# Patient Record
Sex: Male | Born: 1955
Health system: Southern US, Community
[De-identification: ages and names within clinical notes are randomized; demographics above are authoritative.]

## PROBLEM LIST (undated history)

## (undated) DIAGNOSIS — T1491XA Suicide attempt, initial encounter: Secondary | ICD-10-CM

## (undated) DIAGNOSIS — J449 Chronic obstructive pulmonary disease, unspecified: Secondary | ICD-10-CM

## (undated) DIAGNOSIS — Z91199 Patient's noncompliance with other medical treatment and regimen due to unspecified reason: Secondary | ICD-10-CM

## (undated) DIAGNOSIS — I472 Ventricular tachycardia, unspecified: Secondary | ICD-10-CM

## (undated) DIAGNOSIS — Z720 Tobacco use: Secondary | ICD-10-CM

## (undated) DIAGNOSIS — F1410 Cocaine abuse, uncomplicated: Secondary | ICD-10-CM

## (undated) DIAGNOSIS — I429 Cardiomyopathy, unspecified: Secondary | ICD-10-CM

## (undated) DIAGNOSIS — Z5900 Homelessness unspecified: Secondary | ICD-10-CM

## (undated) DIAGNOSIS — Z9119 Patient's noncompliance with other medical treatment and regimen: Secondary | ICD-10-CM

## (undated) DIAGNOSIS — M1990 Unspecified osteoarthritis, unspecified site: Secondary | ICD-10-CM

## (undated) DIAGNOSIS — I4729 Other ventricular tachycardia: Secondary | ICD-10-CM

## (undated) DIAGNOSIS — I509 Heart failure, unspecified: Secondary | ICD-10-CM

## (undated) DIAGNOSIS — F1010 Alcohol abuse, uncomplicated: Secondary | ICD-10-CM

## (undated) HISTORY — PX: INNER EAR SURGERY: SHX679

---

## 1999-02-10 ENCOUNTER — Encounter: Payer: Self-pay | Admitting: *Deleted

## 1999-02-10 ENCOUNTER — Emergency Department (HOSPITAL_COMMUNITY): Admission: EM | Admit: 1999-02-10 | Discharge: 1999-02-10 | Payer: Self-pay | Admitting: *Deleted

## 2002-05-10 ENCOUNTER — Emergency Department (HOSPITAL_COMMUNITY): Admission: EM | Admit: 2002-05-10 | Discharge: 2002-05-10 | Payer: Self-pay | Admitting: Emergency Medicine

## 2003-07-19 ENCOUNTER — Emergency Department (HOSPITAL_COMMUNITY): Admission: EM | Admit: 2003-07-19 | Discharge: 2003-07-20 | Payer: Self-pay | Admitting: Emergency Medicine

## 2003-08-28 ENCOUNTER — Emergency Department (HOSPITAL_COMMUNITY): Admission: EM | Admit: 2003-08-28 | Discharge: 2003-08-28 | Payer: Self-pay | Admitting: Emergency Medicine

## 2006-03-09 ENCOUNTER — Emergency Department (HOSPITAL_COMMUNITY): Admission: EM | Admit: 2006-03-09 | Discharge: 2006-03-09 | Payer: Self-pay | Admitting: Emergency Medicine

## 2006-05-19 ENCOUNTER — Emergency Department (HOSPITAL_COMMUNITY): Admission: EM | Admit: 2006-05-19 | Discharge: 2006-05-19 | Payer: Self-pay | Admitting: Emergency Medicine

## 2006-10-06 ENCOUNTER — Emergency Department (HOSPITAL_COMMUNITY): Admission: EM | Admit: 2006-10-06 | Discharge: 2006-10-06 | Payer: Self-pay | Admitting: Emergency Medicine

## 2007-09-03 ENCOUNTER — Observation Stay (HOSPITAL_COMMUNITY): Admission: EM | Admit: 2007-09-03 | Discharge: 2007-09-06 | Payer: Self-pay | Admitting: Emergency Medicine

## 2008-09-15 ENCOUNTER — Emergency Department (HOSPITAL_COMMUNITY): Admission: EM | Admit: 2008-09-15 | Discharge: 2008-09-15 | Payer: Self-pay | Admitting: Emergency Medicine

## 2008-12-07 ENCOUNTER — Inpatient Hospital Stay (HOSPITAL_COMMUNITY): Admission: EM | Admit: 2008-12-07 | Discharge: 2008-12-09 | Payer: Self-pay | Admitting: Emergency Medicine

## 2008-12-07 ENCOUNTER — Ambulatory Visit: Payer: Self-pay | Admitting: Family Medicine

## 2009-02-18 ENCOUNTER — Emergency Department (HOSPITAL_COMMUNITY): Admission: EM | Admit: 2009-02-18 | Discharge: 2009-02-20 | Payer: Self-pay | Admitting: Emergency Medicine

## 2010-04-18 ENCOUNTER — Inpatient Hospital Stay (HOSPITAL_COMMUNITY)
Admission: EM | Admit: 2010-04-18 | Discharge: 2010-04-21 | Payer: Self-pay | Source: Home / Self Care | Attending: Internal Medicine | Admitting: Internal Medicine

## 2010-04-25 LAB — URINALYSIS, ROUTINE W REFLEX MICROSCOPIC
Bilirubin Urine: NEGATIVE
Hgb urine dipstick: NEGATIVE
Protein, ur: NEGATIVE mg/dL
Specific Gravity, Urine: 1.018 (ref 1.005–1.030)
Urine Glucose, Fasting: 100 mg/dL — AB
Urobilinogen, UA: 1 mg/dL (ref 0.0–1.0)

## 2010-04-25 LAB — BASIC METABOLIC PANEL
BUN: 8 mg/dL (ref 6–23)
BUN: 10 mg/dL (ref 6–23)
CO2: 27 mEq/L (ref 19–32)
CO2: 29 mEq/L (ref 19–32)
CO2: 30 mEq/L (ref 19–32)
Calcium: 9.1 mg/dL (ref 8.4–10.5)
Calcium: 9.6 mg/dL (ref 8.4–10.5)
Chloride: 106 mEq/L (ref 96–112)
Chloride: 103 mEq/L (ref 96–112)
Chloride: 101 mEq/L (ref 96–112)
Creatinine, Ser: 0.84 mg/dL (ref 0.4–1.5)
Creatinine, Ser: 0.92 mg/dL (ref 0.4–1.5)
GFR calc Af Amer: >60 mL/min (ref >60)
GFR calc Af Amer: >60 mL/min (ref >60)
GFR calc Af Amer: >60 mL/min (ref >60)
GFR calc non Af Amer: >60 mL/min (ref >60)
Glucose, Bld: 151 mg/dL — ABNORMAL HIGH (ref 70–99)
Glucose, Bld: 123 mg/dL — ABNORMAL HIGH (ref 70–99)
Glucose, Bld: 133 mg/dL — ABNORMAL HIGH (ref 70–99)
Potassium: 4.4 mEq/L (ref 3.5–5.1)
Potassium: 4 mEq/L (ref 3.5–5.1)
Potassium: 3.9 mEq/L (ref 3.5–5.1)
Sodium: 142 mEq/L (ref 135–145)
Sodium: 141 mEq/L (ref 135–145)

## 2010-04-25 LAB — POCT CARDIAC MARKERS
CKMB, poc: 2.4 ng/mL (ref 1.0–8.0)
Myoglobin, poc: 60.4 ng/mL (ref 12–200)
Troponin i, poc: <0.05 ng/mL (ref 0.00–0.09)

## 2010-04-25 LAB — BLOOD GAS, ARTERIAL
Acid-Base Excess: 2.4 mmol/L — ABNORMAL HIGH (ref 0.0–2.0)
Bicarbonate: 27.2 mEq/L — ABNORMAL HIGH (ref 20.0–24.0)
Drawn by: 30996
O2 Content: 3 L/min
O2 Saturation: 92.3 %
Patient temperature: 98.6
TCO2: 23.6 mmol/L (ref 0–100)
pCO2 arterial: 44.8 mmHg (ref 35.0–45.0)
pH, Arterial: 7.4 (ref 7.350–7.450)
pO2, Arterial: 59.2 mmHg — ABNORMAL LOW (ref 80.0–100.0)

## 2010-04-25 LAB — COMPREHENSIVE METABOLIC PANEL
ALT: 60 U/L — ABNORMAL HIGH (ref 0–53)
AST: 52 U/L — ABNORMAL HIGH (ref 0–37)
Albumin: 3.8 g/dL (ref 3.5–5.2)
Alkaline Phosphatase: 33 U/L — ABNORMAL LOW (ref 39–117)
CO2: 28 mEq/L (ref 19–32)
Calcium: 9.2 mg/dL (ref 8.4–10.5)
Chloride: 106 mEq/L (ref 96–112)
Creatinine, Ser: 0.81 mg/dL (ref 0.4–1.5)
Potassium: 4.3 mEq/L (ref 3.5–5.1)
Sodium: 147 mEq/L — ABNORMAL HIGH (ref 135–145)
Total Bilirubin: 0.4 mg/dL (ref 0.3–1.2)
Total Protein: 6.8 g/dL (ref 6.0–8.3)

## 2010-04-25 LAB — CARDIAC PANEL(CRET KIN+CKTOT+MB+TROPI)
CK, MB: 2.8 ng/mL (ref 0.3–4.0)
CK, MB: 2.3 ng/mL (ref 0.3–4.0)
Relative Index: 2.5 (ref 0.0–2.5)
Relative Index: 2.3 (ref 0.0–2.5)
Total CK: 114 U/L (ref 7–232)
Total CK: 100 U/L (ref 7–232)
Troponin I: 0.01 ng/mL (ref 0.00–0.06)
Troponin I: 0.02 ng/mL (ref 0.00–0.06)

## 2010-04-25 LAB — CBC
HCT: 47.6 % (ref 39.0–52.0)
HCT: 44.6 % (ref 39.0–52.0)
Hemoglobin: 15.8 g/dL (ref 13.0–17.0)
Hemoglobin: 14.6 g/dL (ref 13.0–17.0)
MCH: 33.3 pg (ref 26.0–34.0)
MCH: 32.7 pg (ref 26.0–34.0)
MCHC: 33.2 g/dL (ref 30.0–36.0)
MCHC: 32.7 g/dL (ref 30.0–36.0)
MCV: 100.2 fL — ABNORMAL HIGH (ref 78.0–100.0)
MCV: 99.8 fL (ref 78.0–100.0)
Platelets: 166 10*3/uL (ref 150–400)
Platelets: 165 10*3/uL (ref 150–400)
RBC: 4.75 MIL/uL (ref 4.22–5.81)
RBC: 4.47 MIL/uL (ref 4.22–5.81)
RDW: 12.7 % (ref 11.5–15.5)
RDW: 12.6 % (ref 11.5–15.5)
WBC: 5.5 10*3/uL (ref 4.0–10.5)

## 2010-04-25 LAB — DIFFERENTIAL
Basophils Absolute: 0.1 10*3/uL (ref 0.0–0.1)
Basophils Relative: 1 % (ref 0–1)
Eosinophils Absolute: 0.1 10*3/uL (ref 0.0–0.7)
Eosinophils Relative: 1 % (ref 0–5)
Lymphocytes Relative: 29 % (ref 12–46)
Lymphs Abs: 1.6 10*3/uL (ref 0.7–4.0)
Monocytes Absolute: 0.4 10*3/uL (ref 0.1–1.0)
Monocytes Relative: 7 % (ref 3–12)
Neutro Abs: 3.4 10*3/uL (ref 1.7–7.7)
Neutrophils Relative %: 62 % (ref 43–77)

## 2010-04-25 LAB — CK TOTAL AND CKMB (NOT AT ARMC)
CK, MB: 3.7 ng/mL (ref 0.3–4.0)
CK, MB: 2.7 ng/mL (ref 0.3–4.0)
Relative Index: 2.6 — ABNORMAL HIGH (ref 0.0–2.5)
Relative Index: 2.4 (ref 0.0–2.5)
Total CK: 114 U/L (ref 7–232)

## 2010-04-25 LAB — TROPONIN I
Troponin I: 0.02 ng/mL (ref 0.00–0.06)
Troponin I: 0.02 ng/mL (ref 0.00–0.06)

## 2010-04-30 NOTE — Progress Notes (Addendum)
  NAME:  Brian [name], Brian [name] NO.:  [id number]  MEDICAL RECORD NO.:  [id number]          PATIENT TYPE:  INP  LOCATION:  1423                         FACILITY:  [facility]  PHYSICIAN:  Homero Fellers, MD   DATE OF BIRTH:  1956/01/22                                PROGRESS NOTE   CHIEF COMPLAINT: Shortness of breath is improved.  No chest pain.  SUBJECTIVE: In summary, this is a 55 year old man who was admitted yesterday with COPD exacerbation, toxic respiratory failure and impending alcohol withdrawal.  Today, the patient does not have any worsening of his tremors.  No hallucination, nausea or vomiting.  OBJECTIVE: VITAL SIGNS:  His vital signs showed blood pressure of 164/93, pulse 102, respirations 18, temperature is 98.6.  O2 sat is 96% on 2 L. GENERAL:  The patient seems comfortable, in no distress. HEENT:  Pallor.  Extraocular muscles are intact. NECK:  Supple.  No JVD, adenopathy or thyromegaly. LUNGS:  Showed reduced breath sounds bilaterally with very minimal wheezing.  S1, S2.  No murmur, rubs, or gallop. ABDOMEN:  Obese, soft, nontender.  Bowel sounds present.  No masses. EXTREMITIES:  No edema.  LABORATORY DATA: White count is 7.9, hemoglobin 14.6, platelet count is 165. Chemistries; sodium is 141, potassium 4, BUN 11, creatinine 0.84. Cardiac panel negative x3.  Chest x-ray this morning showed increase opacity at the right lung base which may suggest pneumonia or aspiration pneumonitis with increase linear atelectasis at the left lung base.  ASSESSMENT: 1. Chronic obstructive pulmonary disease exacerbation versus right     lower lobe pneumonia which could be community acquired or     aspiration pneumonitis.  The patient is already on antibiotics     which should be continued. 2. Alcohol abuse with no overt withdrawal at this time.  He is on CIWA     protocol.  His blood pressure is mildly elevated as well as his     heart rate but he does have  worsening of his tremor. 3. Chest pain which is resolved, likely related to pneumonitis.  PLAN: Plan is to continue on antibiotics and steroids if oxygen saturation is acceptable.  If he continues to improve, he can probably be discharged home in 1 to 2 days on p.o. antibiotics for at least 5 additional days.  Meanwhile, he is continued on CIWA protocol at this time.  His condition is stable.     Homero Fellers, MD     FA/MEDQ  D:  04/19/2010  T:  04/20/2010  Job:  [id number]  Electronically Signed by Homero Fellers  on 04/30/2010 10:52:39 PM

## 2010-05-10 ENCOUNTER — Emergency Department (HOSPITAL_COMMUNITY)
Admission: EM | Admit: 2010-05-10 | Discharge: 2010-05-10 | Payer: Self-pay | Source: Home / Self Care | Admitting: Emergency Medicine

## 2010-05-10 LAB — RAPID URINE DRUG SCREEN, HOSP PERFORMED
Cocaine: NOT DETECTED
Tetrahydrocannabinol: NOT DETECTED

## 2010-05-10 LAB — CBC
MCV: 95 fL (ref 78.0–100.0)
Platelets: 221 10*3/uL (ref 150–400)
RBC: 4.64 MIL/uL (ref 4.22–5.81)
WBC: 6.9 10*3/uL (ref 4.0–10.5)

## 2010-05-10 LAB — DIFFERENTIAL
Basophils Absolute: 0 10*3/uL (ref 0.0–0.1)
Eosinophils Absolute: 0.1 10*3/uL (ref 0.0–0.7)
Lymphs Abs: 2.8 10*3/uL (ref 0.7–4.0)
Neutrophils Relative %: 51 % (ref 43–77)

## 2010-05-11 ENCOUNTER — Inpatient Hospital Stay (HOSPITAL_COMMUNITY)
Admission: AD | Admit: 2010-05-11 | Discharge: 2010-05-12 | DRG: 897 | Disposition: A | Discharge status: Home / Self Care | Payer: PRIVATE HEALTH INSURANCE | Source: Ambulatory Visit | Attending: Psychiatry | Admitting: Psychiatry

## 2010-05-11 DIAGNOSIS — F1020 Alcohol dependence, uncomplicated: Principal | ICD-10-CM

## 2010-05-11 DIAGNOSIS — F1920 Other psychoactive substance dependence, uncomplicated: Secondary | ICD-10-CM

## 2010-05-11 DIAGNOSIS — F1220 Cannabis dependence, uncomplicated: Secondary | ICD-10-CM

## 2010-05-11 DIAGNOSIS — F1420 Cocaine dependence, uncomplicated: Secondary | ICD-10-CM

## 2010-05-11 DIAGNOSIS — F1910 Other psychoactive substance abuse, uncomplicated: Secondary | ICD-10-CM

## 2010-05-11 DIAGNOSIS — Z5900 Homelessness unspecified: Secondary | ICD-10-CM

## 2010-05-11 DIAGNOSIS — Z560 Unemployment, unspecified: Secondary | ICD-10-CM

## 2010-05-12 ENCOUNTER — Emergency Department (HOSPITAL_COMMUNITY)
Admission: EM | Admit: 2010-05-12 | Discharge: 2010-05-13 | Disposition: A | Discharge status: Home / Self Care | Payer: PRIVATE HEALTH INSURANCE | Attending: Emergency Medicine | Admitting: Emergency Medicine

## 2010-05-12 ENCOUNTER — Emergency Department (HOSPITAL_COMMUNITY): Payer: PRIVATE HEALTH INSURANCE

## 2010-05-12 ENCOUNTER — Emergency Department (HOSPITAL_COMMUNITY): Admit: 2010-05-12 | Payer: PRIVATE HEALTH INSURANCE

## 2010-05-12 DIAGNOSIS — Z5900 Homelessness unspecified: Secondary | ICD-10-CM | POA: Insufficient documentation

## 2010-05-12 DIAGNOSIS — Z046 Encounter for general psychiatric examination, requested by authority: Secondary | ICD-10-CM | POA: Insufficient documentation

## 2010-05-12 DIAGNOSIS — W1800XA Striking against unspecified object with subsequent fall, initial encounter: Secondary | ICD-10-CM

## 2010-05-12 DIAGNOSIS — W1809XA Striking against other object with subsequent fall, initial encounter: Secondary | ICD-10-CM | POA: Insufficient documentation

## 2010-05-12 DIAGNOSIS — S59909A Unspecified injury of unspecified elbow, initial encounter: Secondary | ICD-10-CM | POA: Insufficient documentation

## 2010-05-12 DIAGNOSIS — M25539 Pain in unspecified wrist: Secondary | ICD-10-CM | POA: Insufficient documentation

## 2010-05-12 DIAGNOSIS — S6990XA Unspecified injury of unspecified wrist, hand and finger(s), initial encounter: Secondary | ICD-10-CM | POA: Insufficient documentation

## 2010-05-12 LAB — CBC
HCT: 48 % (ref 39.0–52.0)
MCH: 32.9 pg (ref 26.0–34.0)
MCV: 95.8 fL (ref 78.0–100.0)
Platelets: 200 10*3/uL (ref 150–400)
RDW: 12.7 % (ref 11.5–15.5)

## 2010-05-12 LAB — URINALYSIS, ROUTINE W REFLEX MICROSCOPIC
Nitrite: NEGATIVE
Specific Gravity, Urine: 1.006 (ref 1.005–1.030)
pH: 5.5 (ref 5.0–8.0)

## 2010-05-12 LAB — RAPID URINE DRUG SCREEN, HOSP PERFORMED
Barbiturates: NOT DETECTED
Cocaine: NOT DETECTED
Opiates: NOT DETECTED

## 2010-05-12 LAB — COMPREHENSIVE METABOLIC PANEL
ALT: 43 U/L (ref 0–53)
AST: 31 U/L (ref 0–37)
Albumin: 4.2 g/dL (ref 3.5–5.2)
CO2: 26 mEq/L (ref 19–32)
Chloride: 105 mEq/L (ref 96–112)
GFR calc Af Amer: >60 mL/min (ref >60)
GFR calc non Af Amer: >60 mL/min (ref >60)
Sodium: 140 mEq/L (ref 135–145)
Total Bilirubin: 0.6 mg/dL (ref 0.3–1.2)

## 2010-05-12 LAB — DIFFERENTIAL
Eosinophils Absolute: 0.1 10*3/uL (ref 0.0–0.7)
Eosinophils Relative: 2 % (ref 0–5)
Lymphocytes Relative: 34 % (ref 12–46)
Lymphs Abs: 2.1 10*3/uL (ref 0.7–4.0)
Monocytes Relative: 12 % (ref 3–12)

## 2010-05-12 LAB — ETHANOL: Alcohol, Ethyl (B): 203 mg/dL — ABNORMAL HIGH (ref 0–10)

## 2010-05-13 ENCOUNTER — Emergency Department (HOSPITAL_COMMUNITY): Payer: PRIVATE HEALTH INSURANCE
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
## 2010-07-13 LAB — URINALYSIS, ROUTINE W REFLEX MICROSCOPIC
Glucose, UA: NEGATIVE mg/dL
Nitrite: NEGATIVE
Specific Gravity, Urine: 1.009 (ref 1.005–1.030)
pH: 5 (ref 5.0–8.0)

## 2010-07-13 LAB — COMPREHENSIVE METABOLIC PANEL
ALT: 36 U/L (ref 0–53)
Calcium: 9.5 mg/dL (ref 8.4–10.5)
Creatinine, Ser: 0.98 mg/dL (ref 0.4–1.5)
GFR calc Af Amer: >60 mL/min (ref >60)
GFR calc non Af Amer: >60 mL/min (ref >60)
Glucose, Bld: 93 mg/dL (ref 70–99)
Sodium: 144 mEq/L (ref 135–145)
Total Protein: 7.2 g/dL (ref 6.0–8.3)

## 2010-07-13 LAB — LIPASE, BLOOD: Lipase: 27 U/L (ref 11–59)

## 2010-07-13 LAB — RAPID URINE DRUG SCREEN, HOSP PERFORMED
Barbiturates: NOT DETECTED
Benzodiazepines: NOT DETECTED
Cocaine: POSITIVE — AB
Opiates: NOT DETECTED
Tetrahydrocannabinol: NOT DETECTED

## 2010-07-13 LAB — CBC
Hemoglobin: 16.4 g/dL (ref 13.0–17.0)
MCHC: 35.2 g/dL (ref 30.0–36.0)
MCV: 94.5 fL (ref 78.0–100.0)
RDW: 13.3 % (ref 11.5–15.5)

## 2010-07-13 LAB — DIFFERENTIAL
Eosinophils Absolute: 0.1 10*3/uL (ref 0.0–0.7)
Lymphocytes Relative: 34 % (ref 12–46)
Lymphs Abs: 2.3 10*3/uL (ref 0.7–4.0)
Monocytes Relative: 7 % (ref 3–12)
Neutro Abs: 3.9 10*3/uL (ref 1.7–7.7)
Neutrophils Relative %: 57 % (ref 43–77)

## 2010-07-13 LAB — ETHANOL: Alcohol, Ethyl (B): <5 mg/dL (ref 0–10)

## 2010-07-16 LAB — COMPREHENSIVE METABOLIC PANEL
ALT: 32 U/L (ref 0–53)
AST: 27 U/L (ref 0–37)
Albumin: 4.1 g/dL (ref 3.5–5.2)
Alkaline Phosphatase: 35 U/L — ABNORMAL LOW (ref 39–117)
Alkaline Phosphatase: 36 U/L — ABNORMAL LOW (ref 39–117)
BUN: 24 mg/dL — ABNORMAL HIGH (ref 6–23)
CO2: 22 mEq/L (ref 19–32)
Chloride: 100 mEq/L (ref 96–112)
Chloride: 103 mEq/L (ref 96–112)
Creatinine, Ser: 1.11 mg/dL (ref 0.4–1.5)
GFR calc Af Amer: >60 mL/min (ref >60)
GFR calc non Af Amer: >60 mL/min (ref >60)
Potassium: 4.5 mEq/L (ref 3.5–5.1)
Total Bilirubin: 0.7 mg/dL (ref 0.3–1.2)
Total Bilirubin: 1.5 mg/dL — ABNORMAL HIGH (ref 0.3–1.2)

## 2010-07-16 LAB — CARDIAC PANEL(CRET KIN+CKTOT+MB+TROPI)
CK, MB: 3.3 ng/mL (ref 0.3–4.0)
Relative Index: 1.2 (ref 0.0–2.5)
Relative Index: 1.3 (ref 0.0–2.5)
Total CK: 256 U/L — ABNORMAL HIGH (ref 7–232)
Troponin I: 0.01 ng/mL (ref 0.00–0.06)
Troponin I: 0.02 ng/mL (ref 0.00–0.06)

## 2010-07-16 LAB — DIFFERENTIAL
Basophils Absolute: 0 10*3/uL (ref 0.0–0.1)
Basophils Absolute: 0.1 10*3/uL (ref 0.0–0.1)
Basophils Relative: 1 % (ref 0–1)
Basophils Relative: 1 % (ref 0–1)
Eosinophils Absolute: 0 10*3/uL (ref 0.0–0.7)
Eosinophils Absolute: 0.2 10*3/uL (ref 0.0–0.7)
Eosinophils Relative: 0 % (ref 0–5)
Eosinophils Relative: 2 % (ref 0–5)
Lymphocytes Relative: 24 % (ref 12–46)
Monocytes Absolute: 1 10*3/uL (ref 0.1–1.0)
Neutro Abs: 5.6 10*3/uL (ref 1.7–7.7)

## 2010-07-16 LAB — CBC
HCT: 46.6 % (ref 39.0–52.0)
HCT: 49.5 % (ref 39.0–52.0)
Hemoglobin: 16 g/dL (ref 13.0–17.0)
MCV: 95.8 fL (ref 78.0–100.0)
Platelets: 181 10*3/uL (ref 150–400)
RBC: 5.17 MIL/uL (ref 4.22–5.81)
WBC: 10.4 10*3/uL (ref 4.0–10.5)
WBC: 9.5 10*3/uL (ref 4.0–10.5)

## 2010-07-16 LAB — RAPID URINE DRUG SCREEN, HOSP PERFORMED
Amphetamines: NOT DETECTED
Barbiturates: NOT DETECTED

## 2010-07-16 LAB — D-DIMER, QUANTITATIVE: D-Dimer, Quant: 0.28 ug/mL-FEU (ref 0.00–0.48)

## 2010-07-16 LAB — POCT CARDIAC MARKERS
CKMB, poc: 2.9 ng/mL (ref 1.0–8.0)
Myoglobin, poc: 152 ng/mL (ref 12–200)
Troponin i, poc: <0.05 ng/mL (ref 0.00–0.09)
Troponin i, poc: <0.05 ng/mL (ref 0.00–0.09)

## 2010-07-16 LAB — LIPID PANEL: Triglycerides: 65 mg/dL (ref <150)

## 2010-07-16 LAB — APTT: aPTT: 29 seconds (ref 24–37)

## 2010-08-23 NOTE — Discharge Summary (Signed)
NAME:  Brian [name], ARPINO NO.:  [id number]   MEDICAL RECORD NO.:  [id number]          PATIENT TYPE:  OBV   LOCATION:  5511                         FACILITY:  [facility]   PHYSICIAN:  Leighton Roach McDiarmid, M.D.DATE OF BIRTH:  05-03-1955   DATE OF ADMISSION:  12/07/2008  DATE OF DISCHARGE:  12/09/2008                               DISCHARGE SUMMARY   PRIMARY CARE PHYSICIAN:  Unassigned.   REASON FOR ADMISSION:  Chest pain.   PERTINENT ADMIT LABS:  CMET 138, potassium 3.6, chloride 103, bicarb 22,  BUN 28, creatinine 1.11, and glucose 122.  Point-of-care cardiac enzymes  negative x2.  Urine drug screen, positive for cocaine.   PRIMARY DISCHARGE DIAGNOSES:  1. Cocaine Associated Chest Pain  2. Cocaine abuse.  3. Alcohol Dependence, continuous   HOME MEDICATIONS:  None.   NEW MEDICATIONS AT THE TIME OF DISCHARGE:  None.   PROBLEMS:  1. Chest pain.  The patient describes chest pain is soreness.  Able to      reproduce pain with palpation of area.  On day of discharge, pain      continued in left chest and left upper back.  EKGs within normal      limits on August 31, no evidence of MI.  Cardiac enzymes x3, during      admission, within normal limits.  Atypical chest pain, most likely      secondary to musculoskeletal injury.  No further workup needed for      chest pain at this time.  2. ETOH and cocaine abuse.  Social work consult obtained to help with      these issues, and discussed the importance of no longer using ETOH      or cocaine.  Admitting MD also discussed concerns and the need to      stop the use of these substances, with the patient.  3. Social issues.  The patient also homeless.  Social work also      consulted to give the patient numbers of places      that are safe to go for shelter.  4. On day of discharge, the patient refused to wait for discharge      paperwork to be completed and left the hospital AMA.  Unable to      establish the patient with  primary care physician or go over      discharge paperwork.      Ellin Mayhew, MD  Electronically Signed      Leighton Roach McDiarmid, M.D.  Electronically Signed    DC/MEDQ  D:  12/09/2008  T:  12/10/2008  Job:  [id number]
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
## 2010-11-11 ENCOUNTER — Emergency Department (HOSPITAL_COMMUNITY)
Admission: EM | Admit: 2010-11-11 | Discharge: 2010-11-12 | Disposition: A | Discharge status: Home / Self Care | Payer: Self-pay | Attending: Emergency Medicine | Admitting: Emergency Medicine

## 2010-11-11 DIAGNOSIS — F3289 Other specified depressive episodes: Secondary | ICD-10-CM | POA: Insufficient documentation

## 2010-11-11 DIAGNOSIS — F329 Major depressive disorder, single episode, unspecified: Secondary | ICD-10-CM | POA: Insufficient documentation

## 2010-11-11 DIAGNOSIS — F1010 Alcohol abuse, uncomplicated: Secondary | ICD-10-CM | POA: Insufficient documentation

## 2010-11-11 LAB — COMPREHENSIVE METABOLIC PANEL
ALT: 41 U/L (ref 0–53)
AST: 38 U/L — ABNORMAL HIGH (ref 0–37)
CO2: 27 mEq/L (ref 19–32)
Calcium: 9.6 mg/dL (ref 8.4–10.5)
Chloride: 100 mEq/L (ref 96–112)
GFR calc Af Amer: >60 mL/min (ref >60)
GFR calc non Af Amer: >60 mL/min (ref >60)
Glucose, Bld: 100 mg/dL — ABNORMAL HIGH (ref 70–99)
Sodium: 140 mEq/L (ref 135–145)
Total Bilirubin: 0.3 mg/dL (ref 0.3–1.2)

## 2010-11-11 LAB — RAPID URINE DRUG SCREEN, HOSP PERFORMED
Barbiturates: NOT DETECTED
Benzodiazepines: NOT DETECTED
Cocaine: POSITIVE — AB

## 2010-11-11 LAB — CBC
MCHC: 35.4 g/dL (ref 30.0–36.0)
MCV: 91.4 fL (ref 78.0–100.0)
Platelets: 208 10*3/uL (ref 150–400)
RDW: 13.1 % (ref 11.5–15.5)
WBC: 7.2 10*3/uL (ref 4.0–10.5)

## 2010-11-11 LAB — URINALYSIS, ROUTINE W REFLEX MICROSCOPIC
Bilirubin Urine: NEGATIVE
Ketones, ur: NEGATIVE mg/dL
Leukocytes, UA: NEGATIVE
Nitrite: NEGATIVE
Protein, ur: NEGATIVE mg/dL

## 2010-11-11 LAB — DIFFERENTIAL
Basophils Absolute: 0 10*3/uL (ref 0.0–0.1)
Eosinophils Absolute: 0.1 10*3/uL (ref 0.0–0.7)
Eosinophils Relative: 1 % (ref 0–5)
Monocytes Absolute: 0.5 10*3/uL (ref 0.1–1.0)

## 2011-01-04 LAB — URINE MICROSCOPIC-ADD ON

## 2011-01-04 LAB — URINALYSIS, ROUTINE W REFLEX MICROSCOPIC
Bilirubin Urine: NEGATIVE
Glucose, UA: NEGATIVE
Ketones, ur: NEGATIVE
Protein, ur: 100 — AB
Urobilinogen, UA: 0.2

## 2011-01-04 LAB — POCT I-STAT, CHEM 8
BUN: 15
Chloride: 110
Creatinine, Ser: 1.2
Potassium: 4.9
Sodium: 144
TCO2: 23

## 2011-01-04 LAB — CBC
HCT: 47.1
Hemoglobin: 16.1
RBC: 4.99
RDW: 13.5
WBC: 19.8 — ABNORMAL HIGH

## 2011-01-04 LAB — PROTIME-INR: INR: 0.9

## 2011-01-24 ENCOUNTER — Emergency Department (HOSPITAL_COMMUNITY): Payer: Self-pay

## 2011-01-24 ENCOUNTER — Observation Stay (HOSPITAL_COMMUNITY)
Admission: EM | Admit: 2011-01-24 | Discharge: 2011-01-26 | Discharge status: Against Medical Advice | Payer: Self-pay | Attending: Internal Medicine | Admitting: Internal Medicine

## 2011-01-24 DIAGNOSIS — Z5900 Homelessness unspecified: Secondary | ICD-10-CM | POA: Insufficient documentation

## 2011-01-24 DIAGNOSIS — F17200 Nicotine dependence, unspecified, uncomplicated: Secondary | ICD-10-CM | POA: Insufficient documentation

## 2011-01-24 DIAGNOSIS — R0789 Other chest pain: Principal | ICD-10-CM | POA: Insufficient documentation

## 2011-01-24 DIAGNOSIS — F1010 Alcohol abuse, uncomplicated: Secondary | ICD-10-CM | POA: Insufficient documentation

## 2011-01-24 DIAGNOSIS — F1420 Cocaine dependence, uncomplicated: Secondary | ICD-10-CM | POA: Insufficient documentation

## 2011-01-24 LAB — DIFFERENTIAL
Basophils Relative: 0 % (ref 0–1)
Eosinophils Absolute: 0.1 10*3/uL (ref 0.0–0.7)
Lymphs Abs: 3.2 10*3/uL (ref 0.7–4.0)
Neutro Abs: 5.5 10*3/uL (ref 1.7–7.7)
Neutrophils Relative %: 58 % (ref 43–77)

## 2011-01-24 LAB — URINALYSIS, ROUTINE W REFLEX MICROSCOPIC
Glucose, UA: NEGATIVE mg/dL
Hgb urine dipstick: NEGATIVE
Leukocytes, UA: NEGATIVE
Protein, ur: NEGATIVE mg/dL
Specific Gravity, Urine: 1.004 — ABNORMAL LOW (ref 1.005–1.030)
Urobilinogen, UA: 0.2 mg/dL (ref 0.0–1.0)

## 2011-01-24 LAB — BASIC METABOLIC PANEL
BUN: 14 mg/dL (ref 6–23)
CO2: 23 mEq/L (ref 19–32)
Calcium: 9.4 mg/dL (ref 8.4–10.5)
Chloride: 102 mEq/L (ref 96–112)
Creatinine, Ser: 0.76 mg/dL (ref 0.50–1.35)
Glucose, Bld: 91 mg/dL (ref 70–99)

## 2011-01-24 LAB — CBC
Hemoglobin: 16.9 g/dL (ref 13.0–17.0)
Platelets: 207 10*3/uL (ref 150–400)
RBC: 5.14 MIL/uL (ref 4.22–5.81)
WBC: 9.4 10*3/uL (ref 4.0–10.5)

## 2011-01-24 LAB — POCT I-STAT TROPONIN I: Troponin i, poc: 0.01 ng/mL (ref 0.00–0.08)

## 2011-01-24 LAB — PROTIME-INR
INR: 0.89 (ref 0.00–1.49)
Prothrombin Time: 12.2 seconds (ref 11.6–15.2)

## 2011-01-24 LAB — RAPID URINE DRUG SCREEN, HOSP PERFORMED: Opiates: NOT DETECTED

## 2011-01-25 DIAGNOSIS — R072 Precordial pain: Secondary | ICD-10-CM

## 2011-01-25 LAB — COMPREHENSIVE METABOLIC PANEL
ALT: 31
AST: 22 U/L (ref 0–37)
Albumin: 3.9 g/dL (ref 3.5–5.2)
Alkaline Phosphatase: 36 U/L — ABNORMAL LOW (ref 39–117)
Alkaline Phosphatase: 36 — ABNORMAL LOW
BUN: 13 mg/dL (ref 6–23)
BUN: 12
CO2: 24 mEq/L (ref 19–32)
CO2: 24
Chloride: 107 mEq/L (ref 96–112)
Chloride: 105
Creatinine, Ser: 0.73 mg/dL (ref 0.50–1.35)
GFR calc non Af Amer: >90 mL/min (ref >90)
GFR calc non Af Amer: >60
Glucose, Bld: 78
Potassium: 4.3 mEq/L (ref 3.5–5.1)
Potassium: 4.6
Sodium: 140
Total Bilirubin: 0.3 mg/dL (ref 0.3–1.2)
Total Bilirubin: 1.3 — ABNORMAL HIGH
Total Protein: 6.9

## 2011-01-25 LAB — LIPID PANEL
Cholesterol: 156 mg/dL (ref 0–200)
LDL Cholesterol: 78 mg/dL (ref 0–99)
Triglycerides: 177 mg/dL — ABNORMAL HIGH (ref <150)
VLDL: 35 mg/dL (ref 0–40)

## 2011-01-25 LAB — DIFFERENTIAL
Basophils Absolute: 0
Basophils Relative: 1
Eosinophils Absolute: 0.1
Monocytes Relative: 7
Neutrophils Relative %: 65

## 2011-01-25 LAB — RAPID URINE DRUG SCREEN, HOSP PERFORMED
Opiates: NOT DETECTED
Tetrahydrocannabinol: POSITIVE — AB

## 2011-01-25 LAB — CBC
HCT: 51
Hemoglobin: 15.9 g/dL (ref 13.0–17.0)
Hemoglobin: 17.8 — ABNORMAL HIGH
MCH: 31.5 pg (ref 26.0–34.0)
MCV: 92.3 fL (ref 78.0–100.0)
Platelets: 187 10*3/uL (ref 150–400)
RBC: 5.05 MIL/uL (ref 4.22–5.81)
RBC: 5.62
WBC: 6.8 10*3/uL (ref 4.0–10.5)
WBC: 7.6

## 2011-01-25 LAB — CARDIAC PANEL(CRET KIN+CKTOT+MB+TROPI)
CK, MB: 4.1 ng/mL — ABNORMAL HIGH (ref 0.3–4.0)
CK, MB: 3.4 ng/mL (ref 0.3–4.0)
Relative Index: 2.2 (ref 0.0–2.5)
Total CK: 182 U/L (ref 7–232)
Total CK: 154 U/L (ref 7–232)
Troponin I: <0.3 ng/mL (ref <0.30)

## 2011-01-25 LAB — PHOSPHORUS: Phosphorus: 2.9 mg/dL (ref 2.3–4.6)

## 2011-01-25 LAB — ETHANOL: Alcohol, Ethyl (B): 158 — ABNORMAL HIGH

## 2011-01-26 LAB — BASIC METABOLIC PANEL
Chloride: 104 mEq/L (ref 96–112)
GFR calc non Af Amer: >90 mL/min (ref >90)
Sodium: 140 mEq/L (ref 135–145)

## 2011-01-26 LAB — CBC
MCH: 32 pg (ref 26.0–34.0)
MCV: 91.7 fL (ref 78.0–100.0)
Platelets: 179 10*3/uL (ref 150–400)
RDW: 12.5 % (ref 11.5–15.5)
WBC: 6.7 10*3/uL (ref 4.0–10.5)

## 2011-01-27 LAB — HEMOGLOBIN A1C
Hgb A1c MFr Bld: 5.6 % (ref <5.7)
Mean Plasma Glucose: 114 mg/dL (ref <117)

## 2011-01-31 NOTE — Discharge Summary (Signed)
  NAMECALEL, PISARSKI NO.:  [id number]  MEDICAL RECORD NO.:  [id number]  LOCATION:  5123                         FACILITY:  [facility]  PHYSICIAN:  Lonia Blood, M.D.       DATE OF BIRTH:  [birth year]  DATE OF ADMISSION:  01/24/2011 DATE OF DISCHARGE:  01/26/2011                              DISCHARGE SUMMARY   DISCHARGE DIAGNOSES: 1. Chest pain - atypical - the patient ruled out for myocardial     infarction. 2. Alcohol abuse.  DISCHARGE MEDICATIONS:  No medications could be prescribed as the patient left against medical advice.  HOSPITAL COURSE:  Mr. Bruna is a 55 year old gentleman with history of heavy alcohol abuse came to emergency room complaining of some left shoulder pain and chest pain.  His EKG was nonacute.  The patient was observed overnight on telemetry and his cardiac enzymes were normal.  He was then asked about his interest in stopping alcohol.  The patient voiced a strong desire to quit drinking.  On day #2 of his alcohol detoxification protocol, he decided that he cannot take the torture anymore and he left the hospital.  He was at the time of his leaving the hospital alert and oriented, and he understood the consequences of his decision.     Lonia Blood, M.D.     SL/MEDQ  D:  01/29/2011  T:  01/29/2011  Job:  [id number]  Electronically Signed by Lonia Blood M.D. on 01/31/2011 07:40:21 AM

## 2011-02-10 NOTE — H&P (Signed)
**Note Brian via Obfuscation** [name], BEGUE NO.:  [id number]  MEDICAL RECORD NO.:  [id number]  LOCATION:  3707                         FACILITY:  [facility]  PHYSICIAN:  Lonia Blood, M.D.      DATE OF BIRTH:  12-25-1955  DATE OF ADMISSION:  01/24/2011 DATE OF DISCHARGE:                             HISTORY & PHYSICAL   PRIMARY CARE PHYSICIAN:  He is unassigned.  PRESENTING COMPLAINT:  Chest pain.  HISTORY OF PRESENT ILLNESS:  [name] who is alcoholic and homeless.  The patient was brought in to the ED complaining of chest pain.  He has inebriated and intoxicated with alcohol.  The patient reported doing cocaine yesterday, but his urine drug screen so far is negative here.  He complaining of left-sided chest pain that he rated as 9/10, radiating down his left arm.  Denied any relieving or aggravating factor.  He had some nausea and vomiting today and feels short of breath.  No cough.  No fever.  [name].  [place]  [name], although no records quite clear are available to [place] at this point.  He did have psychiatric evaluation in the system.  PAST MEDICAL HISTORY:  Significant for depression, suicide attempt, polysubstance abuse, and asthma.  ALLERGIES:  No known drug allergies.  MEDICATIONS:  None.  SOCIAL HISTORY:  [name].  He uses cocaine, heavy alcohol, heavy smoker.  Denied IV drug use.  FAMILY HISTORY:  Denied any significant family history including coronary artery disease.  REVIEW OF SYSTEMS:  All systems are negative except per HPI.  PHYSICAL EXAMINATION:  VITAL SIGNS:  Temperature 98.7, blood pressure 108/79, pulse is 97, respiratory rate of 17, O2 sats 95% on room air. GENERAL:  [name], but in no acute distress. HEENT:  PERRL.  EOMI.  No  significant pallor.  No jaundice.  No rhinorrhea. NECK:  Supple.  No JVD.  No lymphadenopathy. RESPIRATORY:  He has good air entry bilaterally.  No wheezes, no rales, no crackles. CARDIOVASCULAR:  He has S1 and S2.  No murmur. ABDOMEN:  Soft, full, nontender with positive bowel sounds. EXTREMITIES:  No edema, cyanosis, or clubbing. SKIN:  No rashes, no ulcers.  LABORATORY DATA:  White count 9.4, hemoglobin 16.9 with platelet of 207, normal differentials.  Urine drug screen is negative.  Urinalysis is essentially negative.  Sodium 138, potassium 4.0, chloride 102, CO2 of 23, glucose 91, BUN 14, creatinine 0.76, with calcium 9.4.  His alcohol level is 240.  Chest x-ray showed no acute cardiopulmonary process. There is bilateral mid and lower lung linear heterogeneous opacities, which is consistent with subsegmental atelectasis or scar.  His EKG showed normal sinus rhythm with nonspecific ST-T wave changes, rate of 97.  ASSESSMENT:  This is a 55 year old gentleman here with alcohol intoxication, chest pain with some risk factors for possible coronary artery disease.  PLAN: 1. Chest pain.  Admit the patient for observation, check serial     cardiac enzymes.  Given some nitro  as needed.  The most important     thing is worried about possible cocaine-induced chest pain,     however, his urine drug screen so far is negative for cocaine.  If     enzymes are all negative, the patient is probably at low to medium     risk and may not even require stress test or less will get more     records indicating that he truly had prior chest pain or coronary     artery disease. 2. Alcohol intoxication.  We will watch for possible DT.  In the     meantime, I will put him on Ativan empirically, thiamine, folic     acid, and watch for DT. 3. Polysubstance abuse including tobacco.  I will put him on nicotine     patch.  The patient will need some tobacco cessation counseling. 4. History of asthma.  He  has no wheezing and has not been using any     inhalers.  We will empirically have nebulizers p.r.n. in the     hospital. 5. Depression and anxiety.  Again, the patient is not taking any     medicine largely because he is homeless.  If needed, we will get     Psychiatry to evaluate the patient if he shows any more signs of     depression. 6. Homelessness.  We will get social worker to consult on the patient     and see if the patient may qualify for some shelter.     Lonia Blood, M.D.     Verlin Grills  D:  01/25/2011  T:  01/25/2011  Job:  [id number]  Electronically Signed by Lonia Blood M.D. on 02/10/2011 05:54:28 AM

## 2011-03-19 ENCOUNTER — Emergency Department (HOSPITAL_COMMUNITY): Payer: Self-pay

## 2011-03-19 ENCOUNTER — Other Ambulatory Visit: Payer: Self-pay

## 2011-03-19 ENCOUNTER — Emergency Department (HOSPITAL_COMMUNITY)
Admission: EM | Admit: 2011-03-19 | Discharge: 2011-03-19 | Disposition: A | Discharge status: Home / Self Care | Payer: Self-pay | Attending: Emergency Medicine | Admitting: Emergency Medicine

## 2011-03-19 ENCOUNTER — Encounter: Payer: Self-pay | Admitting: Emergency Medicine

## 2011-03-19 DIAGNOSIS — M545 Low back pain, unspecified: Secondary | ICD-10-CM | POA: Insufficient documentation

## 2011-03-19 DIAGNOSIS — M542 Cervicalgia: Secondary | ICD-10-CM | POA: Insufficient documentation

## 2011-03-19 DIAGNOSIS — M25559 Pain in unspecified hip: Secondary | ICD-10-CM | POA: Insufficient documentation

## 2011-03-19 DIAGNOSIS — R071 Chest pain on breathing: Secondary | ICD-10-CM | POA: Insufficient documentation

## 2011-03-19 DIAGNOSIS — R05 Cough: Secondary | ICD-10-CM | POA: Insufficient documentation

## 2011-03-19 DIAGNOSIS — M79609 Pain in unspecified limb: Secondary | ICD-10-CM | POA: Insufficient documentation

## 2011-03-19 DIAGNOSIS — M5416 Radiculopathy, lumbar region: Secondary | ICD-10-CM

## 2011-03-19 DIAGNOSIS — IMO0002 Reserved for concepts with insufficient information to code with codable children: Secondary | ICD-10-CM | POA: Insufficient documentation

## 2011-03-19 DIAGNOSIS — F10229 Alcohol dependence with intoxication, unspecified: Secondary | ICD-10-CM | POA: Insufficient documentation

## 2011-03-19 DIAGNOSIS — R209 Unspecified disturbances of skin sensation: Secondary | ICD-10-CM | POA: Insufficient documentation

## 2011-03-19 DIAGNOSIS — G8929 Other chronic pain: Secondary | ICD-10-CM | POA: Insufficient documentation

## 2011-03-19 DIAGNOSIS — R079 Chest pain, unspecified: Secondary | ICD-10-CM | POA: Insufficient documentation

## 2011-03-19 DIAGNOSIS — R059 Cough, unspecified: Secondary | ICD-10-CM | POA: Insufficient documentation

## 2011-03-19 DIAGNOSIS — M549 Dorsalgia, unspecified: Secondary | ICD-10-CM

## 2011-03-19 DIAGNOSIS — F1010 Alcohol abuse, uncomplicated: Secondary | ICD-10-CM

## 2011-03-19 DIAGNOSIS — F17200 Nicotine dependence, unspecified, uncomplicated: Secondary | ICD-10-CM | POA: Insufficient documentation

## 2011-03-19 DIAGNOSIS — F1020 Alcohol dependence, uncomplicated: Secondary | ICD-10-CM

## 2011-03-19 LAB — COMPREHENSIVE METABOLIC PANEL
AST: 36 U/L (ref 0–37)
Albumin: 4.2 g/dL (ref 3.5–5.2)
Chloride: 103 mEq/L (ref 96–112)
Creatinine, Ser: 0.74 mg/dL (ref 0.50–1.35)
Potassium: 3.9 mEq/L (ref 3.5–5.1)
Total Bilirubin: 0.3 mg/dL (ref 0.3–1.2)
Total Protein: 7.6 g/dL (ref 6.0–8.3)

## 2011-03-19 LAB — CBC
HCT: 47.4 % (ref 39.0–52.0)
Hemoglobin: 16.4 g/dL (ref 13.0–17.0)
MCH: 32.5 pg (ref 26.0–34.0)
MCHC: 34.6 g/dL (ref 30.0–36.0)

## 2011-03-19 LAB — DIFFERENTIAL
Basophils Relative: 1 % (ref 0–1)
Eosinophils Absolute: 0.1 10*3/uL (ref 0.0–0.7)
Monocytes Absolute: 0.7 10*3/uL (ref 0.1–1.0)
Monocytes Relative: 8 % (ref 3–12)

## 2011-03-19 LAB — RAPID URINE DRUG SCREEN, HOSP PERFORMED
Amphetamines: NOT DETECTED
Barbiturates: NOT DETECTED
Cocaine: NOT DETECTED
Tetrahydrocannabinol: NOT DETECTED

## 2011-03-19 LAB — POCT I-STAT TROPONIN I

## 2011-03-19 MED ORDER — OXYCODONE-ACETAMINOPHEN 5-325 MG PO TABS: 2.0000 | ORAL_TABLET | Freq: Three times a day (TID) | ORAL | Status: AC | PRN

## 2011-03-19 MED ORDER — OXYCODONE-ACETAMINOPHEN 5-325 MG PO TABS
2.0000 | ORAL_TABLET | Freq: Once | ORAL | Status: AC
  Administered 2011-03-19: 2 via ORAL
  Filled 2011-03-19: qty 2

## 2011-03-19 NOTE — ED Provider Notes (Signed)
History     CSN: [id number] Arrival date & time: 03/19/2011  2:11 AM   First MD Initiated Contact with Patient 03/19/11 0501      Chief Complaint  Patient presents with  . Pain    left side of the body pain x3 days. patient stated he has been seen here before for this kind of pain but this time is worse, he report numbness of his left leg x3 days.  . Cough    (Consider location/radiation/quality/duration/timing/severity/associated sxs/prior treatment) HPI This 55 year old male has a chronic cough for several months which has not changed, he has chronic chest pain to the left side 24 hours a day for several months as well which is unchanged, he also has chronic neck pain and chronic low back pain, the neck pain is stable but the low back pain radiating to his left hip and left leg are worse over the last several days but they've been present daily 24 hours a day constantly for months, he also has several months of left leg numbness which is persistent without weakness or change in bowel or bladder function. He has no exertional chest pain no shortness of breath no abdominal pain no bloody stools no fevers no rashes no recent trauma. He is not sure if he wants detox from alcohol or not. He is a chronic alcoholic and left the hospital AGAINST MEDICAL ADVICE last and he is admitted for the same symptoms a few months ago. He denies any threats or himself or others denies depression suicidal homicidal ideation or hallucinations. He denies withdrawal symptoms at this time. He states he has not drunk or in withdrawal at this time with no nausea vomiting or tremors or sweats. History reviewed. No pertinent past medical history.  History reviewed. No pertinent past surgical history.  History reviewed. No pertinent family history.  History  Substance Use Topics  . Smoking status: Current Everyday Smoker  . Smokeless tobacco: Not on file  . Alcohol Use: Yes      Review of Systems    Constitutional: Negative for fever.       10 Systems reviewed and are negative for acute change except as noted in the HPI.  HENT: Positive for neck pain. Negative for congestion.   Eyes: Negative for discharge and redness.  Respiratory: Positive for cough. Negative for shortness of breath.   Cardiovascular: Positive for chest pain.  Gastrointestinal: Negative for vomiting and abdominal pain.  Genitourinary: Negative for dysuria.  Musculoskeletal: Positive for back pain.  Skin: Negative for rash.  Neurological: Positive for numbness. Negative for syncope, weakness and headaches.  Psychiatric/Behavioral: Negative for suicidal ideas and hallucinations.       No behavior change.    Allergies  Review of patient's allergies indicates no known allergies.  Home Medications   Current Outpatient Rx  Name Route Sig Dispense Refill  . OXYCODONE-ACETAMINOPHEN 5-325 MG PO TABS Oral Take 2 tablets by mouth every 8 (eight) hours as needed for pain. 12 tablet 0    BP 122/76  Pulse 106  Temp(Src) 98.7 F (37.1 C) (Oral)  Resp 17  Ht 5\' 7"  (1.702 m)  Wt 210 lb (95.255 kg)  BMI 32.89 kg/m2  SpO2 94%  Physical Exam  Nursing note and vitals reviewed. Constitutional:       Awake, alert, nontoxic appearance with baseline speech for patient.  HENT:  Head: Atraumatic.  Mouth/Throat: Oropharynx is clear and moist. No oropharyngeal exudate.  Eyes: EOM are normal. Pupils are equal, round,  and reactive to light. Right eye exhibits no discharge. Left eye exhibits no discharge.  Neck: Neck supple.       Cervical spine and posterior neck are mildly diffusely tender  Cardiovascular: Normal rate and regular rhythm.   No murmur heard. Pulmonary/Chest: Effort normal and breath sounds normal. No stridor. No respiratory distress. He has no wheezes. He has no rales. He exhibits tenderness.       Left anterior lateral chest wall shows reproducible tenderness  Abdominal: Soft. Bowel sounds are normal. He  exhibits no mass. There is no tenderness. There is no rebound.  Musculoskeletal: He exhibits no edema and no tenderness.       Baseline ROM, moves extremities with no obvious new focal weakness.  Lymphadenopathy:    He has no cervical adenopathy.  Neurological: He is alert.       Awake, alert, cooperative and aware of situation; motor strength bilaterally; sensation normal to light touch bilaterally arms with normal light touch to right leg but decreased light touch to the left leg which he states is stable that way for a few months; peripheral visual fields full to confrontation; no facial asymmetry; tongue midline; major cranial nerves appear intact; no pronator drift, normal finger to nose bilaterally  Skin: No rash noted.  Psychiatric: He has a normal mood and affect.    ED Course  Procedures (including critical care time)  ECG: Sinus rhythm, ventricular rate 96, normal axis, normal intervals, nonspecific T wave changes lateral leads, no ST segment changes noted, no comparison ECG available.  The patient does not want detox consideration after all. He would like discharge. Labs Reviewed  ETHANOL - Abnormal; Notable for the following:    Alcohol, Ethyl (B) 190 (*)    All other components within normal limits  CBC  DIFFERENTIAL  URINE RAPID DRUG SCREEN (HOSP PERFORMED)  COMPREHENSIVE METABOLIC PANEL  POCT I-STAT TROPONIN I  I-STAT TROPONIN I   Dg Chest 2 View  03/19/2011  *RADIOLOGY REPORT*  Clinical Data: Chest pain  CHEST - 2 VIEW  Comparison: 01/24/2011  Findings: Lungs are essentially clear, noting mild lower lobe scarring. No pleural effusion or pneumothorax.  Cardiomediastinal silhouette is within normal limits.  Mild degenerative changes of the visualized thoracolumbar spine.  IMPRESSION: No evidence of acute cardiopulmonary disease.  Original Report Authenticated By: Charline Bills, M.D.   Dg Lumbar Spine Complete  03/19/2011  *RADIOLOGY REPORT*  Clinical Data: Low back  pain  LUMBAR SPINE - COMPLETE 4+ VIEW  Comparison: CT abdomen pelvis dated 08/28/2003  Findings: Five lumbar-type vertebral bodies.  No evidence of fracture or dislocation.  Vertebral body heights are maintained. Bilateral pars defects at L5 with grade 1 spondylolisthesis, unchanged.  Mild multilevel degenerative changes.  IMPRESSION: No fracture or dislocation is seen.  Original Report Authenticated By: Charline Bills, M.D.   Dg Hip Complete Left  03/19/2011  *RADIOLOGY REPORT*  Clinical Data: Left hip pain  LEFT HIP - COMPLETE 2+ VIEW  Comparison: None.  Findings: No fracture or dislocation is seen.  Visualized bony pelvis appears intact.  Bilateral hip joint spaces are symmetric.  Mild degenerative changes of the lower lumbar spine.  IMPRESSION: No acute osseous abnormality is seen.  Original Report Authenticated By: Charline Bills, M.D.     1. Back pain   2. Lumbar radiculopathy   3. Chronic pain   4. Neck pain   5. Chest pain   6. Alcohol abuse   7. Alcoholism  MDM  I doubt any other EMC precluding discharge at this time including, but not necessarily limited to the following:withdrawal, ACS.        Hurman Horn, MD 03/19/11 1500

## 2011-03-19 NOTE — ED Notes (Signed)
Patient back from x-ray 

## 2011-03-21 ENCOUNTER — Emergency Department (HOSPITAL_COMMUNITY)
Admission: EM | Admit: 2011-03-21 | Discharge: 2011-03-22 | Discharge status: Against Medical Advice | Payer: Self-pay | Attending: Emergency Medicine | Admitting: Emergency Medicine

## 2011-03-21 DIAGNOSIS — R4589 Other symptoms and signs involving emotional state: Secondary | ICD-10-CM | POA: Insufficient documentation

## 2011-03-22 ENCOUNTER — Emergency Department (HOSPITAL_COMMUNITY)
Admission: EM | Admit: 2011-03-22 | Discharge: 2011-03-22 | Disposition: A | Discharge status: Home / Self Care | Payer: Self-pay | Attending: Emergency Medicine | Admitting: Emergency Medicine

## 2011-03-22 ENCOUNTER — Emergency Department (HOSPITAL_COMMUNITY): Payer: Self-pay

## 2011-03-22 ENCOUNTER — Encounter (HOSPITAL_COMMUNITY): Payer: Self-pay | Admitting: Emergency Medicine

## 2011-03-22 DIAGNOSIS — R05 Cough: Secondary | ICD-10-CM | POA: Insufficient documentation

## 2011-03-22 DIAGNOSIS — F17200 Nicotine dependence, unspecified, uncomplicated: Secondary | ICD-10-CM | POA: Insufficient documentation

## 2011-03-22 DIAGNOSIS — R059 Cough, unspecified: Secondary | ICD-10-CM | POA: Insufficient documentation

## 2011-03-22 DIAGNOSIS — F1910 Other psychoactive substance abuse, uncomplicated: Secondary | ICD-10-CM | POA: Insufficient documentation

## 2011-03-22 HISTORY — DX: Alcohol abuse, uncomplicated: F10.10

## 2011-03-22 HISTORY — DX: Unspecified osteoarthritis, unspecified site: M19.90

## 2011-03-22 LAB — COMPREHENSIVE METABOLIC PANEL
Alkaline Phosphatase: 46 U/L (ref 39–117)
BUN: 11 mg/dL (ref 6–23)
CO2: 28 mEq/L (ref 19–32)
Chloride: 100 mEq/L (ref 96–112)
Creatinine, Ser: 0.87 mg/dL (ref 0.50–1.35)
GFR calc non Af Amer: >90 mL/min (ref >90)
Glucose, Bld: 98 mg/dL (ref 70–99)
Potassium: 4 mEq/L (ref 3.5–5.1)
Total Bilirubin: 0.1 mg/dL — ABNORMAL LOW (ref 0.3–1.2)

## 2011-03-22 LAB — CBC
HCT: 48.8 % (ref 39.0–52.0)
Hemoglobin: 16.7 g/dL (ref 13.0–17.0)
MCHC: 34.2 g/dL (ref 30.0–36.0)
RBC: 5.24 MIL/uL (ref 4.22–5.81)

## 2011-03-22 LAB — ETHANOL: Alcohol, Ethyl (B): 259 mg/dL — ABNORMAL HIGH (ref 0–11)

## 2011-03-22 LAB — DIFFERENTIAL
Basophils Relative: 0 % (ref 0–1)
Lymphocytes Relative: 35 % (ref 12–46)
Lymphs Abs: 3.2 10*3/uL (ref 0.7–4.0)
Monocytes Absolute: 0.8 10*3/uL (ref 0.1–1.0)
Monocytes Relative: 8 % (ref 3–12)
Neutro Abs: 5.1 10*3/uL (ref 1.7–7.7)
Neutrophils Relative %: 56 % (ref 43–77)

## 2011-03-22 LAB — LIPASE, BLOOD: Lipase: 41 U/L (ref 11–59)

## 2011-03-22 LAB — ACETAMINOPHEN LEVEL: Acetaminophen (Tylenol), Serum: <15 ug/mL (ref 10–30)

## 2011-03-22 LAB — RAPID URINE DRUG SCREEN, HOSP PERFORMED
Amphetamines: NOT DETECTED
Opiates: NOT DETECTED

## 2011-03-22 MED ORDER — ALBUTEROL SULFATE HFA 108 (90 BASE) MCG/ACT IN AERS
INHALATION_SPRAY | RESPIRATORY_TRACT | Status: AC
  Filled 2011-03-22: qty 6.7

## 2011-03-22 MED ORDER — THIAMINE HCL 100 MG/ML IJ SOLN: 100.0000 mg | Freq: Every day | INTRAMUSCULAR | Status: DC

## 2011-03-22 MED ORDER — FOLIC ACID 1 MG PO TABS
1.0000 mg | ORAL_TABLET | Freq: Every day | ORAL | Status: DC
  Administered 2011-03-22: 1 mg via ORAL
  Filled 2011-03-22: qty 1

## 2011-03-22 MED ORDER — IBUPROFEN 600 MG PO TABS: 600.0000 mg | ORAL_TABLET | Freq: Three times a day (TID) | ORAL | Status: DC | PRN

## 2011-03-22 MED ORDER — VITAMIN B-1 100 MG PO TABS
100.0000 mg | ORAL_TABLET | Freq: Every day | ORAL | Status: DC
  Administered 2011-03-22: 100 mg via ORAL
  Filled 2011-03-22: qty 1

## 2011-03-22 MED ORDER — NICOTINE 21 MG/24HR TD PT24
21.0000 mg | MEDICATED_PATCH | Freq: Every day | TRANSDERMAL | Status: DC
  Administered 2011-03-22: 21 mg via TRANSDERMAL
  Filled 2011-03-22: qty 1

## 2011-03-22 MED ORDER — THERA M PLUS PO TABS
1.0000 | ORAL_TABLET | Freq: Every day | ORAL | Status: DC
  Administered 2011-03-22: 1 via ORAL
  Filled 2011-03-22 (×2): qty 1

## 2011-03-22 MED ORDER — ZOLPIDEM TARTRATE 5 MG PO TABS: 5.0000 mg | ORAL_TABLET | Freq: Every evening | ORAL | Status: DC | PRN

## 2011-03-22 MED ORDER — ACETAMINOPHEN 325 MG PO TABS
650.0000 mg | ORAL_TABLET | ORAL | Status: DC | PRN
  Administered 2011-03-22: 650 mg via ORAL
  Filled 2011-03-22: qty 2

## 2011-03-22 MED ORDER — ALBUTEROL SULFATE HFA 108 (90 BASE) MCG/ACT IN AERS
2.0000 | INHALATION_SPRAY | Freq: Once | RESPIRATORY_TRACT | Status: AC
  Administered 2011-03-22: 2 via RESPIRATORY_TRACT

## 2011-03-22 MED ORDER — LORAZEPAM 2 MG/ML IJ SOLN: 1.0000 mg | Freq: Four times a day (QID) | INTRAMUSCULAR | Status: DC | PRN

## 2011-03-22 MED ORDER — ONDANSETRON HCL 4 MG PO TABS: 4.0000 mg | ORAL_TABLET | Freq: Three times a day (TID) | ORAL | Status: DC | PRN

## 2011-03-22 MED ORDER — LORAZEPAM 1 MG PO TABS: 1.0000 mg | ORAL_TABLET | Freq: Four times a day (QID) | ORAL | Status: DC | PRN

## 2011-03-22 NOTE — ED Provider Notes (Signed)
History     CSN: [id number] Arrival date & time: 03/22/2011  1:48 AM   First MD Initiated Contact with Patient 03/22/11 0202      Chief Complaint  Patient presents with  . Medical Clearance    (Consider location/radiation/quality/duration/timing/severity/associated sxs/prior treatment) HPI Patient is a 55 year old male who presents today requesting detox. Patient used multiple substances including alcohol as well as crack cocaine. His last use was immediately prior to entering the ED. He is homeless. He denies suicidal or homicidal ideation. He cannot recall the last time he was sober. He denies any other symptoms. Past Medical History  Diagnosis Date  . Heart failure   . Arthritis   . Alcohol abuse     History reviewed. No pertinent past surgical history.  History reviewed. No pertinent family history.  History  Substance Use Topics  . Smoking status: Current Everyday Smoker -- 2.0 packs/day    Types: Cigarettes  . Smokeless tobacco: Not on file  . Alcohol Use: Yes     Heavy      Review of Systems  Constitutional: Negative.   HENT: Negative.   Eyes: Negative.   Respiratory: Negative.   Cardiovascular: Negative.   Gastrointestinal: Negative.   Genitourinary: Negative.   Musculoskeletal: Negative.   Skin: Negative.   Neurological: Negative.   Hematological: Negative.   Psychiatric/Behavioral: Negative.   All other systems reviewed and are negative.    Allergies  Review of patient's allergies indicates no known allergies.  Home Medications   Current Outpatient Rx  Name Route Sig Dispense Refill  . OXYCODONE-ACETAMINOPHEN 5-325 MG PO TABS Oral Take 2 tablets by mouth every 8 (eight) hours as needed for pain. 12 tablet 0    BP 128/85  Pulse 105  Temp(Src) 98.7 F (37.1 C) (Oral)  Resp 20  SpO2 94%  Physical Exam  Nursing note and vitals reviewed. Constitutional: He is oriented to person, place, and time. He appears well-developed and  well-nourished. No distress.  HENT:  Head: Normocephalic and atraumatic.  Eyes: Conjunctivae and EOM are normal. Pupils are equal, round, and reactive to light.  Neck: Normal range of motion.  Cardiovascular: Normal rate, regular rhythm, normal heart sounds and intact distal pulses.  Exam reveals no gallop and no friction rub.   No murmur heard. Pulmonary/Chest: Effort normal and breath sounds normal. No respiratory distress. He has no wheezes. He has no rales.  Abdominal: Soft. Bowel sounds are normal. He exhibits no distension. There is no tenderness. There is no rebound and no guarding.  Musculoskeletal: Normal range of motion. He exhibits no edema.  Neurological: He is alert and oriented to person, place, and time. No cranial nerve deficit. He exhibits normal muscle tone. Coordination normal.  Skin: Skin is warm and dry. No erythema.  Psychiatric: He has a normal mood and affect.       Patient presents with desire for detox. Denies hallucinations, suicidal ideation, and homicidal ideation.    ED Course  Procedures (including critical care time)  Labs Reviewed  COMPREHENSIVE METABOLIC PANEL - Abnormal; Notable for the following:    AST 40 (*)    Total Bilirubin 0.1 (*)    All other components within normal limits  ETHANOL - Abnormal; Notable for the following:    Alcohol, Ethyl (B) 259 (*)    All other components within normal limits  URINE RAPID DRUG SCREEN (HOSP PERFORMED) - Abnormal; Notable for the following:    Cocaine POSITIVE (*)    All other components  within normal limits  CBC  DIFFERENTIAL  ACETAMINOPHEN LEVEL  LIPASE, BLOOD   Dg Chest 2 View  03/22/2011  *RADIOLOGY REPORT*  Clinical Data: Cough  CHEST - 2 VIEW  Comparison: 03/19/2011  Findings: Linear atelectasis or fibrosis in the lung bases.  Normal heart size and pulmonary vascularity.  No focal airspace consolidation.  No pneumothorax.  Calcification of the aorta. Appearance is similar to previous study.   IMPRESSION: Linear fibrosis or atelectasis in the lung bases.  No evidence of active consolidation.  Original Report Authenticated By: Marlon Pel, M.D.     1. Polysubstance abuse       MDM  Patient was evaluated by myself. He was here desiring detox from polysubstance abuse. Patient did admit to cocaine use as well as marijuana use in his tox screen indicated this. His alcohol level was 259. Patient was still alert and oriented. He was given 1 L of normal saline IV bolus. Patient also had holding orders placed. These included alcohol withdrawal protocol. Patient was transferred to the psych daily. He is awaiting evaluation at this time.        Cyndra Numbers, MD 03/22/11 [phone]

## 2011-03-22 NOTE — ED Notes (Signed)
Pt states he came in for detox from alcohol and drugs, cocaine, marjuana  Last drank was tonight  Pt states he drinks about 3 to 4 40 oz beers per day

## 2011-03-22 NOTE — ED Notes (Signed)
Pt accepted at [facility], discharged instructions reviewed and pt verbalizes understanding. Departs unit in safe condition to lobby where transporter for [facility] waits to transport.

## 2011-03-22 NOTE — Progress Notes (Signed)
Patient was accepted by [facility], however, he does not have an identification card he can use to get on train. Patient reports not having anyone who can drive him there.  This Clinical research associate contacted [facility] who has beds, however, patient refuses to go there from previous experiences.  Eventually patient did agree to go to [facility] and information was sent for review.   Ileene Hutchinson , MSW, LCSWA 03/28/2011 8:05 AM

## 2011-03-22 NOTE — ED Provider Notes (Signed)
Pt accepted to [facility], they will come pick him up. Accepting physician, Rhetta Mura.  Lottie Mussel, [place] 03/22/11 2032

## 2011-03-22 NOTE — ED Notes (Signed)
Two patient belonging bags locked in activity room. 

## 2011-03-22 NOTE — ED Notes (Signed)
Patient is resting comfortably. 

## 2011-03-22 NOTE — ED Notes (Signed)
Pt states that he wants to get help getting off of drugs and alcohol.  Denies SI/HI.  Very poor hygiene.  Pt was given clean scrubs and supplies for shower.

## 2011-03-22 NOTE — ED Notes (Signed)
Pt has been accepted at [facility] for detox treatment by Rhetta Mura. Pt is agreeable to this disposition. EDP notified and is in agreement with disposition. RN made aware. Pt is currently awaiting transport from [facility]. No further CSW needs identified at this time.
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
## 2011-03-22 NOTE — ED Notes (Signed)
Patient seen and evaluated by me. C/O mild headache. Has frequent headaches and this is similar to his typical.  Takes tylenol at home. Tylenol requested. Nursing staff aware.  BP 128/85  Pulse 105  Temp(Src) 98.7 F (37.1 C) (Oral)  Resp 20  SpO2 94%   Forbes Cellar, MD 03/22/11 1025

## 2011-03-22 NOTE — ED Notes (Signed)
[id number]> Expected date:<BR> Expected time:<BR> Means of arrival:<BR> Comments:<BR> Only 7 pt&#39;s

## 2011-03-22 NOTE — Discharge Planning (Addendum)
This Clinical research associate contacted [facility] who advised they do have male beds. This writer is pending repeat BAL prior to sending referral to [facility]. Requested from patient's nurse.   Ileene Hutchinson , MSW, LCSWA 03/22/2011 11:00 AM  BAL results received. Information has been faxed to [facility].   Ileene Hutchinson , MSW, LCSWA 03/22/2011 11:43 AM

## 2011-03-22 NOTE — ED Notes (Signed)
Attempted to call pt to triage room x3, no response.

## 2011-03-24 NOTE — ED Provider Notes (Signed)
Medical screening examination/treatment/procedure(s) were performed by non-physician practitioner and as supervising physician I was immediately available for consultation/collaboration.   Camielle Sizer M Teigan Sahli, DO 03/24/11 1140 

## 2011-04-12 ENCOUNTER — Other Ambulatory Visit: Payer: Self-pay

## 2011-04-12 ENCOUNTER — Emergency Department (HOSPITAL_COMMUNITY)
Admission: EM | Admit: 2011-04-12 | Discharge: 2011-04-12 | Discharge status: Against Medical Advice | Payer: Self-pay | Attending: Emergency Medicine | Admitting: Emergency Medicine

## 2011-04-12 ENCOUNTER — Encounter (HOSPITAL_COMMUNITY): Payer: Self-pay | Admitting: Emergency Medicine

## 2011-04-12 DIAGNOSIS — F1010 Alcohol abuse, uncomplicated: Secondary | ICD-10-CM | POA: Insufficient documentation

## 2011-04-12 DIAGNOSIS — F1410 Cocaine abuse, uncomplicated: Secondary | ICD-10-CM | POA: Insufficient documentation
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
## 2011-04-12 NOTE — ED Notes (Signed)
Verbally inappropriate with family members in waiting area with much profanity .Brian [name] c/o chest pain and was take to EKG room and EKG was completed. NSR noted he was then returned to waiting area. He again began using profanity and being disruptive. I asked him to reframe from behavior and pt continued to remain loud and displaying no self control. Pt smelled heavily og ETOH and admitted to drinking this evening. I informed him that we would be more that happy to treatment him or detox but he need to control his behavior and stop disrupting the waiting area because he was scarring the other patients. His reply was "f--k the other patients. Pt was escorted of premises by security and [employer] officer

## 2011-04-17 ENCOUNTER — Encounter (HOSPITAL_COMMUNITY): Payer: Self-pay | Admitting: Emergency Medicine

## 2011-04-17 ENCOUNTER — Emergency Department (HOSPITAL_COMMUNITY): Payer: Self-pay

## 2011-04-17 ENCOUNTER — Emergency Department (HOSPITAL_COMMUNITY)
Admission: EM | Admit: 2011-04-17 | Discharge: 2011-04-17 | Disposition: A | Discharge status: Home / Self Care | Payer: Self-pay | Attending: Emergency Medicine | Admitting: Emergency Medicine

## 2011-04-17 DIAGNOSIS — R51 Headache: Secondary | ICD-10-CM | POA: Insufficient documentation

## 2011-04-17 DIAGNOSIS — T07XXXA Unspecified multiple injuries, initial encounter: Secondary | ICD-10-CM | POA: Insufficient documentation

## 2011-04-17 DIAGNOSIS — R040 Epistaxis: Secondary | ICD-10-CM | POA: Insufficient documentation

## 2011-04-17 LAB — URINALYSIS, ROUTINE W REFLEX MICROSCOPIC
Hgb urine dipstick: NEGATIVE
Nitrite: NEGATIVE
Protein, ur: NEGATIVE mg/dL
Specific Gravity, Urine: 1.013 (ref 1.005–1.030)
Urobilinogen, UA: 0.2 mg/dL (ref 0.0–1.0)

## 2011-04-17 MED ORDER — HYDROCODONE-ACETAMINOPHEN 5-325 MG PO TABS
1.0000 | ORAL_TABLET | Freq: Once | ORAL | Status: AC
  Administered 2011-04-17: 1 via ORAL
  Filled 2011-04-17: qty 1

## 2011-04-17 MED ORDER — HYDROCODONE-ACETAMINOPHEN 5-325 MG PO TABS: 1.0000 | ORAL_TABLET | ORAL | Status: DC | PRN
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
## 2011-04-17 NOTE — ED Provider Notes (Signed)
Medical screening examination/treatment/procedure(s) were performed by non-physician practitioner and as supervising physician I was immediately available for consultation/collaboration.  Jasmine Awe, MD 04/17/11 [phone]
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
## 2011-04-19 ENCOUNTER — Emergency Department (HOSPITAL_COMMUNITY): Payer: Self-pay

## 2011-04-19 ENCOUNTER — Other Ambulatory Visit: Payer: Self-pay

## 2011-04-19 ENCOUNTER — Encounter (HOSPITAL_COMMUNITY): Payer: Self-pay | Admitting: *Deleted

## 2011-04-19 ENCOUNTER — Observation Stay (HOSPITAL_COMMUNITY)
Admission: EM | Admit: 2011-04-19 | Discharge: 2011-04-22 | Discharge status: Against Medical Advice | Payer: Self-pay | Attending: Internal Medicine | Admitting: Internal Medicine

## 2011-04-19 DIAGNOSIS — I472 Ventricular tachycardia, unspecified: Secondary | ICD-10-CM | POA: Diagnosis present

## 2011-04-19 DIAGNOSIS — F17200 Nicotine dependence, unspecified, uncomplicated: Secondary | ICD-10-CM | POA: Insufficient documentation

## 2011-04-19 DIAGNOSIS — F1410 Cocaine abuse, uncomplicated: Secondary | ICD-10-CM | POA: Diagnosis present

## 2011-04-19 DIAGNOSIS — J441 Chronic obstructive pulmonary disease with (acute) exacerbation: Secondary | ICD-10-CM | POA: Diagnosis present

## 2011-04-19 DIAGNOSIS — F1010 Alcohol abuse, uncomplicated: Secondary | ICD-10-CM | POA: Diagnosis present

## 2011-04-19 DIAGNOSIS — Z590 Homelessness: Secondary | ICD-10-CM

## 2011-04-19 DIAGNOSIS — J4489 Other specified chronic obstructive pulmonary disease: Secondary | ICD-10-CM | POA: Insufficient documentation

## 2011-04-19 DIAGNOSIS — R0789 Other chest pain: Secondary | ICD-10-CM | POA: Diagnosis present

## 2011-04-19 DIAGNOSIS — I4729 Other ventricular tachycardia: Secondary | ICD-10-CM | POA: Insufficient documentation

## 2011-04-19 DIAGNOSIS — J449 Chronic obstructive pulmonary disease, unspecified: Secondary | ICD-10-CM

## 2011-04-19 DIAGNOSIS — R079 Chest pain, unspecified: Principal | ICD-10-CM | POA: Insufficient documentation

## 2011-04-19 DIAGNOSIS — J40 Bronchitis, not specified as acute or chronic: Secondary | ICD-10-CM

## 2011-04-19 HISTORY — DX: Homelessness unspecified: Z59.00

## 2011-04-19 HISTORY — DX: Suicide attempt, initial encounter: T14.91XA

## 2011-04-19 HISTORY — DX: Tobacco use: Z72.0

## 2011-04-19 HISTORY — DX: Homelessness: Z59.0

## 2011-04-19 HISTORY — DX: Cocaine abuse, uncomplicated: F14.10

## 2011-04-19 HISTORY — DX: Heart failure, unspecified: I50.9

## 2011-04-19 HISTORY — DX: Chronic obstructive pulmonary disease, unspecified: J44.9

## 2011-04-19 LAB — CBC
MCV: 95.2 fL (ref 78.0–100.0)
Platelets: 235 10*3/uL (ref 150–400)
RDW: 14.1 % (ref 11.5–15.5)
WBC: 7.9 10*3/uL (ref 4.0–10.5)

## 2011-04-19 LAB — DIFFERENTIAL
Basophils Absolute: 0 10*3/uL (ref 0.0–0.1)
Eosinophils Absolute: 0.1 10*3/uL (ref 0.0–0.7)
Eosinophils Relative: 1 % (ref 0–5)
Lymphocytes Relative: 35 % (ref 12–46)
Neutrophils Relative %: 54 % (ref 43–77)

## 2011-04-19 LAB — POCT I-STAT TROPONIN I: Troponin i, poc: 0 ng/mL (ref 0.00–0.08)

## 2011-04-19 LAB — POCT I-STAT, CHEM 8
BUN: 7 mg/dL (ref 6–23)
Hemoglobin: 18 g/dL — ABNORMAL HIGH (ref 13.0–17.0)
Potassium: 4.7 mEq/L (ref 3.5–5.1)
Sodium: 142 mEq/L (ref 135–145)
TCO2: 28 mmol/L (ref 0–100)

## 2011-04-19 NOTE — ED Notes (Signed)
C/o CP,sob,cough, congestion, chills (hot & cold), describes cough as productive yellow. Onset 2d ago, worse tonight. Also mentions L arm & fingers numb and tingling.

## 2011-04-20 ENCOUNTER — Encounter (HOSPITAL_COMMUNITY): Payer: Self-pay | Admitting: Internal Medicine

## 2011-04-20 DIAGNOSIS — Z590 Homelessness: Secondary | ICD-10-CM | POA: Insufficient documentation

## 2011-04-20 DIAGNOSIS — J441 Chronic obstructive pulmonary disease with (acute) exacerbation: Secondary | ICD-10-CM | POA: Diagnosis present

## 2011-04-20 DIAGNOSIS — F1410 Cocaine abuse, uncomplicated: Secondary | ICD-10-CM | POA: Diagnosis present

## 2011-04-20 DIAGNOSIS — I472 Ventricular tachycardia: Secondary | ICD-10-CM | POA: Diagnosis present

## 2011-04-20 DIAGNOSIS — R0789 Other chest pain: Secondary | ICD-10-CM | POA: Diagnosis present

## 2011-04-20 DIAGNOSIS — F1010 Alcohol abuse, uncomplicated: Secondary | ICD-10-CM | POA: Diagnosis present

## 2011-04-20 LAB — CARDIAC PANEL(CRET KIN+CKTOT+MB+TROPI)
Relative Index: 2.6 — ABNORMAL HIGH (ref 0.0–2.5)
Relative Index: 2.5 (ref 0.0–2.5)
Total CK: 142 U/L (ref 7–232)
Troponin I: <0.3 ng/mL (ref <0.30)

## 2011-04-20 LAB — RAPID URINE DRUG SCREEN, HOSP PERFORMED
Barbiturates: NOT DETECTED
Benzodiazepines: NOT DETECTED
Cocaine: POSITIVE — AB
Tetrahydrocannabinol: NOT DETECTED

## 2011-04-20 LAB — LIPID PANEL
HDL: 64 mg/dL (ref >39)
LDL Cholesterol: 50 mg/dL (ref 0–99)
Total CHOL/HDL Ratio: 1.9 RATIO
VLDL: 10 mg/dL (ref 0–40)

## 2011-04-20 LAB — PRO B NATRIURETIC PEPTIDE: Pro B Natriuretic peptide (BNP): 32.4 pg/mL (ref 0–125)

## 2011-04-20 LAB — ETHANOL: Alcohol, Ethyl (B): <11 mg/dL (ref 0–11)

## 2011-04-20 LAB — PROTIME-INR
INR: 0.95 (ref 0.00–1.49)
Prothrombin Time: 12.9 seconds (ref 11.6–15.2)

## 2011-04-20 LAB — TSH: TSH: 0.81 u[IU]/mL (ref 0.350–4.500)

## 2011-04-20 MED ORDER — ONDANSETRON HCL 4 MG PO TABS: 4.0000 mg | ORAL_TABLET | Freq: Four times a day (QID) | ORAL | Status: DC | PRN

## 2011-04-20 MED ORDER — SODIUM CHLORIDE 0.9 % IV SOLN: 250.0000 mL | INTRAVENOUS | Status: DC | PRN

## 2011-04-20 MED ORDER — SODIUM CHLORIDE 0.9 % IV SOLN
1.0000 mL/kg/h | INTRAVENOUS | Status: DC
  Administered 2011-04-21: 1 mL/kg/h via INTRAVENOUS

## 2011-04-20 MED ORDER — AZITHROMYCIN 250 MG PO TABS
250.0000 mg | ORAL_TABLET | Freq: Every day | ORAL | Status: DC
  Administered 2011-04-21 – 2011-04-22 (×2): 250 mg via ORAL
  Filled 2011-04-20 (×2): qty 1

## 2011-04-20 MED ORDER — DIAZEPAM 5 MG PO TABS: 5.0000 mg | ORAL_TABLET | ORAL | Status: AC

## 2011-04-20 MED ORDER — ASPIRIN 81 MG PO CHEW
81.0000 mg | CHEWABLE_TABLET | Freq: Every day | ORAL | Status: DC
  Administered 2011-04-20: 81 mg via ORAL
  Filled 2011-04-20: qty 1

## 2011-04-20 MED ORDER — SODIUM CHLORIDE 0.9 % IJ SOLN: 3.0000 mL | INTRAMUSCULAR | Status: DC | PRN

## 2011-04-20 MED ORDER — DOCUSATE SODIUM 100 MG PO CAPS
100.0000 mg | ORAL_CAPSULE | Freq: Two times a day (BID) | ORAL | Status: DC
  Administered 2011-04-20 – 2011-04-22 (×5): 100 mg via ORAL
  Filled 2011-04-20 (×7): qty 1

## 2011-04-20 MED ORDER — SENNA 8.6 MG PO TABS
1.0000 | ORAL_TABLET | Freq: Two times a day (BID) | ORAL | Status: DC
  Administered 2011-04-20 – 2011-04-22 (×5): 8.6 mg via ORAL
  Filled 2011-04-20 (×7): qty 1

## 2011-04-20 MED ORDER — LORAZEPAM 2 MG/ML IJ SOLN: 1.0000 mg | Freq: Four times a day (QID) | INTRAMUSCULAR | Status: DC | PRN

## 2011-04-20 MED ORDER — ASPIRIN 81 MG PO CHEW
324.0000 mg | CHEWABLE_TABLET | Freq: Once | ORAL | Status: AC
  Administered 2011-04-20: 324 mg via ORAL

## 2011-04-20 MED ORDER — ROSUVASTATIN CALCIUM 40 MG PO TABS
40.0000 mg | ORAL_TABLET | Freq: Every day | ORAL | Status: DC
  Administered 2011-04-22: 40 mg via ORAL
  Filled 2011-04-20 (×2): qty 1

## 2011-04-20 MED ORDER — ACETAMINOPHEN 325 MG PO TABS
650.0000 mg | ORAL_TABLET | Freq: Four times a day (QID) | ORAL | Status: DC | PRN
  Administered 2011-04-21: 650 mg via ORAL
  Filled 2011-04-20: qty 2

## 2011-04-20 MED ORDER — IPRATROPIUM BROMIDE 0.02 % IN SOLN
0.5000 mg | Freq: Four times a day (QID) | RESPIRATORY_TRACT | Status: DC
  Administered 2011-04-20 – 2011-04-21 (×3): 0.5 mg via RESPIRATORY_TRACT
  Filled 2011-04-20 (×4): qty 2.5

## 2011-04-20 MED ORDER — FOLIC ACID 1 MG PO TABS
1.0000 mg | ORAL_TABLET | Freq: Every day | ORAL | Status: DC
  Administered 2011-04-20 – 2011-04-22 (×3): 1 mg via ORAL
  Filled 2011-04-20 (×3): qty 1

## 2011-04-20 MED ORDER — ROSUVASTATIN CALCIUM 40 MG PO TABS
40.0000 mg | ORAL_TABLET | ORAL | Status: AC
  Administered 2011-04-20: 40 mg via ORAL
  Filled 2011-04-20: qty 1

## 2011-04-20 MED ORDER — ACETAMINOPHEN 650 MG RE SUPP: 650.0000 mg | Freq: Four times a day (QID) | RECTAL | Status: DC | PRN

## 2011-04-20 MED ORDER — ALBUTEROL SULFATE (5 MG/ML) 0.5% IN NEBU
2.5000 mg | INHALATION_SOLUTION | Freq: Four times a day (QID) | RESPIRATORY_TRACT | Status: DC
  Administered 2011-04-20 – 2011-04-21 (×4): 2.5 mg via RESPIRATORY_TRACT
  Filled 2011-04-20 (×5): qty 0.5

## 2011-04-20 MED ORDER — SODIUM CHLORIDE 0.9 % IJ SOLN
3.0000 mL | Freq: Two times a day (BID) | INTRAMUSCULAR | Status: DC
  Administered 2011-04-20 – 2011-04-22 (×4): 3 mL via INTRAVENOUS

## 2011-04-20 MED ORDER — ASPIRIN 81 MG PO CHEW
CHEWABLE_TABLET | ORAL | Status: AC
  Administered 2011-04-20: 324 mg via ORAL
  Filled 2011-04-20: qty 4

## 2011-04-20 MED ORDER — MORPHINE SULFATE 2 MG/ML IJ SOLN: 2.0000 mg | Freq: Three times a day (TID) | INTRAMUSCULAR | Status: DC | PRN

## 2011-04-20 MED ORDER — VITAMIN B-1 100 MG PO TABS
100.0000 mg | ORAL_TABLET | Freq: Every day | ORAL | Status: DC
  Administered 2011-04-20 – 2011-04-22 (×3): 100 mg via ORAL
  Filled 2011-04-20 (×3): qty 1

## 2011-04-20 MED ORDER — LORAZEPAM 1 MG PO TABS
0.0000 mg | ORAL_TABLET | Freq: Two times a day (BID) | ORAL | Status: DC
  Filled 2011-04-20 (×2): qty 1

## 2011-04-20 MED ORDER — ASPIRIN 81 MG PO CHEW
324.0000 mg | CHEWABLE_TABLET | ORAL | Status: AC
  Administered 2011-04-21: 324 mg via ORAL
  Filled 2011-04-20: qty 4

## 2011-04-20 MED ORDER — NICOTINE 7 MG/24HR TD PT24
7.0000 mg | MEDICATED_PATCH | Freq: Every day | TRANSDERMAL | Status: DC
  Administered 2011-04-21 – 2011-04-22 (×2): 7 mg via TRANSDERMAL
  Filled 2011-04-20 (×3): qty 1

## 2011-04-20 MED ORDER — LORAZEPAM 1 MG PO TABS
0.0000 mg | ORAL_TABLET | Freq: Four times a day (QID) | ORAL | Status: AC
  Administered 2011-04-20 – 2011-04-21 (×2): 2 mg via ORAL
  Administered 2011-04-21 (×2): 1 mg via ORAL
  Filled 2011-04-20 (×2): qty 1
  Filled 2011-04-20: qty 2

## 2011-04-20 MED ORDER — AZITHROMYCIN 250 MG PO TABS
500.0000 mg | ORAL_TABLET | Freq: Every day | ORAL | Status: AC
  Administered 2011-04-20: 500 mg via ORAL
  Filled 2011-04-20: qty 2

## 2011-04-20 MED ORDER — ONDANSETRON HCL 4 MG/2ML IJ SOLN: 4.0000 mg | Freq: Four times a day (QID) | INTRAMUSCULAR | Status: DC | PRN

## 2011-04-20 MED ORDER — ADULT MULTIVITAMIN W/MINERALS CH
1.0000 | ORAL_TABLET | Freq: Every day | ORAL | Status: DC
  Administered 2011-04-20 – 2011-04-22 (×3): 1 via ORAL
  Filled 2011-04-20 (×3): qty 1

## 2011-04-20 MED ORDER — ASPIRIN 81 MG PO CHEW
81.0000 mg | CHEWABLE_TABLET | Freq: Every day | ORAL | Status: DC
  Administered 2011-04-22: 81 mg via ORAL
  Filled 2011-04-20: qty 1

## 2011-04-20 MED ORDER — ALBUTEROL SULFATE (5 MG/ML) 0.5% IN NEBU: 2.5000 mg | INHALATION_SOLUTION | RESPIRATORY_TRACT | Status: DC | PRN

## 2011-04-20 MED ORDER — GUAIFENESIN ER 600 MG PO TB12
600.0000 mg | ORAL_TABLET | Freq: Two times a day (BID) | ORAL | Status: DC
  Administered 2011-04-20 – 2011-04-22 (×5): 600 mg via ORAL
  Filled 2011-04-20 (×7): qty 1

## 2011-04-20 MED ORDER — LORAZEPAM 1 MG PO TABS: 1.0000 mg | ORAL_TABLET | Freq: Four times a day (QID) | ORAL | Status: DC | PRN

## 2011-04-20 NOTE — ED Notes (Signed)
MD at bedside. Consulting Cardiologist at bedside.

## 2011-04-20 NOTE — ED Notes (Addendum)
While pt was resting in bed. Pt had 3 runs of  V Tach, longest one lasting 23 beats. Pt was alert and oriented x4. Was not in any respiratory distress at this time. RN in room with pt. Vitals taken. See vitals at 0151. Dr. Rachel Bo made aware. Pt placed on Zoll.. Will continue to monitor.

## 2011-04-20 NOTE — Progress Notes (Signed)
Subjective:  55yoM with h/o alcoholism, crack/cocaine abuse, homelessness, prior admission for chest  pain during substance abuse, admit with cough, congestion, chest pain, found to have  runs of WCT concerning for VTach.      Says he is having chest pain  Physical Exam: Blood pressure 133/88, pulse 93, temperature 98.1 F (36.7 C), temperature source Oral, resp. rate 22, height 5\' 6"  (1.676 m), weight 95.255 kg (210 lb), SpO2 93.00%. Arousable Chest with tenderness over the left side on palpation, rhonchi on auscultation Cor: RRR Abdomen: soft, NT    Investigations:  No results found for this or any previous visit (from the past 240 hour(s)).   Basic Metabolic Panel:  Basename 04/20/11 0434 04/19/11 2351  NA -- 142  K -- 4.7  CL -- 102  CO2 -- --  GLUCOSE -- 86  BUN -- 7  CREATININE -- 1.10  CALCIUM -- --  MG 2.0 --  PHOS -- --   Liver Function Tests: No results found for this basename: AST:2,ALT:2,ALKPHOS:2,BILITOT:2,PROT:2,ALBUMIN:2 in the last 72 hours   CBC:  Basename 04/19/11 2351 04/19/11 2333  WBC -- 7.9  NEUTROABS -- 4.2  HGB 18.0* 17.2*  HCT 53.0* 49.1  MCV -- 95.2  PLT -- 235    Dg Chest 2 View  04/20/2011  *RADIOLOGY REPORT*  Clinical Data: Chest pain.  Short of breath and chills.  CHEST - 2 VIEW  Comparison: 03/22/2011.  Findings: Cardiopericardial silhouette appears unchanged. Mediastinal contours normal.  Bilateral basilar atelectasis is present which is more pronounced than on the prior exam.  No focal airspace disease or consolidation.  Lung volumes are slightly lower than on the prior examination.  Mild tortuosity of the thoracic aorta.  IMPRESSION: No acute cardiopulmonary disease.  Increasing basilar atelectasis. No airspace disease/pneumonia.  Original Report Authenticated By: Andreas Newport, M.D.      Medications:  Scheduled:   . albuterol  2.5 mg Nebulization Q6H  . aspirin  324 mg Oral Once  . aspirin  81 mg Oral Daily  .  azithromycin  500 mg Oral Daily   Followed by  . azithromycin  250 mg Oral Daily  . docusate sodium  100 mg Oral BID  . folic acid  1 mg Oral Daily  . guaiFENesin  600 mg Oral BID  . ipratropium  0.5 mg Nebulization Q6H  . LORazepam  0-4 mg Oral Q6H   Followed by  . LORazepam  0-4 mg Oral Q12H  . mulitivitamin with minerals  1 tablet Oral Daily  . nicotine  7 mg Transdermal Daily  . senna  1 tablet Oral BID  . thiamine  100 mg Oral Daily    Impression:  Principal Problem:  *Chest pain Active Problems:  Alcohol abuse  COPD (chronic obstructive pulmonary disease)  Cocaine abuse  Ventricular tachycardia     Plan: Continue to monitor on telemetry Continue to treat the bronchitis    LOS: 1 day   Nameer Summer, MD Pager: [phone] 04/20/2011, 5:21 PM

## 2011-04-20 NOTE — ED Notes (Signed)
Pt complains of thirst, RN made aware of same. Moved to 21 for holding.

## 2011-04-20 NOTE — ED Provider Notes (Signed)
History     CSN: [id number]  Arrival date & time 04/19/11  2215   First MD Initiated Contact with Patient 04/20/11 0140      Chief Complaint  Patient presents with  . Chest Pain  . Cough    (Consider location/radiation/quality/duration/timing/severity/associated sxs/prior treatment) HPI This is a 56 year old white male with a one-week history of cough and shortness of breath. He is here specifically because he developed chest pain yesterday morning which worsened through the day. It is a sharp pain located to the left of the sternum. It is worse with palpation, deep breaths or coughing. His cough has been productive of yellow sputum. His chest pain is moderate to severe at its worst. He denies nausea or vomiting but has been having diarrhea. He's been having chills but is not aware of having fever.  Past Medical History  Diagnosis Date  . Heart failure   . Arthritis   . Alcohol abuse     History reviewed. No pertinent past surgical history.  History reviewed. No pertinent family history.  History  Substance Use Topics  . Smoking status: Current Everyday Smoker -- 2.0 packs/day    Types: Cigarettes  . Smokeless tobacco: Not on file  . Alcohol Use: Yes     Heavy      Review of Systems  All other systems reviewed and are negative.    Allergies  Review of patient's allergies indicates no known allergies.  Home Medications   Current Outpatient Rx  Name Route Sig Dispense Refill  . HYDROCODONE-ACETAMINOPHEN 5-325 MG PO TABS Oral Take 1 tablet by mouth every 4 (four) hours as needed. For pain      BP 108/74  Pulse 90  Temp(Src) 98.2 F (36.8 C) (Oral)  Resp 20  SpO2 93%  Physical Exam General: Well-developed, well-nourished male in no acute distress; appearance consistent with age of record HENT: normocephalic, atraumatic Eyes: Normal appearance Neck: supple Heart: regular rate and rhythm; distant cell Lungs: Decreased air movement bilaterally without  frank wheezing Chest: Moderate left parasternal tenderness without deformity or crepitus Abdomen: soft; nondistended Extremities: No deformity; full range of motion Neurologic: Awake, alert and oriented; motor function intact in all extremities and symmetric; no facial droop Skin: Warm and dry Psychiatric: Normal mood and affect    ED Course  Procedures (including critical care time)     MDM   Nursing notes and vitals signs, including pulse oximetry, reviewed.  Summary of this visit's results, reviewed by myself:  Labs:  Results for orders placed during the hospital encounter of 04/19/11  CBC      Component Value Range   WBC 7.9  4.0 - 10.5 (K/uL)   RBC 5.16  4.22 - 5.81 (MIL/uL)   Hemoglobin 17.2 (*) 13.0 - 17.0 (g/dL)   HCT 40.9  81.1 - 91.4 (%)   MCV 95.2  78.0 - 100.0 (fL)   MCH 33.3  26.0 - 34.0 (pg)   MCHC 35.0  30.0 - 36.0 (g/dL)   RDW 78.2  95.6 - 21.3 (%)   Platelets 235  150 - 400 (K/uL)  DIFFERENTIAL      Component Value Range   Neutrophils Relative 54  43 - 77 (%)   Neutro Abs 4.2  1.7 - 7.7 (K/uL)   Lymphocytes Relative 35  12 - 46 (%)   Lymphs Abs 2.8  0.7 - 4.0 (K/uL)   Monocytes Relative 10  3 - 12 (%)   Monocytes Absolute 0.8  0.1 - 1.0 (  K/uL)   Eosinophils Relative 1  0 - 5 (%)   Eosinophils Absolute 0.1  0.0 - 0.7 (K/uL)   Basophils Relative 0  0 - 1 (%)   Basophils Absolute 0.0  0.0 - 0.1 (K/uL)  POCT I-STAT, CHEM 8      Component Value Range   Sodium 142  135 - 145 (mEq/L)   Potassium 4.7  3.5 - 5.1 (mEq/L)   Chloride 102  96 - 112 (mEq/L)   BUN 7  6 - 23 (mg/dL)   Creatinine, Ser 1.61  0.50 - 1.35 (mg/dL)   Glucose, Bld 86  70 - 99 (mg/dL)   Calcium, Ion 0.96  0.45 - 1.32 (mmol/L)   TCO2 28  0 - 100 (mmol/L)   Hemoglobin 18.0 (*) 13.0 - 17.0 (g/dL)   HCT 40.9 (*) 81.1 - 52.0 (%)  POCT I-STAT TROPONIN I      Component Value Range   Troponin i, poc 0.00  0.00 - 0.08 (ng/mL)   Comment 3             Imaging Studies: Dg Chest 2  View  04/20/2011  *RADIOLOGY REPORT*  Clinical Data: Chest pain.  Short of breath and chills.  CHEST - 2 VIEW  Comparison: 03/22/2011.  Findings: Cardiopericardial silhouette appears unchanged. Mediastinal contours normal.  Bilateral basilar atelectasis is present which is more pronounced than on the prior exam.  No focal airspace disease or consolidation.  Lung volumes are slightly lower than on the prior examination.  Mild tortuosity of the thoracic aorta.  IMPRESSION: No acute cardiopulmonary disease.  Increasing basilar atelectasis. No airspace disease/pneumonia.  Original Report Authenticated By: Andreas Newport, M.D.   EKG Interpretation:  Date & Time: 04/20/2011 11:11 PM  Rate: 84  Rhythm: normal sinus rhythm  QRS Axis: normal  Intervals: normal  ST/T Wave abnormalities: normal  Conduction Disutrbances:none  Narrative Interpretation:   Old EKG Reviewed: unchanged  2:01 AM Nonsustained V. tach noted on monitor. Dr. Melvenia Beam of [facility] will admit the patient.             Hanley Seamen, MD 04/20/11 0201

## 2011-04-20 NOTE — ED Notes (Signed)
Pt to ED for eval of chest pain; pt reports chest pain started yesterday, and got worse today, describes as sharp/stabbing/ pressure, a/w cough, SOB, diaphoretic, nausea, and chills; pt with clear lungs; reports that he does smoke 1ppd, ex=drug abuser, last time used crack/cocaine was about a week ago; pt also reports drinking 12 pack of beer per day; pt a&OX4, NAD, c/o pain

## 2011-04-20 NOTE — ED Notes (Signed)
Gave Report to [facility]. Made her aware of pts  Runs of VTACH. No questions or concerns.
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
## 2011-04-20 NOTE — ED Notes (Signed)
cbg 82 

## 2011-04-20 NOTE — Consult Note (Signed)
Reason for Consult: eval of non sustained VT Referring Physician: [facility]  Brian [name] is an 56 y.o. male.  HPI: Pt is a Wm presenting to [facility] ED with c/o of non exertional CP with primary a pleuritic, musculoskeletal presentation, almost d/c from the ED but had a run of greater 20 beats of non sustained VT. Pt denies any related increased WOB, syncope, nausea or exacerbated CP sx. Pt denies any coronary h/o ie known CAD, prior MI, CM or CHF. Pt also denies h/o of prior CVA, TIA, PAD, PVD and or known ACS risk factors ie HLD, CKD,or Thyroid D/z. Pt had a hx of polysubstance abuse with recent cannibus, crack and ETOH consumption. Pt is a smoker of one PPD but denies family hx of premature CAD/AMI  Past Medical History  Diagnosis Date  . Heart failure   . Arthritis   . Alcohol abuse     History reviewed. No pertinent past surgical history.  History reviewed. No pertinent family history.  Social History:  reports that he has been smoking Cigarettes.  He has been smoking about 2 packs per day. He does not have any smokeless tobacco history on file. He reports that he drinks alcohol. He reports that he uses illicit drugs (Marijuana and Cocaine).  Allergies: No Known Allergies  Medications: I have reviewed the patient's current medications.  Results for orders placed during the hospital encounter of 04/19/11 (from the past 48 hour(s))  CBC     Status: Abnormal   Collection Time   04/19/11 11:33 PM      Component Value Range Comment   WBC 7.9  4.0 - 10.5 (K/uL)    RBC 5.16  4.22 - 5.81 (MIL/uL)    Hemoglobin 17.2 (*) 13.0 - 17.0 (g/dL)    HCT 16.1  09.6 - 04.5 (%)    MCV 95.2  78.0 - 100.0 (fL)    MCH 33.3  26.0 - 34.0 (pg)    MCHC 35.0  30.0 - 36.0 (g/dL)    RDW 40.9  81.1 - 91.4 (%)    Platelets 235  150 - 400 (K/uL)   DIFFERENTIAL     Status: Normal   Collection Time   04/19/11 11:33 PM      Component Value Range Comment   Neutrophils Relative 54  43 - 77 (%)    Neutro  Abs 4.2  1.7 - 7.7 (K/uL)    Lymphocytes Relative 35  12 - 46 (%)    Lymphs Abs 2.8  0.7 - 4.0 (K/uL)    Monocytes Relative 10  3 - 12 (%)    Monocytes Absolute 0.8  0.1 - 1.0 (K/uL)    Eosinophils Relative 1  0 - 5 (%)    Eosinophils Absolute 0.1  0.0 - 0.7 (K/uL)    Basophils Relative 0  0 - 1 (%)    Basophils Absolute 0.0  0.0 - 0.1 (K/uL)   POCT I-STAT TROPONIN I     Status: Normal   Collection Time   04/19/11 11:47 PM      Component Value Range Comment   Troponin i, poc 0.00  0.00 - 0.08 (ng/mL)    Comment 3            POCT I-STAT, CHEM 8     Status: Abnormal   Collection Time   04/19/11 11:51 PM      Component Value Range Comment   Sodium 142  135 - 145 (mEq/L)    Potassium 4.7  3.5 - 5.1 (mEq/L)    Chloride 102  96 - 112 (mEq/L)    BUN 7  6 - 23 (mg/dL)    Creatinine, Ser 2.20  0.50 - 1.35 (mg/dL)    Glucose, Bld 86  70 - 99 (mg/dL)    Calcium, Ion 2.54  1.12 - 1.32 (mmol/L)    TCO2 28  0 - 100 (mmol/L)    Hemoglobin 18.0 (*) 13.0 - 17.0 (g/dL)    HCT 27.0 (*) 62.3 - 52.0 (%)   POCT I-STAT TROPONIN I     Status: Normal   Collection Time   04/20/11  2:30 AM      Component Value Range Comment   Troponin i, poc 0.00  0.00 - 0.08 (ng/mL)    Comment 3              Dg Chest 2 View  04/20/2011  *RADIOLOGY REPORT*  Clinical Data: Chest pain.  Short of breath and chills.  CHEST - 2 VIEW  Comparison: 03/22/2011.  Findings: Cardiopericardial silhouette appears unchanged. Mediastinal contours normal.  Bilateral basilar atelectasis is present which is more pronounced than on the prior exam.  No focal airspace disease or consolidation.  Lung volumes are slightly lower than on the prior examination.  Mild tortuosity of the thoracic aorta.  IMPRESSION: No acute cardiopulmonary disease.  Increasing basilar atelectasis. No airspace disease/pneumonia.  Original Report Authenticated By: Andreas Newport, M.D.    Review of Systems  Constitutional: Positive for diaphoresis.  HENT: Negative.     Eyes: Negative.   Respiratory: Positive for cough.   Cardiovascular: Positive for chest pain.  Gastrointestinal: Negative.   Genitourinary: Negative.   Musculoskeletal: Positive for myalgias.  Skin: Negative.   Neurological: Negative.   Endo/Heme/Allergies: Negative.   Psychiatric/Behavioral: Positive for substance abuse. The patient is nervous/anxious.    Blood pressure 123/70, pulse 79, temperature 98.2 F (36.8 C), temperature source Oral, resp. rate 26, SpO2 92.00%. Physical Exam  Gen: somnolent  WM NAD Integument: pale. Diaphoretic HEENT: conjunctiva ejected Neck: soft and supple without JVD Pulm: decreased aeration with exp wheezes Cardio: audible s1,s2 without M/G/R Ext: non edematous LE bilat   Assessment/Plan: 1) Non sustained VT, r/o ACS, CM with poor EF, electrolyte disarray, secondary to polysubstance abuse, thyroid d/o. Plan will proceed with 2D echo, cycle cardiac bio markers q 6 hour times three, ACS protocol started.  Brian [name] 04/20/2011, 3:32 AM   Agree with note written by Donell Sievert PAC  Pt with 40 pack year H/O tob, as well as pot/cocaine abuse 1 week PTA. Admitted with [place]. Pain waxed and waned all day, with increased SOB and LUE radiation. Prior to being D/Cd from the ER he was noted to have a 20 beat run of NSVT. No recurrent CP. Exam benign. EKG w/o acute changes. Labs OK. Enz neg. Feel best option is cath to R/O CAD. Risks and benefits explained and pt is agreeable.  Runell Gess 04/20/2011 5:38 PM

## 2011-04-20 NOTE — Progress Notes (Signed)
  Echocardiogram 2D Echocardiogram has been performed.  Brian [name] 04/20/2011, 9:44 AM

## 2011-04-21 ENCOUNTER — Encounter (HOSPITAL_COMMUNITY): Admission: EM | Discharge status: Against Medical Advice | Payer: Self-pay | Source: Home / Self Care

## 2011-04-21 ENCOUNTER — Other Ambulatory Visit: Payer: Self-pay

## 2011-04-21 LAB — BASIC METABOLIC PANEL
BUN: 14 mg/dL (ref 6–23)
CO2: 30 mEq/L (ref 19–32)
Chloride: 100 mEq/L (ref 96–112)
Glucose, Bld: 100 mg/dL — ABNORMAL HIGH (ref 70–99)
Potassium: 4 mEq/L (ref 3.5–5.1)
Sodium: 139 mEq/L (ref 135–145)

## 2011-04-21 LAB — CBC
HCT: 49.4 % (ref 39.0–52.0)
Hemoglobin: 16.6 g/dL (ref 13.0–17.0)
MCH: 32.2 pg (ref 26.0–34.0)
MCV: 95.7 fL (ref 78.0–100.0)
RBC: 5.16 MIL/uL (ref 4.22–5.81)

## 2011-04-21 SURGERY — LEFT HEART CATHETERIZATION WITH CORONARY ANGIOGRAM
Anesthesia: LOCAL

## 2011-04-21 MED ORDER — ALBUTEROL SULFATE (5 MG/ML) 0.5% IN NEBU
2.5000 mg | INHALATION_SOLUTION | Freq: Four times a day (QID) | RESPIRATORY_TRACT | Status: DC | PRN
  Filled 2011-04-21: qty 0.5

## 2011-04-21 MED ORDER — IPRATROPIUM BROMIDE 0.02 % IN SOLN
0.5000 mg | Freq: Four times a day (QID) | RESPIRATORY_TRACT | Status: DC
  Administered 2011-04-21 – 2011-04-22 (×2): 0.5 mg via RESPIRATORY_TRACT
  Filled 2011-04-21 (×2): qty 2.5

## 2011-04-21 MED ORDER — ALBUTEROL SULFATE (5 MG/ML) 0.5% IN NEBU
2.5000 mg | INHALATION_SOLUTION | Freq: Four times a day (QID) | RESPIRATORY_TRACT | Status: DC
  Administered 2011-04-21 – 2011-04-22 (×2): 2.5 mg via RESPIRATORY_TRACT
  Filled 2011-04-21: qty 0.5

## 2011-04-21 MED ORDER — METOPROLOL TARTRATE 25 MG PO TABS
25.0000 mg | ORAL_TABLET | Freq: Two times a day (BID) | ORAL | Status: DC
  Administered 2011-04-21 (×2): 25 mg via ORAL
  Filled 2011-04-21 (×4): qty 1

## 2011-04-21 NOTE — Progress Notes (Signed)
Subjective:55yoM with h/o alcoholism, crack/cocaine abuse, homelessness, prior admission for chest  pain during substance abuse, admit with cough, congestion, chest pain, found to have  runs of WCT / VTach.    Pt. not sure about cardiac cath. He is anxious about undergoing procedure.  He did have more NSVT last pm. No chest pain.   Objective: Vital signs in last 24 hours: Temp:  [97.8 F (36.6 C)-98.1 F (36.7 C)] 97.9 F (36.6 C) (01/11 0500) Pulse Rate:  [75-103] 79  (01/11 0500) Resp:  [20-24] 20  (01/11 0500) BP: (103-161)/(75-103) 123/76 mmHg (01/11 0500) SpO2:  [90 %-98 %] 96 % (01/11 0753) Weight:  [94.5 kg (208 lb 5.4 oz)-95.255 kg (210 lb)] 94.5 kg (208 lb 5.4 oz) (01/11 0500) Weight change:  Last BM Date: 04/20/11 Intake/Output from previous day: 01/10 0701 - 01/11 0700 In: 963 [P.O.:960; I.V.:3] Out: 280 [Urine:280] Intake/Output this shift:    PE: General: A&O, pleasant affect.  Heart:S1S2 RRR, muffled due to lung sounds Lungs: + rhonchi throughout -no rales. Abd:+BS, soft, non tender. Ext: no edema, + pulses   Lab Results:  Basename 04/19/11 2351 04/19/11 2333  WBC -- 7.9  HGB 18.0* 17.2*  HCT 53.0* 49.1  PLT -- 235   BMET  Basename 04/19/11 2351  NA 142  K 4.7  CL 102  CO2 --  GLUCOSE 86  BUN 7  CREATININE 1.10  CALCIUM --    Basename 04/20/11 1144 04/20/11 0535  TROPONINI <0.30 <0.30    Lab Results  Component Value Date   CHOL 124 04/20/2011   HDL 64 04/20/2011   LDLCALC 50 04/20/2011   TRIG 52 04/20/2011   CHOLHDL 1.9 04/20/2011   Lab Results  Component Value Date   HGBA1C 5.6 01/25/2011     Lab Results  Component Value Date   TSH 0.810 04/20/2011    Hepatic Function Panel No results found for this basename: PROT,ALBUMIN,AST,ALT,ALKPHOS,BILITOT,BILIDIR,IBILI in the last 72 hours  Basename 04/20/11 0433  CHOL 124   No results found for this basename: PROTIME in the last 72 hours    EKG: Orders placed in visit on 04/21/11    . EKG 12-LEAD    Studies/Results: Dg Chest 2 View  04/20/2011  *RADIOLOGY REPORT*  Clinical Data: Chest pain.  Short of breath and chills.  CHEST - 2 VIEW  Comparison: 03/22/2011.  Findings: Cardiopericardial silhouette appears unchanged. Mediastinal contours normal.  Bilateral basilar atelectasis is present which is more pronounced than on the prior exam.  No focal airspace disease or consolidation.  Lung volumes are slightly lower than on the prior examination.  Mild tortuosity of the thoracic aorta.  IMPRESSION: No acute cardiopulmonary disease.  Increasing basilar atelectasis. No airspace disease/pneumonia.  Original Report Authenticated By: Andreas Newport, M.D.   2D Echo: Left ventricle: The cavity size was normal. Wall thickness was normal. Systolic function was normal. The estimated ejection fraction was in the range of 55% to 60%. Doppler parameters are consistent with abnormal left ventricular relaxation (grade 1 diastolic dysfunction). - Mitral valve: Mildly calcified annulus   Medications: I have reviewed the patient's current medications.    Marland Kitchen albuterol  2.5 mg Nebulization Q6H  . aspirin  324 mg Oral Pre-Cath  . aspirin  81 mg Oral Daily  . azithromycin  500 mg Oral Daily   Followed by  . azithromycin  250 mg Oral Daily  . diazepam  5 mg Oral On Call  . docusate sodium  100 mg Oral BID  .  folic acid  1 mg Oral Daily  . guaiFENesin  600 mg Oral BID  . ipratropium  0.5 mg Nebulization Q6H  . LORazepam  0-4 mg Oral Q6H   Followed by  . LORazepam  0-4 mg Oral Q12H  . mulitivitamin with minerals  1 tablet Oral Daily  . nicotine  7 mg Transdermal Daily  . rosuvastatin  40 mg Oral NOW   Followed by  . rosuvastatin  40 mg Oral Q0600  . senna  1 tablet Oral BID  . sodium chloride  3 mL Intravenous Q12H  . thiamine  100 mg Oral Daily  . DISCONTD: aspirin  81 mg Oral Daily    Assessment/Plan: Patient Active Problem List  Diagnoses  . Alcohol abuse  . COPD (chronic  obstructive pulmonary disease)  . Homelessness  . Cocaine abuse  . Chest pain  . Ventricular tachycardia   PLAN:  Pt. Would benefit from cardiac cath.  Dr. Tresa Endo to see him and discuss the procedure. Not on betablocker secondary to COPD.    On ETOH withdrawal medication with ativan.   LOS: 2 days   INGOLD,LAURA R 04/21/2011, 8:44 AM     Patient seen and examined. Agree with assessment and plan. I had lengthy discussion with pt concerning his VT and potential for cocaine mediated vasospasm/MI.  Pt does not want to proceed with definitive cath evaluation.. Will cancel cath.  Add low dose cardioselective beta-blockade.   Lennette Bihari, MD, [facility] 04/21/2011 9:48 AM

## 2011-04-21 NOTE — Progress Notes (Signed)
Subjective:  55yoM with chronic alcoholism, crack/cocaine abuse, homelessness, prior admission for chest pain during substance abuse, admit with cough, congestion, chest pain, found to have runs of WCT concerning for VTach in the ED. Refused cardiac cath on 04/21/11    Physical Exam: Blood pressure 127/92, pulse 99, temperature 97.9 F (36.6 C), temperature source Oral, resp. rate 20, height 5\' 6"  (1.676 m), weight 94.5 kg (208 lb 5.4 oz), SpO2 96.00%.  Arousable Still alert to place person and time Still calm and cooperative Abdomen is soft nontender Chest barrel-shaped, rhonchi.    Investigations:  No results found for this or any previous visit (from the past 240 hour(s)).   Basic Metabolic Panel:  Basename 04/21/11 0938 04/20/11 0434 04/19/11 2351  NA 139 -- 142  K 4.0 -- 4.7  CL 100 -- 102  CO2 30 -- --  GLUCOSE 100* -- 86  BUN 14 -- 7  CREATININE 0.76 -- 1.10  CALCIUM 9.7 -- --  MG -- 2.0 --  PHOS -- -- --   Liver Function Tests: No results found for this basename: AST:2,ALT:2,ALKPHOS:2,BILITOT:2,PROT:2,ALBUMIN:2 in the last 72 hours   CBC:  Basename 04/21/11 0938 04/19/11 2351 04/19/11 2333  WBC 7.0 -- 7.9  NEUTROABS -- -- 4.2  HGB 16.6 18.0* --  HCT 49.4 53.0* --  MCV 95.7 -- 95.2  PLT 192 -- 235    Dg Chest 2 View  04/20/2011  *RADIOLOGY REPORT*  Clinical Data: Chest pain.  Short of breath and chills.  CHEST - 2 VIEW  Comparison: 03/22/2011.  Findings: Cardiopericardial silhouette appears unchanged. Mediastinal contours normal.  Bilateral basilar atelectasis is present which is more pronounced than on the prior exam.  No focal airspace disease or consolidation.  Lung volumes are slightly lower than on the prior examination.  Mild tortuosity of the thoracic aorta.  IMPRESSION: No acute cardiopulmonary disease.  Increasing basilar atelectasis. No airspace disease/pneumonia.  Original Report Authenticated By: Andreas Newport, M.D.      Medications:    Scheduled:    . albuterol  2.5 mg Nebulization Q6H  . aspirin  324 mg Oral Pre-Cath  . aspirin  81 mg Oral Daily  . azithromycin  250 mg Oral Daily  . diazepam  5 mg Oral On Call  . docusate sodium  100 mg Oral BID  . folic acid  1 mg Oral Daily  . guaiFENesin  600 mg Oral BID  . ipratropium  0.5 mg Nebulization Q6H  . LORazepam  0-4 mg Oral Q6H   Followed by  . LORazepam  0-4 mg Oral Q12H  . metoprolol tartrate  25 mg Oral BID  . mulitivitamin with minerals  1 tablet Oral Daily  . nicotine  7 mg Transdermal Daily  . rosuvastatin  40 mg Oral NOW   Followed by  . rosuvastatin  40 mg Oral Q0600  . senna  1 tablet Oral BID  . sodium chloride  3 mL Intravenous Q12H  . thiamine  100 mg Oral Daily  . DISCONTD: aspirin  81 mg Oral Daily    Impression:  Principal Problem:  *Chest pain Active Problems:  Alcohol abuse  COPD (chronic obstructive pulmonary disease)  Cocaine abuse  Ventricular tachycardia   Plan: Await final recs from cards Continue with vitamins for alcoholism Continue CIWA scale     LOS: 2 days   Martha Soltys, MD Pager: [phone] 04/21/2011, 2:11 PM

## 2011-04-21 NOTE — Progress Notes (Signed)
Pt. Has seen video, refuses to sign consent reporting he is not sure he wants to proceed with cath. Pt. Refuses groin prep will allow NPO status and to have 4A IVF. CIWA is currently 15 2mg  Ativan given. Will cont to monitor pt. Ed and emotional support given

## 2011-04-21 NOTE — Progress Notes (Signed)
Pt. Reports he is not having the cardiac cath today that he needs more time to think about it and cannot make a decision "just like that" he said he may do it later after he thinks about it some more. Pt. [id number]

## 2011-04-21 NOTE — Progress Notes (Deleted)
Pt. Has been NPO since MN however, pt. Would not allow groin prep as he reported he was not sure he was going to have the cardiac cath pt. Watched the video and Q&A time offered emotional support and pt. Ed given. Pt. Will let us know later if he decides he will have the cath

## 2011-04-21 NOTE — Progress Notes (Signed)
Pt smokes 1 ppd and says he's cut down from 2 ppd to 1 ppd and is interested in quitting. Pt in action stage and wants to quit gradually. Recommended 21 mg patch x 6 weeks, 14 mg patch x 2 weeks and 7 mg patch x 2 weeks. Discussed and wrote down patch use instructions for the pt. Referred to 1-800 quit now for f/u and support. Discussed oral fixation substitutes, second hand smoke and in home smoking policy. Reviewed and gave pt Written education/contact information.

## 2011-04-22 LAB — BASIC METABOLIC PANEL
BUN: 15 mg/dL (ref 6–23)
Calcium: 9.8 mg/dL (ref 8.4–10.5)
Creatinine, Ser: 0.95 mg/dL (ref 0.50–1.35)
GFR calc non Af Amer: >90 mL/min (ref >90)
Glucose, Bld: 119 mg/dL — ABNORMAL HIGH (ref 70–99)

## 2011-04-22 LAB — CBC
MCH: 32.3 pg (ref 26.0–34.0)
MCHC: 33.4 g/dL (ref 30.0–36.0)
Platelets: 198 10*3/uL (ref 150–400)

## 2011-04-22 MED ORDER — METOPROLOL TARTRATE 50 MG PO TABS
50.0000 mg | ORAL_TABLET | Freq: Two times a day (BID) | ORAL | Status: DC
  Filled 2011-04-22: qty 1

## 2011-04-22 NOTE — Progress Notes (Signed)
Subjective:  55yoM with chronic alcoholism, crack/cocaine abuse, homelessness, prior admission for chest pain during substance abuse, admit with cough, congestion, chest pain, found to have runs of WCT concerning for VTach in the ED. Refused cardiac cath on 04/21/11 Wants to go home Would not listen to my arguments.    Physical Exam: Blood pressure 158/97, pulse 80, temperature 98.7 F (37.1 C), temperature source Oral, resp. rate 19, height 5\' 6"  (1.676 m), weight 94.5 kg (208 lb 5.4 oz), SpO2 94.00%.  Patient Vitals for the past 24 hrs:  BP Temp Temp src Pulse Resp SpO2  04/22/11 0810 - - - - - 94 %  04/22/11 0500 158/97 mmHg 98.7 F (37.1 C) Oral 80  19  99 %  04/21/11 2100 122/67 mmHg 99.1 F (37.3 C) Oral 96  17  95 %  04/21/11 1657 135/88 mmHg - - 92  - -  04/21/11 1425 - - - - - 94 %  04/21/11 1230 136/85 mmHg 98.3 F (36.8 C) Oral 76  20  96 %  04/21/11 1155 127/92 mmHg - - 99  - -     Investigations:  No results found for this or any previous visit (from the past 240 hour(s)).   Basic Metabolic Panel:  Basename 04/22/11 0630 04/21/11 0938 04/20/11 0434  NA 139 139 --  K 4.2 4.0 --  CL 99 100 --  CO2 31 30 --  GLUCOSE 119* 100* --  BUN 15 14 --  CREATININE 0.95 0.76 --  CALCIUM 9.8 9.7 --  MG -- -- 2.0  PHOS -- -- --   Liver Function Tests: No results found for this basename: AST:2,ALT:2,ALKPHOS:2,BILITOT:2,PROT:2,ALBUMIN:2 in the last 72 hours   CBC:  Basename 04/22/11 0630 04/21/11 0938 04/19/11 2333  WBC 8.1 7.0 --  NEUTROABS -- -- 4.2  HGB 16.4 16.6 --  HCT 49.1 49.4 --  MCV 96.8 95.7 --  PLT 198 192 --    No results found.    Medications:  Scheduled:    . albuterol  2.5 mg Nebulization QID  . aspirin  81 mg Oral Daily  . azithromycin  250 mg Oral Daily  . diazepam  5 mg Oral On Call  . docusate sodium  100 mg Oral BID  . folic acid  1 mg Oral Daily  . guaiFENesin  600 mg Oral BID  . ipratropium  0.5 mg Nebulization QID  .  LORazepam  0-4 mg Oral Q6H   Followed by  . LORazepam  0-4 mg Oral Q12H  . metoprolol tartrate  25 mg Oral BID  . mulitivitamin with minerals  1 tablet Oral Daily  . nicotine  7 mg Transdermal Daily  . rosuvastatin  40 mg Oral Q0600  . senna  1 tablet Oral BID  . sodium chloride  3 mL Intravenous Q12H  . thiamine  100 mg Oral Daily  . DISCONTD: albuterol  2.5 mg Nebulization Q6H  . DISCONTD: ipratropium  0.5 mg Nebulization Q6H    Impression:  Principal Problem:  *Chest pain Active Problems:  Alcohol abuse  COPD (chronic obstructive pulmonary disease)  Cocaine abuse  Ventricular tachycardia   Plan: Leaving ama    LOS: 3 days   Jari Dipasquale, MD Pager: [phone] 04/22/2011, 8:38 AM

## 2011-04-22 NOTE — Progress Notes (Signed)
[facility] Progress Note  Subjective:  No CP or SOB  Objective:   Vital Signs in the last 24 hours: Temp:  [98.3 F (36.8 C)-99.1 F (37.3 C)] 98.7 F (37.1 C) (01/12 0500) Pulse Rate:  [76-99] 80  (01/12 0500) Resp:  [17-20] 19  (01/12 0500) BP: (122-158)/(67-97) 158/97 mmHg (01/12 0500) SpO2:  [94 %-99 %] 94 % (01/12 0810)  Intake/Output from previous day: 01/11 0701 - 01/12 0700 In: 1080 [P.O.:1080] Out: 1676 [Urine:1675; Stool:1]  Scheduled:   . albuterol  2.5 mg Nebulization QID  . aspirin  81 mg Oral Daily  . azithromycin  250 mg Oral Daily  . diazepam  5 mg Oral On Call  . docusate sodium  100 mg Oral BID  . folic acid  1 mg Oral Daily  . guaiFENesin  600 mg Oral BID  . ipratropium  0.5 mg Nebulization QID  . LORazepam  0-4 mg Oral Q6H   Followed by  . LORazepam  0-4 mg Oral Q12H  . metoprolol tartrate  25 mg Oral BID  . mulitivitamin with minerals  1 tablet Oral Daily  . nicotine  7 mg Transdermal Daily  . rosuvastatin  40 mg Oral Q0600  . senna  1 tablet Oral BID  . sodium chloride  3 mL Intravenous Q12H  . thiamine  100 mg Oral Daily  . DISCONTD: albuterol  2.5 mg Nebulization Q6H  . DISCONTD: ipratropium  0.5 mg Nebulization Q6H    Physical Exam:   General appearance: appears older than stated age; unkempt Neck: no adenopathy, no JVD, supple, symmetrical, trachea midline and thyroid not enlarged, symmetric, no tenderness/mass/nodules Lungs: diminished breath sounds bilaterally Heart: S1, S2 normal Abdomen: soft, non-tender; bowel sounds normal; no masses,  no organomegaly Extremities: extremities normal, atraumatic, no cyanosis or edema   Rate: 65 - 80  Rhythm: NSR; no further VT  Lab Results: Results for orders placed during the hospital encounter of 04/19/11 (from the past 48 hour(s))  GLUCOSE, CAPILLARY     Status: Normal   Collection Time   04/20/11 11:16 AM      Component Value Range Comment   Glucose-Capillary 82  70 - 99 (mg/dL)    Comment 1 Notify RN     CARDIAC PANEL(CRET KIN+CKTOT+MB+TROPI)     Status: Abnormal   Collection Time   04/20/11 11:44 AM      Component Value Range Comment   Total CK 119  7 - 232 (U/L)    CK, MB 3.2  0.3 - 4.0 (ng/mL)    Troponin I <0.30  <0.30 (ng/mL)    Relative Index 2.7 (*) 0.0 - 2.5    ETHANOL     Status: Normal   Collection Time   04/20/11  5:33 PM      Component Value Range Comment   Alcohol, Ethyl (B) <11  0 - 11 (mg/dL)   PROTIME-INR     Status: Normal   Collection Time   04/20/11  7:57 PM      Component Value Range Comment   Prothrombin Time 12.9  11.6 - 15.2 (seconds)    INR 0.95  0.00 - 1.49    BASIC METABOLIC PANEL     Status: Abnormal   Collection Time   04/21/11  9:38 AM      Component Value Range Comment   Sodium 139  135 - 145 (mEq/L)    Potassium 4.0  3.5 - 5.1 (mEq/L)    Chloride  100  96 - 112 (mEq/L)    CO2 30  19 - 32 (mEq/L)    Glucose, Bld 100 (*) 70 - 99 (mg/dL)    BUN 14  6 - 23 (mg/dL)    Creatinine, Ser 2.95  0.50 - 1.35 (mg/dL)    Calcium 9.7  8.4 - 10.5 (mg/dL)    GFR calc non Af Amer >90  >90 (mL/min)    GFR calc Af Amer >90  >90 (mL/min)   CBC     Status: Normal   Collection Time   04/21/11  9:38 AM      Component Value Range Comment   WBC 7.0  4.0 - 10.5 (K/uL)    RBC 5.16  4.22 - 5.81 (MIL/uL)    Hemoglobin 16.6  13.0 - 17.0 (g/dL)    HCT 62.1  30.8 - 65.7 (%)    MCV 95.7  78.0 - 100.0 (fL)    MCH 32.2  26.0 - 34.0 (pg)    MCHC 33.6  30.0 - 36.0 (g/dL)    RDW 84.6  96.2 - 95.2 (%)    Platelets 192  150 - 400 (K/uL)   PROTIME-INR     Status: Normal   Collection Time   04/21/11  9:38 AM      Component Value Range Comment   Prothrombin Time 13.6  11.6 - 15.2 (seconds)    INR 1.02  0.00 - 1.49    BASIC METABOLIC PANEL     Status: Abnormal   Collection Time   04/22/11  6:30 AM      Component Value Range Comment   Sodium 139  135 - 145 (mEq/L)    Potassium 4.2  3.5 - 5.1 (mEq/L)    Chloride 99   96 - 112 (mEq/L)    CO2 31  19 - 32 (mEq/L)    Glucose, Bld 119 (*) 70 - 99 (mg/dL)    BUN 15  6 - 23 (mg/dL)    Creatinine, Ser 8.41  0.50 - 1.35 (mg/dL)    Calcium 9.8  8.4 - 10.5 (mg/dL)    GFR calc non Af Amer >90  >90 (mL/min)    GFR calc Af Amer >90  >90 (mL/min)   CBC     Status: Normal   Collection Time   04/22/11  6:30 AM      Component Value Range Comment   WBC 8.1  4.0 - 10.5 (K/uL)    RBC 5.07  4.22 - 5.81 (MIL/uL)    Hemoglobin 16.4  13.0 - 17.0 (g/dL)    HCT 32.4  40.1 - 02.7 (%)    MCV 96.8  78.0 - 100.0 (fL)    MCH 32.3  26.0 - 34.0 (pg)    MCHC 33.4  30.0 - 36.0 (g/dL)    RDW 25.3  66.4 - 40.3 (%)    Platelets 198  150 - 400 (K/uL)     Basename 04/22/11 0630 04/21/11 0938  NA 139 139  K 4.2 4.0  CL 99 100  CO2 31 30  GLUCOSE 119* 100*  BUN 15 14  CREATININE 0.95 0.76    Basename 04/20/11 1144 04/20/11 0535  TROPONINI <0.30 <0.30   Hepatic Function Panel No results found for this basename: PROT,ALBUMIN,AST,ALT,ALKPHOS,BILITOT,BILIDIR,IBILI in the last 72 hours  Basename 04/21/11 0938  INR 1.02   Lipid Panel     Component Value Date/Time   CHOL 124 04/20/2011 0433   TRIG 52 04/20/2011 0433   HDL 64 04/20/2011 0433  CHOLHDL 1.9 04/20/2011 0433   VLDL 10 04/20/2011 0433   LDLCALC 50 04/20/2011 0433       Imaging:  EF 55 - 60% with Grade I diastolic dysfunction and MAC on echo.  Cardiac Studies:     Assessment/Plan:   Principal Problem:  *Chest pain Active Problems:  Alcohol abuse  COPD (chronic obstructive pulmonary disease)  Cocaine abuse  Ventricular tachycardia   No further VT since 4 am yesterday.  Tolerating initiation of low dose beta blocker; will titrate Lopressor to 50 mg bid.    Lennette Bihari, MD, [facility] 04/22/2011, 10:30 AM
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
## 2012-06-02 ENCOUNTER — Encounter (HOSPITAL_COMMUNITY): Payer: Self-pay | Admitting: Emergency Medicine

## 2012-06-02 ENCOUNTER — Emergency Department (HOSPITAL_COMMUNITY)
Admission: EM | Admit: 2012-06-02 | Discharge: 2012-06-03 | Disposition: A | Discharge status: Home / Self Care | Payer: Self-pay | Attending: Emergency Medicine | Admitting: Emergency Medicine

## 2012-06-02 DIAGNOSIS — F17200 Nicotine dependence, unspecified, uncomplicated: Secondary | ICD-10-CM | POA: Insufficient documentation

## 2012-06-02 DIAGNOSIS — I509 Heart failure, unspecified: Secondary | ICD-10-CM | POA: Insufficient documentation

## 2012-06-02 DIAGNOSIS — Z8674 Personal history of sudden cardiac arrest: Secondary | ICD-10-CM | POA: Insufficient documentation

## 2012-06-02 DIAGNOSIS — F1010 Alcohol abuse, uncomplicated: Secondary | ICD-10-CM | POA: Insufficient documentation

## 2012-06-02 DIAGNOSIS — F1410 Cocaine abuse, uncomplicated: Secondary | ICD-10-CM | POA: Insufficient documentation

## 2012-06-02 DIAGNOSIS — J4489 Other specified chronic obstructive pulmonary disease: Secondary | ICD-10-CM | POA: Insufficient documentation

## 2012-06-02 DIAGNOSIS — J449 Chronic obstructive pulmonary disease, unspecified: Secondary | ICD-10-CM | POA: Insufficient documentation

## 2012-06-02 DIAGNOSIS — F1910 Other psychoactive substance abuse, uncomplicated: Secondary | ICD-10-CM

## 2012-06-02 DIAGNOSIS — Z8739 Personal history of other diseases of the musculoskeletal system and connective tissue: Secondary | ICD-10-CM | POA: Insufficient documentation

## 2012-06-02 LAB — CBC
MCV: 92 fL (ref 78.0–100.0)
Platelets: 216 10*3/uL (ref 150–400)
RDW: 13.5 % (ref 11.5–15.5)
WBC: 8 10*3/uL (ref 4.0–10.5)

## 2012-06-02 LAB — RAPID URINE DRUG SCREEN, HOSP PERFORMED
Amphetamines: NOT DETECTED
Benzodiazepines: NOT DETECTED
Cocaine: POSITIVE — AB
Opiates: NOT DETECTED
Tetrahydrocannabinol: NOT DETECTED

## 2012-06-02 LAB — COMPREHENSIVE METABOLIC PANEL
AST: 33 U/L (ref 0–37)
Albumin: 4.1 g/dL (ref 3.5–5.2)
Chloride: 97 mEq/L (ref 96–112)
Creatinine, Ser: 0.81 mg/dL (ref 0.50–1.35)
Potassium: 4 mEq/L (ref 3.5–5.1)
Total Bilirubin: 0.2 mg/dL — ABNORMAL LOW (ref 0.3–1.2)

## 2012-06-02 MED ORDER — LORAZEPAM 1 MG PO TABS: 0.0000 mg | ORAL_TABLET | Freq: Two times a day (BID) | ORAL | Status: DC

## 2012-06-02 MED ORDER — ADULT MULTIVITAMIN W/MINERALS CH
1.0000 | ORAL_TABLET | Freq: Every day | ORAL | Status: DC
  Administered 2012-06-02 – 2012-06-03 (×2): 1 via ORAL
  Filled 2012-06-02 (×2): qty 1

## 2012-06-02 MED ORDER — LORAZEPAM 1 MG PO TABS: 1.0000 mg | ORAL_TABLET | Freq: Four times a day (QID) | ORAL | Status: DC | PRN

## 2012-06-02 MED ORDER — LORAZEPAM 2 MG/ML IJ SOLN: 1.0000 mg | Freq: Four times a day (QID) | INTRAMUSCULAR | Status: DC | PRN

## 2012-06-02 MED ORDER — LORAZEPAM 1 MG PO TABS
0.0000 mg | ORAL_TABLET | Freq: Four times a day (QID) | ORAL | Status: DC
  Administered 2012-06-02: 1 mg via ORAL
  Administered 2012-06-03: 2 mg via ORAL
  Administered 2012-06-03: 1 mg via ORAL
  Administered 2012-06-03: 2 mg via ORAL
  Filled 2012-06-02: qty 2
  Filled 2012-06-02 (×2): qty 1
  Filled 2012-06-02: qty 2

## 2012-06-02 MED ORDER — VITAMIN B-1 100 MG PO TABS
100.0000 mg | ORAL_TABLET | Freq: Every day | ORAL | Status: DC
  Administered 2012-06-02 – 2012-06-03 (×2): 100 mg via ORAL
  Filled 2012-06-02 (×2): qty 1

## 2012-06-02 MED ORDER — FOLIC ACID 1 MG PO TABS
1.0000 mg | ORAL_TABLET | Freq: Every day | ORAL | Status: DC
  Administered 2012-06-02 – 2012-06-03 (×2): 1 mg via ORAL
  Filled 2012-06-02 (×2): qty 1

## 2012-06-02 MED ORDER — THIAMINE HCL 100 MG/ML IJ SOLN: 100.0000 mg | Freq: Every day | INTRAMUSCULAR | Status: DC

## 2012-06-02 NOTE — ED Provider Notes (Signed)
History    This chart was scribed for non-physician practitioner working with Toy Baker, MD by Smitty Pluck, ED scribe. This patient was seen in room WTR2/[facility]PT2 and the patient's care was started at 8:26 PM.   CSN: [id number]  Arrival date & time 06/02/12  2004      Chief Complaint  Patient presents with  . Drug / Alcohol Assessment    The history is provided by the patient. No language interpreter was used.   Brian [name] is a 57 y.o. male with h/o heart failure, COPD, alcohol abuse, CHF and cocaine abuse who presents to the Emergency Department with chief complaint of alcohol detox. He last had alcoholic beverage 45 minutes PTA. He denies SI ans HI. Pt denies auditory and visual hallucinations. Pt reports that he has been to multiple facilities for help. He states "I need some help." Pt denies fever, chills, nausea, vomiting, diarrhea, weakness, cough, SOB and any other pain.    Past Medical History  Diagnosis Date  . Heart failure   . Arthritis   . Alcohol abuse     Heavy alcohol abuse and homelessness  . Tobacco abuse   . Cocaine abuse     And crack   . Homelessness   . COPD (chronic obstructive pulmonary disease)   . Suicide attempt   . CHF (congestive heart failure)     Past Surgical History  Procedure Laterality Date  . Inner ear surgery      Family History  Problem Relation Age of Onset  . Coronary artery disease    . Diabetes      History  Substance Use Topics  . Smoking status: Current Every Day Smoker -- 2.00 packs/day    Types: Cigarettes  . Smokeless tobacco: Never Used  . Alcohol Use: Yes     Comment: Heavy.       Review of Systems 10 Systems reviewed and all are negative for acute change except as noted in the HPI.   Allergies  Review of patient's allergies indicates no known allergies.  Home Medications   Current Outpatient Rx  Name  Route  Sig  Dispense  Refill  . HYDROcodone-acetaminophen (NORCO) 5-325 MG per tablet  Oral   Take 1 tablet by mouth every 4 (four) hours as needed. For pain           There were no vitals taken for this visit.  Physical Exam  Nursing note and vitals reviewed. Constitutional: He is oriented to person, place, and time. He appears well-developed and well-nourished. No distress.  HENT:  Head: Normocephalic and atraumatic.  Eyes: EOM are normal.  Neck: Neck supple. No tracheal deviation present.  Cardiovascular: Normal rate, regular rhythm and normal heart sounds.  Exam reveals no gallop and no friction rub.   No murmur heard. Pulmonary/Chest: Effort normal and breath sounds normal. No respiratory distress. He has no wheezes.  Abdominal: He exhibits no distension.  Musculoskeletal: Normal range of motion.  Neurological: He is alert and oriented to person, place, and time.  Sensation and strength grossly intact throughout   Skin: Skin is warm and dry.  Psychiatric:  Intoxicated  Irrational Emotional     ED Course  Procedures (including critical care time) DIAGNOSTIC STUDIES: Filed Vitals:   06/03/12 2100  BP: 147/80  Pulse: 90  Temp: 98.1 F (36.7 C)  Resp: 20     COORDINATION OF CARE: 8:29 PM Discussed ED treatment with pt and pt agrees.     Labs  Reviewed  CBC  COMPREHENSIVE METABOLIC PANEL  ETHANOL  URINE RAPID DRUG SCREEN (HOSP PERFORMED)   Results for orders placed during the hospital encounter of 06/02/12  CBC      Result Value Range   WBC 8.0  4.0 - 10.5 K/uL   RBC 5.50  4.22 - 5.81 MIL/uL   Hemoglobin 17.6 (*) 13.0 - 17.0 g/dL   HCT 16.1  09.6 - 04.5 %   MCV 92.0  78.0 - 100.0 fL   MCH 32.0  26.0 - 34.0 pg   MCHC 34.8  30.0 - 36.0 g/dL   RDW 40.9  81.1 - 91.4 %   Platelets 216  150 - 400 K/uL  COMPREHENSIVE METABOLIC PANEL      Result Value Range   Sodium 134 (*) 135 - 145 mEq/L   Potassium 4.0  3.5 - 5.1 mEq/L   Chloride 97  96 - 112 mEq/L   CO2 24  19 - 32 mEq/L   Glucose, Bld 103 (*) 70 - 99 mg/dL   BUN 10  6 - 23 mg/dL    Creatinine, Ser 7.82  0.50 - 1.35 mg/dL   Calcium 8.8  8.4 - 95.6 mg/dL   Total Protein 7.7  6.0 - 8.3 g/dL   Albumin 4.1  3.5 - 5.2 g/dL   AST 33  0 - 37 U/L   ALT 39  0 - 53 U/L   Alkaline Phosphatase 39  39 - 117 U/L   Total Bilirubin 0.2 (*) 0.3 - 1.2 mg/dL   GFR calc non Af Amer >90  >90 mL/min   GFR calc Af Amer >90  >90 mL/min  ETHANOL      Result Value Range   Alcohol, Ethyl (B) 315 (*) 0 - 11 mg/dL  URINE RAPID DRUG SCREEN (HOSP PERFORMED)      Result Value Range   Opiates NONE DETECTED  NONE DETECTED   Cocaine POSITIVE (*) NONE DETECTED   Benzodiazepines NONE DETECTED  NONE DETECTED   Amphetamines NONE DETECTED  NONE DETECTED   Tetrahydrocannabinol NONE DETECTED  NONE DETECTED   Barbiturates NONE DETECTED  NONE DETECTED   Dg Chest 2 View  06/03/2012  *RADIOLOGY REPORT*  Clinical Data: Hypoxia.  CHEST - 2 VIEW  Comparison: 04/19/2011  Findings: Shallow inspiration with suggestion of atelectasis in the lung bases.  Hyperinflation probably due to emphysema.  Normal heart size and pulmonary vascularity.  No focal consolidation.  No blunting of costophrenic angles.  No pneumothorax.  Mediastinal contours appear intact.  IMPRESSION: Shallow inspiration with bibasilar atelectasis.   Original Report Authenticated By: Burman Nieves, M.D.       1. Substance abuse       MDM        I personally performed the services described in this documentation, which was scribed in my presence. The recorded information has been reviewed and is accurate.    Roxy Horseman, PA-C 06/19/12 [phone]

## 2012-06-02 NOTE — ED Notes (Signed)
Pt alert, arrives from homeless, requesting detox from alcohol, pt + ETOH, resp even unlabored, skin pwd, denies SI/HI

## 2012-06-03 ENCOUNTER — Emergency Department (HOSPITAL_COMMUNITY): Payer: Self-pay

## 2012-06-03 MED ORDER — PREDNISONE 20 MG PO TABS
60.0000 mg | ORAL_TABLET | Freq: Every day | ORAL | Status: DC
  Administered 2012-06-03: 60 mg via ORAL
  Filled 2012-06-03 (×2): qty 3

## 2012-06-03 MED ORDER — ALBUTEROL SULFATE (5 MG/ML) 0.5% IN NEBU: 5.0000 mg | INHALATION_SOLUTION | RESPIRATORY_TRACT | Status: DC | PRN

## 2012-06-03 MED ORDER — ALBUTEROL SULFATE HFA 108 (90 BASE) MCG/ACT IN AERS: 2.0000 | INHALATION_SPRAY | RESPIRATORY_TRACT | Status: DC | PRN

## 2012-06-03 NOTE — Progress Notes (Addendum)
Pt stated that he did not want to go to [facility], and wanted to go to [facility]. Per [facility], patient was accepted to [facility] first, and can not offer admission to him at this time. Pt stated he would rather go back to the street then go to [facility]. CSW informed RN and EDP.   Marland KitchenCatha Gosselin, Theresia Majors  [phone] .06/03/2012 1907pm   CSW met with pt to give him outpatient resources. Pt stated he was interested in going to [facility] for detox. [facility] is arranging transport. CSW awaiting call back.   Catha Gosselin, LCSWA  [phone] .06/03/2012 1925pm   Pt [facility] at 9pm.   .Catha Gosselin, LCSWA  [phone] .06/03/2012 2007pm

## 2012-06-03 NOTE — ED Provider Notes (Signed)
Brian [name] is a 57 y.o. male homeless here requesting detox. Hx of COPD with med uncompliance. O2 around 88% on RA, CXR showed no pneumonia. Was intoxicated last night, UTOX + cocaine but denies chest pain. He was not given steroids overnight. Given med uncompliance, continual smoking habits, and hypoxia, will start 5 days of steroids and give prn albuterol. Will have psych eval to see if he requires inpatient detox.    Richardean Canal, MD 06/03/12 [phone]

## 2012-06-03 NOTE — ED Notes (Signed)
2 bags placed in locker in 30

## 2012-06-03 NOTE — BHH Counselor (Signed)
Pt referred to [facility]. Faxed information to the facility. Everlean Alstrom verified that beds are available. Pt currently under review.

## 2012-06-03 NOTE — Progress Notes (Signed)
Pt confirms he does not have a pcp

## 2012-06-03 NOTE — Progress Notes (Signed)
Patient accepted to [facility] per discussion with act team. Per act team, patient transportation to be provided by [facility] between 7 and 730pm   .Catha Gosselin, Theresia Majors  [phone] .06/03/2012 1843pm

## 2012-06-03 NOTE — BH Assessment (Addendum)
Assessment Note   Brian [name] is an 57 y.o. male. Pt presents voluntarily to [facility] with request for detox. His BAL was 315 upon arrival. Pt reports that he needs detox b/c he is afraid he will be dead soon if he doesn't stop drinking. Pt last used substances 06/02/12. He reports drinking "at least two 40 oz beers" and using crack cocaine two days ago. Pt reports he is homeless and is sleeping under bridges.  He says his two sons and his brother aren't supportive of him. Pt denies SI and HI. He denies [facility] and no delusions noted. Pt is disheveled and malodorous. He has hx of COPD and congestive heart failure. He reports depressed mood with worthlessness and isolating behavior. Pt has been to [facility], [facility], [facility], & [facility] in the past for substance abuse/detox. No withdrawal sxs reported yet.  Axis I: Alcohol Dependence           Mood Disorder NOS Axis II: Deferred Axis III:  Past Medical History  Diagnosis Date  . Heart failure   . Arthritis   . Alcohol abuse     Heavy alcohol abuse and homelessness  . Tobacco abuse   . Cocaine abuse     And crack   . Homelessness   . COPD (chronic obstructive pulmonary disease)   . Suicide attempt   . CHF (congestive heart failure)    Axis IV: economic problems, housing problems, other psychosocial or environmental problems, problems related to social environment and problems with primary support group Axis V: 41-50 serious symptoms  Past Medical History:  Past Medical History  Diagnosis Date  . Heart failure   . Arthritis   . Alcohol abuse     Heavy alcohol abuse and homelessness  . Tobacco abuse   . Cocaine abuse     And crack   . Homelessness   . COPD (chronic obstructive pulmonary disease)   . Suicide attempt   . CHF (congestive heart failure)     Past Surgical History  Procedure Laterality Date  . Inner ear surgery      Family History:  Family History  Problem Relation Age of Onset  . Coronary artery disease    .  Diabetes      Social History:  reports that he has been smoking Cigarettes.  He has been smoking about 2.00 packs per day. He has never used smokeless tobacco. He reports that  drinks alcohol. He reports that he uses illicit drugs (Marijuana and Cocaine).  Additional Social History:  Alcohol / Drug Use Pain Medications: none Prescriptions: none Over the Counter: none History of alcohol / drug use?: Yes Negative Consequences of Use: Financial;Legal;Personal relationships;Work / Mining engineer #1 Name of Substance 1: alcohol 1 - Age of First Use: 18 1 - Amount (size/oz): 2 cases beer 1 - Frequency: daily 1 - Duration: years 1 - Last Use / Amount: 06/02/12 - two 40 oz beers Substance #2 Name of Substance 2: crack cocaine 2 - Age of First Use: 33 2 - Amount (size/oz): varies 2 - Frequency: varies 2 - Duration: years 2 - Last Use / Amount: 06/01/12 - $40  CIWA: CIWA-Ar BP: 137/77 mmHg Pulse Rate: 107 Nausea and Vomiting: mild nausea with no vomiting Tactile Disturbances: none Tremor: not visible, but can be felt fingertip to fingertip Auditory Disturbances: not present Paroxysmal Sweats: barely perceptible sweating, palms moist Visual Disturbances: not present Anxiety: two Headache, Fullness in Head: severe Agitation: normal activity Orientation and Clouding  of Sensorium: oriented and can do serial additions CIWA-Ar Total: 10 COWS:    Allergies: No Known Allergies  Home Medications:  (Not in a hospital admission)  OB/GYN Status:  No LMP for male patient.  General Assessment Data Location of Assessment: [facility] Living Arrangements: Alone;Other (Comment) (homeless) Can pt return to current living arrangement?: Yes Admission Status: Voluntary Is patient capable of signing voluntary admission?: Yes Transfer from: Home Referral Source: Self/Family/Friend  Education Status Is patient currently in school?: No  Risk to self Suicidal Ideation: No Suicidal Intent: No Is  patient at risk for suicide?: No Suicidal Plan?: No-Not Currently/Within Last 6 Months Access to Means: No What has been your use of drugs/alcohol within the last 12 months?: daily drinker, frequent crack cocaine use Previous Attempts/Gestures: No How many times?: 0 Other Self Harm Risks: none Triggers for Past Attempts:  (none) Intentional Self Injurious Behavior: None Family Suicide History: No Recent stressful life event(s): Other (Comment);Financial Problems (homelessness, health problems) Persecutory voices/beliefs?: No Depression: Yes Depression Symptoms: Feeling worthless/self pity;Isolating Substance abuse history and/or treatment for substance abuse?: Yes Suicide prevention information given to non-admitted patients: Not applicable  Risk to Others Homicidal Ideation: No Thoughts of Harm to Others: No Current Homicidal Intent: No Current Homicidal Plan: No Access to Homicidal Means: No Identified Victim: none History of harm to others?: No Assessment of Violence: None Noted Violent Behavior Description: pt calm Does patient have access to weapons?: No Criminal Charges Pending?: No Does patient have a court date: No  Psychosis Hallucinations: None noted Delusions: None noted  Mental Status Report Appear/Hygiene: Disheveled;Poor hygiene;Body odor Eye Contact: Fair Motor Activity: Freedom of movement Speech: Logical/coherent;Slurred Level of Consciousness: Drowsy Mood: Depressed Affect: Appropriate to circumstance Anxiety Level: None Thought Processes: Coherent;Relevant Judgement: Impaired Orientation: Person;Place;Time;Situation Obsessive Compulsive Thoughts/Behaviors: None  Cognitive Functioning Concentration: Normal Memory: Recent Impaired;Remote Intact IQ: Average Insight: Fair Impulse Control: Poor Appetite: Good Weight Loss: 0 Weight Gain: 0 Sleep: No Change Total Hours of Sleep: 5 Vegetative Symptoms: None  ADLScreening [facility] Assessment  Services) Patient's cognitive ability adequate to safely complete daily activities?: Yes Patient able to express need for assistance with ADLs?: Yes Independently performs ADLs?: Yes (appropriate for developmental age)  Abuse/Neglect [facility]) Physical Abuse: Denies Verbal Abuse: Denies Sexual Abuse: Denies  Prior Inpatient Therapy Prior Inpatient [facility]: Yes [facility]Provider([facility]): also [facility], [facility] Reason for Treatment: substance abuse, detox  Prior Outpatient Therapy Prior Outpatient Therapy: No Prior Therapy Dates: na Prior Therapy Facilty/Provider(s): na Reason for Treatment: na  ADL Screening (condition at time of admission) Patient's cognitive ability adequate to safely complete daily activities?: Yes Patient able to express need for assistance with ADLs?: Yes Independently performs ADLs?: Yes (appropriate for developmental age)       Abuse/Neglect Assessment (Assessment to be complete while patient is alone) Physical Abuse: Denies Verbal Abuse: Denies Sexual Abuse: Denies Exploitation of patient/patient's resources: Denies Values / Beliefs Cultural Requests During Hospitalization: None Spiritual Requests During Hospitalization: None   Advance Directives (For Healthcare) Advance Directive: Patient does not have advance directive    Additional Information 1:1 In Past 12 Months?: No CIRT Risk: No Elopement Risk: No Does patient have medical clearance?: Yes     Disposition:  Disposition Initial Assessment Completed: Yes Disposition of Patient: Inpatient treatment program Type of inpatient treatment program: Adult  On Site Evaluation by:   Reviewed with Physician:     Donnamarie Rossetti P 06/03/2012 5:13  AM

## 2012-06-22 NOTE — ED Provider Notes (Signed)
Medical screening examination/treatment/procedure(s) were performed by non-physician practitioner and as supervising physician I was immediately available for consultation/collaboration.  Toy Baker, MD 06/22/12 [phone]

## 2012-07-01 NOTE — ED Provider Notes (Signed)
Medical screening examination/treatment/procedure(s) were performed by non-physician practitioner and as supervising physician I was immediately available for consultation/collaboration.  Toy Baker, MD 07/01/12 [phone]

## 2012-08-06 ENCOUNTER — Encounter (HOSPITAL_COMMUNITY): Payer: Self-pay | Admitting: Emergency Medicine

## 2012-08-06 ENCOUNTER — Emergency Department (HOSPITAL_COMMUNITY)
Admission: EM | Admit: 2012-08-06 | Discharge: 2012-08-08 | Disposition: A | Discharge status: Rehabilitation Facility | Payer: Self-pay | Attending: Emergency Medicine | Admitting: Emergency Medicine

## 2012-08-06 DIAGNOSIS — I509 Heart failure, unspecified: Secondary | ICD-10-CM | POA: Insufficient documentation

## 2012-08-06 DIAGNOSIS — F1010 Alcohol abuse, uncomplicated: Secondary | ICD-10-CM

## 2012-08-06 DIAGNOSIS — Z5900 Homelessness unspecified: Secondary | ICD-10-CM | POA: Insufficient documentation

## 2012-08-06 DIAGNOSIS — F1020 Alcohol dependence, uncomplicated: Secondary | ICD-10-CM | POA: Insufficient documentation

## 2012-08-06 DIAGNOSIS — R059 Cough, unspecified: Secondary | ICD-10-CM | POA: Insufficient documentation

## 2012-08-06 DIAGNOSIS — F1210 Cannabis abuse, uncomplicated: Secondary | ICD-10-CM | POA: Insufficient documentation

## 2012-08-06 DIAGNOSIS — Z8739 Personal history of other diseases of the musculoskeletal system and connective tissue: Secondary | ICD-10-CM | POA: Insufficient documentation

## 2012-08-06 DIAGNOSIS — J449 Chronic obstructive pulmonary disease, unspecified: Secondary | ICD-10-CM | POA: Insufficient documentation

## 2012-08-06 DIAGNOSIS — J4489 Other specified chronic obstructive pulmonary disease: Secondary | ICD-10-CM | POA: Insufficient documentation

## 2012-08-06 DIAGNOSIS — F17200 Nicotine dependence, unspecified, uncomplicated: Secondary | ICD-10-CM | POA: Insufficient documentation

## 2012-08-06 DIAGNOSIS — R05 Cough: Secondary | ICD-10-CM | POA: Insufficient documentation

## 2012-08-06 DIAGNOSIS — Z8659 Personal history of other mental and behavioral disorders: Secondary | ICD-10-CM | POA: Insufficient documentation

## 2012-08-06 DIAGNOSIS — Z8679 Personal history of other diseases of the circulatory system: Secondary | ICD-10-CM | POA: Insufficient documentation

## 2012-08-06 DIAGNOSIS — F1410 Cocaine abuse, uncomplicated: Secondary | ICD-10-CM | POA: Insufficient documentation

## 2012-08-06 NOTE — ED Notes (Signed)
PT. REQUESTING DETOX FOR COCAINE , ETOH ,MARIJUANA ADDICTION , DENIES SUICIDAL IDEATION . LAST COCAINE THIS EVENING , ALSO REPORTS DRANK ETOH THIS EVENING .

## 2012-08-07 ENCOUNTER — Emergency Department (HOSPITAL_COMMUNITY): Payer: Self-pay

## 2012-08-07 LAB — CBC WITH DIFFERENTIAL/PLATELET
Basophils Absolute: 0 10*3/uL (ref 0.0–0.1)
Basophils Relative: 0 % (ref 0–1)
Eosinophils Absolute: 0.1 10*3/uL (ref 0.0–0.7)
Eosinophils Relative: 2 % (ref 0–5)
HCT: 46.4 % (ref 39.0–52.0)
Lymphocytes Relative: 42 % (ref 12–46)
MCH: 32 pg (ref 26.0–34.0)
MCHC: 35.1 g/dL (ref 30.0–36.0)
MCV: 91.2 fL (ref 78.0–100.0)
Monocytes Absolute: 0.4 10*3/uL (ref 0.1–1.0)
Platelets: 241 10*3/uL (ref 150–400)
RDW: 14.1 % (ref 11.5–15.5)

## 2012-08-07 LAB — RAPID URINE DRUG SCREEN, HOSP PERFORMED
Amphetamines: NOT DETECTED
Benzodiazepines: NOT DETECTED
Cocaine: POSITIVE — AB
Opiates: NOT DETECTED

## 2012-08-07 LAB — COMPREHENSIVE METABOLIC PANEL
AST: 32 U/L (ref 0–37)
CO2: 28 mEq/L (ref 19–32)
Calcium: 9 mg/dL (ref 8.4–10.5)
Creatinine, Ser: 0.78 mg/dL (ref 0.50–1.35)
GFR calc Af Amer: >90 mL/min (ref >90)
GFR calc non Af Amer: >90 mL/min (ref >90)
Sodium: 145 mEq/L (ref 135–145)
Total Protein: 6.4 g/dL (ref 6.0–8.3)

## 2012-08-07 LAB — ETHANOL
Alcohol, Ethyl (B): 251 mg/dL — ABNORMAL HIGH (ref 0–11)
Alcohol, Ethyl (B): 119 mg/dL — ABNORMAL HIGH (ref 0–11)

## 2012-08-07 LAB — POCT I-STAT TROPONIN I: Troponin i, poc: 0 ng/mL (ref 0.00–0.08)

## 2012-08-07 MED ORDER — ACETAMINOPHEN 325 MG PO TABS: 650.0000 mg | ORAL_TABLET | ORAL | Status: DC | PRN

## 2012-08-07 MED ORDER — LORAZEPAM 2 MG/ML IJ SOLN
2.0000 mg | Freq: Once | INTRAMUSCULAR | Status: AC
  Administered 2012-08-07: 2 mg via INTRAMUSCULAR
  Filled 2012-08-07: qty 1

## 2012-08-07 MED ORDER — IPRATROPIUM BROMIDE 0.02 % IN SOLN
0.5000 mg | Freq: Once | RESPIRATORY_TRACT | Status: AC
  Administered 2012-08-07: 0.5 mg via RESPIRATORY_TRACT
  Filled 2012-08-07: qty 2.5

## 2012-08-07 MED ORDER — ALBUTEROL SULFATE HFA 108 (90 BASE) MCG/ACT IN AERS: 2.0000 | INHALATION_SPRAY | RESPIRATORY_TRACT | Status: DC | PRN

## 2012-08-07 MED ORDER — IBUPROFEN 200 MG PO TABS
600.0000 mg | ORAL_TABLET | Freq: Three times a day (TID) | ORAL | Status: DC | PRN
  Administered 2012-08-07: 600 mg via ORAL
  Filled 2012-08-07: qty 3

## 2012-08-07 MED ORDER — FOLIC ACID 1 MG PO TABS
1.0000 mg | ORAL_TABLET | Freq: Every day | ORAL | Status: DC
Start: 2012-08-07 — End: 2012-08-08
  Administered 2012-08-07 – 2012-08-08 (×2): 1 mg via ORAL
  Filled 2012-08-07 (×3): qty 1

## 2012-08-07 MED ORDER — ADULT MULTIVITAMIN W/MINERALS CH
1.0000 | ORAL_TABLET | Freq: Every day | ORAL | Status: DC
  Administered 2012-08-07 – 2012-08-08 (×2): 1 via ORAL
  Filled 2012-08-07 (×2): qty 1

## 2012-08-07 MED ORDER — VITAMIN B-1 100 MG PO TABS
100.0000 mg | ORAL_TABLET | Freq: Every day | ORAL | Status: DC
  Administered 2012-08-07 – 2012-08-08 (×2): 100 mg via ORAL
  Filled 2012-08-07 (×2): qty 1

## 2012-08-07 MED ORDER — POTASSIUM CHLORIDE CRYS ER 20 MEQ PO TBCR
40.0000 meq | EXTENDED_RELEASE_TABLET | Freq: Once | ORAL | Status: AC
  Administered 2012-08-07: 40 meq via ORAL
  Filled 2012-08-07: qty 2

## 2012-08-07 MED ORDER — LORAZEPAM 2 MG/ML IJ SOLN: 1.0000 mg | Freq: Four times a day (QID) | INTRAMUSCULAR | Status: DC | PRN

## 2012-08-07 MED ORDER — ALBUTEROL SULFATE (5 MG/ML) 0.5% IN NEBU
5.0000 mg | INHALATION_SOLUTION | Freq: Once | RESPIRATORY_TRACT | Status: AC
  Administered 2012-08-07: 5 mg via RESPIRATORY_TRACT
  Filled 2012-08-07: qty 1

## 2012-08-07 MED ORDER — LORAZEPAM 1 MG PO TABS
0.0000 mg | ORAL_TABLET | Freq: Four times a day (QID) | ORAL | Status: DC
  Administered 2012-08-07 (×2): 2 mg via ORAL
  Administered 2012-08-07 – 2012-08-08 (×3): 1 mg via ORAL
  Filled 2012-08-07: qty 2
  Filled 2012-08-07 (×2): qty 1
  Filled 2012-08-07 (×2): qty 2

## 2012-08-07 MED ORDER — LORAZEPAM 1 MG PO TABS
1.0000 mg | ORAL_TABLET | Freq: Four times a day (QID) | ORAL | Status: DC | PRN
  Administered 2012-08-08: 1 mg via ORAL
  Filled 2012-08-07: qty 1

## 2012-08-07 MED ORDER — THIAMINE HCL 100 MG/ML IJ SOLN: 100.0000 mg | Freq: Every day | INTRAMUSCULAR | Status: DC

## 2012-08-07 MED ORDER — NICOTINE 21 MG/24HR TD PT24
21.0000 mg | MEDICATED_PATCH | Freq: Every day | TRANSDERMAL | Status: DC
  Administered 2012-08-07 – 2012-08-08 (×2): 21 mg via TRANSDERMAL
  Filled 2012-08-07 (×2): qty 1

## 2012-08-07 MED ORDER — ZOLPIDEM TARTRATE 5 MG PO TABS: 5.0000 mg | ORAL_TABLET | Freq: Every evening | ORAL | Status: DC | PRN

## 2012-08-07 MED ORDER — LORAZEPAM 1 MG PO TABS: 0.0000 mg | ORAL_TABLET | Freq: Two times a day (BID) | ORAL | Status: DC

## 2012-08-07 MED ORDER — ONDANSETRON HCL 8 MG PO TABS: 4.0000 mg | ORAL_TABLET | Freq: Three times a day (TID) | ORAL | Status: DC | PRN

## 2012-08-07 NOTE — ED Provider Notes (Signed)
This morning patient just states that he feels bad but does endorse that he still wants alcohol detox. Blood alcohol level checked at 6 AM this morning was 119. Currently under review by [facility].  Patient does have chronic lung disease but does not use inhalers regularly. We'll give him albuterol when necessary here for wheezing and shortness of breath.  Gwyneth Sprout, MD 08/07/12 [phone]

## 2012-08-07 NOTE — BH Assessment (Signed)
Assessment Note   Brian [name] is an 57 y.o. male.  Pt came to [facility] seeking detox from ETOH.  Patient denies any SI, HI or A/V hallucinations.  Patient admits to drinking about a case of beer per day for the last 4-5 years.  His last drink was on 04/29 prior to arrival at ED.  Patient has been using about $50 worth of cocaine at a time with a rate of 3x/W for the last year.  Last cocaine use was also prior to arrival.  Withdrawal symptoms include: nausea/vomiting, tremors, sweats/fever/chills.  Patient was in detox at [facility] in [facility] '14.  Patient will be referred to [facility].   Axis I: 303.90 ETOH dependence, 305.60 Cocaine abuse Axis II: Deferred Axis III:  Past Medical History  Diagnosis Date  . Heart failure   . Arthritis   . Alcohol abuse     Heavy alcohol abuse and homelessness  . Tobacco abuse   . Cocaine abuse     And crack   . Homelessness   . COPD (chronic obstructive pulmonary disease)   . Suicide attempt   . CHF (congestive heart failure)    Axis IV: economic problems, housing problems and occupational problems Axis V: 31-40 impairment in reality testing  Past Medical History:  Past Medical History  Diagnosis Date  . Heart failure   . Arthritis   . Alcohol abuse     Heavy alcohol abuse and homelessness  . Tobacco abuse   . Cocaine abuse     And crack   . Homelessness   . COPD (chronic obstructive pulmonary disease)   . Suicide attempt   . CHF (congestive heart failure)     Past Surgical History  Procedure Laterality Date  . Inner ear surgery      Family History:  Family History  Problem Relation Age of Onset  . Coronary artery disease    . Diabetes      Social History:  reports that he has been smoking Cigarettes.  He has been smoking about 2.00 packs per day. He has never used smokeless tobacco. He reports that  drinks alcohol. He reports that he uses illicit drugs (Marijuana and Cocaine).  Additional Social History:  Alcohol / Drug Use Pain  Medications: None Prescriptions: N/A Over the Counter: N/A History of alcohol / drug use?: Yes Longest period of sobriety (when/how long): "I can't remember." Withdrawal Symptoms: Cramps;Diarrhea;Fever / Chills;Nausea / Vomiting;Patient aware of relationship between substance abuse and physical/medical complications;Sweats;Tachycardia;Tingling;Tremors;Weakness Substance #1 Name of Substance 1: ETOH, usually beer 1 - Age of First Use: teens 1 - Amount (size/oz): One case daily 1 - Frequency: Daily consumption 1 - Duration: On-going for last five years 1 - Last Use / Amount: 04/29, drank about 18 beers in preceeding 24 hours Substance #2 Name of Substance 2: Cocaine 2 - Age of First Use: 20's 2 - Amount (size/oz): $50 at a time 2 - Frequency: "About three times per week." 2 - Duration: On-going 2 - Last Use / Amount: 04/29.  Cannot recall how much cocaine he has had recently  CIWA: CIWA-Ar BP: 102/62 mmHg Pulse Rate: 100 Nausea and Vomiting: mild nausea with no vomiting Tactile Disturbances: very mild itching, pins and needles, burning or numbness Tremor: not visible, but can be felt fingertip to fingertip Auditory Disturbances: not present Paroxysmal Sweats: barely perceptible sweating, palms moist Visual Disturbances: not present Anxiety: no anxiety, at ease Headache, Fullness in Head: very mild Agitation: normal activity Orientation and Clouding of  Sensorium: cannot do serial additions or is uncertain about date CIWA-Ar Total: 6 COWS:    Allergies: No Known Allergies  Home Medications:  (Not in a hospital admission)  OB/GYN Status:  No LMP for male patient.  General Assessment Data Location of Assessment: [facility] ED Living Arrangements: Other (Comment) (Homeless for last 2-3 years) Can pt return to current living arrangement?: Yes Admission Status: Voluntary Is patient capable of signing voluntary admission?: Yes Transfer from: Acute Hospital Referral Source:  Self/Family/Friend     Risk to self Suicidal Ideation: No Suicidal Intent: No Is patient at risk for suicide?: No Suicidal Plan?: No Access to Means: No What has been your use of drugs/alcohol within the last 12 months?: Daily use of ETOH Previous Attempts/Gestures: No How many times?: 0 Other Self Harm Risks: SA issues Triggers for Past Attempts: None known Intentional Self Injurious Behavior: None Family Suicide History: No Recent stressful life event(s): Other (Comment) (Daily life of homelessness) Persecutory voices/beliefs?: No Depression: Yes Depression Symptoms: Despondent;Fatigue;Loss of interest in usual pleasures;Feeling worthless/self pity Substance abuse history and/or treatment for substance abuse?: Yes Suicide prevention information given to non-admitted patients: Not applicable  Risk to Others Homicidal Ideation: No Thoughts of Harm to Others: No Current Homicidal Intent: No Current Homicidal Plan: No Access to Homicidal Means: No Identified Victim: No one History of harm to others?: No Assessment of Violence: None Noted Violent Behavior Description: Pt calm and courteous Does patient have access to weapons?: No Criminal Charges Pending?: No Does patient have a court date: No  Psychosis Hallucinations: None noted Delusions: None noted  Mental Status Report Appear/Hygiene: Disheveled;Body odor Eye Contact: Fair Motor Activity: Freedom of movement;Unremarkable Speech: Logical/coherent Level of Consciousness: Quiet/awake Mood: Depressed;Despair Affect: Sad Anxiety Level: Moderate Thought Processes: Coherent;Relevant Judgement: Impaired Orientation: Person;Place;Situation Obsessive Compulsive Thoughts/Behaviors: None  Cognitive Functioning Concentration: Decreased Memory: Recent Impaired;Remote Intact IQ: Average Insight: Fair Impulse Control: Poor Appetite: Poor Weight Loss: 0 Weight Gain: 0 Sleep: Decreased Total Hours of Sleep:   (<4H/D) Vegetative Symptoms: None  ADLScreening [facility] Assessment Services) Patient's cognitive ability adequate to safely complete daily activities?: Yes Patient able to express need for assistance with ADLs?: Yes Independently performs ADLs?: Yes (appropriate for developmental age)  Abuse/Neglect [facility]) Physical Abuse: Denies Verbal Abuse: Denies Sexual Abuse: Denies  Prior Inpatient Therapy Prior Inpatient Therapy: Yes Prior Therapy Dates: 06-03-12 Prior Therapy Facilty/Provider(s): [facility] Reason for Treatment: Detox  Prior Outpatient Therapy Prior Outpatient Therapy: No Prior Therapy Dates: N/A Prior Therapy Facilty/Provider(s): N/A Reason for Treatment: N/A  ADL Screening (condition at time of admission) Patient's cognitive ability adequate to safely complete daily activities?: Yes Patient able to express need for assistance with ADLs?: Yes Independently performs ADLs?: Yes (appropriate for developmental age) Weakness of Legs: Both (Weakness in knees sometimes) Weakness of Arms/Hands: None       Abuse/Neglect Assessment (Assessment to be complete while patient is alone) Physical Abuse: Denies Verbal Abuse: Denies Sexual Abuse: Denies Exploitation of patient/patient's resources: Denies Self-Neglect: Denies     Merchant navy officer (For Healthcare) Advance Directive: Patient does not have advance directive;Patient would not like information    Additional Information 1:1 In Past 12 Months?: No CIRT Risk: No Elopement Risk: No Does patient have medical clearance?: Yes     Disposition:  Disposition Initial Assessment Completed for this Encounter: Yes Disposition of Patient: Inpatient treatment program;Referred to Type of inpatient treatment program: Adult Patient referred to: [facility]  On Site Evaluation by:   Reviewed with Physician:  Beatriz Stallion Ray 08/07/2012 6:49 AM

## 2012-08-07 NOTE — ED Notes (Signed)
Updated Pt. Status to Dr. Anitra Lauth, Pt. Placed on 2L [place] while sleeping, sats decreased to 85%.     Presently he is at 90% osygen sats

## 2012-08-07 NOTE — ED Notes (Signed)
Lab in room to obtain blood work

## 2012-08-07 NOTE — ED Notes (Signed)
Breakfast tray delivered to room.  

## 2012-08-07 NOTE — ED Provider Notes (Signed)
History     CSN: [id number]  Arrival date & time 08/06/12  2323   First MD Initiated Contact with Patient 08/06/12 2337      Chief Complaint  Patient presents with  . Addiction Problem    (Consider location/radiation/quality/duration/timing/severity/associated sxs/prior treatment) HPI Brian [name] is a 57 y.o. homeless male presents to ER requesting help for Detox from ETOH/THC/&Cocaine. He reports drinking 5-6 40s daily. He denies having any visual or auditory hallucinations, a history of withdrawal seizures, SI/HI. In addition pt repots that he has intermittent chest pain, but is un able to describe it stating "Im always drunk or high on drugs so I cant really explain it." Pt denies any current chest pain, SOB, leg swelling,, fevers, NS, or chills. Lastly he reports having a chronic cough, but denies change in sputum production.   Past Medical History  Diagnosis Date  . Heart failure   . Arthritis   . Alcohol abuse     Heavy alcohol abuse and homelessness  . Tobacco abuse   . Cocaine abuse     And crack   . Homelessness   . COPD (chronic obstructive pulmonary disease)   . Suicide attempt   . CHF (congestive heart failure)     Past Surgical History  Procedure Laterality Date  . Inner ear surgery      Family History  Problem Relation Age of Onset  . Coronary artery disease    . Diabetes      History  Substance Use Topics  . Smoking status: Current Every Day Smoker -- 2.00 packs/day    Types: Cigarettes  . Smokeless tobacco: Never Used  . Alcohol Use: Yes     Comment: Heavy. 5-10 40oz daily      Review of Systems  All other systems reviewed and are negative.    Allergies  Review of patient's allergies indicates no known allergies.  Home Medications  No current outpatient prescriptions on file.  BP 102/63  Pulse 91  Temp(Src) 98.4 F (36.9 C) (Oral)  Resp 18  SpO2 98%  Physical Exam  Constitutional: He is oriented to person, place, and time.  He appears well-developed and well-nourished. No distress.  HENT:  Head: Normocephalic and atraumatic.  Mouth/Throat: Oropharynx is clear and moist. No oropharyngeal exudate.  Eyes: Conjunctivae and EOM are normal. Pupils are equal, round, and reactive to light. No scleral icterus.  Neck: Normal range of motion. Neck supple. No tracheal deviation present. No thyromegaly present.  Cardiovascular: Normal rate, regular rhythm, normal heart sounds and intact distal pulses.   Pulmonary/Chest: Effort normal and breath sounds normal. No stridor. No respiratory distress. He has no wheezes.  Crackles in RLB  Abdominal: Soft.  Musculoskeletal: Normal range of motion. He exhibits no edema and no tenderness.  Neurological: He is alert and oriented to person, place, and time. Coordination normal.  Skin: Skin is warm and dry. No rash noted. He is not diaphoretic. No erythema. No pallor.  Psychiatric: He has a normal mood and affect. His behavior is normal.    ED Course  Procedures (including critical care time)  Labs Reviewed  ETHANOL  URINE RAPID DRUG SCREEN (HOSP PERFORMED)  CBC WITH DIFFERENTIAL  COMPREHENSIVE METABOLIC PANEL   Dg Chest 2 View  08/07/2012  *RADIOLOGY REPORT*  Clinical Data: Shortness of breath.  Chest pain.  CHEST - 2 VIEW  Comparison: 06/03/2012  Findings: Hyperinflation suggesting emphysematous change. Atelectasis or fibrosis in the lung bases.  Slight improvement since previous study. The  heart size and pulmonary vascularity are normal. The lungs appear clear and expanded without focal air space disease or consolidation. No blunting of the costophrenic angles. No pneumothorax.  Mediastinal contours appear intact. Calcification of the aorta.  Degenerative changes in the spine.  IMPRESSION: Emphysematous changes in the lungs.  Linear fibrosis or atelectasis in the lung bases.  No active consolidation.   Original Report Authenticated By: Burman Nieves, M.D.     Date: 08/07/2012   Rate: 97  Rhythm: normal sinus rhythm  QRS Axis: normal  Intervals: normal  ST/T Wave abnormalities: normal  Conduction Disutrbances: none  Narrative Interpretation:   Old EKG Reviewed: No significant changes noted   No diagnosis found.    MDM  Medical clearance for detox Patient has been medically cleared in the ED and is awaiting consult by ACT team for possible placement vs OP clinic information. Pt is currently not having SI or HI and appears stable in NAD. Pt is cleared to be moved back to [facility].          Jaci Carrel, [place] 08/07/12 [phone]

## 2012-08-07 NOTE — ED Notes (Signed)
Pt. Having a headache, medicated with Ibuprofen 600 mg

## 2012-08-08 MED ORDER — ETOMIDATE 2 MG/ML IV SOLN
INTRAVENOUS | Status: AC
  Filled 2012-08-08: qty 20

## 2012-08-08 MED ORDER — SUCCINYLCHOLINE CHLORIDE 20 MG/ML IJ SOLN
INTRAMUSCULAR | Status: AC
  Filled 2012-08-08: qty 1

## 2012-08-08 MED ORDER — LIDOCAINE HCL (CARDIAC) 20 MG/ML IV SOLN
INTRAVENOUS | Status: AC
  Filled 2012-08-08: qty 5

## 2012-08-08 MED ORDER — ROCURONIUM BROMIDE 50 MG/5ML IV SOLN
INTRAVENOUS | Status: AC
  Filled 2012-08-08: qty 2

## 2012-08-08 NOTE — ED Notes (Signed)
Pt. Up to restroom. 
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
## 2012-08-08 NOTE — Progress Notes (Signed)
CSW received referral to assist with transportation.  Pt provided with bus pass and train ticket.   No further CSW needs identified.  Leron Croak, LCSWA [employer] [phone]

## 2012-08-08 NOTE — BH Assessment (Signed)
Assessment Note  Update:  Called [employer], left message @ [phone] for authorization, as pt accepted at [facility] per Okey Regal (per last clinician on call) pending an authorization.  Received call back from [place] at [place] @ 0915 and authorization TAR [id number] for 08/08/12-08/11/12 after this Clinical research associate gave clinical.  Called [facility] and informed Okey Regal at 1006.  P Mariana Single, RN, CM and Leron Croak, CSW got bus and train ticket for pt to be transported to [facility].  Updated EDP Hyacinth Meeker and ED staff.  Pt to be discharged to [facility].    Disposition:  Disposition Initial Assessment Completed for this Encounter: Yes Disposition of Patient: Inpatient treatment program Type of inpatient treatment program: Adult Patient referred to: [facility]  On Site Evaluation by:   Reviewed with Physician:  Rocky Morel, Rennis Harding 08/08/2012 11:48 AM

## 2012-08-08 NOTE — BH Assessment (Addendum)
[facility] Assessment Progress Note      Shanda Bumps at [facility] reports that they filled their available beds with patients from the ED, but states that the incoming clinician should call back after noon to inquire about bed availability. She will write Mr Prosch name down to be sure their incoming person reviews his referral.  Cory Munch at [facility] reports they still have a bed, but have no evidence of his referral of his acceptance by Okey Regal.  He will follow up with Baxter Hire on day shift.

## 2012-08-08 NOTE — ED Notes (Signed)
Pt. Offered inhaler. Denies wanting to use at this time.

## 2012-08-08 NOTE — ED Provider Notes (Signed)
Pt accepted by [facility], d/c with dx of ETOH abuse  Vida Roller, MD 08/08/12 1146

## 2012-08-08 NOTE — ED Notes (Signed)
Gayla Doss -- (friend) [phone]  Will come to take home if no ride.

## 2012-08-16 NOTE — ED Provider Notes (Signed)
Medical screening examination/treatment/procedure(s) were performed by non-physician practitioner and as supervising physician I was immediately available for consultation/collaboration.  Julie Manly, MD 08/16/12 0512 

## 2012-08-27 ENCOUNTER — Inpatient Hospital Stay (HOSPITAL_COMMUNITY)
Admission: EM | Admit: 2012-08-27 | Discharge: 2012-08-28 | DRG: 192 | Disposition: A | Discharge status: Home / Self Care | Payer: MEDICAID | Attending: Internal Medicine | Admitting: Internal Medicine

## 2012-08-27 ENCOUNTER — Emergency Department (HOSPITAL_COMMUNITY): Payer: Self-pay

## 2012-08-27 DIAGNOSIS — J449 Chronic obstructive pulmonary disease, unspecified: Secondary | ICD-10-CM

## 2012-08-27 DIAGNOSIS — R002 Palpitations: Secondary | ICD-10-CM | POA: Diagnosis present

## 2012-08-27 DIAGNOSIS — R079 Chest pain, unspecified: Secondary | ICD-10-CM

## 2012-08-27 DIAGNOSIS — J441 Chronic obstructive pulmonary disease with (acute) exacerbation: Secondary | ICD-10-CM | POA: Diagnosis present

## 2012-08-27 DIAGNOSIS — Z5900 Homelessness unspecified: Secondary | ICD-10-CM

## 2012-08-27 DIAGNOSIS — R0602 Shortness of breath: Secondary | ICD-10-CM | POA: Diagnosis present

## 2012-08-27 DIAGNOSIS — D751 Secondary polycythemia: Secondary | ICD-10-CM | POA: Diagnosis present

## 2012-08-27 DIAGNOSIS — F1410 Cocaine abuse, uncomplicated: Secondary | ICD-10-CM | POA: Diagnosis present

## 2012-08-27 DIAGNOSIS — F1010 Alcohol abuse, uncomplicated: Secondary | ICD-10-CM | POA: Diagnosis present

## 2012-08-27 DIAGNOSIS — F1910 Other psychoactive substance abuse, uncomplicated: Secondary | ICD-10-CM | POA: Diagnosis present

## 2012-08-27 DIAGNOSIS — R0789 Other chest pain: Secondary | ICD-10-CM | POA: Diagnosis present

## 2012-08-27 DIAGNOSIS — F17200 Nicotine dependence, unspecified, uncomplicated: Secondary | ICD-10-CM | POA: Diagnosis present

## 2012-08-27 DIAGNOSIS — D696 Thrombocytopenia, unspecified: Secondary | ICD-10-CM | POA: Diagnosis present

## 2012-08-27 DIAGNOSIS — I509 Heart failure, unspecified: Secondary | ICD-10-CM | POA: Diagnosis present

## 2012-08-27 LAB — URINALYSIS, ROUTINE W REFLEX MICROSCOPIC
Bilirubin Urine: NEGATIVE
Ketones, ur: NEGATIVE mg/dL
Leukocytes, UA: NEGATIVE
Nitrite: NEGATIVE
Protein, ur: NEGATIVE mg/dL
Urobilinogen, UA: 0.2 mg/dL (ref 0.0–1.0)

## 2012-08-27 LAB — HEMOGLOBIN A1C: Mean Plasma Glucose: 100 mg/dL (ref <117)

## 2012-08-27 LAB — BASIC METABOLIC PANEL
BUN: 6 mg/dL (ref 6–23)
Chloride: 99 mEq/L (ref 96–112)
Creatinine, Ser: 0.8 mg/dL (ref 0.50–1.35)
GFR calc Af Amer: >90 mL/min (ref >90)
GFR calc non Af Amer: >90 mL/min (ref >90)

## 2012-08-27 LAB — CBC WITH DIFFERENTIAL/PLATELET
Basophils Absolute: 0 10*3/uL (ref 0.0–0.1)
Basophils Relative: 0 % (ref 0–1)
Eosinophils Absolute: 0.1 10*3/uL (ref 0.0–0.7)
HCT: 49.7 % (ref 39.0–52.0)
Hemoglobin: 17.4 g/dL — ABNORMAL HIGH (ref 13.0–17.0)
MCH: 33 pg (ref 26.0–34.0)
MCHC: 35 g/dL (ref 30.0–36.0)
Monocytes Absolute: 0.7 10*3/uL (ref 0.1–1.0)
Monocytes Relative: 14 % — ABNORMAL HIGH (ref 3–12)
Neutro Abs: 2.4 10*3/uL (ref 1.7–7.7)
Neutrophils Relative %: 50 % (ref 43–77)
RDW: 14.4 % (ref 11.5–15.5)

## 2012-08-27 LAB — ETHANOL: Alcohol, Ethyl (B): 120 mg/dL — ABNORMAL HIGH (ref 0–11)

## 2012-08-27 LAB — TROPONIN I
Troponin I: <0.3 ng/mL (ref <0.30)
Troponin I: <0.3 ng/mL (ref <0.30)
Troponin I: <0.3 ng/mL (ref <0.30)

## 2012-08-27 LAB — PRO B NATRIURETIC PEPTIDE: Pro B Natriuretic peptide (BNP): 101.8 pg/mL (ref 0–125)

## 2012-08-27 LAB — MRSA PCR SCREENING: MRSA by PCR: NEGATIVE

## 2012-08-27 LAB — LIPASE, BLOOD: Lipase: 51 U/L (ref 11–59)

## 2012-08-27 MED ORDER — ENOXAPARIN SODIUM 40 MG/0.4ML ~~LOC~~ SOLN
40.0000 mg | SUBCUTANEOUS | Status: DC
  Administered 2012-08-27: 40 mg via SUBCUTANEOUS
  Filled 2012-08-27 (×2): qty 0.4

## 2012-08-27 MED ORDER — VITAMIN B-1 100 MG PO TABS
100.0000 mg | ORAL_TABLET | Freq: Every day | ORAL | Status: DC
  Administered 2012-08-27 – 2012-08-28 (×2): 100 mg via ORAL
  Filled 2012-08-27 (×2): qty 1

## 2012-08-27 MED ORDER — ALUM & MAG HYDROXIDE-SIMETH 200-200-20 MG/5ML PO SUSP: 30.0000 mL | Freq: Four times a day (QID) | ORAL | Status: DC | PRN

## 2012-08-27 MED ORDER — LORAZEPAM 1 MG PO TABS
1.0000 mg | ORAL_TABLET | Freq: Four times a day (QID) | ORAL | Status: DC | PRN
  Administered 2012-08-27 – 2012-08-28 (×2): 1 mg via ORAL
  Filled 2012-08-27 (×2): qty 1

## 2012-08-27 MED ORDER — IPRATROPIUM BROMIDE 0.02 % IN SOLN
0.5000 mg | Freq: Three times a day (TID) | RESPIRATORY_TRACT | Status: DC
  Administered 2012-08-28: 0.5 mg via RESPIRATORY_TRACT

## 2012-08-27 MED ORDER — ASPIRIN 81 MG PO CHEW: 324.0000 mg | CHEWABLE_TABLET | Freq: Once | ORAL | Status: DC

## 2012-08-27 MED ORDER — THIAMINE HCL 100 MG/ML IJ SOLN
100.0000 mg | Freq: Every day | INTRAMUSCULAR | Status: DC
  Filled 2012-08-27 (×2): qty 1

## 2012-08-27 MED ORDER — IPRATROPIUM BROMIDE 0.02 % IN SOLN
0.5000 mg | RESPIRATORY_TRACT | Status: DC
  Administered 2012-08-27 (×3): 0.5 mg via RESPIRATORY_TRACT
  Filled 2012-08-27 (×3): qty 2.5

## 2012-08-27 MED ORDER — FOLIC ACID 1 MG PO TABS
1.0000 mg | ORAL_TABLET | Freq: Every day | ORAL | Status: DC
  Administered 2012-08-27 – 2012-08-28 (×2): 1 mg via ORAL
  Filled 2012-08-27 (×2): qty 1

## 2012-08-27 MED ORDER — ADULT MULTIVITAMIN W/MINERALS CH
1.0000 | ORAL_TABLET | Freq: Every day | ORAL | Status: DC
  Administered 2012-08-27 – 2012-08-28 (×2): 1 via ORAL
  Filled 2012-08-27 (×2): qty 1

## 2012-08-27 MED ORDER — SODIUM CHLORIDE 0.9 % IJ SOLN
3.0000 mL | Freq: Two times a day (BID) | INTRAMUSCULAR | Status: DC
  Administered 2012-08-27 – 2012-08-28 (×3): 3 mL via INTRAVENOUS

## 2012-08-27 MED ORDER — SODIUM CHLORIDE 0.9 % IV BOLUS (SEPSIS)
1000.0000 mL | Freq: Once | INTRAVENOUS | Status: AC
  Administered 2012-08-27: 1000 mL via INTRAVENOUS

## 2012-08-27 MED ORDER — ALBUTEROL SULFATE (5 MG/ML) 0.5% IN NEBU
2.5000 mg | INHALATION_SOLUTION | RESPIRATORY_TRACT | Status: DC
  Administered 2012-08-27 (×3): 2.5 mg via RESPIRATORY_TRACT
  Filled 2012-08-27 (×3): qty 0.5

## 2012-08-27 MED ORDER — IPRATROPIUM BROMIDE 0.02 % IN SOLN: 0.5000 mg | RESPIRATORY_TRACT | Status: DC

## 2012-08-27 MED ORDER — ALBUTEROL SULFATE (5 MG/ML) 0.5% IN NEBU: 2.5000 mg | INHALATION_SOLUTION | RESPIRATORY_TRACT | Status: DC | PRN

## 2012-08-27 MED ORDER — PNEUMOCOCCAL VAC POLYVALENT 25 MCG/0.5ML IJ INJ
0.5000 mL | INJECTION | INTRAMUSCULAR | Status: DC
  Filled 2012-08-27: qty 0.5

## 2012-08-27 MED ORDER — ASPIRIN EC 81 MG PO TBEC
81.0000 mg | DELAYED_RELEASE_TABLET | Freq: Every day | ORAL | Status: DC
  Administered 2012-08-27 – 2012-08-28 (×2): 81 mg via ORAL
  Filled 2012-08-27 (×2): qty 1

## 2012-08-27 MED ORDER — ONDANSETRON HCL 4 MG/2ML IJ SOLN: 4.0000 mg | Freq: Four times a day (QID) | INTRAMUSCULAR | Status: DC | PRN

## 2012-08-27 MED ORDER — LORAZEPAM 2 MG/ML IJ SOLN: 1.0000 mg | Freq: Four times a day (QID) | INTRAMUSCULAR | Status: DC | PRN

## 2012-08-27 MED ORDER — IBUPROFEN 800 MG PO TABS
800.0000 mg | ORAL_TABLET | Freq: Once | ORAL | Status: AC
  Administered 2012-08-27: 800 mg via ORAL
  Filled 2012-08-27: qty 1

## 2012-08-27 MED ORDER — ONDANSETRON HCL 4 MG PO TABS: 4.0000 mg | ORAL_TABLET | Freq: Four times a day (QID) | ORAL | Status: DC | PRN

## 2012-08-27 MED ORDER — ALBUTEROL SULFATE (5 MG/ML) 0.5% IN NEBU
2.5000 mg | INHALATION_SOLUTION | Freq: Three times a day (TID) | RESPIRATORY_TRACT | Status: DC
  Administered 2012-08-28: 2.5 mg via RESPIRATORY_TRACT

## 2012-08-27 MED ORDER — PANTOPRAZOLE SODIUM 40 MG PO TBEC
40.0000 mg | DELAYED_RELEASE_TABLET | Freq: Every day | ORAL | Status: DC
  Administered 2012-08-27 – 2012-08-28 (×2): 40 mg via ORAL
  Filled 2012-08-27: qty 1

## 2012-08-27 MED ORDER — ALBUTEROL SULFATE (5 MG/ML) 0.5% IN NEBU: 2.5000 mg | INHALATION_SOLUTION | RESPIRATORY_TRACT | Status: DC

## 2012-08-27 MED ORDER — MORPHINE SULFATE 2 MG/ML IJ SOLN
2.0000 mg | INTRAMUSCULAR | Status: DC | PRN
  Administered 2012-08-27: 2 mg via INTRAVENOUS
  Filled 2012-08-27: qty 1

## 2012-08-27 NOTE — ED Notes (Signed)
Pharmacy in room at this time.

## 2012-08-27 NOTE — ED Notes (Signed)
Patient brought in by EMS, called for chest pain. Patient states pain has been on and off since yesterday. Pain is center of chest going into left arm, along with numbness and tingling in arm. EMS gave 324mg  of ASA and 2 nitro SL. Patient rates pain 8/10, no relief with nitro. Patient did smoke crack cocaine this morning along with drinking ETOH. Patient awake and oriented

## 2012-08-27 NOTE — H&P (Signed)
Internal Medicine Teaching Service Attending Note Date: 08/27/2012  Patient name: Brian [name]  Medical record number: [id number]  Date of birth: 09/10/1955   I have seen and evaluated Brian [name] and discussed their care with the Residency Team. Please see Dr Jory Ee H&P for full details. Brian [name] is a 57 yo male with no CAD or fam h/o CAD. He is homeless and has substance abuse issues. He started developing a productive cough one month ago with assoc increasing DOE. He has to rest when walking one block. He is not dyspneic at rest. He developed one day of CP that is intermittent and L sided. This occurred at rest and assoc with L arm numbness and tingling. He is currently CP free. He smokes 1 PPD, drinks daily, uses THC, and smokes cocaine. He has been in rehab for substance use 2 or 3 times, the most recent a couple of yrs ago. He remained substance free for 4-5 months. He states when he stops drinking he does gets shakes but no DT's. He has had no nose bleeding, blood in urine or stool, or easy busing.   Physical Exam: Blood pressure 116/71, pulse 83, temperature 98.8 F (37.1 C), temperature source Oral, resp. rate 19, SpO2 95.00%. General appearance: alert, cooperative, appears older than stated age and no distress Head: Normocephalic, without obvious abnormality, atraumatic Eyes: positive findings: conjunctiva: B injection Lungs: clear to auscultation bilaterally Heart: regular rate and rhythm, S1, S2 normal, no murmur, click, rub or gallop Abdomen: soft, non-tender; bowel sounds normal; no masses,  no organomegaly Extremities: edema trace on L, nl on R Skin: Skin color, texture, turgor normal. No rashes or lesions or Very tanned on sun exposed areas Neurologic: Grossly normal  Lab results: Results for orders placed during the hospital encounter of 08/27/12 (from the past 24 hour(s))  ETHANOL     Status: Abnormal   Collection Time    08/27/12  6:55 AM      Result Value Range    Alcohol, Ethyl (B) 120 (*) 0 - 11 mg/dL  TROPONIN I     Status: None   Collection Time    08/27/12  6:55 AM      Result Value Range   Troponin I <0.30  <0.30 ng/mL  CBC WITH DIFFERENTIAL     Status: Abnormal   Collection Time    08/27/12  6:55 AM      Result Value Range   WBC 4.9  4.0 - 10.5 K/uL   RBC 5.28  4.22 - 5.81 MIL/uL   Hemoglobin 17.4 (*) 13.0 - 17.0 g/dL   HCT 21.3  08.6 - 57.8 %   MCV 94.1  78.0 - 100.0 fL   MCH 33.0  26.0 - 34.0 pg   MCHC 35.0  30.0 - 36.0 g/dL   RDW 46.9  62.9 - 52.8 %   Platelets 140 (*) 150 - 400 K/uL   Neutrophils Relative % 50  43 - 77 %   Neutro Abs 2.4  1.7 - 7.7 K/uL   Lymphocytes Relative 34  12 - 46 %   Lymphs Abs 1.7  0.7 - 4.0 K/uL   Monocytes Relative 14 (*) 3 - 12 %   Monocytes Absolute 0.7  0.1 - 1.0 K/uL   Eosinophils Relative 2  0 - 5 %   Eosinophils Absolute 0.1  0.0 - 0.7 K/uL   Basophils Relative 0  0 - 1 %   Basophils Absolute 0.0  0.0 - 0.1 K/uL  BASIC METABOLIC PANEL     Status: Abnormal   Collection Time    08/27/12  6:55 AM      Result Value Range   Sodium 137  135 - 145 mEq/L   Potassium 3.7  3.5 - 5.1 mEq/L   Chloride 99  96 - 112 mEq/L   CO2 20  19 - 32 mEq/L   Glucose, Bld 173 (*) 70 - 99 mg/dL   BUN 6  6 - 23 mg/dL   Creatinine, Ser 2.95  0.50 - 1.35 mg/dL   Calcium 9.1  8.4 - 62.1 mg/dL   GFR calc non Af Amer >90  >90 mL/min   GFR calc Af Amer >90  >90 mL/min  PRO B NATRIURETIC PEPTIDE     Status: None   Collection Time    08/27/12  6:55 AM      Result Value Range   Pro B Natriuretic peptide (BNP) 101.8  0 - 125 pg/mL  LIPASE, BLOOD     Status: None   Collection Time    08/27/12  6:55 AM      Result Value Range   Lipase 51  11 - 59 U/L  URINALYSIS, ROUTINE W REFLEX MICROSCOPIC     Status: None   Collection Time    08/27/12  6:59 AM      Result Value Range   Color, Urine YELLOW  YELLOW   APPearance CLEAR  CLEAR   Specific Gravity, Urine 1.006  1.005 - 1.030   pH 5.5  5.0 - 8.0   Glucose, UA  NEGATIVE  NEGATIVE mg/dL   Hgb urine dipstick NEGATIVE  NEGATIVE   Bilirubin Urine NEGATIVE  NEGATIVE   Ketones, ur NEGATIVE  NEGATIVE mg/dL   Protein, ur NEGATIVE  NEGATIVE mg/dL   Urobilinogen, UA 0.2  0.0 - 1.0 mg/dL   Nitrite NEGATIVE  NEGATIVE   Leukocytes, UA NEGATIVE  NEGATIVE  TROPONIN I     Status: None   Collection Time    08/27/12 12:52 PM      Result Value Range   Troponin I <0.30  <0.30 ng/mL  MRSA PCR SCREENING     Status: None   Collection Time    08/27/12  1:51 PM      Result Value Range   MRSA by PCR NEGATIVE  NEGATIVE    Imaging results:  Dg Chest 2 View  08/27/2012   *RADIOLOGY REPORT*  Clinical Data: Cough and shortness of breath.  CHEST - 2 VIEW  Comparison: Chest x-ray 08/07/2012.  Findings: Lung volumes are within normal limits.  Linear opacities at the left base are compatible with subsegmental atelectasis.  No acute consolidative airspace disease.  No pleural effusions. Pulmonary vasculature and the cardiomediastinal silhouette are within normal limits.  Atherosclerosis of the thoracic aorta.  IMPRESSION: 1.  Minimal subsegmental atelectasis at the left base. 2.  Atherosclerosis.   Original Report Authenticated By: Trudie Reed, M.D.    Assessment and Plan: I agree with the formulated Assessment and Plan with the following changes:   1. Unstable angina - the patient will be ruled out for ACS with EKG's and Trop I. Other causes of his chest pain could be pul related such as CAP or COPD exac but exam and CXR are clear. GI causes such as gastritis from ETOH use are also likely. Other causes like PE, eso rupture, or aortic dissection a re unlikely. The most likely cause is cocaine induced vasospasm. If the CE and EKG remain nl,  then the pt may be safely D/C'd and stress that he needs to remain free from cocaine.   Cocaine can accelerate CAD and he does smoke. Cath had been recommended last admission but pt left AMA. Unless his CE and EKG become +, he will not  need cards consult.  2. Cough - CXR and exam are nl. Will check serial exams. There does not appear to be pul edema as cause. Could be sequela of tobacco use. Would need PFT's as outpt if pt is able to F/U. If he cont to be symptomatic, we could presume that he has COPD and his current sxs would dx a COPD exac and ABX would be indicated. There are many other potential causes of cough and outpt is the best place to address W/U and tx.   3. Homelessness - When asked where he wanted to go once ready for D/C he said back to the streets. He said he would think about speaking to social work about alternative plans.  4. Thrombocytopenia - No sequela. Could be 2/2 ETOH use. CMP pending.  Likely D/C in AM.  Burns Spain, MD 5/20/20144:33 PM

## 2012-08-27 NOTE — ED Notes (Signed)
Admitting MD at bedside.

## 2012-08-27 NOTE — ED Provider Notes (Signed)
History     CSN: [id number]  Arrival date & time 08/27/12  0602   None     Chief Complaint  Patient presents with  . Chest Pain    (Consider location/radiation/quality/duration/timing/severity/associated sxs/prior treatment) HPI Comments: 57 y.o. Male with PMHx of [place], polysubstance abuse, COPD, CHF, and homelessness presents today complaining of acute onset sharp, severe chest pain after ingesting crack cocaine that comes and goes since yesterday. Hurts more when he coughs and takes a deep breath and is tender to palpation. Radiates down left arm and complaining of numbness to left hand. Admits headache, worsening cough, malaise, dyspnea for about a week. This pain is similar to other episodes of chest pain for which the pt has presented before. Pt also admits to drinking 80 ounces of alcohol yesterday. Admits to daily ETOH consumption.   Admitted January 2013 for sustained V tach. Pt was scheduled for a cardiac cath during this hospital stay but refused.     Patient is a 58 y.o. male presenting with chest pain.  Chest Pain Associated symptoms: cough and shortness of breath   Associated symptoms: no diaphoresis, no dizziness, no fever, no headache, no nausea, no numbness, no palpitations, not vomiting and no weakness     Past Medical History  Diagnosis Date  . Heart failure   . Arthritis   . Alcohol abuse     Heavy alcohol abuse and homelessness  . Tobacco abuse   . Cocaine abuse     And crack   . Homelessness   . COPD (chronic obstructive pulmonary disease)   . Suicide attempt   . CHF (congestive heart failure)     Past Surgical History  Procedure Laterality Date  . Inner ear surgery      Family History  Problem Relation Age of Onset  . Coronary artery disease    . Diabetes      History  Substance Use Topics  . Smoking status: Current Every Day Smoker -- 2.00 packs/day    Types: Cigarettes  . Smokeless tobacco: Never Used  . Alcohol Use: Yes     Comment:  Heavy. 5-10 40oz daily      Review of Systems  Constitutional: Positive for chills. Negative for fever and diaphoresis.  HENT: Positive for sore throat. Negative for neck pain and neck stiffness.   Eyes: Negative for visual disturbance.  Respiratory: Positive for cough and shortness of breath. Negative for apnea and chest tightness.        Wet cough  Cardiovascular: Positive for chest pain. Negative for palpitations.       Sharp, left sided, tender to palpation.   Gastrointestinal: Negative for nausea, vomiting, diarrhea and constipation.  Genitourinary: Negative for dysuria.  Musculoskeletal: Negative for gait problem.  Skin: Negative for rash.  Neurological: Negative for dizziness, weakness, light-headedness, numbness and headaches.  Psychiatric/Behavioral: Negative for suicidal ideas.    Allergies  Review of patient's allergies indicates no known allergies.  Home Medications  No current outpatient prescriptions on file.  BP 136/71  Pulse 103  Temp(Src) 97.9 F (36.6 C) (Oral)  Resp 23  SpO2 93%  Physical Exam  Nursing note and vitals reviewed. Constitutional: He is oriented to person, place, and time. He appears well-developed and well-nourished. No distress.  HENT:  Head: Normocephalic and atraumatic.  Eyes: EOM are normal. Right conjunctiva is injected. Left conjunctiva is injected.  Neck: Normal range of motion. Neck supple.  No meningeal signs  Cardiovascular: Normal rate, regular rhythm and normal  heart sounds.  Exam reveals no gallop and no friction rub.   No murmur heard. Pulmonary/Chest: Effort normal. No respiratory distress. He exhibits tenderness.  Left chest wall tenderness, scattered rhonchi bilaterally  Abdominal: Soft. Bowel sounds are normal. He exhibits no distension. There is no tenderness. There is no rebound and no guarding.  Musculoskeletal: Normal range of motion. He exhibits no edema and no tenderness.  Normal strength in upper and lower  extremities bilaterally including dorsiflexion and plantar flexion, strong and equal grip strength  Neurological: He is alert and oriented to person, place, and time. No cranial nerve deficit.  Speech is clear and goal oriented, follows commands Sensation normal to light touch Moves extremities without ataxia, coordination intact   Skin: Skin is warm and dry. He is not diaphoretic. No erythema.  Psychiatric: He has a normal mood and affect.    ED Course  Procedures (including critical care time)  Labs Reviewed  ETHANOL - Abnormal; Notable for the following:    Alcohol, Ethyl (B) 120 (*)    All other components within normal limits  CBC WITH DIFFERENTIAL - Abnormal; Notable for the following:    Hemoglobin 17.4 (*)    Platelets 140 (*)    Monocytes Relative 14 (*)    All other components within normal limits  BASIC METABOLIC PANEL - Abnormal; Notable for the following:    Glucose, Bld 173 (*)    All other components within normal limits  TROPONIN I  URINALYSIS, ROUTINE W REFLEX MICROSCOPIC  PRO B NATRIURETIC PEPTIDE  DRUGS OF ABUSE SCREEN W/O ALC, ROUTINE URINE   Dg Chest 2 View  08/27/2012   *RADIOLOGY REPORT*  Clinical Data: Cough and shortness of breath.  CHEST - 2 VIEW  Comparison: Chest x-ray 08/07/2012.  Findings: Lung volumes are within normal limits.  Linear opacities at the left base are compatible with subsegmental atelectasis.  No acute consolidative airspace disease.  No pleural effusions. Pulmonary vasculature and the cardiomediastinal silhouette are within normal limits.  Atherosclerosis of the thoracic aorta.  IMPRESSION: 1.  Minimal subsegmental atelectasis at the left base. 2.  Atherosclerosis.   Original Report Authenticated By: Trudie Reed, M.D.    Date: 08/27/2012  Rate: 97  Rhythm: normal sinus rhythm  QRS Axis: normal  Intervals: normal  ST/T Wave abnormalities: normal  Conduction Disutrbances:none  Narrative Interpretation: normal EKG  Old EKG  Reviewed: unchanged 08/07/2012   Date: 08/27/2012, repeat  Rate: 77  Rhythm: normal sinus rhythm  QRS Axis: normal  Intervals: short R-R  ST/T Wave abnormalities: normal  Conduction Disutrbances:none  Narrative Interpretation: normal EKG  Old EKG Reviewed: unchanged    No diagnosis found.    MDM  Pt is hemodynamically stable, afebrile. Will r/o ACS, but higher suspicion for bronchitis, pneumonia, or exacerbation of COPD.   CXR negative for pneumonia. Labs return with 120 ETOH. Will order CIWA and place on ETOH withdrawls protocol. Pt has no known hx of alcohol withdrawal seizures.   1st troponin neg. EKG and repeat EKG show sinus rhythm.  D/t hx of sustained V tach and admission for same, limited assurance of follow up, and cocaine-induced chest pain, will call Int Med consult for 24-hour rule out.    10:10 AM Consult with Internal Medicine (Dr. Rogelia Boga) appreciated. Discussed that d/t pt hx of sustained v-tach, limited assurance of follow up, and cocaine-induced chest pain. They will see the pt.   On re-eval, pt was resting comfortably.   Glade Nurse, PA-C 08/27/12 1204

## 2012-08-27 NOTE — ED Notes (Signed)
Lab in room at this time. 

## 2012-08-27 NOTE — H&P (Signed)
Hospital Admission Note Date: 08/27/2012  Patient name: Brian [name] Medical record number: [id number] Date of birth: 08-Aug-1955 Age: 57 y.o. Gender: male PCP: [name] Patient  Medical Service: Internal Medicine  Attending physician:  Dr. Rogelia Boga   1st Contact: Sadek     Pager:[phone] 2nd Contact: Dierdre Searles    Pager: [phone] After 5 pm or weekends: 1st Contact:      Pager: [phone] 2nd Contact:      Pager: [phone]  Chief Complaint: chest pain   History of Present Illness: 57 year old homeless gentleman with PMH significant for polysubstance abuse presents to the ED for chest pain x1 day.  Patient reports having off-and-on left sided chest pain for one day that got worse thai AM, with radiation to the left arm associated with some tingling and numbness  that prompted him to come to the ER. He describes his chest pain as sharp discomfort located on the left side/substernal area, worst being 8/10. His chest pain gets worse with coughing. He also reports associated palpitations and shortness of breath(of note he has baseline  SOB and does not seem to be worse than that).  Denies any diaphoresis, nausea or vomiting. Patient states that he has been having increasing cough for last 1 month. He is bringing some whitish phlegm.  He is a current smoker, smoking one pack per day for about 15-20  years. He drinks alcohol, about 6 beers every day and also does crack and cannabis. He states that he did crack cocaine 2 days ago and drank 2 beers last night. He was seen in the ED on 08/07/2012 for detox and the plans to follow as an outpatient but unfortunately he started drinking again and did not followup.  He was admitted in 01/13 with similar symptoms along with nonsustained V. Tach. He was evaluated at by the cardiology with plans for catheterization but patient left AMA.   Meds: None  Allergies: Allergies as of 08/27/2012  . (No Known Allergies)   Past Medical History  Diagnosis Date  . Heart  failure   . Arthritis   . Alcohol abuse     Heavy alcohol abuse and homelessness  . Tobacco abuse   . Cocaine abuse     And crack   . Homelessness   . COPD (chronic obstructive pulmonary disease)   . Suicide attempt   . CHF (congestive heart failure)    Past Surgical History  Procedure Laterality Date  . Inner ear surgery     Family History  Problem Relation Age of Onset  . Coronary artery disease    . Diabetes     History   Social History  . Marital Status: Divorced    Spouse Name: N/A    Number of Children: N/A  . Years of Education: N/A   Occupational History  . Not on file.   Social History Main Topics  . Smoking status: Current Every Day Smoker -- 2.00 packs/day    Types: Cigarettes  . Smokeless tobacco: Never Used  . Alcohol Use: Yes     Comment: Heavy. 5-10 40oz daily  . Drug Use: Yes    Special: Marijuana, Cocaine     Comment: Marijuana and cocaine, crack  . Sexually Active: Not Currently   Other Topics Concern  . Not on file   Social History Narrative   Currently homeless. Has poor social support, no family, just friends and police who check on him. Heavy alcohol noted in the past, currently states he  drinks "1-2 beers a day" and last drink was earlier today. States he may get minor shakes and anxiety but denies frank EtOH withdrawal, seizures, DT's. States last cocaine and MJ abuse was last week. Denies IVDU     Review of Systems: Constitutional: negative for anorexia, chills, fatigue, fevers and + malaise HEENT: negative for ear drainage, epistaxis, hearing loss, hoarseness, nasal congestion, snoring and sore throat Respiratory: negative for , stridor and wheezing, positive for cough, sputum and chest pain  Cardiovascular: negative for leg swelling orthopnea, palpitations and paroxysmal nocturnal dyspnea, + chest pain, + palpations,  Gastrointestinal: negative for abdominal pain, change in bowel habits, diarrhea, reflux symptoms and  vomiting Musculoskeletal:negative for arthralgias, back pain, myalgias and stiff joints Neurological: negative for dizziness, gait problems, memory problems, seizures, tremors and vertigo  Physical Exam: Blood pressure 119/65, pulse 71, temperature 98.4 F (36.9 C), temperature source Oral, resp. rate 26, SpO2 92.00%. BP 119/65  Pulse 71  Temp(Src) 98.4 F (36.9 C) (Oral)  Resp 26  SpO2 92%  General Appearance:    Alert, cooperative, no distress, appears stated age, ruddy complexion  Head:    Normocephalic, without obvious abnormality, atraumatic  Eyes:    PERRL, conjunctiva/corneas clear, EOM's intact, fundi    benign, both eyes       Ears:    Normal TM's and external ear canals, both ears  Nose:   Nares normal, septum midline, mucosa normal, no drainage    or sinus tenderness  Throat:   Lips, mucosa, and tongue normal; teeth and gums normal  Neck:   Supple, symmetrical, trachea midline, no adenopathy;       thyroid:  No enlargement/tenderness/nodules; no carotid   bruit or JVD  Back:     Symmetric, no curvature, ROM normal, no CVA tenderness  Lungs:     Decreased air entry bilaterally, respirations unlabored, no wheezes or stridor.   Chest wall:    tender to palpation in the lower sternal area  Heart:    Regular rate and rhythm, S1 and S2 normal, no murmur, rub   or gallop  Abdomen:     Soft, tender to palpation in the epigastrium , bowel sounds active all four quadrants,    no masses, no organomegaly  Genitalia:    Normal male without lesion, discharge or tenderness  Rectal:    Normal tone, normal prostate, no masses or tenderness;   guaiac negative stool  Extremities:   Extremities normal, atraumatic, no cyanosis or edema  Pulses:   2+ and symmetric all extremities  Skin:   Skin color, texture, turgor normal, no rashes or lesions  Lymph nodes:   Cervical, supraclavicular, and axillary nodes normal  Neurologic:   CNII-XII intact. Normal strength, good grip bilaterally  sensation and reflexes      throughout    Lab results: Basic Metabolic Panel:  Recent Labs  96/04/54 0655  NA 137  K 3.7  CL 99  CO2 20  GLUCOSE 173*  BUN 6  CREATININE 0.80  CALCIUM 9.1   CBC:  Recent Labs  08/27/12 0655  WBC 4.9  NEUTROABS 2.4  HGB 17.4*  HCT 49.7  MCV 94.1  PLT 140*   Cardiac Enzymes:  Recent Labs  08/27/12 0655  TROPONINI <0.30   BNP:  Recent Labs  08/27/12 0655  PROBNP 101.8  Urine Drug Screen: Drugs of Abuse     Component Value Date/Time   LABOPIA NONE DETECTED 08/07/2012 0105   COCAINSCRNUR POSITIVE* 08/07/2012 0105  LABBENZ NONE DETECTED 08/07/2012 0105   AMPHETMU NONE DETECTED 08/07/2012 0105   THCU NONE DETECTED 08/07/2012 0105   LABBARB NONE DETECTED 08/07/2012 0105    Alcohol Level:  Recent Labs  08/27/12 0655  ETH 120*   Urinalysis:  Recent Labs  08/27/12 0659  COLORURINE YELLOW  LABSPEC 1.006  PHURINE 5.5  GLUCOSEU NEGATIVE  HGBUR NEGATIVE  BILIRUBINUR NEGATIVE  KETONESUR NEGATIVE  PROTEINUR NEGATIVE  UROBILINOGEN 0.2  NITRITE NEGATIVE  LEUKOCYTESUR NEGATIVE     Imaging results:  Dg Chest 2 View  08/27/2012   *RADIOLOGY REPORT*  Clinical Data: Cough and shortness of breath.  CHEST - 2 VIEW  Comparison: Chest x-ray 08/07/2012.  Findings: Lung volumes are within normal limits.  Linear opacities at the left base are compatible with subsegmental atelectasis.  No acute consolidative airspace disease.  No pleural effusions. Pulmonary vasculature and the cardiomediastinal silhouette are within normal limits.  Atherosclerosis of the thoracic aorta.  IMPRESSION: 1.  Minimal subsegmental atelectasis at the left base. 2.  Atherosclerosis.   Original Report Authenticated By: Trudie Reed, M.D.    Other results: EKG: normal sinus rhythm, HR- 77 bpm, some PAC+( which are new)  ,  unchanged from previous tracing from 04/14.  Assessment & Plan by Problem: Principal Problem:   Chest pain at rest Active Problems:    Alcohol abuse   COPD (chronic obstructive pulmonary disease)   Cocaine abuse   57 year old gentleman with past medical history significant for polysubstance abuse presents to the ED with chest pain for one day in the setting of recent cocaine use.  # Chest pain : Patient presents with waxing and weaning pain left-sided chest pain for one day with radiation to the left arm and associated shortness of breath. He admits doing cocaine 2 days ago. On exam he was mildly tender to palpation  in the lower sternal area/epigatric area. His chest pain is likely related to cocaine-induced vasospasm with some musculoskeletal and pleuritic component( likely from his chronic bronchitis with his h/o tobacco use). Other possibility includes gastritis given his history of alcohol use. His EKG is normal sinus rhythm and does not show any acute ST/T-wave changes. His point-of-care troponins were negative. - Admit to telemetry - Cycle CE x 3 - Repeat EKG in AM - Risk stratification- HbAIC, FLP - Also check lipase given the epigastric discomfort - Protonix for possible gastritis - Duonebs for possible bronchitis - Morphine for pain  - Would not add steroids at this time. 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 - counsel on cessation.   # DVT: Lovenox  Dispo: Disposition is deferred at this time, awaiting improvement of current medical problems. Anticipated discharge in approximately 1-2 day(s).   The patient does not have a current PCP ([name] Patient), therefore will be requiring OPC follow-up after discharge.   The patient does not have transportation limitations that hinder transportation to clinic appointments.  Signed: Jermeka Schlotterbeck 08/27/2012, 11:48 AM

## 2012-08-27 NOTE — ED Notes (Signed)
C/o left side CP constant since yesterday, worse with coughing, palpation & deep breaths. Also c/o h/a "all over" since yesterday. Also reports he is homeless & is outside all the time

## 2012-08-28 ENCOUNTER — Encounter (HOSPITAL_COMMUNITY): Payer: Self-pay | Admitting: *Deleted

## 2012-08-28 LAB — LIPID PANEL
HDL: 62 mg/dL (ref >39)
LDL Cholesterol: 34 mg/dL (ref 0–99)
VLDL: 11 mg/dL (ref 0–40)

## 2012-08-28 LAB — COMPREHENSIVE METABOLIC PANEL
AST: 35 U/L (ref 0–37)
Albumin: 3.1 g/dL — ABNORMAL LOW (ref 3.5–5.2)
Alkaline Phosphatase: 41 U/L (ref 39–117)
BUN: 8 mg/dL (ref 6–23)
CO2: 31 mEq/L (ref 19–32)
Calcium: 9.3 mg/dL (ref 8.4–10.5)
Chloride: 104 mEq/L (ref 96–112)
Creatinine, Ser: 0.84 mg/dL (ref 0.50–1.35)
GFR calc Af Amer: >90 mL/min (ref >90)
GFR calc non Af Amer: >90 mL/min (ref >90)
Potassium: 4.3 mEq/L (ref 3.5–5.1)
Total Bilirubin: 0.3 mg/dL (ref 0.3–1.2)
Total Protein: 5.9 g/dL — ABNORMAL LOW (ref 6.0–8.3)

## 2012-08-28 LAB — DRUGS OF ABUSE SCREEN W/O ALC, ROUTINE URINE
Barbiturate Quant, Ur: NEGATIVE
Cocaine Metabolites: POSITIVE — AB
Creatinine,U: 27.4 mg/dL
Opiate Screen, Urine: NEGATIVE
Phencyclidine (PCP): NEGATIVE
Propoxyphene: NEGATIVE

## 2012-08-28 LAB — CBC
MCH: 31.5 pg (ref 26.0–34.0)
MCV: 96.5 fL (ref 78.0–100.0)
Platelets: 135 10*3/uL — ABNORMAL LOW (ref 150–400)
RBC: 5.21 MIL/uL (ref 4.22–5.81)
RDW: 14.7 % (ref 11.5–15.5)

## 2012-08-28 MED ORDER — SULFAMETHOXAZOLE-TMP DS 800-160 MG PO TABS: 1.0000 | ORAL_TABLET | Freq: Two times a day (BID) | ORAL | Status: DC

## 2012-08-28 MED ORDER — PANTOPRAZOLE SODIUM 40 MG PO TBEC: 40.0000 mg | DELAYED_RELEASE_TABLET | Freq: Every day | ORAL | Status: DC

## 2012-08-28 MED ORDER — ALBUTEROL SULFATE HFA 108 (90 BASE) MCG/ACT IN AERS: 2.0000 | INHALATION_SPRAY | RESPIRATORY_TRACT | Status: DC | PRN

## 2012-08-28 MED ORDER — PREDNISONE 20 MG PO TABS: 60.0000 mg | ORAL_TABLET | Freq: Every day | ORAL | Status: DC

## 2012-08-28 MED ORDER — SULFAMETHOXAZOLE-TMP DS 800-160 MG PO TABS
1.0000 | ORAL_TABLET | Freq: Two times a day (BID) | ORAL | Status: DC
Start: 2012-08-28 — End: 2012-08-28
  Administered 2012-08-28: 1 via ORAL
  Filled 2012-08-28 (×2): qty 1

## 2012-08-28 MED ORDER — PREDNISONE 20 MG PO TABS
40.0000 mg | ORAL_TABLET | Freq: Every day | ORAL | Status: DC
  Filled 2012-08-28: qty 2

## 2012-08-28 MED ORDER — MOMETASONE FURO-FORMOTEROL FUM 100-5 MCG/ACT IN AERO: 2.0000 | INHALATION_SPRAY | Freq: Two times a day (BID) | RESPIRATORY_TRACT | Status: DC

## 2012-08-28 MED ORDER — MOMETASONE FURO-FORMOTEROL FUM 100-5 MCG/ACT IN AERO
2.0000 | INHALATION_SPRAY | Freq: Two times a day (BID) | RESPIRATORY_TRACT | Status: DC
  Filled 2012-08-28: qty 8.8

## 2012-08-28 MED ORDER — ASPIRIN 81 MG PO TBEC: 81.0000 mg | DELAYED_RELEASE_TABLET | Freq: Every day | ORAL | Status: DC

## 2012-08-28 NOTE — Discharge Summary (Signed)
[facility] [address] Discharge Note  Name: Brian [name] MRN: [id number] DOB: 1955-06-25 57 y.o.  Date of Admission: 08/27/2012  6:02 AM Date of Discharge: 08/28/2012 Attending Physician: Dr. Blanch Media  Discharge Diagnosis: Principal Problem:   Chest pain at rest Active Problems:   Alcohol abuse   COPD (chronic obstructive pulmonary disease)   Cocaine abuse  Discharge Medications:   Medication List    TAKE these medications       aspirin 81 MG EC tablet  Take 1 tablet (81 mg total) by mouth daily.     mometasone-formoterol 100-5 MCG/ACT Aero  Commonly known as:  DULERA  Inhale 2 puffs into the lungs 2 (two) times daily.     pantoprazole 40 MG tablet  Commonly known as:  PROTONIX  Take 1 tablet (40 mg total) by mouth daily at 12 noon.     predniSONE 20 MG tablet  Commonly known as:  DELTASONE  Take 3 tablets (60 mg total) by mouth daily with breakfast. Take 60mg  for 2 days then 40mg  for 2 days then 20mg  until all pills finished     sulfamethoxazole-trimethoprim 800-160 MG per tablet  Commonly known as:  BACTRIM DS  Take 1 tablet by mouth every 12 (twelve) hours.        Disposition and follow-up:   Mr.Jadiel Shiflett was discharged from [facility] in Stable condition.  At the hospital follow up visit please address : 1. COPD management 2. outpt stress testing 3. Regular health maintenance  Follow-up Appointments: Pt didn't want to establish self in an appointment designed clinic but rather a place for acute management and acute presentations such as Urgent care and therefore denied establishing care in the [facility] after discharge for follow up and management of medical problems.    Consultations:    Procedures Performed:  Dg Chest 2 View  08/27/2012   *RADIOLOGY REPORT*  Clinical Data: Cough and shortness of breath.  CHEST - 2 VIEW  Comparison: Chest x-ray 08/07/2012.  Findings: Lung volumes are within normal limits.   Linear opacities at the left base are compatible with subsegmental atelectasis.  No acute consolidative airspace disease.  No pleural effusions. Pulmonary vasculature and the cardiomediastinal silhouette are within normal limits.  Atherosclerosis of the thoracic aorta.  IMPRESSION: 1.  Minimal subsegmental atelectasis at the left base. 2.  Atherosclerosis.   Original Report Authenticated By: Trudie Reed, M.D.   Dg Chest 2 View  08/07/2012   *RADIOLOGY REPORT*  Clinical Data: Shortness of breath.  Chest pain.  CHEST - 2 VIEW  Comparison: 06/03/2012  Findings: Hyperinflation suggesting emphysematous change. Atelectasis or fibrosis in the lung bases.  Slight improvement since previous study. The heart size and pulmonary vascularity are normal. The lungs appear clear and expanded without focal air space disease or consolidation. No blunting of the costophrenic angles. No pneumothorax.  Mediastinal contours appear intact. Calcification of the aorta.  Degenerative changes in the spine.  IMPRESSION: Emphysematous changes in the lungs.  Linear fibrosis or atelectasis in the lung bases.  No active consolidation.   Original Report Authenticated By: Burman Nieves, M.D.   Admission HPI: 57 year old homeless gentleman with PMH significant for polysubstance abuse presents to the ED for chest pain x1 day. Patient reports having off-and-on left sided chest pain for one day that got worse thai AM, with radiation to the left arm associated with some tingling and numbness that prompted him to come to the ER. He describes his chest  pain as sharp discomfort located on the left side/substernal area, worst being 8/10. His chest pain gets worse with coughing. He also reports associated palpitations and shortness of breath(of note he has baseline SOB and does not seem to be worse than that). Denies any diaphoresis, nausea or vomiting. Patient states that he has been having increasing cough for last 1 month. He is bringing some  whitish phlegm. He is a current smoker, smoking one pack per day for about 15-20 years. He drinks alcohol, about 6 beers every day and also does crack and cannabis. He states that he did crack cocaine 2 days ago and drank 2 beers last night. He was seen in the ED on 08/07/2012 for detox and the plans to follow as an outpatient but unfortunately he started drinking again and did not followup. He was admitted in 01/13 with similar symptoms along with nonsustained V. Tach. He was evaluated at by the cardiology with plans for catheterization but patient left AMA.   Hospital Course by problem list: # Atypical Chest pain likely 2/2 COPD exacerbation and cocaine: Pt UDS and self disclosure of cocaine use 2 days prior to admission when pain started and then developed some numbness in left arm. ACS ruled out with trops neg x3, His EKGs showed normal sinus rhythm and no any acute ST/T-wave changes. Pain was reproducible on exam and pleuritic in nature. Pt long smoking hx and increase in cough and SOB from baseline therefore meeting criteria for COPD exacerbation. Pt improved with nebulizer treatments while inpt. Upon d/c pt was given albuterol and advair inhalers,prednisone 60 mg with taper and Bactrim DS.   # Polysubstance abuse: Counseled him on cessation.Alcohol level- 120 on admission. Pt was managed with CIWA protocol and ativan. CSW was consulted and pt was not interested in pursing rehab.    Discharge Vitals:  BP 140/64  Pulse 81  Temp(Src) 99.2 F (37.3 C) (Oral)  Resp 17  Ht 5\' 7"  (1.702 m)  SpO2 95% General: resting in bed  HEENT: PERRL, EOMI, no scleral icterus  Cardiac: RRR, no rubs, murmurs or gallops  Pulm: clear to auscultation bilaterally, moving normal volumes of air  Abd: soft, nontender, nondistended, BS present  Ext: warm and well perfused, no pedal edema  Neuro: alert and oriented X3, cranial nerves II-XII grossly intact  Discharge Labs:  No results found for this or any previous  visit (from the past 24 hour(s)).  Signed: Christen Bame 08/31/2012, 10:19 AM   Time Spent on Discharge: Services Ordered on Discharge: none Equipment Ordered on Discharge: none

## 2012-08-28 NOTE — Progress Notes (Signed)
Subjective: Pt no acute events overnight. Pt states total resolution of CP.   Objective: Vital signs in last 24 hours: Filed Vitals:   08/27/12 2111 08/28/12 0200 08/28/12 0536 08/28/12 0900  BP: 136/65 142/66 145/87   Pulse: 89 87 70   Temp: 98.5 F (36.9 C)  98.3 F (36.8 C)   TempSrc: Oral  Oral   Resp: 18  20   SpO2: 96%  98% 98%   Weight change:   Intake/Output Summary (Last 24 hours) at 08/28/12 1331 Last data filed at 08/28/12 0800  Gross per 24 hour  Intake    360 ml  Output   1050 ml  Net   -690 ml   General: resting in bed HEENT: PERRL, EOMI, no scleral icterus Cardiac: RRR, no rubs, murmurs or gallops Pulm: clear to auscultation bilaterally, moving normal volumes of air Abd: soft, nontender, nondistended, BS present Ext: warm and well perfused, no pedal edema Neuro: alert and oriented X3, cranial nerves II-XII grossly intact  Lab Results: Basic Metabolic Panel:  Recent Labs Lab 08/27/12 0655 08/28/12 0511  NA 137 141  K 3.7 4.3  CL 99 104  CO2 20 31  GLUCOSE 173* 134*  BUN 6 8  CREATININE 0.80 0.84  CALCIUM 9.1 9.3   Liver Function Tests:  Recent Labs Lab 08/28/12 0511  AST 35  ALT 40  ALKPHOS 41  BILITOT 0.3  PROT 5.9*  ALBUMIN 3.1*    Recent Labs Lab 08/27/12 0655  LIPASE 51   CBC:  Recent Labs Lab 08/27/12 0655 08/28/12 0511  WBC 4.9 5.0  NEUTROABS 2.4  --   HGB 17.4* 16.4  HCT 49.7 50.3  MCV 94.1 96.5  PLT 140* 135*   Cardiac Enzymes:  Recent Labs Lab 08/27/12 1252 08/27/12 1838 08/27/12 2328  TROPONINI <0.30 <0.30 <0.30   BNP:  Recent Labs Lab 08/27/12 0655  PROBNP 101.8   Hemoglobin A1C:  Recent Labs Lab 08/27/12 1431  HGBA1C 5.1   Fasting Lipid Panel:  Recent Labs Lab 08/28/12 0511  CHOL 107  HDL 62  LDLCALC 34  TRIG 55  CHOLHDL 1.7   Urine Drug Screen: Drugs of Abuse     Component Value Date/Time   LABOPIA NEGATIVE 08/27/2012 0659   LABOPIA NONE DETECTED 08/07/2012 0105    COCAINSCRNUR POSITIVE* 08/27/2012 0659   COCAINSCRNUR POSITIVE* 08/07/2012 0105   LABBENZ NEGATIVE 08/27/2012 0659   LABBENZ NONE DETECTED 08/07/2012 0105   AMPHETMU NEGATIVE 08/27/2012 0659   AMPHETMU NONE DETECTED 08/07/2012 0105   THCU NONE DETECTED 08/07/2012 0105   LABBARB NONE DETECTED 08/07/2012 0105    Alcohol Level:  Recent Labs Lab 08/27/12 0655  ETH 120*   Urinalysis:  Recent Labs Lab 08/27/12 0659  COLORURINE YELLOW  LABSPEC 1.006  PHURINE 5.5  GLUCOSEU NEGATIVE  HGBUR NEGATIVE  BILIRUBINUR NEGATIVE  KETONESUR NEGATIVE  PROTEINUR NEGATIVE  UROBILINOGEN 0.2  NITRITE NEGATIVE  LEUKOCYTESUR NEGATIVE   Micro Results: Recent Results (from the past 240 hour(s))  MRSA PCR SCREENING     Status: None   Collection Time    08/27/12  1:51 PM      Result Value Range Status   MRSA by PCR NEGATIVE  NEGATIVE Final   Comment:            The GeneXpert MRSA Assay (FDA     approved for NASAL specimens     only), is one component of a     comprehensive MRSA colonization  surveillance program. It is not     intended to diagnose MRSA     infection nor to guide or     monitor treatment for     MRSA infections.   Studies/Results: Dg Chest 2 View  08/27/2012   *RADIOLOGY REPORT*  Clinical Data: Cough and shortness of breath.  CHEST - 2 VIEW  Comparison: Chest x-ray 08/07/2012.  Findings: Lung volumes are within normal limits.  Linear opacities at the left base are compatible with subsegmental atelectasis.  No acute consolidative airspace disease.  No pleural effusions. Pulmonary vasculature and the cardiomediastinal silhouette are within normal limits.  Atherosclerosis of the thoracic aorta.  IMPRESSION: 1.  Minimal subsegmental atelectasis at the left base. 2.  Atherosclerosis.   Original Report Authenticated By: Trudie Reed, M.D.   Medications: I have reviewed the patient's current medications. Scheduled Meds: . albuterol  2.5 mg Nebulization TID  . aspirin EC  81 mg Oral  Daily  . enoxaparin (LOVENOX) injection  40 mg Subcutaneous Q24H  . folic acid  1 mg Oral Daily  . ipratropium  0.5 mg Nebulization TID  . multivitamin with minerals  1 tablet Oral Daily  . pantoprazole  40 mg Oral Q1200  . pneumococcal 23 valent vaccine  0.5 mL Intramuscular Tomorrow-1000  . sodium chloride  3 mL Intravenous Q12H  . thiamine  100 mg Oral Daily   Continuous Infusions:  PRN Meds:.albuterol, alum & mag hydroxide-simeth, LORazepam, LORazepam, morphine injection, ondansetron (ZOFRAN) IV, ondansetron Assessment/Plan: # Atypical Chest pain likely 2/2 COPD exacerbation and cocaine: Pt UDS and self discloses cocaine use 2 days prior to admission when pain started and then developed some numbness in left arm. ACS ruled out with trops neg x3, His EKG is normal sinus rhythm and does not show any acute ST/T-wave changes.Pain was reproducible on exam and pleuritic in nature. Pt long smoking hx and increase in cough and SOB from baseline therefore meeting criteria for COPD exacerbation.  -albuterol inhaler -Advair -prednisone 40 mg with taper -Bactrim DS  # Polysubstance abuse: Counseled him on cessation.Alcohol level- 120  - SW consult  - CIWA protocol  Dispo: Disposition is deferred at this time, awaiting improvement of current medical problems.  Anticipated discharge in approximately 1 day(s). Pt doesn't want to establish himself in an appointment based facility therefore recommendations for the [facility] urgent care center was made for some continuity of care and f/u in regards to COPD exacerbation.   The patient does not have a current PCP ([name] Patient), therefore will not be requiring OPC follow-up after discharge.   The patient does have transportation limitations that hinder transportation to clinic appointments.  .Services Needed at time of discharge: Y = Yes, Blank = No PT:   OT:   RN:   Equipment:   Other:     LOS: 1 day   Christen Bame 08/28/2012, 1:31  PM [phone]

## 2012-08-28 NOTE — Care Management Note (Addendum)
  Page 1 of 1   08/28/2012     11:33:07 AM   CARE MANAGEMENT NOTE 08/28/2012  Patient:  Brian [name], Brian [name]   Account Number:  [id number]  Date Initiated:  08/28/2012  Documentation initiated by:  GRAVES-BIGELOW,Jaretssi Kraker  Subjective/Objective Assessment:   Pt admitted with cp. Placed on CIWA Protocol. CM did speak to MD and she has concerns with pt having COPD. Pt does not have a PCP and CM will provide pt with a list for PCP. Pt aware to choose the [facility] and [facility] off of [address]. Cost will be 20.00 for co pay.      Action/Plan:   CM did discuss with MD that MATCH will not be utilized on this pt. MD will write for prednisone po taper dose and then Inhaler for pt while here and he can take home. MD will write for cheap ABX.  No further needs from CM at this time.   Anticipated DC Date:  08/28/2012   Anticipated DC Plan:  HOME/SELF CARE      DC Planning Services  CM consult      Choice offered to / List presented to:             Status of service:  Completed, signed off Medicare Important Message given?   (If response is "NO", the following Medicare IM given date fields will be blank) Date Medicare IM given:   Date Additional Medicare IM given:    Discharge Disposition:  HOME/SELF CARE  Per UR Regulation:  Reviewed for med. necessity/level of care/duration of stay  If discussed at Long Length of Stay Meetings, dates discussed:    Comments:

## 2012-08-28 NOTE — Progress Notes (Signed)
D/c orders received;IV removed with gauze on, pt remains in stable condition, pt meds and instructions reviewed and given to pt; pt d/c to home 

## 2012-08-28 NOTE — Progress Notes (Signed)
CSW met with pt and gave the pt community resources for shelters and community resources in [place] and a bus pass. Pt stated [place] and doesn't really need the list of shelters.  No other CSW needs.  Sherald Barge, LCSW-A Clinical Social Worker [phone]

## 2012-08-29 NOTE — ED Provider Notes (Signed)
Medical screening examination/treatment/procedure(s) were conducted as a shared visit with non-physician practitioner(s) and myself.  I personally evaluated the patient during the encounter   .Face to face Exam:  General:  A&Ox3 HEENT:  Atraumatic Resp:  Normal effort Abd:  Nondistended Neuro:No focal deficits    Nelia Shi, MD 08/29/12 1352

## 2012-09-03 LAB — COCAINE, URINE, CONFIRMATION: Benzoylecgonine GC/MS Conf: >2500 ng/mL — ABNORMAL HIGH

## 2012-10-10 ENCOUNTER — Emergency Department: Payer: Self-pay | Admitting: Emergency Medicine

## 2012-10-10 LAB — CBC
MCH: 32.8 pg (ref 26.0–34.0)
MCV: 96 fL (ref 80–100)
Platelet: 149 10*3/uL — ABNORMAL LOW (ref 150–440)
RDW: 14.6 % — ABNORMAL HIGH (ref 11.5–14.5)
WBC: 8.4 10*3/uL (ref 3.8–10.6)

## 2012-10-10 LAB — COMPREHENSIVE METABOLIC PANEL
Anion Gap: 8 (ref 7–16)
Bilirubin,Total: 0.2 mg/dL (ref 0.2–1.0)
Calcium, Total: 8.5 mg/dL (ref 8.5–10.1)
Chloride: 106 mmol/L (ref 98–107)
Co2: 26 mmol/L (ref 21–32)
Creatinine: 0.96 mg/dL (ref 0.60–1.30)
EGFR (African American): >60
Glucose: 108 mg/dL — ABNORMAL HIGH (ref 65–99)
Osmolality: 279 (ref 275–301)
Potassium: 3.7 mmol/L (ref 3.5–5.1)
SGOT(AST): 32 U/L (ref 15–37)
SGPT (ALT): 44 U/L (ref 12–78)
Sodium: 140 mmol/L (ref 136–145)
Total Protein: 7 g/dL (ref 6.4–8.2)

## 2012-10-10 LAB — URINALYSIS, COMPLETE
Bilirubin,UR: NEGATIVE
Blood: NEGATIVE
Glucose,UR: NEGATIVE mg/dL (ref 0–75)
Hyaline Cast: 9
Ketone: NEGATIVE
Nitrite: NEGATIVE
Ph: 5 (ref 4.5–8.0)
Protein: NEGATIVE
RBC,UR: <1 /HPF (ref 0–5)
Squamous Epithelial: NONE SEEN
WBC UR: <1 /HPF (ref 0–5)

## 2012-10-10 LAB — DRUG SCREEN, URINE
Amphetamines, Ur Screen: NEGATIVE (ref ?–1000)
Barbiturates, Ur Screen: NEGATIVE (ref ?–200)
Benzodiazepine, Ur Scrn: NEGATIVE (ref ?–200)
Cocaine Metabolite,Ur ~~LOC~~: NEGATIVE (ref ?–300)
Opiate, Ur Screen: NEGATIVE (ref ?–300)
Phencyclidine (PCP) Ur S: NEGATIVE (ref ?–25)

## 2012-10-10 LAB — SALICYLATE LEVEL: Salicylates, Serum: 4.5 mg/dL — ABNORMAL HIGH

## 2012-11-10 ENCOUNTER — Emergency Department (HOSPITAL_COMMUNITY): Payer: Self-pay

## 2012-11-10 ENCOUNTER — Inpatient Hospital Stay (HOSPITAL_COMMUNITY)
Admission: EM | Admit: 2012-11-10 | Discharge: 2012-11-12 | DRG: 554 | Disposition: A | Discharge status: Home / Self Care | Payer: MEDICAID | Attending: Internal Medicine | Admitting: Internal Medicine

## 2012-11-10 ENCOUNTER — Encounter (HOSPITAL_COMMUNITY): Payer: Self-pay | Admitting: *Deleted

## 2012-11-10 DIAGNOSIS — Z5900 Homelessness unspecified: Secondary | ICD-10-CM

## 2012-11-10 DIAGNOSIS — L02519 Cutaneous abscess of unspecified hand: Secondary | ICD-10-CM | POA: Diagnosis present

## 2012-11-10 DIAGNOSIS — S62308A Unspecified fracture of other metacarpal bone, initial encounter for closed fracture: Secondary | ICD-10-CM

## 2012-11-10 DIAGNOSIS — F1210 Cannabis abuse, uncomplicated: Secondary | ICD-10-CM | POA: Diagnosis present

## 2012-11-10 DIAGNOSIS — J441 Chronic obstructive pulmonary disease with (acute) exacerbation: Secondary | ICD-10-CM | POA: Diagnosis present

## 2012-11-10 DIAGNOSIS — M109 Gout, unspecified: Principal | ICD-10-CM | POA: Diagnosis present

## 2012-11-10 DIAGNOSIS — Y929 Unspecified place or not applicable: Secondary | ICD-10-CM

## 2012-11-10 DIAGNOSIS — M7989 Other specified soft tissue disorders: Secondary | ICD-10-CM | POA: Diagnosis present

## 2012-11-10 DIAGNOSIS — J449 Chronic obstructive pulmonary disease, unspecified: Secondary | ICD-10-CM | POA: Diagnosis present

## 2012-11-10 DIAGNOSIS — L03113 Cellulitis of right upper limb: Secondary | ICD-10-CM | POA: Diagnosis present

## 2012-11-10 DIAGNOSIS — F17200 Nicotine dependence, unspecified, uncomplicated: Secondary | ICD-10-CM | POA: Diagnosis present

## 2012-11-10 DIAGNOSIS — Z7982 Long term (current) use of aspirin: Secondary | ICD-10-CM

## 2012-11-10 DIAGNOSIS — IMO0002 Reserved for concepts with insufficient information to code with codable children: Secondary | ICD-10-CM | POA: Diagnosis present

## 2012-11-10 DIAGNOSIS — F1020 Alcohol dependence, uncomplicated: Secondary | ICD-10-CM | POA: Diagnosis present

## 2012-11-10 DIAGNOSIS — S62309A Unspecified fracture of unspecified metacarpal bone, initial encounter for closed fracture: Secondary | ICD-10-CM | POA: Diagnosis present

## 2012-11-10 DIAGNOSIS — I509 Heart failure, unspecified: Secondary | ICD-10-CM | POA: Diagnosis present

## 2012-11-10 DIAGNOSIS — F1010 Alcohol abuse, uncomplicated: Secondary | ICD-10-CM | POA: Diagnosis present

## 2012-11-10 DIAGNOSIS — W268XXA Contact with other sharp object(s), not elsewhere classified, initial encounter: Secondary | ICD-10-CM | POA: Diagnosis present

## 2012-11-10 DIAGNOSIS — F1410 Cocaine abuse, uncomplicated: Secondary | ICD-10-CM | POA: Diagnosis present

## 2012-11-10 DIAGNOSIS — L03119 Cellulitis of unspecified part of limb: Secondary | ICD-10-CM | POA: Diagnosis present

## 2012-11-10 DIAGNOSIS — J4489 Other specified chronic obstructive pulmonary disease: Secondary | ICD-10-CM | POA: Diagnosis present

## 2012-11-10 LAB — COMPREHENSIVE METABOLIC PANEL
AST: 32 U/L (ref 0–37)
Albumin: 4.2 g/dL (ref 3.5–5.2)
Alkaline Phosphatase: 42 U/L (ref 39–117)
Chloride: 97 mEq/L (ref 96–112)
Creatinine, Ser: 0.89 mg/dL (ref 0.50–1.35)
Potassium: 4 mEq/L (ref 3.5–5.1)
Total Bilirubin: 0.3 mg/dL (ref 0.3–1.2)
Total Protein: 7.8 g/dL (ref 6.0–8.3)

## 2012-11-10 LAB — CBC
MCHC: 36.5 g/dL — ABNORMAL HIGH (ref 30.0–36.0)
Platelets: 185 10*3/uL (ref 150–400)
RDW: 13.1 % (ref 11.5–15.5)
WBC: 8.4 10*3/uL (ref 4.0–10.5)

## 2012-11-10 MED ORDER — PIPERACILLIN-TAZOBACTAM 3.375 G IVPB
3.3750 g | Freq: Three times a day (TID) | INTRAVENOUS | Status: DC
  Administered 2012-11-10 – 2012-11-11 (×3): 3.375 g via INTRAVENOUS
  Filled 2012-11-10 (×5): qty 50

## 2012-11-10 MED ORDER — ADULT MULTIVITAMIN W/MINERALS CH
1.0000 | ORAL_TABLET | Freq: Every day | ORAL | Status: DC
  Administered 2012-11-10 – 2012-11-12 (×3): 1 via ORAL
  Filled 2012-11-10 (×3): qty 1

## 2012-11-10 MED ORDER — LORAZEPAM 2 MG/ML IJ SOLN: 1.0000 mg | Freq: Four times a day (QID) | INTRAMUSCULAR | Status: DC | PRN

## 2012-11-10 MED ORDER — LORAZEPAM 2 MG/ML IJ SOLN: 0.0000 mg | Freq: Two times a day (BID) | INTRAMUSCULAR | Status: DC

## 2012-11-10 MED ORDER — KETOROLAC TROMETHAMINE 30 MG/ML IJ SOLN
30.0000 mg | Freq: Once | INTRAMUSCULAR | Status: AC
  Administered 2012-11-10: 30 mg via INTRAVENOUS
  Filled 2012-11-10: qty 1

## 2012-11-10 MED ORDER — ENOXAPARIN SODIUM 40 MG/0.4ML ~~LOC~~ SOLN
40.0000 mg | SUBCUTANEOUS | Status: DC
  Administered 2012-11-10 – 2012-11-12 (×2): 40 mg via SUBCUTANEOUS
  Filled 2012-11-10 (×3): qty 0.4

## 2012-11-10 MED ORDER — BIOTENE DRY MOUTH MT LIQD
15.0000 mL | Freq: Two times a day (BID) | OROMUCOSAL | Status: DC
  Administered 2012-11-10 – 2012-11-12 (×4): 15 mL via OROMUCOSAL

## 2012-11-10 MED ORDER — SODIUM CHLORIDE 0.9 % IV SOLN
INTRAVENOUS | Status: DC
  Administered 2012-11-10: 21:00:00 via INTRAVENOUS

## 2012-11-10 MED ORDER — IPRATROPIUM BROMIDE 0.02 % IN SOLN
0.5000 mg | Freq: Three times a day (TID) | RESPIRATORY_TRACT | Status: DC
  Administered 2012-11-10 – 2012-11-12 (×5): 0.5 mg via RESPIRATORY_TRACT
  Filled 2012-11-10 (×4): qty 2.5

## 2012-11-10 MED ORDER — PANTOPRAZOLE SODIUM 40 MG PO TBEC
40.0000 mg | DELAYED_RELEASE_TABLET | Freq: Every day | ORAL | Status: DC
  Administered 2012-11-11 – 2012-11-12 (×2): 40 mg via ORAL
  Filled 2012-11-10 (×2): qty 1

## 2012-11-10 MED ORDER — VITAMIN B-1 100 MG PO TABS
100.0000 mg | ORAL_TABLET | Freq: Every day | ORAL | Status: DC
  Administered 2012-11-10 – 2012-11-12 (×3): 100 mg via ORAL
  Filled 2012-11-10 (×3): qty 1

## 2012-11-10 MED ORDER — ALBUTEROL SULFATE (5 MG/ML) 0.5% IN NEBU
2.5000 mg | INHALATION_SOLUTION | Freq: Three times a day (TID) | RESPIRATORY_TRACT | Status: DC
  Administered 2012-11-10 – 2012-11-12 (×5): 2.5 mg via RESPIRATORY_TRACT
  Filled 2012-11-10 (×4): qty 0.5

## 2012-11-10 MED ORDER — THIAMINE HCL 100 MG/ML IJ SOLN
100.0000 mg | Freq: Every day | INTRAMUSCULAR | Status: DC
  Filled 2012-11-10 (×3): qty 1

## 2012-11-10 MED ORDER — ONDANSETRON HCL 4 MG PO TABS: 4.0000 mg | ORAL_TABLET | Freq: Four times a day (QID) | ORAL | Status: DC | PRN

## 2012-11-10 MED ORDER — OXYCODONE HCL 5 MG PO TABS
5.0000 mg | ORAL_TABLET | ORAL | Status: DC | PRN
  Administered 2012-11-10 – 2012-11-12 (×6): 5 mg via ORAL
  Filled 2012-11-10 (×6): qty 1

## 2012-11-10 MED ORDER — ASPIRIN EC 81 MG PO TBEC
81.0000 mg | DELAYED_RELEASE_TABLET | Freq: Every day | ORAL | Status: DC
  Administered 2012-11-10 – 2012-11-12 (×3): 81 mg via ORAL
  Filled 2012-11-10 (×3): qty 1

## 2012-11-10 MED ORDER — MORPHINE SULFATE 2 MG/ML IJ SOLN
2.0000 mg | INTRAMUSCULAR | Status: DC | PRN
  Administered 2012-11-10 – 2012-11-12 (×3): 2 mg via INTRAVENOUS
  Filled 2012-11-10 (×3): qty 1

## 2012-11-10 MED ORDER — LORAZEPAM 1 MG PO TABS: 1.0000 mg | ORAL_TABLET | Freq: Four times a day (QID) | ORAL | Status: DC | PRN

## 2012-11-10 MED ORDER — ACETAMINOPHEN 325 MG PO TABS: 650.0000 mg | ORAL_TABLET | Freq: Four times a day (QID) | ORAL | Status: DC | PRN

## 2012-11-10 MED ORDER — NAPROXEN 500 MG PO TABS
500.0000 mg | ORAL_TABLET | Freq: Two times a day (BID) | ORAL | Status: DC
  Administered 2012-11-11 – 2012-11-12 (×3): 500 mg via ORAL
  Filled 2012-11-10 (×6): qty 1

## 2012-11-10 MED ORDER — FOLIC ACID 1 MG PO TABS
1.0000 mg | ORAL_TABLET | Freq: Every day | ORAL | Status: DC
  Administered 2012-11-10 – 2012-11-12 (×3): 1 mg via ORAL
  Filled 2012-11-10 (×3): qty 1

## 2012-11-10 MED ORDER — ONDANSETRON HCL 4 MG/2ML IJ SOLN: 4.0000 mg | Freq: Four times a day (QID) | INTRAMUSCULAR | Status: DC | PRN

## 2012-11-10 MED ORDER — ALBUTEROL SULFATE (5 MG/ML) 0.5% IN NEBU: 2.5000 mg | INHALATION_SOLUTION | RESPIRATORY_TRACT | Status: DC | PRN

## 2012-11-10 MED ORDER — ALUM & MAG HYDROXIDE-SIMETH 200-200-20 MG/5ML PO SUSP
30.0000 mL | Freq: Four times a day (QID) | ORAL | Status: DC | PRN
Start: 2012-11-10 — End: 2012-11-12
  Filled 2012-11-10: qty 30

## 2012-11-10 MED ORDER — VANCOMYCIN HCL 10 G IV SOLR
1500.0000 mg | Freq: Two times a day (BID) | INTRAVENOUS | Status: DC
  Administered 2012-11-10 – 2012-11-11 (×2): 1500 mg via INTRAVENOUS
  Filled 2012-11-10 (×5): qty 1500

## 2012-11-10 MED ORDER — LORAZEPAM 2 MG/ML IJ SOLN: 0.0000 mg | Freq: Four times a day (QID) | INTRAMUSCULAR | Status: DC

## 2012-11-10 MED ORDER — ACETAMINOPHEN 650 MG RE SUPP: 650.0000 mg | Freq: Four times a day (QID) | RECTAL | Status: DC | PRN

## 2012-11-10 NOTE — ED Notes (Signed)
Pt. Is homeless and last night went behind a building to take a pee and fell and has a scratch on his arm.  Pt. has a puncture between his fingers.  Pt. Believes he has an allergic reaction.

## 2012-11-10 NOTE — ED Provider Notes (Signed)
CSN: [id number]     Arrival date & time 11/10/12  1713 History     First MD Initiated Contact with Patient 11/10/12 1714     Chief Complaint  Patient presents with  . Hand Swelling    HPI 57 year old male with history of alcohol and drug use, COPD, and CHF presents with right hand and wrist swelling.  Patient is homeless and reports that last night he scraped his right forearm on a tree.  The following morning he developed severe right hand and wrist pain and swelling.  He denies any trauma or fall.  He does not recall any other injury.  Currently he endorses severe pain and inability to move his fingers and wrist.  No associated fevers or chills.   Past Medical History  Diagnosis Date  . Heart failure   . Arthritis   . Alcohol abuse     Heavy alcohol abuse and homelessness  . Tobacco abuse   . Cocaine abuse     And crack   . Homelessness   . COPD (chronic obstructive pulmonary disease)   . Suicide attempt   . CHF (congestive heart failure)    Past Surgical History  Procedure Laterality Date  . Inner ear surgery     Family History  Problem Relation Age of Onset  . Coronary artery disease    . Diabetes     History  Substance Use Topics  . Smoking status: Current Every Day Smoker -- 2.00 packs/day    Types: Cigarettes  . Smokeless tobacco: Never Used  . Alcohol Use: 100.8 oz/week    168 Cans of beer per week     Comment: Heavy. 5-10 40oz daily    Review of Systems  Constitutional: Negative for fever and chills.  Respiratory: Positive for shortness of breath.   Cardiovascular: Negative for chest pain.  Gastrointestinal: Negative for nausea, vomiting and abdominal pain.  Musculoskeletal:       Reports right hand/finger/wrist swelling and pain.    Allergies  Review of patient's allergies indicates no known allergies.  Home Medications   Current Outpatient Rx  Name  Route  Sig  Dispense  Refill  . aspirin EC 81 MG EC tablet   Oral   Take 1 tablet (81 mg  total) by mouth daily.   30 tablet   12   . mometasone-formoterol (DULERA) 100-5 MCG/ACT AERO   Inhalation   Inhale 2 puffs into the lungs 2 (two) times daily.   13 g   11   . pantoprazole (PROTONIX) 40 MG tablet   Oral   Take 1 tablet (40 mg total) by mouth daily at 12 noon.   30 tablet   11    BP 144/99  Pulse 111  Temp(Src) 98.7 F (37.1 C) (Oral)  Resp 20  SpO2 96% Physical Exam  Constitutional: He is oriented to person, place, and time. He appears well-developed. No distress.  HENT:  Head: Normocephalic and atraumatic.  Cardiovascular: Regular rhythm and normal heart sounds.   No murmur heard. Tachycardic.  Pulmonary/Chest: Effort normal and breath sounds normal. He has no wheezes. He has no rales.  Abdominal: Soft. There is no tenderness.  Musculoskeletal: He exhibits edema and tenderness.  Right hand/fingers/wrist: edematous and warm to touch.  Active and passive ROM minimal secondary to pain.  Small healing cut noted on lateral forearm.  Neurological: He is alert and oriented to person, place, and time.  Skin: There is erythema.   ED Course  Procedures (including critical care time)  Labs Reviewed  CBC - Abnormal; Notable for the following:    Hemoglobin 18.2 (*)    MCHC 36.5 (*)    All other components within normal limits  COMPREHENSIVE METABOLIC PANEL - Abnormal; Notable for the following:    Glucose, Bld 122 (*)    All other components within normal limits  URIC ACID - Abnormal; Notable for the following:    Uric Acid, Serum 10.9 (*)    All other components within normal limits    Dg Wrist Complete Right  11/10/2012   *RADIOLOGY REPORT*  Clinical Data: Cut wrist on tree, wrist/hand swelling  RIGHT WRIST - COMPLETE 3+ VIEW  Comparison: None.  Findings: No fracture or dislocation is seen.  Mild degenerative changes involving the distal radial ulnar articulation.  Mild dorsal soft tissue swelling.  No radiopaque foreign body is seen.  IMPRESSION: No  fracture, dislocation, or radiopaque foreign body is seen.  Mild soft tissue swelling.   Original Report Authenticated By: Charline Bills, M.D.   Dg Hand Complete Right  11/10/2012   **ADDENDUM** CREATED: 11/10/2012 18:30:55  Findings were discussed with Dr. Adriana Simas.  He notes that the patient is a poor historian and was uncertain about the reported mechanism of injury (cut wrist on tree).  Although the fracture was originally favored to be chronic with presumed bony bridging, this could reflect impaction of an acute fracture.  This would also explain the associated soft tissue swelling.  However, it would require a different mechanism of injury with axial loading on the 5th metacarpal.  ADDENDED IMPRESSION:  Fracture involving the proximal fifth metacarpal, age indeterminate.  Moderate soft tissue swelling overlying the ulnar/dorsal aspect of the hand.  No radiopaque foreign body is seen.  **END ADDENDUM** SIGNED BY: Charline Bills, M.D.  11/10/2012   *RADIOLOGY REPORT*  Clinical Data: Cut wrist on tree, wrist/hand swelling  RIGHT HAND - COMPLETE 3+ VIEW  Comparison: None.  Findings: Fracture deformity involving the proximal fifth metacarpal, favored to be chronic.  The joint spaces are preserved.  Moderate soft tissue swelling overlying the dorsum of the hand.  No radiopaque foreign body is seen.  IMPRESSION: Suspected chronic fracture deformity involving the proximal fifth metacarpal.  Moderate soft tissue swelling overlying the dorsum of the hand.  No radiopaque foreign body is seen.   Original Report Authenticated By: Charline Bills, M.D.   No diagnosis found.  MDM  58 year old male with history of alcohol and drug use, COPD, and CHF presents with right hand and wrist swelling. - 5th metacarpal fracture noted on xray.  Radiologist unclear whether this is acute or chronic. - Pain and swelling appears out of proportion to fracture.  Uric acid markedly elevated.  Clinical picture consistent with Gout  and ? Underlying acute fracture. - Will consult hand surgery for evaluation.   - Given that patient is homeless and has no follow up or access to medication, will admit to inpatient (case discussed with hospitalist).   Everlene Other DO Family Medicine PGY-2   Tommie Sams, DO 11/10/12 [phone]

## 2012-11-10 NOTE — Progress Notes (Signed)
ANTIBIOTIC CONSULT NOTE - INITIAL  Pharmacy Consult for Vancomycin/Zosyn Indication: right hand swelling/cellulitis  No Known Allergies  Patient Measurements: Height: 5' 6.93" (170 cm) Weight: 208 lb 5.4 oz (94.5 kg) IBW/kg (Calculated) : 65.94  Vital Signs: Temp: 98.7 F (37.1 C) (08/03 1729) Temp src: Oral (08/03 1729) BP: 144/99 mmHg (08/03 1730) Pulse Rate: 111 (08/03 1745) Intake/Output from previous day:   Intake/Output from this shift:    Labs:  Recent Labs  11/10/12 1740  WBC 8.4  HGB 18.2*  PLT 185  CREATININE 0.89   Estimated Creatinine Clearance: 103.7 ml/min (by C-G formula based on Cr of 0.89). No results found for this basename: VANCOTROUGH, VANCOPEAK, VANCORANDOM, GENTTROUGH, GENTPEAK, GENTRANDOM, TOBRATROUGH, TOBRAPEAK, TOBRARND, AMIKACINPEAK, AMIKACINTROU, AMIKACIN,  in the last 72 hours   Microbiology: No results found for this or any previous visit (from the past 720 hour(s)).  Medical History: Past Medical History  Diagnosis Date  . Heart failure   . Arthritis   . Alcohol abuse     Heavy alcohol abuse and homelessness  . Tobacco abuse   . Cocaine abuse     And crack   . Homelessness   . COPD (chronic obstructive pulmonary disease)   . Suicide attempt   . CHF (congestive heart failure)     Medications:   (Not in a hospital admission) Assessment: 57 y/o male patient admitted with right hand swelling requiring broad spectrum antibiotics for suspected cellulitis.  Goal of Therapy:  Vancomycin trough level 10-15 mcg/ml  Plan:  Vancomycin 1500mg  IV q12, zosyn 3.375g IV q8h and monitor renal function. Measure antibiotic drug levels at steady state Follow up culture results  Verlene Mayer, PharmD, BCPS Pager [phone] 11/10/2012,7:20 PM

## 2012-11-10 NOTE — Progress Notes (Signed)
Orthopedic Tech Progress Note Patient Details:  Brian [name] March 19, 1956 [id number]  Ortho Devices Type of Ortho Device: Ace wrap;Ulna gutter splint Ortho Device/Splint Location: right hand Ortho Device/Splint Interventions: Application   Marna Weniger 11/10/2012, 7:21 PM

## 2012-11-10 NOTE — ED Provider Notes (Signed)
I saw and evaluated the patient, reviewed the resident's note and I agree with the findings and plan.   .Face to face Exam:  General:  Awake HEENT:  Atraumatic Resp:  Normal effort Abd:  Nondistended Neuro:No focal weakness  Hand surgery(Dr. Melvyn Novas) was consulted and will come in emergency department to evaluate the patient.  Nelia Shi, MD 11/10/12 [phone]

## 2012-11-10 NOTE — ED Notes (Signed)
Pt c/o pain and swelling to right hand and wrist. Pt sts he cut his wrist on a tree last night when he got up to use the restroom. Reports the swelling has increased over the night and day, no redness seen. Denies hx of gout. sts he drinks ETOH everyday. Pt sts he is homeless and hasn't been taking any of his daily meds. sts he does feel sob at some points but doesn't use oxygen. Pt in nad, skin warm and dry, resp e/u.

## 2012-11-10 NOTE — Progress Notes (Signed)
Attempted to call to get report from ED at 1930; ED RN requested to call floor RN back.  C.Zenith Kercheval, RN.

## 2012-11-10 NOTE — Consult Note (Signed)
ED NOTES REVIEWED CASE DISCUSSED WITH ED STAFF/AND MEDICINE NOTE REVIEWED PT SEEN/EXAMINED PT ON EXAM HAS VERY TENDER SMALL FINGER METACARPAL ON RIGHT HAND C/W ACUTE FRACTURE NO PAIN WITH PASSIVE MOTION OF WRIST WHEN I STABILIZE SMALL FINGER NO PAIN WITH PASSIVE MOTION OF INDEX/LONG/RING WHEN I STABILIZE SMALL FINGER. SMALL ABRASION OVER ULNAR ASPECT OF FOREARM NO EVIDENCE OF DEEP SPACE INFECTION/CELLULITIS OR TENOSYNOVITIS RADIOGRAPHS REVIEWED PT WITH MINIMALLY DISPLACED SMALL FINGER METACARPAL FRACTURE/BASE HIS RADIOGRAPHS SHOW IRREGULARITY AND CHRONIC INJURY TO RIGHT RING FINGER PIP JOINT NO ACUTE ABNORMALITY  IMPRESSION: RIGHT SMALL FINGER METACARPAL FRACTURE GOUT-BASED ON SEROLOGIES URIC ACID LEVEL ALCOHOLISM  PLAN:ULNAR GUTTER SPLINT  ICE IMMOBILIZATION ELEVATE  WOULD FOLLOW UP IN OFFICE IN 10-14 DAYS FROM HAND STANDPOINT DO NOT RECOMMEND IV ANTIBIOTICS, NO INDICATION FOR ACUTE FRACTURE WILL BE TOUGH FOR HIM TO F/U IN OFFICE WILL NEED SOCIAL WORK ASSISTANCE NEW SPLINT APPLIED PAIN MEDS PER PRIMARY TEAM

## 2012-11-10 NOTE — H&P (Addendum)
PATIENT DETAILS Name: Brian [name] Age: 57 y.o. Sex: male Date of Birth: [birth year] Admit Date: 11/10/2012 PCP:[name] Patient   CHIEF COMPLAINT:  Right hand dorsum swelling-for one day  HPI: Brian [name] is a 57 y.o. male with a Past Medical History of COPD, ongoing tobacco use, EtOH and cocaine use who presents today with the above noted complaint. Patient is homeless. He claims that he scratched the proximal aspect of his right forearm yesterday. During the night he started having pain in his fingers and the dorsum of his right hand. This morning, patient woke up and found swelling and redness mostly in the dorsal aspect of his right hand. It was significantly tender. During the course of the day, redness tracked up to the proximal aspect of his right forearm. The pain then became excruciating, he then called 911 and was brought to the emergency room for further evaluation and treatment. In the emergency room, a x-ray of the right hand demonstrated soft tissue swelling, and a fracture involving the proximal fifth metacarpal. Patient denies any recent trauma or fall. However, patient is homeless and is actively abusing cocaine and alcohol. He is a poor historian. I was asked to admit this patient for further evaluation and treatment. During my evaluation, patient did appear in some discomfort in his right dorsal hand area. He did smell of alcohol. He claims that he smokes cigarettes daily, and uses cocaine intermittently. He denies any IV drug use, he claims he smokes cocaine. He is homeless.  He denied any active chest pain, shortness of breath, nausea, vomiting or diarrhea. He denies any abdominal pain.  ALLERGIES:  No Known Allergies  PAST MEDICAL HISTORY: Past Medical History  Diagnosis Date  . Heart failure   . Arthritis   . Alcohol abuse     Heavy alcohol abuse and homelessness  . Tobacco abuse   . Cocaine abuse     And crack   . Homelessness   . COPD (chronic obstructive  pulmonary disease)   . Suicide attempt   . CHF (congestive heart failure)     PAST SURGICAL HISTORY: Past Surgical History  Procedure Laterality Date  . Inner ear surgery      MEDICATIONS AT HOME: Prior to Admission medications   Medication Sig Start Date End Date Taking? Authorizing Provider  aspirin EC 81 MG EC tablet Take 1 tablet (81 mg total) by mouth daily. 08/28/12   Christen Bame, MD  mometasone-formoterol (DULERA) 100-5 MCG/ACT AERO Inhale 2 puffs into the lungs 2 (two) times daily. 08/28/12   Christen Bame, MD  pantoprazole (PROTONIX) 40 MG tablet Take 1 tablet (40 mg total) by mouth daily at 12 noon. 08/28/12   Christen Bame, MD    FAMILY HISTORY: Family History  Problem Relation Age of Onset  . Coronary artery disease    . Diabetes      SOCIAL HISTORY:  reports that he has been smoking Cigarettes.  He has been smoking about 2.00 packs per day. He has never used smokeless tobacco. He reports that he drinks about 100.8 ounces of alcohol per week. He reports that he uses illicit drugs (Marijuana and Cocaine).  REVIEW OF SYSTEMS:  Constitutional:   No  weight loss, night sweats,  Fevers, chills, fatigue.  HEENT:    No headaches, Difficulty swallowing,Tooth/dental problems,Sore throat,  No sneezing, itching, ear ache, nasal congestion, post nasal drip   Cardio-vascular: No chest pain,  Orthopnea, PND, swelling in lower extremities, anasarca,dizziness, palpitations  GI:  No heartburn, indigestion, abdominal pain, nausea, vomiting, diarrhea, change in   bowel habits, loss of appetite  Resp: No shortness of breath with exertion or at rest.  No excess mucus, no productive cough, No non-productive cough,  No coughing up of blood.No change in color of mucus.No wheezing.No chest wall deformity  Skin:  no rash or lesions.  GU:  no dysuria, change in color of urine, no urgency or frequency.  No flank pain.  Musculoskeletal:  No back pain.  Psych: No change in mood or affect.  No depression or anxiety.  No memory loss.   PHYSICAL EXAM: Blood pressure 144/99, pulse 111, temperature 98.7 F (37.1 C), temperature source Oral, resp. rate 20, SpO2 96.00%.  General appearance :Awake, alert, not in any distress. Speech Clear. Not toxic Looking. Smells of alcohol. Very unkept. HEENT: Atraumatic and Normocephalic, pupils equally reactive to light and accomodation Neck: supple, no JVD. No cervical lymphadenopathy.  Chest:Good air entry bilaterally, no added sounds  CVS: S1 S2 regular, no murmurs.  Abdomen: Bowel sounds present, Non tender and not distended with no gaurding, rigidity or rebound. Lower Extremities: B/L Lower Ext shows no edema, both legs are warm to touch Neurology: Awake alert, and oriented X 3, CN II-XII intact, Non focal Skin:No Rash Wounds:N/A Musculoskeletal:  -Right hand dorsum significantly swollen and erythematous. Exquisitely tender. Some of the erythema tracks onto the proximal aspect of his forearm. Decreased range of motion during passive extension of the fingers. Patient is also unable to make a fist.  LABS ON ADMISSION:   Recent Labs  11/10/12 1740  NA 137  K 4.0  CL 97  CO2 24  GLUCOSE 122*  BUN 10  CREATININE 0.89  CALCIUM 9.6    Recent Labs  11/10/12 1740  AST 32  ALT 24  ALKPHOS 42  BILITOT 0.3  PROT 7.8  ALBUMIN 4.2   No results found for this basename: LIPASE, AMYLASE,  in the last 72 hours  Recent Labs  11/10/12 1740  WBC 8.4  HGB 18.2*  HCT 49.9  MCV 91.6  PLT 185   No results found for this basename: CKTOTAL, CKMB, CKMBINDEX, TROPONINI,  in the last 72 hours No results found for this basename: DDIMER,  in the last 72 hours No components found with this basename: POCBNP,    RADIOLOGIC STUDIES ON ADMISSION: Dg Wrist Complete Right  11/10/2012   *RADIOLOGY REPORT*  Clinical Data: Cut wrist on tree, wrist/hand swelling  RIGHT WRIST - COMPLETE 3+ VIEW  Comparison: None.  Findings: No fracture or  dislocation is seen.  Mild degenerative changes involving the distal radial ulnar articulation.  Mild dorsal soft tissue swelling.  No radiopaque foreign body is seen.  IMPRESSION: No fracture, dislocation, or radiopaque foreign body is seen.  Mild soft tissue swelling.   Original Report Authenticated By: Charline Bills, M.D.   Dg Hand Complete Right  11/10/2012   **ADDENDUM** CREATED: 11/10/2012 18:30:55  Findings were discussed with Dr. Adriana Simas.  He notes that the patient is a poor historian and was uncertain about the reported mechanism of injury (cut wrist on tree).  Although the fracture was originally favored to be chronic with presumed bony bridging, this could reflect impaction of an acute fracture.  This would also explain the associated soft tissue swelling.  However, it would require a different mechanism of injury with axial loading on the 5th metacarpal.  ADDENDED IMPRESSION:  Fracture involving the proximal fifth metacarpal, age indeterminate.  Moderate soft tissue swelling  overlying the ulnar/dorsal aspect of the hand.  No radiopaque foreign body is seen.  **END ADDENDUM** SIGNED BY: Charline Bills, M.D.  11/10/2012   *RADIOLOGY REPORT*  Clinical Data: Cut wrist on tree, wrist/hand swelling  RIGHT HAND - COMPLETE 3+ VIEW  Comparison: None.  Findings: Fracture deformity involving the proximal fifth metacarpal, favored to be chronic.  The joint spaces are preserved.  Moderate soft tissue swelling overlying the dorsum of the hand.  No radiopaque foreign body is seen.  IMPRESSION: Suspected chronic fracture deformity involving the proximal fifth metacarpal.  Moderate soft tissue swelling overlying the dorsum of the hand.  No radiopaque foreign body is seen.   Original Report Authenticated By: Charline Bills, M.D.    ASSESSMENT AND PLAN: Present on Admission:  . Right hand swelling/erythema - Suspect this to be either a soft tissue infection or gout given significant uric acid elevation. He does  not have a fever and leukocytosis, but does abuse cocaine (denies IV drug use). - At this time, will admit to a regular med surg bed and place on empiric IV vancomycin and IV Zosyn. We will also start him on scheduled naproxen. - ED M.D. has consulted hand surgery, we will await their recommendations and would defer further radiological studies to them. - I am not sure whether his symptoms/signs can be explained by a proximal fifth metacarpal fracture. Await hand surgery opinion.  . Proximal fifth metacarpal fracture - Patient denies any recent trauma or fall, not sure whether this is acute, patient is a poor historian however suspect this is to be remote. - Await hand surgery opinion  . Alcohol abuse - Smells of alcohol, placeon Ativan per CIWA  Protocol - Monitor for withdrawal  - Claims he drinks 2-3 40 ounces of beer daily.   Marland Kitchen COPD (chronic obstructive pulmonary disease) - Lungs clear, placed on scheduled nebulized bronchodilators.   . Cocaine abuse - Counseled extensively. - As noted above, denies IVDA  Further plan will depend as patient's clinical course evolves and further radiologic and laboratory data become available. Patient will be monitored closely.  DVT Prophylaxis: Prophylactic Lovenox   Code Status: Full Code  Total time spent for admission equals 45 minutes.  [facility] Pager [phone]  If 7PM-7AM, please contact night-coverage [email or web address] Password [facility] 11/10/2012, 6:59 PM

## 2012-11-11 DIAGNOSIS — L03119 Cellulitis of unspecified part of limb: Secondary | ICD-10-CM

## 2012-11-11 DIAGNOSIS — M109 Gout, unspecified: Principal | ICD-10-CM

## 2012-11-11 DIAGNOSIS — F1010 Alcohol abuse, uncomplicated: Secondary | ICD-10-CM

## 2012-11-11 DIAGNOSIS — S62309A Unspecified fracture of unspecified metacarpal bone, initial encounter for closed fracture: Secondary | ICD-10-CM

## 2012-11-11 LAB — COMPREHENSIVE METABOLIC PANEL
ALT: 18 U/L (ref 0–53)
AST: 18 U/L (ref 0–37)
CO2: 26 mEq/L (ref 19–32)
Calcium: 9.4 mg/dL (ref 8.4–10.5)
Chloride: 103 mEq/L (ref 96–112)
GFR calc Af Amer: >90 mL/min (ref >90)
GFR calc non Af Amer: >90 mL/min (ref >90)
Glucose, Bld: 103 mg/dL — ABNORMAL HIGH (ref 70–99)
Sodium: 140 mEq/L (ref 135–145)
Total Bilirubin: 0.2 mg/dL — ABNORMAL LOW (ref 0.3–1.2)

## 2012-11-11 LAB — CBC
MCH: 32.5 pg (ref 26.0–34.0)
MCHC: 34.8 g/dL (ref 30.0–36.0)
Platelets: 143 10*3/uL — ABNORMAL LOW (ref 150–400)
RDW: 13.2 % (ref 11.5–15.5)

## 2012-11-11 LAB — PROTIME-INR
INR: 0.93 (ref 0.00–1.49)
Prothrombin Time: 12.3 seconds (ref 11.6–15.2)

## 2012-11-11 MED ORDER — DOXYCYCLINE HYCLATE 100 MG PO TABS
100.0000 mg | ORAL_TABLET | Freq: Two times a day (BID) | ORAL | Status: DC
  Administered 2012-11-12 (×2): 100 mg via ORAL
  Filled 2012-11-11 (×3): qty 1

## 2012-11-11 NOTE — Progress Notes (Signed)
Nursing Admission Note - Late Entry  Pt arrived to 6E09 via stretcher from the ED 11/10/12 at 2030. A&Ox4, no distress noted. Pt c/o 10/10 pain in his right hand. Ortho splint and ACE wrap intact to right hand - fingers warm, pt able to move them without difficulty. Pt states he's currently homeless - SW consult placed. Pt has hx of ETOH and drug use; educated pt on need to obtain UDS. Oriented to unit and surroundings. Call bell within reach. Will continue to monitor.

## 2012-11-11 NOTE — Progress Notes (Signed)
[facility] PROGRESS NOTE  Brian [name] HYQ:[id number] DOB: 06/01/1955 DOA: 11/10/2012 PCP: [name] Patient  Assessment/Plan: Right hand swelling/erythema  -Secondary to acute gout and proximal fifth metacarpal fracture.  Also has superficial small abrasion over ulnar aspect of forearm. -Ulnar gutter splint applied by hand surgeon. -Uric acid level repeated. -Patient started on Naprosyn twice a day and empiric antibiotic with concern for cellulitis. -Narrow down antibiotic oral doxycycline. Pain control with NSAIDs and oxycodone. -Patient to followup with Dr. Melvyn Novas in 2 weeks  - . Proximal fifth metacarpal fracture  - Patient denies any recent trauma or fall -ulnar gutter splint applied. Pain control and outpt hand surgery follow up  . Alcohol abuse  -placed on Ativan per CIWA Protocol  - Monitor for withdrawal  -reports drinking 2-3  40 ounces of beer daily.   . Cocaine abuse  - Counseled on cessation   . COPD (chronic obstructive pulmonary disease)  - cont scheduled nebulized bronchodilators.    DVT prophylaxis  Code Status: Full code Family Communication: None  Disposition Plan: SW consulted for homelessness issue . d/c in am if stable   Consultants:  Hand surgery  Procedures:  Rt forearm splint  Antibiotics: IV vanco and zosyn ( 8/3-8/4) Po doxycycline 8/4>>  HPI/Subjective: reports pain over rt wrist and fingers  Objective: Filed Vitals:   11/11/12 1345  BP: 146/82  Pulse: 81  Temp: 98.1 F (36.7 C)  Resp: 18    Intake/Output Summary (Last 24 hours) at 11/11/12 1540 Last data filed at 11/11/12 1400  Gross per 24 hour  Intake 3368.33 ml  Output   2200 ml  Net 1168.33 ml   Filed Weights   11/10/12 1745 11/10/12 2020  Weight: 94.5 kg (208 lb 5.4 oz) 92.1 kg (203 lb 0.7 oz)    Exam:   General:  Middle aged dishelved male in NAD  HEENT: no pallor, poor dentition, moist mucosa  Chest; clear b/l, no added sounds  CVS:  NS1&S2, no murmurs, rubs or gallop  Abd: soft, NT, ND, BS+  Ext: warm, ace wrap post splint over rt hand, tender ROM of wrist and fingers CNS: AAOX3 Data Reviewed: Basic Metabolic Panel:  Recent Labs Lab 11/10/12 1740 11/11/12 0520  NA 137 140  K 4.0 4.3  CL 97 103  CO2 24 26  GLUCOSE 122* 103*  BUN 10 13  CREATININE 0.89 0.83  CALCIUM 9.6 9.4   Liver Function Tests:  Recent Labs Lab 11/10/12 1740 11/11/12 0520  AST 32 18  ALT 24 18  ALKPHOS 42 33*  BILITOT 0.3 0.2*  PROT 7.8 6.3  ALBUMIN 4.2 3.4*   No results found for this basename: LIPASE, AMYLASE,  in the last 168 hours No results found for this basename: AMMONIA,  in the last 168 hours CBC:  Recent Labs Lab 11/10/12 1740 11/11/12 0520  WBC 8.4 8.0  HGB 18.2* 16.1  HCT 49.9 46.3  MCV 91.6 93.3  PLT 185 143*   Cardiac Enzymes: No results found for this basename: CKTOTAL, CKMB, CKMBINDEX, TROPONINI,  in the last 168 hours BNP (last 3 results)  Recent Labs  08/27/12 0655  PROBNP 101.8   CBG: No results found for this basename: GLUCAP,  in the last 168 hours  No results found for this or any previous visit (from the past 240 hour(s)).   Studies: Dg Wrist Complete Right  11/10/2012   *RADIOLOGY REPORT*  Clinical Data: Cut wrist on tree, wrist/hand swelling  RIGHT WRIST -  COMPLETE 3+ VIEW  Comparison: None.  Findings: No fracture or dislocation is seen.  Mild degenerative changes involving the distal radial ulnar articulation.  Mild dorsal soft tissue swelling.  No radiopaque foreign body is seen.  IMPRESSION: No fracture, dislocation, or radiopaque foreign body is seen.  Mild soft tissue swelling.   Original Report Authenticated By: Charline Bills, M.D.   Dg Hand Complete Right  11/10/2012   **ADDENDUM** CREATED: 11/10/2012 18:30:55  Findings were discussed with Dr. Adriana Simas.  He notes that the patient is a poor historian and was uncertain about the reported mechanism of injury (cut wrist on tree).   Although the fracture was originally favored to be chronic with presumed bony bridging, this could reflect impaction of an acute fracture.  This would also explain the associated soft tissue swelling.  However, it would require a different mechanism of injury with axial loading on the 5th metacarpal.  ADDENDED IMPRESSION:  Fracture involving the proximal fifth metacarpal, age indeterminate.  Moderate soft tissue swelling overlying the ulnar/dorsal aspect of the hand.  No radiopaque foreign body is seen.  **END ADDENDUM** SIGNED BY: Charline Bills, M.D.  11/10/2012   *RADIOLOGY REPORT*  Clinical Data: Cut wrist on tree, wrist/hand swelling  RIGHT HAND - COMPLETE 3+ VIEW  Comparison: None.  Findings: Fracture deformity involving the proximal fifth metacarpal, favored to be chronic.  The joint spaces are preserved.  Moderate soft tissue swelling overlying the dorsum of the hand.  No radiopaque foreign body is seen.  IMPRESSION: Suspected chronic fracture deformity involving the proximal fifth metacarpal.  Moderate soft tissue swelling overlying the dorsum of the hand.  No radiopaque foreign body is seen.   Original Report Authenticated By: Charline Bills, M.D.    Scheduled Meds: . albuterol  2.5 mg Nebulization TID  . antiseptic oral rinse  15 mL Mouth Rinse BID  . aspirin EC  81 mg Oral Daily  . enoxaparin (LOVENOX) injection  40 mg Subcutaneous Q24H  . folic acid  1 mg Oral Daily  . ipratropium  0.5 mg Nebulization TID  . LORazepam  0-4 mg Intravenous Q6H   Followed by  . [START ON 11/12/2012] LORazepam  0-4 mg Intravenous Q12H  . multivitamin with minerals  1 tablet Oral Daily  . naproxen  500 mg Oral BID WC  . pantoprazole  40 mg Oral Q1200  . piperacillin-tazobactam (ZOSYN)  IV  3.375 g Intravenous Q8H  . thiamine  100 mg Oral Daily   Or  . thiamine  100 mg Intravenous Daily  . vancomycin  1,500 mg Intravenous Q12H   Continuous Infusions: . sodium chloride 100 mL/hr at 11/10/12 2037       Time spent: 25 minutes    Wileen Duncanson  [facility] Pager [phone]. If 7PM-7AM, please contact night-coverage at [email or web address], password [facility] 11/11/2012, 3:40 PM  LOS: 1 day

## 2012-11-12 DIAGNOSIS — M109 Gout, unspecified: Secondary | ICD-10-CM | POA: Diagnosis present

## 2012-11-12 DIAGNOSIS — S62308A Unspecified fracture of other metacarpal bone, initial encounter for closed fracture: Secondary | ICD-10-CM

## 2012-11-12 LAB — DRUGS OF ABUSE SCREEN W/O ALC, ROUTINE URINE
Benzodiazepines.: NEGATIVE
Creatinine,U: 86.2 mg/dL
Marijuana Metabolite: NEGATIVE
Opiate Screen, Urine: POSITIVE — AB

## 2012-11-12 MED ORDER — NAPROXEN 500 MG PO TABS: 250.0000 mg | ORAL_TABLET | Freq: Three times a day (TID) | ORAL | Status: DC

## 2012-11-12 MED ORDER — ALBUTEROL SULFATE HFA 108 (90 BASE) MCG/ACT IN AERS
2.0000 | INHALATION_SPRAY | Freq: Four times a day (QID) | RESPIRATORY_TRACT | Status: DC | PRN
  Filled 2012-11-12: qty 6.7

## 2012-11-12 MED ORDER — OXYCODONE-ACETAMINOPHEN 10-325 MG PO TABS: 1.0000 | ORAL_TABLET | ORAL | Status: DC | PRN

## 2012-11-12 MED ORDER — DOXYCYCLINE HYCLATE 100 MG PO TABS: 100.0000 mg | ORAL_TABLET | Freq: Two times a day (BID) | ORAL | Status: DC

## 2012-11-12 NOTE — Progress Notes (Signed)
Patient discharge Home per Md order.  Discharge instructions reviewed with patient and family.  Copies of all forms given and explained. Patient/family voiced understanding of all instructions.  Discharge in no acute distress. 

## 2012-11-12 NOTE — Discharge Summary (Signed)
Physician Discharge Summary  Cashmere Harmes HYQ:[id number] DOB: 11-07-1955 DOA: 11/10/2012  PCP: [name] Patient  Admit date: 11/10/2012 Discharge date: 11/12/2012  Time spent: 40  minutes  Recommendations for Outpatient Follow-up:  D/c Home with [facility] Follow up with Dr Melvyn Novas in 2 weeks ( hand surgery clinic)  Discharge Diagnoses:  Principal Problem:  acute  Gout attack  Active Problems:   Alcohol abuse   COPD (chronic obstructive pulmonary disease)   Homelessness   Cocaine abuse   Cellulitis of right hand   Closed fracture of 5th metacarpal   Discharge Condition: fair  Diet recommendation: regular  Filed Weights   11/10/12 1745 11/10/12 2020 11/11/12 2042  Weight: 94.5 kg (208 lb 5.4 oz) 92.1 kg (203 lb 0.7 oz) 93.4 kg (205 lb 14.6 oz)    History of present illness:  Please refer to admission H&P for details, but in brief, 57 y.o. male with a Past Medical History of COPD, ongoing tobacco use, EtOH and cocaine use who presented with pain and swelling over dorsum of rt hand for 1 day. Patient is homeless. During the night he started having pain in his fingers and the dorsum of his right hand. On the morning of admission, patient woke up and found swelling and redness mostly in the dorsal aspect of his right hand. It was significantly tender. During the course of the day, redness tracked up to the proximal aspect of his right forearm. The pain then became excruciating, he then called 911 and was brought to the emergency room for further evaluation and treatment. In the emergency room, a x-ray of the right hand demonstrated soft tissue swelling, and a fracture involving the proximal fifth metacarpal. Patient denies any recent trauma or fall. However, patient is homeless and is actively abusing cocaine and alcohol. [facility] asked to admit this patient for further evaluation and treatment.  Hospital Course:  Right hand swelling/erythema   -Secondary to acute gout and proximal fifth metacarpal fracture. Also has superficial small abrasion over ulnar aspect of forearm.  -Ulnar gutter splint applied by hand surgeon.  -Uric acid level elevated -Patient started on Naprosyn twice a day and empiric antibiotic with concern for cellulitis.  -Narrowed down antibiotic to oral doxycycline. Pain control with NSAIDs and oxycodone.  -Patient to followup with Dr. Melvyn Novas in 2 weeks  -  . Proximal fifth metacarpal fracture  - Patient denies any recent trauma or fall  -ulnar gutter splint applied. Pain control and outpt hand surgery follow up   . Alcohol abuse  -placed on Ativan per CIWA Protocol  - no signs of  withdrawal  -reports drinking 2-3   40 ounces of beer daily.   . Cocaine abuse  - Counseled on cessation   . COPD (chronic obstructive pulmonary disease)  - gven  scheduled nebulized bronchodilators.    patient clinically stable for discharge. brother wishes to take him home. Counseled extensively on etoh cessation. counseled on tobacco and cocaine cessation.     Code Status: Full code  Family Communication: discussed with brother Disposition Plan: home  Consultants:  Hand surgery   Procedures:  Rt forearm splint   Antibiotics:  IV vanco and zosyn ( 8/3-8/4)  Po doxycycline 8/4>>treat until 8/10   Discharge Exam: Filed Vitals:   11/12/12 0910  BP: 148/83  Pulse: 81  Temp: 97.8 F (36.6 C)  Resp: 18   General: Middle aged dishelved male in NAD  HEENT: no pallor, poor  dentition, moist mucosa  Chest; clear b/l, no added sounds  CVS: NS1&S2, no murmurs, rubs or gallop  Abd: soft, NT, ND, BS+ Ext: warm, ace wrap post splint over rt hand, tender ROM of wrist and fingers CNS: AAOX3 , non focal    Discharge Instructions   Future Appointments Provider Department Dept Phone   11/22/2012 11:30 AM [facility] Covering Provider  [facility] [phone]       Medication List          aspirin 81 MG EC tablet  Take 1 tablet (81 mg total) by mouth daily.     doxycycline 100 MG tablet  Commonly known as:  VIBRA-TABS  Take 1 tablet (100 mg total) by mouth every 12 (twelve) hours.     mometasone-formoterol 100-5 MCG/ACT Aero  Commonly known as:  DULERA  Inhale 2 puffs into the lungs 2 (two) times daily.     naproxen 500 MG tablet  Commonly known as:  NAPROSYN  Take 0.5 tablets (250 mg total) by mouth 3 (three) times daily with meals.     oxyCODONE-acetaminophen 10-325 MG per tablet  Commonly known as:  PERCOCET  Take 1 tablet by mouth every 4 (four) hours as needed for pain.     pantoprazole 40 MG tablet  Commonly known as:  PROTONIX  Take 1 tablet (40 mg total) by mouth daily at 12 noon.       No Known Allergies     Follow-up Information   Schedule an appointment as soon as possible for a visit to follow up.   Contact information:   [facility] and [address], [place], [place], [place] [phone] November 22, 2012 at 11:30am       The results of significant diagnostics from this hospitalization (including imaging, microbiology, ancillary and laboratory) are listed below for reference.    Significant Diagnostic Studies: Dg Wrist Complete Right  11/10/2012   *RADIOLOGY REPORT*  Clinical Data: Cut wrist on tree, wrist/hand swelling  RIGHT WRIST - COMPLETE 3+ VIEW  Comparison: None.  Findings: No fracture or dislocation is seen.  Mild degenerative changes involving the distal radial ulnar articulation.  Mild dorsal soft tissue swelling.  No radiopaque foreign body is seen.  IMPRESSION: No fracture, dislocation, or radiopaque foreign body is seen.  Mild soft tissue swelling.   Original Report Authenticated By: Charline Bills, M.D.   Dg Hand Complete Right  11/10/2012   **ADDENDUM** CREATED: 11/10/2012 18:30:55  Findings were discussed with Dr. Adriana Simas.  He notes that the patient is a poor historian and was uncertain about the reported  mechanism of injury (cut wrist on tree).  Although the fracture was originally favored to be chronic with presumed bony bridging, this could reflect impaction of an acute fracture.  This would also explain the associated soft tissue swelling.  However, it would require a different mechanism of injury with axial loading on the 5th metacarpal.  ADDENDED IMPRESSION:  Fracture involving the proximal fifth metacarpal, age indeterminate.  Moderate soft tissue swelling overlying the ulnar/dorsal aspect of the hand.  No radiopaque foreign body is seen.  **END ADDENDUM** SIGNED BY: Charline Bills, M.D.  11/10/2012   *RADIOLOGY REPORT*  Clinical Data: Cut wrist on tree, wrist/hand swelling  RIGHT HAND - COMPLETE 3+ VIEW  Comparison: None.  Findings: Fracture deformity involving the proximal fifth metacarpal, favored to be chronic.  The joint spaces are preserved.  Moderate soft tissue swelling overlying the dorsum of the hand.  No radiopaque foreign body is seen.  IMPRESSION: Suspected chronic fracture deformity involving the proximal fifth metacarpal.  Moderate soft tissue swelling overlying the dorsum of the hand.  No radiopaque foreign body is seen.   Original Report Authenticated By: Charline Bills, M.D.    Microbiology: No results found for this or any previous visit (from the past 240 hour(s)).   Labs: Basic Metabolic Panel:  Recent Labs Lab 11/10/12 1740 11/11/12 0520  NA 137 140  K 4.0 4.3  CL 97 103  CO2 24 26  GLUCOSE 122* 103*  BUN 10 13  CREATININE 0.89 0.83  CALCIUM 9.6 9.4   Liver Function Tests:  Recent Labs Lab 11/10/12 1740 11/11/12 0520  AST 32 18  ALT 24 18  ALKPHOS 42 33*  BILITOT 0.3 0.2*  PROT 7.8 6.3  ALBUMIN 4.2 3.4*   No results found for this basename: LIPASE, AMYLASE,  in the last 168 hours No results found for this basename: AMMONIA,  in the last 168 hours CBC:  Recent Labs Lab 11/10/12 1740 11/11/12 0520  WBC 8.4 8.0  HGB 18.2* 16.1  HCT 49.9 46.3   MCV 91.6 93.3  PLT 185 143*   Cardiac Enzymes: No results found for this basename: CKTOTAL, CKMB, CKMBINDEX, TROPONINI,  in the last 168 hours BNP: BNP (last 3 results)  Recent Labs  08/27/12 0655  PROBNP 101.8   CBG: No results found for this basename: GLUCAP,  in the last 168 hours     Signed:  Praneel Haisley  [facility] 11/12/2012, 1:39 PM

## 2012-11-12 NOTE — Care Management Note (Addendum)
   CARE MANAGEMENT NOTE 11/12/2012  Patient:  Brian [name], Brian [name]   Account Number:  [id number]  Date Initiated:  11/12/2012  Documentation initiated by:  Brian [name]  Subjective/Objective Assessment:   Pt for d/c will need followup.     Action/Plan:   Call placed to case management at [facility] re clinics in the [place] area. Call placed to [facility] and they do not treat ortho pain, call to [facility] and co pays seem excessive. Will discuss with pt.   Anticipated DC Date:  11/12/2012   Anticipated DC Plan:  HOME/SELF CARE         Choice offered to / List presented to:             Status of service:  In process, will continue to follow Medicare Important Message given?   (If response is "NO", the following Medicare IM given date fields will be blank) Date Medicare IM given:   Date Additional Medicare IM given:    Discharge Disposition:  HOME/SELF CARE  Per UR Regulation:    If discussed at Long Length of Stay Meetings, dates discussed:    Comments:  11/11/2012 Met with pt who will d/c to home of brother in [place], pt is not a [facility] resident, therefore not eligible for [facility]. This was varified with that agency. Spoke with pt who will be returning to [place] with his brother. CM also placed calls to [facility] re followup for this pt. [facility] does not see orthopedic injuries. [facility] is not convient as is the [facility]. Both clinics require proof of income and use a sliding scale charge system. This pt has no income. The most cost effective clinic would be the [facility] on [address]. Spoke with the pt re this clinic and the $20 copay. He asked this case manager to speak with his neice who would assist with transportation. I then spoke to pt niece , Brian [name] who agrees that the Wellness center would be the best option. She asked for the appointment  to be scheduled for a Friday due to her school schedule. This CM placed a call to the center and left a message with this information and asked the center to notify this pt niece of the appointment time. Pt is aware of these arrangements and was given an informations sheet re the center with a map and phone number. Brian Shock RN MPH Case manager [phone]  Follow up appointment: [facility] and [facility] [phone] November 22, 2013 at 11:30 am.

## 2012-11-15 LAB — COCAINE, URINE, CONFIRMATION: Benzoylecgonine GC/MS Conf: 1000 ng/mL — ABNORMAL HIGH

## 2012-11-15 LAB — OPIATE, QUANTITATIVE, URINE
Codeine Urine: NEGATIVE ng/mL
Hydromorphone GC/MS Conf: NEGATIVE ng/mL

## 2012-11-22 ENCOUNTER — Inpatient Hospital Stay: Payer: Self-pay

## 2012-11-25 ENCOUNTER — Emergency Department (HOSPITAL_COMMUNITY)
Admission: EM | Admit: 2012-11-25 | Discharge: 2012-11-26 | Disposition: A | Discharge status: Home / Self Care | Payer: Self-pay | Attending: Emergency Medicine | Admitting: Emergency Medicine

## 2012-11-25 ENCOUNTER — Encounter (HOSPITAL_COMMUNITY): Payer: Self-pay | Admitting: *Deleted

## 2012-11-25 DIAGNOSIS — F1410 Cocaine abuse, uncomplicated: Secondary | ICD-10-CM | POA: Insufficient documentation

## 2012-11-25 DIAGNOSIS — Z09 Encounter for follow-up examination after completed treatment for conditions other than malignant neoplasm: Secondary | ICD-10-CM | POA: Insufficient documentation

## 2012-11-25 DIAGNOSIS — M129 Arthropathy, unspecified: Secondary | ICD-10-CM | POA: Insufficient documentation

## 2012-11-25 DIAGNOSIS — Z8659 Personal history of other mental and behavioral disorders: Secondary | ICD-10-CM | POA: Insufficient documentation

## 2012-11-25 DIAGNOSIS — F1010 Alcohol abuse, uncomplicated: Secondary | ICD-10-CM | POA: Insufficient documentation

## 2012-11-25 DIAGNOSIS — Z5900 Homelessness unspecified: Secondary | ICD-10-CM | POA: Insufficient documentation

## 2012-11-25 DIAGNOSIS — F17200 Nicotine dependence, unspecified, uncomplicated: Secondary | ICD-10-CM | POA: Insufficient documentation

## 2012-11-25 DIAGNOSIS — J4489 Other specified chronic obstructive pulmonary disease: Secondary | ICD-10-CM | POA: Insufficient documentation

## 2012-11-25 DIAGNOSIS — S62308D Unspecified fracture of other metacarpal bone, subsequent encounter for fracture with routine healing: Secondary | ICD-10-CM

## 2012-11-25 DIAGNOSIS — J449 Chronic obstructive pulmonary disease, unspecified: Secondary | ICD-10-CM | POA: Insufficient documentation

## 2012-11-25 DIAGNOSIS — I509 Heart failure, unspecified: Secondary | ICD-10-CM | POA: Insufficient documentation

## 2012-11-25 NOTE — ED Provider Notes (Addendum)
CSN: [id number]     Arrival date & time 11/25/12  2327 History     First MD Initiated Contact with Patient 11/25/12 2338     Chief Complaint  Patient presents with  . Hand Pain   (Consider location/radiation/quality/duration/timing/severity/associated sxs/prior Treatment) HPI Comments: Patient was admitted to the hospital on 83 and discharged on 8522 a cellulitis of his hand.  He comes in tonight complaining that he has increased pain, asked why he did not followup.  Biotics.  He, states he was in jail, and was released from jail today. He did not take the antibiotic that he was discharged with  Patient is a 57 y.o. male presenting with hand pain. The history is provided by the patient.  Hand Pain This is a chronic problem. The current episode started 1 to 4 weeks ago. The problem occurs constantly. The problem has been unchanged. Associated symptoms include joint swelling. Pertinent negatives include no chills, fever or rash. Nothing aggravates the symptoms. He has tried nothing for the symptoms. The treatment provided no relief.    Past Medical History  Diagnosis Date  . Heart failure   . Arthritis   . Alcohol abuse     Heavy alcohol abuse and homelessness  . Tobacco abuse   . Cocaine abuse     And crack   . Homelessness   . COPD (chronic obstructive pulmonary disease)   . Suicide attempt   . CHF (congestive heart failure)    Past Surgical History  Procedure Laterality Date  . Inner ear surgery     Family History  Problem Relation Age of Onset  . Coronary artery disease    . Diabetes     History  Substance Use Topics  . Smoking status: Current Every Day Smoker -- 2.00 packs/day    Types: Cigarettes  . Smokeless tobacco: Never Used  . Alcohol Use: 100.8 oz/week    168 Cans of beer per week     Comment: Heavy. 5-10 40oz daily    Review of Systems  Constitutional: Negative for fever and chills.  Musculoskeletal: Positive for joint swelling.  Skin: Negative for  rash and wound.  All other systems reviewed and are negative.    Allergies  Review of patient's allergies indicates no known allergies.  Home Medications   Current Outpatient Rx  Name  Route  Sig  Dispense  Refill  . doxycycline (VIBRA-TABS) 100 MG tablet   Oral   Take 1 tablet (100 mg total) by mouth every 12 (twelve) hours.   12 tablet   0   . oxyCODONE-acetaminophen (PERCOCET) 10-325 MG per tablet   Oral   Take 1 tablet by mouth every 4 (four) hours as needed for pain.   30 tablet   0   . oxyCODONE-acetaminophen (PERCOCET/ROXICET) 5-325 MG per tablet   Oral   Take 1 tablet by mouth once.   10 tablet   0    BP 109/78  Pulse 100  Temp(Src) 98.5 F (36.9 C) (Oral)  Resp 18  SpO2 94% Physical Exam  Nursing note and vitals reviewed. Constitutional: He appears well-developed and well-nourished.  HENT:  Head: Normocephalic.  Neck: Normal range of motion.  Cardiovascular: Normal rate.   Pulmonary/Chest: Effort normal.  Musculoskeletal: He exhibits tenderness. He exhibits no edema.  There is swelling to the lateral aspect of the right hand, without erythema, or breaks in the skin.  X-ray reveals that he has a persistent, healing.  Carpal fracture, with a dislocation of  the fourth metacarpal.  That is been noted on previous x-rays you have the placed in a splint and instructed to followup with Dr. Melvyn Novas as previous, instructions that stated    ED Course   Procedures (including critical care time)  Labs Reviewed  CBC WITH DIFFERENTIAL - Abnormal; Notable for the following:    Hemoglobin 17.4 (*)    All other components within normal limits  POCT I-STAT, CHEM 8 - Abnormal; Notable for the following:    Glucose, Bld 102 (*)    Hemoglobin 18.7 (*)    HCT 55.0 (*)    All other components within normal limits   No results found. 1. Hand fracture - carpal bone, right, sequela   2. Closed fracture of 5th metacarpal, with routine healing, subsequent encounter      MDM   Patient was placed in a new cast.  Refill less than 3 seconds.  After catheter placed was encouraged to followup with Dr. Orlan Leavens.  This week Right fifth metacarpal fracture  Arman Filter, NP 11/26/12 0417  Arman Filter, NP 11/26/12 0417  Arman Filter, NP 11/26/12 0418  Arman Filter, NP 12/05/12 1610

## 2012-11-25 NOTE — ED Notes (Signed)
Per EMS: pt states that he was seen for a hand injury 3-4 weeks ago was suppose to follow up to see if he needed surgery. Pt homeless unable to follow up. Pt has soft cast on right hand.

## 2012-11-26 ENCOUNTER — Emergency Department (HOSPITAL_COMMUNITY): Payer: Self-pay

## 2012-11-26 ENCOUNTER — Telehealth (HOSPITAL_COMMUNITY): Payer: Self-pay | Admitting: Emergency Medicine

## 2012-11-26 LAB — POCT I-STAT, CHEM 8
BUN: 13 mg/dL (ref 6–23)
Calcium, Ion: 1.14 mmol/L (ref 1.12–1.23)
Chloride: 105 mEq/L (ref 96–112)
Potassium: 3.6 mEq/L (ref 3.5–5.1)

## 2012-11-26 LAB — CBC WITH DIFFERENTIAL/PLATELET
Basophils Absolute: 0 10*3/uL (ref 0.0–0.1)
HCT: 50.6 % (ref 39.0–52.0)
Hemoglobin: 17.4 g/dL — ABNORMAL HIGH (ref 13.0–17.0)
Lymphocytes Relative: 23 % (ref 12–46)
Monocytes Absolute: 0.7 10*3/uL (ref 0.1–1.0)
Neutro Abs: 6.7 10*3/uL (ref 1.7–7.7)
Neutrophils Relative %: 68 % (ref 43–77)
RDW: 13.6 % (ref 11.5–15.5)
WBC: 9.8 10*3/uL (ref 4.0–10.5)

## 2012-11-26 MED ORDER — KETOROLAC TROMETHAMINE 30 MG/ML IJ SOLN
30.0000 mg | Freq: Once | INTRAMUSCULAR | Status: AC
  Administered 2012-11-26: 30 mg via INTRAMUSCULAR
  Filled 2012-11-26: qty 1

## 2012-11-26 MED ORDER — OXYCODONE-ACETAMINOPHEN 5-325 MG PO TABS
1.0000 | ORAL_TABLET | Freq: Once | ORAL | Status: AC
  Administered 2012-11-26: 1 via ORAL
  Filled 2012-11-26: qty 1

## 2012-11-26 MED ORDER — OXYCODONE-ACETAMINOPHEN 5-325 MG PO TABS: 1.0000 | ORAL_TABLET | Freq: Once | ORAL | Status: DC

## 2012-11-26 NOTE — ED Provider Notes (Signed)
Medical screening examination/treatment/procedure(s) were performed by non-physician practitioner and as supervising physician I was immediately available for consultation/collaboration.   Candyce Churn, MD 11/26/12 [phone]

## 2012-11-26 NOTE — ED Notes (Signed)
Pharmacy calling for prescription instruction clarification for percocet.  Call transferred to D. Katrinka Blazing NP for clarification.

## 2012-12-06 NOTE — ED Provider Notes (Signed)
Medical screening examination/treatment/procedure(s) were performed by non-physician practitioner and as supervising physician I was immediately available for consultation/collaboration.   Candyce Churn, MD 12/06/12 1026

## 2012-12-21 ENCOUNTER — Emergency Department (HOSPITAL_COMMUNITY)
Admission: EM | Admit: 2012-12-21 | Discharge: 2012-12-22 | Disposition: A | Discharge status: Home / Self Care | Payer: PRIVATE HEALTH INSURANCE | Attending: Emergency Medicine | Admitting: Emergency Medicine

## 2012-12-21 ENCOUNTER — Emergency Department (HOSPITAL_COMMUNITY): Payer: PRIVATE HEALTH INSURANCE

## 2012-12-21 DIAGNOSIS — S62112A Displaced fracture of triquetrum [cuneiform] bone, left wrist, initial encounter for closed fracture: Secondary | ICD-10-CM

## 2012-12-21 DIAGNOSIS — Z8739 Personal history of other diseases of the musculoskeletal system and connective tissue: Secondary | ICD-10-CM | POA: Insufficient documentation

## 2012-12-21 DIAGNOSIS — S0990XA Unspecified injury of head, initial encounter: Secondary | ICD-10-CM | POA: Insufficient documentation

## 2012-12-21 DIAGNOSIS — F1410 Cocaine abuse, uncomplicated: Secondary | ICD-10-CM | POA: Insufficient documentation

## 2012-12-21 DIAGNOSIS — F17200 Nicotine dependence, unspecified, uncomplicated: Secondary | ICD-10-CM | POA: Insufficient documentation

## 2012-12-21 DIAGNOSIS — S298XXA Other specified injuries of thorax, initial encounter: Secondary | ICD-10-CM | POA: Insufficient documentation

## 2012-12-21 DIAGNOSIS — Z5900 Homelessness unspecified: Secondary | ICD-10-CM | POA: Insufficient documentation

## 2012-12-21 DIAGNOSIS — J449 Chronic obstructive pulmonary disease, unspecified: Secondary | ICD-10-CM | POA: Insufficient documentation

## 2012-12-21 DIAGNOSIS — T7411XA Adult physical abuse, confirmed, initial encounter: Secondary | ICD-10-CM | POA: Insufficient documentation

## 2012-12-21 DIAGNOSIS — S62113A Displaced fracture of triquetrum [cuneiform] bone, unspecified wrist, initial encounter for closed fracture: Secondary | ICD-10-CM | POA: Insufficient documentation

## 2012-12-21 DIAGNOSIS — F1010 Alcohol abuse, uncomplicated: Secondary | ICD-10-CM | POA: Insufficient documentation

## 2012-12-21 DIAGNOSIS — J4489 Other specified chronic obstructive pulmonary disease: Secondary | ICD-10-CM | POA: Insufficient documentation

## 2012-12-21 DIAGNOSIS — F10929 Alcohol use, unspecified with intoxication, unspecified: Secondary | ICD-10-CM

## 2012-12-21 DIAGNOSIS — I509 Heart failure, unspecified: Secondary | ICD-10-CM | POA: Insufficient documentation

## 2012-12-21 MED ORDER — IPRATROPIUM BROMIDE 0.02 % IN SOLN
0.5000 mg | Freq: Once | RESPIRATORY_TRACT | Status: AC
  Administered 2012-12-22: 0.5 mg via RESPIRATORY_TRACT
  Filled 2012-12-21: qty 2.5

## 2012-12-21 MED ORDER — ALBUTEROL SULFATE (5 MG/ML) 0.5% IN NEBU
5.0000 mg | INHALATION_SOLUTION | Freq: Once | RESPIRATORY_TRACT | Status: AC
  Administered 2012-12-22: 5 mg via RESPIRATORY_TRACT
  Filled 2012-12-21: qty 1

## 2012-12-21 MED ORDER — IPRATROPIUM BROMIDE 0.02 % IN SOLN
RESPIRATORY_TRACT | Status: AC
  Filled 2012-12-21: qty 2.5
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
## 2012-12-21 NOTE — ED Notes (Signed)
Patient transported to X-ray 
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
## 2012-12-22 ENCOUNTER — Emergency Department (HOSPITAL_COMMUNITY): Payer: PRIVATE HEALTH INSURANCE

## 2012-12-22 ENCOUNTER — Encounter (HOSPITAL_COMMUNITY): Payer: Self-pay | Admitting: Radiology

## 2012-12-22 LAB — CBC WITH DIFFERENTIAL/PLATELET
HCT: 47.2 % (ref 39.0–52.0)
Hemoglobin: 15.4 g/dL (ref 13.0–17.0)
Lymphocytes Relative: 32 % (ref 12–46)
Lymphs Abs: 2.5 10*3/uL (ref 0.7–4.0)
MCHC: 32.6 g/dL (ref 30.0–36.0)
Monocytes Absolute: 0.7 10*3/uL (ref 0.1–1.0)
Monocytes Relative: 10 % (ref 3–12)
Neutro Abs: 4.4 10*3/uL (ref 1.7–7.7)
Neutrophils Relative %: 57 % (ref 43–77)
RBC: 4.81 MIL/uL (ref 4.22–5.81)
WBC: 7.7 10*3/uL (ref 4.0–10.5)

## 2012-12-22 LAB — ETHANOL: Alcohol, Ethyl (B): 246 mg/dL — ABNORMAL HIGH (ref 0–11)

## 2012-12-22 LAB — POCT I-STAT TROPONIN I: Troponin i, poc: 0 ng/mL (ref 0.00–0.08)

## 2012-12-22 MED ORDER — SODIUM CHLORIDE 0.9 % IV SOLN
Freq: Once | INTRAVENOUS | Status: AC
  Administered 2012-12-22: 1000 mL via INTRAVENOUS

## 2012-12-22 MED ORDER — NAPROXEN 500 MG PO TABS: 500.0000 mg | ORAL_TABLET | Freq: Two times a day (BID) | ORAL | Status: DC
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
## 2012-12-22 NOTE — ED Notes (Signed)
Breakfast ordered for pt.
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
## 2012-12-22 NOTE — ED Provider Notes (Signed)
Medical screening examination/treatment/procedure(s) were performed by non-physician practitioner and as supervising physician I was immediately available for consultation/collaboration.  Brandin Stetzer L Michelena Culmer, MD 12/22/12 0100 

## 2012-12-22 NOTE — ED Provider Notes (Signed)
Medical screening examination/treatment/procedure(s) were performed by non-physician practitioner and as supervising physician I was immediately available for consultation/collaboration.   Darlys Gales, MD 12/22/12 1034

## 2012-12-22 NOTE — ED Provider Notes (Signed)
Medical screening examination/treatment/procedure(s) were performed by non-physician practitioner and as supervising physician I was immediately available for consultation/collaboration.   Darlys Gales, MD 12/22/12 [phone]

## 2013-02-02 ENCOUNTER — Emergency Department (HOSPITAL_COMMUNITY)
Admission: EM | Admit: 2013-02-02 | Discharge: 2013-02-03 | Disposition: A | Discharge status: Other Acute Inpatient Hospital | Payer: Self-pay | Attending: Emergency Medicine | Admitting: Emergency Medicine

## 2013-02-02 ENCOUNTER — Encounter (HOSPITAL_COMMUNITY): Payer: Self-pay | Admitting: Emergency Medicine

## 2013-02-02 DIAGNOSIS — F1410 Cocaine abuse, uncomplicated: Secondary | ICD-10-CM | POA: Insufficient documentation

## 2013-02-02 DIAGNOSIS — F1010 Alcohol abuse, uncomplicated: Secondary | ICD-10-CM | POA: Insufficient documentation

## 2013-02-02 DIAGNOSIS — F17200 Nicotine dependence, unspecified, uncomplicated: Secondary | ICD-10-CM | POA: Insufficient documentation

## 2013-02-02 DIAGNOSIS — F1020 Alcohol dependence, uncomplicated: Secondary | ICD-10-CM | POA: Diagnosis present

## 2013-02-02 DIAGNOSIS — J4489 Other specified chronic obstructive pulmonary disease: Secondary | ICD-10-CM | POA: Insufficient documentation

## 2013-02-02 DIAGNOSIS — J449 Chronic obstructive pulmonary disease, unspecified: Secondary | ICD-10-CM | POA: Insufficient documentation

## 2013-02-02 DIAGNOSIS — F1920 Other psychoactive substance dependence, uncomplicated: Secondary | ICD-10-CM | POA: Diagnosis present

## 2013-02-02 DIAGNOSIS — M129 Arthropathy, unspecified: Secondary | ICD-10-CM | POA: Insufficient documentation

## 2013-02-02 DIAGNOSIS — F10929 Alcohol use, unspecified with intoxication, unspecified: Secondary | ICD-10-CM | POA: Diagnosis present

## 2013-02-02 NOTE — ED Notes (Signed)
Pt states wants detox from etoh and crack cocaine. Drank beer all day and smoked crack. Denies SI or HI

## 2013-02-03 ENCOUNTER — Encounter (HOSPITAL_COMMUNITY): Payer: Self-pay | Admitting: Psychiatry

## 2013-02-03 ENCOUNTER — Encounter (HOSPITAL_COMMUNITY): Payer: Self-pay | Admitting: Intensive Care

## 2013-02-03 ENCOUNTER — Inpatient Hospital Stay (HOSPITAL_COMMUNITY)
Admission: AD | Admit: 2013-02-03 | Discharge: 2013-02-06 | DRG: 897 | Disposition: A | Discharge status: Home / Self Care | Payer: Self-pay | Source: Intra-hospital | Attending: Psychiatry | Admitting: Psychiatry

## 2013-02-03 DIAGNOSIS — Z5900 Homelessness unspecified: Secondary | ICD-10-CM

## 2013-02-03 DIAGNOSIS — F10939 Alcohol use, unspecified with withdrawal, unspecified: Principal | ICD-10-CM | POA: Diagnosis present

## 2013-02-03 DIAGNOSIS — M109 Gout, unspecified: Secondary | ICD-10-CM

## 2013-02-03 DIAGNOSIS — F10929 Alcohol use, unspecified with intoxication, unspecified: Secondary | ICD-10-CM

## 2013-02-03 DIAGNOSIS — F1410 Cocaine abuse, uncomplicated: Secondary | ICD-10-CM

## 2013-02-03 DIAGNOSIS — F1020 Alcohol dependence, uncomplicated: Secondary | ICD-10-CM | POA: Diagnosis present

## 2013-02-03 DIAGNOSIS — R079 Chest pain, unspecified: Secondary | ICD-10-CM

## 2013-02-03 DIAGNOSIS — I509 Heart failure, unspecified: Secondary | ICD-10-CM | POA: Diagnosis present

## 2013-02-03 DIAGNOSIS — F1994 Other psychoactive substance use, unspecified with psychoactive substance-induced mood disorder: Secondary | ICD-10-CM | POA: Diagnosis present

## 2013-02-03 DIAGNOSIS — F1420 Cocaine dependence, uncomplicated: Secondary | ICD-10-CM | POA: Diagnosis present

## 2013-02-03 DIAGNOSIS — F1010 Alcohol abuse, uncomplicated: Secondary | ICD-10-CM

## 2013-02-03 DIAGNOSIS — S62308P Unspecified fracture of other metacarpal bone, subsequent encounter for fracture with malunion: Secondary | ICD-10-CM

## 2013-02-03 DIAGNOSIS — Z79899 Other long term (current) drug therapy: Secondary | ICD-10-CM

## 2013-02-03 DIAGNOSIS — F10239 Alcohol dependence with withdrawal, unspecified: Principal | ICD-10-CM

## 2013-02-03 DIAGNOSIS — J449 Chronic obstructive pulmonary disease, unspecified: Secondary | ICD-10-CM | POA: Diagnosis present

## 2013-02-03 DIAGNOSIS — I472 Ventricular tachycardia: Secondary | ICD-10-CM

## 2013-02-03 DIAGNOSIS — L03113 Cellulitis of right upper limb: Secondary | ICD-10-CM

## 2013-02-03 DIAGNOSIS — Z5987 Material hardship due to limited financial resources, not elsewhere classified: Secondary | ICD-10-CM

## 2013-02-03 DIAGNOSIS — F1920 Other psychoactive substance dependence, uncomplicated: Secondary | ICD-10-CM

## 2013-02-03 DIAGNOSIS — Z598 Other problems related to housing and economic circumstances: Secondary | ICD-10-CM

## 2013-02-03 DIAGNOSIS — J4489 Other specified chronic obstructive pulmonary disease: Secondary | ICD-10-CM | POA: Diagnosis present

## 2013-02-03 LAB — COMPREHENSIVE METABOLIC PANEL
AST: 23 U/L (ref 0–37)
Albumin: 4.2 g/dL (ref 3.5–5.2)
Calcium: 10 mg/dL (ref 8.4–10.5)
Creatinine, Ser: 0.73 mg/dL (ref 0.50–1.35)

## 2013-02-03 LAB — CBC
MCH: 33.2 pg (ref 26.0–34.0)
MCHC: 35.4 g/dL (ref 30.0–36.0)
MCV: 93.7 fL (ref 78.0–100.0)
Platelets: 88 10*3/uL — ABNORMAL LOW (ref 150–400)
RDW: 13.4 % (ref 11.5–15.5)

## 2013-02-03 LAB — RAPID URINE DRUG SCREEN, HOSP PERFORMED
Amphetamines: NOT DETECTED
Benzodiazepines: NOT DETECTED
Opiates: NOT DETECTED

## 2013-02-03 LAB — ETHANOL: Alcohol, Ethyl (B): <11 mg/dL (ref 0–11)

## 2013-02-03 LAB — SALICYLATE LEVEL: Salicylate Lvl: <2 mg/dL — ABNORMAL LOW (ref 2.8–20.0)

## 2013-02-03 MED ORDER — METHOCARBAMOL 500 MG PO TABS
500.0000 mg | ORAL_TABLET | Freq: Three times a day (TID) | ORAL | Status: DC | PRN
  Administered 2013-02-03: 500 mg via ORAL
  Filled 2013-02-03: qty 1

## 2013-02-03 MED ORDER — THIAMINE HCL 100 MG/ML IJ SOLN: 100.0000 mg | Freq: Every day | INTRAMUSCULAR | Status: DC

## 2013-02-03 MED ORDER — LORAZEPAM 1 MG PO TABS
0.0000 mg | ORAL_TABLET | Freq: Four times a day (QID) | ORAL | Status: DC
  Administered 2013-02-03: 1 mg via ORAL
  Filled 2013-02-03: qty 1

## 2013-02-03 MED ORDER — HYDROXYZINE HCL 25 MG PO TABS
25.0000 mg | ORAL_TABLET | Freq: Four times a day (QID) | ORAL | Status: DC | PRN
  Filled 2013-02-03: qty 20

## 2013-02-03 MED ORDER — CLONIDINE HCL 0.1 MG PO TABS
0.1000 mg | ORAL_TABLET | Freq: Four times a day (QID) | ORAL | Status: AC
  Administered 2013-02-03 – 2013-02-06 (×10): 0.1 mg via ORAL
  Filled 2013-02-03 (×13): qty 1

## 2013-02-03 MED ORDER — VITAMIN B-1 100 MG PO TABS: 100.0000 mg | ORAL_TABLET | Freq: Every day | ORAL | Status: DC

## 2013-02-03 MED ORDER — ACETAMINOPHEN 325 MG PO TABS
650.0000 mg | ORAL_TABLET | Freq: Four times a day (QID) | ORAL | Status: DC | PRN
  Administered 2013-02-04 – 2013-02-05 (×4): 650 mg via ORAL
  Filled 2013-02-03 (×4): qty 2

## 2013-02-03 MED ORDER — ALUM & MAG HYDROXIDE-SIMETH 200-200-20 MG/5ML PO SUSP
30.0000 mL | ORAL | Status: DC | PRN
  Administered 2013-02-05: 30 mL via ORAL

## 2013-02-03 MED ORDER — CLONIDINE HCL 0.1 MG PO TABS
0.1000 mg | ORAL_TABLET | ORAL | Status: DC
  Filled 2013-02-03 (×4): qty 1

## 2013-02-03 MED ORDER — DICYCLOMINE HCL 20 MG PO TABS: 20.0000 mg | ORAL_TABLET | Freq: Four times a day (QID) | ORAL | Status: DC | PRN

## 2013-02-03 MED ORDER — CLONIDINE HCL 0.1 MG PO TABS
0.1000 mg | ORAL_TABLET | Freq: Every day | ORAL | Status: DC
  Filled 2013-02-03: qty 1

## 2013-02-03 MED ORDER — CHLORDIAZEPOXIDE HCL 25 MG PO CAPS
25.0000 mg | ORAL_CAPSULE | Freq: Four times a day (QID) | ORAL | Status: DC | PRN
  Administered 2013-02-03 – 2013-02-05 (×4): 25 mg via ORAL
  Filled 2013-02-03 (×4): qty 1

## 2013-02-03 MED ORDER — HYDROXYZINE HCL 25 MG PO TABS: 25.0000 mg | ORAL_TABLET | Freq: Four times a day (QID) | ORAL | Status: DC | PRN

## 2013-02-03 MED ORDER — NICOTINE 21 MG/24HR TD PT24
21.0000 mg | MEDICATED_PATCH | Freq: Every day | TRANSDERMAL | Status: DC
  Administered 2013-02-04 – 2013-02-06 (×3): 21 mg via TRANSDERMAL
  Filled 2013-02-03 (×8): qty 1

## 2013-02-03 MED ORDER — LOPERAMIDE HCL 2 MG PO CAPS: 2.0000 mg | ORAL_CAPSULE | ORAL | Status: DC | PRN

## 2013-02-03 MED ORDER — NAPROXEN 500 MG PO TABS
500.0000 mg | ORAL_TABLET | Freq: Two times a day (BID) | ORAL | Status: DC | PRN
  Administered 2013-02-04: 500 mg via ORAL
  Filled 2013-02-03: qty 1

## 2013-02-03 MED ORDER — ONDANSETRON 4 MG PO TBDP: 4.0000 mg | ORAL_TABLET | Freq: Four times a day (QID) | ORAL | Status: DC | PRN

## 2013-02-03 MED ORDER — MAGNESIUM HYDROXIDE 400 MG/5ML PO SUSP: 30.0000 mL | Freq: Every day | ORAL | Status: DC | PRN

## 2013-02-03 MED ORDER — ADULT MULTIVITAMIN W/MINERALS CH
1.0000 | ORAL_TABLET | Freq: Every day | ORAL | Status: DC
  Administered 2013-02-03 – 2013-02-06 (×4): 1 via ORAL
  Filled 2013-02-03 (×8): qty 1

## 2013-02-03 MED ORDER — LORAZEPAM 1 MG PO TABS: 0.0000 mg | ORAL_TABLET | Freq: Two times a day (BID) | ORAL | Status: DC

## 2013-02-03 NOTE — ED Notes (Signed)
Pt is awake and alert, pleasant and cooperative. Patient denies HI, SI AH or VH. Discharge vitals 113/97 HR 108 RR 16 and unlabored. Pt has inpatient treatment scheduled at  [facility]. Will continue to monitor for safety. Patient escorted to  [facility] by [facility] transport lobby without incident. T.Melvyn Neth RN

## 2013-02-03 NOTE — Progress Notes (Signed)
Received phone call from Ald[place] at [facility] [phone]) who stated that patient has been declined due to high risk. Rodman Pickle, MHT

## 2013-02-03 NOTE — ED Notes (Signed)
Unable to locate belongings per previous RN prior to bringing back to psych ED. Pt pleasant and cooperative with staff. Pt denies SI.

## 2013-02-03 NOTE — Tx Team (Signed)
Initial Interdisciplinary Treatment Plan  PATIENT STRENGTHS: (choose at least two) Ability for insight Active sense of humor Average or above average intelligence Communication skills Supportive family/friends  PATIENT STRESSORS: Financial difficulties Health problems Occupational concerns Substance abuse   PROBLEM LIST: Problem List/Patient Goals Date to be addressed Date deferred Reason deferred Estimated date of resolution  ETOH Abuse 02/03/13                                                      DISCHARGE CRITERIA:  Ability to meet basic life and health needs Adequate post-discharge living arrangements Improved stabilization in mood, thinking, and/or behavior Medical problems require only outpatient monitoring  PRELIMINARY DISCHARGE PLAN: Attend aftercare/continuing care group Outpatient therapy  PATIENT/FAMIILY INVOLVEMENT: This treatment plan has been presented to and reviewed with the patient, Brian [name].  The patient has been given the opportunity to ask questions and make suggestions.  Nestor Ramp [facility] 02/03/2013, 3:34 PM

## 2013-02-03 NOTE — ED Provider Notes (Signed)
CSN: [id number]     Arrival date & time 02/02/13  2243 History   First MD Initiated Contact with Patient 02/03/13 0007     Chief Complaint  Patient presents with  . Medical Clearance   HPI  History provided by the patient. Patient is a 57 year old male with history of alcohol abuse, cocaine abuse, COPD and CHF who presents with requests for help with alcohol and drug addiction. Patient reports having several beers all day long as well as smoking crack cocaine. Patient states that he is tired of his life like this and wishes to have help with detox so he can straighten up his life. He has no other complaints. He denies any significant depression, SI or HI. Denies any hallucinations. Denies any associated chest pain, shortness of breath or palpitations. No recent illnesses. No fever, chills or sweats.    Past Medical History  Diagnosis Date  . Heart failure   . Arthritis   . Alcohol abuse     Heavy alcohol abuse and homelessness  . Tobacco abuse   . Cocaine abuse     And crack   . Homelessness   . COPD (chronic obstructive pulmonary disease)   . Suicide attempt   . CHF (congestive heart failure)    Past Surgical History  Procedure Laterality Date  . Inner ear surgery     Family History  Problem Relation Age of Onset  . Coronary artery disease    . Diabetes     History  Substance Use Topics  . Smoking status: Current Every Day Smoker -- 2.00 packs/day    Types: Cigarettes  . Smokeless tobacco: Never Used  . Alcohol Use: 100.8 oz/week    168 Cans of beer per week     Comment: Heavy. 5-10 40oz daily    Review of Systems  Constitutional: Negative for fever, chills and diaphoresis.  Respiratory: Negative for shortness of breath.   Cardiovascular: Negative for chest pain.  Psychiatric/Behavioral: Negative for suicidal ideas and dysphoric mood.  All other systems reviewed and are negative.    Allergies  Review of patient's allergies indicates no known allergies.  Home  Medications  No current outpatient prescriptions on file. BP 129/75  Pulse 107  Temp(Src) 98.2 F (36.8 C) (Oral)  Resp 18  Ht 5\' 7"  (1.702 m)  Wt 200 lb (90.719 kg)  BMI 31.32 kg/m2  SpO2 91% Physical Exam  Nursing note and vitals reviewed. Constitutional: He is oriented to person, place, and time. He appears well-developed and well-nourished. No distress.  HENT:  Head: Normocephalic and atraumatic.  Eyes: Conjunctivae and EOM are normal. Pupils are equal, round, and reactive to light.  Cardiovascular: Normal rate and regular rhythm.   Pulmonary/Chest: Effort normal and breath sounds normal. No respiratory distress. He has no wheezes. He has no rales.  Abdominal: Soft.  Musculoskeletal: Normal range of motion.  Neurological: He is alert and oriented to person, place, and time. Gait normal.  Skin: Skin is warm.  Psychiatric: He has a normal mood and affect. His behavior is normal.    ED Course  Procedures   COORDINATION OF CARE:  Nursing notes reviewed. Vital signs reviewed. Initial pt interview and examination performed.   Patient seen and evaluated. Patient appears well in no acute distress. Denies SI or HI does wish to have detox from alcohol. Discussed work up plan with pt at bedside, which includes medical screening and TTS evaluation. Pt agrees with plan.  Psychiatric holding orders in place. Alcohol  detox orders in place. TTS consult ordered.  Results for orders placed during the hospital encounter of 02/02/13  ACETAMINOPHEN LEVEL      Result Value Range   Acetaminophen (Tylenol), Serum <15.0  10 - 30 ug/mL  CBC      Result Value Range   WBC 7.1  4.0 - 10.5 K/uL   RBC 5.27  4.22 - 5.81 MIL/uL   Hemoglobin 17.5 (*) 13.0 - 17.0 g/dL   HCT 16.1  09.6 - 04.5 %   MCV 93.7  78.0 - 100.0 fL   MCH 33.2  26.0 - 34.0 pg   MCHC 35.4  30.0 - 36.0 g/dL   RDW 40.9  81.1 - 91.4 %   Platelets 88 (*) 150 - 400 K/uL  COMPREHENSIVE METABOLIC PANEL      Result Value Range    Sodium 137  135 - 145 mEq/L   Potassium 4.0  3.5 - 5.1 mEq/L   Chloride 99  96 - 112 mEq/L   CO2 24  19 - 32 mEq/L   Glucose, Bld 78  70 - 99 mg/dL   BUN 9  6 - 23 mg/dL   Creatinine, Ser 7.82  0.50 - 1.35 mg/dL   Calcium 95.6  8.4 - 21.3 mg/dL   Total Protein 7.7  6.0 - 8.3 g/dL   Albumin 4.2  3.5 - 5.2 g/dL   AST 23  0 - 37 U/L   ALT 23  0 - 53 U/L   Alkaline Phosphatase 44  39 - 117 U/L   Total Bilirubin 0.2 (*) 0.3 - 1.2 mg/dL   GFR calc non Af Amer >90  >90 mL/min   GFR calc Af Amer >90  >90 mL/min  ETHANOL      Result Value Range   Alcohol, Ethyl (B) 255 (*) 0 - 11 mg/dL  SALICYLATE LEVEL      Result Value Range   Salicylate Lvl <2.0 (*) 2.8 - 20.0 mg/dL  URINE RAPID DRUG SCREEN (HOSP PERFORMED)      Result Value Range   Opiates NONE DETECTED  NONE DETECTED   Cocaine POSITIVE (*) NONE DETECTED   Benzodiazepines NONE DETECTED  NONE DETECTED   Amphetamines NONE DETECTED  NONE DETECTED   Tetrahydrocannabinol NONE DETECTED  NONE DETECTED   Barbiturates NONE DETECTED  NONE DETECTED      MDM   1. Alcohol abuse   2. Cocaine abuse   3. Alcohol intoxication   4. Polysubstance (excluding opioids) dependence, daily use        Angus Seller, [place] 02/03/13 [phone]

## 2013-02-03 NOTE — BHH Counselor (Signed)
Per Tanna Savoy, pt has been accepted by [facility] to bed 306[facility]1. Writer notified pt's RN Alcario Drought and NP Nanine Means. Support paperwork signed and faxed to [facility]. Originals placed in pt's chart.  Evette Cristal, [place] Assessment Counselor

## 2013-02-03 NOTE — Consult Note (Signed)
  Patient requests alcohol detox for daily alcohol, marijuana, and crack use.  He presents with a depressed mood and congruent affect, poor hygiene.  He will be admitted to the 300 hall at [facility] when a bed is available.  I have personally seen the patient and agreed with the findings and involved in the treatment plan. Kathryne Sharper, MD
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
## 2013-02-03 NOTE — Progress Notes (Signed)
[facility] called back stating, needs a new BAC level.  BAC level is too high for their facility at this time.  [facility] also requests to speak to the physician regarding pt CHF and the medical clearance.  Blain Pais, MHT/NS

## 2013-02-03 NOTE — BH Assessment (Signed)
Brian [name] from [facility] called regarding referral sent this am. Sts that their facility does not take [facility] residents and patient's. Pt's address on face sheet/demographics note that he is a [facility] resident.

## 2013-02-03 NOTE — Progress Notes (Signed)
Underwriter initiated faxing referral for inpatient detox on behalf of pt.  The following hospitals were contacted with bed availability: 1)[facility] 2)[facility] 3)[facility] 4)[facility] provided an ZOXW#[id number] good until 10/30.  Blain Pais, MHT/NS

## 2013-02-03 NOTE — Progress Notes (Signed)
D:  Patient asked about his belongings that did not come from the ED with him.  Property list on admission to [facility] indicates no belongings sent.   A:  Call placed to [facility] asking about belongings.  Last mention is not from [place] [facility] At 4:23 this morning.  Spoke with an employee who states that she has a Cabin crew and is looking into the whereabouts of his property.  She will call the unit back when she has some information.  Patient was informed of the progress we have up till now.   R:  Patient is agreeable to the plan at this time.  Will await response from ED.

## 2013-02-03 NOTE — Progress Notes (Signed)
Pt did not attend AA group  

## 2013-02-03 NOTE — Progress Notes (Signed)
Patient admitted to [facility]. Patient presents for alcohol detox. Patient admits to cocaine/crack, marijuana, and alcohol use. Patient states his last drink was yesterday, the 26th. When questioned about amount of alcohol consumed patient just states that it was "a lot." Patient currently denies SI/HI and auditory and visual hallucinations. The patient was given education regarding hospital rules and regulations. Patient was given education regarding fall risk status (low). Patient was escorted to unit by RN and MHT. Will continue to monitor patient for safety.

## 2013-02-03 NOTE — ED Provider Notes (Signed)
Medical screening examination/treatment/procedure(s) were performed by non-physician practitioner and as supervising physician I was immediately available for consultation/collaboration.  EKG Interpretation   None         Hanley Seamen, MD 02/03/13 [phone]

## 2013-02-03 NOTE — Progress Notes (Signed)
Contacted the following facilities to follow up on referrals sent:   [facility] - per Olegario Messier, did not receive referral, fax has been sent [facility]- per Herbert Seta no beds availible, expecting discharges in their ED, instructed to call back after 0830am [facility]- per [facility], no older adult beds available  Rodman Pickle, MHT

## 2013-02-04 DIAGNOSIS — F39 Unspecified mood [affective] disorder: Secondary | ICD-10-CM

## 2013-02-04 DIAGNOSIS — F1994 Other psychoactive substance use, unspecified with psychoactive substance-induced mood disorder: Secondary | ICD-10-CM | POA: Diagnosis present

## 2013-02-04 DIAGNOSIS — F10239 Alcohol dependence with withdrawal, unspecified: Principal | ICD-10-CM | POA: Diagnosis present
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
## 2013-02-04 NOTE — Progress Notes (Signed)
Psychoeducational Group Note  Date:  02/04/2013 Time:  1100  Group Topic/Focus:  Recovery Goals:   The focus of this group is to identify appropriate goals for recovery and establish a plan to achieve them.  Participation Level: Did Not Attend  Participation Quality:  Not Applicable  Affect:  Not Applicable  Cognitive:  Not Applicable  Insight:  Not Applicable  Engagement in Group: Not Applicable  Additional Comments:  Patient did not attend group, patient remained in bed.  [name] 02/04/2013, 6:56 PM

## 2013-02-04 NOTE — Progress Notes (Signed)
D: Patient denies SI/HI and A/V hallucinations; patient reports that he does not feel well and really would not go into details verbally except he reports nausea and agitation  A: Monitored q 15 minutes; patient encouraged to attend groups; patient educated about medications; patient given medications per physician orders; patient encouraged to express feelings and/or concerns  R: Patient has not had much of an appetite, patient is sweating, agitated, has a headache ; patient's interaction with staff and peers is appropriate; patient has been in the bed all day and blood pressure has decreased; patient is taking medications as prescribed and tolerating medications; patient is not attending any groups

## 2013-02-04 NOTE — [facility] Suicide Risk Assessment (Signed)
Suicide Risk Assessment  Admission Assessment     Nursing information obtained from:    Demographic factors:    Current Mental Status:    Loss Factors:    Historical Factors:    Risk Reduction Factors:     CLINICAL FACTORS:   Depression:   Comorbid alcohol abuse/dependence Alcohol/Substance Abuse/Dependencies  COGNITIVE FEATURES THAT CONTRIBUTE TO RISK:  Closed-mindedness Polarized thinking Thought constriction (tunnel vision)    SUICIDE RISK:   Moderate:  Frequent suicidal ideation with limited intensity, and duration, some specificity in terms of plans, no associated intent, good self-control, limited dysphoria/symptomatology, some risk factors present, and identifiable protective factors, including available and accessible social support.  PLAN OF CARE: Supportive approach/coping skills/relapse prevention                               Librium detox protocol                               Reassess and address the co morbidities                               Explore rehab options  I certify that inpatient services furnished can reasonably be expected to improve the patient's condition.  Brian [name] A 02/04/2013, 12:52 PM

## 2013-02-04 NOTE — Progress Notes (Signed)
Recreation Therapy Notes  Date: 10.28.2014 Time: 2:30pm Location: [address] Dayroom  Group Topic: Software engineer Activities (AAA)  Behavioral Response: Did not attend.  Marykay Lex Dyon Rotert, LRT/CTRS  Venida Tsukamoto L 02/04/2013 5:00 PM

## 2013-02-04 NOTE — Progress Notes (Signed)
Patient ID: Brian [name], male   DOB: 1955-11-24, 57 y.o.   MRN: [id number]  D: Patient presents with depressed mood and affect. Patient denies SI/HI and A/V hallucinations but does state that he, "wants a drink." Patient became irritable with Clinical research associate when Clinical research associate was assessing patient. Patient stated, "all I want is my damn clothes from over yonder" referring to his lost belongings at [facility]. Patient presents with very high BP and in need of medical intervention.   A: Support was given to patient and Clinical research associate explained as soon as information was given, the patient would be informed. Writer contacted P.A. and requested order for management of patient's BP.  R: Patient verbalized understanding and was cooperative with Clinical research associate. Q15 minute checks are maintained for safety. Patient was given Clonidine, Librium, and Robaxin for withdrawal symptoms (including increased BP). Writer will continue to monitor patient's BP.

## 2013-02-04 NOTE — Progress Notes (Signed)
Recreation Therapy Notes  Date: 10.28.2014 Time: 3:10pm Location: [address] Dayroom  Group Topic: Self-Esteem  Goal Area(s) Addresses:  Patient will identify positive ways to increase self-esteem. Patient will verbalize benefit of increased self-esteem. Patient will effectively relate healthy self-esteem to personal safety.   Behavioral Response: Did not attend.   Marykay Lex Neomi Laidler, LRT/CTRS  Devarious Pavek L 02/04/2013 5:15 PM

## 2013-02-04 NOTE — [facility] Group Notes (Signed)
[facility] LCSW Group Therapy  02/04/2013 3:22 PM  Type of Therapy:  Group Therapy  Participation Level:  Did Not Attend   [name], Brian Scherman 02/04/2013, 3:22 PM

## 2013-02-04 NOTE — Progress Notes (Signed)
Patient states he wants his clothes and he is ready to go home.  Patient irritable this morning stating his clothes were lost in the emergency room.  Writer called the psych ed in reference to the patients clothing and Lilian Kapur stated patient did not present to the psych ed with clothing but states patient was in the main ed to start with and the main ed has not located his clothing at this time.

## 2013-02-04 NOTE — Progress Notes (Signed)
The focus of this group is to educate the patient on the purpose and policies of crisis stabilization and provide a format to answer questions about their admission.  The group details unit policies and expectations of patients while admitted. Patient did not attend because they were not feeling well.

## 2013-02-05 DIAGNOSIS — F3289 Other specified depressive episodes: Secondary | ICD-10-CM

## 2013-02-05 DIAGNOSIS — F329 Major depressive disorder, single episode, unspecified: Secondary | ICD-10-CM

## 2013-02-05 DIAGNOSIS — F1010 Alcohol abuse, uncomplicated: Secondary | ICD-10-CM

## 2013-02-05 MED ORDER — IPRATROPIUM-ALBUTEROL 20-100 MCG/ACT IN AERS
1.0000 | INHALATION_SPRAY | Freq: Four times a day (QID) | RESPIRATORY_TRACT | Status: DC
  Administered 2013-02-05 – 2013-02-06 (×3): 1 via RESPIRATORY_TRACT
  Filled 2013-02-05 (×2): qty 4

## 2013-02-05 MED ORDER — QUETIAPINE FUMARATE 25 MG PO TABS
25.0000 mg | ORAL_TABLET | Freq: Two times a day (BID) | ORAL | Status: DC
  Administered 2013-02-05 – 2013-02-06 (×2): 25 mg via ORAL
  Filled 2013-02-05 (×4): qty 1
  Filled 2013-02-05 (×3): qty 28
  Filled 2013-02-05: qty 1
  Filled 2013-02-05: qty 28

## 2013-02-05 MED ORDER — HYDROCHLOROTHIAZIDE 25 MG PO TABS
25.0000 mg | ORAL_TABLET | Freq: Every day | ORAL | Status: DC
  Administered 2013-02-05 – 2013-02-06 (×2): 25 mg via ORAL
  Filled 2013-02-05 (×8): qty 1

## 2013-02-05 MED ORDER — IBUPROFEN 800 MG PO TABS: 800.0000 mg | ORAL_TABLET | Freq: Three times a day (TID) | ORAL | Status: DC | PRN

## 2013-02-05 MED ORDER — ALBUTEROL SULFATE (5 MG/ML) 0.5% IN NEBU
2.5000 mg | INHALATION_SOLUTION | RESPIRATORY_TRACT | Status: DC | PRN
  Filled 2013-02-05: qty 0.5

## 2013-02-05 MED ORDER — QUETIAPINE FUMARATE 100 MG PO TABS
100.0000 mg | ORAL_TABLET | Freq: Every day | ORAL | Status: DC
  Administered 2013-02-05: 100 mg via ORAL
  Filled 2013-02-05 (×2): qty 14
  Filled 2013-02-05 (×2): qty 1

## 2013-02-05 MED ORDER — GUAIFENESIN ER 600 MG PO TB12: 600.0000 mg | ORAL_TABLET | Freq: Two times a day (BID) | ORAL | Status: DC | PRN

## 2013-02-05 NOTE — [facility] Group Notes (Signed)
[facility] LCSW Aftercare Discharge Planning Group Note   02/05/2013  8:45 AM  Participation Quality:  Did Not Attend  Reyes Ivan, LCSW 02/05/2013 9:57 AM

## 2013-02-05 NOTE — [facility] Counselor (Signed)
Adult Comprehensive Assessment  Patient ID: Brian [name], male   DOB: 11-28-1955, 57 y.o.   MRN: [id number]  Information Source: Information source: Patient  Current Stressors:  Educational / Learning stressors: 9th grade education Employment / Job issues: unemployed, doesn't know how to apply for disabilityq Family Relationships: no family support Surveyor, quantity / Lack of resources (include bankruptcy): no income Housing / Lack of housing: homeless Physical health (include injuries & life threatening diseases): N/A Social relationships: N/A Substance abuse: Substance abuse Bereavement / Loss: N/A  Living/Environment/Situation:  Living Arrangements: Other (Comment) Living conditions (as described by patient or guardian): Pt states that he homeless in [place]. Pt states that he has a tent and would never stay in the shelter.   How long has patient lived in current situation?: 10 years What is atmosphere in current home: Temporary  Family History:  Marital status: Divorced Divorced, when?: "a long time ago" What types of issues is patient dealing with in the relationship?: pt states that he couldn't stand her Additional relationship information: N/A Does patient have children?: Yes How many children?: 2 How is patient's relationship with their children?: Adult children, pt states that they ride by him on the street and wave hi, is the extent of their relationship  Childhood History:  By whom was/is the patient raised?: Both parents Additional childhood history information: Pt reports having a good childhood.  Description of patient's relationship with caregiver when they were a child: Pt states that he got along well with parents growing up.  Patient's description of current relationship with people who raised him/her: Both parents deceased.   Does patient have siblings?: Yes Number of Siblings: 4 Description of patient's current relationship with siblings: 3 deceased sisters, 1  living brother - decent relationship with brother Did patient suffer any verbal/emotional/physical/sexual abuse as a child?: No Did patient suffer from severe childhood neglect?: No Has patient ever been sexually abused/assaulted/raped as an adolescent or adult?: No Was the patient ever a victim of a crime or a disaster?: No Witnessed domestic violence?: No Has patient been effected by domestic violence as an adult?: No  Education:  Highest grade of school patient has completed: 9th grade Currently a student?: No Learning disability?: No  Employment/Work Situation:   Employment situation: Unemployed Patient's job has been impacted by current illness: No What is the longest time patient has a held a job?: 9 years 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, community Has patient ever been in the [employer]?: No Has patient ever served in Buyer, retail?: No  Financial Resources:   Surveyor, quantity resources: No income Does patient have a Lawyer or guardian?: No  Alcohol/Substance Abuse:   What has been your use of drugs/alcohol within the last 12 months?: Alcohol - "from the time the sun comes up and sun comes down", marijuana - 1-2 joint daily, crack cocaine - "all day" If attempted suicide, did drugs/alcohol play a role in this?: No Alcohol/Substance Abuse Treatment Hx: Past detox;Past Tx, Inpatient If yes, describe treatment: [facility] for detox 2 years ago, [facility] - inpatient years ago, [facility] inpatient years ago Has alcohol/substance abuse ever caused legal problems?: No  Social Support System:   Patient's Community Support System: None Describe Community Support System: Pt denies having any supports Type of faith/religion: None reported How does patient's faith help to cope with current illness?: N/A  Leisure/Recreation:   Leisure and Hobbies: like to paint  Strengths/Needs:   What things does the patient do  well?: painting In what areas does patient  struggle / problems for patient: Substance abuse  Discharge Plan:   Does patient have access to transportation?: Yes Will patient be returning to same living situation after discharge?: Yes Currently receiving community mental health services: No If no, would patient like referral for services when discharged?: Yes (What county?) [facility]) Does patient have financial barriers related to discharge medications?: No  Summary/Recommendations:     Patient is a 57 year old Caucasian Male with a diagnosis of Alcohol Dependence and Cocaine Abuse.  Patient is homeless in [place].  Pt was irritable and states that he would like to stop using but doesn't have intentions to at this time.  Patient will benefit from crisis stabilization, medication evaluation, group therapy and psycho education in addition to case management for discharge planning.    Brian [name], Brian Arnt. 02/05/2013

## 2013-02-05 NOTE — Progress Notes (Signed)
Patient ID: Brian [name], male   DOB: 1955/05/14, 57 y.o.   MRN: [id number]  D: Patient presents with depressed mood and affect. Patient is irritable with writer this evening in relation to his belongings missing from [facility] ED. PAtient denies SI/HI and A/V hallucinations. Patient's BP continues to remain high and was given PRN Librium. Patient complained of pain in his left hand and was given PRN Naproxen for pain relief.  Patient stated, "I want to see the head over the whole E.R and the head security at the E.R." Writer contacted the [facility]. and explained the patient's stated grievance. A.C advised Clinical research associate to Metallurgist via e-mail to make them aware of the situation.  A: Emotional support and encouragement given to patient by Clinical research associate. Writer explained to the patient that staff will continue to check on patient's lost items and will let him know whenever we have new information. Writer sent e-mail to both Merchandiser, retail. Patient received both PRN medications.  R: Patient still irritable with the situation but states that he understands it is not writer's fault or fault of [facility] but is frustrated with the situation. Patient is seen interacting pleasantly with other patients in the milieu this evening. Patient reported a slight decrease in pain in his hand but, "not much." Writer offered patient a another pain  intervention such as a hot or cold pack. Patient refused any other pain interventions. Q15 minute safety checks are maintained. Patient is safe at this time.

## 2013-02-05 NOTE — Progress Notes (Signed)
D: Patient denies SI/HI and A/V hallucinations; patient reports sleep was poor; reports appetite is good; patient reports that he is upset and patient is constantly talking about his lost items   A: Monitored q 15 minutes; patient encouraged to attend groups; patient educated about medications; patient given medications per physician orders; patient encouraged to express feelings and/or concerns  R: Patient is focused on his items being lost and the Chiropodist has spoken with him ; patient's interaction with staff and peers is appropriate and cooperative;  patient is taking medications as prescribed and tolerating medications; patient is attending some groups

## 2013-02-05 NOTE — Progress Notes (Signed)
[facility] MD Progress Note  02/05/2013 3:01 PM Brian [name]  MRN:  [id number] Subjective:  Patient complained of issues sleeping and he is also agitated/irritable--Seroquel started, upset about the ED losing his clothes---Assistant DON in the search process, complains of shakes at times from withdrawals, blood pressure elevated HCTZ added for this and his CHF diagnosis, appetite is "so-so" and depression is "yes and no", he contributes more to irritation, aggravation Diagnosis:   DSM5:  Substance/Addictive Disorders:  Alcohol Related Disorder - Severe (303.90) Depressive Disorders:  Major Depressive Disorder - Mild (296.21)  Axis I: Alcohol Abuse and Depressive Disorder NOS Axis II: Deferred Axis III:  Past Medical History  Diagnosis Date  . Heart failure   . Arthritis   . Alcohol abuse     Heavy alcohol abuse and homelessness  . Tobacco abuse   . Cocaine abuse     And crack   . Homelessness   . COPD (chronic obstructive pulmonary disease)   . Suicide attempt   . CHF (congestive heart failure)    Axis IV: economic problems, housing problems, occupational problems, other psychosocial or environmental problems, problems related to social environment and problems with primary support group Axis V: 41-50 serious symptoms  ADL's:  Intact  Sleep: Poor  Appetite:  Fair  Suicidal Ideation:  Denies  Homicidal Ideation:  Denies   Psychiatric Specialty Exam: Review of Systems  Constitutional: Negative.   HENT: Negative.   Eyes: Negative.   Respiratory: Negative.   Cardiovascular: Negative.   Gastrointestinal: Negative.   Genitourinary: Negative.   Musculoskeletal: Negative.   Skin: Negative.   Neurological: Negative.   Endo/Heme/Allergies: Negative.   Psychiatric/Behavioral: Positive for depression and substance abuse. The patient is nervous/anxious.     Blood pressure 154/93, pulse 84, temperature 97.9 F (36.6 C), temperature source Oral, resp. rate 20, height 5\' 6"   (1.676 m), weight 90.719 kg (200 lb), SpO2 94.00%.Body mass index is 32.3 kg/(m^2).  General Appearance: Casual  Eye Contact::  Fair  Speech:  Normal Rate  Volume:  Normal  Mood:  Anxious, Depressed and Irritable  Affect:  Congruent  Thought Process:  Coherent  Orientation:  Full (Time, Place, and Person)  Thought Content:  WDL  Suicidal Thoughts:  No  Homicidal Thoughts:  No  Memory:  Immediate;   Fair Recent;   Fair Remote;   Fair  Judgement:  Good  Insight:  Fair  Psychomotor Activity:  Decreased  Concentration:  Fair  Recall:  Fair  Akathisia:  No  Handed:  Right  AIMS (if indicated):     Assets:  Resilience  Sleep:  Number of Hours: 5.75   Current Medications: Current Facility-Administered Medications  Medication Dose Route Frequency Provider Last Rate Last Dose  . acetaminophen (TYLENOL) tablet 650 mg  650 mg Oral Q6H PRN Nanine Means, NP   650 mg at 02/05/13 0634  . alum & mag hydroxide-simeth (MAALOX/MYLANTA) 200-200-20 MG/5ML suspension 30 mL  30 mL Oral Q4H PRN Nanine Means, NP   30 mL at 02/05/13 0657  . chlordiazePOXIDE (LIBRIUM) capsule 25 mg  25 mg Oral Q6H PRN Nanine Means, NP   25 mg at 02/05/13 4098  . cloNIDine (CATAPRES) tablet 0.1 mg  0.1 mg Oral QID Kerry Hough, PA-C   0.1 mg at 02/05/13 1201   Followed by  . [START ON 02/06/2013] cloNIDine (CATAPRES) tablet 0.1 mg  0.1 mg Oral BH-qamhs Spencer E Simon, PA-C       Followed by  . [START ON  02/09/2013] cloNIDine (CATAPRES) tablet 0.1 mg  0.1 mg Oral QAC breakfast Kerry Hough, PA-C      . dicyclomine (BENTYL) tablet 20 mg  20 mg Oral Q6H PRN Kerry Hough, PA-C      . hydrochlorothiazide (HYDRODIURIL) tablet 25 mg  25 mg Oral Daily Nanine Means, NP      . hydrOXYzine (ATARAX/VISTARIL) tablet 25 mg  25 mg Oral Q6H PRN Nanine Means, NP      . hydrOXYzine (ATARAX/VISTARIL) tablet 25 mg  25 mg Oral Q6H PRN Kerry Hough, PA-C      . loperamide (IMODIUM) capsule 2-4 mg  2-4 mg Oral PRN Nanine Means, NP       . loperamide (IMODIUM) capsule 2-4 mg  2-4 mg Oral PRN Kerry Hough, PA-C      . magnesium hydroxide (MILK OF MAGNESIA) suspension 30 mL  30 mL Oral Daily PRN Nanine Means, NP      . methocarbamol (ROBAXIN) tablet 500 mg  500 mg Oral Q8H PRN Kerry Hough, PA-C   500 mg at 02/03/13 2317  . multivitamin with minerals tablet 1 tablet  1 tablet Oral Daily Nanine Means, NP   1 tablet at 02/05/13 1610  . naproxen (NAPROSYN) tablet 500 mg  500 mg Oral BID PRN Kerry Hough, PA-C   500 mg at 02/04/13 2012  . nicotine (NICODERM CQ - dosed in mg/24 hours) patch 21 mg  21 mg Transdermal Q0600 Rachael Fee, MD   21 mg at 02/05/13 9604  . ondansetron (ZOFRAN-ODT) disintegrating tablet 4 mg  4 mg Oral Q6H PRN Kerry Hough, PA-C      . QUEtiapine (SEROQUEL) tablet 100 mg  100 mg Oral QHS Nanine Means, NP      . QUEtiapine (SEROQUEL) tablet 25 mg  25 mg Oral BID Nanine Means, NP        Lab Results: No results found for this or any previous visit (from the past 48 hour(s)).  Physical Findings: AIMS: Facial and Oral Movements Muscles of Facial Expression: None, normal Lips and Perioral Area: None, normal Jaw: None, normal Tongue: None, normal,Extremity Movements Upper (arms, wrists, hands, fingers): None, normal Lower (legs, knees, ankles, toes): None, normal, Trunk Movements Neck, shoulders, hips: None, normal, Overall Severity Severity of abnormal movements (highest score from questions above): None, normal Incapacitation due to abnormal movements: None, normal Patient's awareness of abnormal movements (rate only patient's report): No Awareness, Dental Status Current problems with teeth and/or dentures?: No Does patient usually wear dentures?: No  CIWA:  CIWA-Ar Total: 0 COWS:     Treatment Plan Summary: Daily contact with patient to assess and evaluate symptoms and progress in treatment Medication management  Plan:  Review of chart, vital signs, medications, and  notes. 1-Individual and group therapy 2-Medication management for depression and anxiety:  Medications reviewed with the patient and Seroquel 25 mg BID and 100 mg started for agitation and sleep issues 3-Coping skills for depression, anxiety, and alcohol dependency 4-Continue crisis stabilization and management 5-Address health issues--monitoring vital signs, HCTZ ordered for elevated BPs 6-Treatment plan in progress to prevent relapse of depression, alcohol abuse, and anxiety   Medical Decision Making Problem Points:  Established problem, stable/improving (1) and Review of psycho-social stressors (1) Data Points:  Review of new medications or change in dosage (2)  I certify that inpatient services furnished can reasonably be expected to improve the patient's condition.   Nanine Means, PMH-NP 02/05/2013, 3:01 PM Agree with  assessment and plan Madie Reno A. Dub Mikes, M.D.

## 2013-02-05 NOTE — [facility] Group Notes (Signed)
[facility] LCSW Group Therapy  02/05/2013  1:15 PM   Type of Therapy:  Group Therapy  Participation Level:  Did Not Attend - pt was irritable and instead of going to the correct group room said he'd back to his room.   Reyes Ivan, LCSW 02/05/2013 2:31 PM

## 2013-02-05 NOTE — [facility] Suicide Risk Assessment (Signed)
[facility] INPATIENT: Family/Significant Other Suicide Prevention Education  Suicide Prevention Education:  Education Completed; No one has been identified by the patient as the family member/significant other with whom the patient will be residing, and identified as the person(s) who will aid the patient in the event of a mental health crisis (suicidal ideations/suicide attempt). With written consent from the patient, the family member/significant other has been provided the following suicide prevention education, prior to the and/or following the discharge of the patient.  The suicide prevention education provided includes the following:  Suicide risk factors  Suicide prevention and interventions  National Suicide Hotline telephone number  [facility] assessment telephone number  [facility] Emergency Assistance 911  [facility] and/or Residential Mobile Crisis Unit telephone number Request made of family/significant other to:  Remove weapons (e.g., guns, rifles, knives), all items previously/currently identified as safety concern.  Remove drugs/medications (over-the-counter, prescriptions, illicit drugs), all items previously/currently identified as a safety concern. The family member/significant other verbalizes understanding of the suicide prevention education information provided. The family member/significant other agrees to remove the items of safety concern listed above. Pt did not c/o SI at admission, nor have they endorsed SI during their stay here. SPE not required.   Brian Ivan, LCSW 02/05/2013  11:23 AM

## 2013-02-05 NOTE — Progress Notes (Signed)
Adult Psychoeducational Group Note  Date:  02/05/2013 Time:  6:55 PM  Group Topic/Focus:  Personal Choices and Values:   The focus of this group is to help patients assess and explore the importance of values in their lives, how their values affect their decisions, how they express their values and what opposes their expression.  Participation Level:  None  Participation Quality:  Resistant  Affect:  Not Congruent  Cognitive:  Appropriate  Insight: None  Engagement in Group:  None  Modes of Intervention:  Discussion, Education, Socialization and Support  Additional Comments:  Brian [name] attended group and did not share during group. Patient was reserved and did not complete workbook that was given to patient to follow along in group.  [name] 02/05/2013, 6:55 PM

## 2013-02-05 NOTE — Tx Team (Signed)
Interdisciplinary Treatment Plan Update (Adult)  Date: 02/05/2013  Time Reviewed:  9:45 AM  Progress in Treatment: Attending groups: Yes Participating in groups:  Yes Taking medication as prescribed:  Yes Tolerating medication:  Yes Family/Significant othe contact made: CSW assessing Patient understands diagnosis:  Yes Discussing patient identified problems/goals with staff:  Yes Medical problems stabilized or resolved:  Yes Denies suicidal/homicidal ideation: Yes Issues/concerns per patient self-inventory:  Yes Other:  New problem(s) identified: N/A  Discharge Plan or Barriers: CSW assessing for appropriate referrals.    Reason for Continuation of Hospitalization: Anxiety Depression Medication Stabilization  Comments: N/A  Estimated length of stay: 3-5 days  For review of initial/current patient goals, please see plan of care.  Attendees: Patient:     Family:     Physician:   02/05/2013 10:40 AM   Nursing:   Roswell Miners, RN 02/05/2013 10:40 AM   Clinical Social Worker:  Reyes Ivan, LCSW 02/05/2013 10:40 AM   Other: Onnie Boer, RN case manager 02/05/2013 10:40 AM   Other:  Trula Slade, LCSWA 02/05/2013 10:40 AM   Other:  Burnetta Sabin, RN 02/05/2013 10:40 AM   Other:  Nanine Means, NP 02/05/2013 10:41 AM   Other:    Other:    Other:    Other:    Other:    Other:     Scribe for Treatment Team:   Carmina Miller, 02/05/2013 , 10:40 AM

## 2013-02-06 DIAGNOSIS — F1994 Other psychoactive substance use, unspecified with psychoactive substance-induced mood disorder: Secondary | ICD-10-CM

## 2013-02-06 MED ORDER — HYDROCHLOROTHIAZIDE 25 MG PO TABS: 25.0000 mg | ORAL_TABLET | Freq: Every day | ORAL | Status: DC

## 2013-02-06 MED ORDER — HYDROXYZINE HCL 25 MG PO TABS: 25.0000 mg | ORAL_TABLET | Freq: Four times a day (QID) | ORAL | Status: DC | PRN

## 2013-02-06 MED ORDER — QUETIAPINE FUMARATE 100 MG PO TABS: 100.0000 mg | ORAL_TABLET | Freq: Every day | ORAL | Status: DC

## 2013-02-06 MED ORDER — QUETIAPINE FUMARATE 25 MG PO TABS: 25.0000 mg | ORAL_TABLET | Freq: Two times a day (BID) | ORAL | Status: DC

## 2013-02-06 NOTE — [facility] Group Notes (Signed)
[facility] LCSW Group Therapy  02/06/2013 3:39 PM  Type of Therapy:  Group Therapy  Participation Level:  Did Not Attend   Smart, Herbert Seta 02/06/2013, 3:39 PM

## 2013-02-06 NOTE — Progress Notes (Signed)
D:Pt is irritable this morning requesting to see the MD and demanding to leave the hospital. "I want to leave, get my check and get drunk." A:Offered support and redirection. Gave medication as ordered. R:Pt denies si and hi. Pt denies detox symptoms .Safety maintained on the unit.

## 2013-02-06 NOTE — [facility] Suicide Risk Assessment (Signed)
Suicide Risk Assessment  Discharge Assessment     Demographic Factors:  Male, Caucasian and Unemployed  Mental Status Per Nursing Assessment::   On Admission:     Current Mental Status by Physician: In full contact with reality. There are no active S/S of withdrawal. There are no suicidal ideas, plans or intent. His mood is upset because of his belongings having been lost at the ED, his affect is worried. Describes how hard it is going to be to replace his personal documents  ID etc.that were on his pant pockets, as well as the "heavy coat" he has bought for the winter. Denies that this would make him go back to drink   Loss Factors: NA  Historical Factors: NA  Risk Reduction Factors:   wants to do better  Continued Clinical Symptoms:  Alcohol/Substance Abuse/Dependencies  Cognitive Features That Contribute To Risk:  Closed-mindedness Polarized thinking Thought constriction (tunnel vision)    Suicide Risk:  Minimal: No identifiable suicidal ideation.  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 Heart failure   . Arthritis   . Alcohol abuse     Heavy alcohol abuse and homelessness  . Tobacco abuse   . Cocaine abuse     And crack   . Homelessness   . COPD (chronic obstructive pulmonary disease)   . Suicide attempt   . CHF (congestive heart failure)    AXIS IV:  economic problems, housing problems and other psychosocial or environmental problems AXIS V:  51-60 moderate symptoms  Plan Of Care/Follow-up recommendations:  Activity:  as tolerated Diet:  regular Follow up outpatient basis Work the relapse prevention plan Is patient on multiple  antipsychotic therapies at discharge:  No   Has Patient had three or more failed trials of antipsychotic monotherapy by history:  No  Recommended Plan for Multiple Antipsychotic Therapies: NA  Adolphe Fortunato A 02/06/2013, 1:14 PM

## 2013-02-06 NOTE — Consult Note (Signed)
Agree with plan 

## 2013-02-06 NOTE — Progress Notes (Signed)
Patient ID: Brian [name], male   DOB: 16-Oct-1955, 57 y.o.   MRN: [id number]  D: Patient presents with depressed mood and affect. Patient denies SI/HI and A/V hallucinations. Patient denies withdrawal symptoms other than mild sweating, "on and off." Patient's BP remains elevated. Patient c/o pain in his hand and requests medication. Patient requested a pair of shoes, "since I have none." Patient stated that he was having a, "hacking cough" and wanted something to take care of it while he was here.    A: Writer gave support and encouragement to patient to attend groups and speak to writer if patient needs anything. Writer spoke to P.A. and got an order for scheduled and PRN medications for patient's stated respiratory/coughing complaints. Writer listened to patient's lungs for any adventitious sounds but patient's lung fields were clear.  R: Patient receptive and cooperative with Press photographer. Patient seen in the milieu interacting with other patients and attended evening group. Q15 minute safety checks are maintained and patient is safe at this time.

## 2013-02-06 NOTE — Progress Notes (Signed)
Pt d/c from hospital. D/C instruction given, prescriptions given, samples given and bus pass. Pt denies si and hi. Shoes and gloves returned. Everlene Balls addressing pt's needs as items not returned from ED.

## 2013-02-06 NOTE — Progress Notes (Signed)
[facility] Adult Case Management Discharge Plan :  Will you be returning to the same living situation after discharge: Yes,  pt was homeless prior to admission and will continue to be homeless.  Pt states he has a tent and prefers to stay on the street then a shelter.   At discharge, do you have transportation home?:Yes,  access to transportation, provided pt with a bus pass Do you have the ability to pay for your medications:Yes,  access to meds  Release of information consent forms completed and in the chart;  Patient's signature needed at discharge.  Patient to Follow up at: Follow-up Information   Follow up with [facility] On 02/11/2013. (Walk in on this date for hospital discharge appointment, for medication management and therapy. Walk in clinic is Monday - Friday 8 am - 3 pm. )    Contact information:   [address], [place] [address] Phone: [phone] Fax: [phone]      Patient denies SI/HI:   Yes,  denies SI/HI    Safety Planning and Suicide Prevention discussed:  Yes,  discussed with pt, n/a to contact family or friend due to no SI on admission.   Carmina Miller 02/06/2013, 10:11 AM

## 2013-02-06 NOTE — [facility] Suicide Risk Assessment (Deleted)
Suicide Risk Assessment  Discharge Assessment     Demographic Factors:  Male and Living alone  Mental Status Per Nursing Assessment::   On Admission:     Current Mental Status by Physician: In full contact with reality. There are no suicidal ideas, plans or intent. No acute withdrawal. .He is committed to abstinence. He is looking forward to be going back to AA. He is going to be more active, reach out, get mobilized decrease the isolation.    Loss Factors: NA  Historical Factors: NA  Risk Reduction Factors:   Positive social support  Continued Clinical Symptoms:  Depression:   Comorbid alcohol abuse/dependence Alcohol/Substance Abuse/Dependencies  Cognitive Features That Contribute To Risk:  Polarized thinking Thought constriction (tunnel vision)    Suicide Risk:  Minimal: No identifiable suicidal ideation.  Patients presenting with no risk factors but with morbid ruminations; may be classified as minimal risk based on the severity of the depressive symptoms  Discharge Diagnoses:   AXIS I:  Alcohol Dependence, S/P withdrawal, Cocaine Abuse, Depressive Disorder NOS AXIS II:  Deferred AXIS III:   Past Medical History  Diagnosis Date  . Heart failure   . Arthritis   . Alcohol abuse     Heavy alcohol abuse and homelessness  . Tobacco abuse   . Cocaine abuse     And crack   . Homelessness   . COPD (chronic obstructive pulmonary disease)   . Suicide attempt   . CHF (congestive heart failure)    AXIS IV:  other psychosocial or environmental problems AXIS V:  61-70 mild symptoms  Plan Of Care/Follow-up recommendations:  Activity:  as tolerated Diet:  regular Follow up outpatient basis/AA/PCP for blood pressure Is patient on multiple antipsychotic therapies at discharge:  No   Has Patient had three or more failed trials of antipsychotic monotherapy by history:  No  Recommended Plan for Multiple Antipsychotic Therapies: NA  Brian [name] A 02/06/2013, 1:06 PM

## 2013-02-06 NOTE — Plan of Care (Signed)
Problem: Ineffective individual coping Goal: LTG: Patient will report a decrease in negative feelings Outcome: Completed/Met Date Met:  02/06/13 Pt denies si  Problem: Diagnosis: Increased Risk For Suicide Attempt Goal: STG-Patient Will Attend All Groups On The Unit Outcome: Not Met (add Reason) Pt did not attend all groups.

## 2013-02-10 NOTE — Progress Notes (Signed)
Patient Discharge Instructions:  After Visit Summary (AVS):   Faxed to:  02/10/13 Psychiatric Admission Assessment Note:   Faxed to:  02/10/13 Suicide Risk Assessment - Discharge Assessment:   Faxed to:  02/10/13 Faxed/Sent to the Next Level Care provider:  02/10/13 Faxed to [facility] @ [phone]  Jerelene Redden, 02/10/2013, 4:16 PM

## 2013-02-12 NOTE — Discharge Summary (Signed)
Physician Discharge Summary Note  Patient:  Brian [name] is an 57 y.o., male MRN:  [id number] DOB:  Nov 05, 1955 Patient phone:  [phone] (home)  Patient address:   [address]  [place] [address],   Date of Admission:  02/03/2013 Date of Discharge: 02/06/2013  Reason for Admission:  57 Y/O male who came to the ED requesting help for his alcohol and cocaine dependence. He admitted to drinking every day an undermined amount of alcohol and as much cocaine as he can get. When he did not drink, he would experience withdrawal: nausea vomiting sweats tremors, agitation. His situation was made worst by the fact he was homeless. 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   HENT: Negative.   Eyes: Negative.   Respiratory: Positive for shortness of breath.   Cardiovascular: Negative.   Gastrointestinal: Negative.   Genitourinary: Negative.   Musculoskeletal: Negative.   Skin: Negative.   Neurological: Negative.   Endo/Heme/Allergies: Negative.   Psychiatric/Behavioral: Positive for depression and substance abuse. The patient is nervous/anxious.     DSM5:  Schizophrenia Disorders:  none Obsessive-Compulsive Disorders:  none Trauma-Stressor Disorders:  none Substance/Addictive Disorders:  Alcohol Related Disorder - Severe (303.90), Cocaine Dependence Severe Depressive Disorders:  Major Depressive Disorder - Moderate (296.22)  Axis Diagnosis:   AXIS I:  Mood Disorder NOS and Substance Induced Mood Disorder AXIS II:  Deferred AXIS III:   Past Medical History  Diagnosis Date  . Heart failure   . Arthritis   . Alcohol abuse     Heavy alcohol abuse and homelessness  . Tobacco abuse   . Cocaine abuse     And crack   . Homelessness   . COPD (chronic obstructive pulmonary disease)   . Suicide attempt   . CHF  (congestive heart failure)    AXIS IV:  economic problems, housing problems, occupational problems and other psychosocial or environmental problems AXIS V:  51-60 moderate symptoms  Level of Care:  OP  Hospital Course:  He was admitted and started in individual and group psychotherapy. He was detox with Librium. The detox per se went uneventfully except for tremors and sweats that tended to persist a little longer. Thing were made worst by the fact the ED lost his personal belongings.. He stated among other things he had a brand new heavy coat that he got getting ready to be homeless during winter, his ID's and other personal papers. He became increasingly more irritated as he kept ruminating about they losing his things and the fact he had to get a new ID, etc. The administration told him he was going to be compensated and eventually he settled down. He was placed on Seroquel what he had found to be useful in the past. Upon D/C fully detox without any suicidal ideas plans or intent  Consults:  None  Significant Diagnostic Studies:  As per the chart  Discharge Vitals:   Blood pressure 136/95, pulse 68, temperature 98.2 F (36.8 C), temperature source Oral, resp. rate 20, height 5\' 6"  (1.676 m), weight 90.719 kg (200 lb), SpO2 94.00%. Body mass index is 32.3 kg/(m^2). Lab Results:   No results found for this or any previous visit (from the past 72 hour(s)).  Physical Findings: AIMS: Facial and Oral Movements Muscles of Facial Expression: None, normal Lips and Perioral Area:  None, normal Jaw: None, normal Tongue: None, normal,Extremity Movements Upper (arms, wrists, hands, fingers): None, normal Lower (legs, knees, ankles, toes): None, normal, Trunk Movements Neck, shoulders, hips: None, normal, Overall Severity Severity of abnormal movements (highest score from questions above): None, normal Incapacitation due to abnormal movements: None, normal Patient's awareness of abnormal movements  (rate only patient's report): No Awareness, Dental Status Current problems with teeth and/or dentures?: No Does patient usually wear dentures?: No  CIWA:  CIWA-Ar Total: 0 COWS:  COWS Total Score: 2  Psychiatric Specialty Exam: See Psychiatric Specialty Exam and Suicide Risk Assessment completed by Attending Physician prior to discharge.  Discharge destination:  Other:  shelter  Is patient on multiple antipsychotic therapies at discharge:  No   Has Patient had three or more failed trials of antipsychotic monotherapy by history:  No  Recommended Plan for Multiple Antipsychotic Therapies: NA  Discharge Orders   Future Orders Complete By Expires   Discharge instructions  As directed    Comments:     Please follow up with your Primary care provider for management of your medical problems. You were started on a medication for high blood pressure during your admission and have been given a prescription for thirty days.       Medication List       Indication   hydrochlorothiazide 25 MG tablet  Commonly known as:  HYDRODIURIL  Take 1 tablet (25 mg total) by mouth daily.   Indication:  hypertension     hydrOXYzine 25 MG tablet  Commonly known as:  ATARAX/VISTARIL  Take 1 tablet (25 mg total) by mouth every 6 (six) hours as needed for anxiety.      QUEtiapine 100 MG tablet  Commonly known as:  SEROQUEL  Take 1 tablet (100 mg total) by mouth at bedtime.   Indication:  Trouble Sleeping     QUEtiapine 25 MG tablet  Commonly known as:  SEROQUEL  Take 1 tablet (25 mg total) by mouth 2 (two) times daily.   Indication:  agitation           Follow-up Information   Follow up with [facility] On 02/11/2013. (Walk in on this date for hospital discharge appointment, for medication management and therapy. Walk in clinic is Monday - Friday 8 am - 3 pm. )    Contact information:   [address], [place] [address] Phone: [phone] Fax: [phone]      Follow-up  recommendations:  Activity:  as tolerated Diet:  regular  Comments:  Continue to work your relapse prevention plan, work with the [facility] and go to AA  Total Discharge Time:  Greater than 30 minutes.  Signed: Lennix Kneisel A 02/12/2013, 7:47 PM

## 2013-03-03 ENCOUNTER — Encounter (HOSPITAL_COMMUNITY): Payer: Self-pay | Admitting: Emergency Medicine

## 2013-03-03 ENCOUNTER — Emergency Department (HOSPITAL_COMMUNITY): Payer: Self-pay

## 2013-03-03 ENCOUNTER — Emergency Department (HOSPITAL_COMMUNITY)
Admission: EM | Admit: 2013-03-03 | Discharge: 2013-03-04 | Disposition: A | Discharge status: Home / Self Care | Payer: PRIVATE HEALTH INSURANCE | Attending: Emergency Medicine | Admitting: Emergency Medicine

## 2013-03-03 ENCOUNTER — Emergency Department (HOSPITAL_COMMUNITY): Payer: PRIVATE HEALTH INSURANCE

## 2013-03-03 DIAGNOSIS — F17200 Nicotine dependence, unspecified, uncomplicated: Secondary | ICD-10-CM | POA: Insufficient documentation

## 2013-03-03 DIAGNOSIS — Z5900 Homelessness unspecified: Secondary | ICD-10-CM | POA: Insufficient documentation

## 2013-03-03 DIAGNOSIS — R109 Unspecified abdominal pain: Secondary | ICD-10-CM | POA: Insufficient documentation

## 2013-03-03 DIAGNOSIS — R112 Nausea with vomiting, unspecified: Secondary | ICD-10-CM | POA: Insufficient documentation

## 2013-03-03 DIAGNOSIS — F1020 Alcohol dependence, uncomplicated: Secondary | ICD-10-CM | POA: Insufficient documentation

## 2013-03-03 DIAGNOSIS — Z8739 Personal history of other diseases of the musculoskeletal system and connective tissue: Secondary | ICD-10-CM | POA: Insufficient documentation

## 2013-03-03 DIAGNOSIS — F1410 Cocaine abuse, uncomplicated: Secondary | ICD-10-CM | POA: Insufficient documentation

## 2013-03-03 DIAGNOSIS — J4489 Other specified chronic obstructive pulmonary disease: Secondary | ICD-10-CM | POA: Insufficient documentation

## 2013-03-03 DIAGNOSIS — Z79899 Other long term (current) drug therapy: Secondary | ICD-10-CM | POA: Insufficient documentation

## 2013-03-03 DIAGNOSIS — M79609 Pain in unspecified limb: Secondary | ICD-10-CM | POA: Insufficient documentation

## 2013-03-03 DIAGNOSIS — I509 Heart failure, unspecified: Secondary | ICD-10-CM | POA: Insufficient documentation

## 2013-03-03 DIAGNOSIS — J449 Chronic obstructive pulmonary disease, unspecified: Secondary | ICD-10-CM | POA: Insufficient documentation

## 2013-03-03 MED ORDER — MORPHINE SULFATE 4 MG/ML IJ SOLN
4.0000 mg | Freq: Once | INTRAMUSCULAR | Status: AC
  Administered 2013-03-04: 4 mg via INTRAVENOUS
  Filled 2013-03-03: qty 1

## 2013-03-03 MED ORDER — IOHEXOL 300 MG/ML  SOLN: 50.0000 mL | Freq: Once | INTRAMUSCULAR | Status: AC | PRN

## 2013-03-03 MED ORDER — ONDANSETRON HCL 4 MG/2ML IJ SOLN
4.0000 mg | Freq: Once | INTRAMUSCULAR | Status: AC
  Administered 2013-03-04: 4 mg via INTRAVENOUS
  Filled 2013-03-03: qty 2

## 2013-03-03 NOTE — ED Provider Notes (Signed)
CSN: [id number]     Arrival date & time 03/03/13  2101 History   First MD Initiated Contact with Patient 03/03/13 2327     Chief Complaint  Patient presents with  . Abdominal Pain  . Extremity Pain   (Consider location/radiation/quality/duration/timing/severity/associated sxs/prior Treatment) HPI Comments: Patient presents to the ED with a chief complaint of abdominal pain. He states that his has had the abdominal pain for about 3 weeks. He states that nothing makes his symptoms better or worse. States the pain is 10 out of 10. He admits to heavy alcohol and drug abuse. Also complains of left extremity pain, but he has had several workups for this before. He has not tried anything to alleviate his symptoms. He denies fevers or chills. She endorses nausea and vomiting, but no hematemesis. Denies any diarrhea or constipation.  The history is provided by the patient. No language interpreter was used.    Past Medical History  Diagnosis Date  . Heart failure   . Arthritis   . Alcohol abuse     Heavy alcohol abuse and homelessness  . Tobacco abuse   . Cocaine abuse     And crack   . Homelessness   . COPD (chronic obstructive pulmonary disease)   . Suicide attempt   . CHF (congestive heart failure)    Past Surgical History  Procedure Laterality Date  . Inner ear surgery     Family History  Problem Relation Age of Onset  . Coronary artery disease    . Diabetes     History  Substance Use Topics  . Smoking status: Current Every Day Smoker -- 2.00 packs/day    Types: Cigarettes  . Smokeless tobacco: Never Used  . Alcohol Use: 100.8 oz/week    168 Cans of beer per week     Comment: Heavy. 5-10 40oz daily    Review of Systems  All other systems reviewed and are negative.    Allergies  Review of patient's allergies indicates no known allergies.  Home Medications   Current Outpatient Rx  Name  Route  Sig  Dispense  Refill  . hydrochlorothiazide (HYDRODIURIL) 25 MG  tablet   Oral   Take 1 tablet (25 mg total) by mouth daily.   30 tablet   0   . hydrOXYzine (ATARAX/VISTARIL) 25 MG tablet   Oral   Take 1 tablet (25 mg total) by mouth every 6 (six) hours as needed for anxiety.   30 tablet   0   . QUEtiapine (SEROQUEL) 100 MG tablet   Oral   Take 1 tablet (100 mg total) by mouth at bedtime.   30 tablet   0   . QUEtiapine (SEROQUEL) 25 MG tablet   Oral   Take 1 tablet (25 mg total) by mouth 2 (two) times daily.   60 tablet   0    BP 132/83  Pulse 85  Temp(Src) 98.2 F (36.8 C) (Oral)  Resp 18  SpO2 93% Physical Exam  Nursing note and vitals reviewed. Constitutional: He is oriented to person, place, and time. He appears well-developed and well-nourished.  HENT:  Head: Normocephalic and atraumatic.  Right Ear: External ear normal.  Left Ear: External ear normal.  Nose: Nose normal.  Mouth/Throat: Oropharynx is clear and moist. No oropharyngeal exudate.  Eyes: Conjunctivae and EOM are normal. Pupils are equal, round, and reactive to light. Right eye exhibits no discharge. Left eye exhibits no discharge. No scleral icterus.  Neck: Normal range of motion.  Neck supple. No JVD present.  Cardiovascular: Normal rate, regular rhythm, normal heart sounds and intact distal pulses.  Exam reveals no gallop and no friction rub.   No murmur heard. Pulmonary/Chest: Effort normal and breath sounds normal. No respiratory distress. He has no wheezes. He has no rales. He exhibits no tenderness.  Abdominal: Soft. He exhibits distension. He exhibits no mass. There is tenderness. There is guarding. There is no rebound.  Diffuse abdominal tenderness, worse in the epigastric region  Musculoskeletal: Normal range of motion. He exhibits no edema and no tenderness.  Neurological: He is alert and oriented to person, place, and time.  Skin: Skin is warm and dry.  Psychiatric: He has a normal mood and affect. His behavior is normal. Judgment and thought content  normal.    ED Course  Procedures (including critical care time) Labs Review Labs Reviewed  CBC WITH DIFFERENTIAL  COMPREHENSIVE METABOLIC PANEL  LIPASE, BLOOD  URINE RAPID DRUG SCREEN (HOSP PERFORMED)   Imaging Review No results found.  EKG Interpretation   None       MDM  No diagnosis found.    Patient signed out to [facility], NP, who will continue care.  Plan:  Follow up on labs and CT.    Dispo pending labs and CT.  Roxy Horseman, PA-C 03/04/13 [phone]

## 2013-03-03 NOTE — ED Notes (Signed)
Pt complains of abd pain and extremity pain for three weeks

## 2013-03-04 ENCOUNTER — Emergency Department (HOSPITAL_COMMUNITY): Payer: PRIVATE HEALTH INSURANCE

## 2013-03-04 ENCOUNTER — Encounter (HOSPITAL_COMMUNITY): Payer: Self-pay

## 2013-03-04 LAB — CBC WITH DIFFERENTIAL/PLATELET
Basophils Relative: 0 % (ref 0–1)
Eosinophils Absolute: 0 10*3/uL (ref 0.0–0.7)
Hemoglobin: 16.3 g/dL (ref 13.0–17.0)
MCH: 33.3 pg (ref 26.0–34.0)
MCHC: 35.8 g/dL (ref 30.0–36.0)
Monocytes Absolute: 0.6 10*3/uL (ref 0.1–1.0)
Monocytes Relative: 6 % (ref 3–12)
Neutrophils Relative %: 67 % (ref 43–77)
Platelets: 209 10*3/uL (ref 150–400)

## 2013-03-04 LAB — COMPREHENSIVE METABOLIC PANEL
Albumin: 4.1 g/dL (ref 3.5–5.2)
Alkaline Phosphatase: 35 U/L — ABNORMAL LOW (ref 39–117)
BUN: 8 mg/dL (ref 6–23)
Calcium: 9.5 mg/dL (ref 8.4–10.5)
GFR calc non Af Amer: >90 mL/min (ref >90)
Potassium: 3.7 mEq/L (ref 3.5–5.1)
Sodium: 131 mEq/L — ABNORMAL LOW (ref 135–145)
Total Protein: 7.2 g/dL (ref 6.0–8.3)

## 2013-03-04 LAB — RAPID URINE DRUG SCREEN, HOSP PERFORMED
Amphetamines: NOT DETECTED
Barbiturates: NOT DETECTED
Benzodiazepines: NOT DETECTED
Cocaine: POSITIVE — AB
Opiates: NOT DETECTED

## 2013-03-04 LAB — POCT I-STAT TROPONIN I: Troponin i, poc: 0.01 ng/mL (ref 0.00–0.08)

## 2013-03-04 LAB — LIPASE, BLOOD: Lipase: 39 U/L (ref 11–59)

## 2013-03-04 MED ORDER — IOHEXOL 300 MG/ML  SOLN
50.0000 mL | Freq: Once | INTRAMUSCULAR | Status: AC | PRN
  Administered 2013-03-04: 50 mL via ORAL

## 2013-03-04 MED ORDER — IOHEXOL 300 MG/ML  SOLN
100.0000 mL | Freq: Once | INTRAMUSCULAR | Status: AC | PRN
  Administered 2013-03-04: 100 mL via INTRAVENOUS

## 2013-03-04 MED ORDER — SUCRALFATE 1 G PO TABS: 1.0000 g | ORAL_TABLET | Freq: Three times a day (TID) | ORAL | Status: DC

## 2013-03-04 MED ORDER — SUCRALFATE 1 G PO TABS
1.0000 g | ORAL_TABLET | Freq: Once | ORAL | Status: AC
  Administered 2013-03-04: 1 g via ORAL
  Filled 2013-03-04: qty 1

## 2013-03-04 NOTE — ED Provider Notes (Signed)
Medical screening examination/treatment/procedure(s) were performed by non-physician practitioner and as supervising physician I was immediately available for consultation/collaboration.  EKG Interpretation   None        Derwood Kaplan, MD 03/04/13 [phone]

## 2013-03-04 NOTE — ED Provider Notes (Signed)
Mr. Brian [name] is a 57 year old gentleman, who has a history of, alcohol abuse comes in tonight with a three-week history of epigastric pain.  No vomiting.  No blood in his stool Patient's labs and CT scan are reviewed with the patient.  He'll be given Carafate and discharged home  Arman Filter, NP 03/04/13 [phone]

## 2013-03-04 NOTE — ED Provider Notes (Signed)
Medical screening examination/treatment/procedure(s) were performed by non-physician practitioner and as supervising physician I was immediately available for consultation/collaboration.  EKG Interpretation   None        Derwood Kaplan, MD 03/04/13 [phone]

## 2013-05-17 ENCOUNTER — Emergency Department: Payer: Self-pay | Admitting: Emergency Medicine

## 2013-10-07 DIAGNOSIS — J4489 Other specified chronic obstructive pulmonary disease: Secondary | ICD-10-CM | POA: Insufficient documentation

## 2013-10-07 DIAGNOSIS — Z8659 Personal history of other mental and behavioral disorders: Secondary | ICD-10-CM | POA: Insufficient documentation

## 2013-10-07 DIAGNOSIS — Z5900 Homelessness unspecified: Secondary | ICD-10-CM | POA: Insufficient documentation

## 2013-10-07 DIAGNOSIS — F1420 Cocaine dependence, uncomplicated: Secondary | ICD-10-CM | POA: Insufficient documentation

## 2013-10-07 DIAGNOSIS — F17200 Nicotine dependence, unspecified, uncomplicated: Secondary | ICD-10-CM | POA: Insufficient documentation

## 2013-10-07 DIAGNOSIS — R079 Chest pain, unspecified: Secondary | ICD-10-CM | POA: Insufficient documentation

## 2013-10-07 DIAGNOSIS — I509 Heart failure, unspecified: Secondary | ICD-10-CM | POA: Insufficient documentation

## 2013-10-07 DIAGNOSIS — R000 Tachycardia, unspecified: Secondary | ICD-10-CM | POA: Insufficient documentation

## 2013-10-07 DIAGNOSIS — Z8739 Personal history of other diseases of the musculoskeletal system and connective tissue: Secondary | ICD-10-CM | POA: Insufficient documentation

## 2013-10-07 DIAGNOSIS — J449 Chronic obstructive pulmonary disease, unspecified: Secondary | ICD-10-CM | POA: Insufficient documentation

## 2013-10-08 ENCOUNTER — Emergency Department (HOSPITAL_COMMUNITY): Payer: PRIVATE HEALTH INSURANCE

## 2013-10-08 ENCOUNTER — Emergency Department (HOSPITAL_COMMUNITY)
Admission: EM | Admit: 2013-10-08 | Discharge: 2013-10-08 | Disposition: A | Discharge status: Home / Self Care | Payer: PRIVATE HEALTH INSURANCE | Attending: Emergency Medicine | Admitting: Emergency Medicine

## 2013-10-08 ENCOUNTER — Encounter (HOSPITAL_COMMUNITY): Payer: Self-pay | Admitting: Emergency Medicine

## 2013-10-08 DIAGNOSIS — F1410 Cocaine abuse, uncomplicated: Secondary | ICD-10-CM

## 2013-10-08 DIAGNOSIS — R079 Chest pain, unspecified: Secondary | ICD-10-CM

## 2013-10-08 LAB — I-STAT TROPONIN, ED
TROPONIN I, POC: 0 ng/mL (ref 0.00–0.08)
Troponin i, poc: 0.01 ng/mL (ref 0.00–0.08)

## 2013-10-08 LAB — BASIC METABOLIC PANEL
BUN: 14 mg/dL (ref 6–23)
CO2: 24 mEq/L (ref 19–32)
CREATININE: 0.96 mg/dL (ref 0.50–1.35)
Calcium: 9.2 mg/dL (ref 8.4–10.5)
Chloride: 99 mEq/L (ref 96–112)
GFR calc non Af Amer: >90 mL/min (ref >90)
Glucose, Bld: 95 mg/dL (ref 70–99)
Potassium: 4.2 mEq/L (ref 3.7–5.3)
Sodium: 139 mEq/L (ref 137–147)

## 2013-10-08 LAB — CBC
HCT: 49.8 % (ref 39.0–52.0)
Hemoglobin: 16.9 g/dL (ref 13.0–17.0)
MCH: 32.8 pg (ref 26.0–34.0)
MCHC: 33.9 g/dL (ref 30.0–36.0)
MCV: 96.7 fL (ref 78.0–100.0)
Platelets: 178 10*3/uL (ref 150–400)
RBC: 5.15 MIL/uL (ref 4.22–5.81)
RDW: 13.1 % (ref 11.5–15.5)
WBC: 10 10*3/uL (ref 4.0–10.5)

## 2013-10-08 LAB — RAPID URINE DRUG SCREEN, HOSP PERFORMED
Amphetamines: NOT DETECTED
Barbiturates: NOT DETECTED
Benzodiazepines: NOT DETECTED
Cocaine: POSITIVE — AB
Opiates: NOT DETECTED
TETRAHYDROCANNABINOL: NOT DETECTED

## 2013-10-08 LAB — PRO B NATRIURETIC PEPTIDE: PRO B NATRI PEPTIDE: 281.5 pg/mL — AB (ref 0–125)

## 2013-10-08 NOTE — ED Provider Notes (Signed)
CSN: [id number]     Arrival date & time 10/07/13  2354 History   First MD Initiated Contact with Patient 10/08/13 0012     Chief Complaint  Patient presents with  . Chest Pain     (Consider location/radiation/quality/duration/timing/severity/associated sxs/prior Treatment) HPI  This is a 58 year old male with a history of polysubstance abuse including cocaine, COPD, CHF who presents for chest pain.  Patient reports he has had left arm and chest pain for the past 3 hours. He states the pain is sharp and radiates to his back. He has never had pain like this before. He also reports right arm tingling. He denies any weakness or numbness. He endorses nonproductive cough. He is a current smoker. Patient states that "a year or 2 ago supposed to have a stent but got scared and left the hospital." Patient rates pain at 8/10. He denies any recent drug use including cocaine. He denies any shortness of breath or diaphoresis. Pain is not worsened with exertion.  Past Medical History  Diagnosis Date  . Heart failure   . Arthritis   . Alcohol abuse     Heavy alcohol abuse and homelessness  . Tobacco abuse   . Cocaine abuse     And crack   . Homelessness   . COPD (chronic obstructive pulmonary disease)   . Suicide attempt   . CHF (congestive heart failure)    Past Surgical History  Procedure Laterality Date  . Inner ear surgery     Family History  Problem Relation Age of Onset  . Coronary artery disease    . Diabetes     History  Substance Use Topics  . Smoking status: Current Every Day Smoker -- 2.00 packs/day    Types: Cigarettes  . Smokeless tobacco: Never Used  . Alcohol Use: 100.8 oz/week    168 Cans of beer per week     Comment: Heavy. 5-10 40oz daily    Review of Systems  Constitutional: Negative.  Negative for fever.  Respiratory: Positive for cough and chest tightness. Negative for shortness of breath.   Cardiovascular: Positive for chest pain.  Gastrointestinal: Negative.   Negative for nausea, vomiting and abdominal pain.  Genitourinary: Negative.  Negative for dysuria.  Musculoskeletal: Positive for back pain.  Skin: Negative for rash.  Neurological: Negative for headaches.  All other systems reviewed and are negative.     Allergies  Review of patient's allergies indicates no known allergies.  Home Medications   Prior to Admission medications   Not on File   BP 136/56  Pulse 95  Temp(Src) 98 F (36.7 C) (Oral)  Resp 31  SpO2 92% Physical Exam  Nursing note and vitals reviewed. Constitutional: He is oriented to person, place, and time.  Disheveled, smells of tobacco  HENT:  Head: Normocephalic and atraumatic.  Mouth/Throat: Oropharynx is clear and moist.  Eyes: Pupils are equal, round, and reactive to light.  Neck: No JVD present.  Cardiovascular: Regular rhythm and normal heart sounds.   No murmur heard. Tachycardia  Pulmonary/Chest: Effort normal and breath sounds normal. No respiratory distress. He has no wheezes.  Abdominal: Soft. Bowel sounds are normal. There is no tenderness. There is no rebound.  Musculoskeletal: He exhibits edema.  Neurological: He is alert and oriented to person, place, and time.  Equal grip strength bilaterally  Skin: Skin is warm and dry.  Psychiatric: He has a normal mood and affect.    ED Course  Procedures (including critical care time) Labs  Review Labs Reviewed  PRO B NATRIURETIC PEPTIDE - Abnormal; Notable for the following:    Pro B Natriuretic peptide (BNP) 281.5 (*)    All other components within normal limits  URINE RAPID DRUG SCREEN (HOSP PERFORMED) - Abnormal; Notable for the following:    Cocaine POSITIVE (*)    All other components within normal limits  CBC  BASIC METABOLIC PANEL  I-STAT TROPOININ, ED  Rosezena Sensor, ED    Imaging Review Dg Chest 2 View  10/08/2013   CLINICAL DATA:  Chest pain.  Left arm numbness and pain  EXAM: CHEST  2 VIEW  COMPARISON:  03/03/2013  FINDINGS:  The heart size and mediastinal contours are within normal limits. Both lungs are clear. Spondylosis noted within the thoracic spine.  IMPRESSION: No active cardiopulmonary disease.   Electronically Signed   By: Signa Kell M.D.   On: 10/08/2013 00:49     EKG Interpretation   Date/Time:  Wednesday October 08 2013 00:02:46 EDT Ventricular Rate:  108 PR Interval:  118 QRS Duration: 90 QT Interval:  320 QTC Calculation: 428 R Axis:   71 Text Interpretation:  Sinus tachycardia T wave abnormality, consider  inferior ischemia Abnormal ECG Similar to prior Confirmed by Kylian Loh  MD,  Dava Rensch ([id number]) on 10/08/2013 12:12:46 AM      MDM   Final diagnoses:  Chest pain, unspecified chest pain type  Cocaine abuse    Patient presents with chest pain. He is homeless and generally disheveled appearing. Vital signs are reassuring. He is not hypoxic. Lung sounds are clear. Patient was given full dose aspirin. Review of the patient's chart reveals that he had several admissions for cocaine-induced chest pain in the past.  Patient was offered cardiac catheterization but declined.  EKG is similar to prior EKG.  UDS is positive for cocaine.  Chest x-ray is negative and BNP is within normal limits. On recheck, patient is resting comfortably. Vital signs are reassuring. Patient reports improvement of his pain.  Will obtain delta troponin. Second troponin is negative. Suspect patient's chest pain is likely related to recent cocaine use. Discuss with patient the need to abstain from cocaine.  Patient was given referral to cardiology and instructed to call for followup in one to 2 days.  After history, exam, and medical workup I feel the patient has been appropriately medically screened and is safe for discharge home. Pertinent diagnoses were discussed with the patient. Patient was given return precautions.     Shon Baton, MD 10/08/13 [phone]

## 2013-10-08 NOTE — Discharge Instructions (Signed)

## 2013-10-08 NOTE — ED Notes (Addendum)
Presents with onset of left sided chest pain began 3 hours ago while walking. 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 Also c/o of 3 days of left arm numbness and inability to grip. Alert, oriented. Answers all questions appropriately.  Given 324 ASA and 1 nitro with no relief by EMS

## 2013-12-04 ENCOUNTER — Encounter (HOSPITAL_COMMUNITY): Payer: Self-pay | Admitting: Emergency Medicine

## 2013-12-04 ENCOUNTER — Emergency Department (HOSPITAL_COMMUNITY)
Admission: EM | Admit: 2013-12-04 | Discharge: 2013-12-04 | Discharge status: Against Medical Advice | Payer: PRIVATE HEALTH INSURANCE | Attending: Emergency Medicine | Admitting: Emergency Medicine

## 2013-12-04 ENCOUNTER — Emergency Department (HOSPITAL_COMMUNITY): Payer: PRIVATE HEALTH INSURANCE

## 2013-12-04 ENCOUNTER — Emergency Department (HOSPITAL_COMMUNITY): Payer: Self-pay

## 2013-12-04 DIAGNOSIS — F17200 Nicotine dependence, unspecified, uncomplicated: Secondary | ICD-10-CM | POA: Insufficient documentation

## 2013-12-04 DIAGNOSIS — R079 Chest pain, unspecified: Secondary | ICD-10-CM | POA: Insufficient documentation

## 2013-12-04 DIAGNOSIS — F10920 Alcohol use, unspecified with intoxication, uncomplicated: Secondary | ICD-10-CM

## 2013-12-04 DIAGNOSIS — Z8739 Personal history of other diseases of the musculoskeletal system and connective tissue: Secondary | ICD-10-CM | POA: Insufficient documentation

## 2013-12-04 DIAGNOSIS — Z5900 Homelessness unspecified: Secondary | ICD-10-CM | POA: Insufficient documentation

## 2013-12-04 DIAGNOSIS — I509 Heart failure, unspecified: Secondary | ICD-10-CM | POA: Insufficient documentation

## 2013-12-04 DIAGNOSIS — F1010 Alcohol abuse, uncomplicated: Secondary | ICD-10-CM | POA: Insufficient documentation

## 2013-12-04 DIAGNOSIS — J4489 Other specified chronic obstructive pulmonary disease: Secondary | ICD-10-CM | POA: Insufficient documentation

## 2013-12-04 DIAGNOSIS — J449 Chronic obstructive pulmonary disease, unspecified: Secondary | ICD-10-CM | POA: Insufficient documentation

## 2013-12-04 DIAGNOSIS — I252 Old myocardial infarction: Secondary | ICD-10-CM | POA: Insufficient documentation

## 2013-12-04 DIAGNOSIS — G8929 Other chronic pain: Secondary | ICD-10-CM | POA: Insufficient documentation

## 2013-12-04 LAB — COMPREHENSIVE METABOLIC PANEL
ALT: 20 U/L (ref 0–53)
AST: 24 U/L (ref 0–37)
Albumin: 4.1 g/dL (ref 3.5–5.2)
Alkaline Phosphatase: 35 U/L — ABNORMAL LOW (ref 39–117)
Anion gap: 17 — ABNORMAL HIGH (ref 5–15)
BUN: 12 mg/dL (ref 6–23)
CALCIUM: 9.2 mg/dL (ref 8.4–10.5)
CO2: 21 mEq/L (ref 19–32)
CREATININE: 0.79 mg/dL (ref 0.50–1.35)
Chloride: 97 mEq/L (ref 96–112)
GFR calc non Af Amer: >90 mL/min (ref >90)
GLUCOSE: 100 mg/dL — AB (ref 70–99)
Potassium: 4.6 mEq/L (ref 3.7–5.3)
SODIUM: 135 meq/L — AB (ref 137–147)
TOTAL PROTEIN: 7.4 g/dL (ref 6.0–8.3)
Total Bilirubin: 0.2 mg/dL — ABNORMAL LOW (ref 0.3–1.2)

## 2013-12-04 LAB — I-STAT TROPONIN, ED
Troponin i, poc: 0.01 ng/mL (ref 0.00–0.08)
Troponin i, poc: 0 ng/mL (ref 0.00–0.08)

## 2013-12-04 LAB — I-STAT CHEM 8, ED
BUN: 12 mg/dL (ref 6–23)
CALCIUM ION: 1.22 mmol/L (ref 1.12–1.23)
Chloride: 97 mEq/L (ref 96–112)
Creatinine, Ser: 1.1 mg/dL (ref 0.50–1.35)
GLUCOSE: 116 mg/dL — AB (ref 70–99)
HCT: 49 % (ref 39.0–52.0)
HEMOGLOBIN: 16.7 g/dL (ref 13.0–17.0)
Potassium: 4.2 mEq/L (ref 3.7–5.3)
Sodium: 137 mEq/L (ref 137–147)
TCO2: 25 mmol/L (ref 0–100)

## 2013-12-04 LAB — CBC WITH DIFFERENTIAL/PLATELET
Basophils Absolute: 0 10*3/uL (ref 0.0–0.1)
Basophils Relative: 1 % (ref 0–1)
EOS ABS: 0 10*3/uL (ref 0.0–0.7)
EOS PCT: 1 % (ref 0–5)
HCT: 46.1 % (ref 39.0–52.0)
Hemoglobin: 15.4 g/dL (ref 13.0–17.0)
LYMPHS ABS: 2.4 10*3/uL (ref 0.7–4.0)
Lymphocytes Relative: 35 % (ref 12–46)
MCH: 31.2 pg (ref 26.0–34.0)
MCHC: 33.4 g/dL (ref 30.0–36.0)
MCV: 93.3 fL (ref 78.0–100.0)
Monocytes Absolute: 0.5 10*3/uL (ref 0.1–1.0)
Monocytes Relative: 7 % (ref 3–12)
Neutro Abs: 3.9 10*3/uL (ref 1.7–7.7)
Neutrophils Relative %: 56 % (ref 43–77)
Platelets: 160 10*3/uL (ref 150–400)
RBC: 4.94 MIL/uL (ref 4.22–5.81)
RDW: 12.9 % (ref 11.5–15.5)
WBC: 6.9 10*3/uL (ref 4.0–10.5)

## 2013-12-04 LAB — ETHANOL: ALCOHOL ETHYL (B): 158 mg/dL — AB (ref 0–11)

## 2013-12-04 LAB — LIPASE, BLOOD: LIPASE: 45 U/L (ref 11–59)

## 2013-12-04 MED ORDER — SODIUM CHLORIDE 0.9 % IV BOLUS (SEPSIS)
1000.0000 mL | Freq: Once | INTRAVENOUS | Status: AC
  Administered 2013-12-04: 1000 mL via INTRAVENOUS

## 2013-12-04 MED ORDER — IOHEXOL 300 MG/ML  SOLN
50.0000 mL | Freq: Once | INTRAMUSCULAR | Status: DC | PRN
Start: 2013-12-04 — End: 2013-12-04

## 2013-12-04 MED ORDER — IOHEXOL 350 MG/ML SOLN: 100.0000 mL | Freq: Once | INTRAVENOUS | Status: DC | PRN

## 2013-12-04 NOTE — ED Provider Notes (Signed)
Patient awake and alert. He is demanding to leave. he was upset by his visitors. He is oriented x3. He states he is leaving. Before he was complaining about abdominal pain so workup was ordered. He's also had some intermittent hypoxia in the ED which is likely from his obesity and questionable accuracy of reading.  CXR was negative.  Plan was for evaluation of abdominal pain as well as assessment for PE.  He refuses further evaluation and appears to have capacity to make medical decisions AGAINST MEDICAL ADVICE. He understands that he may be at risk for deterioration or death and accepts these risks.  Glynn Octave, MD 12/04/13 [phone]

## 2013-12-04 NOTE — ED Notes (Signed)
Bed: [facility] Expected date: 12/04/13 Expected time: 1:36 AM Means of arrival: Ambulance Comments: 58 yo M  ETOH/Homeless

## 2013-12-04 NOTE — Progress Notes (Signed)
[facility] [employer],  Provided pt with a list of primary care resources, highlighting [facility]. Patient sleeping, CL left papers with patient belongings in room.

## 2013-12-04 NOTE — ED Notes (Signed)
Pt presented by EMS, [employer] [place], pt endorses being homeless, obvious intoxication as evidenced by unsteady gait, ETOH odor, and slurred speech, c/o of chest pain.

## 2013-12-04 NOTE — ED Provider Notes (Signed)
CSN: [id number]     Arrival date & time 12/04/13  0144 History   First MD Initiated Contact with Patient 12/04/13 0247     Chief Complaint  Patient presents with  . Alcohol Intoxication     (Consider location/radiation/quality/duration/timing/severity/associated sxs/prior Treatment) HPI 58 year old male presents to emergency department via EMS [place].  Patient is intoxicated, admits to same.  He reports he is homeless.  Patient also complains of chest pain.  Patient is a difficult historian, do to intoxication.  He reports ongoing daily chest pain over the last month.  He indicates his lower chest/epigastrium as source of pain.  He denies any shortness of breath, diaphoresis, nausea or vomiting. Past Medical History  Diagnosis Date  . Heart failure   . Arthritis   . Alcohol abuse     Heavy alcohol abuse and homelessness  . Tobacco abuse   . Cocaine abuse     And crack   . Homelessness   . COPD (chronic obstructive pulmonary disease)   . Suicide attempt   . CHF (congestive heart failure)    Past Surgical History  Procedure Laterality Date  . Inner ear surgery     Family History  Problem Relation Age of Onset  . Coronary artery disease    . Diabetes     History  Substance Use Topics  . Smoking status: Current Every Day Smoker -- 2.00 packs/day    Types: Cigarettes  . Smokeless tobacco: Never Used  . Alcohol Use: 100.8 oz/week    168 Cans of beer per week     Comment: Heavy. 5-10 40oz daily    Review of Systems  See History of Present Illness; otherwise all other systems are reviewed and negative   Allergies  Review of patient's allergies indicates no known allergies.  Home Medications   Prior to Admission medications   Not on File   BP 113/87  Pulse 83  Temp(Src) 98.7 F (37.1 C) (Oral)  Resp 18  SpO2 98% Physical Exam  Nursing note and vitals reviewed. Constitutional: He is oriented to person, place, and time. He  appears well-developed and well-nourished.  Disheveled male, appears older than stated age intoxicated  HENT:  Head: Normocephalic and atraumatic.  Nose: Nose normal.  Mouth/Throat: Oropharynx is clear and moist.  Eyes: Conjunctivae and EOM are normal. Pupils are equal, round, and reactive to light.  Neck: Normal range of motion. Neck supple. No JVD present. No tracheal deviation present. No thyromegaly present.  Cardiovascular: Normal rate, regular rhythm, normal heart sounds and intact distal pulses.  Exam reveals no gallop and no friction rub.   No murmur heard. Pulmonary/Chest: Effort normal and breath sounds normal. No stridor. No respiratory distress. He has no wheezes. He has no rales. He exhibits no tenderness.  Abdominal: Soft. Bowel sounds are normal. He exhibits no distension and no mass. There is tenderness (epigastric pain with palpation). There is no rebound and no guarding.  Musculoskeletal: Normal range of motion. He exhibits no edema and no tenderness.  Lymphadenopathy:    He has no cervical adenopathy.  Neurological: He is alert and oriented to person, place, and time. He exhibits normal muscle tone. Coordination normal.  Skin: Skin is warm and dry. No rash noted. No erythema. No pallor.  Psychiatric: He has a normal mood and affect. His behavior is normal. Judgment and thought content normal.    ED Course  Procedures (including critical care time) Labs Review Labs Reviewed  I-STAT CHEM 8, ED - Abnormal; Notable for the following:    Glucose, Bld 116 (*)    All other components within normal limits  Rosezena Sensor, ED    Imaging Review Dg Chest 2 View  12/04/2013   CLINICAL DATA:  Alcohol.  Unresponsive.  Chest pain.  EXAM: CHEST  2 VIEW  COMPARISON:  10/08/2013  FINDINGS: Shallow inspiration with atelectasis in the lung bases. The heart size and mediastinal contours are within normal limits. Both lungs are clear. The visualized skeletal structures are  unremarkable.  IMPRESSION: Shallow inspiration with atelectasis in the lung bases.   Electronically Signed   By: Burman Nieves M.D.   On: 12/04/2013 03:29     EKG Interpretation None      Date: 12/04/2013  Rate: 82  Rhythm: normal sinus rhythm  QRS Axis: normal  Intervals: normal  ST/T Wave abnormalities: nonspecific T wave changes  Conduction Disutrbances:none  Narrative Interpretation:   Old EKG Reviewed: unchanged   MDM   Final diagnoses:  Alcohol intoxication, uncomplicated  Chronic chest pain    58 year old male with alcohol intoxication and self-reported chest pain for a month.  EKG without ischemic changes.  Troponin negative chest x-ray unremarkable no signs of CHF.  Plan to allow to sober and will discharge    [name] Mackie, MD 12/04/13 [phone]

## 2013-12-04 NOTE — Discharge Instructions (Signed)
You are leaving against medical advice. Return to the ED if you wish to be reevaluated. Behavioral Health Resources in the [facility]  Intensive Outpatient Programs: [facility]      [address] [place], [place] [phone] Both a day and evening program       [facility]     [address]        [place], [place] [address] [phone]         ADS: [facility] [address] [place] [phone]  [facility] Mental Health ACCESS LINE: [phone] or [phone] [address] [place], [place] [address] [email or web address]  Mobile Crisis Teams:                                        [facility]         Mobile Crisis Care Unit [phone]             [facility] [address] Ginette Otto [phone]                                         Interventionist [address] [place] [place] [phone]  Self-Help/Support Groups: [facility] [employer] of support groups [phone] (call for more info)  Narcotics Anonymous (NA) [facility] [address] [place] - 2 meetings at this location  Residential Treatment Programs:  [facility]      [address]        [place]       [phone]         [facility] [address], [place] [address] [place], [place]  [address] [phone]  [facility]  [address] [place], [place] [address] [phone] Admissions: 8am-3pm M-F  [facility]     [address]        [place], [place] [address]       [phone]         [facility] [address] [place], [place] [phone]  [facility] [address] [place], [place] [phone]  [facility] - Intensive Outpatient      [address]     [place], [place]       [phone]         [facility] ([facility]     [address] [place], [place] [phone] or [phone]  [facility] ([facility])  [address] [place], [place] [phone]  [facility] [address]                                               [address] [place] [phone]  [facility] Resources: [phone]               General Therapy  Angie Fava, PhD        [address] [place], [place] [address]         [phone]   Insurance  [facility]   [address] [place], [place] [address] [phone]  [facility] [address] [place], [place] [address] [phone] Insurance/Medicaid/sponsorship through [facility]                                              [address]                                        [place], [place] [address]    Therapy/tele-psych/case         [phone]          [facility] [address], [place]  [address]  Adolescent/group home/case management [phone]                                           Creola Corn PhD       General therapy       Insurance   [phone]         Dr. Lolly Mustache Insurance [phone] M-F  [facility] Medicaid, sponsorship [phone]   Alcohol Intoxication Alcohol intoxication occurs when you drink enough alcohol that it affects your ability to function. It can be mild or very severe. Drinking a lot of alcohol in a short time is called binge drinking. This can be very harmful. Drinking alcohol can also be more dangerous if you are taking medicines or other drugs. Some of the effects caused by alcohol may include:  Loss of coordination.  Changes in mood and behavior.  Unclear thinking.  Trouble talking (slurred speech).  Throwing  up (vomiting).  Confusion.  Slowed breathing.  Twitching and shaking (seizures).  Loss of consciousness. HOME CARE  Do not drive after drinking alcohol.  Drink enough water and fluids to keep your pee (urine) clear or pale yellow. Avoid caffeine.  Only take medicine as told by your doctor. GET HELP IF:  You throw up (vomit) many times.  You do not feel better after a few days.  You frequently have alcohol intoxication. Your doctor can help decide if you should see a substance use treatment counselor. GET HELP RIGHT AWAY IF:  You become shaky when you stop drinking.  You have twitching and shaking.  You throw up blood. It may look bright red or like coffee grounds.  You notice blood in your poop (bowel movements).  You become lightheaded or pass out (faint). MAKE SURE YOU:   Understand these instructions.  Will watch your condition.  Will get help right away if you are not doing well or get worse. Document Released: 09/13/2007 Document Revised: 11/27/2012 Document Reviewed: 08/30/2012 [facility] Patient Information 2015 [place], [place]. This information is not intended to replace advice given to you by your health care  provider. Make sure you discuss any questions you have with your health care provider.  Chest Pain (Nonspecific) It is often hard to give a diagnosis for the cause of chest pain. There is always a chance that your pain could be related to something serious, such as a heart attack or a blood clot in the lungs. You need to follow up with your doctor. HOME CARE  If antibiotic medicine was given, take it as directed by your doctor. Finish the medicine even if you start to feel better.  For the next few days, avoid activities that bring on chest pain. Continue physical activities as told by your doctor.  Do not use any tobacco products. This includes cigarettes, chewing tobacco, and e-cigarettes.  Avoid drinking alcohol.  Only take medicine as told by  your doctor.  Follow your doctor's suggestions for more testing if your chest pain does not go away.  Keep all doctor visits you made. GET HELP IF:  Your chest pain does not go away, even after treatment.  You have a rash with blisters on your chest.  You have a fever. GET HELP RIGHT AWAY IF:   You have more pain or pain that spreads to your arm, neck, jaw, back, or belly (abdomen).  You have shortness of breath.  You cough more than usual or cough up blood.  You have very bad back or belly pain.  You feel sick to your stomach (nauseous) or throw up (vomit).  You have very bad weakness.  You pass out (faint).  You have chills. This is an emergency. Do not wait to see if the problems will go away. Call your local emergency services (911 in [place] Do not drive yourself to the hospital. MAKE SURE YOU:   Understand these instructions.  Will watch your condition.  Will get help right away if you are not doing well or get worse. Document Released: 09/13/2007 Document Revised: 04/01/2013 Document Reviewed: 09/13/2007 [facility] Patient Information 2015 [place], [place]. This information is not intended to replace advice given to you by your health care provider. Make sure you discuss any questions you have with your health care provider.

## 2013-12-04 NOTE — ED Notes (Signed)
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 Patient then ripped IV out, and pulled ekg leads off. Patient yelling out that he was getting out of this damn place. Writer attempted to convince the patient to stay. EDP notified and went into talk with the patient.
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
## 2014-08-16 ENCOUNTER — Encounter (HOSPITAL_COMMUNITY): Payer: Self-pay | Admitting: Emergency Medicine

## 2014-08-16 ENCOUNTER — Emergency Department (HOSPITAL_COMMUNITY): Payer: PRIVATE HEALTH INSURANCE

## 2014-08-16 ENCOUNTER — Emergency Department (HOSPITAL_COMMUNITY)
Admission: EM | Admit: 2014-08-16 | Discharge: 2014-08-16 | Disposition: A | Discharge status: Home / Self Care | Payer: PRIVATE HEALTH INSURANCE | Attending: Emergency Medicine | Admitting: Emergency Medicine

## 2014-08-16 DIAGNOSIS — J441 Chronic obstructive pulmonary disease with (acute) exacerbation: Secondary | ICD-10-CM | POA: Insufficient documentation

## 2014-08-16 DIAGNOSIS — Z720 Tobacco use: Secondary | ICD-10-CM | POA: Insufficient documentation

## 2014-08-16 DIAGNOSIS — Y9389 Activity, other specified: Secondary | ICD-10-CM | POA: Insufficient documentation

## 2014-08-16 DIAGNOSIS — R0602 Shortness of breath: Secondary | ICD-10-CM

## 2014-08-16 DIAGNOSIS — M7041 Prepatellar bursitis, right knee: Secondary | ICD-10-CM | POA: Insufficient documentation

## 2014-08-16 DIAGNOSIS — I509 Heart failure, unspecified: Secondary | ICD-10-CM | POA: Insufficient documentation

## 2014-08-16 DIAGNOSIS — F10920 Alcohol use, unspecified with intoxication, uncomplicated: Secondary | ICD-10-CM

## 2014-08-16 DIAGNOSIS — Z590 Homelessness: Secondary | ICD-10-CM | POA: Insufficient documentation

## 2014-08-16 DIAGNOSIS — F10120 Alcohol abuse with intoxication, uncomplicated: Secondary | ICD-10-CM | POA: Insufficient documentation

## 2014-08-16 LAB — CBC WITH DIFFERENTIAL/PLATELET
BASOS ABS: 0 10*3/uL (ref 0.0–0.1)
BASOS PCT: 1 % (ref 0–1)
EOS ABS: 0.1 10*3/uL (ref 0.0–0.7)
Eosinophils Relative: 1 % (ref 0–5)
HCT: 48.4 % (ref 39.0–52.0)
Hemoglobin: 16 g/dL (ref 13.0–17.0)
Lymphocytes Relative: 28 % (ref 12–46)
Lymphs Abs: 2 10*3/uL (ref 0.7–4.0)
MCH: 31.9 pg (ref 26.0–34.0)
MCHC: 33.1 g/dL (ref 30.0–36.0)
MCV: 96.6 fL (ref 78.0–100.0)
Monocytes Absolute: 0.7 10*3/uL (ref 0.1–1.0)
Monocytes Relative: 10 % (ref 3–12)
NEUTROS PCT: 60 % (ref 43–77)
Neutro Abs: 4.4 10*3/uL (ref 1.7–7.7)
PLATELETS: 161 10*3/uL (ref 150–400)
RBC: 5.01 MIL/uL (ref 4.22–5.81)
RDW: 15.1 % (ref 11.5–15.5)
WBC: 7.3 10*3/uL (ref 4.0–10.5)

## 2014-08-16 LAB — I-STAT CHEM 8, ED
BUN: 5 mg/dL — ABNORMAL LOW (ref 6–20)
Calcium, Ion: 1.17 mmol/L (ref 1.12–1.23)
Chloride: 101 mmol/L (ref 101–111)
Creatinine, Ser: 1.3 mg/dL — ABNORMAL HIGH (ref 0.61–1.24)
Glucose, Bld: 113 mg/dL — ABNORMAL HIGH (ref 70–99)
HEMATOCRIT: 50 % (ref 39.0–52.0)
HEMOGLOBIN: 17 g/dL (ref 13.0–17.0)
Potassium: 3.8 mmol/L (ref 3.5–5.1)
SODIUM: 141 mmol/L (ref 135–145)
TCO2: 22 mmol/L (ref 0–100)

## 2014-08-16 LAB — I-STAT TROPONIN, ED: Troponin i, poc: 0 ng/mL (ref 0.00–0.08)

## 2014-08-16 LAB — ETHANOL: Alcohol, Ethyl (B): 258 mg/dL — ABNORMAL HIGH (ref <5)

## 2014-08-16 MED ORDER — IPRATROPIUM BROMIDE 0.02 % IN SOLN
0.5000 mg | Freq: Once | RESPIRATORY_TRACT | Status: AC
  Administered 2014-08-16: 0.5 mg via RESPIRATORY_TRACT
  Filled 2014-08-16: qty 2.5

## 2014-08-16 MED ORDER — SULFAMETHOXAZOLE-TRIMETHOPRIM 800-160 MG PO TABS: 1.0000 | ORAL_TABLET | Freq: Two times a day (BID) | ORAL | Status: AC

## 2014-08-16 MED ORDER — SULFAMETHOXAZOLE-TRIMETHOPRIM 800-160 MG PO TABS
1.0000 | ORAL_TABLET | Freq: Once | ORAL | Status: AC
  Administered 2014-08-16: 1 via ORAL
  Filled 2014-08-16: qty 1

## 2014-08-16 MED ORDER — CEPHALEXIN 500 MG PO CAPS: 500.0000 mg | ORAL_CAPSULE | Freq: Once | ORAL | Status: DC

## 2014-08-16 MED ORDER — ALBUTEROL SULFATE (2.5 MG/3ML) 0.083% IN NEBU
5.0000 mg | INHALATION_SOLUTION | Freq: Once | RESPIRATORY_TRACT | Status: AC
  Administered 2014-08-16: 5 mg via RESPIRATORY_TRACT
  Filled 2014-08-16: qty 6
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
## 2014-08-16 NOTE — Discharge Instructions (Signed)
Please read and follow all provided instructions.  Your diagnoses today include:  1. Alcohol intoxication, uncomplicated   2. SOB (shortness of breath)   3. Prepatellar bursitis, right     Tests performed today include:  Vital signs. See below for your results today.   Blood counts and electrolytes  X-ray of chest and knee -- no broken bones  Medications prescribed:   Bactrim (trimethoprim/sulfamethoxazole) - antibiotic  You have been prescribed an antibiotic medicine: take the entire course of medicine even if you are feeling better. Stopping early can cause the antibiotic not to work.  Take any prescribed medications only as directed.   Home care instructions:   Follow any educational materials contained in this packet  Follow-up instructions: Return to the Emergency Department or go to [facility] in 48-72 hours for a recheck if your symptoms are not significantly improved.  Please follow-up with your primary care provider in the next 1 week for further evaluation of your symptoms.   Return instructions:  Return to the Emergency Department if you have:  Fever  Worsening symptoms  Worsening pain  Worsening swelling  Redness of the skin that moves away from the affected area, especially if it streaks away from the affected area   Any other emergent concerns   Your vital signs today were: BP 108/72 mmHg   Pulse 79   Temp(Src) 98.2 F (36.8 C) (Oral)   Resp 18   Ht 5\' 8"  (1.727 m)   Wt 285 lb (129.275 kg)   BMI 43.34 kg/m2   SpO2 93% If your blood pressure (BP) was elevated above 135/85 this visit, please have this repeated by your doctor within one month. --------------

## 2014-08-16 NOTE — ED Notes (Signed)
PA at bedside performing [place] of right knee to r/o infection. Pt's acuity level changed accordingly.
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
## 2014-08-16 NOTE — ED Notes (Signed)
Pt arrived to the ED with a complaint of right knee pain.  Pt has an old scab present on the right knee.  Pt is also wishing to get help with detox from alcohol.  Pt also is abusing illegal drugs specifically marijuana and cocaine.  Last ingestion was two hours ago.

## 2014-11-09 ENCOUNTER — Encounter (HOSPITAL_COMMUNITY): Payer: Self-pay | Admitting: *Deleted

## 2014-11-09 ENCOUNTER — Emergency Department (HOSPITAL_COMMUNITY): Payer: PRIVATE HEALTH INSURANCE

## 2014-11-09 ENCOUNTER — Emergency Department (HOSPITAL_COMMUNITY)
Admission: EM | Admit: 2014-11-09 | Discharge: 2014-11-09 | Disposition: A | Discharge status: Home / Self Care | Payer: PRIVATE HEALTH INSURANCE | Attending: Emergency Medicine | Admitting: Emergency Medicine

## 2014-11-09 DIAGNOSIS — Z720 Tobacco use: Secondary | ICD-10-CM | POA: Insufficient documentation

## 2014-11-09 DIAGNOSIS — J441 Chronic obstructive pulmonary disease with (acute) exacerbation: Secondary | ICD-10-CM | POA: Insufficient documentation

## 2014-11-09 DIAGNOSIS — Y9289 Other specified places as the place of occurrence of the external cause: Secondary | ICD-10-CM | POA: Insufficient documentation

## 2014-11-09 DIAGNOSIS — Y998 Other external cause status: Secondary | ICD-10-CM | POA: Insufficient documentation

## 2014-11-09 DIAGNOSIS — Y9389 Activity, other specified: Secondary | ICD-10-CM | POA: Insufficient documentation

## 2014-11-09 DIAGNOSIS — S6991XA Unspecified injury of right wrist, hand and finger(s), initial encounter: Secondary | ICD-10-CM | POA: Insufficient documentation

## 2014-11-09 DIAGNOSIS — Z590 Homelessness: Secondary | ICD-10-CM | POA: Insufficient documentation

## 2014-11-09 DIAGNOSIS — I509 Heart failure, unspecified: Secondary | ICD-10-CM | POA: Insufficient documentation

## 2014-11-09 LAB — CBC WITH DIFFERENTIAL/PLATELET
BASOS ABS: 0 10*3/uL (ref 0.0–0.1)
Basophils Relative: 1 % (ref 0–1)
Eosinophils Absolute: 0.2 10*3/uL (ref 0.0–0.7)
Eosinophils Relative: 3 % (ref 0–5)
HCT: 50.6 % (ref 39.0–52.0)
HEMOGLOBIN: 16 g/dL (ref 13.0–17.0)
LYMPHS ABS: 2.2 10*3/uL (ref 0.7–4.0)
LYMPHS PCT: 31 % (ref 12–46)
MCH: 31.1 pg (ref 26.0–34.0)
MCHC: 31.6 g/dL (ref 30.0–36.0)
MCV: 98.4 fL (ref 78.0–100.0)
Monocytes Absolute: 0.6 10*3/uL (ref 0.1–1.0)
Monocytes Relative: 9 % (ref 3–12)
NEUTROS ABS: 3.9 10*3/uL (ref 1.7–7.7)
Neutrophils Relative %: 56 % (ref 43–77)
Platelets: 178 10*3/uL (ref 150–400)
RBC: 5.14 MIL/uL (ref 4.22–5.81)
RDW: 13.9 % (ref 11.5–15.5)
WBC: 6.9 10*3/uL (ref 4.0–10.5)

## 2014-11-09 LAB — BASIC METABOLIC PANEL
Anion gap: 11 (ref 5–15)
BUN: 14 mg/dL (ref 6–20)
CO2: 24 mmol/L (ref 22–32)
Calcium: 9 mg/dL (ref 8.9–10.3)
Chloride: 103 mmol/L (ref 101–111)
Creatinine, Ser: 0.92 mg/dL (ref 0.61–1.24)
GFR calc Af Amer: >60 mL/min (ref >60)
GFR calc non Af Amer: >60 mL/min (ref >60)
Glucose, Bld: 90 mg/dL (ref 65–99)
POTASSIUM: 4.3 mmol/L (ref 3.5–5.1)
Sodium: 138 mmol/L (ref 135–145)

## 2014-11-09 LAB — I-STAT TROPONIN, ED: TROPONIN I, POC: 0 ng/mL (ref 0.00–0.08)

## 2014-11-09 LAB — HIV ANTIBODY (ROUTINE TESTING W REFLEX): HIV Screen 4th Generation wRfx: NONREACTIVE

## 2014-11-09 NOTE — ED Notes (Signed)
Pt returned from xray   resting

## 2014-11-09 NOTE — Discharge Instructions (Signed)
Needle Stick Injury  A needle stick injury happens when you are stuck by a needle that may have another person's blood on it. Diseases may be passed this way.  HOME CARE  Take medicine exactly as told by your doctor.  Keep all doctor visits as told.  Do not share personal items. GET HELP RIGHT AWAY IF:  You have concerns about your injury, treatment, or follow-up.  The injury is red, puffy (swollen), or painful.  There is yellowish-white fluid (pus) coming from the injury. MAKE SURE YOU:  Understand these instructions.  Will watch your condition.  Will get help right away if you are not doing well or get worse. Document Released: 04/29/2010 Document Revised: 06/19/2011 Document Reviewed: 08/30/2010 [facility] Patient Information 2015 [place], [place]. This information is not intended to replace advice given to you by your health care provider. Make sure you discuss any questions you have with your health care provider.
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
## 2015-04-28 ENCOUNTER — Emergency Department (HOSPITAL_COMMUNITY): Payer: PRIVATE HEALTH INSURANCE

## 2015-04-28 ENCOUNTER — Encounter (HOSPITAL_COMMUNITY): Payer: Self-pay | Admitting: Emergency Medicine

## 2015-04-28 ENCOUNTER — Emergency Department (HOSPITAL_COMMUNITY)
Admission: EM | Admit: 2015-04-28 | Discharge: 2015-04-28 | Discharge status: Against Medical Advice | Payer: PRIVATE HEALTH INSURANCE | Attending: Emergency Medicine | Admitting: Emergency Medicine

## 2015-04-28 DIAGNOSIS — F17210 Nicotine dependence, cigarettes, uncomplicated: Secondary | ICD-10-CM | POA: Insufficient documentation

## 2015-04-28 DIAGNOSIS — J441 Chronic obstructive pulmonary disease with (acute) exacerbation: Secondary | ICD-10-CM | POA: Insufficient documentation

## 2015-04-28 DIAGNOSIS — I509 Heart failure, unspecified: Secondary | ICD-10-CM | POA: Insufficient documentation

## 2015-04-28 LAB — I-STAT TROPONIN, ED: Troponin i, poc: 0.01 ng/mL (ref 0.00–0.08)

## 2015-04-28 LAB — BASIC METABOLIC PANEL
Anion gap: 7 (ref 5–15)
BUN: 14 mg/dL (ref 6–20)
CO2: 31 mmol/L (ref 22–32)
Calcium: 9.6 mg/dL (ref 8.9–10.3)
Chloride: 103 mmol/L (ref 101–111)
Creatinine, Ser: 0.81 mg/dL (ref 0.61–1.24)
GFR calc non Af Amer: >60 mL/min (ref >60)
Glucose, Bld: 143 mg/dL — ABNORMAL HIGH (ref 65–99)
Potassium: 4.5 mmol/L (ref 3.5–5.1)
Sodium: 141 mmol/L (ref 135–145)

## 2015-04-28 LAB — CBC
HCT: 48.9 % (ref 39.0–52.0)
Hemoglobin: 15.9 g/dL (ref 13.0–17.0)
MCH: 31.2 pg (ref 26.0–34.0)
MCHC: 32.5 g/dL (ref 30.0–36.0)
MCV: 96.1 fL (ref 78.0–100.0)
PLATELETS: 213 10*3/uL (ref 150–400)
RBC: 5.09 MIL/uL (ref 4.22–5.81)
RDW: 13 % (ref 11.5–15.5)
WBC: 10.2 10*3/uL (ref 4.0–10.5)

## 2015-04-28 NOTE — ED Notes (Signed)
Pt reports worsening of shortness of breath over the past week. Pt states productive cough also for 1 week. Pt has labored respirations but able to speak in complete sentences.

## 2015-04-28 NOTE — ED Notes (Addendum)
Patient called for repeat vitals 3X with no response

## 2015-08-17 ENCOUNTER — Encounter (HOSPITAL_COMMUNITY): Payer: Self-pay | Admitting: Emergency Medicine

## 2015-08-17 ENCOUNTER — Emergency Department (HOSPITAL_COMMUNITY)
Admission: EM | Admit: 2015-08-17 | Discharge: 2015-08-17 | Disposition: A | Discharge status: Home / Self Care | Payer: Self-pay | Attending: Emergency Medicine | Admitting: Emergency Medicine

## 2015-08-17 ENCOUNTER — Emergency Department (HOSPITAL_COMMUNITY): Payer: Self-pay

## 2015-08-17 DIAGNOSIS — F17210 Nicotine dependence, cigarettes, uncomplicated: Secondary | ICD-10-CM | POA: Insufficient documentation

## 2015-08-17 DIAGNOSIS — R079 Chest pain, unspecified: Secondary | ICD-10-CM | POA: Insufficient documentation

## 2015-08-17 DIAGNOSIS — J441 Chronic obstructive pulmonary disease with (acute) exacerbation: Secondary | ICD-10-CM | POA: Insufficient documentation

## 2015-08-17 DIAGNOSIS — Z590 Homelessness: Secondary | ICD-10-CM | POA: Insufficient documentation

## 2015-08-17 DIAGNOSIS — I509 Heart failure, unspecified: Secondary | ICD-10-CM | POA: Insufficient documentation

## 2015-08-17 DIAGNOSIS — R062 Wheezing: Secondary | ICD-10-CM

## 2015-08-17 LAB — CBC WITH DIFFERENTIAL/PLATELET
BASOS ABS: 0 10*3/uL (ref 0.0–0.1)
BASOS PCT: 1 %
EOS ABS: 0.1 10*3/uL (ref 0.0–0.7)
EOS PCT: 1 %
HCT: 49.3 % (ref 39.0–52.0)
Hemoglobin: 16.2 g/dL (ref 13.0–17.0)
LYMPHS ABS: 1.9 10*3/uL (ref 0.7–4.0)
Lymphocytes Relative: 28 %
MCH: 31 pg (ref 26.0–34.0)
MCHC: 32.9 g/dL (ref 30.0–36.0)
MCV: 94.4 fL (ref 78.0–100.0)
Monocytes Absolute: 0.6 10*3/uL (ref 0.1–1.0)
Monocytes Relative: 9 %
Neutro Abs: 4.1 10*3/uL (ref 1.7–7.7)
Neutrophils Relative %: 61 %
PLATELETS: 226 10*3/uL (ref 150–400)
RBC: 5.22 MIL/uL (ref 4.22–5.81)
RDW: 13.8 % (ref 11.5–15.5)
WBC: 6.7 10*3/uL (ref 4.0–10.5)

## 2015-08-17 LAB — BASIC METABOLIC PANEL
Anion gap: 15 (ref 5–15)
BUN: 10 mg/dL (ref 6–20)
CO2: 26 mmol/L (ref 22–32)
CREATININE: 0.78 mg/dL (ref 0.61–1.24)
Calcium: 9.2 mg/dL (ref 8.9–10.3)
Chloride: 105 mmol/L (ref 101–111)
Glucose, Bld: 116 mg/dL — ABNORMAL HIGH (ref 65–99)
POTASSIUM: 4.2 mmol/L (ref 3.5–5.1)
Sodium: 146 mmol/L — ABNORMAL HIGH (ref 135–145)

## 2015-08-17 LAB — I-STAT TROPONIN, ED
TROPONIN I, POC: 0.01 ng/mL (ref 0.00–0.08)
Troponin i, poc: 0.02 ng/mL (ref 0.00–0.08)

## 2015-08-17 MED ORDER — IPRATROPIUM BROMIDE 0.02 % IN SOLN
1.0000 mg | Freq: Once | RESPIRATORY_TRACT | Status: AC
  Administered 2015-08-17: 1 mg via RESPIRATORY_TRACT
  Filled 2015-08-17: qty 5

## 2015-08-17 MED ORDER — ALBUTEROL (5 MG/ML) CONTINUOUS INHALATION SOLN
10.0000 mg/h | INHALATION_SOLUTION | RESPIRATORY_TRACT | Status: DC
  Administered 2015-08-17: 10 mg/h via RESPIRATORY_TRACT
  Filled 2015-08-17: qty 20

## 2015-08-17 MED ORDER — MAGNESIUM SULFATE 2 GM/50ML IV SOLN
2.0000 g | Freq: Once | INTRAVENOUS | Status: AC
  Administered 2015-08-17: 2 g via INTRAVENOUS
  Filled 2015-08-17: qty 50

## 2015-08-17 MED ORDER — PREDNISONE 20 MG PO TABS: 60.0000 mg | ORAL_TABLET | Freq: Every day | ORAL | Status: DC

## 2015-08-17 MED ORDER — PREDNISONE 20 MG PO TABS
60.0000 mg | ORAL_TABLET | Freq: Once | ORAL | Status: AC
  Administered 2015-08-17: 60 mg via ORAL
  Filled 2015-08-17: qty 3

## 2015-08-17 MED ORDER — SODIUM CHLORIDE 0.9 % IV BOLUS (SEPSIS)
1000.0000 mL | Freq: Once | INTRAVENOUS | Status: AC
  Administered 2015-08-17: 1000 mL via INTRAVENOUS
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
## 2015-08-17 NOTE — Discharge Instructions (Signed)
Bronchospasm, Adult Mr. Brian [name], your blood work and EKG do not show any damage done to your heart.  Continue to take prednisone for your wheezing and see a primary care doctor within 3 days for close follow up. If symptoms worsen, come back to the ED immediately. Thank you. A bronchospasm is when the tubes that carry air in and out of your lungs (airways) spasm or tighten. During a bronchospasm it is hard to breathe. This is because the airways get smaller. A bronchospasm can be triggered by:  Allergies. These may be to animals, pollen, food, or mold.  Infection. This is a common cause of bronchospasm.  Exercise.  Irritants. These include pollution, cigarette smoke, strong odors, aerosol sprays, and paint fumes.  Weather changes.  Stress.  Being emotional. HOME CARE   Always have a plan for getting help. Know when to call your doctor and local emergency services (911 in the [place] Know where you can get emergency care.  Only take medicines as told by your doctor.  If you were prescribed an inhaler or nebulizer machine, ask your doctor how to use it correctly. Always use a spacer with your inhaler if you were given one.  Stay calm during an attack. Try to relax and breathe more slowly.  Control your home environment:  Change your heating and air conditioning filter at least once a month.  Limit your use of fireplaces and wood stoves.  Do not  smoke. Do not  allow smoking in your home.  Avoid perfumes and fragrances.  Get rid of pests (such as roaches and mice) and their droppings.  Throw away plants if you see mold on them.  Keep your house clean and dust free.  Replace carpet with wood, tile, or vinyl flooring. Carpet can trap dander and dust.  Use allergy-proof pillows, mattress covers, and box spring covers.  Wash bed sheets and blankets every week in hot water. Dry them in a dryer.  Use blankets that are made of polyester or cotton.  Wash hands frequently. GET  HELP IF:  You have muscle aches.  You have chest pain.  The thick spit you spit or cough up (sputum) changes from clear or white to yellow, green, gray, or bloody.  The thick spit you spit or cough up gets thicker.  There are problems that may be related to the medicine you are given such as:  A rash.  Itching.  Swelling.  Trouble breathing. GET HELP RIGHT AWAY IF:  You feel you cannot breathe or catch your breath.  You cannot stop coughing.  Your treatment is not helping you breathe better.  You have very bad chest pain. MAKE SURE YOU:   Understand these instructions.  Will watch your condition.  Will get help right away if you are not doing well or get worse.   This information is not intended to replace advice given to you by your health care provider. Make sure you discuss any questions you have with your health care provider.   Document Released: 01/22/2009 Document Revised: 04/17/2014 Document Reviewed: 09/17/2012 Elsevier Interactive Patient Education 2016 [employer] Nonspecific Chest Pain It is often hard to find the cause of chest pain. There is always a chance that your pain could be related to something serious, such as a heart attack or a blood clot in your lungs. Chest pain can also be caused by conditions that are not life-threatening. If you have chest pain, it is very important to follow up with your  doctor.  HOME CARE  If you were prescribed an antibiotic medicine, finish it all even if you start to feel better.  Avoid any activities that cause chest pain.  Do not use any tobacco products, including cigarettes, chewing tobacco, or electronic cigarettes. If you need help quitting, ask your doctor.  Do not drink alcohol.  Take medicines only as told by your doctor.  Keep all follow-up visits as told by your doctor. This is important. This includes any further testing if your chest pain does not go away.  Your doctor may tell you to keep your  head raised (elevated) while you sleep.  Make lifestyle changes as told by your doctor. These may include:  Getting regular exercise. Ask your doctor to suggest some activities that are safe for you.  Eating a heart-healthy diet. Your doctor or a diet specialist (dietitian) can help you to learn healthy eating options.  Maintaining a healthy weight.  Managing diabetes, if necessary.  Reducing stress. GET HELP IF:  Your chest pain does not go away, even after treatment.  You have a rash with blisters on your chest.  You have a fever. GET HELP RIGHT AWAY IF:  Your chest pain is worse.  You have an increasing cough, or you cough up blood.  You have severe belly (abdominal) pain.  You feel extremely weak.  You pass out (faint).  You have chills.  You have sudden, unexplained chest discomfort.  You have sudden, unexplained discomfort in your arms, back, neck, or jaw.  You have shortness of breath at any time.  You suddenly start to sweat, or your skin gets clammy.  You feel nauseous.  You vomit.  You suddenly feel light-headed or dizzy.  Your heart begins to beat quickly, or it feels like it is skipping beats. These symptoms may be an emergency. Do not wait to see if the symptoms will go away. Get medical help right away. Call your local emergency services (911 in the [place] Do not drive yourself to the hospital.   This information is not intended to replace advice given to you by your health care provider. Make sure you discuss any questions you have with your health care provider.   Document Released: 09/13/2007 Document Revised: 04/17/2014 Document Reviewed: 10/31/2013 Elsevier Interactive Patient Education [employer]
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
## 2015-08-17 NOTE — ED Notes (Signed)
Patient transported to X-ray 

## 2015-08-17 NOTE — ED Notes (Signed)
When RN entered room, patient lying to side with breathing treatment removed. Patient quickly reapplied nebulizer when RN spoke with him. Strongly encouraged patient to keep nebulizer treatment on face for maximum benefit. Patient in agreement.

## 2015-09-30 ENCOUNTER — Encounter (HOSPITAL_COMMUNITY): Payer: Self-pay | Admitting: Emergency Medicine

## 2015-09-30 ENCOUNTER — Emergency Department (HOSPITAL_COMMUNITY): Payer: Self-pay

## 2015-09-30 ENCOUNTER — Inpatient Hospital Stay (HOSPITAL_COMMUNITY)
Admission: EM | Admit: 2015-09-30 | Discharge: 2015-10-07 | DRG: 190 | Disposition: A | Discharge status: Home / Self Care | Payer: Self-pay | Attending: Internal Medicine | Admitting: Internal Medicine

## 2015-09-30 ENCOUNTER — Other Ambulatory Visit: Payer: Self-pay

## 2015-09-30 DIAGNOSIS — I160 Hypertensive urgency: Secondary | ICD-10-CM | POA: Diagnosis present

## 2015-09-30 DIAGNOSIS — J441 Chronic obstructive pulmonary disease with (acute) exacerbation: Secondary | ICD-10-CM | POA: Diagnosis present

## 2015-09-30 DIAGNOSIS — F10239 Alcohol dependence with withdrawal, unspecified: Secondary | ICD-10-CM | POA: Diagnosis present

## 2015-09-30 DIAGNOSIS — J189 Pneumonia, unspecified organism: Secondary | ICD-10-CM | POA: Diagnosis present

## 2015-09-30 DIAGNOSIS — E872 Acidosis: Secondary | ICD-10-CM | POA: Diagnosis present

## 2015-09-30 DIAGNOSIS — I10 Essential (primary) hypertension: Secondary | ICD-10-CM | POA: Diagnosis present

## 2015-09-30 DIAGNOSIS — R0600 Dyspnea, unspecified: Secondary | ICD-10-CM

## 2015-09-30 DIAGNOSIS — J9602 Acute respiratory failure with hypercapnia: Secondary | ICD-10-CM | POA: Diagnosis present

## 2015-09-30 DIAGNOSIS — E669 Obesity, unspecified: Secondary | ICD-10-CM | POA: Diagnosis present

## 2015-09-30 DIAGNOSIS — J9601 Acute respiratory failure with hypoxia: Secondary | ICD-10-CM | POA: Diagnosis present

## 2015-09-30 DIAGNOSIS — F17210 Nicotine dependence, cigarettes, uncomplicated: Secondary | ICD-10-CM | POA: Diagnosis present

## 2015-09-30 DIAGNOSIS — Z590 Homelessness: Secondary | ICD-10-CM

## 2015-09-30 DIAGNOSIS — Z7952 Long term (current) use of systemic steroids: Secondary | ICD-10-CM

## 2015-09-30 DIAGNOSIS — F1020 Alcohol dependence, uncomplicated: Secondary | ICD-10-CM | POA: Diagnosis present

## 2015-09-30 DIAGNOSIS — J440 Chronic obstructive pulmonary disease with acute lower respiratory infection: Principal | ICD-10-CM | POA: Diagnosis present

## 2015-09-30 DIAGNOSIS — Z6838 Body mass index (BMI) 38.0-38.9, adult: Secondary | ICD-10-CM

## 2015-09-30 LAB — POCT I-STAT 3, ART BLOOD GAS (G3+)
Bicarbonate: 28.1 mEq/L — ABNORMAL HIGH (ref 20.0–24.0)
O2 Saturation: 90 %
PCO2 ART: 59.8 mmHg — AB (ref 35.0–45.0)
PH ART: 7.28 — AB (ref 7.350–7.450)
PO2 ART: 66 mmHg — AB (ref 80.0–100.0)
Patient temperature: 98.3
TCO2: 30 mmol/L (ref 0–100)

## 2015-09-30 LAB — CBC WITH DIFFERENTIAL/PLATELET
Basophils Absolute: 0 10*3/uL (ref 0.0–0.1)
Basophils Relative: 0 %
EOS ABS: 0 10*3/uL (ref 0.0–0.7)
Eosinophils Relative: 0 %
HEMATOCRIT: 52.3 % — AB (ref 39.0–52.0)
HEMOGLOBIN: 17.3 g/dL — AB (ref 13.0–17.0)
LYMPHS ABS: 1.4 10*3/uL (ref 0.7–4.0)
Lymphocytes Relative: 12 %
MCH: 31.7 pg (ref 26.0–34.0)
MCHC: 33.1 g/dL (ref 30.0–36.0)
MCV: 95.8 fL (ref 78.0–100.0)
MONOS PCT: 16 %
Monocytes Absolute: 1.8 10*3/uL — ABNORMAL HIGH (ref 0.1–1.0)
NEUTROS ABS: 8.3 10*3/uL — AB (ref 1.7–7.7)
NEUTROS PCT: 72 %
Platelets: 179 10*3/uL (ref 150–400)
RBC: 5.46 MIL/uL (ref 4.22–5.81)
RDW: 14.1 % (ref 11.5–15.5)
WBC: 11.5 10*3/uL — ABNORMAL HIGH (ref 4.0–10.5)

## 2015-09-30 LAB — COMPREHENSIVE METABOLIC PANEL
ALK PHOS: 47 U/L (ref 38–126)
ALT: 53 U/L (ref 17–63)
ANION GAP: 10 (ref 5–15)
AST: 38 U/L (ref 15–41)
Albumin: 4.2 g/dL (ref 3.5–5.0)
BILIRUBIN TOTAL: 1.2 mg/dL (ref 0.3–1.2)
BUN: 10 mg/dL (ref 6–20)
CALCIUM: 9.3 mg/dL (ref 8.9–10.3)
CO2: 26 mmol/L (ref 22–32)
Chloride: 102 mmol/L (ref 101–111)
Creatinine, Ser: 0.77 mg/dL (ref 0.61–1.24)
Glucose, Bld: 108 mg/dL — ABNORMAL HIGH (ref 65–99)
Potassium: 5.1 mmol/L (ref 3.5–5.1)
Sodium: 138 mmol/L (ref 135–145)
TOTAL PROTEIN: 7.7 g/dL (ref 6.5–8.1)

## 2015-09-30 LAB — I-STAT ARTERIAL BLOOD GAS, ED
ACID-BASE EXCESS: 2 mmol/L (ref 0.0–2.0)
ACID-BASE EXCESS: 1 mmol/L (ref 0.0–2.0)
BICARBONATE: 32 meq/L — AB (ref 20.0–24.0)
Bicarbonate: 30.3 mEq/L — ABNORMAL HIGH (ref 20.0–24.0)
O2 SAT: 96 %
O2 Saturation: 100 %
PCO2 ART: 68.7 mmHg — AB (ref 35.0–45.0)
PH ART: 7.282 — AB (ref 7.350–7.450)
PO2 ART: 214 mmHg — AB (ref 80.0–100.0)
Patient temperature: 98.1
TCO2: 34 mmol/L (ref 0–100)
TCO2: 32 mmol/L (ref 0–100)
pCO2 arterial: 64.1 mmHg (ref 35.0–45.0)
pH, Arterial: 7.275 — ABNORMAL LOW (ref 7.350–7.450)
pO2, Arterial: 95 mmHg (ref 80.0–100.0)

## 2015-09-30 LAB — I-STAT TROPONIN, ED: TROPONIN I, POC: 0 ng/mL (ref 0.00–0.08)

## 2015-09-30 LAB — LIPASE, BLOOD: Lipase: 19 U/L (ref 11–51)

## 2015-09-30 LAB — BRAIN NATRIURETIC PEPTIDE: B NATRIURETIC PEPTIDE 5: 64.8 pg/mL (ref 0.0–100.0)

## 2015-09-30 LAB — I-STAT CG4 LACTIC ACID, ED: LACTIC ACID, VENOUS: 0.92 mmol/L (ref 0.5–2.0)

## 2015-09-30 LAB — GLUCOSE, CAPILLARY: GLUCOSE-CAPILLARY: 299 mg/dL — AB (ref 65–99)

## 2015-09-30 LAB — ETHANOL: Alcohol, Ethyl (B): <5 mg/dL (ref <5)

## 2015-09-30 MED ORDER — ALBUTEROL SULFATE (2.5 MG/3ML) 0.083% IN NEBU
10.0000 mg | INHALATION_SOLUTION | Freq: Once | RESPIRATORY_TRACT | Status: AC
  Administered 2015-09-30: 10 mg via RESPIRATORY_TRACT
  Filled 2015-09-30: qty 12

## 2015-09-30 MED ORDER — INSULIN ASPART 100 UNIT/ML ~~LOC~~ SOLN
0.0000 [IU] | Freq: Every day | SUBCUTANEOUS | Status: DC
  Administered 2015-09-30: 3 [IU] via SUBCUTANEOUS
  Administered 2015-10-01: 2 [IU] via SUBCUTANEOUS

## 2015-09-30 MED ORDER — KETAMINE HCL-SODIUM CHLORIDE 100-0.9 MG/10ML-% IV SOSY: 0.3000 mg/kg | PREFILLED_SYRINGE | Freq: Once | INTRAVENOUS | Status: DC

## 2015-09-30 MED ORDER — THIAMINE HCL 100 MG/ML IJ SOLN
100.0000 mg | Freq: Every day | INTRAMUSCULAR | Status: DC
  Administered 2015-09-30 – 2015-10-04 (×5): 100 mg via INTRAVENOUS
  Filled 2015-09-30 (×6): qty 2

## 2015-09-30 MED ORDER — DEXTROSE 5 % IV SOLN
1.0000 g | Freq: Once | INTRAVENOUS | Status: AC
  Administered 2015-09-30: 1 g via INTRAVENOUS
  Filled 2015-09-30: qty 10

## 2015-09-30 MED ORDER — FOLIC ACID 5 MG/ML IJ SOLN
1.0000 mg | Freq: Every day | INTRAMUSCULAR | Status: DC
  Administered 2015-09-30 – 2015-10-02 (×3): 1 mg via INTRAVENOUS
  Filled 2015-09-30 (×4): qty 0.2

## 2015-09-30 MED ORDER — DEXTROSE 5 % IV SOLN
1.0000 g | INTRAVENOUS | Status: DC
  Administered 2015-10-01 – 2015-10-06 (×6): 1 g via INTRAVENOUS
  Filled 2015-09-30 (×8): qty 10

## 2015-09-30 MED ORDER — IPRATROPIUM BROMIDE 0.02 % IN SOLN
1.0000 mg | Freq: Once | RESPIRATORY_TRACT | Status: AC
  Administered 2015-09-30: 1 mg via RESPIRATORY_TRACT
  Filled 2015-09-30: qty 5

## 2015-09-30 MED ORDER — METHYLPREDNISOLONE SODIUM SUCC 125 MG IJ SOLR
60.0000 mg | Freq: Two times a day (BID) | INTRAMUSCULAR | Status: DC
  Administered 2015-09-30 – 2015-10-02 (×5): 60 mg via INTRAVENOUS
  Filled 2015-09-30 (×5): qty 2

## 2015-09-30 MED ORDER — ALBUTEROL SULFATE (2.5 MG/3ML) 0.083% IN NEBU: 2.5000 mg | INHALATION_SOLUTION | RESPIRATORY_TRACT | Status: DC | PRN

## 2015-09-30 MED ORDER — CETYLPYRIDINIUM CHLORIDE 0.05 % MT LIQD
7.0000 mL | Freq: Two times a day (BID) | OROMUCOSAL | Status: DC
  Administered 2015-10-01 – 2015-10-07 (×10): 7 mL via OROMUCOSAL

## 2015-09-30 MED ORDER — IPRATROPIUM-ALBUTEROL 0.5-2.5 (3) MG/3ML IN SOLN
3.0000 mL | Freq: Four times a day (QID) | RESPIRATORY_TRACT | Status: DC
  Administered 2015-09-30 – 2015-10-02 (×9): 3 mL via RESPIRATORY_TRACT
  Filled 2015-09-30 (×8): qty 3

## 2015-09-30 MED ORDER — SODIUM CHLORIDE 0.9 % IV BOLUS (SEPSIS)
500.0000 mL | Freq: Once | INTRAVENOUS | Status: AC
  Administered 2015-09-30: 500 mL via INTRAVENOUS

## 2015-09-30 MED ORDER — INSULIN ASPART 100 UNIT/ML ~~LOC~~ SOLN
0.0000 [IU] | Freq: Three times a day (TID) | SUBCUTANEOUS | Status: DC
  Administered 2015-10-01: 3 [IU] via SUBCUTANEOUS
  Administered 2015-10-01 (×2): 4 [IU] via SUBCUTANEOUS
  Administered 2015-10-02: 7 [IU] via SUBCUTANEOUS
  Administered 2015-10-02 – 2015-10-03 (×4): 3 [IU] via SUBCUTANEOUS
  Administered 2015-10-03: 4 [IU] via SUBCUTANEOUS
  Administered 2015-10-04: 3 [IU] via SUBCUTANEOUS
  Administered 2015-10-05 – 2015-10-06 (×2): 4 [IU] via SUBCUTANEOUS

## 2015-09-30 MED ORDER — PNEUMOCOCCAL VAC POLYVALENT 25 MCG/0.5ML IJ INJ
0.5000 mL | INJECTION | INTRAMUSCULAR | Status: DC
  Filled 2015-09-30 (×2): qty 0.5

## 2015-09-30 MED ORDER — DEXTROSE 5 % IV SOLN
500.0000 mg | Freq: Once | INTRAVENOUS | Status: AC
  Administered 2015-09-30: 500 mg via INTRAVENOUS
  Filled 2015-09-30: qty 500

## 2015-09-30 MED ORDER — DEXTROSE 5 % IV SOLN
500.0000 mg | INTRAVENOUS | Status: DC
  Administered 2015-10-01 – 2015-10-03 (×3): 500 mg via INTRAVENOUS
  Filled 2015-09-30 (×4): qty 500

## 2015-09-30 MED ORDER — ENOXAPARIN SODIUM 40 MG/0.4ML ~~LOC~~ SOLN
40.0000 mg | SUBCUTANEOUS | Status: DC
  Administered 2015-09-30 – 2015-10-06 (×7): 40 mg via SUBCUTANEOUS
  Filled 2015-09-30 (×7): qty 0.4

## 2015-09-30 NOTE — ED Provider Notes (Signed)
CSN: [id number]     Arrival date & time 09/30/15  1624 History   First MD Initiated Contact with Patient 09/30/15 1624     Chief Complaint  Patient presents with  . Shortness of Breath    HPI Comments: 60 y.o. Male with a history of COPD, CHF, alcohol abuse, cocaine abuse presents to the ED due to respiratory distress. Has been out of his inhaler for a week, states someone stole it, and has been short of breath for a week. Associated with productive cough, chest pain, abdominal pain. With EMS, he received albuterol, atrovent, solumedrol, and nitroglycerin. He arrived on a CPAP with EMS.   Patient is a 60 y.o. male presenting with shortness of breath. The history is provided by the patient and the EMS personnel.  Shortness of Breath Severity:  Severe Onset quality:  Gradual Duration:  1 week Timing:  Constant Progression:  Worsening Chronicity:  New Context comment:  Has been out of his inhaler Relieved by:  None tried Worsened by:  Nothing tried Ineffective treatments:  None tried Associated symptoms: abdominal pain, chest pain, cough and sputum production   Abdominal pain:    Location:  Generalized   Quality:  Unable to specify   Onset quality:  Gradual   Duration:  1 week   Timing:  Unable to specify   Progression:  Unable to specify Chest pain:    Chest pain quality: "From coughing"   Severity:  Mild   Onset quality:  Gradual   Duration:  1 week Cough:    Cough characteristics:  Productive   Sputum characteristics:  Yellow   Severity:  Severe   Onset quality:  Gradual   Duration:  1 week   Timing:  Unable to specify   Progression:  Unable to specify Risk factors: alcohol use   Risk factors comment:  Hx of COPD and CHF   Past Medical History  Diagnosis Date  . Heart failure   . Arthritis   . Alcohol abuse     Heavy alcohol abuse and homelessness  . Tobacco abuse   . Cocaine abuse     And crack   . Homelessness   . COPD (chronic obstructive pulmonary disease)  (HCC)   . Suicide attempt (HCC)   . CHF (congestive heart failure) [facility])    Past Surgical History  Procedure Laterality Date  . Inner ear surgery     Family History  Problem Relation Age of Onset  . Coronary artery disease    . Diabetes     Social History  Substance Use Topics  . Smoking status: Current Every Day Smoker -- 2.00 packs/day    Types: Cigarettes  . Smokeless tobacco: Never Used  . Alcohol Use: Yes     Comment: Heavy. 5-10 40oz daily    Review of Systems  Unable to perform ROS: Severe respiratory distress  Respiratory: Positive for cough, sputum production and shortness of breath.   Cardiovascular: Positive for chest pain.  Gastrointestinal: Positive for abdominal pain.      Allergies  Review of patient's allergies indicates no known allergies.  Home Medications   Prior to Admission medications   Medication Sig Start Date End Date Taking? Authorizing Provider  predniSONE (DELTASONE) 20 MG tablet Take 3 tablets (60 mg total) by mouth daily. 08/17/15   Tomasita Crumble, MD   BP 159/115 mmHg  Pulse 126  Temp(Src) 98.1 F (36.7 C) (Oral)  Ht  (1.651 m)  Wt 83.915 kg  BMI  30.79 kg/m2  SpO2 100% Physical Exam  Constitutional: He appears well-developed and well-nourished. No distress.  HENT:  Head: Normocephalic and atraumatic.  Right Ear: External ear normal.  Left Ear: External ear normal.  Nose: Nose normal.  Neck: Normal range of motion.  Cardiovascular: Regular rhythm.  Tachycardia present.   Pulses:      Radial pulses are 2+ on the right side, and 2+ on the left side.       Posterior tibial pulses are 2+ on the right side, and 2+ on the left side.  No peripheral edema  Pulmonary/Chest: He is in respiratory distress. He has decreased breath sounds in the right upper field, the right middle field, the right lower field, the left upper field, the left middle field and the left lower field.  Severely diminished in all fields, no wheezes or rales  heard; tachypneic  Abdominal: Soft. He exhibits no distension. There is no tenderness.  Neurological: He is alert.  Face symmetric, moves all extremities spontaneously  Skin: Skin is warm and dry. He is not diaphoretic.  Psychiatric: He has a normal mood and affect.  Vitals reviewed.   ED Course  Procedures  Labs Review Labs Reviewed  CBC WITH DIFFERENTIAL/PLATELET - Abnormal; Notable for the following:    WBC 11.5 (*)    Hemoglobin 17.3 (*)    HCT 52.3 (*)    Neutro Abs 8.3 (*)    Monocytes Absolute 1.8 (*)    All other components within normal limits  COMPREHENSIVE METABOLIC PANEL - Abnormal; Notable for the following:    Glucose, Bld 108 (*)    All other components within normal limits  I-STAT ARTERIAL BLOOD GAS, ED - Abnormal; Notable for the following:    pH, Arterial 7.275 (*)    pCO2 arterial 68.7 (*)    pO2, Arterial 214.0 (*)    Bicarbonate 32.0 (*)    All other components within normal limits  I-STAT ARTERIAL BLOOD GAS, ED - Abnormal; Notable for the following:    pH, Arterial 7.282 (*)    pCO2 arterial 64.1 (*)    Bicarbonate 30.3 (*)    All other components within normal limits  CULTURE, BLOOD (ROUTINE X 2)  CULTURE, BLOOD (ROUTINE X 2)  BRAIN NATRIURETIC PEPTIDE  LIPASE, BLOOD  ETHANOL  I-STAT CG4 LACTIC ACID, ED  I-STAT TROPOININ, ED  I-STAT CG4 LACTIC ACID, ED    Imaging Review Dg Chest Portable 1 View  09/30/2015  CLINICAL DATA:  Short of breath for 1 week EXAM: PORTABLE CHEST 1 VIEW COMPARISON:  08/17/2015 FINDINGS: Normal heart size. Lungs are hyperaerated. Minimal patchy density at the left base. No pneumothorax. Normal vascularity. IMPRESSION: Mild patchy airspace disease at the left base. Followup PA and lateral chest X-ray is recommended in 3-4 weeks following trial of antibiotic therapy to ensure resolution and exclude underlying malignancy. Electronically Signed   By: Jolaine Click M.D.   On: 09/30/2015 16:57   I have personally reviewed and  evaluated these images and lab results as part of my medical decision-making.   EKG Interpretation   Date/Time:  Thursday September 30 2015 16:26:39 EDT Ventricular Rate:  126 PR Interval:    QRS Duration: 81 QT Interval:  313 QTC Calculation: 454 R Axis:   75 Text Interpretation:  Sinus tachycardia Probable left atrial enlargement  Borderline repol abnormality, diffuse leads Abnormal ekg Confirmed by  Gerhard Munch  MD [phone]) on 09/30/2015 4:30:14 PM      MDM   Final  diagnoses:  CAP (community acquired pneumonia)  Acute respiratory acidosis    60 y.o. male presents with dyspnea x 1 week. Remainder of history of present illness, review of systems, exam as above. Exam notable for an older appearing male in moderate respiratory distress. He is able to speak in 3-4 word sentences. He adamantly refuses to wear the BiPAP. Initial blood gas shows a respiratory acidosis. Continuous nebulizer has been ordered for him and is applied. Chest x-ray consistent with pneumonia. Antibiotics to cover for community acquired pneumonia were ordered for the patient. Cultures obtained prior to this.  On reassessment the patient continues to [facility] well. His repeat ABG shows no significant improvement with the continuous nebulizer treatment. I discussed this with the patient and he is willing to wear the BiPAP now. On reassessment with the BiPAP, he continues to be tachypneic, however is not using any accessory muscles.  CBC showed mild leukocytosis. CMP unremarkable. BNP and troponin are unremarkable.  I discussed his case with the hospitalist and he was admitted to the stepdown unit.  Case managed in conjunction with my attending, Dr. Jeraldine Loots.    Maxine Glenn, MD 09/30/15 1191  Gerhard Munch, MD 09/30/15 [phone]

## 2015-09-30 NOTE — ED Notes (Signed)
Spoke with phlebotomy to send ethanol to lab.

## 2015-09-30 NOTE — Progress Notes (Signed)
ABG collected  

## 2015-09-30 NOTE — H&P (Signed)
History and Physical  Brian [name] WGN:[id number] DOB: 1955-12-21 DOA: 09/30/2015  Referring physician: Dr Marjorie Smolder, ED resident PCP: [name] Patient  Outpatient Specialists: None  Chief Complaint: SOB  HPI: Brian [name] is a 60 y.o. male with a history of COPD, chronic alcohol use, Polysubstance abuse, homelessness. Patient seen for shortness of breath that started about a week ago and has been worsening. Worse with ambulation to the point of only being able to ambulate 10 feet  Before becoming extremely short of breath. Rest improved symptoms, as did C Pap and BiPAP. He does have a cough that is productive with yellow sputum. Denies fevers, chills, nausea, vomiting. Chronic smoker.  Patient is a chronic alcohol user and reports 1-2 beers a day. States that he used to drink much more than this, but has cut this down. Last Drink was yesterday.  Does get the shakes if does not have alcohol for day or 2.   Review of Systems:   Pt denies any fevers, chills, nausea, vomiting, diarrhea, constipation, abdominal pain, orthopnea, wheezing, palpitations, headache, vision changes, lightheadedness, dizziness, melena, rectal bleeding.  Review of systems are otherwise negative  Past Medical History  Diagnosis Date  . Heart failure   . Arthritis   . Alcohol abuse     Heavy alcohol abuse and homelessness  . Tobacco abuse   . Cocaine abuse     And crack   . Homelessness   . COPD (chronic obstructive pulmonary disease) (HCC)   . Suicide attempt (HCC)   . CHF (congestive heart failure) [facility])    Past Surgical History  Procedure Laterality Date  . Inner ear surgery     Social History:  reports that he has been smoking Cigarettes.  He has been smoking about 2.00 packs per day. He has never used smokeless tobacco. He reports that he drinks alcohol. He reports that he does not use illicit drugs. Patient lives On the streets  No Known Allergies  Family History  Problem Relation Age  of Onset  . Coronary artery disease    . Diabetes    . Cancer Sister     Prior to Admission medications   Not on File    Physical Exam: BP 128/86 mmHg  Pulse 116  Temp(Src) 98.1 F (36.7 C) (Oral)  Resp 32  Ht  (1.651 m)  Wt 83.915 kg (185 lb)  BMI 30.79 kg/m2  SpO2 94%  General: Middle-aged Caucasian male. Awake and alert and oriented x3. No acute cardiopulmonary distress.  HEENT: Normocephalic atraumatic.  Right and left ears normal in appearance.  Pupils equal, round, reactive to light. Extraocular muscles are intact. Sclerae anicteric and noninjected.  Moist mucosal membranes. Neck: Neck supple without lymphadenopathy. No carotid bruits. No masses palpated.  Cardiovascular: Regular rate with normal S1-S2 sounds. No murmurs, rubs, gallops auscultated. No JVD.  Respiratory: Poor respiratory effort. Diffuse diminished breath sounds. Rales in the bases. Abdomen: Soft, nontender, nondistended. Active bowel sounds. No masses or hepatosplenomegaly  Skin: No rashes, lesions, or ulcerations.  Dry, warm to touch. 2+ dorsalis pedis and radial pulses. Musculoskeletal: No calf or leg pain. All major joints not erythematous nontender.  No upper or lower joint deformation.  Good ROM.  No contractures  Psychiatric: Intact judgment and insight. Pleasant and cooperative. Neurologic: No focal neurological deficits. Strength is 5/5 and symmetric in upper and lower extremities.  Cranial nerves II through XII are grossly intact.  Labs on Admission: I have personally reviewed following labs and imaging studies  CBC:  Recent Labs Lab 09/30/15 1635  WBC 11.5*  NEUTROABS 8.3*  HGB 17.3*  HCT 52.3*  MCV 95.8  PLT 179   Basic Metabolic Panel:  Recent Labs Lab 09/30/15 1635  NA 138  K 5.1  CL 102  CO2 26  GLUCOSE 108*  BUN 10  CREATININE 0.77  CALCIUM 9.3   GFR: Estimated Creatinine Clearance: 101.6 mL/min (by C-G formula based on Cr of 0.77). Liver Function  Tests:  Recent Labs Lab 09/30/15 1635  AST 38  ALT 53  ALKPHOS 47  BILITOT 1.2  PROT 7.7  ALBUMIN 4.2    Recent Labs Lab 09/30/15 1635  LIPASE 19   No results for input(s): AMMONIA in the last 168 hours. Coagulation Profile: No results for input(s): INR, PROTIME in the last 168 hours. Cardiac Enzymes: No results for input(s): CKTOTAL, CKMB, CKMBINDEX, TROPONINI in the last 168 hours. BNP (last 3 results) No results for input(s): PROBNP in the last 8760 hours. HbA1C: No results for input(s): HGBA1C in the last 72 hours. CBG: No results for input(s): GLUCAP in the last 168 hours. Lipid Profile: No results for input(s): CHOL, HDL, LDLCALC, TRIG, CHOLHDL, LDLDIRECT in the last 72 hours. Thyroid Function Tests: No results for input(s): TSH, T4TOTAL, FREET4, T3FREE, THYROIDAB in the last 72 hours. Anemia Panel: No results for input(s): VITAMINB12, FOLATE, FERRITIN, TIBC, IRON, RETICCTPCT in the last 72 hours. Urine analysis:    Component Value Date/Time   COLORURINE Yellow 10/10/2012 0320   COLORURINE YELLOW 08/27/2012 0659   APPEARANCEUR Clear 10/10/2012 0320   APPEARANCEUR CLEAR 08/27/2012 0659   LABSPEC 1.009 10/10/2012 0320   LABSPEC 1.006 08/27/2012 0659   PHURINE 5.0 10/10/2012 0320   PHURINE 5.5 08/27/2012 0659   GLUCOSEU Negative 10/10/2012 0320   GLUCOSEU NEGATIVE 08/27/2012 0659   HGBUR Negative 10/10/2012 0320   HGBUR NEGATIVE 08/27/2012 0659   BILIRUBINUR Negative 10/10/2012 0320   BILIRUBINUR NEGATIVE 08/27/2012 0659   KETONESUR Negative 10/10/2012 0320   KETONESUR NEGATIVE 08/27/2012 0659   PROTEINUR Negative 10/10/2012 0320   PROTEINUR NEGATIVE 08/27/2012 0659   UROBILINOGEN 0.2 08/27/2012 0659   NITRITE Negative 10/10/2012 0320   NITRITE NEGATIVE 08/27/2012 0659   LEUKOCYTESUR Negative 10/10/2012 0320   LEUKOCYTESUR NEGATIVE 08/27/2012 0659   Sepsis Labs: @LABRCNTIP (procalcitonin:4,lacticidven:4) )No results found for this or any previous  visit (from the past 240 hour(s)).   Radiological Exams on Admission: Dg Chest Portable 1 View  09/30/2015  CLINICAL DATA:  Short of breath for 1 week EXAM: PORTABLE CHEST 1 VIEW COMPARISON:  08/17/2015 FINDINGS: Normal heart size. Lungs are hyperaerated. Minimal patchy density at the left base. No pneumothorax. Normal vascularity. IMPRESSION: Mild patchy airspace disease at the left base. Followup PA and lateral chest X-ray is recommended in 3-4 weeks following trial of antibiotic therapy to ensure resolution and exclude underlying malignancy. Electronically Signed   By: Jolaine Click M.D.   On: 09/30/2015 16:57    EKG: Independently reviewed.Sinus tachycardia with left atrial enlargement. Borderline repolarization abnormality in the lateral leads. No acute ST elevation or depression.  Assessment/Plan: Principal Problem:   Acute respiratory failure with hypoxia and hypercarbia (HCC) Active Problems:   Alcohol dependence (HCC)   CAP (community acquired pneumonia)   COPD with acute exacerbation (HCC)    This patient was discussed with the ED physician, including pertinent vitals, physical exam findings, labs, and imaging.  We also discussed care given  by the ED provider.  #1 acute respiratory failure with hypoxia and hypercarbia #2 community-acquired pneumonia #3 COPD with acute exacerbation #4 alcohol dependence  Admit to stepdown on  BiPAP  We'll repeat ABG in 3 hours  Ceftriaxone  And azithromycin IV  Continue Solu-Medrol: 60 mg IV 3 times a day  Continue nebulizer treatments  Repeat CBC in the morning  Strep antigen by urine  Sputum culture  Blood cultures done  CIWA protocol  Thiamine and folic acid daily  DVT prophylaxis: Lovenox Consultants: None Code Status: Full code Family Communication: None  Disposition Plan: Stepdown admission   Levie Heritage, DO [facility] Pager [phone]  If 7PM-7AM, please contact night-coverage [email or web address] Password  [email or web address]

## 2015-09-30 NOTE — Progress Notes (Signed)
Pt is on NIV at this time tolerating it well. RT will monitor

## 2015-09-30 NOTE — ED Notes (Signed)
Patient picked up by EMS for SOB, onset of 1 week. Patient SOB on arrival, put on CPAP by EMS. Patient received 10 mg Albuterol, 125 mg Solumed, 0.5 Atrovent, 1 Nitro. 20 G IV LFA. Patient arrived to ED alert and oriented x 4. No wheezes heard, patient c/o chest pain/tightness. States he hasn't had inhalers, has been coughing for 1 week. BP 159/115, HR 126, 98.1 F, 26 resp.

## 2015-09-30 NOTE — Progress Notes (Signed)
Pt arrived on CPAP via EMS. RT attempted to place patient on BIPAP. Multiple attempts to make patient comfortable on BIPAP with 2 different sixe mask. Pt stated he could not wear it, he felt like he was smothering. RT removed BIPAP and placed on NRB mask briefly before starting CAT neb with Albuterol 10mg  and Atrovent 1 mg over 1 hour. Will redraw ABG after CAT and assess the needs for BIPAP. Will cont to monitor

## 2015-09-30 NOTE — Progress Notes (Signed)
RN made aware of ABG results. 

## 2015-09-30 NOTE — Progress Notes (Signed)
Inform patient and educated pt, of the blood result values and made him aware that it is essential to wear the NIV mask for the night. Pt verbalize understanding and agreed. Pt is eating and drinking currently at this time. Will come back next rounds to assess readiness for NIV. RN aware. Pt is stable at this time no signs of respiratory distress noted.

## 2015-10-01 DIAGNOSIS — F10239 Alcohol dependence with withdrawal, unspecified: Secondary | ICD-10-CM

## 2015-10-01 LAB — BASIC METABOLIC PANEL
ANION GAP: 5 (ref 5–15)
BUN: 16 mg/dL (ref 6–20)
CHLORIDE: 103 mmol/L (ref 101–111)
CO2: 32 mmol/L (ref 22–32)
CREATININE: 0.82 mg/dL (ref 0.61–1.24)
Calcium: 9.6 mg/dL (ref 8.9–10.3)
GFR calc non Af Amer: >60 mL/min (ref >60)
Glucose, Bld: 218 mg/dL — ABNORMAL HIGH (ref 65–99)
Potassium: 4.9 mmol/L (ref 3.5–5.1)
SODIUM: 140 mmol/L (ref 135–145)

## 2015-10-01 LAB — CBC
HCT: 51.4 % (ref 39.0–52.0)
HEMOGLOBIN: 16.4 g/dL (ref 13.0–17.0)
MCH: 31.1 pg (ref 26.0–34.0)
MCHC: 31.9 g/dL (ref 30.0–36.0)
MCV: 97.5 fL (ref 78.0–100.0)
PLATELETS: 175 10*3/uL (ref 150–400)
RBC: 5.27 MIL/uL (ref 4.22–5.81)
RDW: 14 % (ref 11.5–15.5)
WBC: 7.7 10*3/uL (ref 4.0–10.5)

## 2015-10-01 LAB — POCT I-STAT 3, ART BLOOD GAS (G3+)
Acid-Base Excess: 5 mmol/L — ABNORMAL HIGH (ref 0.0–2.0)
Bicarbonate: 32.6 mEq/L — ABNORMAL HIGH (ref 20.0–24.0)
O2 Saturation: 89 %
PCO2 ART: 58.8 mmHg — AB (ref 35.0–45.0)
PH ART: 7.349 — AB (ref 7.350–7.450)
PO2 ART: 61 mmHg — AB (ref 80.0–100.0)
Patient temperature: 97.7
TCO2: 34 mmol/L (ref 0–100)

## 2015-10-01 LAB — MRSA PCR SCREENING: MRSA BY PCR: NEGATIVE

## 2015-10-01 LAB — GLUCOSE, CAPILLARY
GLUCOSE-CAPILLARY: 155 mg/dL — AB (ref 65–99)
GLUCOSE-CAPILLARY: 190 mg/dL — AB (ref 65–99)
GLUCOSE-CAPILLARY: 207 mg/dL — AB (ref 65–99)
Glucose-Capillary: 123 mg/dL — ABNORMAL HIGH (ref 65–99)

## 2015-10-01 LAB — STREP PNEUMONIAE URINARY ANTIGEN: Strep Pneumo Urinary Antigen: NEGATIVE

## 2015-10-01 LAB — HIV ANTIBODY (ROUTINE TESTING W REFLEX): HIV Screen 4th Generation wRfx: NONREACTIVE

## 2015-10-01 MED ORDER — LORAZEPAM 2 MG/ML IJ SOLN
1.0000 mg | INTRAMUSCULAR | Status: DC | PRN
  Administered 2015-10-01 – 2015-10-02 (×6): 1 mg via INTRAVENOUS
  Filled 2015-10-01 (×7): qty 1

## 2015-10-01 MED ORDER — TRAMADOL HCL 50 MG PO TABS
50.0000 mg | ORAL_TABLET | Freq: Once | ORAL | Status: AC
  Administered 2015-10-01: 50 mg via ORAL
  Filled 2015-10-01: qty 1

## 2015-10-01 MED ORDER — KETOROLAC TROMETHAMINE 30 MG/ML IJ SOLN
30.0000 mg | Freq: Four times a day (QID) | INTRAMUSCULAR | Status: DC | PRN
  Administered 2015-10-01 – 2015-10-03 (×4): 30 mg via INTRAVENOUS
  Filled 2015-10-01 (×5): qty 1

## 2015-10-01 NOTE — Progress Notes (Signed)
Patient ID: Brian [name], male   DOB: September 27, 1955, 60 y.o.   MRN: [id number]   PROGRESS NOTE    Brian [name]  WUJ:[id number] DOB: [birth year] DOA: 09/30/2015  PCP: [name] Patient   Brief Narrative:  60 y.o. male with COPD, chronic alcohol use, Polysubstance abuse, homelessness. Patient seen for shortness of breath that started about a week ago and has been worsening, associated with cough productive of yellow sputum.   Assessment & Plan:   Principal Problem:   Acute respiratory failure with hypoxia and hypercarbia (HCC) - secondary to acute exacerbation of COPD and LLL PNA, CAP, unknown pathogen - pt reports feeling better but still with wheezing and rhonchi at bases - will continue Rocephin and Zithromax day #2 - provide BD's scheduled and as needed - continue current dose of solumedrol, no tapering yet until respiratory status improved - if pt not better in next 24 - 48 hours, consider CT chest for further evaluation   Active Problems:   Alcohol dependence (HCC) and now alcohol withdrawal - will place on CIWA and monitor clinical response - address cessation consultation once pt more medically stable     Homelessness - CM consulted     Accelerated HTN - from withdrawal - allow hydralazine as needed     Obesity  - Body mass index is 38.67 kg/(m^2).  DVT prophylaxis: Lovenox SQ Code Status: Full  Family Communication: Patient at bedside  Disposition Plan: Home in 1-2 days   Consultants:   None  Procedures:   None  Antimicrobials:   Zithromax 6/22 -->  Rocephin 6/22 -->   Subjective: Pt reports feeling better but still with dyspnea, very anxious appearing.   Objective: Filed Vitals:   10/01/15 0300 10/01/15 0317 10/01/15 0400 10/01/15 0500  BP:  118/76    Pulse:      Temp:  98 F (36.7 C)    TempSrc:  Axillary    Resp: Height:      Weight:      SpO2: 95% 93% 95% 97%    Intake/Output Summary (Last 24 hours) at 10/01/15  9562 Last data filed at 10/01/15 0400  Gross per 24 hour  Intake 763.33 ml  Output    700 ml  Net  63.33 ml   Filed Weights   09/30/15 1630 09/30/15 2000  Weight: 83.915 kg (185 lb) 108.636 kg (239 lb 8 oz)    Examination:  General exam: Appears anxious, tremors at rest noted  Respiratory system: Course breath sounds bilaterally with wheezing and rhonchi at bases  Cardiovascular system: S1 & S2 heard, RRR. No JVD, murmurs, rubs, gallops or clicks. No pedal edema. Gastrointestinal system: Abdomen is nondistended, soft and nontender.  Central nervous system: Alert and oriented. Tremors noted, no ataxia, moving all 4 extremities spontaneously and against gravity  Extremities: Symmetric 5 x 5 power.   Data Reviewed: I have personally reviewed following labs and imaging studies  CBC:  Recent Labs Lab 09/30/15 1635 10/01/15 0224  WBC 11.5* 7.7  NEUTROABS 8.3*  --   HGB 17.3* 16.4  HCT 52.3* 51.4  MCV 95.8 97.5  PLT 179 175   Basic Metabolic Panel:  Recent Labs Lab 09/30/15 1635 10/01/15 0224  NA 138 140  K 5.1 4.9  CL 102 103  CO2 26 32  GLUCOSE 108* 218*  BUN 10 16  CREATININE 0.77 0.82  CALCIUM 9.3 9.6   Liver Function Tests:  Recent Labs Lab 09/30/15 1635  AST 38  ALT 53  ALKPHOS 47  BILITOT 1.2  PROT 7.7  ALBUMIN 4.2    Recent Labs Lab 09/30/15 1635  LIPASE 19   CBG:  Recent Labs Lab 09/30/15 2054  GLUCAP 299*   Recent Results (from the past 240 hour(s))  MRSA PCR Screening     Status: None   Collection Time: 09/30/15  8:30 PM  Result Value Ref Range Status   MRSA by PCR NEGATIVE NEGATIVE Final    Comment:        The GeneXpert MRSA Assay (FDA approved for NASAL specimens only), is one component of a comprehensive MRSA colonization surveillance program. It is not intended to diagnose MRSA infection nor to guide or monitor treatment for MRSA infections.       Radiology Studies: Dg Chest Portable 1 View  09/30/2015  CLINICAL  DATA:  Short of breath for 1 week EXAM: PORTABLE CHEST 1 VIEW COMPARISON:  08/17/2015 FINDINGS: Normal heart size. Lungs are hyperaerated. Minimal patchy density at the left base. No pneumothorax. Normal vascularity. IMPRESSION: Mild patchy airspace disease at the left base. Followup PA and lateral chest X-ray is recommended in 3-4 weeks following trial of antibiotic therapy to ensure resolution and exclude underlying malignancy. Electronically Signed   By: Jolaine Click M.D.   On: 09/30/2015 16:57      Scheduled Meds: . antiseptic oral rinse  7 mL Mouth Rinse BID  . azithromycin  500 mg Intravenous Q24H  . cefTRIAXone (ROCEPHIN)  IV  1 g Intravenous Q24H  . enoxaparin (LOVENOX) injection  40 mg Subcutaneous Q24H  . folic acid  1 mg Intravenous Daily  . insulin aspart  0-20 Units Subcutaneous TID WC  . insulin aspart  0-5 Units Subcutaneous QHS  . ipratropium-albuterol  3 mL Nebulization Q6H  . methylPREDNISolone (SOLU-MEDROL) injection  60 mg Intravenous Q12H  . pneumococcal 23 valent vaccine  0.5 mL Intramuscular Tomorrow-1000  . thiamine  100 mg Intravenous Daily   Continuous Infusions:    LOS: 1 day    Time spent: 20 minutes    Brian Presto, MD [facility] Pager [phone]  If 7PM-7AM, please contact night-coverage [email or web address] Password [email or web address] 10/01/2015, 6:52 AM

## 2015-10-01 NOTE — Progress Notes (Signed)
RT made RN aware of ABG results. Pt is a CO2 retainer. Pt is stable at this time no distress or complications noted.

## 2015-10-01 NOTE — Progress Notes (Signed)
ABG collected on 5L [place]. RN stated that patient wanted to come off NIV.

## 2015-10-02 ENCOUNTER — Inpatient Hospital Stay (HOSPITAL_COMMUNITY): Payer: MEDICAID

## 2015-10-02 LAB — GLUCOSE, CAPILLARY
GLUCOSE-CAPILLARY: 203 mg/dL — AB (ref 65–99)
GLUCOSE-CAPILLARY: 218 mg/dL — AB (ref 65–99)
Glucose-Capillary: 121 mg/dL — ABNORMAL HIGH (ref 65–99)
Glucose-Capillary: 146 mg/dL — ABNORMAL HIGH (ref 65–99)
Glucose-Capillary: 167 mg/dL — ABNORMAL HIGH (ref 65–99)

## 2015-10-02 LAB — COMPREHENSIVE METABOLIC PANEL
ALBUMIN: 3.2 g/dL — AB (ref 3.5–5.0)
ALT: 33 U/L (ref 17–63)
ANION GAP: 5 (ref 5–15)
AST: 19 U/L (ref 15–41)
Alkaline Phosphatase: 38 U/L (ref 38–126)
BILIRUBIN TOTAL: 0.3 mg/dL (ref 0.3–1.2)
BUN: 21 mg/dL — ABNORMAL HIGH (ref 6–20)
CO2: 31 mmol/L (ref 22–32)
Calcium: 9.5 mg/dL (ref 8.9–10.3)
Chloride: 102 mmol/L (ref 101–111)
Creatinine, Ser: 0.85 mg/dL (ref 0.61–1.24)
Glucose, Bld: 173 mg/dL — ABNORMAL HIGH (ref 65–99)
POTASSIUM: 4.7 mmol/L (ref 3.5–5.1)
SODIUM: 138 mmol/L (ref 135–145)
TOTAL PROTEIN: 6.2 g/dL — AB (ref 6.5–8.1)

## 2015-10-02 LAB — CBC
HCT: 37.8 % — ABNORMAL LOW (ref 39.0–52.0)
HEMOGLOBIN: 11.8 g/dL — AB (ref 13.0–17.0)
MCH: 29 pg (ref 26.0–34.0)
MCHC: 31.2 g/dL (ref 30.0–36.0)
MCV: 92.9 fL (ref 78.0–100.0)
Platelets: 335 10*3/uL (ref 150–400)
RBC: 4.07 MIL/uL — ABNORMAL LOW (ref 4.22–5.81)
RDW: 12.3 % (ref 11.5–15.5)
WBC: 10.3 10*3/uL (ref 4.0–10.5)

## 2015-10-02 LAB — MAGNESIUM: MAGNESIUM: 2 mg/dL (ref 1.7–2.4)

## 2015-10-02 MED ORDER — LORAZEPAM 1 MG PO TABS
1.0000 mg | ORAL_TABLET | Freq: Four times a day (QID) | ORAL | Status: AC | PRN
  Administered 2015-10-05: 1 mg via ORAL
  Filled 2015-10-02 (×2): qty 1

## 2015-10-02 MED ORDER — THIAMINE HCL 100 MG/ML IJ SOLN: 100.0000 mg | Freq: Every day | INTRAMUSCULAR | Status: DC

## 2015-10-02 MED ORDER — VITAMIN B-1 100 MG PO TABS: 100.0000 mg | ORAL_TABLET | Freq: Every day | ORAL | Status: DC

## 2015-10-02 MED ORDER — LORAZEPAM 1 MG PO TABS
0.0000 mg | ORAL_TABLET | Freq: Two times a day (BID) | ORAL | Status: AC
  Administered 2015-10-04 – 2015-10-05 (×2): 1 mg via ORAL
  Filled 2015-10-02 (×2): qty 1

## 2015-10-02 MED ORDER — OXYCODONE-ACETAMINOPHEN 5-325 MG PO TABS
1.0000 | ORAL_TABLET | ORAL | Status: DC | PRN
  Administered 2015-10-02 (×3): 2 via ORAL
  Administered 2015-10-02: 1 via ORAL
  Administered 2015-10-03 – 2015-10-04 (×7): 2 via ORAL
  Administered 2015-10-04: 1 via ORAL
  Administered 2015-10-05 – 2015-10-07 (×8): 2 via ORAL
  Filled 2015-10-02 (×19): qty 2
  Filled 2015-10-02: qty 1

## 2015-10-02 MED ORDER — LORAZEPAM 1 MG PO TABS
0.0000 mg | ORAL_TABLET | Freq: Four times a day (QID) | ORAL | Status: AC
  Administered 2015-10-02: 2 mg via ORAL
  Administered 2015-10-03: 4 mg via ORAL
  Administered 2015-10-03: 2 mg via ORAL
  Administered 2015-10-03 – 2015-10-04 (×4): 4 mg via ORAL
  Filled 2015-10-02 (×2): qty 4
  Filled 2015-10-02: qty 2
  Filled 2015-10-02 (×3): qty 4
  Filled 2015-10-02: qty 2

## 2015-10-02 MED ORDER — FOLIC ACID 1 MG PO TABS
1.0000 mg | ORAL_TABLET | Freq: Every day | ORAL | Status: DC
  Administered 2015-10-02 – 2015-10-07 (×6): 1 mg via ORAL
  Filled 2015-10-02 (×6): qty 1

## 2015-10-02 MED ORDER — LORAZEPAM 2 MG/ML IJ SOLN
1.0000 mg | Freq: Four times a day (QID) | INTRAMUSCULAR | Status: AC | PRN
  Administered 2015-10-02 – 2015-10-03 (×2): 1 mg via INTRAVENOUS
  Filled 2015-10-02 (×2): qty 1

## 2015-10-02 MED ORDER — IPRATROPIUM-ALBUTEROL 0.5-2.5 (3) MG/3ML IN SOLN
3.0000 mL | Freq: Three times a day (TID) | RESPIRATORY_TRACT | Status: DC
  Administered 2015-10-03 – 2015-10-04 (×4): 3 mL via RESPIRATORY_TRACT
  Filled 2015-10-02 (×4): qty 3

## 2015-10-02 MED ORDER — ADULT MULTIVITAMIN W/MINERALS CH
1.0000 | ORAL_TABLET | Freq: Every day | ORAL | Status: DC
  Administered 2015-10-02 – 2015-10-07 (×6): 1 via ORAL
  Filled 2015-10-02 (×7): qty 1

## 2015-10-02 NOTE — Progress Notes (Signed)
Patient ID: Brian [name], male   DOB: 1955/12/30, 60 y.o.   MRN: [id number]   PROGRESS NOTE    Brian [name]  [id number]:[id number] DOB: [birth year] DOA: 09/30/2015  PCP: [name] Patient   Brief Narrative:  60 y.o. male with COPD, chronic alcohol use, Polysubstance abuse, homelessness. Patient seen for shortness of breath that started about a week ago and has been worsening, associated with cough productive of yellow sputum.   Assessment & Plan:   Principal Problem:   Acute respiratory failure with hypoxia and hypercarbia (HCC) - secondary to acute exacerbation of COPD and LLL PNA, CAP, unknown pathogen - pt reports feeling better but still with wheezing and rhonchi at bases on exam - will continue Rocephin and Zithromax day #3 - provide BD's scheduled and as needed - continue current dose of solumedrol, no tapering yet, possibly in AM - CT chest for clearer evaluation   Active Problems:   Alcohol dependence (HCC) and now alcohol withdrawal - keep on CIWA and monitor clinical response - address cessation consultation once pt more medically stable     Homelessness - CM consulted     Accelerated HTN - from withdrawal - allow hydralazine as needed     Obesity  - Body mass index is 38.67 kg/(m^2).  DVT prophylaxis: Lovenox SQ Code Status: Full  Family Communication: Patient at bedside  Disposition Plan: Home by 6/26  Consultants:   None  Procedures:   None  Antimicrobials:   Zithromax 6/22 -->  Rocephin 6/22 -->   Subjective: Pt reports feeling better but still with dyspnea, very anxious appearing.   Objective: Filed Vitals:   10/01/15 2359 10/02/15 0000 10/02/15 0256 10/02/15 0423  BP: 160/90 136/105  143/101  Pulse:      Temp: 98.7 F (37.1 C)   98.2 F (36.8 C)  TempSrc: Oral   Oral  Resp: Height:      Weight:      SpO2: 90% 87% 93% 93%    Intake/Output Summary (Last 24 hours) at 10/02/15 0718 Last data filed at 10/01/15 1810  Gross per 24 hour  Intake   1200 ml  Output    400 ml  Net    800 ml   Filed Weights   09/30/15 1630 09/30/15 2000  Weight: 83.915 kg (185 lb) 108.636 kg (239 lb 8 oz)    Examination:  General exam: Appears anxious, tremors at rest noted  Respiratory system: Course breath sounds bilaterally with wheezing and rhonchi at bases  Cardiovascular system: S1 & S2 heard, RRR. No JVD, murmurs, rubs, gallops or clicks. No pedal edema. Gastrointestinal system: Abdomen is nondistended, soft and nontender.  Central nervous system: Alert and oriented. Tremors noted, no ataxia, moving all 4 extremities spontaneously and against gravity  Extremities: Symmetric 5 x 5 power.   Data Reviewed: I have personally reviewed following labs and imaging studies  CBC:  Recent Labs Lab 09/30/15 1635 10/01/15 0224 10/02/15 0249  WBC 11.5* 7.7 10.3  NEUTROABS 8.3*  --   --   HGB 17.3* 16.4 11.8*  HCT 52.3* 51.4 37.8*  MCV 95.8 97.5 92.9  PLT 179 175 335   Basic Metabolic Panel:  Recent Labs Lab 09/30/15 1635 10/01/15 0224 10/02/15 0249  NA 138 140 138  K 5.1 4.9 4.7  CL 102 103 102  CO2 26 32 31  GLUCOSE 108* 218* 173*  BUN 10 16 21*  CREATININE 0.77 0.82 0.85  CALCIUM 9.3  9.6 9.5  MG  --   --  2.0   Liver Function Tests:  Recent Labs Lab 09/30/15 1635 10/02/15 0249  AST 38 19  ALT 53 33  ALKPHOS 47 38  BILITOT 1.2 0.3  PROT 7.7 6.2*  ALBUMIN 4.2 3.2*    Recent Labs Lab 09/30/15 1635  LIPASE 19   CBG:  Recent Labs Lab 09/30/15 2054 10/01/15 0818 10/01/15 1131 10/01/15 1628 10/01/15 2116  GLUCAP 299* 155* 123* 190* 207*   Recent Results (from the past 240 hour(s))  Blood culture (routine x 2)     Status: None (Preliminary result)   Collection Time: 09/30/15  5:27 PM  Result Value Ref Range Status   Specimen Description BLOOD RIGHT ANTECUBITAL  Final   Special Requests BOTTLES DRAWN AEROBIC AND ANAEROBIC 5CC  Final   Culture NO GROWTH < 24 HOURS  Final    Report Status PENDING  Incomplete  Blood culture (routine x 2)     Status: None (Preliminary result)   Collection Time: 09/30/15  5:32 PM  Result Value Ref Range Status   Specimen Description BLOOD RIGHT HAND  Final   Special Requests BOTTLES DRAWN AEROBIC ONLY 5CC  Final   Culture NO GROWTH < 24 HOURS  Final   Report Status PENDING  Incomplete  MRSA PCR Screening     Status: None   Collection Time: 09/30/15  8:30 PM  Result Value Ref Range Status   MRSA by PCR NEGATIVE NEGATIVE Final    Comment:        The GeneXpert MRSA Assay (FDA approved for NASAL specimens only), is one component of a comprehensive MRSA colonization surveillance program. It is not intended to diagnose MRSA infection nor to guide or monitor treatment for MRSA infections.       Radiology Studies: Dg Chest Portable 1 View  09/30/2015  CLINICAL DATA:  Short of breath for 1 week EXAM: PORTABLE CHEST 1 VIEW COMPARISON:  08/17/2015 FINDINGS: Normal heart size. Lungs are hyperaerated. Minimal patchy density at the left base. No pneumothorax. Normal vascularity. IMPRESSION: Mild patchy airspace disease at the left base. Followup PA and lateral chest X-ray is recommended in 3-4 weeks following trial of antibiotic therapy to ensure resolution and exclude underlying malignancy. Electronically Signed   By: Jolaine Click M.D.   On: 09/30/2015 16:57      Scheduled Meds: . antiseptic oral rinse  7 mL Mouth Rinse BID  . azithromycin  500 mg Intravenous Q24H  . cefTRIAXone (ROCEPHIN)  IV  1 g Intravenous Q24H  . enoxaparin (LOVENOX) injection  40 mg Subcutaneous Q24H  . folic acid  1 mg Intravenous Daily  . insulin aspart  0-20 Units Subcutaneous TID WC  . insulin aspart  0-5 Units Subcutaneous QHS  . ipratropium-albuterol  3 mL Nebulization Q6H  . methylPREDNISolone (SOLU-MEDROL) injection  60 mg Intravenous Q12H  . pneumococcal 23 valent vaccine  0.5 mL Intramuscular Tomorrow-1000  . thiamine  100 mg Intravenous Daily    Continuous Infusions:    LOS: 2 days    Time spent: 20 minutes    Debbora Presto, MD [facility] Pager [phone]  If 7PM-7AM, please contact night-coverage [email or web address] Password [facility] 10/02/2015, 7:18 AM

## 2015-10-03 DIAGNOSIS — J441 Chronic obstructive pulmonary disease with (acute) exacerbation: Secondary | ICD-10-CM

## 2015-10-03 DIAGNOSIS — J189 Pneumonia, unspecified organism: Secondary | ICD-10-CM

## 2015-10-03 LAB — GLUCOSE, CAPILLARY
GLUCOSE-CAPILLARY: 128 mg/dL — AB (ref 65–99)
GLUCOSE-CAPILLARY: 121 mg/dL — AB (ref 65–99)
GLUCOSE-CAPILLARY: 129 mg/dL — AB (ref 65–99)
Glucose-Capillary: 159 mg/dL — ABNORMAL HIGH (ref 65–99)

## 2015-10-03 LAB — BASIC METABOLIC PANEL
ANION GAP: 7 (ref 5–15)
BUN: 22 mg/dL — ABNORMAL HIGH (ref 6–20)
CALCIUM: 9.5 mg/dL (ref 8.9–10.3)
CHLORIDE: 102 mmol/L (ref 101–111)
CO2: 28 mmol/L (ref 22–32)
Creatinine, Ser: 0.83 mg/dL (ref 0.61–1.24)
GFR calc Af Amer: >60 mL/min (ref >60)
GFR calc non Af Amer: >60 mL/min (ref >60)
GLUCOSE: 157 mg/dL — AB (ref 65–99)
Potassium: 4.7 mmol/L (ref 3.5–5.1)
Sodium: 137 mmol/L (ref 135–145)

## 2015-10-03 LAB — CBC
HEMATOCRIT: 48.5 % (ref 39.0–52.0)
Hemoglobin: 16.2 g/dL (ref 13.0–17.0)
MCH: 30.9 pg (ref 26.0–34.0)
MCHC: 31.8 g/dL (ref 30.0–36.0)
MCV: 97.4 fL (ref 78.0–100.0)
Platelets: 223 10*3/uL (ref 150–400)
RBC: 4.98 MIL/uL (ref 4.22–5.81)
RDW: 13.6 % (ref 11.5–15.5)
WBC: 13.3 10*3/uL — AB (ref 4.0–10.5)

## 2015-10-03 MED ORDER — SODIUM CHLORIDE 0.9 % IV SOLN
INTRAVENOUS | Status: AC
  Administered 2015-10-03: 10:00:00 via INTRAVENOUS

## 2015-10-03 MED ORDER — LABETALOL HCL 100 MG PO TABS
100.0000 mg | ORAL_TABLET | Freq: Three times a day (TID) | ORAL | Status: DC
  Administered 2015-10-03 – 2015-10-07 (×13): 100 mg via ORAL
  Filled 2015-10-03 (×14): qty 1

## 2015-10-03 MED ORDER — HYDRALAZINE HCL 20 MG/ML IJ SOLN
5.0000 mg | INTRAMUSCULAR | Status: DC | PRN
  Administered 2015-10-04 – 2015-10-05 (×2): 5 mg via INTRAVENOUS
  Filled 2015-10-03 (×3): qty 1

## 2015-10-03 MED ORDER — HYDROMORPHONE HCL 1 MG/ML IJ SOLN
1.0000 mg | INTRAMUSCULAR | Status: DC | PRN
  Administered 2015-10-03 (×2): 1 mg via INTRAVENOUS
  Filled 2015-10-03 (×2): qty 1

## 2015-10-03 MED ORDER — PREDNISONE 50 MG PO TABS
50.0000 mg | ORAL_TABLET | Freq: Every day | ORAL | Status: DC
Start: 2015-10-03 — End: 2015-10-05
  Administered 2015-10-03 – 2015-10-05 (×3): 50 mg via ORAL
  Filled 2015-10-03 (×3): qty 1

## 2015-10-03 NOTE — Progress Notes (Signed)
Patient ID: Brian [name], male   DOB: 1955/06/08, 60 y.o.   MRN: [id number]   PROGRESS NOTE    Brian [name]  [id number] DOB: 1955-11-04 DOA: 09/30/2015  PCP: [name] Patient   Brief Narrative:  60 y.o. male with COPD, chronic alcohol use, Polysubstance abuse, homelessness. Patient seen for shortness of breath that started about a week ago and has been worsening, associated with cough productive of yellow sputum.   Assessment & Plan:   Principal Problem:   Acute respiratory failure with hypoxia and hypercarbia (HCC) - secondary to acute exacerbation of COPD and LLL PNA, CAP, unknown pathogen - pt reports less dyspnea but still with cough which he says has been chronic  - will continue Rocephin and Zithromax day #4/7 - provide BD's scheduled and as needed - taper off solumedrol and transition to oral Prednisone 50 mg tablet today, taper down by 10 mg daily until completed  - CT chest confirms left lobe PNA and emphysematous changes   Active Problems:   Alcohol dependence (HCC) and with subsequent alcohol withdrawal - still with significant shakes even while on CIWA   - it appears that overall looks better but still requiring step down unit protocol CIWA    Homelessness - CM consulted     Hypertensive urgency with tachycardia  - from alcohol withdrawal - started labetalol 100 mg PO TID, added hydralazine as needed - place on IVF for 24 hours as pt with slightly elevated BUN and still not drinking as much fluids PO - monitor closely     Obesity  - Body mass index is 38.67 kg/(m^2).  DVT prophylaxis: Lovenox SQ Code Status: Full  Family Communication: Patient at bedside, declined offer to update family  Disposition Plan: Home by 6/28 if no withdrawal   Consultants:   None  Procedures:   None  Antimicrobials:   Zithromax 6/22 -->  Rocephin 6/22 -->   Subjective: Pt reports still shaking especially when he is holding objects.   Objective: Filed  Vitals:   10/02/15 2336 10/03/15 0335 10/03/15 0400 10/03/15 0600  BP: 160/114 192/98    Pulse:      Temp: 97.6 F (36.4 C) 97.7 F (36.5 C)    TempSrc: Oral Oral    Resp: Height:      Weight:      SpO2: 92% 90% 98% 93%    Intake/Output Summary (Last 24 hours) at 10/03/15 0830 Last data filed at 10/03/15 0730  Gross per 24 hour  Intake   1250 ml  Output   1800 ml  Net   -550 ml   Filed Weights   09/30/15 1630 09/30/15 2000  Weight: 83.915 kg (185 lb) 108.636 kg (239 lb 8 oz)    Examination:  General exam: Appears more calm this AM, tremors at rest noted  Respiratory system: better air movement but still with rhonchi at bases, minimal expiratory wheezing  Cardiovascular system: S1 & S2 heard, RRR. No JVD, murmurs, rubs, gallops or clicks. No pedal edema. Gastrointestinal system: Abdomen is nondistended, soft and nontender.  Central nervous system: Alert and oriented. Tremors noted, no ataxia, moving all 4 extremities spontaneously and against gravity  Extremities: Symmetric 5 x 5 power.   Data Reviewed: I have personally reviewed following labs and imaging studies  CBC:  Recent Labs Lab 09/30/15 1635 10/01/15 0224 10/02/15 0249 10/03/15 0218  WBC 11.5* 7.7 10.3 13.3*  NEUTROABS 8.3*  --   --   --  HGB 17.3* 16.4 11.8* 16.2  HCT 52.3* 51.4 37.8* 48.5  MCV 95.8 97.5 92.9 97.4  PLT 179 175 335 223   Basic Metabolic Panel:  Recent Labs Lab 09/30/15 1635 10/01/15 0224 10/02/15 0249 10/03/15 0218  NA 138 140 138 137  K 5.1 4.9 4.7 4.7  CL 102 103 102 102  CO2 26 32 31 28  GLUCOSE 108* 218* 173* 157*  BUN 10 16 21* 22*  CREATININE 0.77 0.82 0.85 0.83  CALCIUM 9.3 9.6 9.5 9.5  MG  --   --  2.0  --    Liver Function Tests:  Recent Labs Lab 09/30/15 1635 10/02/15 0249  AST 38 19  ALT 53 33  ALKPHOS 47 38  BILITOT 1.2 0.3  PROT 7.7 6.2*  ALBUMIN 4.2 3.2*    Recent Labs Lab 09/30/15 1635  LIPASE 19   CBG:  Recent Labs Lab  10/02/15 0841 10/02/15 0929 10/02/15 1204 10/02/15 1715 10/02/15 2057  GLUCAP 218* 203* 121* 146* 167*   Recent Results (from the past 240 hour(s))  Blood culture (routine x 2)     Status: None (Preliminary result)   Collection Time: 09/30/15  5:27 PM  Result Value Ref Range Status   Specimen Description BLOOD RIGHT ANTECUBITAL  Final   Special Requests BOTTLES DRAWN AEROBIC AND ANAEROBIC 5CC  Final   Culture NO GROWTH 2 DAYS  Final   Report Status PENDING  Incomplete  Blood culture (routine x 2)     Status: None (Preliminary result)   Collection Time: 09/30/15  5:32 PM  Result Value Ref Range Status   Specimen Description BLOOD RIGHT HAND  Final   Special Requests BOTTLES DRAWN AEROBIC ONLY 5CC  Final   Culture NO GROWTH 2 DAYS  Final   Report Status PENDING  Incomplete  MRSA PCR Screening     Status: None   Collection Time: 09/30/15  8:30 PM  Result Value Ref Range Status   MRSA by PCR NEGATIVE NEGATIVE Final    Comment:        The GeneXpert MRSA Assay (FDA approved for NASAL specimens only), is one component of a comprehensive MRSA colonization surveillance program. It is not intended to diagnose MRSA infection nor to guide or monitor treatment for MRSA infections.       Radiology Studies: Ct Chest Wo Contrast  10/02/2015  CLINICAL DATA:  Shortness of Breath, cough, congestion, COPD EXAM: CT CHEST WITHOUT CONTRAST TECHNIQUE: Multidetector CT imaging of the chest was performed following the standard protocol without IV contrast. COMPARISON:  Chest x-ray 09/30/2015 FINDINGS: Cardiovascular: Atherosclerotic calcifications of thoracic aorta are noted. Atherosclerotic calcifications of coronary arteries. Heart size within normal limits. No pericardial effusion. Mediastinum: There is no mediastinal hematoma or adenopathy. A precarinal lymph node measures 8 mm short-axis not pathologic by size criteria. No hilar adenopathy. Lungs/Pleura: Images of the lung parenchyma shows mild  hyperinflation. Mild emphysematous changes are noted upper lobes. There is linear scarring or atelectasis in right upper lobe just anterior to major fissure. Mild atelectasis or scarring noted in right middle lobe and right base anterior medially. There is streaky small atelectasis or infiltrate in left base anterolaterally please see axial image 84 and 90 lung windows images. No segmental consolidation. No pulmonary edema. There is no bronchiectasis. Upper Abdomen: Visualized upper abdomen shows mild fatty infiltration of the liver. No adrenal gland mass is noted. Visualized unenhanced pancreas and spleen is unremarkable. Visualized upper kidneys are unremarkable. Atherosclerotic calcifications of abdominal aorta. Musculoskeletal:  No destructive bony lesions are noted. Sagittal images of the spine shows degenerative changes mid and lower thoracic spine. IMPRESSION: 1. No mediastinal hematoma or adenopathy. Atherosclerotic calcifications of thoracic aorta and coronary arteries. No pleural or pericardial effusion. 2. Mild hyperinflation. Mild emphysematous changes are noted bilateral upper lobes. There is linear atelectasis or scarring in right upper lobe. Mild atelectasis or scarring noted right middle lobe and right base anteromedially. Subtle mild streaky atelectasis or infiltrate in left base anterolaterally. No segmental consolidation. No pulmonary edema. 3. Degenerative changes thoracic spine. 4. No adrenal gland mass. 5. Mild fatty infiltration of the liver. Electronically Signed   By: Natasha Mead M.D.   On: 10/02/2015 11:18      Scheduled Meds: . antiseptic oral rinse  7 mL Mouth Rinse BID  . azithromycin  500 mg Intravenous Q24H  . cefTRIAXone (ROCEPHIN)  IV  1 g Intravenous Q24H  . enoxaparin (LOVENOX) injection  40 mg Subcutaneous Q24H  . folic acid  1 mg Oral Daily  . insulin aspart  0-20 Units Subcutaneous TID WC  . insulin aspart  0-5 Units Subcutaneous QHS  . ipratropium-albuterol  3 mL  Nebulization TID  . labetalol  100 mg Oral TID  . LORazepam  0-4 mg Oral Q6H   Followed by  . [START ON 10/04/2015] LORazepam  0-4 mg Oral Q12H  . multivitamin with minerals  1 tablet Oral Daily  . pneumococcal 23 valent vaccine  0.5 mL Intramuscular Tomorrow-1000  . predniSONE  50 mg Oral Q breakfast  . thiamine  100 mg Intravenous Daily   Continuous Infusions:    LOS: 3 days    Time spent: 20 minutes    Debbora Presto, MD [facility] Pager [phone]  If 7PM-7AM, please contact night-coverage [email or web address] Password [email or web address] 10/03/2015, 8:30 AM

## 2015-10-03 NOTE — Progress Notes (Signed)
RT placed patient on a HFNC due to sat at 88% on 6 L [place]. On 9 L sat 93%. RN notified

## 2015-10-04 LAB — GLUCOSE, CAPILLARY
GLUCOSE-CAPILLARY: 130 mg/dL — AB (ref 65–99)
Glucose-Capillary: 86 mg/dL (ref 65–99)
Glucose-Capillary: 111 mg/dL — ABNORMAL HIGH (ref 65–99)
Glucose-Capillary: 125 mg/dL — ABNORMAL HIGH (ref 65–99)

## 2015-10-04 LAB — COMPREHENSIVE METABOLIC PANEL
ALT: 46 U/L (ref 17–63)
AST: 29 U/L (ref 15–41)
Albumin: 3 g/dL — ABNORMAL LOW (ref 3.5–5.0)
Alkaline Phosphatase: 31 U/L — ABNORMAL LOW (ref 38–126)
Anion gap: 6 (ref 5–15)
BUN: 19 mg/dL (ref 6–20)
CHLORIDE: 100 mmol/L — AB (ref 101–111)
CO2: 33 mmol/L — AB (ref 22–32)
CREATININE: 0.84 mg/dL (ref 0.61–1.24)
Calcium: 9.1 mg/dL (ref 8.9–10.3)
GFR calc Af Amer: >60 mL/min (ref >60)
GFR calc non Af Amer: >60 mL/min (ref >60)
Glucose, Bld: 118 mg/dL — ABNORMAL HIGH (ref 65–99)
Potassium: 4.1 mmol/L (ref 3.5–5.1)
SODIUM: 139 mmol/L (ref 135–145)
Total Bilirubin: 0.5 mg/dL (ref 0.3–1.2)
Total Protein: 5.6 g/dL — ABNORMAL LOW (ref 6.5–8.1)

## 2015-10-04 LAB — CBC
HCT: 48.6 % (ref 39.0–52.0)
Hemoglobin: 15.6 g/dL (ref 13.0–17.0)
MCH: 32.1 pg (ref 26.0–34.0)
MCHC: 32.1 g/dL (ref 30.0–36.0)
MCV: 100 fL (ref 78.0–100.0)
PLATELETS: 212 10*3/uL (ref 150–400)
RBC: 4.86 MIL/uL (ref 4.22–5.81)
RDW: 13.6 % (ref 11.5–15.5)
WBC: 11 10*3/uL — ABNORMAL HIGH (ref 4.0–10.5)

## 2015-10-04 LAB — MAGNESIUM: Magnesium: 2 mg/dL (ref 1.7–2.4)

## 2015-10-04 MED ORDER — VITAMIN B-1 100 MG PO TABS
100.0000 mg | ORAL_TABLET | Freq: Every day | ORAL | Status: DC
  Administered 2015-10-05 – 2015-10-07 (×3): 100 mg via ORAL
  Filled 2015-10-04 (×3): qty 1

## 2015-10-04 MED ORDER — ALBUTEROL SULFATE (2.5 MG/3ML) 0.083% IN NEBU: 2.5000 mg | INHALATION_SOLUTION | RESPIRATORY_TRACT | Status: DC | PRN

## 2015-10-04 MED ORDER — IPRATROPIUM-ALBUTEROL 0.5-2.5 (3) MG/3ML IN SOLN
3.0000 mL | Freq: Two times a day (BID) | RESPIRATORY_TRACT | Status: DC
  Administered 2015-10-04 – 2015-10-05 (×2): 3 mL via RESPIRATORY_TRACT
  Filled 2015-10-04 (×2): qty 3

## 2015-10-04 MED ORDER — AZITHROMYCIN 500 MG PO TABS
500.0000 mg | ORAL_TABLET | Freq: Every day | ORAL | Status: DC
  Administered 2015-10-04 – 2015-10-07 (×4): 500 mg via ORAL
  Filled 2015-10-04 (×4): qty 1

## 2015-10-04 NOTE — Clinical Social Work Note (Signed)
CSW received consult for patient having homelessness issues.  CSW to see patient at a later time.  Ervin Knack. Mischele Detter, MSW, Theresia Majors [phone] 10/04/2015 6:10 PM

## 2015-10-04 NOTE — Progress Notes (Signed)
Patient ID: Brian [name], male   DOB: [birth year], 60 y.o.   MRN: [id number]   PROGRESS NOTE    Brian [name]  NWG:[id number] DOB: [birth year] DOA: 09/30/2015  PCP: [name] Patient   Brief Narrative:  60 y.o. male with COPD, chronic alcohol use, Polysubstance abuse, homelessness. Patient seen for shortness of breath that started about a week ago and has been worsening, associated with cough productive of yellow sputum.   Assessment & Plan:   Principal Problem:   Acute respiratory failure with hypoxia and hypercarbia (HCC) - secondary to acute exacerbation of COPD and LLL PNA, CAP, unknown pathogen - pt reports feeling better this AM - will continue Rocephin and Zithromax day #5/7 - provide BD's scheduled and as needed - continue with Prednisone tapering  - CT chest confirms left lobe PNA and emphysematous changes   Active Problems:   Alcohol dependence (HCC) and with subsequent alcohol withdrawal - still with significant shakes even while on CIWA   - it appears that overall looks better but still requiring step down unit protocol CIWA    Homelessness - CM consulted for assistance     Hypertensive urgency with tachycardia  - from alcohol withdrawal - started labetalol 100 mg PO TID, added hydralazine as needed - SBP better this AM, in 150's  - monitor closely     Obesity  - Body mass index is 38.67 kg/(m^2).  DVT prophylaxis: Lovenox SQ Code Status: Full  Family Communication: Patient at bedside, declined offer to update family  Disposition Plan: Home by 6/28 if no withdrawal   Consultants:   None  Procedures:   None  Antimicrobials:   Zithromax 6/22 -->  Rocephin 6/22 -->   Subjective: Pt reports still shaking especially when he is holding objects.   Objective: Filed Vitals:   10/04/15 1000 10/04/15 1100 10/04/15 1200 10/04/15 1249  BP: 144/97   156/122  Pulse: 67 63 63   Temp:    97.6 F (36.4 C)  TempSrc:    Oral  Resp: Height:      Weight:      SpO2: 97% 98% 99% 98%    Intake/Output Summary (Last 24 hours) at 10/04/15 1557 Last data filed at 10/04/15 0900  Gross per 24 hour  Intake    120 ml  Output   3125 ml  Net  -3005 ml   Filed Weights   09/30/15 1630 09/30/15 2000  Weight: 83.915 kg (185 lb) 108.636 kg (239 lb 8 oz)    Examination:  General exam: Appears more calm this AM, tremors at rest noted  Respiratory system: better air movement but still with rhonchi at bases, minimal expiratory wheezing  Cardiovascular system: S1 & S2 heard, RRR. No JVD, murmurs, rubs, gallops or clicks. No pedal edema. Gastrointestinal system: Abdomen is nondistended, soft and nontender.  Central nervous system: Alert and oriented. Tremors noted, no ataxia, moving all 4 extremities spontaneously and against gravity  Extremities: Symmetric 5 x 5 power.   Data Reviewed: I have personally reviewed following labs and imaging studies  CBC:  Recent Labs Lab 09/30/15 1635 10/01/15 0224 10/02/15 0249 10/03/15 0218 10/04/15 0245  WBC 11.5* 7.7 10.3 13.3* 11.0*  NEUTROABS 8.3*  --   --   --   --   HGB 17.3* 16.4 11.8* 16.2 15.6  HCT 52.3* 51.4 37.8* 48.5 48.6  MCV 95.8 97.5 92.9 97.4 100.0  PLT 179 175 335 223 212  Basic Metabolic Panel:  Recent Labs Lab 09/30/15 1635 10/01/15 0224 10/02/15 0249 10/03/15 0218 10/04/15 0245  NA 138 140 138 137 139  K 5.1 4.9 4.7 4.7 4.1  CL 102 103 102 102 100*  CO2 26 32 31 28 33*  GLUCOSE 108* 218* 173* 157* 118*  BUN 10 16 21* 22* 19  CREATININE 0.77 0.82 0.85 0.83 0.84  CALCIUM 9.3 9.6 9.5 9.5 9.1  MG  --   --  2.0  --  2.0   Liver Function Tests:  Recent Labs Lab 09/30/15 1635 10/02/15 0249 10/04/15 0245  AST 38 19 29  ALT 53 33 46  ALKPHOS 47 38 31*  BILITOT 1.2 0.3 0.5  PROT 7.7 6.2* 5.6*  ALBUMIN 4.2 3.2* 3.0*    Recent Labs Lab 09/30/15 1635  LIPASE 19   CBG:  Recent Labs Lab 10/03/15 1155 10/03/15 1657 10/03/15 2116  10/04/15 0744 10/04/15 1249  GLUCAP 128* 121* 129* 86 111*   Recent Results (from the past 240 hour(s))  Blood culture (routine x 2)     Status: None (Preliminary result)   Collection Time: 09/30/15  5:27 PM  Result Value Ref Range Status   Specimen Description BLOOD RIGHT ANTECUBITAL  Final   Special Requests BOTTLES DRAWN AEROBIC AND ANAEROBIC 5CC  Final   Culture NO GROWTH 4 DAYS  Final   Report Status PENDING  Incomplete  Blood culture (routine x 2)     Status: None (Preliminary result)   Collection Time: 09/30/15  5:32 PM  Result Value Ref Range Status   Specimen Description BLOOD RIGHT HAND  Final   Special Requests BOTTLES DRAWN AEROBIC ONLY 5CC  Final   Culture NO GROWTH 4 DAYS  Final   Report Status PENDING  Incomplete  MRSA PCR Screening     Status: None   Collection Time: 09/30/15  8:30 PM  Result Value Ref Range Status   MRSA by PCR NEGATIVE NEGATIVE Final    Comment:        The GeneXpert MRSA Assay (FDA approved for NASAL specimens only), is one component of a comprehensive MRSA colonization surveillance program. It is not intended to diagnose MRSA infection nor to guide or monitor treatment for MRSA infections.       Radiology Studies: No results found.    Scheduled Meds: . antiseptic oral rinse  7 mL Mouth Rinse BID  . azithromycin  500 mg Oral Daily  . cefTRIAXone (ROCEPHIN)  IV  1 g Intravenous Q24H  . enoxaparin (LOVENOX) injection  40 mg Subcutaneous Q24H  . folic acid  1 mg Oral Daily  . insulin aspart  0-20 Units Subcutaneous TID WC  . insulin aspart  0-5 Units Subcutaneous QHS  . ipratropium-albuterol  3 mL Nebulization BID  . labetalol  100 mg Oral TID  . LORazepam  0-4 mg Oral Q6H   Followed by  . LORazepam  0-4 mg Oral Q12H  . multivitamin with minerals  1 tablet Oral Daily  . pneumococcal 23 valent vaccine  0.5 mL Intramuscular Tomorrow-1000  . predniSONE  50 mg Oral Q breakfast  . [START ON 10/05/2015] thiamine  100 mg Oral Daily    Continuous Infusions:    LOS: 4 days    Time spent: 20 minutes    Debbora Presto, MD [facility] Pager [phone]  If 7PM-7AM, please contact night-coverage [email or web address] Password [facility] 10/04/2015, 3:57 PM

## 2015-10-04 NOTE — Progress Notes (Signed)
PHARMACIST - PHYSICIAN COMMUNICATION  CONCERNING: Antibiotic IV to Oral Route Change Policy  RECOMMENDATION: This patient is receiving Azithromycin by the intravenous route.  Based on criteria approved by the Pharmacy and Therapeutics Committee, the antibiotic(s) is/are being converted to the equivalent oral dose form(s).   DESCRIPTION: These criteria include:  Patient being treated for a respiratory tract infection, urinary tract infection, cellulitis or clostridium difficile associated diarrhea if on metronidazole  The patient is not neutropenic and does not exhibit a GI malabsorption state  The patient is eating (either orally or via tube) and/or has been taking other orally administered medications for a least 24 hours  The patient is improving clinically and has a Tmax < 100.5  If you have questions about this conversion, please contact the Pharmacy Department  []   [phone] )  [facility] []   [phone] )  [facility] [x]   [phone] )  [facility] []   [phone] )  [facility] []   [phone] )  [facility]   Harland German, [place] D 10/04/2015 10:10 AM

## 2015-10-05 LAB — CULTURE, BLOOD (ROUTINE X 2)
Culture: NO GROWTH
Culture: NO GROWTH

## 2015-10-05 LAB — GLUCOSE, CAPILLARY
GLUCOSE-CAPILLARY: 119 mg/dL — AB (ref 65–99)
GLUCOSE-CAPILLARY: 106 mg/dL — AB (ref 65–99)
Glucose-Capillary: 91 mg/dL (ref 65–99)
Glucose-Capillary: 153 mg/dL — ABNORMAL HIGH (ref 65–99)

## 2015-10-05 LAB — CBC
HEMATOCRIT: 50 % (ref 39.0–52.0)
Hemoglobin: 16 g/dL (ref 13.0–17.0)
MCH: 30.8 pg (ref 26.0–34.0)
MCHC: 32 g/dL (ref 30.0–36.0)
MCV: 96.3 fL (ref 78.0–100.0)
Platelets: 207 10*3/uL (ref 150–400)
RBC: 5.19 MIL/uL (ref 4.22–5.81)
RDW: 13.5 % (ref 11.5–15.5)
WBC: 9.5 10*3/uL (ref 4.0–10.5)

## 2015-10-05 LAB — BASIC METABOLIC PANEL
Anion gap: 5 (ref 5–15)
BUN: 20 mg/dL (ref 6–20)
CALCIUM: 9.3 mg/dL (ref 8.9–10.3)
CO2: 35 mmol/L — AB (ref 22–32)
CREATININE: 0.82 mg/dL (ref 0.61–1.24)
Chloride: 99 mmol/L — ABNORMAL LOW (ref 101–111)
GFR calc Af Amer: >60 mL/min (ref >60)
GFR calc non Af Amer: >60 mL/min (ref >60)
GLUCOSE: 91 mg/dL (ref 65–99)
Potassium: 4.1 mmol/L (ref 3.5–5.1)
Sodium: 139 mmol/L (ref 135–145)

## 2015-10-05 MED ORDER — IPRATROPIUM-ALBUTEROL 0.5-2.5 (3) MG/3ML IN SOLN
3.0000 mL | Freq: Four times a day (QID) | RESPIRATORY_TRACT | Status: DC
Start: 2015-10-05 — End: 2015-10-05

## 2015-10-05 MED ORDER — HYDRALAZINE HCL 25 MG PO TABS
25.0000 mg | ORAL_TABLET | Freq: Three times a day (TID) | ORAL | Status: DC
  Administered 2015-10-05 – 2015-10-07 (×7): 25 mg via ORAL
  Filled 2015-10-05 (×7): qty 1

## 2015-10-05 MED ORDER — PREDNISONE 20 MG PO TABS
40.0000 mg | ORAL_TABLET | Freq: Every day | ORAL | Status: DC
  Administered 2015-10-06: 40 mg via ORAL
  Filled 2015-10-05: qty 2

## 2015-10-05 MED ORDER — IPRATROPIUM-ALBUTEROL 0.5-2.5 (3) MG/3ML IN SOLN
3.0000 mL | Freq: Two times a day (BID) | RESPIRATORY_TRACT | Status: DC
  Administered 2015-10-05 – 2015-10-07 (×3): 3 mL via RESPIRATORY_TRACT
  Filled 2015-10-05 (×4): qty 3

## 2015-10-05 NOTE — Progress Notes (Signed)
1500 Transfwerred in from 2 H fully awake, alert and oriented follow commands calm and cooperative. Safety precaution observed

## 2015-10-05 NOTE — Progress Notes (Signed)
Patient ID: Brian [name], male   DOB: 1955-08-31, 60 y.o.   MRN: [id number]   PROGRESS NOTE    SAMUELL KNOBLE  HYQ:[id number] DOB: 31-Aug-1955 DOA: 09/30/2015  PCP: [name] Patient   Brief Narrative:  60 y.o. male with COPD, chronic alcohol use, Polysubstance abuse, homelessness. Patient seen for shortness of breath that started about a week ago and has been worsening, associated with cough productive of yellow sputum.   Assessment & Plan:   Principal Problem:   Acute respiratory failure with hypoxia and hypercarbia (HCC) - secondary to acute exacerbation of COPD and LLL PNA, CAP, unknown pathogen - pt reports feeling better this AM - will continue Rocephin and Zithromax day #6/7 - provide BD's scheduled and as needed - continue with Prednisone tapering  - CT chest confirms left lobe PNA and emphysematous changes  - pt overall better, still with productive cough on exam but no respiratory distress   Active Problems:   Alcohol dependence (HCC) and with subsequent alcohol withdrawal - still with significant shakes but overall better  - OK to move out of SDU     Homelessness - CM consulted for assistance     Hypertensive urgency with tachycardia  - from alcohol withdrawal - started labetalol 100 mg PO TID, added hydralazine PO TID - also continue hydralazine as needed  - SBP better this AM, in 160's  - monitor closely and OK to transfer to tele     Obesity  - Body mass index is 38.67 kg/(m^2).  DVT prophylaxis: Lovenox SQ Code Status: Full  Family Communication: Patient at bedside, declined offer to update family  Disposition Plan: transfer to tele   Consultants:   None  Procedures:   None  Antimicrobials:   Zithromax 6/22 -->  Rocephin 6/22 -->  Subjective: Pt reports still shaking especially when he is holding objects.   Objective: Filed Vitals:   10/05/15 0401 10/05/15 0738 10/05/15 0851 10/05/15 1155  BP: 160/93 180/106  160/93  Pulse:        Temp: 97.8 F (36.6 C) 97.6 F (36.4 C)  98 F (36.7 C)  TempSrc: Oral Oral  Oral  Resp: Height:      Weight:      SpO2: 98% 99% 99% 100%    Intake/Output Summary (Last 24 hours) at 10/05/15 1227 Last data filed at 10/05/15 0800  Gross per 24 hour  Intake    240 ml  Output   2500 ml  Net  -2260 ml   Filed Weights   09/30/15 1630 09/30/15 2000  Weight: 83.915 kg (185 lb) 108.636 kg (239 lb 8 oz)    Examination:  General exam: Appears more calm this AM, tremors at rest noted  Respiratory system: better air movement but still with rhonchi at bases, minimal expiratory wheezing  Cardiovascular system: S1 & S2 heard, RRR. No JVD, murmurs, rubs, gallops or clicks. No pedal edema. Gastrointestinal system: Abdomen is nondistended, soft and nontender.  Central nervous system: Alert and oriented. Tremors noted, no ataxia, moving all 4 extremities spontaneously and against gravity  Extremities: Symmetric 5 x 5 power.  Data Reviewed: I have personally reviewed following labs and imaging studies  CBC:  Recent Labs Lab 09/30/15 1635 10/01/15 0224 10/02/15 0249 10/03/15 0218 10/04/15 0245 10/05/15 0349  WBC 11.5* 7.7 10.3 13.3* 11.0* 9.5  NEUTROABS 8.3*  --   --   --   --   --   HGB 17.3*  16.4 11.8* 16.2 15.6 16.0  HCT 52.3* 51.4 37.8* 48.5 48.6 50.0  MCV 95.8 97.5 92.9 97.4 100.0 96.3  PLT 179 175 335 223 212 207   Basic Metabolic Panel:  Recent Labs Lab 10/01/15 0224 10/02/15 0249 10/03/15 0218 10/04/15 0245 10/05/15 0349  NA 140 138 137 139 139  K 4.9 4.7 4.7 4.1 4.1  CL 103 102 102 100* 99*  CO2 32 31 28 33* 35*  GLUCOSE 218* 173* 157* 118* 91  BUN 16 21* 22* 19 20  CREATININE 0.82 0.85 0.83 0.84 0.82  CALCIUM 9.6 9.5 9.5 9.1 9.3  MG  --  2.0  --  2.0  --    Liver Function Tests:  Recent Labs Lab 09/30/15 1635 10/02/15 0249 10/04/15 0245  AST 38 19 29  ALT 53 33 46  ALKPHOS 47 38 31*  BILITOT 1.2 0.3 0.5  PROT 7.7 6.2* 5.6*  ALBUMIN  4.2 3.2* 3.0*    Recent Labs Lab 09/30/15 1635  LIPASE 19   CBG:  Recent Labs Lab 10/04/15 1249 10/04/15 1700 10/04/15 2120 10/05/15 0736 10/05/15 1157  GLUCAP 111* 130* 125* 91 119*   Recent Results (from the past 240 hour(s))  Blood culture (routine x 2)     Status: None (Preliminary result)   Collection Time: 09/30/15  5:27 PM  Result Value Ref Range Status   Specimen Description BLOOD RIGHT ANTECUBITAL  Final   Special Requests BOTTLES DRAWN AEROBIC AND ANAEROBIC 5CC  Final   Culture NO GROWTH 4 DAYS  Final   Report Status PENDING  Incomplete  Blood culture (routine x 2)     Status: None (Preliminary result)   Collection Time: 09/30/15  5:32 PM  Result Value Ref Range Status   Specimen Description BLOOD RIGHT HAND  Final   Special Requests BOTTLES DRAWN AEROBIC ONLY 5CC  Final   Culture NO GROWTH 4 DAYS  Final   Report Status PENDING  Incomplete  MRSA PCR Screening     Status: None   Collection Time: 09/30/15  8:30 PM  Result Value Ref Range Status   MRSA by PCR NEGATIVE NEGATIVE Final    Radiology Studies: No results found.  Scheduled Meds: . antiseptic oral rinse  7 mL Mouth Rinse BID  . azithromycin  500 mg Oral Daily  . cefTRIAXone (ROCEPHIN)  IV  1 g Intravenous Q24H  . enoxaparin (LOVENOX) injection  40 mg Subcutaneous Q24H  . folic acid  1 mg Oral Daily  . hydrALAZINE  25 mg Oral Q8H  . insulin aspart  0-20 Units Subcutaneous TID WC  . insulin aspart  0-5 Units Subcutaneous QHS  . ipratropium-albuterol  3 mL Nebulization BID  . labetalol  100 mg Oral TID  . LORazepam  0-4 mg Oral Q12H  . multivitamin with minerals  1 tablet Oral Daily  . pneumococcal 23 valent vaccine  0.5 mL Intramuscular Tomorrow-1000  . predniSONE  50 mg Oral Q breakfast  . thiamine  100 mg Oral Daily   Continuous Infusions:    LOS: 5 days    Time spent: 20 minutes    Debbora Presto, MD [facility] Pager [phone]  If 7PM-7AM, please contact  night-coverage [email or web address] Password [facility] 10/05/2015, 12:27 PM

## 2015-10-06 LAB — GLUCOSE, CAPILLARY
Glucose-Capillary: 128 mg/dL — ABNORMAL HIGH (ref 65–99)
Glucose-Capillary: 110 mg/dL — ABNORMAL HIGH (ref 65–99)
Glucose-Capillary: 162 mg/dL — ABNORMAL HIGH (ref 65–99)
Glucose-Capillary: 136 mg/dL — ABNORMAL HIGH (ref 65–99)

## 2015-10-06 LAB — CBC
HCT: 53.9 % — ABNORMAL HIGH (ref 39.0–52.0)
Hemoglobin: 17.1 g/dL — ABNORMAL HIGH (ref 13.0–17.0)
MCH: 30.6 pg (ref 26.0–34.0)
MCHC: 31.7 g/dL (ref 30.0–36.0)
MCV: 96.4 fL (ref 78.0–100.0)
PLATELETS: 228 10*3/uL (ref 150–400)
RBC: 5.59 MIL/uL (ref 4.22–5.81)
RDW: 13.6 % (ref 11.5–15.5)
WBC: 9.6 10*3/uL (ref 4.0–10.5)

## 2015-10-06 LAB — BASIC METABOLIC PANEL
Anion gap: 10 (ref 5–15)
BUN: 25 mg/dL — AB (ref 6–20)
CHLORIDE: 100 mmol/L — AB (ref 101–111)
CO2: 31 mmol/L (ref 22–32)
CREATININE: 1 mg/dL (ref 0.61–1.24)
Calcium: 9.2 mg/dL (ref 8.9–10.3)
GFR calc Af Amer: >60 mL/min (ref >60)
GFR calc non Af Amer: >60 mL/min (ref >60)
GLUCOSE: 86 mg/dL (ref 65–99)
POTASSIUM: 4.3 mmol/L (ref 3.5–5.1)
Sodium: 141 mmol/L (ref 135–145)

## 2015-10-06 MED ORDER — PREDNISONE 20 MG PO TABS
30.0000 mg | ORAL_TABLET | Freq: Every day | ORAL | Status: DC
  Administered 2015-10-07: 30 mg via ORAL
  Filled 2015-10-06: qty 1

## 2015-10-06 MED ORDER — LORAZEPAM 1 MG PO TABS
0.0000 mg | ORAL_TABLET | Freq: Two times a day (BID) | ORAL | Status: DC
  Administered 2015-10-06: 1 mg via ORAL
  Filled 2015-10-06: qty 4

## 2015-10-06 NOTE — Care Management Note (Signed)
Case Management Note  Patient Details  Name: Brian [name] MRN: [id number] Date of Birth: 11-04-1955  Subjective/Objective:        Admitted with Acute Resp Failure            Action/Plan: Patient is homeless, [place] ( [place] [address] Patient stated that his parents died and he has a brother Brian [name] [phone]) that helps him at times. All of his mail goes to his brothers home. He does not want to go to a shelter and plans to return to living under the bridge at discharge;, eats his meals at the Shelter and goes to the [facility] or a friends home to bathe. He applied for disability 6 months ago, gets food stamps ($198 per month) and has applied for disability. NO PCP, No insurance- CM gave patient information on the [employer] and [employer]; He stated that he has decreased his drinking and does not use drugs anymore. Very, very difficult situation; CM will continue to follow for DCP  Expected Discharge Date:    possibly 10/10/2015              Expected Discharge Plan:  Homeless Shelter  In-House Referral:  Clinical Social Work  Discharge planning Services  CM Consult    Status of Service:  In process, will continue to follow  Reola Mosher [phone] 10/06/2015, 11:26 AM

## 2015-10-06 NOTE — Progress Notes (Signed)
Patient ID: Brian [name], male   DOB: 10/17/1955, 60 y.o.   MRN: [id number]   PROGRESS NOTE    Brian [name]  WUJ:[id number] DOB: March 04, 1956 DOA: 09/30/2015  PCP: [name] Patient   Brief Narrative:  60 y.o. male with COPD, chronic alcohol use, Polysubstance abuse, homelessness. Patient seen for shortness of breath that started about a week ago and has been worsening, associated with cough productive of yellow sputum.   Assessment & Plan:   Principal Problem:   Acute respiratory failure with hypoxia and hypercarbia (HCC) - secondary to acute exacerbation of COPD and LLL PNA, CAP, unknown pathogen - CT chest confirmed left lobe PNA and emphysematous changes  - pt reports feeling better this AM, still with mild exp wheezing  - will continue Rocephin and Zithromax day #7/7 - provide BD's scheduled and as needed - continue with Prednisone tapering, started with 50 mg tablet daily and tapering down by 10 mg daily until completed  - attempt to taper off oxygen  - possible discharge in few days depending on his alcohol withdrawal status   Active Problems:   Alcohol dependence (HCC) and with subsequent alcohol withdrawal - overall better but still with shakes - we are still keeping on CIWA protocol - this is really the only thing that is keeping pt here in the hospital - suspect when this is resolved, pt can go home     Homelessness - CM consulted for assistance  - unfortunate and difficult situation for this pleasant gentleman     Hypertensive urgency with tachycardia  - from alcohol withdrawal - started labetalol 100 mg PO TID, added hydralazine PO TID - also continue hydralazine as needed  - SBP better this AM, in 140's  - monitor closely and OK to transfer to tele     Obesity  - Body mass index is 38.67 kg/(m^2).  DVT prophylaxis: Lovenox SQ Code Status: Full  Family Communication: Patient at bedside, declined offer to update family  Disposition Plan: home when  alcohol withdrawal resolved   Consultants:   None  Procedures:   None  Antimicrobials:   Zithromax 6/22 --> 6/28  Rocephin 6/22 --> 6/28  Subjective: Pt reports still shaking, overall better.   Objective: Filed Vitals:   10/05/15 2010 10/05/15 2104 10/06/15 0446 10/06/15 1156  BP:  123/80 150/91 144/96  Pulse:  85 100 66  Temp:  97.8 F (36.6 C) 97.8 F (36.6 C) 97.6 F (36.4 C)  TempSrc:  Oral Oral Oral  Resp:  Height:      Weight:   88.179 kg (194 lb 6.4 oz)   SpO2: 98% 94% 93% 97%    Intake/Output Summary (Last 24 hours) at 10/06/15 1552 Last data filed at 10/06/15 1329  Gross per 24 hour  Intake   1230 ml  Output    700 ml  Net    530 ml   Filed Weights   09/30/15 2000 10/05/15 1456 10/06/15 0446  Weight: 108.636 kg (239 lb 8 oz) 88.4 kg (194 lb 14.2 oz) 88.179 kg (194 lb 6.4 oz)    Examination:  General exam: Appears more calm this AM, tremors at rest noted mostly with holding objects  Respiratory system: better air movement, minimal expiratory wheezing  Cardiovascular system: S1 & S2 heard, RRR. No JVD, murmurs, rubs, gallops or clicks. No pedal edema. Gastrointestinal system: Abdomen is nondistended, soft and nontender.  Central nervous system: Alert and oriented. Tremors noted, no ataxia,  moving all 4 extremities spontaneously and against gravity  Extremities: Symmetric 5 x 5 power.  Data Reviewed: I have personally reviewed following labs and imaging studies  CBC:  Recent Labs Lab 09/30/15 1635  10/02/15 0249 10/03/15 0218 10/04/15 0245 10/05/15 0349 10/06/15 0400  WBC 11.5*  < > 10.3 13.3* 11.0* 9.5 9.6  NEUTROABS 8.3*  --   --   --   --   --   --   HGB 17.3*  < > 11.8* 16.2 15.6 16.0 17.1*  HCT 52.3*  < > 37.8* 48.5 48.6 50.0 53.9*  MCV 95.8  < > 92.9 97.4 100.0 96.3 96.4  PLT 179  < > 335 223 212 207 228  < > = values in this interval not displayed. Basic Metabolic Panel:  Recent Labs Lab 10/02/15 0249 10/03/15 0218  10/04/15 0245 10/05/15 0349 10/06/15 0400  NA 138 137 139 139 141  K 4.7 4.7 4.1 4.1 4.3  CL 102 102 100* 99* 100*  CO2 31 28 33* 35* 31  GLUCOSE 173* 157* 118* 91 86  BUN 21* 22* 19 20 25*  CREATININE 0.85 0.83 0.84 0.82 1.00  CALCIUM 9.5 9.5 9.1 9.3 9.2  MG 2.0  --  2.0  --   --    Liver Function Tests:  Recent Labs Lab 09/30/15 1635 10/02/15 0249 10/04/15 0245  AST 38 19 29  ALT 53 33 46  ALKPHOS 47 38 31*  BILITOT 1.2 0.3 0.5  PROT 7.7 6.2* 5.6*  ALBUMIN 4.2 3.2* 3.0*    Recent Labs Lab 09/30/15 1635  LIPASE 19   CBG:  Recent Labs Lab 10/05/15 1157 10/05/15 1644 10/05/15 2131 10/06/15 0738 10/06/15 1131  GLUCAP 119* 153* 106* 128* 110*   Recent Results (from the past 240 hour(s))  Blood culture (routine x 2)     Status: None (Preliminary result)   Collection Time: 09/30/15  5:27 PM  Result Value Ref Range Status   Specimen Description BLOOD RIGHT ANTECUBITAL  Final   Special Requests BOTTLES DRAWN AEROBIC AND ANAEROBIC 5CC  Final   Culture NO GROWTH 4 DAYS  Final   Report Status PENDING  Incomplete  Blood culture (routine x 2)     Status: None (Preliminary result)   Collection Time: 09/30/15  5:32 PM  Result Value Ref Range Status   Specimen Description BLOOD RIGHT HAND  Final   Special Requests BOTTLES DRAWN AEROBIC ONLY 5CC  Final   Culture NO GROWTH 4 DAYS  Final   Report Status PENDING  Incomplete  MRSA PCR Screening     Status: None   Collection Time: 09/30/15  8:30 PM  Result Value Ref Range Status   MRSA by PCR NEGATIVE NEGATIVE Final    Radiology Studies: No results found.  Scheduled Meds: . antiseptic oral rinse  7 mL Mouth Rinse BID  . azithromycin  500 mg Oral Daily  . cefTRIAXone (ROCEPHIN)  IV  1 g Intravenous Q24H  . enoxaparin (LOVENOX) injection  40 mg Subcutaneous Q24H  . folic acid  1 mg Oral Daily  . hydrALAZINE  25 mg Oral Q8H  . insulin aspart  0-20 Units Subcutaneous TID WC  . insulin aspart  0-5 Units Subcutaneous  QHS  . ipratropium-albuterol  3 mL Nebulization BID  . labetalol  100 mg Oral TID  . LORazepam  0-4 mg Oral Q12H  . multivitamin with minerals  1 tablet Oral Daily  . pneumococcal 23 valent vaccine  0.5 mL Intramuscular  Tomorrow-1000  . predniSONE  40 mg Oral Q breakfast  . thiamine  100 mg Oral Daily   Continuous Infusions:    LOS: 6 days    Time spent: 20 minutes    Debbora Presto, MD [facility] Pager [phone]  If 7PM-7AM, please contact night-coverage [email or web address] Password [facility] 10/06/2015, 3:52 PM

## 2015-10-06 NOTE — Progress Notes (Signed)
1800 with occasional visible tremors of finger of both hands when touched . No other neuro S and s seen

## 2015-10-07 DIAGNOSIS — J9601 Acute respiratory failure with hypoxia: Secondary | ICD-10-CM

## 2015-10-07 DIAGNOSIS — J9602 Acute respiratory failure with hypercapnia: Secondary | ICD-10-CM

## 2015-10-07 LAB — CBC
HEMATOCRIT: 51.6 % (ref 39.0–52.0)
HEMOGLOBIN: 16.6 g/dL (ref 13.0–17.0)
MCH: 31.7 pg (ref 26.0–34.0)
MCHC: 32.2 g/dL (ref 30.0–36.0)
MCV: 98.5 fL (ref 78.0–100.0)
Platelets: 250 10*3/uL (ref 150–400)
RBC: 5.24 MIL/uL (ref 4.22–5.81)
RDW: 13.5 % (ref 11.5–15.5)
WBC: 9.6 10*3/uL (ref 4.0–10.5)

## 2015-10-07 LAB — BASIC METABOLIC PANEL
Anion gap: 5 (ref 5–15)
BUN: 19 mg/dL (ref 6–20)
CHLORIDE: 103 mmol/L (ref 101–111)
CO2: 35 mmol/L — AB (ref 22–32)
CREATININE: 0.9 mg/dL (ref 0.61–1.24)
Calcium: 9.6 mg/dL (ref 8.9–10.3)
GFR calc Af Amer: >60 mL/min (ref >60)
GFR calc non Af Amer: >60 mL/min (ref >60)
GLUCOSE: 85 mg/dL (ref 65–99)
Potassium: 4.8 mmol/L (ref 3.5–5.1)
Sodium: 143 mmol/L (ref 135–145)

## 2015-10-07 LAB — GLUCOSE, CAPILLARY
Glucose-Capillary: 82 mg/dL (ref 65–99)
Glucose-Capillary: 105 mg/dL — ABNORMAL HIGH (ref 65–99)

## 2015-10-07 MED ORDER — PREDNISONE 10 MG PO TABS: 30.0000 mg | ORAL_TABLET | Freq: Every day | ORAL | Status: DC

## 2015-10-07 MED ORDER — THIAMINE HCL 100 MG PO TABS: 100.0000 mg | ORAL_TABLET | Freq: Every day | ORAL | Status: DC

## 2015-10-07 MED ORDER — IPRATROPIUM-ALBUTEROL 0.5-2.5 (3) MG/3ML IN SOLN: 3.0000 mL | Freq: Two times a day (BID) | RESPIRATORY_TRACT | Status: DC

## 2015-10-07 MED ORDER — HYDRALAZINE HCL 25 MG PO TABS: 25.0000 mg | ORAL_TABLET | Freq: Three times a day (TID) | ORAL | Status: DC

## 2015-10-07 MED ORDER — OXYCODONE-ACETAMINOPHEN 5-325 MG PO TABS: 1.0000 | ORAL_TABLET | Freq: Once | ORAL | Status: DC

## 2015-10-07 MED ORDER — ADULT MULTIVITAMIN W/MINERALS CH: 1.0000 | ORAL_TABLET | Freq: Every day | ORAL | Status: DC

## 2015-10-07 MED ORDER — LABETALOL HCL 100 MG PO TABS: 100.0000 mg | ORAL_TABLET | Freq: Three times a day (TID) | ORAL | Status: DC

## 2015-10-07 MED ORDER — FOLIC ACID 1 MG PO TABS: 1.0000 mg | ORAL_TABLET | Freq: Every day | ORAL | Status: DC

## 2015-10-07 MED FILL — predniSONE 10 MG TABS: 10 | 5 days supply | Qty: 15 | Fill #0

## 2015-10-07 MED FILL — FOLIC ACID 1 MG TABLET: 1 | 10 days supply | Qty: 10 | Fill #0

## 2015-10-07 MED FILL — hydrALAZINE HCL 25 MG TABS: 25 | 10 days supply | Qty: 30 | Fill #0

## 2015-10-07 MED FILL — LABETALOL HCL 100 MG TABLET: 100 | 10 days supply | Qty: 30 | Fill #0

## 2015-10-07 MED FILL — IPRAT-ALBUT 0.5-3(2.5) MG/3: 0.5-2.5 (3) | 60 days supply | Qty: 360 | Fill #0

## 2015-10-07 NOTE — Hospital Discharge Follow-Up (Signed)
This Case Manager was asked by Olga Coaster, RN CM to discuss resources available at [facility] and [facility].  Met with patient at bedside. Patient confirmed he does not have a PCP and is uninsured. Provided information about the [facility] and informed patient of clinic's onsite resources.Patient agreeable to a follow-up appointment at [facility].  Informed patient clinic has an onsite Pharmacy, informed patient of Pharmacy hours, pharmacy resources, and encouraged patient to pick up discharge medications at the Pharmacy once discharged. Also encouraged patient to meet with one of clinic's onsite Financial Counselors after discharge to determine if he is eligible for the [employer] or [employer]. Patient verbalized understanding. Patient also indicated he is homeless. Encouraged patient to go to [facility] after discharge to determine if there is any availability at a shelter. Patient indicated he had done this in the past and not wanting to stay at a shelter. Patient has a hospital follow-up appointment scheduled on 10/13/15 with Dr. Janne Napoleon. AVS updated. Olga Coaster, RN CM updated.

## 2015-10-07 NOTE — Discharge Summary (Signed)
Physician Discharge Summary  Brian [name] WJX:[id number] DOB: [birth year] DOA: 09/30/2015  PCP: [name] Patient  Admit date: 09/30/2015 Discharge date: 10/07/2015  Time spent: 35 minutes  Recommendations for Outpatient Follow-up:  Patient will be discharged to home (his brother's house) - encourage a halfway house program.  Follow-up with your doctor ([facility]) within 1 week of discharge.  Patient should continue medications as prescribed.  Patient should follow a regular diet.    Discharge Diagnoses:  Principal Problem:   Acute respiratory failure with hypoxia and hypercarbia (HCC) Active Problems:   Alcohol dependence (HCC)   CAP (community acquired pneumonia)   COPD with acute exacerbation (HCC)   Discharge Condition: Stable  Diet recommendation: Regular  Filed Weights   10/05/15 1456 10/06/15 0446 10/07/15 0543  Weight: 88.4 kg (194 lb 14.2 oz) 88.179 kg (194 lb 6.4 oz) 88 kg (194 lb 0.1 oz)    History of present illness:  Per Dr. Izola Price - 60 y.o. male with COPD, chronic alcohol use, Polysubstance abuse, homelessness. Patient seen for shortness of breath that started about a week ago and has been worsening, associated with cough productive of yellow sputum.  Hospital Course:  Principal Problem:  Acute respiratory failure with hypoxia and hypercarbia (HCC) - secondary to acute exacerbation of COPD and LLL PNA, CAP, unknown pathogen - CT chest confirmed left lobe PNA and emphysematous changes  - pt reports feeling better this AM, still with mild exp wheezing  - Completed Rocephin and Zithromax day #7/7 - provide BD's scheduled and as needed - continue with Prednisone 30 mg daily for 5 more days - off oxygen   Active Problems:  Alcohol dependence (HCC) and with subsequent alcohol withdrawal - overall better but still with mild shakes - generally resolved, pt can go home    Homelessness - CM consulted for assistance  -  unfortunate and difficult situation for this pleasant gentleman    Hypertensive urgency with tachycardia  - from alcohol withdrawal - started labetalol 100 mg PO TID, added hydralazine PO TID - also continue hydralazine as needed  - SBP better - Continue limited number of Percocet for persistent headache   Obesity  - Body mass index is 38.67 kg/(m^2).  Procedures:  none  Consultations: none  Discharge Exam: Filed Vitals:   10/07/15 0543 10/07/15 1054  BP: 156/91 156/89  Pulse: 69 89  Temp: 98.7 F (37.1 C)   Resp: 18 18     General: Well developed, well nourished, NAD, appears stated age  HEENT: NCAT, PERRLA, EOMI, Anicteic Sclera, mucous membranes moist.  Neck: Supple, no JVD, no masses  Cardiovascular: S1 S2 auscultated, no rubs, murmurs or gallops. Regular rate and rhythm.  Respiratory: Clear to auscultation bilaterally with equal chest rise  Abdomen: Soft, nontender, nondistended, + bowel sounds  Extremities: warm dry without cyanosis clubbing or edema  Neuro: AAOx3, cranial nerves grossly intact. Strength 5/5 in patient's upper and lower extremities bilaterally  Skin: Without rashes exudates or nodules  Psych: Normal affect and demeanor with intact judgement and insight  Discharge Instructions      Discharge Instructions    Call MD for:  difficulty breathing, headache or visual disturbances    Complete by:  As directed      Call MD for:  persistant nausea and vomiting    Complete by:  As directed      Call MD for:  severe uncontrolled pain    Complete by:  As directed  Call MD for:  temperature >100.4    Complete by:  As directed      Diet - low sodium heart healthy    Complete by:  As directed      Discharge instructions    Complete by:  As directed   Patient will be discharged to home (his brother's house) - encourage a halfway house program.  Follow-up with your doctor ([facility]) within 1 week of discharge.   Patient should continue medications as prescribed.  Patient should follow a regular diet.     Increase activity slowly    Complete by:  As directed             Medication List    TAKE these medications        folic acid 1 MG tablet  Commonly known as:  FOLVITE  Take 1 tablet (1 mg total) by mouth daily.     hydrALAZINE 25 MG tablet  Commonly known as:  APRESOLINE  Take 1 tablet (25 mg total) by mouth every 8 (eight) hours.     ipratropium-albuterol 0.5-2.5 (3) MG/3ML Soln  Commonly known as:  DUONEB  Take 3 mLs by nebulization 2 (two) times daily.     labetalol 100 MG tablet  Commonly known as:  NORMODYNE  Take 1 tablet (100 mg total) by mouth 3 (three) times daily.     multivitamin with minerals Tabs tablet  Take 1 tablet by mouth daily.     oxyCODONE-acetaminophen 5-325 MG tablet  Commonly known as:  PERCOCET/ROXICET  Take 1 tablet by mouth once.     predniSONE 10 MG tablet  Commonly known as:  DELTASONE  Take 3 tablets (30 mg total) by mouth daily with breakfast. Until gone     thiamine 100 MG tablet  Take 1 tablet (100 mg total) by mouth daily.       No Known Allergies Follow-up Information    Follow up with [facility] On 10/13/2015.   Why:  Hospital follow-up appointment on 10/13/15 at 9:00 am with Dr. Julien Nordmann.   Contact information:   [address] [place] [address] [phone]       The results of significant diagnostics from this hospitalization (including imaging, microbiology, ancillary and laboratory) are listed below for reference.    Significant Diagnostic Studies: Ct Chest Wo Contrast  10/02/2015  CLINICAL DATA:  Shortness of Breath, cough, congestion, COPD EXAM: CT CHEST WITHOUT CONTRAST TECHNIQUE: Multidetector CT imaging of the chest was performed following the standard protocol without IV contrast. COMPARISON:  Chest x-ray 09/30/2015 FINDINGS: Cardiovascular: Atherosclerotic  calcifications of thoracic aorta are noted. Atherosclerotic calcifications of coronary arteries. Heart size within normal limits. No pericardial effusion. Mediastinum: There is no mediastinal hematoma or adenopathy. A precarinal lymph node measures 8 mm short-axis not pathologic by size criteria. No hilar adenopathy. Lungs/Pleura: Images of the lung parenchyma shows mild hyperinflation. Mild emphysematous changes are noted upper lobes. There is linear scarring or atelectasis in right upper lobe just anterior to major fissure. Mild atelectasis or scarring noted in right middle lobe and right base anterior medially. There is streaky small atelectasis or infiltrate in left base anterolaterally please see axial image 84 and 90 lung windows images. No segmental consolidation. No pulmonary edema. There is no bronchiectasis. Upper Abdomen: Visualized upper abdomen shows mild fatty infiltration of the liver. No adrenal gland mass is noted. Visualized unenhanced pancreas and spleen is unremarkable. Visualized upper kidneys are  unremarkable. Atherosclerotic calcifications of abdominal aorta. Musculoskeletal: No destructive bony lesions are noted. Sagittal images of the spine shows degenerative changes mid and lower thoracic spine. IMPRESSION: 1. No mediastinal hematoma or adenopathy. Atherosclerotic calcifications of thoracic aorta and coronary arteries. No pleural or pericardial effusion. 2. Mild hyperinflation. Mild emphysematous changes are noted bilateral upper lobes. There is linear atelectasis or scarring in right upper lobe. Mild atelectasis or scarring noted right middle lobe and right base anteromedially. Subtle mild streaky atelectasis or infiltrate in left base anterolaterally. No segmental consolidation. No pulmonary edema. 3. Degenerative changes thoracic spine. 4. No adrenal gland mass. 5. Mild fatty infiltration of the liver. Electronically Signed   By: Natasha Mead M.D.   On: 10/02/2015 11:18   Dg Chest  Portable 1 View  09/30/2015  CLINICAL DATA:  Short of breath for 1 week EXAM: PORTABLE CHEST 1 VIEW COMPARISON:  08/17/2015 FINDINGS: Normal heart size. Lungs are hyperaerated. Minimal patchy density at the left base. No pneumothorax. Normal vascularity. IMPRESSION: Mild patchy airspace disease at the left base. Followup PA and lateral chest X-ray is recommended in 3-4 weeks following trial of antibiotic therapy to ensure resolution and exclude underlying malignancy. Electronically Signed   By: Jolaine Click M.D.   On: 09/30/2015 16:57    Microbiology: Recent Results (from the past 240 hour(s))  Blood culture (routine x 2)     Status: None   Collection Time: 09/30/15  5:27 PM  Result Value Ref Range Status   Specimen Description BLOOD RIGHT ANTECUBITAL  Final   Special Requests BOTTLES DRAWN AEROBIC AND ANAEROBIC 5CC  Final   Culture NO GROWTH 5 DAYS  Final   Report Status 10/05/2015 FINAL  Final  Blood culture (routine x 2)     Status: None   Collection Time: 09/30/15  5:32 PM  Result Value Ref Range Status   Specimen Description BLOOD RIGHT HAND  Final   Special Requests BOTTLES DRAWN AEROBIC ONLY 5CC  Final   Culture NO GROWTH 5 DAYS  Final   Report Status 10/05/2015 FINAL  Final  MRSA PCR Screening     Status: None   Collection Time: 09/30/15  8:30 PM  Result Value Ref Range Status   MRSA by PCR NEGATIVE NEGATIVE Final    Comment:        The GeneXpert MRSA Assay (FDA approved for NASAL specimens only), is one component of a comprehensive MRSA colonization surveillance program. It is not intended to diagnose MRSA infection nor to guide or monitor treatment for MRSA infections.      Labs: Basic Metabolic Panel:  Recent Labs Lab 10/02/15 0249 10/03/15 0218 10/04/15 0245 10/05/15 0349 10/06/15 0400 10/07/15 0326  NA 138 137 139 139 141 143  K 4.7 4.7 4.1 4.1 4.3 4.8  CL 102 102 100* 99* 100* 103  CO2 31 28 33* 35* 31 35*  GLUCOSE 173* 157* 118* 91 86 85  BUN 21*  22* 19 20 25* 19  CREATININE 0.85 0.83 0.84 0.82 1.00 0.90  CALCIUM 9.5 9.5 9.1 9.3 9.2 9.6  MG 2.0  --  2.0  --   --   --    Liver Function Tests:  Recent Labs Lab 09/30/15 1635 10/02/15 0249 10/04/15 0245  AST 38 19 29  ALT 53 33 46  ALKPHOS 47 38 31*  BILITOT 1.2 0.3 0.5  PROT 7.7 6.2* 5.6*  ALBUMIN 4.2 3.2* 3.0*    Recent Labs Lab 09/30/15 1635  LIPASE 19  No results for input(s): AMMONIA in the last 168 hours. CBC:  Recent Labs Lab 09/30/15 1635  10/03/15 0218 10/04/15 0245 10/05/15 0349 10/06/15 0400 10/07/15 0326  WBC 11.5*  < > 13.3* 11.0* 9.5 9.6 9.6  NEUTROABS 8.3*  --   --   --   --   --   --   HGB 17.3*  < > 16.2 15.6 16.0 17.1* 16.6  HCT 52.3*  < > 48.5 48.6 50.0 53.9* 51.6  MCV 95.8  < > 97.4 100.0 96.3 96.4 98.5  PLT 179  < > 223 212 207 228 250  < > = values in this interval not displayed. Cardiac Enzymes: No results for input(s): CKTOTAL, CKMB, CKMBINDEX, TROPONINI in the last 168 hours. BNP: BNP (last 3 results)  Recent Labs  09/30/15 1635  BNP 64.8    ProBNP (last 3 results) No results for input(s): PROBNP in the last 8760 hours.  CBG:  Recent Labs Lab 10/06/15 1131 10/06/15 1709 10/06/15 2158 10/07/15 0613 10/07/15 1129  GLUCAP 110* 162* 136* 82 105*       Signed:  Jonah Blue  [facility] 10/07/2015, 12:20 PM

## 2015-10-07 NOTE — Progress Notes (Signed)
Per Soc Worker Sarah, unable to get the patient into a rehab facility at this time and was given resources for Outpatient treatment for ETOH; CM spoke to [place] with the Transitional Care Coordination team at the [facility] and [facility] - pt is to go there to get his medication at discharge;  Also pt is thinking about staying with his brother at discharge; Alexis Goodell [phone]

## 2015-10-07 NOTE — Clinical Social Work Note (Signed)
Clinical Social Work Assessment  Patient Details  Name: Brian [name] MRN: [id number] Date of Birth: November 20, 1955  Date of referral:  10/06/15               Reason for consult:  Substance Use/ETOH Abuse, Discharge Planning, Transportation, Housing Concerns/Homelessness                Permission sought to share information with:    Permission granted to share information::  No  Name::        Agency::     Relationship::     Contact Information:     Housing/Transportation Living arrangements for the past 2 months:  Homeless Source of Information:  Patient, Medical Team Patient Interpreter Needed:  None Criminal Activity/Legal Involvement Pertinent to Current Situation/Hospitalization:  No - Comment as needed Significant Relationships:  Siblings Lives with:  Self Do you feel safe going back to the place where you live?  Yes Need for family participation in patient care:  No (Coment)  Care giving concerns:  Patient is homeless and interested in substance abuse treatment following discharge. Also needs a bus pass.   Social Worker assessment / plan:  CSW met with patient. No supports at bedside. CSW introduced role and inquired about interest in substance abuse treatment. Patient stated that he has decided to stay with his brother and sister-in-law when he discharges but asked for a phone number to call for substance abuse treatment if this living situation did not work out. CSW provided list of outpatient and residential substance abuse resources with three options circled that the Substance Abuse CSW recommended since patient does not have insurance. Patient also agreeable to shelter resources. CSW provided. Patient at first stated that his brother would pick him up but later the RN called and said he needed a bus pass. CSW provided. No further concerns. MD notified. Patient is discharging today so CSW will sign off.  Employment status:  Unemployed Forensic scientist:  Self Pay  (Medicaid Pending) PT Recommendations:  Not assessed at this time Information / Referral to community resources:  Residential Substance Abuse Treatment Options, Outpatient Substance Abuse Treatment Options, Shelter  Patient/Family's Response to care:  Patient accepting of substance abuse and shelter resources. Patient's brother somewhat supportive and involved in patient's care. Patient appreciated social work intervention.  Patient/Family's Understanding of and Emotional Response to Diagnosis, Current Treatment, and Prognosis:  Patient understands need for substance abuse and shelter resources due to the impact they are having on his health.  Emotional Assessment Appearance:  Appears stated age Attitude/Demeanor/Rapport:   (Pleasant) Affect (typically observed):  Accepting, Appropriate, Calm, Pleasant Orientation:  Oriented to Self, Oriented to Place, Oriented to  Time, Oriented to Situation Alcohol / Substance use:  Alcohol Use, Illicit Drugs Psych involvement (Current and /or in the community):  No (Comment)  Discharge Needs  Concerns to be addressed:  Basic Needs, Homelessness, Substance Abuse Concerns Readmission within the last 30 days:  No Current discharge risk:  Inadequate Financial Supports, Homeless, Lack of support system, Lives alone, Substance Abuse Barriers to Discharge:  Homeless with medical needs, Inadequate or no insurance   Candie Chroman, LCSW 10/07/2015, 12:39 PM

## 2015-10-07 NOTE — Progress Notes (Signed)
Patient is discharge to home accompanied by patient's family member.Prescriptions and  discharge instructions given . Patient verbalizes understanding. All personal belongings given. Telemetry box and IV removed prior to discharge and site in good condition.

## 2015-10-13 ENCOUNTER — Inpatient Hospital Stay: Payer: Self-pay | Admitting: Internal Medicine

## 2015-10-18 ENCOUNTER — Inpatient Hospital Stay: Payer: Self-pay | Admitting: Internal Medicine

## 2015-10-20 ENCOUNTER — Encounter (HOSPITAL_COMMUNITY): Payer: Self-pay | Admitting: Emergency Medicine

## 2015-10-20 ENCOUNTER — Observation Stay (HOSPITAL_COMMUNITY)
Admission: EM | Admit: 2015-10-20 | Discharge: 2015-10-22 | Disposition: A | Discharge status: Home / Self Care | Payer: Self-pay | Attending: Internal Medicine | Admitting: Internal Medicine

## 2015-10-20 ENCOUNTER — Emergency Department (HOSPITAL_COMMUNITY): Payer: Self-pay

## 2015-10-20 DIAGNOSIS — J441 Chronic obstructive pulmonary disease with (acute) exacerbation: Secondary | ICD-10-CM | POA: Insufficient documentation

## 2015-10-20 DIAGNOSIS — Z7982 Long term (current) use of aspirin: Secondary | ICD-10-CM | POA: Insufficient documentation

## 2015-10-20 DIAGNOSIS — Z5900 Homelessness unspecified: Secondary | ICD-10-CM

## 2015-10-20 DIAGNOSIS — F17210 Nicotine dependence, cigarettes, uncomplicated: Secondary | ICD-10-CM | POA: Insufficient documentation

## 2015-10-20 DIAGNOSIS — I509 Heart failure, unspecified: Secondary | ICD-10-CM | POA: Insufficient documentation

## 2015-10-20 DIAGNOSIS — M1990 Unspecified osteoarthritis, unspecified site: Secondary | ICD-10-CM | POA: Insufficient documentation

## 2015-10-20 DIAGNOSIS — F1910 Other psychoactive substance abuse, uncomplicated: Secondary | ICD-10-CM | POA: Diagnosis present

## 2015-10-20 DIAGNOSIS — E876 Hypokalemia: Secondary | ICD-10-CM | POA: Insufficient documentation

## 2015-10-20 DIAGNOSIS — F1410 Cocaine abuse, uncomplicated: Secondary | ICD-10-CM | POA: Insufficient documentation

## 2015-10-20 DIAGNOSIS — I700 Atherosclerosis of aorta: Secondary | ICD-10-CM | POA: Insufficient documentation

## 2015-10-20 DIAGNOSIS — F1010 Alcohol abuse, uncomplicated: Secondary | ICD-10-CM | POA: Insufficient documentation

## 2015-10-20 DIAGNOSIS — Y95 Nosocomial condition: Secondary | ICD-10-CM | POA: Insufficient documentation

## 2015-10-20 DIAGNOSIS — J189 Pneumonia, unspecified organism: Secondary | ICD-10-CM | POA: Insufficient documentation

## 2015-10-20 DIAGNOSIS — J440 Chronic obstructive pulmonary disease with acute lower respiratory infection: Principal | ICD-10-CM | POA: Insufficient documentation

## 2015-10-20 LAB — RAPID URINE DRUG SCREEN, HOSP PERFORMED
Amphetamines: NOT DETECTED
BENZODIAZEPINES: NOT DETECTED
Barbiturates: NOT DETECTED
COCAINE: POSITIVE — AB
Opiates: NOT DETECTED
Tetrahydrocannabinol: NOT DETECTED

## 2015-10-20 LAB — DIFFERENTIAL
Basophils Absolute: 0 10*3/uL (ref 0.0–0.1)
Basophils Relative: 1 %
EOS ABS: 0.1 10*3/uL (ref 0.0–0.7)
EOS PCT: 2 %
LYMPHS ABS: 2.8 10*3/uL (ref 0.7–4.0)
LYMPHS PCT: 39 %
MONOS PCT: 7 %
Monocytes Absolute: 0.5 10*3/uL (ref 0.1–1.0)
Neutro Abs: 3.8 10*3/uL (ref 1.7–7.7)
Neutrophils Relative %: 51 %

## 2015-10-20 LAB — CBC
HCT: 47.2 % (ref 39.0–52.0)
HEMATOCRIT: 47.8 % (ref 39.0–52.0)
HEMOGLOBIN: 15 g/dL (ref 13.0–17.0)
Hemoglobin: 15.6 g/dL (ref 13.0–17.0)
MCH: 31.6 pg (ref 26.0–34.0)
MCH: 31 pg (ref 26.0–34.0)
MCHC: 32.6 g/dL (ref 30.0–36.0)
MCHC: 31.8 g/dL (ref 30.0–36.0)
MCV: 97 fL (ref 78.0–100.0)
MCV: 97.5 fL (ref 78.0–100.0)
Platelets: 197 10*3/uL (ref 150–400)
Platelets: 178 10*3/uL (ref 150–400)
RBC: 4.93 MIL/uL (ref 4.22–5.81)
RBC: 4.84 MIL/uL (ref 4.22–5.81)
RDW: 13.6 % (ref 11.5–15.5)
RDW: 13.7 % (ref 11.5–15.5)
WBC: 7.6 10*3/uL (ref 4.0–10.5)
WBC: 7.4 10*3/uL (ref 4.0–10.5)

## 2015-10-20 LAB — BASIC METABOLIC PANEL
Anion gap: 10 (ref 5–15)
BUN: 13 mg/dL (ref 6–20)
CHLORIDE: 104 mmol/L (ref 101–111)
CO2: 24 mmol/L (ref 22–32)
Calcium: 9 mg/dL (ref 8.9–10.3)
Creatinine, Ser: 1.08 mg/dL (ref 0.61–1.24)
GFR calc Af Amer: >60 mL/min (ref >60)
GFR calc non Af Amer: >60 mL/min (ref >60)
GLUCOSE: 140 mg/dL — AB (ref 65–99)
POTASSIUM: 3.2 mmol/L — AB (ref 3.5–5.1)
Sodium: 138 mmol/L (ref 135–145)

## 2015-10-20 LAB — TROPONIN I
Troponin I: <0.03 ng/mL (ref <0.03)
Troponin I: <0.03 ng/mL (ref <0.03)

## 2015-10-20 LAB — I-STAT TROPONIN, ED: Troponin i, poc: 0 ng/mL (ref 0.00–0.08)

## 2015-10-20 LAB — HIV ANTIBODY (ROUTINE TESTING W REFLEX): HIV Screen 4th Generation wRfx: NONREACTIVE

## 2015-10-20 LAB — CREATININE, SERUM
CREATININE: 0.82 mg/dL (ref 0.61–1.24)
GFR calc Af Amer: >60 mL/min (ref >60)

## 2015-10-20 LAB — D-DIMER, QUANTITATIVE (NOT AT ARMC): D DIMER QUANT: 0.3 ug{FEU}/mL (ref 0.00–0.50)

## 2015-10-20 LAB — STREP PNEUMONIAE URINARY ANTIGEN: STREP PNEUMO URINARY ANTIGEN: NEGATIVE

## 2015-10-20 LAB — I-STAT CG4 LACTIC ACID, ED: Lactic Acid, Venous: 1.49 mmol/L (ref 0.5–1.9)

## 2015-10-20 MED ORDER — SODIUM CHLORIDE 0.9 % IV SOLN
1000.0000 mL | Freq: Once | INTRAVENOUS | Status: AC
  Administered 2015-10-20: 1000 mL via INTRAVENOUS

## 2015-10-20 MED ORDER — POTASSIUM CHLORIDE CRYS ER 20 MEQ PO TBCR: 40.0000 meq | EXTENDED_RELEASE_TABLET | Freq: Once | ORAL | Status: DC

## 2015-10-20 MED ORDER — ALBUTEROL SULFATE (2.5 MG/3ML) 0.083% IN NEBU: 5.0000 mg | INHALATION_SOLUTION | RESPIRATORY_TRACT | Status: DC | PRN

## 2015-10-20 MED ORDER — ACETAMINOPHEN 325 MG PO TABS: 650.0000 mg | ORAL_TABLET | Freq: Four times a day (QID) | ORAL | Status: DC | PRN

## 2015-10-20 MED ORDER — SODIUM CHLORIDE 0.9 % IV SOLN
1500.0000 mg | Freq: Once | INTRAVENOUS | Status: AC
  Administered 2015-10-20: 1500 mg via INTRAVENOUS
  Filled 2015-10-20: qty 1500

## 2015-10-20 MED ORDER — ACETAMINOPHEN 650 MG RE SUPP: 650.0000 mg | Freq: Four times a day (QID) | RECTAL | Status: DC | PRN

## 2015-10-20 MED ORDER — ONDANSETRON HCL 4 MG PO TABS: 4.0000 mg | ORAL_TABLET | Freq: Four times a day (QID) | ORAL | Status: DC | PRN

## 2015-10-20 MED ORDER — FOLIC ACID 1 MG PO TABS
1.0000 mg | ORAL_TABLET | Freq: Every day | ORAL | Status: DC
  Administered 2015-10-20 – 2015-10-22 (×3): 1 mg via ORAL
  Filled 2015-10-20 (×3): qty 1

## 2015-10-20 MED ORDER — DEXTROSE 5 % IV SOLN
1.0000 g | Freq: Three times a day (TID) | INTRAVENOUS | Status: DC
  Administered 2015-10-20 – 2015-10-21 (×3): 1 g via INTRAVENOUS
  Filled 2015-10-20 (×4): qty 1

## 2015-10-20 MED ORDER — ADULT MULTIVITAMIN W/MINERALS CH: 1.0000 | ORAL_TABLET | Freq: Every day | ORAL | Status: DC

## 2015-10-20 MED ORDER — ALBUTEROL SULFATE (2.5 MG/3ML) 0.083% IN NEBU
5.0000 mg | INHALATION_SOLUTION | Freq: Once | RESPIRATORY_TRACT | Status: AC
  Administered 2015-10-20: 5 mg via RESPIRATORY_TRACT
  Filled 2015-10-20: qty 6

## 2015-10-20 MED ORDER — THIAMINE HCL 100 MG/ML IJ SOLN: 100.0000 mg | Freq: Every day | INTRAMUSCULAR | Status: DC

## 2015-10-20 MED ORDER — TRAMADOL HCL 50 MG PO TABS
50.0000 mg | ORAL_TABLET | Freq: Four times a day (QID) | ORAL | Status: DC | PRN
  Administered 2015-10-20 – 2015-10-22 (×3): 50 mg via ORAL
  Filled 2015-10-20 (×3): qty 1

## 2015-10-20 MED ORDER — LORAZEPAM 2 MG/ML IJ SOLN: 1.0000 mg | Freq: Four times a day (QID) | INTRAMUSCULAR | Status: DC | PRN

## 2015-10-20 MED ORDER — KETOROLAC TROMETHAMINE 30 MG/ML IJ SOLN
30.0000 mg | Freq: Once | INTRAMUSCULAR | Status: AC
  Administered 2015-10-20: 30 mg via INTRAVENOUS
  Filled 2015-10-20: qty 1

## 2015-10-20 MED ORDER — GUAIFENESIN ER 600 MG PO TB12
600.0000 mg | ORAL_TABLET | Freq: Two times a day (BID) | ORAL | Status: DC
  Administered 2015-10-20 – 2015-10-22 (×5): 600 mg via ORAL
  Filled 2015-10-20 (×5): qty 1

## 2015-10-20 MED ORDER — VITAMIN B-1 100 MG PO TABS
100.0000 mg | ORAL_TABLET | Freq: Every day | ORAL | Status: DC
  Administered 2015-10-20 – 2015-10-22 (×3): 100 mg via ORAL
  Filled 2015-10-20 (×3): qty 1

## 2015-10-20 MED ORDER — SODIUM CHLORIDE 0.9 % IV SOLN
1000.0000 mL | INTRAVENOUS | Status: DC
  Administered 2015-10-20: 1000 mL via INTRAVENOUS

## 2015-10-20 MED ORDER — ONDANSETRON HCL 4 MG/2ML IJ SOLN: 4.0000 mg | Freq: Four times a day (QID) | INTRAMUSCULAR | Status: DC | PRN

## 2015-10-20 MED ORDER — LORAZEPAM 1 MG PO TABS
1.0000 mg | ORAL_TABLET | Freq: Four times a day (QID) | ORAL | Status: DC | PRN
  Administered 2015-10-20 (×2): 1 mg via ORAL
  Filled 2015-10-20: qty 1

## 2015-10-20 MED ORDER — FOLIC ACID 1 MG PO TABS: 1.0000 mg | ORAL_TABLET | Freq: Every day | ORAL | Status: DC

## 2015-10-20 MED ORDER — ADULT MULTIVITAMIN W/MINERALS CH
1.0000 | ORAL_TABLET | Freq: Every day | ORAL | Status: DC
  Administered 2015-10-20 – 2015-10-22 (×3): 1 via ORAL
  Filled 2015-10-20 (×3): qty 1

## 2015-10-20 MED ORDER — ENOXAPARIN SODIUM 40 MG/0.4ML ~~LOC~~ SOLN
40.0000 mg | SUBCUTANEOUS | Status: DC
  Filled 2015-10-20: qty 0.4

## 2015-10-20 MED ORDER — DEXTROSE 5 % IV SOLN: 1.0000 g | Freq: Three times a day (TID) | INTRAVENOUS | Status: DC

## 2015-10-20 MED ORDER — DEXTROSE 5 % IV SOLN
2.0000 g | Freq: Once | INTRAVENOUS | Status: AC
  Administered 2015-10-20: 2 g via INTRAVENOUS
  Filled 2015-10-20: qty 2

## 2015-10-20 MED ORDER — ASPIRIN EC 81 MG PO TBEC
81.0000 mg | DELAYED_RELEASE_TABLET | Freq: Every day | ORAL | Status: DC
  Administered 2015-10-20 – 2015-10-22 (×3): 81 mg via ORAL
  Filled 2015-10-20 (×3): qty 1

## 2015-10-20 MED ORDER — LORAZEPAM 1 MG PO TABS: 0.0000 mg | ORAL_TABLET | Freq: Two times a day (BID) | ORAL | Status: DC

## 2015-10-20 MED ORDER — VANCOMYCIN HCL IN DEXTROSE 1-5 GM/200ML-% IV SOLN
1000.0000 mg | Freq: Two times a day (BID) | INTRAVENOUS | Status: DC
  Administered 2015-10-20 – 2015-10-21 (×2): 1000 mg via INTRAVENOUS
  Filled 2015-10-20 (×3): qty 200

## 2015-10-20 MED ORDER — SODIUM CHLORIDE 0.9 % IV SOLN: INTRAVENOUS | Status: DC

## 2015-10-20 MED ORDER — VANCOMYCIN HCL IN DEXTROSE 1-5 GM/200ML-% IV SOLN: 1000.0000 mg | Freq: Once | INTRAVENOUS | Status: DC

## 2015-10-20 MED ORDER — LORAZEPAM 1 MG PO TABS
0.0000 mg | ORAL_TABLET | Freq: Four times a day (QID) | ORAL | Status: DC
  Filled 2015-10-20: qty 1

## 2015-10-20 MED ORDER — SODIUM CHLORIDE 0.9 % IV SOLN
INTRAVENOUS | Status: DC
  Administered 2015-10-20: 08:00:00 via INTRAVENOUS

## 2015-10-20 NOTE — Progress Notes (Signed)
rn gave pt a sputum culture cup, asked pt to give sputum sample.
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
## 2015-10-20 NOTE — Progress Notes (Signed)
Pt refusing Lovenox. SCDs ordered.  KJKG, NP [facility]

## 2015-10-20 NOTE — H&P (Signed)
History and Physical    Brian [name]:[id number] DOB: [birth year] DOA: 10/20/2015  PCP: [name] Patient  Patient coming from: Homeless.  Chief Complaint: Chest pain and productive cough.  HPI: LARENCE [name] is a 60 y.o. male with COPD and polysubstance abuse including cocaine and alcohol who was recently admitted 2 weeks ago for COPD exacerbation presents to the ER because of left-sided chest pain ongoing for last 4-5 days which is pleuritic in nature with productive cough. Patient has been having some discolored phlegm. Denies any fever or chills. In the ER chest x-ray shows infiltrates concerning for pneumonia. EKG shows nonspecific findings. Patient is mildly hypotensive initially and fluid bolus was again following which patient's blood pressure improved. Patient states he last took cocaine 10 days ago. Drinks alcohol everyday.   ED Course: Empiric antibiotics for healthcare associated pneumonia was given and fluid bolus for mild hypotension.  Review of Systems: As per HPI, rest all negative.   Past Medical History  Diagnosis Date  . Heart failure   . Arthritis   . Alcohol abuse     Heavy alcohol abuse and homelessness  . Tobacco abuse   . Cocaine abuse     And crack   . Homelessness   . COPD (chronic obstructive pulmonary disease) (HCC)   . Suicide attempt (HCC)   . CHF (congestive heart failure) [facility])     Past Surgical History  Procedure Laterality Date  . Inner ear surgery       reports that he has been smoking Cigarettes.  He has been smoking about 2.00 packs per day. He has never used smokeless tobacco. He reports that he drinks alcohol. He reports that he does not use illicit drugs.  No Known Allergies  Family History  Problem Relation Age of Onset  . Coronary artery disease    . Diabetes    . Cancer Sister     Prior to Admission medications   Medication Sig Start Date End Date Taking? Authorizing Provider  folic acid (FOLVITE) 1 MG tablet  Take 1 tablet (1 mg total) by mouth daily. 10/07/15   Jonah Blue, MD  hydrALAZINE (APRESOLINE) 25 MG tablet Take 1 tablet (25 mg total) by mouth every 8 (eight) hours. 10/07/15   Jonah Blue, MD  ipratropium-albuterol (DUONEB) 0.5-2.5 (3) MG/3ML SOLN Take 3 mLs by nebulization 2 (two) times daily. 10/07/15   Jonah Blue, MD  labetalol (NORMODYNE) 100 MG tablet Take 1 tablet (100 mg total) by mouth 3 (three) times daily. 10/07/15   Jonah Blue, MD  Multiple Vitamin (MULTIVITAMIN WITH MINERALS) TABS tablet Take 1 tablet by mouth daily. 10/07/15   Jonah Blue, MD  oxyCODONE-acetaminophen (PERCOCET/ROXICET) 5-325 MG tablet Take 1 tablet by mouth once. 10/07/15   Jonah Blue, MD  predniSONE (DELTASONE) 10 MG tablet Take 3 tablets (30 mg total) by mouth daily with breakfast. Until gone 10/07/15   Jonah Blue, MD  thiamine 100 MG tablet Take 1 tablet (100 mg total) by mouth daily. 10/07/15   Jonah Blue, MD    Physical Exam: Filed Vitals:   10/20/15 0400 10/20/15 0445 10/20/15 0500 10/20/15 0638  BP: 79/57 109/73 113/60 118/71  Pulse: 98 90 90 94  Temp:    98.7 F (37.1 C)  TempSrc:    Oral  Resp: 32 20  20  Height:     (1.676 m)  Weight:    200 lb 8 oz (90.946 kg)  SpO2: 92% 96% 95% 95%  Constitutional: Not in distress. Filed Vitals:   10/20/15 0400 10/20/15 0445 10/20/15 0500 10/20/15 0638  BP: 79/57 109/73 113/60 118/71  Pulse: 98 90 90 94  Temp:    98.7 F (37.1 C)  TempSrc:    Oral  Resp: 32 20  20  Height:    5\' 6"  (1.676 m)  Weight:    200 lb 8 oz (90.946 kg)  SpO2: 92% 96% 95% 95%   Eyes: Anicteric no pallor. ENMT: No discharge from the ears eyes nose and mouth. Neck: No mass felt. No JVD appreciated. Respiratory: No rhonchi or crepitations. Cardiovascular: S1-S2 heard. Abdomen: Soft nontender bowel sounds present. Musculoskeletal: No edema. Skin: No rash. Neurologic: Alert awake oriented to time place and person. Moves all  extremities. Psychiatric: Appears normal.   Labs on Admission: I have personally reviewed following labs and imaging studies  CBC:  Recent Labs Lab 10/20/15 0329 10/20/15 0421  WBC 7.6  --   NEUTROABS  --  3.8  HGB 15.6  --   HCT 47.8  --   MCV 97.0  --   PLT 197  --    Basic Metabolic Panel:  Recent Labs Lab 10/20/15 0329  NA 138  K 3.2*  CL 104  CO2 24  GLUCOSE 140*  BUN 13  CREATININE 1.08  CALCIUM 9.0   GFR: Estimated Creatinine Clearance: 79.6 mL/min (by C-G formula based on Cr of 1.08). Liver Function Tests: No results for input(s): AST, ALT, ALKPHOS, BILITOT, PROT, ALBUMIN in the last 168 hours. No results for input(s): LIPASE, AMYLASE in the last 168 hours. No results for input(s): AMMONIA in the last 168 hours. Coagulation Profile: No results for input(s): INR, PROTIME in the last 168 hours. Cardiac Enzymes: No results for input(s): CKTOTAL, CKMB, CKMBINDEX, TROPONINI in the last 168 hours. BNP (last 3 results) No results for input(s): PROBNP in the last 8760 hours. HbA1C: No results for input(s): HGBA1C in the last 72 hours. CBG: No results for input(s): GLUCAP in the last 168 hours. Lipid Profile: No results for input(s): CHOL, HDL, LDLCALC, TRIG, CHOLHDL, LDLDIRECT in the last 72 hours. Thyroid Function Tests: No results for input(s): TSH, T4TOTAL, FREET4, T3FREE, THYROIDAB in the last 72 hours. Anemia Panel: No results for input(s): VITAMINB12, FOLATE, FERRITIN, TIBC, IRON, RETICCTPCT in the last 72 hours. Urine analysis:    Component Value Date/Time   COLORURINE Yellow 10/10/2012 0320   COLORURINE YELLOW 08/27/2012 0659   APPEARANCEUR Clear 10/10/2012 0320   APPEARANCEUR CLEAR 08/27/2012 0659   LABSPEC 1.009 10/10/2012 0320   LABSPEC 1.006 08/27/2012 0659   PHURINE 5.0 10/10/2012 0320   PHURINE 5.5 08/27/2012 0659   GLUCOSEU Negative 10/10/2012 0320   GLUCOSEU NEGATIVE 08/27/2012 0659   HGBUR Negative 10/10/2012 0320   HGBUR NEGATIVE  08/27/2012 0659   BILIRUBINUR Negative 10/10/2012 0320   BILIRUBINUR NEGATIVE 08/27/2012 0659   KETONESUR Negative 10/10/2012 0320   KETONESUR NEGATIVE 08/27/2012 0659   PROTEINUR Negative 10/10/2012 0320   PROTEINUR NEGATIVE 08/27/2012 0659   UROBILINOGEN 0.2 08/27/2012 0659   NITRITE Negative 10/10/2012 0320   NITRITE NEGATIVE 08/27/2012 0659   LEUKOCYTESUR Negative 10/10/2012 0320   LEUKOCYTESUR NEGATIVE 08/27/2012 0659   Sepsis Labs: @LABRCNTIP (procalcitonin:4,lacticidven:4) )No results found for this or any previous visit (from the past 240 hour(s)).   Radiological Exams on Admission: Dg Chest Portable 1 View  10/20/2015  CLINICAL DATA:  Left-sided chest pain and shortness of breath. Admission 2 weeks ago for pneumonia. EXAM: PORTABLE CHEST 1 VIEW  COMPARISON:  CT chest 10/02/2015.  Chest 09/30/2015. FINDINGS: Persistent linear infiltration in the left lung base. Developing infiltration now in the right lung base. Changes suggest atelectasis or multifocal pneumonia. No blunting of costophrenic angles. No pneumothorax. Mediastinal contours appear intact. Normal heart size and pulmonary vascularity. Calcification of the aorta. IMPRESSION: Persistent linear infiltration in the left lung base with new infiltration in the right lung base. Electronically Signed   By: Burman Nieves M.D.   On: 10/20/2015 04:08    EKG: Independently reviewed. Normal sinus rhythm.  Assessment/Plan Principal Problem:   HCAP (healthcare-associated pneumonia) Active Problems:   COPD (chronic obstructive pulmonary disease) (HCC)   Polysubstance abuse   Pneumonia    1. Healthcare associated pneumonia - with patient recently admitted 2 weeks ago and chest x-ray showing new infiltrates and patient having pleuritic-type of chest pain patient has been placed on empiric antibiotics for healthcare associated pneumonia. Initially mildly hypotensive but improved with fluids. Patient does not look  septic. 2. Pleuritic-type of chest pain - probably from pneumonia. However since patient has polysubstance abuse including cocaine use IV Ativan as well as d-dimer, check 2-D echo. Aspirin. 3. Alcohol abuse - CIWA protocol. 4. Cocaine abuse - urine drug screen pending. Patient advised to quit these habits. 5. COPD - presently not wheezing. 6. Homeless - social work consult.   DVT prophylaxis: Lovenox. Code Status: Full code.  Family Communication: No family at the bedside.  Disposition Plan: To be determined.  Consults called: None.  Admission status: Observation telemetry.    Eduard Clos MD [facility] Pager [phone].  If 7PM-7AM, please contact night-coverage [email or web address] Password [facility]  10/20/2015, 7:28 AM

## 2015-10-20 NOTE — ED Notes (Signed)
[place] w/ c/o left sided chest pain and sob.  Reports that he had been admitted for pneumonia two weeks ago, Pt was on duo neb upon arrival, also received 5 of albuteral.  He received 324 of ASA, 2 nitro (which decreased pain from 9 to an 8), He was also given 450mg  NS in route.  EKG was unremarkable.
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
## 2015-10-20 NOTE — Progress Notes (Signed)
Pt arrived to 5M18 from [facility]. Pt alert and oriented x 4. Complaining of headache pain. MD notified. Will continue to monitor.

## 2015-10-20 NOTE — Progress Notes (Signed)
Pt has still been unable to provide sputum sample.

## 2015-10-20 NOTE — Progress Notes (Signed)
Pt getting irritated when bed alarm goes off when he goes from lying to sitting on side of bed. Pt has been complaint and worn yellow socks. Pt stating if the bed alarm keeps going off he will "get up and leave". rn has watched pt walk twice and pt has been steady on his feet, pt says "I just have to walk slowly".  rn agreed to leave bed alarm off if pt will not get up and walk around the room. Pt reports he just wants to sit on the side of the bed and use the urinal and not have the bed alarm go off.

## 2015-10-20 NOTE — Progress Notes (Signed)
Pharmacy Antibiotic Note  Brian [name] is a 60 y.o. male admitted on 10/20/2015 with HCAP.  Pharmacy has been consulted for Vancomycin and Cefepime dosing. Pt was recently d/c from hospital 10/07/15 after 7 days of Azithromycin and Rocephin for CAP. Sent home on po prednisone x 5 more days.  Pt received Cefepime 2gm in ED ~0430 and Vancomycin 1500mg  ~0500  Plan: Cefepime1gm IV q8h Vancomycin 1gm IV q12h Will f/u micro data, renal function, and pt's clinical condition Vanc trough prn   Height: 5\' 6"  (167.6 cm) Weight: 195 lb (88.451 kg) IBW/kg (Calculated) : 63.8  Temp (24hrs), Avg:98.5 F (36.9 C), Min:98.5 F (36.9 C), Max:98.5 F (36.9 C)   Recent Labs Lab 10/20/15 0329 10/20/15 0439  WBC 7.6  --   CREATININE 1.08  --   LATICACIDVEN  --  1.49    Estimated Creatinine Clearance: 78.7 mL/min (by C-G formula based on Cr of 1.08).    No Known Allergies  Antimicrobials this admission: 7/12 Vanc >>  7/12 Cefepime >>   Dose adjustments this admission: n/a  Microbiology results: 7/12 BCx x2:   Thank you for allowing pharmacy to be a part of this patient's care.  Christoper Fabian, PharmD, BCPS Clinical pharmacist, pager [phone] 10/20/2015 6:18 AM

## 2015-10-21 ENCOUNTER — Observation Stay (HOSPITAL_COMMUNITY): Payer: MEDICAID

## 2015-10-21 DIAGNOSIS — F1910 Other psychoactive substance abuse, uncomplicated: Secondary | ICD-10-CM

## 2015-10-21 LAB — CBC
HEMATOCRIT: 49 % (ref 39.0–52.0)
Hemoglobin: 15.7 g/dL (ref 13.0–17.0)
MCH: 31.4 pg (ref 26.0–34.0)
MCHC: 32 g/dL (ref 30.0–36.0)
MCV: 98 fL (ref 78.0–100.0)
Platelets: 166 10*3/uL (ref 150–400)
RBC: 5 MIL/uL (ref 4.22–5.81)
RDW: 13.5 % (ref 11.5–15.5)
WBC: 9.9 10*3/uL (ref 4.0–10.5)

## 2015-10-21 LAB — ECHOCARDIOGRAM COMPLETE
Height: 66 in
Weight: 3208 oz

## 2015-10-21 LAB — BASIC METABOLIC PANEL
Anion gap: 8 (ref 5–15)
BUN: 8 mg/dL (ref 6–20)
CHLORIDE: 102 mmol/L (ref 101–111)
CO2: 28 mmol/L (ref 22–32)
Calcium: 8.7 mg/dL — ABNORMAL LOW (ref 8.9–10.3)
Creatinine, Ser: 0.71 mg/dL (ref 0.61–1.24)
GFR calc Af Amer: >60 mL/min (ref >60)
GFR calc non Af Amer: >60 mL/min (ref >60)
GLUCOSE: 162 mg/dL — AB (ref 65–99)
POTASSIUM: 4 mmol/L (ref 3.5–5.1)
Sodium: 138 mmol/L (ref 135–145)

## 2015-10-21 LAB — EXPECTORATED SPUTUM ASSESSMENT W GRAM STAIN, RFLX TO RESP C

## 2015-10-21 LAB — EXPECTORATED SPUTUM ASSESSMENT W REFEX TO RESP CULTURE

## 2015-10-21 LAB — LEGIONELLA PNEUMOPHILA SEROGP 1 UR AG: L. PNEUMOPHILA SEROGP 1 UR AG: NEGATIVE

## 2015-10-21 MED ORDER — AMOXICILLIN-POT CLAVULANATE 875-125 MG PO TABS
1.0000 | ORAL_TABLET | Freq: Two times a day (BID) | ORAL | Status: DC
  Administered 2015-10-21 – 2015-10-22 (×3): 1 via ORAL
  Filled 2015-10-21 (×3): qty 1

## 2015-10-21 MED ORDER — PERFLUTREN LIPID MICROSPHERE
1.0000 mL | INTRAVENOUS | Status: AC | PRN
  Filled 2015-10-21: qty 10

## 2015-10-21 MED ORDER — HYDRALAZINE HCL 25 MG PO TABS
25.0000 mg | ORAL_TABLET | Freq: Three times a day (TID) | ORAL | Status: DC
Start: 2015-10-21 — End: 2015-10-22
  Administered 2015-10-21 – 2015-10-22 (×4): 25 mg via ORAL
  Filled 2015-10-21 (×4): qty 1

## 2015-10-21 MED ORDER — LABETALOL HCL 100 MG PO TABS
100.0000 mg | ORAL_TABLET | Freq: Three times a day (TID) | ORAL | Status: DC
  Administered 2015-10-21 (×3): 100 mg via ORAL
  Filled 2015-10-21 (×3): qty 1

## 2015-10-21 NOTE — Progress Notes (Signed)
Echocardiogram 2D Echocardiogram has been performed.  Brian [name] 10/21/2015, 3:43 PM

## 2015-10-21 NOTE — Progress Notes (Signed)
PROGRESS NOTE    Brian [name]  AVW:[id number] DOB: May 08, 1955 DOA: 10/20/2015 PCP: [name] Patient   Outpatient Specialists:     Brief Narrative:  Brian [name] is a 60 y.o. male with COPD and polysubstance abuse including cocaine and alcohol who was recently admitted 2 weeks ago for COPD exacerbation presents to the ER because of left-sided chest pain ongoing for last 4-5 days which is pleuritic in nature with productive cough. Patient has been having some discolored phlegm. Denies any fever or chills. In the ER chest x-ray shows infiltrates concerning for pneumonia. EKG shows nonspecific findings. Patient is mildly hypotensive initially and fluid bolus was again following which patient's blood pressure improved. Patient states he last took cocaine 10 days ago. Drinks alcohol everyday.     Assessment & Plan:   Principal Problem:   HCAP (healthcare-associated pneumonia) Active Problems:   COPD (chronic obstructive pulmonary disease) (HCC)   Polysubstance abuse   Pneumonia   Healthcare associated pneumonia - with patient recently admitted 2 weeks ago and chest x-ray showing new infiltrates and patient having pleuritic-type of chest pain patient has been placed on empiric antibiotics for healthcare associated pneumonia. -? If residual-- 6 week follow up x ray for resolution -PO augmentin  Pleuritic-type of chest pain -  d-dimer negative check 2-D echo Aspirin.  Alcohol abuse - CIWA protocol.  Cocaine abuse - Patient advised to quit these habits.  COPD - presently not wheezing. Nebs mucinex  Homeless - social work consult   DVT prophylaxis:  SCD's- refused lovenox  Code Status: Full Code   Family Communication: patient  Disposition Plan:  D/c in AM   Consultants:     Procedures:       Subjective: Still with some SOB but much improved  Objective: Filed Vitals:   10/21/15 0120 10/21/15 0515 10/21/15 0800 10/21/15 0931  BP: 163/95 162/97  159/93 159/121  Pulse: 78 83 80 90  Temp: 98.7 F (37.1 C) 98.4 F (36.9 C) 98.6 F (37 C) 98.9 F (37.2 C)  TempSrc: Oral Oral Oral Oral  Resp: Height:      Weight:      SpO2: 97% 95% 96% 96%    Intake/Output Summary (Last 24 hours) at 10/21/15 1413 Last data filed at 10/21/15 1116  Gross per 24 hour  Intake    360 ml  Output   2850 ml  Net  -2490 ml   Filed Weights   10/20/15 0331 10/20/15 0638  Weight: 88.451 kg (195 lb) 90.946 kg (200 lb 8 oz)    Examination:  General exam: Appears calm and comfortable  Respiratory system: Coarse breath sounds Cardiovascular system: S1 & S2 heard, RRR. No JVD, murmurs, rubs, gallops or clicks. No pedal edema. Gastrointestinal system: Abdomen is nondistended, soft and nontender. No organomegaly or masses felt. Normal bowel sounds heard. Central nervous system: Alert and oriented. No focal neurological deficits.      Data Reviewed: I have personally reviewed following labs and imaging studies  CBC:  Recent Labs Lab 10/20/15 0329 10/20/15 0421 10/20/15 1126 10/21/15 0823  WBC 7.6  --  7.4 9.9  NEUTROABS  --  3.8  --   --   HGB 15.6  --  15.0 15.7  HCT 47.8  --  47.2 49.0  MCV 97.0  --  97.5 98.0  PLT 197  --  178 166   Basic Metabolic Panel:  Recent Labs Lab 10/20/15 0329 10/20/15 1126 10/21/15  0823  NA 138  --  138  K 3.2*  --  4.0  CL 104  --  102  CO2 24  --  28  GLUCOSE 140*  --  162*  BUN 13  --  8  CREATININE 1.08 0.82 0.71  CALCIUM 9.0  --  8.7*   GFR: Estimated Creatinine Clearance: 107.5 mL/min (by C-G formula based on Cr of 0.71). Liver Function Tests: No results for input(s): AST, ALT, ALKPHOS, BILITOT, PROT, ALBUMIN in the last 168 hours. No results for input(s): LIPASE, AMYLASE in the last 168 hours. No results for input(s): AMMONIA in the last 168 hours. Coagulation Profile: No results for input(s): INR, PROTIME in the last 168 hours. Cardiac Enzymes:  Recent Labs Lab  10/20/15 1126 10/20/15 1407 10/20/15 1959  TROPONINI <0.03 <0.03 <0.03   BNP (last 3 results) No results for input(s): PROBNP in the last 8760 hours. HbA1C: No results for input(s): HGBA1C in the last 72 hours. CBG: No results for input(s): GLUCAP in the last 168 hours. Lipid Profile: No results for input(s): CHOL, HDL, LDLCALC, TRIG, CHOLHDL, LDLDIRECT in the last 72 hours. Thyroid Function Tests: No results for input(s): TSH, T4TOTAL, FREET4, T3FREE, THYROIDAB in the last 72 hours. Anemia Panel: No results for input(s): VITAMINB12, FOLATE, FERRITIN, TIBC, IRON, RETICCTPCT in the last 72 hours. Urine analysis:    Component Value Date/Time   COLORURINE Yellow 10/10/2012 0320   COLORURINE YELLOW 08/27/2012 0659   APPEARANCEUR Clear 10/10/2012 0320   APPEARANCEUR CLEAR 08/27/2012 0659   LABSPEC 1.009 10/10/2012 0320   LABSPEC 1.006 08/27/2012 0659   PHURINE 5.0 10/10/2012 0320   PHURINE 5.5 08/27/2012 0659   GLUCOSEU Negative 10/10/2012 0320   GLUCOSEU NEGATIVE 08/27/2012 0659   HGBUR Negative 10/10/2012 0320   HGBUR NEGATIVE 08/27/2012 0659   BILIRUBINUR Negative 10/10/2012 0320   BILIRUBINUR NEGATIVE 08/27/2012 0659   KETONESUR Negative 10/10/2012 0320   KETONESUR NEGATIVE 08/27/2012 0659   PROTEINUR Negative 10/10/2012 0320   PROTEINUR NEGATIVE 08/27/2012 0659   UROBILINOGEN 0.2 08/27/2012 0659   NITRITE Negative 10/10/2012 0320   NITRITE NEGATIVE 08/27/2012 0659   LEUKOCYTESUR Negative 10/10/2012 0320   LEUKOCYTESUR NEGATIVE 08/27/2012 0659     ) Recent Results (from the past 240 hour(s))  Culture, blood (routine x 2)     Status: None (Preliminary result)   Collection Time: 10/20/15  4:21 AM  Result Value Ref Range Status   Specimen Description BLOOD RIGHT ARM  Final   Special Requests BOTTLES DRAWN AEROBIC AND ANAEROBIC  Final   Culture NO GROWTH < 12 HOURS  Final   Report Status PENDING  Incomplete  Culture, blood (routine x 2)     Status: None  (Preliminary result)   Collection Time: 10/20/15  4:40 AM  Result Value Ref Range Status   Specimen Description BLOOD LEFT ARM  Final   Special Requests BOTTLES DRAWN AEROBIC AND ANAEROBIC  Final   Culture NO GROWTH < 12 HOURS  Final   Report Status PENDING  Incomplete  Culture, sputum-assessment     Status: None   Collection Time: 10/21/15  6:47 AM  Result Value Ref Range Status   Specimen Description EXPECTORATED SPUTUM  Final   Special Requests NONE  Final   Sputum evaluation   Final    THIS SPECIMEN IS ACCEPTABLE. RESPIRATORY CULTURE REPORT TO FOLLOW.   Report Status 10/21/2015 FINAL  Final  Culture, respiratory (NON-Expectorated)     Status: None (Preliminary result)  Collection Time: 10/21/15  6:47 AM  Result Value Ref Range Status   Specimen Description EXPECTORATED SPUTUM  Final   Special Requests NONE  Final   Gram Stain   Final    ABUNDANT WBC PRESENT, PREDOMINANTLY PMN RARE SQUAMOUS EPITHELIAL CELLS PRESENT RARE GRAM POSITIVE COCCOBACILLUS    Culture PENDING  Incomplete   Report Status PENDING  Incomplete      Anti-infectives    Start     Dose/Rate Route Frequency Ordered Stop   10/21/15 1000  amoxicillin-clavulanate (AUGMENTIN) 875-125 MG per tablet 1 tablet     1 tablet Oral Every 12 hours 10/21/15 0745     10/20/15 1800  vancomycin (VANCOCIN) IVPB 1000 mg/200 mL premix  Status:  Discontinued     1,000 mg 200 mL/hr over 60 Minutes Intravenous Every 12 hours 10/20/15 0624 10/21/15 0819   10/20/15 1400  ceFEPIme (MAXIPIME) 1 g in dextrose 5 % 50 mL IVPB  Status:  Discontinued     1 g 100 mL/hr over 30 Minutes Intravenous Every 8 hours 10/20/15 0624 10/21/15 0745   10/20/15 0730  ceFEPIme (MAXIPIME) 1 g in dextrose 5 % 50 mL IVPB  Status:  Discontinued     1 g 100 mL/hr over 30 Minutes Intravenous Every 8 hours 10/20/15 0727 10/20/15 0741   10/20/15 0415  vancomycin (VANCOCIN) 1,500 mg in sodium chloride 0.9 % 500 mL IVPB     1,500 mg 250 mL/hr over 120  Minutes Intravenous  Once 10/20/15 0400 10/20/15 0656   10/20/15 0400  ceFEPIme (MAXIPIME) 2 g in dextrose 5 % 50 mL IVPB     2 g 100 mL/hr over 30 Minutes Intravenous  Once 10/20/15 0359 10/20/15 0501   10/20/15 0400  vancomycin (VANCOCIN) IVPB 1000 mg/200 mL premix  Status:  Discontinued     1,000 mg 200 mL/hr over 60 Minutes Intravenous  Once 10/20/15 0359 10/20/15 0400       Radiology Studies: Dg Chest Portable 1 View  10/20/2015  CLINICAL DATA:  Left-sided chest pain and shortness of breath. Admission 2 weeks ago for pneumonia. EXAM: PORTABLE CHEST 1 VIEW COMPARISON:  CT chest 10/02/2015.  Chest 09/30/2015. FINDINGS: Persistent linear infiltration in the left lung base. Developing infiltration now in the right lung base. Changes suggest atelectasis or multifocal pneumonia. No blunting of costophrenic angles. No pneumothorax. Mediastinal contours appear intact. Normal heart size and pulmonary vascularity. Calcification of the aorta. IMPRESSION: Persistent linear infiltration in the left lung base with new infiltration in the right lung base. Electronically Signed   By: Burman Nieves M.D.   On: 10/20/2015 04:08        Scheduled Meds: . amoxicillin-clavulanate  1 tablet Oral Q12H  . aspirin EC  81 mg Oral Daily  . folic acid  1 mg Oral Daily  . guaiFENesin  600 mg Oral BID  . hydrALAZINE  25 mg Oral Q8H  . labetalol  100 mg Oral TID  . LORazepam  0-4 mg Oral Q6H   Followed by  . [START ON 10/22/2015] LORazepam  0-4 mg Oral Q12H  . multivitamin with minerals  1 tablet Oral Daily  . thiamine  100 mg Oral Daily   Or  . thiamine  100 mg Intravenous Daily   Continuous Infusions:       Time spent: 25 min    Krislyn Donnan Juanetta Gosling, DO [facility] Pager [phone]  If 7PM-7AM, please contact night-coverage [email or web address] Password [email or web address] 10/21/2015, 2:13 PM
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
## 2015-10-22 DIAGNOSIS — J439 Emphysema, unspecified: Secondary | ICD-10-CM

## 2015-10-22 MED ORDER — LABETALOL HCL 100 MG PO TABS
100.0000 mg | ORAL_TABLET | Freq: Two times a day (BID) | ORAL | Status: DC
  Administered 2015-10-22: 100 mg via ORAL
  Filled 2015-10-22: qty 1

## 2015-10-22 MED ORDER — AMOXICILLIN-POT CLAVULANATE 875-125 MG PO TABS: 1.0000 | ORAL_TABLET | Freq: Two times a day (BID) | ORAL | Status: DC

## 2015-10-22 MED ORDER — LABETALOL HCL 100 MG PO TABS: 100.0000 mg | ORAL_TABLET | Freq: Two times a day (BID) | ORAL | Status: DC

## 2015-10-22 MED ORDER — LISINOPRIL 5 MG PO TABS
5.0000 mg | ORAL_TABLET | Freq: Every day | ORAL | Status: DC
  Administered 2015-10-22: 5 mg via ORAL
  Filled 2015-10-22: qty 1

## 2015-10-22 MED ORDER — LISINOPRIL 5 MG PO TABS: 5.0000 mg | ORAL_TABLET | Freq: Every day | ORAL | Status: DC

## 2015-10-22 MED ORDER — ASPIRIN 81 MG PO TBEC: 81.0000 mg | DELAYED_RELEASE_TABLET | Freq: Every day | ORAL | Status: DC

## 2015-10-22 MED FILL — AMOX-CLAV 875-125 MG TABLET: 875-125 | 3 days supply | Qty: 6 | Fill #0

## 2015-10-22 MED FILL — LABETALOL HCL 100 MG TABLET: 100 | 30 days supply | Qty: 60 | Fill #0

## 2015-10-22 MED FILL — LISINOPRIL 5 MG TABLET: 5 | 34 days supply | Qty: 34 | Fill #0

## 2015-10-22 NOTE — Care Management Note (Signed)
Case Management Note  Patient Details  Name: DARBY KAUP MRN: [id number] Date of Birth: [birth year]  Subjective/Objective:                    Action/Plan: Pt discharging today. Pt is homeless and did not want any information on the shelters in the area. Pt has been set up with [facility] in the past and CM encouraged him to use them for his meds and PCP needs. CM provided the [facility] letter for the patient and medications filled at Outpatient pharmacy with delivery of the patients medications to him in his room. Pt wanting to walk to his homeless site at discharge and not wanting transportation. Bedside RN updated.   Expected Discharge Date:                  Expected Discharge Plan:  Home/Self Care  In-House Referral:     Discharge planning Services  CM Consult  Post Acute Care Choice:    Choice offered to:     DME Arranged:    DME Agency:     HH Arranged:    HH Agency:     Status of Service:  Completed, signed off  If discussed at [employer] of Stay Meetings, dates discussed:    Additional Comments:  Kermit Balo, RN 10/22/2015, 1:38 PM

## 2015-10-22 NOTE — Discharge Summary (Signed)
Physician Discharge Summary  Brian [name]:[id number] DOB: 05/06/1955 DOA: 10/20/2015  PCP: [name] Patient  Admit date: 10/20/2015 Discharge date: 10/22/2015   Recommendations for Outpatient Follow-Up:   1. Echo 3 months- repeat after alcohol and cocaine cessation 2. Repeat chest x ray 6 weeks   Discharge Diagnosis:   Principal Problem:   HCAP (healthcare-associated pneumonia) Active Problems:   COPD (chronic obstructive pulmonary disease) (HCC)   Polysubstance abuse   Pneumonia   Discharge disposition:  Patient chose to go to homeless camp  Discharge Condition: Improved.  Diet recommendation: Low sodium, heart healthy.   Wound care: None.   History of Present Illness:   Brian [name] is a 60 y.o. male with COPD and polysubstance abuse including cocaine and alcohol who was recently admitted 2 weeks ago for COPD exacerbation presents to the ER because of left-sided chest pain ongoing for last 4-5 days which is pleuritic in nature with productive cough. Patient has been having some discolored phlegm. Denies any fever or chills. In the ER chest x-ray shows infiltrates concerning for pneumonia. EKG shows nonspecific findings. Patient is mildly hypotensive initially and fluid bolus was again following which patient's blood pressure improved. Patient states he last took cocaine 10 days ago. Drinks alcohol everyday.    Hospital Course by Problem:   Healthcare associated pneumonia - with patient recently admitted 2 weeks ago and chest x-ray showing new infiltrates and patient having pleuritic-type of chest pain patient has been placed on empiric antibiotics for healthcare associated pneumonia. -? If residual-- 6 week follow up x ray for resolution -PO augmentin  EF 20-25 % -suspect from cocaine/alcohol -added ACE -BB when patient stops cocaine -discussed with cards on call  Pleuritic-type of chest pain -  d-dimer negative Aspirin.  Alcohol abuse - CIWA  protocol.  Cocaine abuse - Patient advised to quit these habits.  COPD - presently not wheezing. Nebs mucinex  Homeless - social work consult    Medical Consultants:    None.   Discharge Exam:   Filed Vitals:   10/22/15 0459 10/22/15 1024  BP: 132/85 143/84  Pulse: 76 63  Temp: 98.6 F (37 C) 98.8 F (37.1 C)  Resp: 18 20   Filed Vitals:   10/21/15 2130 10/22/15 0202 10/22/15 0459 10/22/15 1024  BP: 148/79 139/82 132/85 143/84  Pulse: 82 84 76 63  Temp: 98.2 F (36.8 C) 98.6 F (37 C) 98.6 F (37 C) 98.8 F (37.1 C)  TempSrc: Oral Oral Oral Oral  Resp: Height:      Weight:      SpO2: 94% 94% 95% 94%    Gen:  NAD- breathing much better    The results of significant diagnostics from this hospitalization (including imaging, microbiology, ancillary and laboratory) are listed below for reference.     Procedures and Diagnostic Studies:   Dg Chest Portable 1 View  10/20/2015  CLINICAL DATA:  Left-sided chest pain and shortness of breath. Admission 2 weeks ago for pneumonia. EXAM: PORTABLE CHEST 1 VIEW COMPARISON:  CT chest 10/02/2015.  Chest 09/30/2015. FINDINGS: Persistent linear infiltration in the left lung base. Developing infiltration now in the right lung base. Changes suggest atelectasis or multifocal pneumonia. No blunting of costophrenic angles. No pneumothorax. Mediastinal contours appear intact. Normal heart size and pulmonary vascularity. Calcification of the aorta. IMPRESSION: Persistent linear infiltration in the left lung base with new infiltration in the right lung base. Electronically Signed   By:  Burman Nieves M.D.   On: 10/20/2015 04:08     Labs:   Basic Metabolic Panel:  Recent Labs Lab 10/20/15 0329 10/20/15 1126 10/21/15 0823  NA 138  --  138  K 3.2*  --  4.0  CL 104  --  102  CO2 24  --  28  GLUCOSE 140*  --  162*  BUN 13  --  8  CREATININE 1.08 0.82 0.71  CALCIUM 9.0  --  8.7*   GFR Estimated Creatinine  Clearance: 107.5 mL/min (by C-G formula based on Cr of 0.71). Liver Function Tests: No results for input(s): AST, ALT, ALKPHOS, BILITOT, PROT, ALBUMIN in the last 168 hours. No results for input(s): LIPASE, AMYLASE in the last 168 hours. No results for input(s): AMMONIA in the last 168 hours. Coagulation profile No results for input(s): INR, PROTIME in the last 168 hours.  CBC:  Recent Labs Lab 10/20/15 0329 10/20/15 0421 10/20/15 1126 10/21/15 0823  WBC 7.6  --  7.4 9.9  NEUTROABS  --  3.8  --   --   HGB 15.6  --  15.0 15.7  HCT 47.8  --  47.2 49.0  MCV 97.0  --  97.5 98.0  PLT 197  --  178 166   Cardiac Enzymes:  Recent Labs Lab 10/20/15 1126 10/20/15 1407 10/20/15 1959  TROPONINI <0.03 <0.03 <0.03   BNP: Invalid input(s): POCBNP CBG: No results for input(s): GLUCAP in the last 168 hours. D-Dimer  Recent Labs  10/20/15 0839  DDIMER 0.30   Hgb A1c No results for input(s): HGBA1C in the last 72 hours. Lipid Profile No results for input(s): CHOL, HDL, LDLCALC, TRIG, CHOLHDL, LDLDIRECT in the last 72 hours. Thyroid function studies No results for input(s): TSH, T4TOTAL, T3FREE, THYROIDAB in the last 72 hours.  Invalid input(s): FREET3 Anemia work up No results for input(s): VITAMINB12, FOLATE, FERRITIN, TIBC, IRON, RETICCTPCT in the last 72 hours. Microbiology Recent Results (from the past 240 hour(s))  Culture, blood (routine x 2)     Status: None (Preliminary result)   Collection Time: 10/20/15  4:21 AM  Result Value Ref Range Status   Specimen Description BLOOD RIGHT ARM  Final   Special Requests BOTTLES DRAWN AEROBIC AND ANAEROBIC  Final   Culture NO GROWTH 2 DAYS  Final   Report Status PENDING  Incomplete  Culture, blood (routine x 2)     Status: None (Preliminary result)   Collection Time: 10/20/15  4:40 AM  Result Value Ref Range Status   Specimen Description BLOOD LEFT ARM  Final   Special Requests BOTTLES DRAWN AEROBIC AND ANAEROBIC   Final   Culture NO GROWTH 2 DAYS  Final   Report Status PENDING  Incomplete  Culture, sputum-assessment     Status: None   Collection Time: 10/21/15  6:47 AM  Result Value Ref Range Status   Specimen Description EXPECTORATED SPUTUM  Final   Special Requests NONE  Final   Sputum evaluation   Final    THIS SPECIMEN IS ACCEPTABLE. RESPIRATORY CULTURE REPORT TO FOLLOW.   Report Status 10/21/2015 FINAL  Final  Culture, respiratory (NON-Expectorated)     Status: None (Preliminary result)   Collection Time: 10/21/15  6:47 AM  Result Value Ref Range Status   Specimen Description EXPECTORATED SPUTUM  Final   Special Requests NONE  Final   Gram Stain   Final    ABUNDANT WBC PRESENT, PREDOMINANTLY PMN RARE SQUAMOUS EPITHELIAL CELLS PRESENT RARE Romie Minus  POSITIVE COCCOBACILLUS    Culture CULTURE REINCUBATED FOR BETTER GROWTH  Final   Report Status PENDING  Incomplete     Discharge Instructions:   Discharge Instructions    Diet - low sodium heart healthy    Complete by:  As directed      Discharge instructions    Complete by:  As directed   Stop alcohol/cocaine as it is making your heart weak Outpatient chest x ray in 6 weeks Repeat echo in 3 months     Increase activity slowly    Complete by:  As directed             Medication List    STOP taking these medications        folic acid 1 MG tablet  Commonly known as:  FOLVITE     hydrALAZINE 25 MG tablet  Commonly known as:  APRESOLINE     ipratropium-albuterol 0.5-2.5 (3) MG/3ML Soln  Commonly known as:  DUONEB     oxyCODONE-acetaminophen 5-325 MG tablet  Commonly known as:  PERCOCET/ROXICET     predniSONE 10 MG tablet  Commonly known as:  DELTASONE     thiamine 100 MG tablet      TAKE these medications        amoxicillin-clavulanate 875-125 MG tablet  Commonly known as:  AUGMENTIN  Take 1 tablet by mouth every 12 (twelve) hours.     aspirin 81 MG EC tablet  Take 1 tablet (81 mg total) by mouth daily.      labetalol 100 MG tablet  Commonly known as:  NORMODYNE  Take 1 tablet (100 mg total) by mouth 2 (two) times daily.     lisinopril 5 MG tablet  Commonly known as:  PRINIVIL,ZESTRIL  Take 1 tablet (5 mg total) by mouth daily.     multivitamin with minerals Tabs tablet  Take 1 tablet by mouth daily.          Time coordinating discharge: 35 min  Signed:  Vishruth Seoane U Sharetha Newson   [facility] 10/22/2015, 5:07 PM

## 2015-10-22 NOTE — Progress Notes (Signed)
Discharge orders received, Pt for discharge today. IV d/c'd. D/c instructions and medications given with verbalized understanding. Patient walked down to ED exit by staff.

## 2015-10-23 LAB — CULTURE, RESPIRATORY W GRAM STAIN: Culture: NORMAL

## 2015-10-23 LAB — CULTURE, RESPIRATORY

## 2015-10-25 LAB — CULTURE, BLOOD (ROUTINE X 2)
Culture: NO GROWTH
Culture: NO GROWTH

## 2016-04-09 ENCOUNTER — Emergency Department (HOSPITAL_COMMUNITY): Payer: Self-pay

## 2016-04-09 ENCOUNTER — Emergency Department (HOSPITAL_COMMUNITY)
Admission: EM | Admit: 2016-04-09 | Discharge: 2016-04-10 | Disposition: A | Discharge status: Home / Self Care | Payer: Self-pay | Attending: Emergency Medicine | Admitting: Emergency Medicine

## 2016-04-09 ENCOUNTER — Encounter (HOSPITAL_COMMUNITY): Payer: Self-pay | Admitting: *Deleted

## 2016-04-09 DIAGNOSIS — Y999 Unspecified external cause status: Secondary | ICD-10-CM | POA: Insufficient documentation

## 2016-04-09 DIAGNOSIS — Y939 Activity, unspecified: Secondary | ICD-10-CM | POA: Insufficient documentation

## 2016-04-09 DIAGNOSIS — I509 Heart failure, unspecified: Secondary | ICD-10-CM | POA: Insufficient documentation

## 2016-04-09 DIAGNOSIS — Y92481 Parking lot as the place of occurrence of the external cause: Secondary | ICD-10-CM | POA: Insufficient documentation

## 2016-04-09 DIAGNOSIS — R930 Abnormal findings on diagnostic imaging of skull and head, not elsewhere classified: Secondary | ICD-10-CM | POA: Insufficient documentation

## 2016-04-09 DIAGNOSIS — F10120 Alcohol abuse with intoxication, uncomplicated: Secondary | ICD-10-CM | POA: Insufficient documentation

## 2016-04-09 DIAGNOSIS — F17210 Nicotine dependence, cigarettes, uncomplicated: Secondary | ICD-10-CM | POA: Insufficient documentation

## 2016-04-09 DIAGNOSIS — Z79899 Other long term (current) drug therapy: Secondary | ICD-10-CM | POA: Insufficient documentation

## 2016-04-09 DIAGNOSIS — W228XXA Striking against or struck by other objects, initial encounter: Secondary | ICD-10-CM | POA: Insufficient documentation

## 2016-04-09 DIAGNOSIS — Z7982 Long term (current) use of aspirin: Secondary | ICD-10-CM | POA: Insufficient documentation

## 2016-04-09 DIAGNOSIS — J449 Chronic obstructive pulmonary disease, unspecified: Secondary | ICD-10-CM | POA: Insufficient documentation

## 2016-04-09 DIAGNOSIS — R05 Cough: Secondary | ICD-10-CM | POA: Insufficient documentation

## 2016-04-09 DIAGNOSIS — R059 Cough, unspecified: Secondary | ICD-10-CM

## 2016-04-09 DIAGNOSIS — F10920 Alcohol use, unspecified with intoxication, uncomplicated: Secondary | ICD-10-CM

## 2016-04-09 DIAGNOSIS — S0993XA Unspecified injury of face, initial encounter: Secondary | ICD-10-CM | POA: Insufficient documentation

## 2016-04-09 NOTE — ED Notes (Signed)
Pt is still resting, sleeping.  Food placed at his bedside (sprite, sandwich and pudding) for when he wakes up.  His CBG was 95.  Warm blankets in place.  I was unable to help pt get cleaned up as he became aggressive with me when I tried.  Presently allowing pt to sleep
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
## 2016-04-09 NOTE — ED Notes (Signed)
CBG 95 
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
## 2016-04-09 NOTE — ED Notes (Signed)
Patient transported to CT 

## 2016-04-09 NOTE — ED Notes (Signed)
Pt refused CBG-became combative and beligerent
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
## 2016-04-09 NOTE — ED Notes (Signed)
Pt refused CBG.

## 2016-04-10 ENCOUNTER — Emergency Department (HOSPITAL_COMMUNITY): Payer: Self-pay

## 2016-04-10 LAB — CBG MONITORING, ED: Glucose-Capillary: 95 mg/dL (ref 65–99)

## 2016-04-10 MED ORDER — AZITHROMYCIN 250 MG PO TABS: 250.0000 mg | ORAL_TABLET | Freq: Every day | ORAL | 0 refills | Status: DC

## 2016-04-10 MED ORDER — AZITHROMYCIN 250 MG PO TABS
500.0000 mg | ORAL_TABLET | Freq: Once | ORAL | Status: AC
  Administered 2016-04-10: 500 mg via ORAL
  Filled 2016-04-10: qty 2

## 2016-04-10 NOTE — Discharge Instructions (Signed)
Please read and follow all provided instructions.  Your diagnoses today include:  1. Alcoholic intoxication without complication (HCC)   2. Facial injury, initial encounter   3. Cough     Tests performed today include:  CT scan of your head, face, and neck that did not show any serious injury.  Chest x-ray - possible mild infection, no severe pneumonia  Vital signs. See below for your results today.   Medications prescribed:   Azithromycin - antibiotic for respiratory infection  You have been prescribed an antibiotic medicine: take the entire course of medicine even if you are feeling better. Stopping early can cause the antibiotic not to work.  Take any prescribed medications only as directed.  Home care instructions:  Follow any educational materials contained in this packet.  BE VERY CAREFUL not to take multiple medicines containing Tylenol (also called acetaminophen). Doing so can lead to an overdose which can damage your liver and cause liver failure and possibly death.   Follow-up instructions: Please follow-up with your primary care provider in the next 3 days for further evaluation of your symptoms.   Return instructions:  SEEK IMMEDIATE MEDICAL ATTENTION IF:  There is confusion or drowsiness (although children frequently become drowsy after injury).   You cannot awaken the injured person.   You have more than one episode of vomiting.   You notice dizziness or unsteadiness which is getting worse, or inability to walk.   You have convulsions or unconsciousness.   You experience severe, persistent headaches not relieved by Tylenol.  You cannot use arms or legs normally.   There are changes in pupil sizes. (This is the black center in the colored part of the eye)   There is clear or bloody discharge from the nose or ears.   You have change in speech, vision, swallowing, or understanding.   Localized weakness, numbness, tingling, or change in bowel or  bladder control.  You have any other emergent concerns.  Additional Information: You have had a head injury which does not appear to require admission at this time.  Your vital signs today were: BP 134/97 (BP Location: Left Arm)    Pulse 101    Temp 98.1 F (36.7 C) (Oral)    Resp 18    SpO2 95%  If your blood pressure (BP) was elevated above 135/85 this visit, please have this repeated by your doctor within one month. --------------

## 2016-09-04 ENCOUNTER — Encounter (HOSPITAL_COMMUNITY): Payer: Self-pay

## 2016-09-04 ENCOUNTER — Emergency Department (HOSPITAL_COMMUNITY)
Admission: EM | Admit: 2016-09-04 | Discharge: 2016-09-05 | Disposition: A | Discharge status: Home / Self Care | Payer: Self-pay | Attending: Emergency Medicine | Admitting: Emergency Medicine

## 2016-09-04 ENCOUNTER — Emergency Department (HOSPITAL_COMMUNITY): Payer: Self-pay

## 2016-09-04 DIAGNOSIS — R0602 Shortness of breath: Secondary | ICD-10-CM | POA: Insufficient documentation

## 2016-09-04 DIAGNOSIS — J441 Chronic obstructive pulmonary disease with (acute) exacerbation: Secondary | ICD-10-CM | POA: Insufficient documentation

## 2016-09-04 DIAGNOSIS — F1410 Cocaine abuse, uncomplicated: Secondary | ICD-10-CM | POA: Insufficient documentation

## 2016-09-04 DIAGNOSIS — F10920 Alcohol use, unspecified with intoxication, uncomplicated: Secondary | ICD-10-CM

## 2016-09-04 DIAGNOSIS — Z79899 Other long term (current) drug therapy: Secondary | ICD-10-CM | POA: Insufficient documentation

## 2016-09-04 DIAGNOSIS — R079 Chest pain, unspecified: Secondary | ICD-10-CM

## 2016-09-04 DIAGNOSIS — F10120 Alcohol abuse with intoxication, uncomplicated: Secondary | ICD-10-CM | POA: Insufficient documentation

## 2016-09-04 DIAGNOSIS — F17210 Nicotine dependence, cigarettes, uncomplicated: Secondary | ICD-10-CM | POA: Insufficient documentation

## 2016-09-04 MED ORDER — IPRATROPIUM-ALBUTEROL 0.5-2.5 (3) MG/3ML IN SOLN
3.0000 mL | Freq: Once | RESPIRATORY_TRACT | Status: AC
  Administered 2016-09-05: 3 mL via RESPIRATORY_TRACT
  Filled 2016-09-04: qty 3

## 2016-09-04 NOTE — ED Provider Notes (Signed)
[facility]-EMERGENCY DEPT Provider Note   CSN: [id number] Arrival date & time: 09/04/16  2312   By signing my name below, I, Clarisse Gouge, attest that this documentation has been prepared under the direction and in the presence of Dione Booze, MD. Electronically signed, Clarisse Gouge, ED Scribe. 09/05/16. 12:00 AM.  History   Chief Complaint Chief Complaint  Patient presents with  . Chest Pain   The history is provided by the patient and medical records. No language interpreter was used.    Brian [name] is a 61 y.o. male with h/o polysubstance abuse, COPD, heart failure, CHF and emphysema who presents to the Emergency Department with concern for generalized chest pain gradually worsening over the course of 1 month. Associated chest tightness, SOB, cough, chest congestion, L arm and finger numbness x 3 weeks noted. He describes 9/10 pain "like someone is grabbing him". No modifying factors noted. Pt allegedly R handed. He reports he is still homeless. He states he smokes ~1/2 pack of cigarettes daily. He adds he drinks ~2-3 beers daily and he drank less than his usual amount today. Pt denies recent cocaine use. No other complaints at this time.   Past Medical History:  Diagnosis Date  . Alcohol abuse    Heavy alcohol abuse and homelessness  . Arthritis   . CHF (congestive heart failure) (HCC)   . Cocaine abuse    And crack   . COPD (chronic obstructive pulmonary disease) (HCC)   . Heart failure   . Homelessness   . Suicide attempt (HCC)   . Tobacco abuse     Patient Active Problem List   Diagnosis Date Noted  . HCAP (healthcare-associated pneumonia) 10/20/2015  . Polysubstance abuse 10/20/2015  . Pneumonia 10/20/2015  . CAP (community acquired pneumonia) 09/30/2015  . Acute respiratory failure with hypoxia and hypercarbia (HCC) 09/30/2015  . COPD with acute exacerbation (HCC) 09/30/2015  . Alcohol withdrawal (HCC) 02/04/2013  . Substance induced mood disorder (HCC)  02/04/2013  . Polysubstance (excluding opioids) dependence, daily use (HCC) 02/03/2013  . Alcohol intoxication (HCC) 02/03/2013  . Alcohol dependence (HCC) 02/03/2013  . Gout attack 11/12/2012  . Closed fracture of 5th metacarpal 11/12/2012  . Cellulitis of right hand 11/10/2012  . Chest pain at rest 08/27/2012  . Ventricular tachycardia (HCC) 04/20/2011  . Alcohol abuse   . COPD (chronic obstructive pulmonary disease) (HCC)   . Homelessness   . Cocaine abuse     Past Surgical History:  Procedure Laterality Date  . INNER EAR SURGERY         Home Medications    Prior to Admission medications   Medication Sig Start Date End Date Taking? Authorizing Provider  azithromycin (ZITHROMAX) 250 MG tablet Take 1 tablet (250 mg total) by mouth daily. 04/10/16   Renne Crigler, PA-C  labetalol (NORMODYNE) 100 MG tablet Take 1 tablet (100 mg total) by mouth 2 (two) times daily. 10/22/15   Joseph Art, DO  lisinopril (PRINIVIL,ZESTRIL) 5 MG tablet Take 1 tablet (5 mg total) by mouth daily. 10/22/15   Joseph Art, DO    Family History Family History  Problem Relation Age of Onset  . Coronary artery disease Unknown   . Diabetes Unknown   . Cancer Sister     Social History Social History  Substance Use Topics  . Smoking status: Current Every Day Smoker    Packs/day: 2.00    Types: Cigarettes  . Smokeless tobacco: Never Used  . Alcohol use Yes  Comment: Heavy. 5-10 40oz daily     Allergies   Patient has no known allergies.   Review of Systems Review of Systems  Respiratory: Positive for cough, chest tightness and shortness of breath.   Cardiovascular: Positive for chest pain.  Neurological: Positive for numbness.  All other systems reviewed and are negative.    Physical Exam Updated Vital Signs BP 118/74   Pulse (!) 101   Temp 98.7 F (37.1 C) (Oral)   SpO2 95%   Physical Exam  Constitutional: He is oriented to person, place, and time. He appears  well-developed and well-nourished.  HENT:  Head: Normocephalic and atraumatic.  Eyes: EOM are normal. Pupils are equal, round, and reactive to light.  Neck: Normal range of motion. Neck supple. No JVD present.  Cardiovascular: Normal rate, regular rhythm and normal heart sounds.   No murmur heard. Pulmonary/Chest: Effort normal. He has decreased breath sounds (diffusely). He has rhonchi (diffusely coarse and expiratory). He has no rales. He exhibits no tenderness.  Abdominal: Soft. Bowel sounds are normal. He exhibits no distension and no mass. There is no tenderness.  Musculoskeletal: Normal range of motion. He exhibits no edema.  Lymphadenopathy:    He has no cervical adenopathy.  Neurological: He is alert and oriented to person, place, and time. A sensory deficit is present. No cranial nerve deficit. He exhibits normal muscle tone. Coordination normal.  Decreased pinprick sensation left hand and distal forearm - not in dermatome or peripheral nerve pattern.  Skin: Skin is warm and dry. No rash noted.  Psychiatric: He has a normal mood and affect. His behavior is normal. Judgment and thought content normal.  Nursing note and vitals reviewed.    ED Treatments / Results  DIAGNOSTIC STUDIES: Oxygen Saturation is 95% on RA, low by my interpretation.    COORDINATION OF CARE: 11:54 PM-Discussed next steps with pt. Pt verbalized understanding and is agreeable with the plan. Will order medication.   Labs (all labs ordered are listed, but only abnormal results are displayed) Labs Reviewed  HEPATIC FUNCTION PANEL - Abnormal; Notable for the following:       Result Value   Total Protein 6.4 (*)    Indirect Bilirubin 0.1 (*)    All other components within normal limits  ETHANOL - Abnormal; Notable for the following:    Alcohol, Ethyl (B) 196 (*)    All other components within normal limits  RAPID URINE DRUG SCREEN, HOSP PERFORMED - Abnormal; Notable for the following:    Cocaine POSITIVE  (*)    All other components within normal limits  BASIC METABOLIC PANEL  CBC  MAGNESIUM  BRAIN NATRIURETIC PEPTIDE  DIFFERENTIAL  I-STAT TROPOININ, ED  I-STAT TROPOININ, ED    EKG  EKG Interpretation  Date/Time:  Monday Sep 04 2016 23:24:50 EDT Ventricular Rate:  94 PR Interval:    QRS Duration: 81 QT Interval:  350 QTC Calculation: 438 R Axis:   71 Text Interpretation:  Sinus rhythm Anteroseptal infarct, old When compared with ECG of 10/20/2015, Nonspecific ST and T wave abnormality has improved Confirmed by Dione Booze ([id number]) on 09/04/2016 11:36:56 PM       Radiology Dg Chest 2 View  Result Date: 09/04/2016 CLINICAL DATA:  61 year old male with shortness of breath and cough. EXAM: CHEST  2 VIEW COMPARISON:  Chest radiograph dated 04/10/2016 FINDINGS: Shallow inspiration with minimal bibasilar atelectatic changes. There is no focal consolidation, pleural effusion, or pneumothorax. Stable top-normal cardiac size. No acute osseous pathology. IMPRESSION:  No active cardiopulmonary disease. Electronically Signed   By: Elgie Collard M.D.   On: 09/04/2016 23:56    Procedures Procedures (including critical care time)  Medications Ordered in ED Medications  ipratropium-albuterol (DUONEB) 0.5-2.5 (3) MG/3ML nebulizer solution 3 mL (3 mLs Nebulization Given 09/05/16 0011)     Initial Impression / Assessment and Plan / ED Course  I have reviewed the triage vital signs and the nursing notes.  Pertinent labs & imaging results that were available during my care of the patient were reviewed by me and considered in my medical decision making (see chart for details).  Cough of and dyspnea secondary to COPD of-worsened with tobacco abuse. Chest pain which appears nonspecific. Old records are reviewed, and he has history of cocaine abuse. He currently denies cocaine use, but drug screen is positive for cocaine. He initially tried to state that was because he was around other people  using, but eventually admitted that he was using cocaine. Importance of abstaining from cocaine was explained to the patient. He was kept in the ED for 2 troponins which were both undetectable. ECG is unremarkable. He was also intoxicated with ethanol level of 192. He states he does not have any money, so any prescriptions would be worthless. He did have improvement in his breathing and coughing with 2 albuterol with ipratropium nebulizer treatments. He is given a dose of methylprednisolone initially. Since it was unlikely that he would get a prescription for prednisone filled, he is given a dose of dexamethasone. He is sent home with an albuterol inhaler.  Final Clinical Impressions(s) / ED Diagnoses   Final diagnoses:  COPD exacerbation (HCC)  Chest pain, unspecified type  Cocaine abuse  Alcohol intoxication, uncomplicated (HCC)    New Prescriptions New Prescriptions   No medications on file   I personally performed the services described in this documentation, which was scribed in my presence. The recorded information has been reviewed and is accurate.       Dione Booze, MD 09/05/16 [phone]

## 2016-09-04 NOTE — ED Triage Notes (Signed)
Pt here for chest pain by ems onset at 2200 while holding his sign. Reports left arm numbness and dizziness also. Pt alert and oriented. Given ntg by ems x 1 dropped pressure from 148/90 to 98/52 with no change in pain. Pt is non compliant with medications.

## 2016-09-04 NOTE — ED Notes (Signed)
Patient transported to X-ray 

## 2016-09-05 LAB — BASIC METABOLIC PANEL
Anion gap: 11 (ref 5–15)
BUN: 11 mg/dL (ref 6–20)
CALCIUM: 9.2 mg/dL (ref 8.9–10.3)
CHLORIDE: 105 mmol/L (ref 101–111)
CO2: 24 mmol/L (ref 22–32)
Creatinine, Ser: 0.92 mg/dL (ref 0.61–1.24)
GFR calc non Af Amer: >60 mL/min (ref >60)
Glucose, Bld: 96 mg/dL (ref 65–99)
Potassium: 4 mmol/L (ref 3.5–5.1)
Sodium: 140 mmol/L (ref 135–145)

## 2016-09-05 LAB — DIFFERENTIAL
Basophils Absolute: 0 10*3/uL (ref 0.0–0.1)
Basophils Relative: 1 %
EOS ABS: 0.1 10*3/uL (ref 0.0–0.7)
EOS PCT: 1 %
LYMPHS PCT: 39 %
Lymphs Abs: 2.4 10*3/uL (ref 0.7–4.0)
MONO ABS: 0.4 10*3/uL (ref 0.1–1.0)
Monocytes Relative: 7 %
NEUTROS PCT: 52 %
Neutro Abs: 3.3 10*3/uL (ref 1.7–7.7)

## 2016-09-05 LAB — CBC
HCT: 49.2 % (ref 39.0–52.0)
HEMOGLOBIN: 16.2 g/dL (ref 13.0–17.0)
MCH: 31.8 pg (ref 26.0–34.0)
MCHC: 32.9 g/dL (ref 30.0–36.0)
MCV: 96.7 fL (ref 78.0–100.0)
Platelets: 171 10*3/uL (ref 150–400)
RBC: 5.09 MIL/uL (ref 4.22–5.81)
RDW: 14.8 % (ref 11.5–15.5)
WBC: 6.2 10*3/uL (ref 4.0–10.5)

## 2016-09-05 LAB — I-STAT TROPONIN, ED
TROPONIN I, POC: 0 ng/mL (ref 0.00–0.08)
TROPONIN I, POC: 0 ng/mL (ref 0.00–0.08)

## 2016-09-05 LAB — HEPATIC FUNCTION PANEL
ALBUMIN: 3.6 g/dL (ref 3.5–5.0)
ALT: 42 U/L (ref 17–63)
AST: 40 U/L (ref 15–41)
Alkaline Phosphatase: 38 U/L (ref 38–126)
BILIRUBIN TOTAL: 0.3 mg/dL (ref 0.3–1.2)
Bilirubin, Direct: 0.2 mg/dL (ref 0.1–0.5)
Indirect Bilirubin: 0.1 mg/dL — ABNORMAL LOW (ref 0.3–0.9)
Total Protein: 6.4 g/dL — ABNORMAL LOW (ref 6.5–8.1)

## 2016-09-05 LAB — RAPID URINE DRUG SCREEN, HOSP PERFORMED
AMPHETAMINES: NOT DETECTED
BARBITURATES: NOT DETECTED
Benzodiazepines: NOT DETECTED
Cocaine: POSITIVE — AB
Opiates: NOT DETECTED
TETRAHYDROCANNABINOL: NOT DETECTED

## 2016-09-05 LAB — MAGNESIUM: MAGNESIUM: 1.9 mg/dL (ref 1.7–2.4)

## 2016-09-05 LAB — ETHANOL: Alcohol, Ethyl (B): 196 mg/dL — ABNORMAL HIGH (ref <5)

## 2016-09-05 LAB — BRAIN NATRIURETIC PEPTIDE: B NATRIURETIC PEPTIDE 5: 95.8 pg/mL (ref 0.0–100.0)

## 2016-09-05 MED ORDER — ALBUTEROL SULFATE HFA 108 (90 BASE) MCG/ACT IN AERS
2.0000 | INHALATION_SPRAY | RESPIRATORY_TRACT | Status: DC | PRN
  Filled 2016-09-05: qty 6.7

## 2016-09-05 MED ORDER — DEXAMETHASONE 4 MG PO TABS
10.0000 mg | ORAL_TABLET | Freq: Once | ORAL | Status: AC
  Administered 2016-09-05: 10 mg via ORAL
  Filled 2016-09-05: qty 3

## 2016-09-05 MED ORDER — IPRATROPIUM-ALBUTEROL 0.5-2.5 (3) MG/3ML IN SOLN
3.0000 mL | Freq: Once | RESPIRATORY_TRACT | Status: AC
  Administered 2016-09-05: 3 mL via RESPIRATORY_TRACT
  Filled 2016-09-05: qty 3

## 2016-09-05 MED ORDER — PREDNISONE 20 MG PO TABS
60.0000 mg | ORAL_TABLET | Freq: Once | ORAL | Status: AC
  Administered 2016-09-05: 60 mg via ORAL
  Filled 2016-09-05: qty 3

## 2016-09-05 NOTE — Discharge Instructions (Signed)
Do not smoke!  Do not use cocaine!

## 2016-09-05 NOTE — ED Notes (Signed)
Pt demonstrated appropriate use of albuterol inhaler.

## 2017-09-24 ENCOUNTER — Other Ambulatory Visit: Payer: Self-pay

## 2017-09-24 ENCOUNTER — Inpatient Hospital Stay (HOSPITAL_COMMUNITY)
Admission: EM | Admit: 2017-09-24 | Discharge: 2017-09-26 | DRG: 190 | Discharge status: Against Medical Advice | Payer: Medicaid Other | Attending: Internal Medicine | Admitting: Internal Medicine

## 2017-09-24 ENCOUNTER — Encounter (HOSPITAL_COMMUNITY): Payer: Self-pay

## 2017-09-24 DIAGNOSIS — Z590 Homelessness: Secondary | ICD-10-CM

## 2017-09-24 DIAGNOSIS — Z5321 Procedure and treatment not carried out due to patient leaving prior to being seen by health care provider: Secondary | ICD-10-CM | POA: Diagnosis not present

## 2017-09-24 DIAGNOSIS — F17210 Nicotine dependence, cigarettes, uncomplicated: Secondary | ICD-10-CM | POA: Diagnosis present

## 2017-09-24 DIAGNOSIS — J9602 Acute respiratory failure with hypercapnia: Secondary | ICD-10-CM | POA: Diagnosis present

## 2017-09-24 DIAGNOSIS — J9601 Acute respiratory failure with hypoxia: Secondary | ICD-10-CM | POA: Diagnosis present

## 2017-09-24 DIAGNOSIS — I429 Cardiomyopathy, unspecified: Secondary | ICD-10-CM | POA: Diagnosis present

## 2017-09-24 DIAGNOSIS — F1020 Alcohol dependence, uncomplicated: Secondary | ICD-10-CM | POA: Diagnosis present

## 2017-09-24 DIAGNOSIS — F1910 Other psychoactive substance abuse, uncomplicated: Secondary | ICD-10-CM | POA: Diagnosis present

## 2017-09-24 DIAGNOSIS — I472 Ventricular tachycardia: Secondary | ICD-10-CM | POA: Diagnosis not present

## 2017-09-24 DIAGNOSIS — I5022 Chronic systolic (congestive) heart failure: Secondary | ICD-10-CM | POA: Diagnosis present

## 2017-09-24 DIAGNOSIS — I493 Ventricular premature depolarization: Secondary | ICD-10-CM | POA: Diagnosis present

## 2017-09-24 DIAGNOSIS — E872 Acidosis: Secondary | ICD-10-CM | POA: Diagnosis present

## 2017-09-24 DIAGNOSIS — Z599 Problem related to housing and economic circumstances, unspecified: Secondary | ICD-10-CM

## 2017-09-24 DIAGNOSIS — J441 Chronic obstructive pulmonary disease with (acute) exacerbation: Principal | ICD-10-CM | POA: Diagnosis present

## 2017-09-24 DIAGNOSIS — Z915 Personal history of self-harm: Secondary | ICD-10-CM

## 2017-09-24 NOTE — ED Triage Notes (Signed)
Pt BIB [facility] d/t incr SOB & CP. Pt Has Hx of Pneumonia and was unable to start Rx.

## 2017-09-24 NOTE — ED Provider Notes (Signed)
[facility] EMERGENCY DEPARTMENT Provider Note   CSN: [id number] Arrival date & time: 09/24/17  2320     History   Chief Complaint Chief Complaint  Patient presents with  . Shortness of Breath  . Chest Pain    HPI Brian [name] is a 62 y.o. male.  Patient presents to the emergency department for evaluation of shortness of breath and cough.  Patient reports that he was recently diagnosed with pneumonia at [facility].  He did not fill his prescriptions because he cannot afford them.  Patient reports that he has had persistently worsening cough and shortness of breath.  He is experiencing pain in his chest with this.  He is unsure if he has had a fever.  He does not use oxygen at home, reports that [place].     Past Medical History:  Diagnosis Date  . Alcohol abuse    Heavy alcohol abuse and homelessness  . Arthritis   . CHF (congestive heart failure) (HCC)   . Cocaine abuse (HCC)    And crack   . COPD (chronic obstructive pulmonary disease) (HCC)   . Heart failure   . Homelessness   . Suicide attempt (HCC)   . Tobacco abuse     Patient Active Problem List   Diagnosis Date Noted  . Chronic systolic CHF (congestive heart failure) (HCC) 09/25/2017  . Polysubstance abuse (HCC) 10/20/2015  . Acute respiratory failure with hypoxia and hypercarbia (HCC) 09/30/2015  . COPD with acute exacerbation (HCC) 09/30/2015  . Substance induced mood disorder (HCC) 02/04/2013  . Polysubstance (excluding opioids) dependence, daily use (HCC) 02/03/2013  . Alcohol dependence (HCC) 02/03/2013  . Gout attack 11/12/2012  . Closed fracture of 5th metacarpal 11/12/2012  . Chest pain at rest 08/27/2012  . COPD (chronic obstructive pulmonary disease) (HCC)   . Homelessness   . Cocaine abuse [facility])     Past Surgical History:  Procedure Laterality Date  . INNER EAR SURGERY          Home Medications    Prior to Admission medications     Medication Sig Start Date End Date Taking? Authorizing Provider  labetalol (NORMODYNE) 100 MG tablet Take 1 tablet (100 mg total) by mouth 2 (two) times daily. Patient not taking: Reported on 09/25/2017 10/22/15   Joseph Art, DO  lisinopril (PRINIVIL,ZESTRIL) 5 MG tablet Take 1 tablet (5 mg total) by mouth daily. Patient not taking: Reported on 09/25/2017 10/22/15   Joseph Art, DO    Family History Family History  Problem Relation Age of Onset  . Coronary artery disease Unknown   . Diabetes Unknown   . Cancer Sister     Social History Social History   Tobacco Use  . Smoking status: Current Every Day Smoker    Packs/day: 2.00    Types: Cigarettes  . Smokeless tobacco: Never Used  Substance Use Topics  . Alcohol use: Yes    Comment: Heavy. 5-10 40oz daily  . Drug use: No    Types: Marijuana, Cocaine    Comment: Marijuana and cocaine, crack     Allergies   Patient has no known allergies.   Review of Systems Review of Systems  Respiratory: Positive for cough, chest tightness and shortness of breath.   Cardiovascular: Positive for chest pain.  All other systems reviewed and are negative.    Physical Exam Updated Vital Signs BP 110/71   Pulse 90   Resp (!)  29   Ht 5\' 7"  (1.702 m)   SpO2 98%   BMI 31.40 kg/m   Physical Exam  Constitutional: He is oriented to person, place, and time. He appears well-developed and well-nourished. No distress.  HENT:  Head: Normocephalic and atraumatic.  Right Ear: Hearing normal.  Left Ear: Hearing normal.  Nose: Nose normal.  Mouth/Throat: Oropharynx is clear and moist and mucous membranes are normal.  Eyes: Pupils are equal, round, and reactive to light. Conjunctivae and EOM are normal.  Neck: Normal range of motion. Neck supple.  Cardiovascular: Regular rhythm, S1 normal and S2 normal. Tachycardia present. Exam reveals no gallop and no friction rub.  No murmur heard. Pulmonary/Chest: Effort normal. No respiratory  distress. He has decreased breath sounds. He has wheezes. He has rhonchi. He exhibits no tenderness.  Abdominal: Soft. Normal appearance and bowel sounds are normal. There is no hepatosplenomegaly. There is no tenderness. There is no rebound, no guarding, no tenderness at McBurney's point and negative Murphy's sign. No hernia.  Musculoskeletal: Normal range of motion.  Neurological: He is alert and oriented to person, place, and time. He has normal strength. No cranial nerve deficit or sensory deficit. Coordination normal. GCS eye subscore is 4. GCS verbal subscore is 5. GCS motor subscore is 6.  Skin: Skin is warm, dry and intact. No rash noted. No cyanosis.  Psychiatric: He has a normal mood and affect. His speech is normal and behavior is normal. Thought content normal.  Nursing note and vitals reviewed.    ED Treatments / Results  Labs (all labs ordered are listed, but only abnormal results are displayed) Labs Reviewed  CBC WITH DIFFERENTIAL/PLATELET - Abnormal; Notable for the following components:      Result Value   HCT 52.2 (*)    All other components within normal limits  COMPREHENSIVE METABOLIC PANEL - Abnormal; Notable for the following components:   Glucose, Bld 115 (*)    Calcium 8.8 (*)    All other components within normal limits  BRAIN NATRIURETIC PEPTIDE - Abnormal; Notable for the following components:   B Natriuretic Peptide 1,179.2 (*)    All other components within normal limits  I-STAT ARTERIAL BLOOD GAS, ED - Abnormal; Notable for the following components:   pH, Arterial 7.253 (*)    pCO2 arterial 62.9 (*)    pO2, Arterial 70.0 (*)    All other components within normal limits  CULTURE, EXPECTORATED SPUTUM-ASSESSMENT  HIV ANTIBODY (ROUTINE TESTING)  TROPONIN I  TROPONIN I  TROPONIN I  I-STAT CG4 LACTIC ACID, ED  I-STAT TROPONIN, ED    EKG EKG Interpretation  Date/Time:  Monday September 24 2017 23:22:14 EDT Ventricular Rate:  107 PR Interval:    QRS  Duration: 82 QT Interval:  347 QTC Calculation: 463 R Axis:   81 Text Interpretation:  Sinus tachycardia Ventricular premature complex Aberrant complex Biatrial enlargement Borderline T wave abnormalities Baseline wander in lead(s) I aVR aVL No significant change since last tracing Confirmed by Gilda Crease [phone]) on 09/24/2017 11:28:27 PM   Radiology Dg Chest 2 View  Result Date: 09/25/2017 CLINICAL DATA:  Shortness of breath and cough for 1 day. History of COPD and congestive heart failure. Smoker. EXAM: CHEST - 2 VIEW COMPARISON:  09/04/2016 FINDINGS: Shallow inspiration. Cardiac enlargement. No vascular congestion or edema. No focal consolidation or airspace disease. No blunting of costophrenic angles. No pneumothorax. Mediastinal contours appear intact. Calcification of the aorta. Degenerative changes in the spine. IMPRESSION: Cardiac enlargement. No evidence  of active pulmonary disease. Aortic atherosclerosis. Electronically Signed   By: Burman Nieves M.D.   On: 09/25/2017 00:54    Procedures Procedures (including critical care time)  Medications Ordered in ED Medications  enoxaparin (LOVENOX) injection 40 mg (has no administration in time range)  sodium chloride flush (NS) 0.9 % injection 3 mL (has no administration in time range)  sodium chloride flush (NS) 0.9 % injection 3 mL (has no administration in time range)  sodium chloride flush (NS) 0.9 % injection 3 mL (has no administration in time range)  0.9 %  sodium chloride infusion (has no administration in time range)  acetaminophen (TYLENOL) tablet 650 mg (has no administration in time range)    Or  acetaminophen (TYLENOL) suppository 650 mg (has no administration in time range)  HYDROcodone-acetaminophen (NORCO/VICODIN) 5-325 MG per tablet 1-2 tablet (has no administration in time range)  ketorolac (TORADOL) 15 MG/ML injection 15 mg (has no administration in time range)  senna-docusate (Senokot-S) tablet 1 tablet  (has no administration in time range)  bisacodyl (DULCOLAX) EC tablet 5 mg (has no administration in time range)  ondansetron (ZOFRAN) tablet 4 mg (has no administration in time range)    Or  ondansetron (ZOFRAN) injection 4 mg (has no administration in time range)  methylPREDNISolone sodium succinate (SOLU-MEDROL) 125 mg/2 mL injection 60 mg (has no administration in time range)  azithromycin (ZITHROMAX) 500 mg in sodium chloride 0.9 % 250 mL IVPB (has no administration in time range)  LORazepam (ATIVAN) tablet 1 mg (has no administration in time range)    Or  LORazepam (ATIVAN) injection 1 mg (has no administration in time range)  thiamine (VITAMIN B-1) tablet 100 mg (has no administration in time range)    Or  thiamine (B-1) injection 100 mg (has no administration in time range)  folic acid (FOLVITE) tablet 1 mg (has no administration in time range)  multivitamin with minerals tablet 1 tablet (has no administration in time range)  LORazepam (ATIVAN) injection 0-4 mg (has no administration in time range)    Followed by  LORazepam (ATIVAN) injection 0-4 mg (has no administration in time range)  albuterol (PROVENTIL) (2.5 MG/3ML) 0.083% nebulizer solution 2.5 mg (has no administration in time range)  ipratropium-albuterol (DUONEB) 0.5-2.5 (3) MG/3ML nebulizer solution 3 mL (has no administration in time range)  albuterol (PROVENTIL,VENTOLIN) solution continuous neb (10 mg/hr Nebulization Given 09/25/17 0205)  methylPREDNISolone sodium succinate (SOLU-MEDROL) 125 mg/2 mL injection 125 mg (125 mg Intravenous Given 09/25/17 0104)     Initial Impression / Assessment and Plan / ED Course  I have reviewed the triage vital signs and the nursing notes.  Pertinent labs & imaging results that were available during my care of the patient were reviewed by me and considered in my medical decision making (see chart for details).     Patient presents to the ER for evaluation of chest pain and  shortness of breath.  Patient has a history of COPD.  He is currently homeless.  He reports that he recently had a pneumonia that he did not treat.  Patient noted to be tachypnea, mildly hypoxic.  He has a cough and significant congestion.  Chest x-ray, however, does not show evidence of pneumonia.  Patient treated with continuous nebulizer treatment and Solu-Medrol.  He continues to be mildly tachypneic, hypoxic with wheezing.  He will require hospitalization for further management.  CRITICAL CARE Performed by: Gilda Crease   Total critical care time: 30 minutes  Critical care  time was exclusive of separately billable procedures and treating other patients.  Critical care was necessary to treat or prevent imminent or life-threatening deterioration.  Critical care was time spent personally by me on the following activities: development of treatment plan with patient and/or surrogate as well as nursing, discussions with consultants, evaluation of patient's response to treatment, examination of patient, obtaining history from patient or surrogate, ordering and performing treatments and interventions, ordering and review of laboratory studies, ordering and review of radiographic studies, pulse oximetry and re-evaluation of patient's condition.  Final Clinical Impressions(s) / ED Diagnoses   Final diagnoses:  COPD exacerbation [facility])    ED Discharge Orders    None       Shakala Marlatt, Canary Brim, MD 09/25/17 [phone]

## 2017-09-25 ENCOUNTER — Encounter (HOSPITAL_COMMUNITY): Payer: Self-pay | Admitting: Family Medicine

## 2017-09-25 ENCOUNTER — Emergency Department (HOSPITAL_COMMUNITY): Payer: Medicaid Other

## 2017-09-25 DIAGNOSIS — Z599 Problem related to housing and economic circumstances, unspecified: Secondary | ICD-10-CM | POA: Diagnosis not present

## 2017-09-25 DIAGNOSIS — Z915 Personal history of self-harm: Secondary | ICD-10-CM | POA: Diagnosis not present

## 2017-09-25 DIAGNOSIS — J441 Chronic obstructive pulmonary disease with (acute) exacerbation: Secondary | ICD-10-CM | POA: Diagnosis present

## 2017-09-25 DIAGNOSIS — E872 Acidosis: Secondary | ICD-10-CM | POA: Diagnosis present

## 2017-09-25 DIAGNOSIS — I472 Ventricular tachycardia: Secondary | ICD-10-CM | POA: Diagnosis not present

## 2017-09-25 DIAGNOSIS — I493 Ventricular premature depolarization: Secondary | ICD-10-CM | POA: Diagnosis present

## 2017-09-25 DIAGNOSIS — J9602 Acute respiratory failure with hypercapnia: Secondary | ICD-10-CM | POA: Diagnosis present

## 2017-09-25 DIAGNOSIS — F1020 Alcohol dependence, uncomplicated: Secondary | ICD-10-CM | POA: Diagnosis present

## 2017-09-25 DIAGNOSIS — I5022 Chronic systolic (congestive) heart failure: Secondary | ICD-10-CM | POA: Diagnosis present

## 2017-09-25 DIAGNOSIS — J9601 Acute respiratory failure with hypoxia: Secondary | ICD-10-CM | POA: Diagnosis present

## 2017-09-25 DIAGNOSIS — Z590 Homelessness: Secondary | ICD-10-CM | POA: Diagnosis not present

## 2017-09-25 DIAGNOSIS — I429 Cardiomyopathy, unspecified: Secondary | ICD-10-CM | POA: Diagnosis present

## 2017-09-25 DIAGNOSIS — F1910 Other psychoactive substance abuse, uncomplicated: Secondary | ICD-10-CM | POA: Diagnosis present

## 2017-09-25 DIAGNOSIS — F17210 Nicotine dependence, cigarettes, uncomplicated: Secondary | ICD-10-CM | POA: Diagnosis present

## 2017-09-25 DIAGNOSIS — Z5321 Procedure and treatment not carried out due to patient leaving prior to being seen by health care provider: Secondary | ICD-10-CM | POA: Diagnosis not present

## 2017-09-25 DIAGNOSIS — F10239 Alcohol dependence with withdrawal, unspecified: Secondary | ICD-10-CM

## 2017-09-25 LAB — COMPREHENSIVE METABOLIC PANEL
ALBUMIN: 3.6 g/dL (ref 3.5–5.0)
ALK PHOS: 50 U/L (ref 38–126)
ALT: 61 U/L (ref 17–63)
AST: 39 U/L (ref 15–41)
Anion gap: 8 (ref 5–15)
BUN: 15 mg/dL (ref 6–20)
CALCIUM: 8.8 mg/dL — AB (ref 8.9–10.3)
CHLORIDE: 108 mmol/L (ref 101–111)
CO2: 28 mmol/L (ref 22–32)
CREATININE: 0.88 mg/dL (ref 0.61–1.24)
GFR calc Af Amer: >60 mL/min (ref >60)
GFR calc non Af Amer: >60 mL/min (ref >60)
GLUCOSE: 115 mg/dL — AB (ref 65–99)
Potassium: 4.7 mmol/L (ref 3.5–5.1)
SODIUM: 144 mmol/L (ref 135–145)
Total Bilirubin: 0.4 mg/dL (ref 0.3–1.2)
Total Protein: 6.6 g/dL (ref 6.5–8.1)

## 2017-09-25 LAB — I-STAT ARTERIAL BLOOD GAS, ED
Acid-base deficit: 1 mmol/L (ref 0.0–2.0)
BICARBONATE: 27.8 mmol/L (ref 20.0–28.0)
O2 Saturation: 90 %
PH ART: 7.253 — AB (ref 7.350–7.450)
PO2 ART: 70 mmHg — AB (ref 83.0–108.0)
TCO2: 30 mmol/L (ref 22–32)
pCO2 arterial: 62.9 mmHg — ABNORMAL HIGH (ref 32.0–48.0)

## 2017-09-25 LAB — CBC WITH DIFFERENTIAL/PLATELET
Abs Immature Granulocytes: 0 10*3/uL (ref 0.0–0.1)
BASOS ABS: 0.1 10*3/uL (ref 0.0–0.1)
Basophils Relative: 1 %
Eosinophils Absolute: 0.1 10*3/uL (ref 0.0–0.7)
Eosinophils Relative: 1 %
HCT: 52.2 % — ABNORMAL HIGH (ref 39.0–52.0)
HEMOGLOBIN: 15.9 g/dL (ref 13.0–17.0)
Immature Granulocytes: 1 %
Lymphocytes Relative: 28 %
Lymphs Abs: 2.5 10*3/uL (ref 0.7–4.0)
MCH: 30.4 pg (ref 26.0–34.0)
MCHC: 30.5 g/dL (ref 30.0–36.0)
MCV: 99.8 fL (ref 78.0–100.0)
MONO ABS: 0.8 10*3/uL (ref 0.1–1.0)
Monocytes Relative: 9 %
NEUTROS ABS: 5.3 10*3/uL (ref 1.7–7.7)
Neutrophils Relative %: 60 %
Platelets: 240 10*3/uL (ref 150–400)
RBC: 5.23 MIL/uL (ref 4.22–5.81)
RDW: 14.9 % (ref 11.5–15.5)
WBC: 8.8 10*3/uL (ref 4.0–10.5)

## 2017-09-25 LAB — RAPID URINE DRUG SCREEN, HOSP PERFORMED
AMPHETAMINES: NOT DETECTED
BENZODIAZEPINES: NOT DETECTED
COCAINE: POSITIVE — AB
OPIATES: NOT DETECTED
TETRAHYDROCANNABINOL: POSITIVE — AB

## 2017-09-25 LAB — ETHANOL: Alcohol, Ethyl (B): 84 mg/dL — ABNORMAL HIGH (ref <10)

## 2017-09-25 LAB — TROPONIN I
Troponin I: 0.03 ng/mL (ref <0.03)
Troponin I: 0.03 ng/mL (ref <0.03)
Troponin I: <0.03 ng/mL (ref <0.03)

## 2017-09-25 LAB — I-STAT TROPONIN, ED: Troponin i, poc: 0.03 ng/mL (ref 0.00–0.08)

## 2017-09-25 LAB — MAGNESIUM: Magnesium: 1.6 mg/dL — ABNORMAL LOW (ref 1.7–2.4)

## 2017-09-25 LAB — I-STAT CG4 LACTIC ACID, ED: Lactic Acid, Venous: 0.93 mmol/L (ref 0.5–1.9)

## 2017-09-25 LAB — MRSA PCR SCREENING: MRSA by PCR: NEGATIVE

## 2017-09-25 LAB — BRAIN NATRIURETIC PEPTIDE: B Natriuretic Peptide: 1179.2 pg/mL — ABNORMAL HIGH (ref 0.0–100.0)

## 2017-09-25 MED ORDER — SODIUM CHLORIDE 0.9% FLUSH: 3.0000 mL | INTRAVENOUS | Status: DC | PRN

## 2017-09-25 MED ORDER — ONDANSETRON HCL 4 MG PO TABS: 4.0000 mg | ORAL_TABLET | Freq: Four times a day (QID) | ORAL | Status: DC | PRN

## 2017-09-25 MED ORDER — LORAZEPAM 1 MG PO TABS: 1.0000 mg | ORAL_TABLET | Freq: Four times a day (QID) | ORAL | Status: DC | PRN

## 2017-09-25 MED ORDER — THIAMINE HCL 100 MG/ML IJ SOLN: 100.0000 mg | Freq: Every day | INTRAMUSCULAR | Status: DC

## 2017-09-25 MED ORDER — FOLIC ACID 1 MG PO TABS
1.0000 mg | ORAL_TABLET | Freq: Every day | ORAL | Status: DC
  Administered 2017-09-26: 1 mg via ORAL
  Filled 2017-09-25: qty 1

## 2017-09-25 MED ORDER — VITAMIN B-1 100 MG PO TABS
100.0000 mg | ORAL_TABLET | Freq: Every day | ORAL | Status: DC
  Administered 2017-09-26: 100 mg via ORAL
  Filled 2017-09-25: qty 1

## 2017-09-25 MED ORDER — LORAZEPAM 2 MG/ML IJ SOLN: 0.0000 mg | Freq: Four times a day (QID) | INTRAMUSCULAR | Status: DC

## 2017-09-25 MED ORDER — HYDROCODONE-ACETAMINOPHEN 5-325 MG PO TABS: 1.0000 | ORAL_TABLET | ORAL | Status: DC | PRN

## 2017-09-25 MED ORDER — SENNOSIDES-DOCUSATE SODIUM 8.6-50 MG PO TABS: 1.0000 | ORAL_TABLET | Freq: Every evening | ORAL | Status: DC | PRN

## 2017-09-25 MED ORDER — ADULT MULTIVITAMIN W/MINERALS CH
1.0000 | ORAL_TABLET | Freq: Every day | ORAL | Status: DC
  Administered 2017-09-26: 1 via ORAL
  Filled 2017-09-25: qty 1

## 2017-09-25 MED ORDER — ALBUTEROL (5 MG/ML) CONTINUOUS INHALATION SOLN
10.0000 mg/h | INHALATION_SOLUTION | Freq: Once | RESPIRATORY_TRACT | Status: AC
  Administered 2017-09-25: 10 mg/h via RESPIRATORY_TRACT
  Filled 2017-09-25 (×2): qty 20

## 2017-09-25 MED ORDER — LORAZEPAM 2 MG/ML IJ SOLN
0.0000 mg | Freq: Four times a day (QID) | INTRAMUSCULAR | Status: DC
  Administered 2017-09-25: 1 mg via INTRAVENOUS
  Administered 2017-09-25 – 2017-09-26 (×2): 2 mg via INTRAVENOUS
  Filled 2017-09-25 (×3): qty 1

## 2017-09-25 MED ORDER — METHYLPREDNISOLONE SODIUM SUCC 125 MG IJ SOLR
60.0000 mg | Freq: Four times a day (QID) | INTRAMUSCULAR | Status: DC
  Administered 2017-09-25 – 2017-09-26 (×5): 60 mg via INTRAVENOUS
  Filled 2017-09-25 (×5): qty 2

## 2017-09-25 MED ORDER — LORAZEPAM 2 MG/ML IJ SOLN: 1.0000 mg | Freq: Four times a day (QID) | INTRAMUSCULAR | Status: DC | PRN

## 2017-09-25 MED ORDER — LORAZEPAM 2 MG/ML IJ SOLN: 0.0000 mg | Freq: Two times a day (BID) | INTRAMUSCULAR | Status: DC

## 2017-09-25 MED ORDER — SODIUM CHLORIDE 0.9 % IV SOLN: 250.0000 mL | INTRAVENOUS | Status: DC | PRN

## 2017-09-25 MED ORDER — ENOXAPARIN SODIUM 40 MG/0.4ML ~~LOC~~ SOLN
40.0000 mg | SUBCUTANEOUS | Status: DC
  Administered 2017-09-25: 40 mg via SUBCUTANEOUS
  Filled 2017-09-25 (×2): qty 0.4

## 2017-09-25 MED ORDER — VITAMIN B-1 100 MG PO TABS: 100.0000 mg | ORAL_TABLET | Freq: Every day | ORAL | Status: DC

## 2017-09-25 MED ORDER — ACETAMINOPHEN 650 MG RE SUPP: 650.0000 mg | Freq: Four times a day (QID) | RECTAL | Status: DC | PRN

## 2017-09-25 MED ORDER — KETOROLAC TROMETHAMINE 15 MG/ML IJ SOLN: 15.0000 mg | Freq: Four times a day (QID) | INTRAMUSCULAR | Status: DC | PRN

## 2017-09-25 MED ORDER — METHYLPREDNISOLONE SODIUM SUCC 125 MG IJ SOLR
125.0000 mg | Freq: Once | INTRAMUSCULAR | Status: AC
  Administered 2017-09-25: 125 mg via INTRAVENOUS
  Filled 2017-09-25: qty 2

## 2017-09-25 MED ORDER — LORAZEPAM 1 MG PO TABS: 0.0000 mg | ORAL_TABLET | Freq: Two times a day (BID) | ORAL | Status: DC

## 2017-09-25 MED ORDER — SODIUM CHLORIDE 0.9 % IV SOLN
500.0000 mg | INTRAVENOUS | Status: DC
  Administered 2017-09-25 – 2017-09-26 (×2): 500 mg via INTRAVENOUS
  Filled 2017-09-25 (×2): qty 500

## 2017-09-25 MED ORDER — SODIUM CHLORIDE 0.9% FLUSH
3.0000 mL | Freq: Two times a day (BID) | INTRAVENOUS | Status: DC
  Administered 2017-09-25 – 2017-09-26 (×3): 3 mL via INTRAVENOUS

## 2017-09-25 MED ORDER — ALBUTEROL SULFATE (2.5 MG/3ML) 0.083% IN NEBU: 2.5000 mg | INHALATION_SOLUTION | RESPIRATORY_TRACT | Status: DC | PRN

## 2017-09-25 MED ORDER — BISACODYL 5 MG PO TBEC: 5.0000 mg | DELAYED_RELEASE_TABLET | Freq: Every day | ORAL | Status: DC | PRN

## 2017-09-25 MED ORDER — LORAZEPAM 1 MG PO TABS: 0.0000 mg | ORAL_TABLET | Freq: Four times a day (QID) | ORAL | Status: DC

## 2017-09-25 MED ORDER — IPRATROPIUM-ALBUTEROL 0.5-2.5 (3) MG/3ML IN SOLN
3.0000 mL | Freq: Three times a day (TID) | RESPIRATORY_TRACT | Status: DC
  Administered 2017-09-25 – 2017-09-26 (×3): 3 mL via RESPIRATORY_TRACT
  Filled 2017-09-25 (×4): qty 3

## 2017-09-25 MED ORDER — SODIUM CHLORIDE 0.9% FLUSH
3.0000 mL | Freq: Two times a day (BID) | INTRAVENOUS | Status: DC
  Administered 2017-09-25 (×2): 3 mL via INTRAVENOUS

## 2017-09-25 MED ORDER — ACETAMINOPHEN 325 MG PO TABS
650.0000 mg | ORAL_TABLET | Freq: Four times a day (QID) | ORAL | Status: DC | PRN
  Administered 2017-09-25: 650 mg via ORAL
  Filled 2017-09-25: qty 2

## 2017-09-25 MED ORDER — THIAMINE HCL 100 MG/ML IJ SOLN
100.0000 mg | Freq: Every day | INTRAMUSCULAR | Status: DC
  Administered 2017-09-25: 100 mg via INTRAVENOUS
  Filled 2017-09-25: qty 2

## 2017-09-25 MED ORDER — ONDANSETRON HCL 4 MG/2ML IJ SOLN: 4.0000 mg | Freq: Four times a day (QID) | INTRAMUSCULAR | Status: DC | PRN

## 2017-09-25 NOTE — H&P (Signed)
History and Physical    Brian [name] WAQ:[id number] DOB: 08-25-1955 DOA: 09/24/2017  PCP: Patient, [name]   Patient coming from: Home (homeless)   Chief Complaint: SOB, productive cough   HPI: Brian [name] is a 62 y.o. male with medical history significant for homelessness, polysubstance abuse, COPD, and chronic systolic CHF, now presenting to the emergency department for evaluation of progressive shortness of breath and productive cough.  History is limited due to the patient's clinical condition with acute respiratory distress.  He reports progressive worsening and shortness of breath and cough productive of thick dark sputum over the past several days.  Reports that he worsened acutely overnight.  He reported chest pain without alleviating or exacerbating factors.  Denies lower extremity swelling or tenderness.  Denies fevers or chills.  ED Course: Upon arrival to the ED, patient is found to be hypoxic in the low 80s on room air, tachypneic in the 30s, tachycardic in the 110s, and with stable blood pressure.  EKG features a sinus tachycardia with rate 107 and PVC.  Chest x-ray is notable for cardiomegaly, but no acute cardiopulmonary disease.  Chemistry panel and CBC are unremarkable, lactic acid and troponin are reassuringly normal, BNP is elevated at 1179, and ABG features a respiratory acidosis with pH 7.25, pCO2 63, and pO2 70.  He was treated with 125 mg IV Solu-Medrol, Ativan, 10 mg continuous albuterol neb, and supplemental oxygen in the ED.  Tachycardia resolved, work of breathing has improved, but the patient remains hypoxic and dyspneic at rest and will be admitted for ongoing evaluation and management of suspected exacerbation in COPD.  Review of Systems:  Unable to complete ROS secondary to patient's clinical condition.  Past Medical History:  Diagnosis Date  . Alcohol abuse    Heavy alcohol abuse and homelessness  . Arthritis   . CHF (congestive heart failure)  (HCC)   . Cocaine abuse (HCC)    And crack   . COPD (chronic obstructive pulmonary disease) (HCC)   . Heart failure   . Homelessness   . Suicide attempt (HCC)   . Tobacco abuse     Past Surgical History:  Procedure Laterality Date  . INNER EAR SURGERY       reports that he has been smoking cigarettes.  He has been smoking about 2.00 packs per day. He has never used smokeless tobacco. He reports that he drinks alcohol. He reports that he does not use drugs.  No Known Allergies  Family History  Problem Relation Age of Onset  . Coronary artery disease Unknown   . Diabetes Unknown   . Cancer Sister      Prior to Admission medications   Medication Sig Start Date End Date Taking? Authorizing Provider  labetalol (NORMODYNE) 100 MG tablet Take 1 tablet (100 mg total) by mouth 2 (two) times daily. Patient not taking: Reported on 09/25/2017 10/22/15   Joseph Art, DO  lisinopril (PRINIVIL,ZESTRIL) 5 MG tablet Take 1 tablet (5 mg total) by mouth daily. Patient not taking: Reported on 09/25/2017 10/22/15   Joseph Art, DO    Physical Exam: Vitals:   09/25/17 0346 09/25/17 0400 09/25/17 0430 09/25/17 0500  BP:  118/73 124/81 110/71  Pulse: 98 77 (!) 111 90  Resp: (!) 31 (!) 27 (!) 26 (!) 29  SpO2: 91% 96% 98% 98%  Height:          Constitutional: Tachypneic, labored respirations, no pallor, no diaphoresis Eyes: PERTLA, lids and  conjunctivae normal ENMT: Mucous membranes are moist. Posterior pharynx clear of any exudate or lesions.   Neck: normal, supple, no masses, no thyromegaly Respiratory: Tachypneic, dyspneic with speech, increased WOB. Diminished breath sounds, prolonged expiratory phase. No pallor or cyanosis.  Cardiovascular: S1 & S2 heard, regular rate and rhythm. Trace pretibial edema bilaterally. No significant JVD. Abdomen: No distension, no tenderness, soft. Bowel sounds active.  Musculoskeletal: no clubbing / cyanosis. No joint deformity upper and lower  extremities.   Skin: no significant rashes, lesions, ulcers. Warm, dry, well-perfused. Neurologic: No facial asymmetry. Sensation intact. Moving all extremities.  Psychiatric: Alert and oriented x 3. Pleasant and cooperative.     Labs on Admission: I have personally reviewed following labs and imaging studies  CBC: Recent Labs  Lab 09/25/17 0051  WBC 8.8  NEUTROABS 5.3  HGB 15.9  HCT 52.2*  MCV 99.8  PLT 240   Basic Metabolic Panel: Recent Labs  Lab 09/25/17 0051  NA 144  K 4.7  CL 108  CO2 28  GLUCOSE 115*  BUN 15  CREATININE 0.88  CALCIUM 8.8*   GFR: CrCl cannot be calculated (Unknown ideal weight.). Liver Function Tests: Recent Labs  Lab 09/25/17 0051  AST 39  ALT 61  ALKPHOS 50  BILITOT 0.4  PROT 6.6  ALBUMIN 3.6   No results for input(s): LIPASE, AMYLASE in the last 168 hours. No results for input(s): AMMONIA in the last 168 hours. Coagulation Profile: No results for input(s): INR, PROTIME in the last 168 hours. Cardiac Enzymes: No results for input(s): CKTOTAL, CKMB, CKMBINDEX, TROPONINI in the last 168 hours. BNP (last 3 results) No results for input(s): PROBNP in the last 8760 hours. HbA1C: No results for input(s): HGBA1C in the last 72 hours. CBG: No results for input(s): GLUCAP in the last 168 hours. Lipid Profile: No results for input(s): CHOL, HDL, LDLCALC, TRIG, CHOLHDL, LDLDIRECT in the last 72 hours. Thyroid Function Tests: No results for input(s): TSH, T4TOTAL, FREET4, T3FREE, THYROIDAB in the last 72 hours. Anemia Panel: No results for input(s): VITAMINB12, FOLATE, FERRITIN, TIBC, IRON, RETICCTPCT in the last 72 hours. Urine analysis:    Component Value Date/Time   COLORURINE Yellow 10/10/2012 0320   COLORURINE YELLOW 08/27/2012 0659   APPEARANCEUR Clear 10/10/2012 0320   LABSPEC 1.009 10/10/2012 0320   PHURINE 5.0 10/10/2012 0320   PHURINE 5.5 08/27/2012 0659   GLUCOSEU Negative 10/10/2012 0320   HGBUR Negative 10/10/2012  0320   HGBUR NEGATIVE 08/27/2012 0659   BILIRUBINUR Negative 10/10/2012 0320   KETONESUR Negative 10/10/2012 0320   KETONESUR NEGATIVE 08/27/2012 0659   PROTEINUR Negative 10/10/2012 0320   PROTEINUR NEGATIVE 08/27/2012 0659   UROBILINOGEN 0.2 08/27/2012 0659   NITRITE Negative 10/10/2012 0320   NITRITE NEGATIVE 08/27/2012 0659   LEUKOCYTESUR Negative 10/10/2012 0320   Sepsis Labs: @LABRCNTIP (procalcitonin:4,lacticidven:4) )No results found for this or any previous visit (from the past 240 hour(s)).   Radiological Exams on Admission: Dg Chest 2 View  Result Date: 09/25/2017 CLINICAL DATA:  Shortness of breath and cough for 1 day. History of COPD and congestive heart failure. Smoker. EXAM: CHEST - 2 VIEW COMPARISON:  09/04/2016 FINDINGS: Shallow inspiration. Cardiac enlargement. No vascular congestion or edema. No focal consolidation or airspace disease. No blunting of costophrenic angles. No pneumothorax. Mediastinal contours appear intact. Calcification of the aorta. Degenerative changes in the spine. IMPRESSION: Cardiac enlargement. No evidence of active pulmonary disease. Aortic atherosclerosis. Electronically Signed   By: Burman Nieves M.D.   On: 09/25/2017  00:54    EKG: Independently reviewed. Sinus tachycardia (rate 107), PVC.   Assessment/Plan   1. COPD exacerbation; acute hypoxic/hypercarbic respiratory failure  - Presents with progressive SOB and productive cough, found to be in distress and hypoxic, CXR clear, ABG with hypoxemia and hypercarbia  - No evidence for DVT/PE, appears euvolemic   - Treated in ED with 125 mg IV Solu-Medrol, continuous albuterol neb, and supplemental O2  - Check sputum culture, continue systemic steroids, schedule Duoneb, continue prn albuterol nebs, start azithromycin, continue supplemental O2 as needed    2. Chronic systolic CHF  - Appears well-compensated on admission  - SLIV, follow daily wt    3. Polysubstance abuse  - Included EtOH, no  withdrawal on admission  - Monitor with CIWA and prn Ativan, supplement vitamins    4. Chest pain - Atypical for ACS, initial troponin negative  - Suspected secondary to #1  - Continue cardiac monitoring, check serial troponin measurements    DVT prophylaxis: Lovenox Code Status: Full  Family Communication: Discussed with patient Consults called: None Admission status: Inpatient    Briscoe Deutscher, MD [facility] Pager [phone]  If 7PM-7AM, please contact night-coverage [email or web address] Password [email or web address]  09/25/2017, 5:04 AM

## 2017-09-25 NOTE — ED Notes (Signed)
Pt took himself off of nebulizer treatment. O2 saturation dropped to 79%. This RN placed patient on 6L O2 [place] and O2 sat still 84%. Pt placed on NRB and O2 sat 93% on 10L NRB. Will continue to monitor O2 sats.

## 2017-09-25 NOTE — ED Notes (Signed)
Social work at the bedside.

## 2017-09-25 NOTE — Progress Notes (Signed)
Admitted earlier today by Dr. Odie Sera, see H&P for details.  62 year old male with history of CHF, COPD, polysubstance abuse presented to the emergency department with complaints of shortness of breath and cough.  Noted for COPD exacerbation along with acute hypoxic and hypercarbic respiratory failure.  Was placed on BiPAP for short while.  Currently on Solu-Medrol, nebulizer treatments, azithromycin and supplemental oxygen.  Has systolic CHF, last EF was 25 to 30%, BNP on admission 1129.  Patient although does appear to be compensated and dry-continue to monitor.  Have ordered urine drug screen as well as alcohol level as patient does have history of polysubstance abuse.  Case management and social work also consulted as patient is homeless.  Time spent: 25 minutes  Shandricka Monroy D.O. [facility] Pager [phone]  If 7PM-7AM, please contact night-coverage [email or web address] Password [email or web address] 09/25/2017, 2:05 PM

## 2017-09-25 NOTE — Progress Notes (Addendum)
Dr. Catha Gosselin paged the following at 1847:   "2W13:Dice. 28 beat run of vtach. felt palpitations,but now resolved. Vitals stable,NSR. No orders for Mag check this admission. Or AM labs. TY"  Will await new orders.  Bill RN    Magnesium added on to previous lab draw.

## 2017-09-25 NOTE — ED Notes (Signed)
Pt's sats in the low 60s on 4L [place]. Pt remains alert, NRB placed, sats improved to 99% on 10L

## 2017-09-25 NOTE — Clinical Social Work Note (Signed)
Clinical Social Work Assessment  Patient Details  Name: Brian [name] MRN: [id number] Date of Birth: Oct 29, 1955  Date of referral:  09/25/17               Reason for consult:  Housing Concerns/Homelessness                Permission sought to share information with:  Other(none given ) Permission granted to share information::  No  Name::     none given  Agency::  none given  Relationship::   none given   Contact Information:  none given   Housing/Transportation Living arrangements for the past 2 months:  Homeless(living [place] the woods. ) Source of Information:  Patient Patient Interpreter Needed:  None Criminal Activity/Legal Involvement Pertinent to Current Situation/Hospitalization:  No - Comment as needed Significant Relationships:  None Lives with:  Self Do you feel safe going back to the place where you live?  No Need for family participation in patient care:  Yes (Comment)  Care giving concerns:  CSW spoke with pt at bedside. At this time pt expressed being homeless but denies having any concerns around being homeless.    Social Worker assessment / plan:  CSW spoke with pt at bedside. During this time CSW was informed that pt is from [place] but has been living in the woods. Pt expressed that pt doesn't wish to live in shelters as pt mentioned pt doesn't like it there. Pt identified niece and brother as supports for pt however pt expressed that pt chooses not to reach out to them. CSW was informed that pt sleeps in the woods and has slept in the woods for 13 years. Pt reports no issue with this at this time.   During this time pt was lying in bed on left side. Pt made little to no eye contact with CSW. Pt spoke in very short phrases and never expounded on information that pt gave CSW.   Employment status:  Unemployed Health and safety inspector:  Self Pay (Medicaid Pending) PT Recommendations:  Not assessed at this time Information / Referral to community resources:   Shelter(spoke with pt about shelter options. )  Patient/Family's Response to care: Pt's response to care was appropriate to diagnosis given. Pt did not seek any clarity from CSW on plan of care at this time.   Patient/Family's Understanding of and Emotional Response to Diagnosis, Current Treatment, and Prognosis:  No further questions or concerns have been presented to CSW at this time. Emotional response to care included pt being hopeful for assistance as provided.   Emotional Assessment Appearance:  Appears older than stated age Attitude/Demeanor/Rapport:  Engaged Affect (typically observed):  Calm, Appropriate, Pleasant Orientation:  Oriented to Self, Oriented to Situation, Oriented to Place, Oriented to  Time Alcohol / Substance use:  Not Applicable Psych involvement (Current and /or in the community):  No (Comment)(not at this time. )  Discharge Needs  Concerns to be addressed:  Homelessness Readmission within the last 30 days:  No Current discharge risk:  Lack of support system, Homeless Barriers to Discharge:  Continued Medical Work up   [employer], LCSWA 09/25/2017, 2:02 PM

## 2017-09-25 NOTE — Progress Notes (Signed)
CSW consulted for homeless issues. CSW went to speak with pt at bedside to assess pt-pt asleep and on BiPaP at this time. CSW to try back later once pt is awake.   Claude Manges Stpehen Petitjean, MSW, LCSW-A Emergency Department Clinical Social Worker [phone]

## 2017-09-25 NOTE — ED Notes (Addendum)
BiPap ordered by hospitalist Mikhail. Respiratory at bedside placing patient on bipap. Will continually reassess patient for toleration of bipap.

## 2017-09-25 NOTE — ED Notes (Addendum)
Patient requesting to be taken off of BiPap. Charge RN took patient off of bipap and gave him meal. Patient now on nasal cannula and oxygen saturation 94%. Will notify MD and RT.

## 2017-09-25 NOTE — ED Notes (Signed)
Patient oxygen saturation drop into 80s. Repositioned patient and placed pillow behind pack and applied verbal stimuli. MD notified and came to bed to assess patient. Requesting respiratory to come to bedside, perform ABG and place pt on Bipap. Respiratory called and en route.

## 2017-09-26 ENCOUNTER — Other Ambulatory Visit (HOSPITAL_COMMUNITY): Payer: Self-pay

## 2017-09-26 DIAGNOSIS — J441 Chronic obstructive pulmonary disease with (acute) exacerbation: Principal | ICD-10-CM

## 2017-09-26 LAB — HIV ANTIBODY (ROUTINE TESTING W REFLEX): HIV SCREEN 4TH GENERATION: NONREACTIVE

## 2017-09-26 MED ORDER — MAGNESIUM SULFATE 2 GM/50ML IV SOLN
2.0000 g | Freq: Once | INTRAVENOUS | Status: AC
  Administered 2017-09-26: 2 g via INTRAVENOUS
  Filled 2017-09-26: qty 50

## 2017-09-26 MED ORDER — METHYLPREDNISOLONE SODIUM SUCC 40 MG IJ SOLR: 40.0000 mg | Freq: Two times a day (BID) | INTRAMUSCULAR | Status: DC

## 2017-09-26 MED ORDER — AZITHROMYCIN 250 MG PO TABS: 500.0000 mg | ORAL_TABLET | Freq: Every day | ORAL | Status: DC

## 2017-09-26 NOTE — Care Management Note (Addendum)
Case Management Note  Patient Details  Name: Brian [name] MRN: [id number] Date of Birth: [birth year]  Subjective/Objective:   Patient is homeless, lives in the woods per CSW note, he will need follow up apt [facility] and will be able to get meds from the [facility],  He may need a Match letter at [employer] and a bus pass.    Patient left AMA.            Action/Plan: NCM will follow for dc needs.   Expected Discharge Date:  09/27/17               Expected Discharge Plan:  Homeless Shelter  In-House Referral:  Clinical Social Work  Discharge planning Services  CM Consult  Post Acute Care Choice:    Choice offered to:     DME Arranged:    DME Agency:     HH Arranged:    HH Agency:     Status of Service:  In process, will continue to follow  If discussed at Long Length of Stay Meetings, dates discussed:    Additional Comments:  Leone Haven, RN 09/26/2017, 7:33 AM

## 2017-09-26 NOTE — Plan of Care (Signed)
Discussed plan of care with patient.  Patient is concerned about breathing issues.  Encouraged patient to wear oxygen at all times.  Some teach back displayed.

## 2017-09-26 NOTE — Discharge Summary (Signed)
Physician Discharge Summary  CRASH LONTZ PXT:[id number] DOB: 1955/04/14 DOA: 09/24/2017  PCP: Patient, [name]  Admit date: 09/24/2017 Discharge date: 09/26/2017  Admitted From: Homeless Disposition:  AMA   Recommendations for Outpatient Follow-up:  1. Follow up with PCP in 1-2 weeks 2. Please obtain BMP/CBC in one week  Home Health:No Equipment/Devices:No  Discharge Condition:guarded CODE STATUS:FULL Diet recommendation: Heart Healthy  Brief/Interim Summary:  #) COPD exacerbation: Patient was admitted with severe COPD exacerbation requiring 6 L oxygen.  Patient was treated with scheduled bronchodilators, IV steroids.  Patient was also given IV antibiotics.  Today patient reported that he was "sick of the stem stuff" and demanded to be discharged. Explained to the patient that would be AGAINST MEDICAL ADVICE.  Patient verbalized that he understood there was a risk of getting worse or death.  Patient initially agreed to stay but then changed his mind and left AMA.  Explained to the patient again that he had been noted to have nonsustained V. tach overnight as well as a history of heart failure however patient reported that he was sick of it and left AMA despite being on 3 L nasal cannula and telemetry.  #) V. tach: Patient had 28 beat run of V. tach overnight on 09/26/2017.  Repeat echo was ordered.  Cardiology consult was being considered with patient left AMA before this could be addressed.  His elect lites were repleted and checked.  #)  chronic systolic heart failure: Patient is a history of chronic systolic heart failure possibly secondary to chronic alcohol abuse.  This is never been fully evaluated treated.  Repeat echo was pending due to V. tach and elevated BNP however this was not performed as patient left AMA.   #) Polysubstance abuse: Counseling was given.  Discharge Diagnoses:  Principal Problem:   COPD with acute exacerbation (HCC) Active Problems:   Alcohol  dependence (HCC)   Acute respiratory failure with hypoxia and hypercarbia (HCC)   Polysubstance abuse (HCC)   Chronic systolic CHF (congestive heart failure) (HCC)    Discharge Instructions    Follow-up Information    [facility] Follow up.   Why:  call to schedule your follow up apt Contact information: [place] [address] [phone]       [facility] Follow up.   Why:  you can use the pharmacy here for medication assistance Contact information: [address] [facility] [address] [phone]         No Known Allergies  Consultations:  None    Procedures/Studies: Dg Chest 2 View  Result Date: 09/25/2017 CLINICAL DATA:  Shortness of breath and cough for 1 day. History of COPD and congestive heart failure. Smoker. EXAM: CHEST - 2 VIEW COMPARISON:  09/04/2016 FINDINGS: Shallow inspiration. Cardiac enlargement. No vascular congestion or edema. No focal consolidation or airspace disease. No blunting of costophrenic angles. No pneumothorax. Mediastinal contours appear intact. Calcification of the aorta. Degenerative changes in the spine. IMPRESSION: Cardiac enlargement. No evidence of active pulmonary disease. Aortic atherosclerosis. Electronically Signed   By: Burman Nieves M.D.   On: 09/25/2017 00:54       Subjective:   Discharge Exam: Vitals:   09/26/17 0843 09/26/17 0900  BP: (!) 152/103 139/90  Pulse:    Resp:    Temp:  98.2 F (36.8 C)  SpO2: 97%    Vitals:   09/26/17 0651 09/26/17 0810 09/26/17 0843 09/26/17  0900  BP: 133/88  (!) 152/103 139/90  Pulse: 89     Resp:      Temp:    98.2 F (36.8 C)  TempSrc:    Oral  SpO2:  100% 97%   Weight:      Height:        General: Pt is alert, awake, not in acute distress Cardiovascular: Regular rate and rhythm, distant heart sounds, no murmurs Respiratory: Mild expiratory  wheezing, no increased work of breathing, scattered rhonchi, no crackles Abdominal: Soft, NT, ND, bowel sounds + Extremities: no edema    The results of significant diagnostics from this hospitalization (including imaging, microbiology, ancillary and laboratory) are listed below for reference.     Microbiology: Recent Results (from the past 240 hour(s))  MRSA PCR Screening     Status: None   Collection Time: 09/25/17  2:22 PM  Result Value Ref Range Status   MRSA by PCR NEGATIVE NEGATIVE Final    Comment:        The GeneXpert MRSA Assay (FDA approved for NASAL specimens only), is one component of a comprehensive MRSA colonization surveillance program. It is not intended to diagnose MRSA infection nor to guide or monitor treatment for MRSA infections. Performed at [facility], [address] [place], [place] [address]      Labs: BNP (last 3 results) Recent Labs    09/25/17 0051  BNP 1,179.2*   Basic Metabolic Panel: Recent Labs  Lab 09/25/17 0051 09/25/17 1657  NA 144  --   K 4.7  --   CL 108  --   CO2 28  --   GLUCOSE 115*  --   BUN 15  --   CREATININE 0.88  --   CALCIUM 8.8*  --   MG  --  1.6*   Liver Function Tests: Recent Labs  Lab 09/25/17 0051  AST 39  ALT 61  ALKPHOS 50  BILITOT 0.4  PROT 6.6  ALBUMIN 3.6   No results for input(s): LIPASE, AMYLASE in the last 168 hours. No results for input(s): AMMONIA in the last 168 hours. CBC: Recent Labs  Lab 09/25/17 0051  WBC 8.8  NEUTROABS 5.3  HGB 15.9  HCT 52.2*  MCV 99.8  PLT 240   Cardiac Enzymes: Recent Labs  Lab 09/25/17 0555 09/25/17 1101 09/25/17 1657  TROPONINI 0.03* 0.03* <0.03   BNP: Invalid input(s): POCBNP CBG: No results for input(s): GLUCAP in the last 168 hours. D-Dimer No results for input(s): DDIMER in the last 72 hours. Hgb A1c No results for input(s): HGBA1C in the last 72 hours. Lipid Profile No results for input(s): CHOL, HDL, LDLCALC, TRIG, CHOLHDL,  LDLDIRECT in the last 72 hours. Thyroid function studies No results for input(s): TSH, T4TOTAL, T3FREE, THYROIDAB in the last 72 hours.  Invalid input(s): FREET3 Anemia work up No results for input(s): VITAMINB12, FOLATE, FERRITIN, TIBC, IRON, RETICCTPCT in the last 72 hours. Urinalysis    Component Value Date/Time   COLORURINE Yellow 10/10/2012 0320   COLORURINE YELLOW 08/27/2012 0659   APPEARANCEUR Clear 10/10/2012 0320   LABSPEC 1.009 10/10/2012 0320   PHURINE 5.0 10/10/2012 0320   PHURINE 5.5 08/27/2012 0659   GLUCOSEU Negative 10/10/2012 0320   HGBUR Negative 10/10/2012 0320   HGBUR NEGATIVE 08/27/2012 0659   BILIRUBINUR Negative 10/10/2012 0320   KETONESUR Negative 10/10/2012 0320   KETONESUR NEGATIVE 08/27/2012 0659   PROTEINUR Negative 10/10/2012 0320   PROTEINUR NEGATIVE 08/27/2012 0659   UROBILINOGEN 0.2  08/27/2012 0659   NITRITE Negative 10/10/2012 0320   NITRITE NEGATIVE 08/27/2012 0659   LEUKOCYTESUR Negative 10/10/2012 0320   Sepsis Labs Invalid input(s): PROCALCITONIN,  WBC,  LACTICIDVEN Microbiology Recent Results (from the past 240 hour(s))  MRSA PCR Screening     Status: None   Collection Time: 09/25/17  2:22 PM  Result Value Ref Range Status   MRSA by PCR NEGATIVE NEGATIVE Final    Comment:        The GeneXpert MRSA Assay (FDA approved for NASAL specimens only), is one component of a comprehensive MRSA colonization surveillance program. It is not intended to diagnose MRSA infection nor to guide or monitor treatment for MRSA infections. Performed at [facility], [address] [place], [place] [address]      Time coordinating discharge: Over 30 minutes  SIGNED:   Delaine Lame, MD  [facility] 09/26/2017, 3:28 PM  If 7PM-7AM, please contact night-coverage [email or web address] Password [email or web address]

## 2017-09-26 NOTE — Progress Notes (Addendum)
Patient told NT he wanted "this damn stuff" off of him, referring to the Tele, and refuses to allow this RN to put leads on until d/c.  Patient has 3 LNC and is requesting his "breather" and to get out of here.  I attempted to instruct the patient re: his oxygen status and that he is currently on 3 LNC.  He has kept the oxygen on at this time and is eating lunch, but is adamant about leaving.  Dr. Clearnce Sorrel paged.  Dr. Clearnce Sorrel spoke with patient and patient agreed to stay if he does not have to have the Telemetry on and he is allowed to have a regular diet.  Patient still threatening to leave if he does not get the diet he wants.  He has refused any telemetry/cardiac monitoring at this time.

## 2017-09-26 NOTE — Progress Notes (Signed)
PROGRESS NOTE    EVA VALLEE  WUJ:[id number] DOB: [birth year] DOA: 09/24/2017 PCP: Patient, [name]    Brief Narrative:  62 year old man with past medical history relevant for polysubstance abuse, COPD, tobacco abuse, chronic systolic heart failure with EF of 25 to 30% by echo on 10/21/2015 who presented to the ED with shortness of breath and productive cough concerning for COPD exacerbation.  Patient developed a 27 beat run of V. tach overnight.   Assessment & Plan:   Principal Problem:   COPD with acute exacerbation (HCC) Active Problems:   Alcohol dependence (HCC)   Acute respiratory failure with hypoxia and hypercarbia (HCC)   Polysubstance abuse (HCC)   Chronic systolic CHF (congestive heart failure) (HCC)   #) Acute hypoxic and hypercarbic respiratory failure due to COPD exacerbation: Improving however patient is still on 6 L nasal cannula. -Decrease methylprednisolone to 40 mg every 12 IV -Continue scheduled bronchodilators -Continue IV azithromycin -Encourage tobacco cessation -Wean oxygen  #) Ventricular tachycardia: Overnight while on telemetry patient developed a 28 run beat of ventricular tachycardia.  Suspect most likely related to his underlying cardiomyopathy and is electro abnormalities. -Supplement letter lites -Continue telemetry -Echo pending -We will consider starting amiodarone  #) Polysubstance abuse: Patient has history of significant alcohol of multiple substance use. -CIWA protocol -Continue thiamine and folate  #) Chronic systolic heart failure: Noted to have quite an elevated BNP on presentation and history of heart failure.  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 -Repeat echo -We will consider IV diuresis if patient appears to be fluid overloaded was difficult to get off of oxygen  #) Social: Patient is homeless and cannot afford any medications. -Social work and case  management consult  Fluids: Tolerating p.o. Electrolytes: Monitor and supplement Nutrition: Heart healthy diet  Prophylaxis: Enoxaparin  Disposition: Pending improved respiratory status  Full code    Consultants:   None  Procedures:   Echo 09/26/2017 pending  Antimicrobials:   IV azithromycin started 09/25/2017   Subjective: Patient reports he is doing better.  He denies any chest pain, nausea, vomiting, diarrhea, abdominal pain.  He reports that he continues to have a cough and some shortness of breath particularly on exertion.  Objective: Vitals:   09/26/17 0651 09/26/17 0810 09/26/17 0843 09/26/17 0900  BP: 133/88  (!) 152/103 139/90  Pulse: 89     Resp:      Temp:    98.2 F (36.8 C)  TempSrc:    Oral  SpO2:  100% 97%   Weight:      Height:        Intake/Output Summary (Last 24 hours) at 09/26/2017 1119 Last data filed at 09/26/2017 1027 Gross per 24 hour  Intake 951.28 ml  Output 2375 ml  Net -1423.72 ml   Filed Weights   09/25/17 1459 09/26/17 0413  Weight: 87.3 kg (192 lb 7.4 oz) 88.4 kg (194 lb 14.2 oz)    Examination:  General exam: No acute distress Respiratory system: No increased work of breathing, scattered rhonchi throughout lung fields, no wheezes or crackles Cardiovascular system: Distant heart sounds, regular rate and rhythm, no murmurs Gastrointestinal system: Abdomen is nondistended, soft and nontender. No organomegaly or masses felt. Normal bowel sounds heard. Central nervous system: Alert and oriented. No focal neurological deficits. Extremities: Trace lower extremity edema. Skin: No rashes on visible skin Psychiatry: Judgement and insight appear normal. Mood & affect appropriate.     Data  Reviewed: I have personally reviewed following labs and imaging studies  CBC: Recent Labs  Lab 09/25/17 0051  WBC 8.8  NEUTROABS 5.3  HGB 15.9  HCT 52.2*  MCV 99.8  PLT 240   Basic Metabolic Panel: Recent Labs  Lab 09/25/17 0051  09/25/17 1657  NA 144  --   K 4.7  --   CL 108  --   CO2 28  --   GLUCOSE 115*  --   BUN 15  --   CREATININE 0.88  --   CALCIUM 8.8*  --   MG  --  1.6*   GFR: Estimated Creatinine Clearance: 95.9 mL/min (by C-G formula based on SCr of 0.88 mg/dL). Liver Function Tests: Recent Labs  Lab 09/25/17 0051  AST 39  ALT 61  ALKPHOS 50  BILITOT 0.4  PROT 6.6  ALBUMIN 3.6   No results for input(s): LIPASE, AMYLASE in the last 168 hours. No results for input(s): AMMONIA in the last 168 hours. Coagulation Profile: No results for input(s): INR, PROTIME in the last 168 hours. Cardiac Enzymes: Recent Labs  Lab 09/25/17 0555 09/25/17 1101 09/25/17 1657  TROPONINI 0.03* 0.03* <0.03   BNP (last 3 results) No results for input(s): PROBNP in the last 8760 hours. HbA1C: No results for input(s): HGBA1C in the last 72 hours. CBG: No results for input(s): GLUCAP in the last 168 hours. Lipid Profile: No results for input(s): CHOL, HDL, LDLCALC, TRIG, CHOLHDL, LDLDIRECT in the last 72 hours. Thyroid Function Tests: No results for input(s): TSH, T4TOTAL, FREET4, T3FREE, THYROIDAB in the last 72 hours. Anemia Panel: No results for input(s): VITAMINB12, FOLATE, FERRITIN, TIBC, IRON, RETICCTPCT in the last 72 hours. Sepsis Labs: Recent Labs  Lab 09/25/17 0058  LATICACIDVEN 0.93    Recent Results (from the past 240 hour(s))  MRSA PCR Screening     Status: None   Collection Time: 09/25/17  2:22 PM  Result Value Ref Range Status   MRSA by PCR NEGATIVE NEGATIVE Final    Comment:        The GeneXpert MRSA Assay (FDA approved for NASAL specimens only), is one component of a comprehensive MRSA colonization surveillance program. It is not intended to diagnose MRSA infection nor to guide or monitor treatment for MRSA infections. Performed at [facility], [address], [place], [place] [address]          Radiology Studies: Dg Chest 2 View  Result Date:  09/25/2017 CLINICAL DATA:  Shortness of breath and cough for 1 day. History of COPD and congestive heart failure. Smoker. EXAM: CHEST - 2 VIEW COMPARISON:  09/04/2016 FINDINGS: Shallow inspiration. Cardiac enlargement. No vascular congestion or edema. No focal consolidation or airspace disease. No blunting of costophrenic angles. No pneumothorax. Mediastinal contours appear intact. Calcification of the aorta. Degenerative changes in the spine. IMPRESSION: Cardiac enlargement. No evidence of active pulmonary disease. Aortic atherosclerosis. Electronically Signed   By: Burman Nieves M.D.   On: 09/25/2017 00:54        Scheduled Meds: . enoxaparin (LOVENOX) injection  40 mg Subcutaneous Q24H  . folic acid  1 mg Oral Daily  . ipratropium-albuterol  3 mL Nebulization TID  . LORazepam  0-4 mg Intravenous Q6H   Followed by  . [START ON 09/27/2017] LORazepam  0-4 mg Intravenous Q12H  . methylPREDNISolone (SOLU-MEDROL) injection  60 mg Intravenous Q6H  . multivitamin with minerals  1 tablet Oral Daily  . sodium chloride flush  3 mL Intravenous Q12H  .  sodium chloride flush  3 mL Intravenous Q12H  . thiamine  100 mg Oral Daily   Or  . thiamine  100 mg Intravenous Daily   Continuous Infusions: . sodium chloride    . azithromycin 500 mg (09/26/17 0600)     LOS: 1 day    Time spent: 35    Delaine Lame, MD [facility]   If 7PM-7AM, please contact night-coverage [email or web address] Password [email or web address] 09/26/2017, 11:19 AM

## 2017-09-27 ENCOUNTER — Emergency Department (HOSPITAL_COMMUNITY): Payer: Medicaid Other

## 2017-09-27 ENCOUNTER — Inpatient Hospital Stay (HOSPITAL_COMMUNITY)
Admission: EM | Admit: 2017-09-27 | Discharge: 2017-10-01 | DRG: 291 | Disposition: A | Discharge status: Home / Self Care | Payer: Medicaid Other | Attending: Internal Medicine | Admitting: Internal Medicine

## 2017-09-27 ENCOUNTER — Other Ambulatory Visit: Payer: Self-pay

## 2017-09-27 ENCOUNTER — Encounter (HOSPITAL_COMMUNITY): Payer: Self-pay | Admitting: Emergency Medicine

## 2017-09-27 ENCOUNTER — Inpatient Hospital Stay (HOSPITAL_COMMUNITY): Payer: Medicaid Other

## 2017-09-27 DIAGNOSIS — F10230 Alcohol dependence with withdrawal, uncomplicated: Secondary | ICD-10-CM

## 2017-09-27 DIAGNOSIS — R0602 Shortness of breath: Secondary | ICD-10-CM | POA: Diagnosis present

## 2017-09-27 DIAGNOSIS — I5022 Chronic systolic (congestive) heart failure: Secondary | ICD-10-CM | POA: Diagnosis present

## 2017-09-27 DIAGNOSIS — I509 Heart failure, unspecified: Secondary | ICD-10-CM

## 2017-09-27 DIAGNOSIS — F1410 Cocaine abuse, uncomplicated: Secondary | ICD-10-CM | POA: Diagnosis present

## 2017-09-27 DIAGNOSIS — F1020 Alcohol dependence, uncomplicated: Secondary | ICD-10-CM | POA: Diagnosis present

## 2017-09-27 DIAGNOSIS — J9621 Acute and chronic respiratory failure with hypoxia: Secondary | ICD-10-CM | POA: Diagnosis present

## 2017-09-27 DIAGNOSIS — I428 Other cardiomyopathies: Secondary | ICD-10-CM | POA: Diagnosis present

## 2017-09-27 DIAGNOSIS — J9602 Acute respiratory failure with hypercapnia: Secondary | ICD-10-CM

## 2017-09-27 DIAGNOSIS — R402252 Coma scale, best verbal response, oriented, at arrival to emergency department: Secondary | ICD-10-CM | POA: Diagnosis present

## 2017-09-27 DIAGNOSIS — Z5900 Homelessness unspecified: Secondary | ICD-10-CM

## 2017-09-27 DIAGNOSIS — I472 Ventricular tachycardia: Secondary | ICD-10-CM | POA: Diagnosis present

## 2017-09-27 DIAGNOSIS — R402362 Coma scale, best motor response, obeys commands, at arrival to emergency department: Secondary | ICD-10-CM | POA: Diagnosis present

## 2017-09-27 DIAGNOSIS — I5043 Acute on chronic combined systolic (congestive) and diastolic (congestive) heart failure: Principal | ICD-10-CM | POA: Diagnosis present

## 2017-09-27 DIAGNOSIS — Z9119 Patient's noncompliance with other medical treatment and regimen: Secondary | ICD-10-CM | POA: Diagnosis not present

## 2017-09-27 DIAGNOSIS — J9622 Acute and chronic respiratory failure with hypercapnia: Secondary | ICD-10-CM | POA: Diagnosis present

## 2017-09-27 DIAGNOSIS — J9601 Acute respiratory failure with hypoxia: Secondary | ICD-10-CM | POA: Diagnosis present

## 2017-09-27 DIAGNOSIS — R402142 Coma scale, eyes open, spontaneous, at arrival to emergency department: Secondary | ICD-10-CM | POA: Diagnosis present

## 2017-09-27 DIAGNOSIS — Z915 Personal history of self-harm: Secondary | ICD-10-CM | POA: Diagnosis not present

## 2017-09-27 DIAGNOSIS — F17210 Nicotine dependence, cigarettes, uncomplicated: Secondary | ICD-10-CM | POA: Diagnosis present

## 2017-09-27 DIAGNOSIS — F17200 Nicotine dependence, unspecified, uncomplicated: Secondary | ICD-10-CM | POA: Diagnosis present

## 2017-09-27 DIAGNOSIS — Z79899 Other long term (current) drug therapy: Secondary | ICD-10-CM

## 2017-09-27 DIAGNOSIS — F1920 Other psychoactive substance dependence, uncomplicated: Secondary | ICD-10-CM | POA: Diagnosis present

## 2017-09-27 DIAGNOSIS — J441 Chronic obstructive pulmonary disease with (acute) exacerbation: Secondary | ICD-10-CM | POA: Diagnosis present

## 2017-09-27 DIAGNOSIS — Z8701 Personal history of pneumonia (recurrent): Secondary | ICD-10-CM | POA: Diagnosis not present

## 2017-09-27 LAB — I-STAT VENOUS BLOOD GAS, ED
Acid-Base Excess: 8 mmol/L — ABNORMAL HIGH (ref 0.0–2.0)
Bicarbonate: 37 mmol/L — ABNORMAL HIGH (ref 20.0–28.0)
O2 Saturation: 75 %
PH VEN: 7.325 (ref 7.250–7.430)
TCO2: 39 mmol/L — ABNORMAL HIGH (ref 22–32)
pCO2, Ven: 71 mmHg (ref 44.0–60.0)
pO2, Ven: 45 mmHg (ref 32.0–45.0)

## 2017-09-27 LAB — I-STAT ARTERIAL BLOOD GAS, ED
ACID-BASE EXCESS: 7 mmol/L — AB (ref 0.0–2.0)
Acid-Base Excess: 9 mmol/L — ABNORMAL HIGH (ref 0.0–2.0)
Bicarbonate: 36.1 mmol/L — ABNORMAL HIGH (ref 20.0–28.0)
Bicarbonate: 38.1 mmol/L — ABNORMAL HIGH (ref 20.0–28.0)
O2 SAT: 90 %
O2 Saturation: 88 %
PH ART: 7.344 — AB (ref 7.350–7.450)
Patient temperature: 98.6
TCO2: 38 mmol/L — ABNORMAL HIGH (ref 22–32)
TCO2: 40 mmol/L — AB (ref 22–32)
pCO2 arterial: 69.6 mmHg (ref 32.0–48.0)
pCO2 arterial: 69.9 mmHg (ref 32.0–48.0)
pH, Arterial: 7.323 — ABNORMAL LOW (ref 7.350–7.450)
pO2, Arterial: 62 mmHg — ABNORMAL LOW (ref 83.0–108.0)
pO2, Arterial: 65 mmHg — ABNORMAL LOW (ref 83.0–108.0)

## 2017-09-27 LAB — CBC WITH DIFFERENTIAL/PLATELET
Abs Immature Granulocytes: 0.1 10*3/uL (ref 0.0–0.1)
BASOS ABS: 0 10*3/uL (ref 0.0–0.1)
BASOS PCT: 0 %
EOS ABS: 0 10*3/uL (ref 0.0–0.7)
EOS PCT: 0 %
HCT: 50.5 % (ref 39.0–52.0)
Hemoglobin: 15.4 g/dL (ref 13.0–17.0)
Immature Granulocytes: 0 %
LYMPHS PCT: 13 %
Lymphs Abs: 1.9 10*3/uL (ref 0.7–4.0)
MCH: 30.7 pg (ref 26.0–34.0)
MCHC: 30.5 g/dL (ref 30.0–36.0)
MCV: 100.8 fL — ABNORMAL HIGH (ref 78.0–100.0)
Monocytes Absolute: 1.1 10*3/uL — ABNORMAL HIGH (ref 0.1–1.0)
Monocytes Relative: 8 %
Neutro Abs: 11.1 10*3/uL — ABNORMAL HIGH (ref 1.7–7.7)
Neutrophils Relative %: 79 %
PLATELETS: 207 10*3/uL (ref 150–400)
RBC: 5.01 MIL/uL (ref 4.22–5.81)
RDW: 15.1 % (ref 11.5–15.5)
WBC: 14.1 10*3/uL — AB (ref 4.0–10.5)

## 2017-09-27 LAB — BASIC METABOLIC PANEL
Anion gap: 8 (ref 5–15)
BUN: 22 mg/dL — AB (ref 6–20)
CALCIUM: 9 mg/dL (ref 8.9–10.3)
CO2: 33 mmol/L — ABNORMAL HIGH (ref 22–32)
CREATININE: 0.9 mg/dL (ref 0.61–1.24)
Chloride: 102 mmol/L (ref 101–111)
GFR calc Af Amer: >60 mL/min (ref >60)
Glucose, Bld: 95 mg/dL (ref 65–99)
POTASSIUM: 4.2 mmol/L (ref 3.5–5.1)
SODIUM: 143 mmol/L (ref 135–145)

## 2017-09-27 LAB — BRAIN NATRIURETIC PEPTIDE: B Natriuretic Peptide: 1669.2 pg/mL — ABNORMAL HIGH (ref 0.0–100.0)

## 2017-09-27 LAB — RAPID URINE DRUG SCREEN, HOSP PERFORMED
AMPHETAMINES: NOT DETECTED
BENZODIAZEPINES: NOT DETECTED
Cocaine: POSITIVE — AB
Opiates: NOT DETECTED
Tetrahydrocannabinol: NOT DETECTED

## 2017-09-27 LAB — ECHOCARDIOGRAM COMPLETE
Height: 67 in
Weight: 3030.0022 oz

## 2017-09-27 LAB — I-STAT TROPONIN, ED: TROPONIN I, POC: 0.05 ng/mL (ref 0.00–0.08)

## 2017-09-27 LAB — ETHANOL

## 2017-09-27 MED ORDER — FOLIC ACID 1 MG PO TABS: 1.0000 mg | ORAL_TABLET | Freq: Every day | ORAL | Status: DC

## 2017-09-27 MED ORDER — LISINOPRIL 5 MG PO TABS
5.0000 mg | ORAL_TABLET | Freq: Every day | ORAL | Status: DC
  Administered 2017-09-27 – 2017-10-01 (×5): 5 mg via ORAL
  Filled 2017-09-27 (×5): qty 1

## 2017-09-27 MED ORDER — SODIUM CHLORIDE 0.9 % IV SOLN
250.0000 mL | INTRAVENOUS | Status: DC | PRN
  Administered 2017-09-28: 250 mL via INTRAVENOUS

## 2017-09-27 MED ORDER — FUROSEMIDE 10 MG/ML IJ SOLN
60.0000 mg | Freq: Two times a day (BID) | INTRAMUSCULAR | Status: AC
  Administered 2017-09-27 – 2017-09-29 (×5): 60 mg via INTRAVENOUS
  Filled 2017-09-27 (×5): qty 6

## 2017-09-27 MED ORDER — ACETAMINOPHEN 325 MG PO TABS: 650.0000 mg | ORAL_TABLET | ORAL | Status: DC | PRN

## 2017-09-27 MED ORDER — IPRATROPIUM-ALBUTEROL 0.5-2.5 (3) MG/3ML IN SOLN: 3.0000 mL | RESPIRATORY_TRACT | Status: DC | PRN

## 2017-09-27 MED ORDER — ADULT MULTIVITAMIN W/MINERALS CH: 1.0000 | ORAL_TABLET | Freq: Every day | ORAL | Status: DC

## 2017-09-27 MED ORDER — FOLIC ACID 5 MG/ML IJ SOLN
1.0000 mg | Freq: Every day | INTRAMUSCULAR | Status: DC
  Administered 2017-09-27 – 2017-09-28 (×2): 1 mg via INTRAVENOUS
  Filled 2017-09-27 (×3): qty 0.2

## 2017-09-27 MED ORDER — NICOTINE 21 MG/24HR TD PT24
21.0000 mg | MEDICATED_PATCH | Freq: Every day | TRANSDERMAL | Status: DC
  Administered 2017-09-27 – 2017-10-01 (×5): 21 mg via TRANSDERMAL
  Filled 2017-09-27 (×5): qty 1

## 2017-09-27 MED ORDER — IPRATROPIUM-ALBUTEROL 0.5-2.5 (3) MG/3ML IN SOLN
RESPIRATORY_TRACT | Status: AC
  Administered 2017-09-27: 3 mL
  Filled 2017-09-27: qty 3

## 2017-09-27 MED ORDER — LORAZEPAM 2 MG/ML IJ SOLN: 2.0000 mg | INTRAMUSCULAR | Status: DC | PRN

## 2017-09-27 MED ORDER — METHYLPREDNISOLONE SODIUM SUCC 125 MG IJ SOLR
125.0000 mg | Freq: Once | INTRAMUSCULAR | Status: AC
  Administered 2017-09-27: 125 mg via INTRAVENOUS
  Filled 2017-09-27: qty 2

## 2017-09-27 MED ORDER — LABETALOL HCL 100 MG PO TABS
100.0000 mg | ORAL_TABLET | Freq: Two times a day (BID) | ORAL | Status: DC
  Administered 2017-09-27 – 2017-09-28 (×3): 100 mg via ORAL
  Filled 2017-09-27 (×3): qty 1

## 2017-09-27 MED ORDER — IPRATROPIUM-ALBUTEROL 0.5-2.5 (3) MG/3ML IN SOLN
3.0000 mL | Freq: Four times a day (QID) | RESPIRATORY_TRACT | Status: DC
  Administered 2017-09-27: 3 mL via RESPIRATORY_TRACT
  Filled 2017-09-27: qty 3

## 2017-09-27 MED ORDER — SODIUM CHLORIDE 0.9% FLUSH
3.0000 mL | Freq: Two times a day (BID) | INTRAVENOUS | Status: DC
  Administered 2017-09-27 – 2017-09-28 (×3): 3 mL via INTRAVENOUS

## 2017-09-27 MED ORDER — ASPIRIN EC 81 MG PO TBEC
81.0000 mg | DELAYED_RELEASE_TABLET | Freq: Every day | ORAL | Status: DC
  Administered 2017-09-27 – 2017-10-01 (×5): 81 mg via ORAL
  Filled 2017-09-27 (×5): qty 1

## 2017-09-27 MED ORDER — THIAMINE HCL 100 MG/ML IJ SOLN
100.0000 mg | Freq: Every day | INTRAMUSCULAR | Status: DC
  Administered 2017-09-27 – 2017-09-28 (×2): 100 mg via INTRAVENOUS
  Filled 2017-09-27 (×2): qty 2

## 2017-09-27 MED ORDER — SODIUM CHLORIDE 0.9% FLUSH: 3.0000 mL | INTRAVENOUS | Status: DC | PRN

## 2017-09-27 MED ORDER — ALBUTEROL SULFATE (2.5 MG/3ML) 0.083% IN NEBU: 2.5000 mg | INHALATION_SOLUTION | RESPIRATORY_TRACT | Status: DC | PRN

## 2017-09-27 MED ORDER — VITAMIN B-1 100 MG PO TABS: 100.0000 mg | ORAL_TABLET | Freq: Every day | ORAL | Status: DC

## 2017-09-27 MED ORDER — ENOXAPARIN SODIUM 40 MG/0.4ML ~~LOC~~ SOLN
40.0000 mg | SUBCUTANEOUS | Status: DC
  Administered 2017-09-27 – 2017-09-30 (×3): 40 mg via SUBCUTANEOUS
  Filled 2017-09-27 (×4): qty 0.4

## 2017-09-27 MED ORDER — MAGNESIUM SULFATE 2 GM/50ML IV SOLN
2.0000 g | Freq: Once | INTRAVENOUS | Status: AC
  Administered 2017-09-27: 2 g via INTRAVENOUS
  Filled 2017-09-27: qty 50

## 2017-09-27 MED ORDER — FUROSEMIDE 10 MG/ML IJ SOLN
20.0000 mg | Freq: Once | INTRAMUSCULAR | Status: AC
  Administered 2017-09-27: 20 mg via INTRAVENOUS
  Filled 2017-09-27: qty 2

## 2017-09-27 MED ORDER — ONDANSETRON HCL 4 MG/2ML IJ SOLN: 4.0000 mg | Freq: Four times a day (QID) | INTRAMUSCULAR | Status: DC | PRN

## 2017-09-27 MED ORDER — METHYLPREDNISOLONE SODIUM SUCC 125 MG IJ SOLR
60.0000 mg | Freq: Two times a day (BID) | INTRAMUSCULAR | Status: AC
  Administered 2017-09-27 (×2): 60 mg via INTRAVENOUS
  Filled 2017-09-27 (×2): qty 2

## 2017-09-27 NOTE — Progress Notes (Signed)
  Echocardiogram 2D Echocardiogram has been performed.  Vassie Kugel T Jennier Schissler 09/27/2017, 2:49 PM

## 2017-09-27 NOTE — ED Notes (Signed)
Resp. Made aware

## 2017-09-27 NOTE — ED Provider Notes (Addendum)
[facility] EMERGENCY DEPARTMENT Provider Note   CSN: [id number] Arrival date & time: 09/27/17  9935     History   Chief Complaint Chief Complaint  Patient presents with  . Shortness of Breath    HPI Brian [name] is a 62 y.o. male.  He is homeless and heavy drinker of alcohol.  He was recently at [facility] for pneumonia.  He just got discharged yesterday AMA after being admitted for COPD exacerbation who was transiently on BiPAP.  He does not use oxygen at baseline.  He continues to smoke and last drink was last night.  He is complaining of increased shortness of breath and saying he should not of left the hospital.  He continues with chest pain and unclear when it started.  During his last admission he was treated with steroids nebulized treatments and Zithromax.  Per the notes he had an episode of V. tach overnight that resolved spontaneously.  Patient denies any fever. has Cough with green sputum.   The history is provided by the patient.  Shortness of Breath  This is a recurrent problem. The problem occurs frequently.The current episode started more than 2 days ago. The problem has not changed since onset.Associated symptoms include cough, sputum production and chest pain. Pertinent negatives include no fever, no coryza, no rhinorrhea, no sore throat, no neck pain, no hemoptysis, no syncope, no abdominal pain and no rash. It is unknown what precipitated the problem. Risk factors include smoking. He has tried nothing for the symptoms. He has had prior hospitalizations. He has had prior ED visits. Associated medical issues include COPD, pneumonia and heart failure.    Past Medical History:  Diagnosis Date  . Alcohol abuse    Heavy alcohol abuse and homelessness  . Arthritis   . CHF (congestive heart failure) (HCC)   . Cocaine abuse (HCC)    And crack   . COPD (chronic obstructive pulmonary disease) (HCC)   . Heart failure   . Homelessness   . Suicide attempt  (HCC)   . Tobacco abuse     Patient Active Problem List   Diagnosis Date Noted  . Chronic systolic CHF (congestive heart failure) (HCC) 09/25/2017  . Polysubstance abuse (HCC) 10/20/2015  . Acute respiratory failure with hypoxia and hypercarbia (HCC) 09/30/2015  . COPD with acute exacerbation (HCC) 09/30/2015  . Substance induced mood disorder (HCC) 02/04/2013  . Polysubstance (excluding opioids) dependence, daily use (HCC) 02/03/2013  . Alcohol dependence (HCC) 02/03/2013  . Gout attack 11/12/2012  . Closed fracture of 5th metacarpal 11/12/2012  . Chest pain at rest 08/27/2012  . COPD (chronic obstructive pulmonary disease) (HCC)   . Homelessness   . Cocaine abuse [facility])     Past Surgical History:  Procedure Laterality Date  . INNER EAR SURGERY          Home Medications    Prior to Admission medications   Medication Sig Start Date End Date Taking? Authorizing Provider  labetalol (NORMODYNE) 100 MG tablet Take 1 tablet (100 mg total) by mouth 2 (two) times daily. Patient not taking: Reported on 09/25/2017 10/22/15   Joseph Art, DO  lisinopril (PRINIVIL,ZESTRIL) 5 MG tablet Take 1 tablet (5 mg total) by mouth daily. Patient not taking: Reported on 09/25/2017 10/22/15   Joseph Art, DO    Family History Family History  Problem Relation Age of Onset  . Coronary artery disease Unknown   . Diabetes Unknown   . Cancer Sister  Social History Social History   Tobacco Use  . Smoking status: Current Every Day Smoker    Packs/day: 2.00    Types: Cigarettes  . Smokeless tobacco: Never Used  Substance Use Topics  . Alcohol use: Yes    Comment: Heavy. 5-10 40oz daily  . Drug use: No    Types: Marijuana, Cocaine    Comment: Marijuana and cocaine, crack     Allergies   Patient has no known allergies.   Review of Systems Review of Systems  Constitutional: Negative for fever.  HENT: Negative for rhinorrhea and sore throat.   Eyes: Negative for visual  disturbance.  Respiratory: Positive for cough, sputum production and shortness of breath. Negative for hemoptysis.   Cardiovascular: Positive for chest pain. Negative for syncope.  Gastrointestinal: Negative for abdominal pain.  Genitourinary: Negative for dysuria.  Musculoskeletal: Negative for neck pain.  Skin: Negative for rash.  Neurological: Negative for seizures.     Physical Exam Updated Vital Signs BP (!) 154/94   Pulse (!) 117   Temp 98.5 F (36.9 C)   Resp (!) 35   SpO2 91%   Physical Exam  Constitutional: He appears well-developed and well-nourished.  unkempt  HENT:  Head: Normocephalic and atraumatic.  Right Ear: External ear normal.  Left Ear: External ear normal.  Nose: Nose normal.  Mouth/Throat: Oropharynx is clear and moist.  Eyes: Conjunctivae and EOM are normal.  Neck: Neck supple.  Cardiovascular: Regular rhythm, normal heart sounds and intact distal pulses.  Pulmonary/Chest: Effort normal. Tachypnea noted. No respiratory distress. He has rhonchi (scattered).  Abdominal: Soft. He exhibits no mass. There is no tenderness. There is no guarding.  Musculoskeletal: Normal range of motion. He exhibits no tenderness or deformity.       Right lower leg: He exhibits edema. He exhibits no tenderness.       Left lower leg: He exhibits edema. He exhibits no tenderness.  Neurological: He is alert. He has normal strength. GCS eye subscore is 4. GCS verbal subscore is 5. GCS motor subscore is 6.  Skin: Skin is warm and dry. Capillary refill takes less than 2 seconds.  Psychiatric: He has a normal mood and affect.  Nursing note and vitals reviewed.    ED Treatments / Results  Labs (all labs ordered are listed, but only abnormal results are displayed) Labs Reviewed  BASIC METABOLIC PANEL  CBC WITH DIFFERENTIAL/PLATELET  BRAIN NATRIURETIC PEPTIDE  I-STAT VENOUS BLOOD GAS, ED    EKG EKG Interpretation  Date/Time:  Thursday September 27 2017 07:01:34  EDT Ventricular Rate:  118 PR Interval:    QRS Duration: 77 QT Interval:  315 QTC Calculation: 442 R Axis:   80 Text Interpretation:  Sinus arrhythmia Multiple ventricular premature complexes Probable left atrial enlargement Anterior infarct, old Borderline T abnormalities, inferior leads new pvcs from prior otherwise unchanged 6/19 Confirmed by Meridee Score [phone]) on 09/27/2017 7:05:23 AM   Radiology Dg Chest Port 1 View  Result Date: 09/27/2017 CLINICAL DATA:  Shortness of Breath EXAM: PORTABLE CHEST 1 VIEW COMPARISON:  09/25/2017 FINDINGS: Cardiac shadow is mildly enlarged but stable. Mild vascular congestion interstitial edema is noted. No sizable effusion is seen. No bony abnormality is noted. No focal infiltrate is noted. IMPRESSION: Mild CHF. Electronically Signed   By: Alcide Clever M.D.   On: 09/27/2017 08:01    Procedures .Critical Care Performed by: Terrilee Files, MD Authorized by: Terrilee Files, MD   Critical care provider statement:  Critical care time (minutes):  40   Critical care was necessary to treat or prevent imminent or life-threatening deterioration of the following conditions:  Respiratory failure and metabolic crisis   Critical care was time spent personally by me on the following activities:  Development of treatment plan with patient or surrogate, evaluation of patient's response to treatment, examination of patient, obtaining history from patient or surrogate, ordering and performing treatments and interventions, ordering and review of laboratory studies, ordering and review of radiographic studies, pulse oximetry, re-evaluation of patient's condition and review of old charts   I assumed direction of critical care for this patient from another provider in my specialty: no     (including critical care time)  Medications Ordered in ED Medications  magnesium sulfate IVPB 2 g 50 mL (has no administration in time range)  methylPREDNISolone sodium  succinate (SOLU-MEDROL) 125 mg/2 mL injection 125 mg (has no administration in time range)     Initial Impression / Assessment and Plan / ED Course  I have reviewed the triage vital signs and the nursing notes.  Pertinent labs & imaging results that were available during my care of the patient were reviewed by me and considered in my medical decision making (see chart for details).  Clinical Course as of Sep 29 706  Thu Sep 27, 2017  2923 62 year old male with history of COPD CHF who just left the hospital yesterday AMA after being admitted for increased shortness of breath returns with increased shortness of breath.  He has had some rhonchi on lung exams does not look overtly fluid overloaded.  He is complaining of some chest pain.  We are repeating lab work troponin chest x-ray and I have ordered him another dose of steroids IV along with magnesium and breathing treatments.  He will likely need to be readmitted back to the hospital again if he will accept treatment.   [MB]  [phone] Discussed with Dr. Ophelia Charter from the hospitalist service who will evaluate the patient in the ED for admission.   [MB]    Clinical Course User Index [MB] Terrilee Files, MD     Final Clinical Impressions(s) / ED Diagnoses   Final diagnoses:  Shortness of breath  COPD exacerbation (HCC)  Acute on chronic congestive heart failure, unspecified heart failure type (HCC)  Alcohol dependence with uncomplicated withdrawal [facility])    ED Discharge Orders    None       Terrilee Files, MD 09/28/17 0710    Terrilee Files, MD 10/11/17 [phone]

## 2017-09-27 NOTE — Progress Notes (Signed)
Patient requested to come off BIPAP upon arrival on the unit.  His repeat ABG was not markedly better.  He has remained off BIPAP and appears to be comfortable.  He did have an 11 beat run of vtach - but he also had vtach during his prior hospitalization.  Will continue to follow without further intervention for now.  Georgana Curio, M.D.

## 2017-09-27 NOTE — ED Triage Notes (Signed)
Pt homeless. CC of SOB. Labored breathing. Pt has lower extremity non-pitting edema. Pt oxygen sats. 88% RA, increased to 96% on 2L of [place].

## 2017-09-27 NOTE — Evaluation (Signed)
Physical Therapy Evaluation Patient Details Name: Brian [name] MRN: [id number] DOB: [birth year] Today's Date: 09/27/2017   History of Present Illness  62 y.o. male with medical history significant of homelessness, polysubstance abuse, COPD, and chronic systolic CHF presenting with subacute respiratory failure.Patient  Left because "I don/t know.  Really, just, I don'/;t know."  He reports drinking 1 beer, walked around and went back to his camp.  He noticed more trouble breathing this AM - "when I got up, I couldn't breathe period." Probably a combination of COPD, CHF, and some withdrawal - placed on CIWA.  Clinical Impression  Pt reluctant to participate in evaluation stating he had a bad headache. With maximal encouragement pt agreed to get up and walk to door and back. Pt independent with bed mobility and transfers and is supervision for limited ambulation. Although pt did not have any major instability with walking, he exhibits decreased safety awareness with the speed at which he walked and his decreased awareness of lines and leads attached to him. Pt probably does not need additional PT services, however PT will continue to follow acutely to assess longer distance ambulation.       Follow Up Recommendations No PT follow up    Equipment Recommendations  None recommended by PT    Recommendations for Other Services       Precautions / Restrictions Precautions Precautions: Fall Restrictions Weight Bearing Restrictions: No      Mobility  Bed Mobility Overal bed mobility: Independent                Transfers Overall transfer level: Independent                  Ambulation/Gait Ambulation/Gait assistance: Supervision Gait Distance (Feet): 20 Feet Assistive device: None Gait Pattern/deviations: Step-through pattern;Decreased stride length Gait velocity: too fast  Gait velocity interpretation: >4.37 ft/sec, indicative of normal walking speed General Gait  Details: supervision for safety as pt moves too fast for conditions, vc to slow down ignored      Balance Overall balance assessment: Mild deficits observed, not formally tested                                           Pertinent Vitals/Pain Pain Assessment: 0-10 Pain Score: 8  Pain Location: headache Pain Descriptors / Indicators: Headache Pain Intervention(s): Limited activity within patient's tolerance;Monitored during session    Home Living Family/patient expects to be discharged to:: Shelter/Homeless                      Prior Function Level of Independence: Independent                  Extremity/Trunk Assessment   Upper Extremity Assessment Upper Extremity Assessment: Overall WFL for tasks assessed    Lower Extremity Assessment Lower Extremity Assessment: Overall WFL for tasks assessed       Communication   Communication: No difficulties  Cognition Arousal/Alertness: Awake/alert Behavior During Therapy: WFL for tasks assessed/performed Overall Cognitive Status: History of cognitive impairments - at baseline                                 General Comments: pt has poor safety awareness and decreased ability to make good choices given his medical condition  General Comments General comments (skin integrity, edema, etc.): pt on 4 L O2 via nasal cannula, SaO2 throughout session 93%O2 or greater, VSS         Assessment/Plan    PT Assessment Patient needs continued PT services  PT Problem List Decreased safety awareness;Cardiopulmonary status limiting activity;Pain       PT Treatment Interventions Gait training;Stair training;Functional mobility training;Therapeutic activities;Therapeutic exercise;Balance training;Patient/family education    PT Goals (Current goals can be found in the Care Plan section)  Acute Rehab PT Goals Patient Stated Goal: order dinner  PT Goal Formulation: With patient Time For  Goal Achievement: 10/11/17 Potential to Achieve Goals: Fair    Frequency Min 3X/week    AM-PAC PT "6 Clicks" Daily Activity  Outcome Measure Difficulty turning over in bed (including adjusting bedclothes, sheets and blankets)?: None Difficulty moving from lying on back to sitting on the side of the bed? : None Difficulty sitting down on and standing up from a chair with arms (e.g., wheelchair, bedside commode, etc,.)?: None Help needed moving to and from a bed to chair (including a wheelchair)?: None Help needed walking in hospital room?: None Help needed climbing 3-5 steps with a railing? : None 6 Click Score: 24    End of Session Equipment Utilized During Treatment: Gait belt;Oxygen Activity Tolerance: Patient limited by pain(pt only willing to walk to door due to headache) Patient left: in bed;with call bell/phone within reach;with bed alarm set Nurse Communication: Mobility status PT Visit Diagnosis: Unsteadiness on feet (R26.81);Other abnormalities of gait and mobility (R26.89);Apraxia (R48.2);Pain Pain - part of body: (headache)    Time: 1457-1510 PT Time Calculation (min) (ACUTE ONLY): 13 min   Charges:   PT Evaluation $PT Eval Moderate Complexity: 1 Mod     PT G Codes:        Brian [name] Brian [name] PT, DPT Acute Rehabilitation  [phone] Pager [phone]    Brian [name] Fleet 09/27/2017, 3:25 PM

## 2017-09-27 NOTE — ED Notes (Signed)
Pt threatening to leave if not given water, informed that he is NPO at present, offered wet sponge stick but pt declined.

## 2017-09-27 NOTE — ED Notes (Signed)
Attempted to call report to floor 

## 2017-09-27 NOTE — H&P (Signed)
History and Physical    Brian [name]:[id number] DOB: 02/15/1956 DOA: 09/27/2017  PCP: Patient, [name] Consultants:  None Patient coming from: Homeless; NOK: Niece, [phone]  Chief Complaint: SOB  HPI: Brian [name] is a 62 y.o. male with medical history significant of homelessness, polysubstance abuse, COPD, and chronic systolic CHF presenting with subacute respiratory failure.Patient  Left because "I don/t know.  Really, just, I don'/;t know."  He reports drinking 1 beer, walked around and went back to his camp.  He noticed more trouble breathing this AM - "when I got up, I couldn't breathe period."  +tobacco.   Denies use of drugs.     ED Course:   Smokes, drinks heavily, is homeless.  Denies cocaine, not sure this is accurate.  Recent admission COPD, ?CHF.  Plan for echo and then he signed out AMA yesterday.  Went back and drank and smoke and changed his mind.  He is on 6L, about 90%, ETOH 0.  BNP increased from prior.  Probably a combination of COPD, CHF, and some withdrawal - placed on CIWA.    Review of Systems: As per HPI; otherwise review of systems reviewed and negative.   Ambulatory Status:  Ambulates without assistance  Past Medical History:  Diagnosis Date  . Alcohol abuse    Heavy alcohol abuse and homelessness  . Arthritis   . CHF (congestive heart failure) (HCC)   . Cocaine abuse (HCC)    And crack   . COPD (chronic obstructive pulmonary disease) (HCC)   . Heart failure   . Homelessness   . Suicide attempt (HCC)   . Tobacco abuse     Past Surgical History:  Procedure Laterality Date  . INNER EAR SURGERY      Social History   Socioeconomic History  . Marital status: Divorced    Spouse name: Not on file  . Number of children: Not on file  . Years of education: Not on file  . Highest education level: Not on file  Occupational History  . Occupation: unemployed  Social Needs  . Financial resource strain: Not on file  . Food insecurity:     Worry: Not on file    Inability: Not on file  . Transportation needs:    Medical: Not on file    Non-medical: Not on file  Tobacco Use  . Smoking status: Current Every Day Smoker    Packs/day: 2.00    Types: Cigarettes  . Smokeless tobacco: Never Used  Substance and Sexual Activity  . Alcohol use: Yes    Comment: Heavy. 5-10 40oz daily  . Drug use: No    Types: Marijuana, Cocaine    Comment: Marijuana and cocaine, crack  . Sexual activity: Not Currently  Lifestyle  . Physical activity:    Days per week: Not on file    Minutes per session: Not on file  . Stress: Not on file  Relationships  . Social connections:    Talks on phone: Not on file    Gets together: Not on file    Attends religious service: Not on file    Active member of club or organization: Not on file    Attends meetings of clubs or organizations: Not on file    Relationship status: Not on file  . Intimate partner violence:    Fear of current or ex partner: Not on file    Emotionally abused: Not on file    Physically abused: Not on file  Forced sexual activity: Not on file  Other Topics Concern  . Not on file  Social History Narrative   Currently homeless. Has poor social support, no family, just friends and police who check on him. Heavy alcohol noted in the past, currently states he drinks "1-2 beers a day" and last drink was earlier today. States he may get minor shakes and anxiety but denies frank EtOH withdrawal, seizures, DT's. States last cocaine and MJ abuse was last week. Denies IVDU     No Known Allergies  Family History  Problem Relation Age of Onset  . Coronary artery disease Unknown   . Diabetes Unknown   . Cancer Sister     Prior to Admission medications   Medication Sig Start Date End Date Taking? Authorizing Provider  ibuprofen (ADVIL,MOTRIN) 200 MG tablet Take 400 mg by mouth as needed for moderate pain.   Yes [provider]  labetalol (NORMODYNE) 100 MG tablet Take 1  tablet (100 mg total) by mouth 2 (two) times daily. Patient not taking: Reported on 09/25/2017 10/22/15   Joseph Art, DO  lisinopril (PRINIVIL,ZESTRIL) 5 MG tablet Take 1 tablet (5 mg total) by mouth daily. Patient not taking: Reported on 09/25/2017 10/22/15   Joseph Art, DO    Physical Exam: Vitals:   09/27/17 0945 09/27/17 1030 09/27/17 1041 09/27/17 1052  BP: (!) 149/86 (!) 143/88  (!) 155/95  Pulse: (!) 108 (!) 57  95  Resp:  (!) 27  (!) 24  Temp:      SpO2: 93% 91% 93% 95%     General:  Unkempt, malodorous, and ill-appearing; current RR while resting is 29 with O2 sat 86% on 6L O2 Eyes:  PERRL, EOMI, normal lids, iris ENT:  grossly normal hearing, lips & tongue, mmm Neck:  no LAD, masses or thyromegaly Cardiovascular: Tachycardia, no m/r/g. 2+ LE edema.  Respiratory: Diffuse rhonchorous breath sounds without significant wheezing.  Increased respiratory effort with persistent hypoxia. Abdomen:  soft, NT, ND, NABS Back:   normal alignment, no CVAT Skin: very diaphoretic Musculoskeletal:  grossly normal tone BUE/BLE, good ROM, no bony abnormality Psychiatric:  grossly normal mood and affect, speech fluent and appropriate, AOx3 Neurologic:  CN 2-12 grossly intact, moves all extremities in coordinated fashion, sensation intact    Radiological Exams on Admission: Dg Chest Port 1 View  Result Date: 09/27/2017 CLINICAL DATA:  Shortness of Breath EXAM: PORTABLE CHEST 1 VIEW COMPARISON:  09/25/2017 FINDINGS: Cardiac shadow is mildly enlarged but stable. Mild vascular congestion interstitial edema is noted. No sizable effusion is seen. No bony abnormality is noted. No focal infiltrate is noted. IMPRESSION: Mild CHF. Electronically Signed   By: Alcide Clever M.D.   On: 09/27/2017 08:01    EKG: Independently reviewed.  Sinus arrhythmia with rate 118; PVCs, nonspecific ST changes with no evidence of acute ischemia; NSCSLT   Labs on Admission: I have personally reviewed the  available labs and imaging studies at the time of the admission.  Pertinent labs:   ABG: 7.323/69.6/62.0 Troponin 0.05 BNP 1669.2; prior 1179.2 on 6/18 CO2 33 WBC 14.1   Assessment/Plan Principal Problem:   Acute respiratory failure with hypoxia and hypercarbia (HCC) Active Problems:   COPD (chronic obstructive pulmonary disease) (HCC)   Homelessness   Polysubstance (excluding opioids) dependence, daily use (HCC)   Alcohol dependence (HCC)   Chronic systolic CHF (congestive heart failure) (HCC)   Tobacco dependence   Acute on chronic respiratory failure in the setting of CHF exacerbation  with concomitant COPD -Patient left AMA yesterday after prior very similar admission -Primary source of respiratory failure appears to be CHF exacerbation, but is complicated by COPD and likely also polysubstance abuse with withdrawal  CHF exacerbation -Patient who left AMA before completing treatment yesterday presenting with worsening SOB and hypoxia  -He was quite tachpyneic, diaphoretic, and obviously SOB when I evaluated him and his pO2 was 62 on 6L [place] O2 -He has been started on BIPAP with plan for repeat ABG at 1130; if not improving, will call PCCM for evaluation -CXR consistent with pulmonary edema -Elevated BNP, moreso than on prior admission -Based on BNP and CXR, CHF exacerbation seems most likely reason for respiratory failure -Will admit to SDU -Will request echocardiogram - he left prior to having performed during prior admission.  EF in 7/17 was 25-30%. -Will start ASA -Will resume Lisinopril -Will resume Labetolol -CHF order set utilized; may need CHF team consult but will hold until Echo results are available -Was given Lasix 20 mg x 1 in ER and will repeat with 40 mg IV BID -Normal kidney function at this time, will follow -Repeat EKG in AM  COPD -He does have significant hypercarbia with h/o COPD and ongoing tobacco use at home -Will give standing Duonebs, prn  Albuterol, and start steroids -For now, there does not appear to be an indication for antibiotics -COPD order set was utilized with consults placed accordingly  Polysubstance abuse -Cessation encouraged; this should be encouraged on an ongoing basis -UDS ordered -Patient would benefit tremendously from a halfway house program -CIWA protocol ordered -SW consult requested  Tobacco dependence -Encourage cessation.  This was discussed with the patient and should be reviewed on an ongoing basis.   -Patch ordered at patient request.   DVT prophylaxis: Lovenox  Code Status:  FULL - confirmed with patient Family Communication: None present Disposition Plan:  To be determined Consults called: CM/SW/PT/OT/Nutrition/RT  Admission status: Admit - It is my clinical opinion that admission to INPATIENT is reasonable and necessary because this patient will require at least 2 midnights in the hospital to treat this condition based on the medical complexity of the problems presented.  Given the aforementioned information, the predictability of an adverse outcome is felt to be significant.   Jonah Blue MD [facility]  If note is complete, please contact covering daytime or nighttime physician. [email or web address] Password [email or web address]  09/27/2017, 11:02 AM

## 2017-09-27 NOTE — ED Notes (Signed)
SWAT RN and RT to transport

## 2017-09-28 DIAGNOSIS — J9601 Acute respiratory failure with hypoxia: Secondary | ICD-10-CM

## 2017-09-28 DIAGNOSIS — J9602 Acute respiratory failure with hypercapnia: Secondary | ICD-10-CM

## 2017-09-28 LAB — CBC WITH DIFFERENTIAL/PLATELET
Abs Immature Granulocytes: 0.1 10*3/uL (ref 0.0–0.1)
Basophils Absolute: 0 10*3/uL (ref 0.0–0.1)
Basophils Relative: 0 %
Eosinophils Absolute: 0 10*3/uL (ref 0.0–0.7)
Eosinophils Relative: 0 %
HEMATOCRIT: 54.5 % — AB (ref 39.0–52.0)
HEMOGLOBIN: 17.2 g/dL — AB (ref 13.0–17.0)
IMMATURE GRANULOCYTES: 1 %
LYMPHS ABS: 1.2 10*3/uL (ref 0.7–4.0)
LYMPHS PCT: 8 %
MCH: 30.6 pg (ref 26.0–34.0)
MCHC: 31.6 g/dL (ref 30.0–36.0)
MCV: 97 fL (ref 78.0–100.0)
Monocytes Absolute: 0.9 10*3/uL (ref 0.1–1.0)
Monocytes Relative: 6 %
NEUTROS PCT: 85 %
Neutro Abs: 12.3 10*3/uL — ABNORMAL HIGH (ref 1.7–7.7)
Platelets: 189 10*3/uL (ref 150–400)
RBC: 5.62 MIL/uL (ref 4.22–5.81)
RDW: 14.7 % (ref 11.5–15.5)
WBC: 14.5 10*3/uL — AB (ref 4.0–10.5)

## 2017-09-28 LAB — BASIC METABOLIC PANEL
ANION GAP: 9 (ref 5–15)
BUN: 25 mg/dL — ABNORMAL HIGH (ref 6–20)
CALCIUM: 9.2 mg/dL (ref 8.9–10.3)
CHLORIDE: 94 mmol/L — AB (ref 101–111)
CO2: 38 mmol/L — AB (ref 22–32)
Creatinine, Ser: 0.74 mg/dL (ref 0.61–1.24)
GFR calc non Af Amer: >60 mL/min (ref >60)
Glucose, Bld: 141 mg/dL — ABNORMAL HIGH (ref 65–99)
Potassium: 4.4 mmol/L (ref 3.5–5.1)
Sodium: 141 mmol/L (ref 135–145)

## 2017-09-28 MED ORDER — VITAMIN B-1 100 MG PO TABS
100.0000 mg | ORAL_TABLET | Freq: Every day | ORAL | Status: DC
  Administered 2017-09-29 – 2017-10-01 (×3): 100 mg via ORAL
  Filled 2017-09-28 (×3): qty 1

## 2017-09-28 MED ORDER — MAGNESIUM SULFATE 2 GM/50ML IV SOLN
2.0000 g | Freq: Once | INTRAVENOUS | Status: AC
  Administered 2017-09-28: 2 g via INTRAVENOUS
  Filled 2017-09-28: qty 50

## 2017-09-28 MED ORDER — ACETAMINOPHEN 325 MG PO TABS: 650.0000 mg | ORAL_TABLET | ORAL | Status: DC | PRN

## 2017-09-28 MED ORDER — LABETALOL HCL 100 MG PO TABS
100.0000 mg | ORAL_TABLET | Freq: Two times a day (BID) | ORAL | Status: DC
  Administered 2017-09-28 – 2017-10-01 (×6): 100 mg via ORAL
  Filled 2017-09-28 (×6): qty 1

## 2017-09-28 MED ORDER — FOLIC ACID 1 MG PO TABS
1.0000 mg | ORAL_TABLET | Freq: Every day | ORAL | Status: DC
Start: 2017-09-28 — End: 2017-10-01
  Administered 2017-09-29 – 2017-10-01 (×3): 1 mg via ORAL
  Filled 2017-09-28 (×3): qty 1

## 2017-09-28 NOTE — Progress Notes (Signed)
[facility] TEAM 1 - Stepdown/ICU TEAM  OMARR HANN  ZOX:[id number] DOB: 1955-04-25 DOA: 09/27/2017 PCP: Brian [name], [name]    Brief Narrative:  62 y.o. male with a hx of homelessness, polysubstance abuse, COPD, and chronic systolic CHF who presented with subacute respiratory failure. He left the hospital AMA the afternoon prior to this readmit: "I don/t know.  Really, just, I don't know."  He reported drinking beer and going back to his camp after leaving.  The next morning he awoke w/ severe recurrent SOB and returned to the hospital.    Significant Events: 6/20 admit 6/20 TTE - EF 30-35% - grade 2 DD  Subjective: Resting comfortably in bed at the time of visit.  Reports he feels much better.  Has been quite agitated with some of the staff and is already threatened to leave against advice multiple times when asked to do things that he does not wish to do, such as work with PT/OT.  Assessment & Plan:  Acute hypoxic resp failure on chronic hypercapnic resp failure  Due to combination of COPD + CHF - wean O2 as able - stabilizing   Acute COPD exacerbation Much improved at time of my visit - rapidly wean steroids and follow  Acute exacerbation of Chronic Systolic and Diastolic CHF  EF 81-19% w/ grade 2 DD via TTE 09/27/17 BNP 1700 at admit - this is likely the greatest contributor to his acute distress - diurese and follow - we will prove to be exceptionally hard to control given his noncompliance and homelessness  Filed Weights   09/27/17 1045 09/27/17 1242 09/28/17 0500  Weight: 85.9 kg (189 lb 6 oz) 85.9 kg (189 lb 6 oz) 85.9 kg (189 lb 6 oz)    NSVT Maximize electrolytes and follow on telemetry  Hypomagnesemia  Corrected to goal of 2.0 in setting of arrhythmia and cardiomyopathy  Polysubstance abuse - EtOH - Cocaine - Tobacco Brian [name] has been counseled as to the absolute need to avoid all of these substances  DVT prophylaxis: lovenox  Code Status: FULL CODE Family  Communication: no family present at time of exam  Disposition Plan: SDU  Consultants:  none  Antimicrobials:  none  Objective: Blood pressure (!) 136/92, pulse 99, temperature 98 F (36.7 C), temperature source Oral, resp. rate 16, height 5\' 7"  (1.702 m), weight 85.9 kg (189 lb 6 oz), SpO2 96 %.  Intake/Output Summary (Last 24 hours) at 09/28/2017 1013 Last data filed at 09/28/2017 0930 Gross per 24 hour  Intake 240 ml  Output 4375 ml  Net -4135 ml   Filed Weights   09/27/17 1045 09/27/17 1242 09/28/17 0500  Weight: 85.9 kg (189 lb 6 oz) 85.9 kg (189 lb 6 oz) 85.9 kg (189 lb 6 oz)    Examination: General: No acute respiratory distress at rest  Lungs: bibasilar crackles with mild expiratory wheezing Cardiovascular: Regular rate and rhythm without murmur - distant heart sounds Abdomen: Nontender, nondistended, soft, bowel sounds positive, no rebound, no ascites, no appreciable mass Extremities: 2+ bilateral lower extremity edema  CBC: Recent Labs  Lab 09/25/17 0051 09/27/17 0710 09/28/17 0808  WBC 8.8 14.1* 14.5*  NEUTROABS 5.3 11.1* 12.3*  HGB 15.9 15.4 17.2*  HCT 52.2* 50.5 54.5*  MCV 99.8 100.8* 97.0  PLT 240 207 189   Basic Metabolic Panel: Recent Labs  Lab 09/25/17 0051 09/25/17 1657 09/27/17 0710 09/28/17 0242  NA 144  --  143 141  K 4.7  --  4.2 4.4  CL 108  --  102 94*  CO2 28  --  33* 38*  GLUCOSE 115*  --  95 141*  BUN 15  --  22* 25*  CREATININE 0.88  --  0.90 0.74  CALCIUM 8.8*  --  9.0 9.2  MG  --  1.6*  --   --    GFR: Estimated Creatinine Clearance: 104.1 mL/min (by C-G formula based on SCr of 0.74 mg/dL).  Liver Function Tests: Recent Labs  Lab 09/25/17 0051  AST 39  ALT 61  ALKPHOS 50  BILITOT 0.4  PROT 6.6  ALBUMIN 3.6    Cardiac Enzymes: Recent Labs  Lab 09/25/17 0555 09/25/17 1101 09/25/17 1657  TROPONINI 0.03* 0.03* <0.03    Recent Results (from the past 240 hour(s))  MRSA PCR Screening     Status: None    Collection Time: 09/25/17  2:22 PM  Result Value Ref Range Status   MRSA by PCR NEGATIVE NEGATIVE Final    Comment:        The GeneXpert MRSA Assay (FDA approved for NASAL specimens only), is one component of a comprehensive MRSA colonization surveillance program. It is not intended to diagnose MRSA infection nor to guide or monitor treatment for MRSA infections. Performed at [facility], [address], [place], [place] [address]      Scheduled Meds: . aspirin EC  81 mg Oral Daily  . enoxaparin (LOVENOX) injection  40 mg Subcutaneous Q24H  . folic acid  1 mg Intravenous Daily  . furosemide  60 mg Intravenous Q12H  . labetalol  100 mg Oral BID  . lisinopril  5 mg Oral Daily  . nicotine  21 mg Transdermal Daily  . sodium chloride flush  3 mL Intravenous Q12H  . thiamine injection  100 mg Intravenous Daily     LOS: 1 day   Lonia Blood, MD [facility] Office  [phone] Pager - Text Page per Loretha Stapler as per below:  On-Call/Text Page:      [email or web address]      password [email or web address]  If 7PM-7AM, please contact night-coverage [email or web address] Password [facility] 09/28/2017, 10:13 AM

## 2017-09-28 NOTE — Progress Notes (Signed)
OT Cancellation Note  Patient Details Name: Brian [name] MRN: [id number] DOB: [birth year]   Cancelled Treatment:    Reason Eval/Treat Not Completed: Patient declined, no reason specified. Pt declined PT and OT with an agitated response to arrival of therapists.  MD has discontinued therapy based on pt request to not be bothered further with these services.    Margaretmary Eddy [facility] 09/28/2017, 10:51 AM

## 2017-09-28 NOTE — Progress Notes (Signed)
Nutrition Consult/Brief Note  RD consulted via COPD Gold Protocol.  Wt Readings from Last 15 Encounters:  09/28/17 189 lb 6 oz (85.9 kg)  09/26/17 194 lb 14.2 oz (88.4 kg)  10/20/15 200 lb 8 oz (90.9 kg)  10/07/15 194 lb 0.1 oz (88 kg)  04/28/15 280 lb (127 kg)  08/16/14 285 lb (129.3 kg)  02/02/13 200 lb (90.7 kg)  11/11/12 205 lb 14.6 oz (93.4 kg)  04/21/11 208 lb 5.4 oz (94.5 kg)  03/19/11 210 lb (95.3 kg)   Body mass index is 29.66 kg/m. Patient meets criteria for Overweight based on current BMI.   Current diet order is Regular, patient is consuming approximately 100% of meals at this time. Labs and medications reviewed.   No nutrition interventions warranted at this time. If nutrition issues arise, please consult RD.   Maureen Chatters, RD, LDN Pager #: [phone] After-Hours Pager #: [phone]

## 2017-09-28 NOTE — Care Management Note (Signed)
Case Management Note  Patient Details  Name: Brian [name] MRN: [id number] Date of Birth: 28-Nov-1955  Subjective/Objective:    [place] is [place] CSW [place], he will need follow up [facility] and will be able to get meds from the [facility],  He may need a Match letter at [employer] and a bus pass.    Patient left AMA on 6/19 and now back again.  He will need Match letter at dc if eligible.                  Action/Plan: Homeless, will need Match at [employer] and bus pass.   Expected Discharge Date:                  Expected Discharge Plan:  Homeless Shelter  In-House Referral:  Clinical Social Work  Discharge planning Services  CM Consult  Post Acute Care Choice:    Choice offered to:     DME Arranged:    DME Agency:     HH Arranged:    HH Agency:     Status of Service:  In process, will continue to follow  If discussed at Long Length of Stay Meetings, dates discussed:    Additional Comments:  Leone Haven, RN 09/28/2017, 5:50 PM

## 2017-09-28 NOTE — Progress Notes (Signed)
PT Cancellation Note  Patient Details Name: Brian [name] MRN: [id number] DOB: [birth year]   Cancelled Treatment:    Reason Eval/Treat Not Completed: Other (comment).  Pt declined PT and OT with an agitated response to arrival of therapists.  MD has discontinued therapy based on pt request to not be bothered further with these services.   Ivar Drape 09/28/2017, 10:40 AM   Samul Dada, PT MS Acute Rehab Dept. Number: [facility] [id number] and [facility] [phone]

## 2017-09-29 LAB — COMPREHENSIVE METABOLIC PANEL
ALT: 42 U/L (ref 17–63)
ANION GAP: 11 (ref 5–15)
AST: 23 U/L (ref 15–41)
Albumin: 3.3 g/dL — ABNORMAL LOW (ref 3.5–5.0)
Alkaline Phosphatase: 39 U/L (ref 38–126)
BUN: 25 mg/dL — ABNORMAL HIGH (ref 6–20)
CHLORIDE: 87 mmol/L — AB (ref 101–111)
CO2: 46 mmol/L — AB (ref 22–32)
CREATININE: 1.02 mg/dL (ref 0.61–1.24)
Calcium: 9.8 mg/dL (ref 8.9–10.3)
GFR calc Af Amer: >60 mL/min (ref >60)
Glucose, Bld: 100 mg/dL — ABNORMAL HIGH (ref 65–99)
Potassium: 4 mmol/L (ref 3.5–5.1)
SODIUM: 144 mmol/L (ref 135–145)
Total Bilirubin: 0.7 mg/dL (ref 0.3–1.2)
Total Protein: 5.8 g/dL — ABNORMAL LOW (ref 6.5–8.1)

## 2017-09-29 LAB — CBC
HCT: 56.9 % — ABNORMAL HIGH (ref 39.0–52.0)
HEMOGLOBIN: 17.5 g/dL — AB (ref 13.0–17.0)
MCH: 30.9 pg (ref 26.0–34.0)
MCHC: 30.8 g/dL (ref 30.0–36.0)
MCV: 100.5 fL — AB (ref 78.0–100.0)
PLATELETS: 195 10*3/uL (ref 150–400)
RBC: 5.66 MIL/uL (ref 4.22–5.81)
RDW: 14.6 % (ref 11.5–15.5)
WBC: 9.5 10*3/uL (ref 4.0–10.5)

## 2017-09-29 LAB — MAGNESIUM: MAGNESIUM: 1.9 mg/dL (ref 1.7–2.4)

## 2017-09-29 MED ORDER — ALBUTEROL SULFATE (2.5 MG/3ML) 0.083% IN NEBU: 2.5000 mg | INHALATION_SOLUTION | RESPIRATORY_TRACT | Status: DC | PRN

## 2017-09-29 MED ORDER — FUROSEMIDE 40 MG PO TABS
60.0000 mg | ORAL_TABLET | Freq: Two times a day (BID) | ORAL | Status: DC
  Administered 2017-09-30 (×2): 60 mg via ORAL
  Filled 2017-09-29 (×3): qty 1

## 2017-09-29 NOTE — Progress Notes (Signed)
[facility] TEAM 1 - Stepdown/ICU TEAM  Brian [name]  EKB:[id number] DOB: Feb 04, 1956 DOA: 09/27/2017 PCP: Patient, [name]    Brief Narrative:  62 y.o. male with a hx of homelessness, polysubstance abuse, COPD, and chronic systolic CHF who presented with subacute respiratory failure. He left the hospital AMA the afternoon prior to this readmit: "I don/t know. Really, just, I don't know."  He reported drinking beer and going back to his camp after leaving.  The next morning he awoke w/ severe recurrent SOB and returned to the hospital.    Significant Events: 6/20 admit 6/20 TTE - EF 30-35% - grade 2 DD  Subjective: The patient states he feels much better overall.  He asked that we remove his telemetry leads if at all possible.  He denies chest pain nausea vomiting or abdominal pain.  Assessment & Plan:  Acute hypoxic resp failure on chronic hypercapnic resp failure  Due to combination of COPD + CHF - wean O2 as able - continue diuresis   Acute COPD exacerbation Appears to resolved at this time - no wheezing on exam  Acute exacerbation of Chronic Systolic and Diastolic CHF  EF 93-11% w/ grade 2 DD via TTE 09/27/17 - BNP 1700 at admit - this is likely the greatest contributor to his acute distress - continue to diurese and follow - this will prove to be exceptionally hard to control given his noncompliance and homelessness - net negative ~7L since admit - appears we are approaching what will likely be his dry weight  Filed Weights   09/27/17 1242 09/28/17 0500 09/29/17 0500  Weight: 85.9 kg (189 lb 6 oz) 85.9 kg (189 lb 6 oz) 81.3 kg (179 lb 3.7 oz)    NSVT Lytes corrected - d/c tele at request of pt   Hypomagnesemia  Corrected to goal of 2.0 in setting of arrhythmia and cardiomyopathy - recheck in AM   Polysubstance abuse - EtOH - Cocaine - Tobacco Patient has been counseled as to the absolute need to avoid all of these substances - no evidence of withdrawal at this time    DVT prophylaxis: lovenox  Code Status: FULL CODE Family Communication: no family present at time of exam  Disposition Plan: transfer to med/surg bed - cont to diurese - probable d/c in 24-48hrs  Consultants:  none  Antimicrobials:  none  Objective: Blood pressure (!) 115/100, pulse 88, temperature 98.4 F (36.9 C), resp. rate (!) 21, height 5\' 7"  (1.702 m), weight 81.3 kg (179 lb 3.7 oz), SpO2 99 %.  Intake/Output Summary (Last 24 hours) at 09/29/2017 1637 Last data filed at 09/29/2017 1400 Gross per 24 hour  Intake 480 ml  Output 3200 ml  Net -2720 ml   Filed Weights   09/27/17 1242 09/28/17 0500 09/29/17 0500  Weight: 85.9 kg (189 lb 6 oz) 85.9 kg (189 lb 6 oz) 81.3 kg (179 lb 3.7 oz)    Examination: General: No acute respiratory distress - alert and communicative  Lungs: Clear to auscultation throughout with no active wheezing Cardiovascular: Regular rate and rhythm without murmur  Abdomen: NT/ND, soft, bs+, no mass  Extremities: trace B LE edema   CBC: Recent Labs  Lab 09/25/17 0051 09/27/17 0710 09/28/17 0808 09/29/17 0351  WBC 8.8 14.1* 14.5* 9.5  NEUTROABS 5.3 11.1* 12.3*  --   HGB 15.9 15.4 17.2* 17.5*  HCT 52.2* 50.5 54.5* 56.9*  MCV 99.8 100.8* 97.0 100.5*  PLT 240 207 189 195   Basic Metabolic  Panel: Recent Labs  Lab 09/25/17 1657 09/27/17 0710 09/28/17 0242 09/29/17 0351  NA  --  143 141 144  K  --  4.2 4.4 4.0  CL  --  102 94* 87*  CO2  --  33* 38* 46*  GLUCOSE  --  95 141* 100*  BUN  --  22* 25* 25*  CREATININE  --  0.90 0.74 1.02  CALCIUM  --  9.0 9.2 9.8  MG 1.6*  --   --  1.9   GFR: Estimated Creatinine Clearance: 79.6 mL/min (by C-G formula based on SCr of 1.02 mg/dL).  Liver Function Tests: Recent Labs  Lab 09/25/17 0051 09/29/17 0351  AST 39 23  ALT 61 42  ALKPHOS 50 39  BILITOT 0.4 0.7  PROT 6.6 5.8*  ALBUMIN 3.6 3.3*    Cardiac Enzymes: Recent Labs  Lab 09/25/17 0555 09/25/17 1101 09/25/17 1657  TROPONINI  0.03* 0.03* <0.03    Recent Results (from the past 240 hour(s))  MRSA PCR Screening     Status: None   Collection Time: 09/25/17  2:22 PM  Result Value Ref Range Status   MRSA by PCR NEGATIVE NEGATIVE Final    Comment:        The GeneXpert MRSA Assay (FDA approved for NASAL specimens only), is one component of a comprehensive MRSA colonization surveillance program. It is not intended to diagnose MRSA infection nor to guide or monitor treatment for MRSA infections. Performed at [facility], [address], [place], [place] [address]      Scheduled Meds: . aspirin EC  81 mg Oral Daily  . enoxaparin (LOVENOX) injection  40 mg Subcutaneous Q24H  . folic acid  1 mg Oral Daily  . furosemide  60 mg Intravenous Q12H  . labetalol  100 mg Oral BID  . lisinopril  5 mg Oral Daily  . nicotine  21 mg Transdermal Daily  . thiamine  100 mg Oral Daily     LOS: 2 days   Lonia Blood, MD [facility] Office  [phone] Pager - Text Page per Amion as per below:  On-Call/Text Page:      [email or web address]      password [email or web address]  If 7PM-7AM, please contact night-coverage [email or web address] Password [email or web address] 09/29/2017, 4:37 PM

## 2017-09-30 NOTE — Progress Notes (Signed)
[facility] TEAM 1 - Stepdown/ICU TEAM  Brian [name]  ZOX:[id number] DOB: 08/24/1955 DOA: 09/27/2017 PCP: Patient, [name]    Brief Narrative:  62 y.o. male with a hx of homelessness, polysubstance abuse, COPD, and chronic systolic CHF who presented with subacute respiratory failure. He left the hospital AMA the afternoon prior to this readmit: "I don/t know. Really, just, I don't know."  He reported drinking beer and going back to his camp after leaving.  The next morning he awoke w/ severe recurrent SOB and returned to the hospital.    Significant Events: 6/20 admit 6/20 TTE - EF 30-35% - grade 2 DD  Subjective: The patient has been telling his RN he intends to leave AMA today.  I spoke with him and he has agreed to stay until tomorrow.  He denies current chest pain shortness breath nausea or vomiting.  He continues to be on minimal O2 support and we will attempt to wean him to room air.  Assessment & Plan:  Acute hypoxic resp failure on chronic hypercapnic resp failure  Due to uncontrolled CHF + COPD - wean O2 as able - continue diuresis    Acute COPD exacerbation Acute exac now resolved   Acute exacerbation of Chronic Systolic and Diastolic CHF  EF 81-19% w/ grade 2 DD via TTE 09/27/17 - BNP 1700 at admit - this is likely the greatest contributor to his acute distress - continue to diurese and follow - this will prove to be exceptionally hard to control given his noncompliance and homelessness - net negative ~8L since admit - appears we are approaching what will likely be his dry weight - transitioned to oral lasix today   Filed Weights   09/28/17 0500 09/29/17 0500 09/30/17 0600  Weight: 85.9 kg (189 lb 6 oz) 81.3 kg (179 lb 3.7 oz) 89 kg (196 lb 3.4 oz)    NSVT Lytes corrected - d/c tele at request of pt   Hypomagnesemia  Corrected to goal of 2.0 in setting of arrhythmia and cardiomyopathy - recheck in AM (refused today)  Polysubstance abuse - EtOH - Cocaine -  Tobacco Patient has been counseled as to the absolute need to avoid all of these substances - no evidence of withdrawal at this time   DVT prophylaxis: lovenox  Code Status: FULL CODE Family Communication: no family present at time of exam  Disposition Plan: cont to diurese - d/c 6/24 after assistance w/ outpt meds and clinic appointment arranged   Consultants:  none  Antimicrobials:  none  Objective: Blood pressure 109/66, pulse 93, temperature 97.7 F (36.5 C), temperature source Oral, resp. rate 16, height 5\' 7"  (1.702 m), weight 89 kg (196 lb 3.4 oz), SpO2 93 %.  Intake/Output Summary (Last 24 hours) at 09/30/2017 1546 Last data filed at 09/30/2017 1127 Gross per 24 hour  Intake 240 ml  Output 1350 ml  Net -1110 ml   Filed Weights   09/28/17 0500 09/29/17 0500 09/30/17 0600  Weight: 85.9 kg (189 lb 6 oz) 81.3 kg (179 lb 3.7 oz) 89 kg (196 lb 3.4 oz)    Examination: General: No acute respiratory distress Lungs: CTA th/o - no wheezing  Cardiovascular: RRR Abdomen: NT/ND, soft, bs+, no mass  Extremities: no signif edema B LE   CBC: Recent Labs  Lab 09/25/17 0051 09/27/17 0710 09/28/17 0808 09/29/17 0351  WBC 8.8 14.1* 14.5* 9.5  NEUTROABS 5.3 11.1* 12.3*  --   HGB 15.9 15.4 17.2* 17.5*  HCT 52.2*  50.5 54.5* 56.9*  MCV 99.8 100.8* 97.0 100.5*  PLT 240 207 189 195   Basic Metabolic Panel: Recent Labs  Lab 09/25/17 1657 09/27/17 0710 09/28/17 0242 09/29/17 0351  NA  --  143 141 144  K  --  4.2 4.4 4.0  CL  --  102 94* 87*  CO2  --  33* 38* 46*  GLUCOSE  --  95 141* 100*  BUN  --  22* 25* 25*  CREATININE  --  0.90 0.74 1.02  CALCIUM  --  9.0 9.2 9.8  MG 1.6*  --   --  1.9   GFR: Estimated Creatinine Clearance: 83.1 mL/min (by C-G formula based on SCr of 1.02 mg/dL).  Liver Function Tests: Recent Labs  Lab 09/25/17 0051 09/29/17 0351  AST 39 23  ALT 61 42  ALKPHOS 50 39  BILITOT 0.4 0.7  PROT 6.6 5.8*  ALBUMIN 3.6 3.3*    Cardiac  Enzymes: Recent Labs  Lab 09/25/17 0555 09/25/17 1101 09/25/17 1657  TROPONINI 0.03* 0.03* <0.03    Recent Results (from the past 240 hour(s))  MRSA PCR Screening     Status: None   Collection Time: 09/25/17  2:22 PM  Result Value Ref Range Status   MRSA by PCR NEGATIVE NEGATIVE Final    Comment:        The GeneXpert MRSA Assay (FDA approved for NASAL specimens only), is one component of a comprehensive MRSA colonization surveillance program. It is not intended to diagnose MRSA infection nor to guide or monitor treatment for MRSA infections. Performed at [facility], [address] [place], [place] [address]      Scheduled Meds: . aspirin EC  81 mg Oral Daily  . enoxaparin (LOVENOX) injection  40 mg Subcutaneous Q24H  . folic acid  1 mg Oral Daily  . furosemide  60 mg Oral BID  . labetalol  100 mg Oral BID  . lisinopril  5 mg Oral Daily  . nicotine  21 mg Transdermal Daily  . thiamine  100 mg Oral Daily     LOS: 3 days   Lonia Blood, MD [facility] Office  [phone] Pager - Text Page per Amion as per below:  On-Call/Text Page:      [email or web address]      password [email or web address]  If 7PM-7AM, please contact night-coverage [email or web address] Password [facility] 09/30/2017, 3:46 PM

## 2017-10-01 LAB — COMPREHENSIVE METABOLIC PANEL
ALBUMIN: 3.6 g/dL (ref 3.5–5.0)
ALK PHOS: 42 U/L (ref 38–126)
ALT: 49 U/L (ref 17–63)
ANION GAP: 13 (ref 5–15)
AST: 31 U/L (ref 15–41)
BUN: 35 mg/dL — ABNORMAL HIGH (ref 6–20)
CALCIUM: 10.5 mg/dL — AB (ref 8.9–10.3)
CO2: 37 mmol/L — AB (ref 22–32)
Chloride: 91 mmol/L — ABNORMAL LOW (ref 101–111)
Creatinine, Ser: 0.94 mg/dL (ref 0.61–1.24)
GFR calc Af Amer: >60 mL/min (ref >60)
GFR calc non Af Amer: >60 mL/min (ref >60)
Glucose, Bld: 112 mg/dL — ABNORMAL HIGH (ref 65–99)
Potassium: 4.1 mmol/L (ref 3.5–5.1)
SODIUM: 141 mmol/L (ref 135–145)
Total Bilirubin: 0.6 mg/dL (ref 0.3–1.2)
Total Protein: 6.4 g/dL — ABNORMAL LOW (ref 6.5–8.1)

## 2017-10-01 LAB — CBC
HCT: 58 % — ABNORMAL HIGH (ref 39.0–52.0)
HEMOGLOBIN: 18.2 g/dL — AB (ref 13.0–17.0)
MCH: 30.6 pg (ref 26.0–34.0)
MCHC: 31.4 g/dL (ref 30.0–36.0)
MCV: 97.5 fL (ref 78.0–100.0)
Platelets: 201 10*3/uL (ref 150–400)
RBC: 5.95 MIL/uL — ABNORMAL HIGH (ref 4.22–5.81)
RDW: 14.7 % (ref 11.5–15.5)
WBC: 8 10*3/uL (ref 4.0–10.5)

## 2017-10-01 LAB — MAGNESIUM: MAGNESIUM: 2 mg/dL (ref 1.7–2.4)

## 2017-10-01 MED ORDER — LISINOPRIL 5 MG PO TABS: 5.0000 mg | ORAL_TABLET | Freq: Every day | ORAL | 0 refills | Status: DC

## 2017-10-01 MED ORDER — LABETALOL HCL 100 MG PO TABS: 100.0000 mg | ORAL_TABLET | Freq: Two times a day (BID) | ORAL | 0 refills | Status: DC

## 2017-10-01 MED ORDER — ASPIRIN 81 MG PO TBEC: 81.0000 mg | DELAYED_RELEASE_TABLET | Freq: Every day | ORAL | 0 refills | Status: DC

## 2017-10-01 MED ORDER — ALBUTEROL SULFATE HFA 108 (90 BASE) MCG/ACT IN AERS: 2.0000 | INHALATION_SPRAY | Freq: Four times a day (QID) | RESPIRATORY_TRACT | 0 refills | Status: DC | PRN

## 2017-10-01 MED ORDER — FUROSEMIDE 20 MG PO TABS: 60.0000 mg | ORAL_TABLET | Freq: Two times a day (BID) | ORAL | 0 refills | Status: DC

## 2017-10-01 NOTE — Progress Notes (Signed)
Patient given discharge instructions and verbalizes understanding. Patients IV removed and give a bus pass.

## 2017-10-01 NOTE — Care Management Note (Signed)
Case Management Note  Patient Details  Name: Brian [name] MRN: [id number] Date of Birth: 01/09/1956  Subjective/Objective:  Patient is [place] per CSW [place], he will need [facility] and will be able to get meds from the [facility], He may need a Match letter at [employer] and a bus pass. Patient left AMA on 6/19 and now back again.  He will need Match letter at dc if eligible.  6/24 Letha Cape RN, BSN- NCM gave patient Match Letter to ast with getting meds from [facility].                      Action/Plan: DC home when ready.  Expected Discharge Date:  10/01/17               Expected Discharge Plan:  Homeless Shelter  In-House Referral:  Clinical Social Work  Discharge planning Services  CM Consult, Indigent Health Clinic, [facility], Follow-up appt scheduled, Medication Assistance  Post Acute Care Choice:    Choice offered to:     DME Arranged:    DME Agency:     HH Arranged:    HH Agency:     Status of Service:  Completed, signed off  If discussed at [employer], dates discussed:    Additional Comments:  Leone Haven, RN 10/01/2017, 1:09 PM

## 2017-10-01 NOTE — Plan of Care (Signed)
Patient oxygenation remained intact throughout the shift. He maintained proper ventilation. Continued to monitor for patient safety.

## 2017-10-01 NOTE — Discharge Instructions (Signed)
Heart Failure °Heart failure means your heart has trouble pumping blood. This makes it hard for your body to work well. Heart failure is usually a long-term (chronic) condition. You must take good care of yourself and follow your doctor's treatment plan. °Follow these instructions at home: °· Take your heart medicine as told by your doctor. °? Do not stop taking medicine unless your doctor tells you to. °? Do not skip any dose of medicine. °? Refill your medicines before they run out. °? Take other medicines only as told by your doctor or pharmacist. °· Stay active if told by your doctor. The elderly and people with severe heart failure should talk with a doctor about physical activity. °· Eat heart-healthy foods. Choose foods that are without trans fat and are low in saturated fat, cholesterol, and salt (sodium). This includes fresh or frozen fruits and vegetables, fish, lean meats, fat-free or low-fat dairy foods, whole grains, and high-fiber foods. Lentils and dried peas and beans (legumes) are also good choices. °· Limit salt if told by your doctor. °· Cook in a healthy way. Roast, grill, broil, bake, poach, steam, or stir-fry foods. °· Limit fluids as told by your doctor. °· Weigh yourself every morning. Do this after you pee (urinate) and before you eat breakfast. Write down your weight to give to your doctor. °· Take your blood pressure and write it down if your doctor tells you to. °· Ask your doctor how to check your pulse. Check your pulse as told. °· Lose weight if told by your doctor. °· Stop smoking or chewing tobacco. Do not use gum or patches that help you quit without your doctor's approval. °· Schedule and go to doctor visits as told. °· Nonpregnant women should have no more than 1 drink a day. Men should have no more than 2 drinks a day. Talk to your doctor about drinking alcohol. °· Stop illegal drug use. °· Stay current with shots (immunizations). °· Manage your health conditions as told by your  doctor. °· Learn to manage your stress. °· Rest when you are tired. °· If it is really hot outside: °? Avoid intense activities. °? Use air conditioning or fans, or get in a cooler place. °? Avoid caffeine and alcohol. °? Wear loose-fitting, lightweight, and light-colored clothing. °· If it is really cold outside: °? Avoid intense activities. °? Layer your clothing. °? Wear mittens or gloves, a hat, and a scarf when going outside. °? Avoid alcohol. °· Learn about heart failure and get support as needed. °· Get help to maintain or improve your quality of life and your ability to care for yourself as needed. °Contact a doctor if: °· You gain weight quickly. °· You are more short of breath than usual. °· You cannot do your normal activities. °· You tire easily. °· You cough more than normal, especially with activity. °· You have any or more puffiness (swelling) in areas such as your hands, feet, ankles, or belly (abdomen). °· You cannot sleep because it is hard to breathe. °· You feel like your heart is beating fast (palpitations). °· You get dizzy or light-headed when you stand up. °Get help right away if: °· You have trouble breathing. °· There is a change in mental status, such as becoming less alert or not being able to focus. °· You have chest pain or discomfort. °· You faint. °This information is not intended to replace advice given to you by your health care provider. Make sure you   discuss any questions you have with your health care provider. °Document Released: 01/04/2008 Document Revised: 09/02/2015 Document Reviewed: 05/13/2012 °Elsevier Interactive Patient Education © 2017 Elsevier Inc. ° °

## 2017-10-01 NOTE — Discharge Summary (Signed)
DISCHARGE SUMMARY  Brian [name]  MR#: [id number]  DOB:[birth year]  Date of Admission: 09/27/2017 Date of Discharge: 10/01/2017  Attending Physician:Abhay Godbolt Silvestre Gunner, MD  Patient's DIX:[id number], [name]  Consults:  none  Disposition: D/C home   Follow-up Appts: Follow-up Information    [facility] Follow up on 10/23/2017.   Why:  9:30 for hospital follow up Contact information: [place] [address] [phone]       [facility] Follow up.   Why:  you can use the pharmacy here for medication assistance mon- fri.  Contact information: [address] [place] [address] [phone]          Tests Needing Follow-up: -routine monitoring of renal function and K+ of pt on ACEi and diuretic  -monitor weights/CHF management  -counsel on polysubstance abuse   Discharge Diagnoses: Acute hypoxic resp failure on chronic hypercapnic resp failure  Acute COPD exacerbation Acute exacerbation of Chronic Systolic and Diastolic CHF  NSVT Hypomagnesemia  Polysubstance abuse - EtOH - Cocaine - Tobacco  Initial presentation: 62 y.o.malewith a hx ofhomelessness, polysubstance abuse, COPD, and chronic systolic CHF who presented with subacute respiratory failure. He left the hospital AMA the afternoon prior to this readmit: "I don/t know. Really, just, I don't know." He reported drinking beer and going back to his camp after leaving.  The next morning he awoke w/ severe recurrent SOB and returned to the hospital.    Hospital Course:  Acute hypoxic resp failure on chronic hypercapnic resp failure  Due to uncontrolled CHF + COPD - continue diuresis after d/c - resp status stable at time of d/c    Acute COPD exacerbation Acute exac now resolved - no wheezing at time of d/c   Acute exacerbation of Chronic Systolic and Diastolic CHF  EF 83-77% w/  grade 2 DD via TTE 09/27/17 - BNP 1700 at admit - this is likely the greatest contributor to his acute distress - continue to diurese as an outpt - this will prove to be exceptionally hard to control given his noncompliance and homelessness - net negative ~9.4L since admit - appears his dry weight is ~81.4kg - transitioned to oral lasix prior to d/c - assistance w/ med acquisition provided by [facility]  Filed Weights   09/29/17 0500 09/30/17 0600 10/01/17 0433  Weight: 81.3 kg (179 lb 3.7 oz) 89 kg (196 lb 3.4 oz) 81.4 kg (179 lb 7.3 oz)    NSVT Lytes corrected - d/c tele at request of pt   Hypomagnesemia  Corrected to goal of 2.0 in setting of arrhythmia and cardiomyopathy - recheck confirmed stability at 2.0  Polysubstance abuse - EtOH - Cocaine - Tobacco Patient has been counseled as to the absolute need to avoid all of these substances - no evidence of withdrawal during this hospital stay     Allergies as of 10/01/2017   No Known Allergies     Medication List    STOP taking these medications   ibuprofen 200 MG tablet Commonly known as:  ADVIL,MOTRIN     TAKE these medications   albuterol 108 (90 Base) MCG/ACT inhaler Commonly known as:  PROVENTIL HFA;VENTOLIN HFA Inhale 2 puffs into the lungs every 6 (six) hours as needed for wheezing or shortness of breath.   aspirin 81 MG EC tablet Take 1 tablet (81 mg total) by mouth daily. Start taking on:  10/02/2017   furosemide 20 MG tablet  Commonly known as:  LASIX Take 3 tablets (60 mg total) by mouth 2 (two) times daily.   labetalol 100 MG tablet Commonly known as:  NORMODYNE Take 1 tablet (100 mg total) by mouth 2 (two) times daily.   lisinopril 5 MG tablet Commonly known as:  PRINIVIL,ZESTRIL Take 1 tablet (5 mg total) by mouth daily.       Day of Discharge BP (!) 115/94   Pulse 92   Temp 97.9 F (36.6 C) (Oral)   Resp 20   Ht 5\' 7"  (1.702 m)   Wt 81.4 kg (179 lb 7.3 oz)   SpO2 97%   BMI 28.11 kg/m   Physical  Exam: General: No acute respiratory distress Lungs: Clear to auscultation bilaterally without wheezes or crackles Cardiovascular: Regular rate and rhythm without murmur gallop or rub normal S1 and S2 Abdomen: Nontender, nondistended, soft, bowel sounds positive, no rebound, no ascites, no appreciable mass Extremities: No significant cyanosis, clubbing, or edema bilateral lower extremities  Basic Metabolic Panel: Recent Labs  Lab 09/25/17 0051 09/25/17 1657 09/27/17 0710 09/28/17 0242 09/29/17 0351 10/01/17 0405  NA 144  --  143 141 144 141  K 4.7  --  4.2 4.4 4.0 4.1  CL 108  --  102 94* 87* 91*  CO2 28  --  33* 38* 46* 37*  GLUCOSE 115*  --  95 141* 100* 112*  BUN 15  --  22* 25* 25* 35*  CREATININE 0.88  --  0.90 0.74 1.02 0.94  CALCIUM 8.8*  --  9.0 9.2 9.8 10.5*  MG  --  1.6*  --   --  1.9 2.0    Liver Function Tests: Recent Labs  Lab 09/25/17 0051 09/29/17 0351 10/01/17 0405  AST 39 23 31  ALT 61 42 49  ALKPHOS 50 39 42  BILITOT 0.4 0.7 0.6  PROT 6.6 5.8* 6.4*  ALBUMIN 3.6 3.3* 3.6    CBC: Recent Labs  Lab 09/25/17 0051 09/27/17 0710 09/28/17 0808 09/29/17 0351 10/01/17 0405  WBC 8.8 14.1* 14.5* 9.5 8.0  NEUTROABS 5.3 11.1* 12.3*  --   --   HGB 15.9 15.4 17.2* 17.5* 18.2*  HCT 52.2* 50.5 54.5* 56.9* 58.0*  MCV 99.8 100.8* 97.0 100.5* 97.5  PLT 240 207 189 195 201    Cardiac Enzymes: Recent Labs  Lab 09/25/17 0555 09/25/17 1101 09/25/17 1657  TROPONINI 0.03* 0.03* <0.03   BNP (last 3 results) Recent Labs    09/25/17 0051 09/27/17 0711  BNP 1,179.2* 3,244.0*     Recent Results (from the past 240 hour(s))  MRSA PCR Screening     Status: None   Collection Time: 09/25/17  2:22 PM  Result Value Ref Range Status   MRSA by PCR NEGATIVE NEGATIVE Final    Comment:        The GeneXpert MRSA Assay (FDA approved for NASAL specimens only), is one component of a comprehensive MRSA colonization surveillance program. It is not intended to  diagnose MRSA infection nor to guide or monitor treatment for MRSA infections. Performed at [facility], [address], [place], [place] [address]      Time spent in discharge (includes decision making & examination of pt): 35 minutes  10/01/2017, 12:02 PM   Lonia Blood, MD [facility] Office  [phone] Pager [phone]  On-Call/Text Page:      Brian [name]      password [facility]

## 2017-10-09 ENCOUNTER — Other Ambulatory Visit: Payer: Self-pay

## 2017-10-09 ENCOUNTER — Emergency Department (HOSPITAL_COMMUNITY): Payer: Medicaid Other

## 2017-10-09 ENCOUNTER — Emergency Department (HOSPITAL_COMMUNITY)
Admission: EM | Admit: 2017-10-09 | Discharge: 2017-10-09 | Disposition: A | Discharge status: Home / Self Care | Payer: Medicaid Other | Attending: Emergency Medicine | Admitting: Emergency Medicine

## 2017-10-09 DIAGNOSIS — J449 Chronic obstructive pulmonary disease, unspecified: Secondary | ICD-10-CM | POA: Insufficient documentation

## 2017-10-09 DIAGNOSIS — I5022 Chronic systolic (congestive) heart failure: Secondary | ICD-10-CM | POA: Diagnosis not present

## 2017-10-09 DIAGNOSIS — R0602 Shortness of breath: Secondary | ICD-10-CM | POA: Insufficient documentation

## 2017-10-09 DIAGNOSIS — Z79899 Other long term (current) drug therapy: Secondary | ICD-10-CM | POA: Diagnosis not present

## 2017-10-09 DIAGNOSIS — F17210 Nicotine dependence, cigarettes, uncomplicated: Secondary | ICD-10-CM | POA: Insufficient documentation

## 2017-10-09 DIAGNOSIS — M79602 Pain in left arm: Secondary | ICD-10-CM | POA: Insufficient documentation

## 2017-10-09 DIAGNOSIS — Z7982 Long term (current) use of aspirin: Secondary | ICD-10-CM | POA: Insufficient documentation

## 2017-10-09 LAB — COMPREHENSIVE METABOLIC PANEL
ALK PHOS: 39 U/L (ref 38–126)
ALT: 51 U/L — AB (ref 0–44)
AST: 47 U/L — ABNORMAL HIGH (ref 15–41)
Albumin: 3.3 g/dL — ABNORMAL LOW (ref 3.5–5.0)
Anion gap: 11 (ref 5–15)
BUN: 14 mg/dL (ref 6–20)
CALCIUM: 8.6 mg/dL — AB (ref 8.9–10.3)
CO2: 23 mmol/L (ref 22–32)
CREATININE: 0.83 mg/dL (ref 0.61–1.24)
Chloride: 107 mmol/L (ref 98–111)
Glucose, Bld: 103 mg/dL — ABNORMAL HIGH (ref 70–99)
Potassium: 4.8 mmol/L (ref 3.5–5.1)
SODIUM: 141 mmol/L (ref 135–145)
TOTAL PROTEIN: 6.2 g/dL — AB (ref 6.5–8.1)
Total Bilirubin: 1.5 mg/dL — ABNORMAL HIGH (ref 0.3–1.2)

## 2017-10-09 LAB — CBG MONITORING, ED: Glucose-Capillary: 90 mg/dL (ref 70–99)

## 2017-10-09 LAB — PROTIME-INR
INR: 0.97
PROTHROMBIN TIME: 12.8 s (ref 11.4–15.2)

## 2017-10-09 LAB — CBC WITH DIFFERENTIAL/PLATELET
ABS IMMATURE GRANULOCYTES: 0.1 10*3/uL (ref 0.0–0.1)
BASOS ABS: 0.1 10*3/uL (ref 0.0–0.1)
Basophils Relative: 1 %
Eosinophils Absolute: 0.1 10*3/uL (ref 0.0–0.7)
Eosinophils Relative: 1 %
HCT: 48.6 % (ref 39.0–52.0)
HEMOGLOBIN: 15.1 g/dL (ref 13.0–17.0)
Immature Granulocytes: 1 %
Lymphocytes Relative: 24 %
Lymphs Abs: 2.1 10*3/uL (ref 0.7–4.0)
MCH: 30.9 pg (ref 26.0–34.0)
MCHC: 31.1 g/dL (ref 30.0–36.0)
MCV: 99.4 fL (ref 78.0–100.0)
MONO ABS: 0.6 10*3/uL (ref 0.1–1.0)
MONOS PCT: 7 %
Neutro Abs: 6 10*3/uL (ref 1.7–7.7)
Neutrophils Relative %: 66 %
Platelets: 266 10*3/uL (ref 150–400)
RBC: 4.89 MIL/uL (ref 4.22–5.81)
RDW: 14.8 % (ref 11.5–15.5)
WBC: 8.9 10*3/uL (ref 4.0–10.5)

## 2017-10-09 LAB — I-STAT TROPONIN, ED: Troponin i, poc: 0 ng/mL (ref 0.00–0.08)

## 2017-10-09 LAB — TROPONIN I

## 2017-10-09 LAB — MAGNESIUM: MAGNESIUM: 1.9 mg/dL (ref 1.7–2.4)

## 2017-10-09 LAB — BRAIN NATRIURETIC PEPTIDE: B NATRIURETIC PEPTIDE 5: 416.2 pg/mL — AB (ref 0.0–100.0)

## 2017-10-09 MED ORDER — ALBUTEROL SULFATE (2.5 MG/3ML) 0.083% IN NEBU
5.0000 mg | INHALATION_SOLUTION | Freq: Once | RESPIRATORY_TRACT | Status: AC
  Administered 2017-10-09: 5 mg via RESPIRATORY_TRACT
  Filled 2017-10-09: qty 6

## 2017-10-09 MED ORDER — ASPIRIN 81 MG PO CHEW: 324.0000 mg | CHEWABLE_TABLET | Freq: Once | ORAL | Status: DC

## 2017-10-09 MED ORDER — METHYLPREDNISOLONE SODIUM SUCC 125 MG IJ SOLR
125.0000 mg | Freq: Once | INTRAMUSCULAR | Status: AC
  Administered 2017-10-09: 125 mg via INTRAVENOUS
  Filled 2017-10-09: qty 2

## 2017-10-09 MED ORDER — MORPHINE SULFATE (PF) 4 MG/ML IV SOLN
4.0000 mg | Freq: Once | INTRAVENOUS | Status: AC
  Administered 2017-10-09: 4 mg via INTRAVENOUS
  Filled 2017-10-09: qty 1

## 2017-10-09 MED ORDER — DICLOFENAC EPOLAMINE 1.3 % TD PTCH
1.0000 | MEDICATED_PATCH | Freq: Two times a day (BID) | TRANSDERMAL | Status: DC
  Administered 2017-10-09: 1 via TRANSDERMAL
  Filled 2017-10-09: qty 1

## 2017-10-09 MED ORDER — LIDO-CAPSAICIN-MEN-METHYL SAL 4-0.0375-5-20 % EX PTCH: 1.0000 | MEDICATED_PATCH | Freq: Two times a day (BID) | CUTANEOUS | 0 refills | Status: DC

## 2017-10-09 NOTE — ED Provider Notes (Signed)
[facility] EMERGENCY DEPARTMENT Provider Note   CSN: [id number] Arrival date & time: 10/09/17  1608     History   Chief Complaint Chief Complaint  Patient presents with  . Chest Pain    HPI Brian [name] is a 62 y.o. male.  HPI  Patient presents with concern of left arm pain. Patient has multiple medical issues including CHF, COPD, CAD. He notes that he always has some breathing difficulty, continues to smoke cigarettes. However, he has not had similar pain to that which she is currently experiencing, and which brought him here for evaluation. About 3 hours ago he developed pain throughout the left arm, from the left paraspinal cervical region throughout the distal extremity. No other new chest pain, no syncope, no nausea, vomiting, fever, chills. No medication taken for pain relief.   Past Medical History:  Diagnosis Date  . Alcohol abuse    Heavy alcohol abuse and homelessness  . Arthritis   . CHF (congestive heart failure) (HCC)   . Cocaine abuse (HCC)    And crack   . COPD (chronic obstructive pulmonary disease) (HCC)   . Heart failure   . Homelessness   . Suicide attempt (HCC)   . Tobacco abuse     Patient Active Problem List   Diagnosis Date Noted  . Tobacco dependence 09/27/2017  . Chronic systolic CHF (congestive heart failure) (HCC) 09/25/2017  . COPD with acute exacerbation (HCC) 09/30/2015  . Substance induced mood disorder (HCC) 02/04/2013  . Polysubstance (excluding opioids) dependence, daily use (HCC) 02/03/2013  . Alcohol dependence (HCC) 02/03/2013  . Gout attack 11/12/2012  . Closed fracture of 5th metacarpal 11/12/2012  . Chest pain at rest 08/27/2012  . COPD (chronic obstructive pulmonary disease) (HCC)   . Homelessness   . Cocaine abuse [facility])     Past Surgical History:  Procedure Laterality Date  . INNER EAR SURGERY          Home Medications    Prior to Admission medications   Medication Sig Start Date  End Date Taking? Authorizing Provider  albuterol (PROVENTIL HFA;VENTOLIN HFA) 108 (90 Base) MCG/ACT inhaler Inhale 2 puffs into the lungs every 6 (six) hours as needed for wheezing or shortness of breath. 10/01/17  Yes Lonia Blood, MD  aspirin EC 81 MG EC tablet Take 1 tablet (81 mg total) by mouth daily. 10/02/17  Yes Lonia Blood, MD  furosemide (LASIX) 20 MG tablet Take 3 tablets (60 mg total) by mouth 2 (two) times daily. 10/01/17  Yes Lonia Blood, MD  labetalol (NORMODYNE) 100 MG tablet Take 1 tablet (100 mg total) by mouth 2 (two) times daily. 10/01/17  Yes Lonia Blood, MD  lisinopril (PRINIVIL,ZESTRIL) 5 MG tablet Take 1 tablet (5 mg total) by mouth daily. 10/01/17  Yes Lonia Blood, MD    Family History Family History  Problem Relation Age of Onset  . Coronary artery disease Unknown   . Diabetes Unknown   . Cancer Sister     Social History Social History   Tobacco Use  . Smoking status: Current Every Day Smoker    Packs/day: 2.00    Types: Cigarettes  . Smokeless tobacco: Never Used  Substance Use Topics  . Alcohol use: Yes    Comment: Heavy. 5-10 40oz daily  . Drug use: No    Types: Marijuana, Cocaine    Comment: Marijuana and cocaine, crack     Allergies   Patient has no known  allergies.   Review of Systems Review of Systems  Constitutional:       Per HPI, otherwise negative  HENT:       Per HPI, otherwise negative  Respiratory:       Per HPI, otherwise negative  Cardiovascular:       Per HPI, otherwise negative  Gastrointestinal: Negative for vomiting.  Endocrine:       Negative aside from HPI  Genitourinary:       Neg aside from HPI   Musculoskeletal:       Per HPI, otherwise negative  Skin: Negative.   Neurological: Negative for syncope.     Physical Exam Updated Vital Signs BP 130/80   Pulse 91   Resp (!) 26   SpO2 93%   Physical Exam  Constitutional: He is oriented to person, place, and time. He appears  well-developed. No distress.  HENT:  Head: Normocephalic and atraumatic.  Eyes: Conjunctivae and EOM are normal.  Neck:  Tender to palpation left paraspinal no deformity, no crepitus, tenderness extends throughout the left superior trapezius  Cardiovascular: Normal rate and regular rhythm.  Pulmonary/Chest: He has decreased breath sounds. He has wheezes.  Abdominal: He exhibits no distension.  Musculoskeletal: He exhibits no edema.  Neurological: He is alert and oriented to person, place, and time.  Skin: Skin is warm and dry.  Psychiatric: He has a normal mood and affect.  Nursing note and vitals reviewed.    ED Treatments / Results  Labs (all labs ordered are listed, but only abnormal results are displayed) Labs Reviewed  COMPREHENSIVE METABOLIC PANEL - Abnormal; Notable for the following components:      Result Value   Glucose, Bld 103 (*)    Calcium 8.6 (*)    Total Protein 6.2 (*)    Albumin 3.3 (*)    AST 47 (*)    ALT 51 (*)    Total Bilirubin 1.5 (*)    All other components within normal limits  BRAIN NATRIURETIC PEPTIDE - Abnormal; Notable for the following components:   B Natriuretic Peptide 416.2 (*)    All other components within normal limits  MAGNESIUM  TROPONIN I  CBC WITH DIFFERENTIAL/PLATELET  PROTIME-INR  CBG MONITORING, ED  I-STAT TROPONIN, ED    EKG EKG Interpretation  Date/Time:  Tuesday October 09 2017 16:26:08 EDT Ventricular Rate:  105 PR Interval:    QRS Duration: 88 QT Interval:  331 QTC Calculation: 438 R Axis:   61 Text Interpretation:  Sinus tachycardia Probable left atrial enlargement Anterior infarct, old Artifact Abnormal ekg Confirmed by Gerhard Munch [phone]) on 10/09/2017 5:29:51 PM   Radiology Dg Chest Portable 1 View  Result Date: 10/09/2017 CLINICAL DATA:  Left upper arm and left shoulder pain, some numbness in the left hand, shortness of breath, alcohol and drug abuse EXAM: PORTABLE CHEST 1 VIEW COMPARISON:  Portable chest  x-ray of 09/27/2016 FINDINGS: Minimally prominent markings remain particular at the left lung base most consistent with atelectasis and possible scarring. No definite pneumonia or effusion is seen. Mediastinal and hilar contours are unremarkable and the heart is mildly enlarged and stable. No acute bony abnormality is seen. IMPRESSION: 1. No active process. 2. Mild left basilar linear atelectasis or scarring. 3. Stable mild cardiomegaly. Electronically Signed   By: Dwyane Dee M.D.   On: 10/09/2017 16:41    Procedures Procedures (including critical care time)  Medications Ordered in ED Medications  aspirin chewable tablet 324 mg (324 mg Oral Not Given  10/09/17 1625)  diclofenac (FLECTOR) 1.3 % 1 patch (1 patch Transdermal Patch Applied 10/09/17 1725)  morphine 4 MG/ML injection 4 mg (4 mg Intravenous Given 10/09/17 1700)  albuterol (PROVENTIL) (2.5 MG/3ML) 0.083% nebulizer solution 5 mg (5 mg Nebulization Given 10/09/17 1658)  methylPREDNISolone sodium succinate (SOLU-MEDROL) 125 mg/2 mL injection 125 mg (125 mg Intravenous Given 10/09/17 1700)     Initial Impression / Assessment and Plan / ED Course  I have reviewed the triage vital signs and the nursing notes.  Pertinent labs & imaging results that were available during my care of the patient were reviewed by me and considered in my medical decision making (see chart for details).     After the initial eval he received solumedrol, diclofenac patch and morphine, and albuterol.    8:26 PM Patient sleeping. Second troponin unremarkable, is calm, in no distress, and when he was awake denied worsening of pain, nor is new symptoms. This patient with COPD, multiple medical issues presents with arm pain, consistent with radiculopathy given the reproducibility, and with some respiratory difficulty, though no evidence for pneumonia no respiratory compromise per Patient continues to smoke cigarettes, drink alcohol. Patient's evaluation are generally  reassuring, consistent with prior studies, and given his improvement, patient was discharged in stable condition.  Final Clinical Impressions(s) / ED Diagnoses  Left arm pain Shortness of breath   Gerhard Munch, MD 10/09/17 2026

## 2017-10-09 NOTE — Discharge Instructions (Addendum)
As discussed, your evaluation today has been largely reassuring.  But, it is important that you monitor your condition carefully, and do not hesitate to return to the ED if you develop new, or concerning changes in your condition. ? ?Otherwise, please follow-up with your physician for appropriate ongoing care. ? ?

## 2017-10-09 NOTE — ED Notes (Signed)
Discharge instructions and prescriptions discussed with Pt. Pt verbalized understanding. Pt stable and ambulatory.   

## 2017-10-09 NOTE — ED Triage Notes (Signed)
Patient arrived to ED via [facility]. EMS reports:  Patient c/o central chest pain radiating to L arm beginning approx 1100. Denies shortness of breath, nausea, vomiting.  EMS administered ASA 324, NTG x 1 - no relief. Rates chest pain 10/10. Hx COPD, daily smoker. BP 108/72, Pulse 106, 91% on room air/93-94% on 2 LPM. NSR on monitor.

## 2017-10-14 ENCOUNTER — Observation Stay (HOSPITAL_COMMUNITY)
Admission: EM | Admit: 2017-10-14 | Discharge: 2017-10-16 | Discharge status: Against Medical Advice | Payer: Medicaid Other | Attending: Internal Medicine | Admitting: Internal Medicine

## 2017-10-14 ENCOUNTER — Emergency Department (HOSPITAL_COMMUNITY): Payer: Medicaid Other

## 2017-10-14 ENCOUNTER — Encounter (HOSPITAL_COMMUNITY): Payer: Self-pay | Admitting: Emergency Medicine

## 2017-10-14 DIAGNOSIS — M79641 Pain in right hand: Secondary | ICD-10-CM

## 2017-10-14 DIAGNOSIS — R000 Tachycardia, unspecified: Secondary | ICD-10-CM | POA: Insufficient documentation

## 2017-10-14 DIAGNOSIS — J410 Simple chronic bronchitis: Secondary | ICD-10-CM

## 2017-10-14 DIAGNOSIS — R079 Chest pain, unspecified: Principal | ICD-10-CM | POA: Diagnosis present

## 2017-10-14 DIAGNOSIS — I5042 Chronic combined systolic (congestive) and diastolic (congestive) heart failure: Secondary | ICD-10-CM | POA: Diagnosis not present

## 2017-10-14 DIAGNOSIS — Z590 Homelessness: Secondary | ICD-10-CM | POA: Diagnosis not present

## 2017-10-14 DIAGNOSIS — F1420 Cocaine dependence, uncomplicated: Secondary | ICD-10-CM | POA: Diagnosis not present

## 2017-10-14 DIAGNOSIS — M1990 Unspecified osteoarthritis, unspecified site: Secondary | ICD-10-CM | POA: Insufficient documentation

## 2017-10-14 DIAGNOSIS — Z915 Personal history of self-harm: Secondary | ICD-10-CM | POA: Insufficient documentation

## 2017-10-14 DIAGNOSIS — F1020 Alcohol dependence, uncomplicated: Secondary | ICD-10-CM | POA: Diagnosis present

## 2017-10-14 DIAGNOSIS — I5022 Chronic systolic (congestive) heart failure: Secondary | ICD-10-CM | POA: Diagnosis present

## 2017-10-14 DIAGNOSIS — F1920 Other psychoactive substance dependence, uncomplicated: Secondary | ICD-10-CM | POA: Diagnosis present

## 2017-10-14 DIAGNOSIS — R7989 Other specified abnormal findings of blood chemistry: Secondary | ICD-10-CM | POA: Diagnosis not present

## 2017-10-14 DIAGNOSIS — F17210 Nicotine dependence, cigarettes, uncomplicated: Secondary | ICD-10-CM | POA: Diagnosis not present

## 2017-10-14 DIAGNOSIS — J449 Chronic obstructive pulmonary disease, unspecified: Secondary | ICD-10-CM | POA: Insufficient documentation

## 2017-10-14 DIAGNOSIS — F1410 Cocaine abuse, uncomplicated: Secondary | ICD-10-CM

## 2017-10-14 DIAGNOSIS — J441 Chronic obstructive pulmonary disease with (acute) exacerbation: Secondary | ICD-10-CM | POA: Diagnosis present

## 2017-10-14 DIAGNOSIS — J439 Emphysema, unspecified: Secondary | ICD-10-CM

## 2017-10-14 DIAGNOSIS — F10239 Alcohol dependence with withdrawal, unspecified: Secondary | ICD-10-CM

## 2017-10-14 DIAGNOSIS — Z7982 Long term (current) use of aspirin: Secondary | ICD-10-CM | POA: Diagnosis not present

## 2017-10-14 LAB — TROPONIN I: TROPONIN I: 0.03 ng/mL — AB (ref <0.03)

## 2017-10-14 LAB — BASIC METABOLIC PANEL
Anion gap: 12 (ref 5–15)
BUN: 15 mg/dL (ref 6–20)
CALCIUM: 8.9 mg/dL (ref 8.9–10.3)
CO2: 23 mmol/L (ref 22–32)
CREATININE: 0.87 mg/dL (ref 0.61–1.24)
Chloride: 106 mmol/L (ref 98–111)
GFR calc Af Amer: >60 mL/min (ref >60)
GLUCOSE: 92 mg/dL (ref 70–99)
Potassium: 4.4 mmol/L (ref 3.5–5.1)
Sodium: 141 mmol/L (ref 135–145)

## 2017-10-14 LAB — CBC
HCT: 50.2 % (ref 39.0–52.0)
Hemoglobin: 15.9 g/dL (ref 13.0–17.0)
MCH: 30.6 pg (ref 26.0–34.0)
MCHC: 31.7 g/dL (ref 30.0–36.0)
MCV: 96.7 fL (ref 78.0–100.0)
Platelets: 222 10*3/uL (ref 150–400)
RBC: 5.19 MIL/uL (ref 4.22–5.81)
RDW: 14.5 % (ref 11.5–15.5)
WBC: 8.1 10*3/uL (ref 4.0–10.5)

## 2017-10-14 LAB — I-STAT TROPONIN, ED: TROPONIN I, POC: 0.01 ng/mL (ref 0.00–0.08)

## 2017-10-14 LAB — BRAIN NATRIURETIC PEPTIDE: B Natriuretic Peptide: 577.3 pg/mL — ABNORMAL HIGH (ref 0.0–100.0)

## 2017-10-14 LAB — ETHANOL: ALCOHOL ETHYL (B): 218 mg/dL — AB (ref <10)

## 2017-10-14 MED ORDER — LORAZEPAM 1 MG PO TABS
1.0000 mg | ORAL_TABLET | Freq: Four times a day (QID) | ORAL | Status: DC | PRN
  Administered 2017-10-15 – 2017-10-16 (×2): 1 mg via ORAL
  Filled 2017-10-14 (×2): qty 1

## 2017-10-14 MED ORDER — LORAZEPAM 2 MG/ML IJ SOLN
1.0000 mg | Freq: Once | INTRAMUSCULAR | Status: AC
  Administered 2017-10-14: 1 mg via INTRAVENOUS
  Filled 2017-10-14: qty 1

## 2017-10-14 MED ORDER — FUROSEMIDE 10 MG/ML IJ SOLN
60.0000 mg | Freq: Once | INTRAMUSCULAR | Status: AC
  Administered 2017-10-14: 60 mg via INTRAVENOUS
  Filled 2017-10-14: qty 6

## 2017-10-14 MED ORDER — SODIUM CHLORIDE 0.9% FLUSH: 3.0000 mL | INTRAVENOUS | Status: DC | PRN

## 2017-10-14 MED ORDER — SODIUM CHLORIDE 0.9% FLUSH
3.0000 mL | Freq: Two times a day (BID) | INTRAVENOUS | Status: DC
Start: 2017-10-14 — End: 2017-10-16
  Administered 2017-10-15 – 2017-10-16 (×3): 3 mL via INTRAVENOUS

## 2017-10-14 MED ORDER — LISINOPRIL 5 MG PO TABS: 5.0000 mg | ORAL_TABLET | Freq: Every day | ORAL | Status: DC

## 2017-10-14 MED ORDER — LORAZEPAM 2 MG/ML IJ SOLN: 1.0000 mg | Freq: Four times a day (QID) | INTRAMUSCULAR | Status: DC | PRN

## 2017-10-14 MED ORDER — SODIUM CHLORIDE 0.9 % IV SOLN: 250.0000 mL | INTRAVENOUS | Status: DC | PRN

## 2017-10-14 MED ORDER — NITROGLYCERIN 0.4 MG SL SUBL: 0.4000 mg | SUBLINGUAL_TABLET | SUBLINGUAL | Status: DC | PRN

## 2017-10-14 MED ORDER — ASPIRIN EC 81 MG PO TBEC
81.0000 mg | DELAYED_RELEASE_TABLET | Freq: Every day | ORAL | Status: DC
  Administered 2017-10-15 – 2017-10-16 (×2): 81 mg via ORAL
  Filled 2017-10-14 (×2): qty 1

## 2017-10-14 MED ORDER — ONDANSETRON HCL 4 MG/2ML IJ SOLN: 4.0000 mg | Freq: Four times a day (QID) | INTRAMUSCULAR | Status: DC | PRN

## 2017-10-14 MED ORDER — FOLIC ACID 1 MG PO TABS
1.0000 mg | ORAL_TABLET | Freq: Every day | ORAL | Status: DC
  Administered 2017-10-15 – 2017-10-16 (×2): 1 mg via ORAL
  Filled 2017-10-14 (×2): qty 1

## 2017-10-14 MED ORDER — ENOXAPARIN SODIUM 40 MG/0.4ML ~~LOC~~ SOLN
40.0000 mg | SUBCUTANEOUS | Status: DC
  Administered 2017-10-15 – 2017-10-16 (×2): 40 mg via SUBCUTANEOUS
  Filled 2017-10-14 (×2): qty 0.4

## 2017-10-14 MED ORDER — THIAMINE HCL 100 MG/ML IJ SOLN: 100.0000 mg | Freq: Every day | INTRAMUSCULAR | Status: DC

## 2017-10-14 MED ORDER — LORAZEPAM 1 MG PO TABS: 0.0000 mg | ORAL_TABLET | Freq: Two times a day (BID) | ORAL | Status: DC

## 2017-10-14 MED ORDER — ACETAMINOPHEN 325 MG PO TABS
650.0000 mg | ORAL_TABLET | ORAL | Status: DC | PRN
  Administered 2017-10-15: 650 mg via ORAL
  Filled 2017-10-14: qty 2

## 2017-10-14 MED ORDER — FUROSEMIDE 40 MG PO TABS
60.0000 mg | ORAL_TABLET | Freq: Two times a day (BID) | ORAL | Status: DC
  Administered 2017-10-15 – 2017-10-16 (×3): 60 mg via ORAL
  Filled 2017-10-14 (×2): qty 1

## 2017-10-14 MED ORDER — LORAZEPAM 1 MG PO TABS
0.0000 mg | ORAL_TABLET | Freq: Four times a day (QID) | ORAL | Status: DC
  Administered 2017-10-15 – 2017-10-16 (×3): 1 mg via ORAL
  Filled 2017-10-14 (×4): qty 1

## 2017-10-14 MED ORDER — ADULT MULTIVITAMIN W/MINERALS CH
1.0000 | ORAL_TABLET | Freq: Every day | ORAL | Status: DC
  Administered 2017-10-15 – 2017-10-16 (×2): 1 via ORAL
  Filled 2017-10-14 (×2): qty 1

## 2017-10-14 MED ORDER — VITAMIN B-1 100 MG PO TABS
100.0000 mg | ORAL_TABLET | Freq: Every day | ORAL | Status: DC
  Administered 2017-10-15 – 2017-10-16 (×2): 100 mg via ORAL
  Filled 2017-10-14 (×2): qty 1

## 2017-10-14 MED ORDER — ALBUTEROL SULFATE (2.5 MG/3ML) 0.083% IN NEBU
2.5000 mg | INHALATION_SOLUTION | RESPIRATORY_TRACT | Status: DC | PRN
Start: 2017-10-14 — End: 2017-10-16

## 2017-10-14 NOTE — ED Triage Notes (Signed)
Per EMS pt homeless c/o gradual onset L sided chest pain and L arm numbness. No N/V, h/s of COPD. C/o SOB. 2 nitro given, 324 mg aspirin in route.

## 2017-10-14 NOTE — ED Provider Notes (Signed)
[facility] EMERGENCY DEPARTMENT Provider Note   CSN: [id number] Arrival date & time: 10/14/17  1556     History   Chief Complaint Chief Complaint  Patient presents with  . Chest Pain    HPI Brian [name] is a 62 y.o. male.  62 year old male with history of CAD and CHF who presents with over 24 hours history of chronic sharp left-sided chest discomfort as well as left arm discomfort as well.  Symptoms became worse today when he was walking across the road he became dizzy had a near syncopal episode.  Denying severe headache with this.  No recent cough or congestion.  Does have history of COPD.  EMS was called and patient given 2 nitroglycerin along with aspirin with no relief.     Past Medical History:  Diagnosis Date  . Alcohol abuse    Heavy alcohol abuse and homelessness  . Arthritis   . CHF (congestive heart failure) (HCC)   . Cocaine abuse (HCC)    And crack   . COPD (chronic obstructive pulmonary disease) (HCC)   . Heart failure   . Homelessness   . Suicide attempt (HCC)   . Tobacco abuse     Patient Active Problem List   Diagnosis Date Noted  . Tobacco dependence 09/27/2017  . Chronic systolic CHF (congestive heart failure) (HCC) 09/25/2017  . COPD with acute exacerbation (HCC) 09/30/2015  . Substance induced mood disorder (HCC) 02/04/2013  . Polysubstance (excluding opioids) dependence, daily use (HCC) 02/03/2013  . Alcohol dependence (HCC) 02/03/2013  . Gout attack 11/12/2012  . Closed fracture of 5th metacarpal 11/12/2012  . Chest pain at rest 08/27/2012  . COPD (chronic obstructive pulmonary disease) (HCC)   . Homelessness   . Cocaine abuse [facility])     Past Surgical History:  Procedure Laterality Date  . INNER EAR SURGERY          Home Medications    Prior to Admission medications   Medication Sig Start Date End Date Taking? Authorizing Provider  albuterol (PROVENTIL HFA;VENTOLIN HFA) 108 (90 Base) MCG/ACT inhaler Inhale  2 puffs into the lungs every 6 (six) hours as needed for wheezing or shortness of breath. 10/01/17   Lonia Blood, MD  aspirin EC 81 MG EC tablet Take 1 tablet (81 mg total) by mouth daily. 10/02/17   Lonia Blood, MD  furosemide (LASIX) 20 MG tablet Take 3 tablets (60 mg total) by mouth 2 (two) times daily. 10/01/17   Lonia Blood, MD  labetalol (NORMODYNE) 100 MG tablet Take 1 tablet (100 mg total) by mouth 2 (two) times daily. 10/01/17   Lonia Blood, MD  Lido-Capsaicin-Men-Methyl Sal (1ST MEDX-PATCH/ LIDOCAINE) 4-0.373-08-27 % PTCH Apply 1 patch topically 2 (two) times daily. Apply to the area left of your neck, on the top of your shoulder 10/09/17   Gerhard Munch, MD  lisinopril (PRINIVIL,ZESTRIL) 5 MG tablet Take 1 tablet (5 mg total) by mouth daily. 10/01/17   Lonia Blood, MD    Family History Family History  Problem Relation Age of Onset  . Coronary artery disease Unknown   . Diabetes Unknown   . Cancer Sister     Social History Social History   Tobacco Use  . Smoking status: Current Every Day Smoker    Packs/day: 2.00    Types: Cigarettes  . Smokeless tobacco: Never Used  Substance Use Topics  . Alcohol use: Yes    Comment: Heavy. 5-10 40oz daily  .  Drug use: No    Types: Marijuana, Cocaine    Comment: Marijuana and cocaine, crack     Allergies   Patient has no known allergies.   Review of Systems Review of Systems  All other systems reviewed and are negative.    Physical Exam Updated Vital Signs BP 110/75   Pulse (!) 107   Temp 98.2 F (36.8 C) (Oral)   Resp 18   Ht 1.702 m (5\' 7" )   Wt 81.2 kg (179 lb)   SpO2 94%   BMI 28.04 kg/m   Physical Exam  Constitutional: He is oriented to person, place, and time. He appears well-developed and well-nourished.  Non-toxic appearance. No distress.  HENT:  Head: Normocephalic and atraumatic.  Eyes: Pupils are equal, round, and reactive to light. Conjunctivae, EOM and lids are normal.   Neck: Normal range of motion. Neck supple. No tracheal deviation present. No thyroid mass present.  Cardiovascular: Normal rate, regular rhythm and normal heart sounds. Exam reveals no gallop.  No murmur heard. Pulmonary/Chest: Effort normal and breath sounds normal. No stridor. No respiratory distress. He has no decreased breath sounds. He has no wheezes. He has no rhonchi. He has no rales.  Patient is pinpoint tender along his left anterior chest wall as well as his left trapezius.  No crepitus appreciated.    Abdominal: Soft. Normal appearance and bowel sounds are normal. He exhibits no distension. There is no tenderness. There is no rebound and no CVA tenderness.  Musculoskeletal: Normal range of motion. He exhibits no edema or tenderness.  Neurological: He is alert and oriented to person, place, and time. He has normal strength. No cranial nerve deficit or sensory deficit. GCS eye subscore is 4. GCS verbal subscore is 5. GCS motor subscore is 6.  Skin: Skin is warm and dry. No abrasion and no rash noted.  Psychiatric: He has a normal mood and affect. His speech is normal and behavior is normal.  Nursing note and vitals reviewed.    ED Treatments / Results  Labs (all labs ordered are listed, but only abnormal results are displayed) Labs Reviewed  BASIC METABOLIC PANEL  CBC  BRAIN NATRIURETIC PEPTIDE  ETHANOL  I-STAT TROPONIN, ED    EKG EKG Interpretation  Date/Time:  Sunday October 14 2017 16:01:51 EDT Ventricular Rate:  107 PR Interval:    QRS Duration: 77 QT Interval:  334 QTC Calculation: 446 R Axis:   65 Text Interpretation:  Sinus tachycardia LAE, consider biatrial enlargement Anteroseptal infarct, old Confirmed by Lorre Nick ([id number]) on 10/14/2017 4:22:21 PM   Radiology No results found.  Procedures Procedures (including critical care time)  Medications Ordered in ED Medications  LORazepam (ATIVAN) injection 1 mg (has no administration in time range)      Initial Impression / Assessment and Plan / ED Course  I have reviewed the triage vital signs and the nursing notes.  Pertinent labs & imaging results that were available during my care of the patient were reviewed by me and considered in my medical decision making (see chart for details).    pts sx atypical for ACS pts initial troponin wnl however repeat was elevated Will admit for serial cardiac enzymes    Final Clinical Impressions(s) / ED Diagnoses   Final diagnoses:  None    ED Discharge Orders    None       Lorre Nick, MD 10/17/17 1424

## 2017-10-14 NOTE — H&P (Signed)
History and Physical    Brian [name] GNO:[id number] DOB: 1955/12/10 DOA: 10/14/2017  PCP: Patient, [name]   Patient coming from: Home (homeless)   Chief Complaint: Chest pain   HPI: Brian [name] is a 62 y.o. male with medical history significant for homelessness, cocaine and alcohol abuse, chronic combined systolic and diastolic CHF, and COPD, now presenting to the emergency department for evaluation of chest pain.  Patient reports several days of intermittent chest pain with left shoulder ache.  He was seen in the emergency department a few days ago for this, had reassuring work-up and was discharged from the ED, but symptoms have been recurring since that time.  He is unable to identify alleviating or exacerbating factors.  Reports some chronic shortness of breath, but no recent increased cough or sputum production.  Denies any fevers or chills.  No leg swelling or tenderness.  EMS was called and the patient was treated with 324 mg of aspirin and nitroglycerin x2 prior to arrival in the ED.  ED Course: Upon arrival to the ED, patient is found to be afebrile, saturating upper 80s to low 90s on room air, slightly tachypneic, slightly tachycardic, and with stable blood pressure.  EKG features a sinus tachycardia with rate 107 and left atrial enlargement.  Chest x-ray is negative for acute cardiopulmonary disease.  Chemistry panel and CBC are unremarkable, BNP is elevated 577, initial troponin is normal, and second troponin is slightly elevated at 0.03.  Ethanol level is 218.  Patient was treated with IV Ativan and 60 mg IV Lasix in the ED.  Tachycardia resolved, blood pressure remained stable, there is no apparent respiratory distress, and the patient will be observed for ongoing evaluation and management of transient chest pain with positive troponin.  Review of Systems:  All other systems reviewed and apart from HPI, are negative.  Past Medical History:  Diagnosis Date  . Alcohol  abuse    Heavy alcohol abuse and homelessness  . Arthritis   . CHF (congestive heart failure) (HCC)   . Cocaine abuse (HCC)    And crack   . COPD (chronic obstructive pulmonary disease) (HCC)   . Heart failure   . Homelessness   . Suicide attempt (HCC)   . Tobacco abuse     Past Surgical History:  Procedure Laterality Date  . INNER EAR SURGERY       reports that he has been smoking cigarettes.  He has been smoking about 2.00 packs per day. He has never used smokeless tobacco. He reports that he drinks alcohol. He reports that he does not use drugs.  No Known Allergies  Family History  Problem Relation Age of Onset  . Coronary artery disease Unknown   . Diabetes Unknown   . Cancer Sister      Prior to Admission medications   Medication Sig Start Date End Date Taking? Authorizing Provider  albuterol (PROVENTIL HFA;VENTOLIN HFA) 108 (90 Base) MCG/ACT inhaler Inhale 2 puffs into the lungs every 6 (six) hours as needed for wheezing or shortness of breath. Patient not taking: Reported on 10/14/2017 10/01/17   Lonia Blood, MD  aspirin EC 81 MG EC tablet Take 1 tablet (81 mg total) by mouth daily. Patient not taking: Reported on 10/14/2017 10/02/17   Lonia Blood, MD  furosemide (LASIX) 20 MG tablet Take 3 tablets (60 mg total) by mouth 2 (two) times daily. Patient not taking: Reported on 10/14/2017 10/01/17   Lonia Blood, MD  labetalol (NORMODYNE) 100 MG tablet Take 1 tablet (100 mg total) by mouth 2 (two) times daily. Patient not taking: Reported on 10/14/2017 10/01/17   Lonia Blood, MD  Lido-Capsaicin-Men-Methyl Sal (1ST MEDX-PATCH/ LIDOCAINE) 4-0.373-08-27 % PTCH Apply 1 patch topically 2 (two) times daily. Apply to the area left of your neck, on the top of your shoulder Patient not taking: Reported on 10/14/2017 10/09/17   Gerhard Munch, MD  lisinopril (PRINIVIL,ZESTRIL) 5 MG tablet Take 1 tablet (5 mg total) by mouth daily. Patient not taking: Reported on  10/14/2017 10/01/17   Lonia Blood, MD    Physical Exam: Vitals:   10/14/17 1900 10/14/17 1930 10/14/17 2030 10/14/17 2100  BP: 98/68 (!) 124/95 109/79 101/60  Pulse: 91 (!) 101 (!) 101 90  Resp:  20 20 (!) 31  Temp:      TempSrc:      SpO2: 94% 99% (!) 88% 94%  Weight:      Height:          Constitutional: NAD, calm  Eyes: PERTLA, lids and conjunctivae normal ENMT: Mucous membranes are moist. Posterior pharynx clear of any exudate or lesions.   Neck: normal, supple, no masses, no thyromegaly Respiratory: Breath sounds diminished bilaterally, no wheezing, no crackles. Normal respiratory effort.   Cardiovascular: S1 & S2 heard, regular rate and rhythm. Trace pedal edema bilaterally. Abdomen: No distension, no tenderness, no masses palpated. Bowel sounds normal.  Musculoskeletal: no clubbing / cyanosis. No joint deformity upper and lower extremities.   Skin: no significant rashes, lesions, ulcers. Warm, dry, well-perfused. Neurologic: No facial asymmetry. Sensation intact. Strength 5/5 in all 4 limbs.  Psychiatric: Alert and oriented to person, place, and situation. Pleasant and cooperative.     Labs on Admission: I have personally reviewed following labs and imaging studies  CBC: Recent Labs  Lab 10/09/17 1632 10/14/17 1612  WBC 8.9 8.1  NEUTROABS 6.0  --   HGB 15.1 15.9  HCT 48.6 50.2  MCV 99.4 96.7  PLT 266 222   Basic Metabolic Panel: Recent Labs  Lab 10/09/17 1632 10/14/17 1612  NA 141 141  K 4.8 4.4  CL 107 106  CO2 23 23  GLUCOSE 103* 92  BUN 14 15  CREATININE 0.83 0.87  CALCIUM 8.6* 8.9  MG 1.9  --    GFR: Estimated Creatinine Clearance: 93.2 mL/min (by C-G formula based on SCr of 0.87 mg/dL). Liver Function Tests: Recent Labs  Lab 10/09/17 1632  AST 47*  ALT 51*  ALKPHOS 39  BILITOT 1.5*  PROT 6.2*  ALBUMIN 3.3*   No results for input(s): LIPASE, AMYLASE in the last 168 hours. No results for input(s): AMMONIA in the last 168  hours. Coagulation Profile: Recent Labs  Lab 10/09/17 1632  INR 0.97   Cardiac Enzymes: Recent Labs  Lab 10/09/17 1632 10/14/17 2029  TROPONINI <0.03 0.03*   BNP (last 3 results) No results for input(s): PROBNP in the last 8760 hours. HbA1C: No results for input(s): HGBA1C in the last 72 hours. CBG: Recent Labs  Lab 10/09/17 1705  GLUCAP 90   Lipid Profile: No results for input(s): CHOL, HDL, LDLCALC, TRIG, CHOLHDL, LDLDIRECT in the last 72 hours. Thyroid Function Tests: No results for input(s): TSH, T4TOTAL, FREET4, T3FREE, THYROIDAB in the last 72 hours. Anemia Panel: No results for input(s): VITAMINB12, FOLATE, FERRITIN, TIBC, IRON, RETICCTPCT in the last 72 hours. Urine analysis:    Component Value Date/Time   COLORURINE Yellow 10/10/2012 0320   COLORURINE  YELLOW 08/27/2012 0659   APPEARANCEUR Clear 10/10/2012 0320   LABSPEC 1.009 10/10/2012 0320   PHURINE 5.0 10/10/2012 0320   PHURINE 5.5 08/27/2012 0659   GLUCOSEU Negative 10/10/2012 0320   HGBUR Negative 10/10/2012 0320   HGBUR NEGATIVE 08/27/2012 0659   BILIRUBINUR Negative 10/10/2012 0320   KETONESUR Negative 10/10/2012 0320   KETONESUR NEGATIVE 08/27/2012 0659   PROTEINUR Negative 10/10/2012 0320   PROTEINUR NEGATIVE 08/27/2012 0659   UROBILINOGEN 0.2 08/27/2012 0659   NITRITE Negative 10/10/2012 0320   NITRITE NEGATIVE 08/27/2012 0659   LEUKOCYTESUR Negative 10/10/2012 0320   Sepsis Labs: @LABRCNTIP (procalcitonin:4,lacticidven:4) )No results found for this or any previous visit (from the past 240 hour(s)).   Radiological Exams on Admission: Dg Chest 2 View  Result Date: 10/14/2017 CLINICAL DATA:  Mid chest pain.  Shortness of breath. EXAM: CHEST - 2 VIEW COMPARISON:  October 09, 2016 FINDINGS: Stable cardiomegaly. The hila and mediastinum are normal. No pneumothorax. No pulmonary nodules or masses. No focal infiltrates. IMPRESSION: No active cardiopulmonary disease. Electronically Signed   By: Gerome Sam III M.D   On: 10/14/2017 16:55    EKG: Independently reviewed. Sinus tachycardia (rate 107), LAE.   Assessment/Plan  1. Chest pain; positive troponin   - Presents with several days of intermittent chest pain, treated with ASA 324 and NTG x2 by EMS prior to arrival  - Pain has eased off and resolved prior to admission  - EKG with sinus rhythm, no acute ischemic features  - Initial troponin is normal, second is slightly positive at 0.03  - Continue cardiac monitoring, trend troponin, continue ASA and lisinopril, hold beta-blocker in light of ongoing cocaine use    2. COPD  - No cough or wheezing on admission  - Continue prn nebs   3. Chronic systolic CHF  - Appears well-compensated  - Had TTE last month with EF 30-35%, diffuse HK, grade 2 diastolic dysfunction, and mild-mod LAE  - Treated with Lasix 60 mg IV in ED  - Follow daily wt, resume PO Lasix in am, continue ACE-i, avoid beta-blocker in light of ongoing cocaine abuse    4. Alcohol and drug abuse  - Patient has ongoing EtOH and cocaine abuse  - Counseled toward cessation  - EtOH level 218 in ED  - Monitor with CIWA and prn Ativan, supplement vitamins     DVT prophylaxis: Lovenox Code Status: Full  Family Communication: Discussed with patient  Consults called: None Admission status: Observation     Briscoe Deutscher, MD [facility] Pager [phone]  If 7PM-7AM, please contact night-coverage [email or web address] Password [email or web address]  10/14/2017, 10:07 PM

## 2017-10-15 DIAGNOSIS — I5042 Chronic combined systolic (congestive) and diastolic (congestive) heart failure: Secondary | ICD-10-CM | POA: Diagnosis not present

## 2017-10-15 DIAGNOSIS — F1420 Cocaine dependence, uncomplicated: Secondary | ICD-10-CM | POA: Diagnosis not present

## 2017-10-15 DIAGNOSIS — R079 Chest pain, unspecified: Secondary | ICD-10-CM | POA: Diagnosis not present

## 2017-10-15 DIAGNOSIS — J449 Chronic obstructive pulmonary disease, unspecified: Secondary | ICD-10-CM | POA: Diagnosis not present

## 2017-10-15 LAB — BASIC METABOLIC PANEL
ANION GAP: 8 (ref 5–15)
BUN: 20 mg/dL (ref 6–20)
CALCIUM: 9 mg/dL (ref 8.9–10.3)
CO2: 33 mmol/L — AB (ref 22–32)
Chloride: 101 mmol/L (ref 98–111)
Creatinine, Ser: 1.12 mg/dL (ref 0.61–1.24)
Glucose, Bld: 108 mg/dL — ABNORMAL HIGH (ref 70–99)
POTASSIUM: 5.1 mmol/L (ref 3.5–5.1)
Sodium: 142 mmol/L (ref 135–145)

## 2017-10-15 LAB — TROPONIN I: Troponin I: 0.03 ng/mL (ref <0.03)

## 2017-10-15 NOTE — Progress Notes (Signed)
Patient had 12 runs of vtach, pt is asymptomatic, MD notified. Will continue to monitor.

## 2017-10-15 NOTE — Care Management Note (Addendum)
Case Management Note  Patient Details  Name: Brian [name] MRN: [id number] Date of Birth: October 22, 1955  Subjective/Objective:   Chest Pain               Action/Plan: Patient admitted 3 times in 6 months; patient used the [facility] fund recently and does not qualify for the program. ( patient can only use the program once a year). CM will continue to follow for progression of care. SW is aware of admission. Alexis Goodell [phone]  Subjective/Objective:  Patient is [place] per CSW [place], he will need follow [facility] and will be able to get meds from the [facility], He may need a Match letter at [employer] and a bus pass. Patient left [place] 6[place]19 and now back again. He will need Match letter at dc if eligible. Irish Lack RN,CM  6/24 Letha Cape RN, BSN- NCM gave patient Match Letter to ast with getting meds from [facility].   Expected Discharge Date:    possibly 10/16/2017              Expected Discharge Plan:   Home  In-House Referral:   SW, Financial Counselor  Status of Service:   In progress  Reola Mosher [phone] 10/15/2017, 10:07 AM

## 2017-10-15 NOTE — Progress Notes (Signed)
Patient is having chest pain of 8/10 but is refusing nitro. MD notified.

## 2017-10-15 NOTE — Progress Notes (Signed)
PROGRESS NOTE    Brian [name]  ZOX:[id number] DOB: January 23, 1956 DOA: 10/14/2017 PCP: Patient, [name]  Brief Narrative89 y.o. male with medical history significant for homelessness, cocaine and alcohol abuse, chronic combined systolic and diastolic CHF, and COPD, now presenting to the emergency department for evaluation of chest pain.  Patient reports several days of intermittent chest pain with left shoulder ache.  He was seen in the emergency department a few days ago for this, had reassuring work-up and was discharged from the ED, but symptoms have been recurring since that time.  He is unable to identify alleviating or exacerbating factors.  Reports some chronic shortness of breath, but no recent increased cough or sputum production.  Denies any fevers or chills.  No leg swelling or tenderness.  EMS was called and the patient was treated with 324 mg of aspirin and nitroglycerin x2 prior to arrival in the ED.  ED Course: Upon arrival to the ED, patient is found to be afebrile, saturating upper 80s to low 90s on room air, slightly tachypneic, slightly tachycardic, and with stable blood pressure.  EKG features a sinus tachycardia with rate 107 and left atrial enlargement.  Chest x-ray is negative for acute cardiopulmonary disease.  Chemistry panel and CBC are unremarkable, BNP is elevated 577, initial troponin is normal, and second troponin is slightly elevated at 0.03.  Ethanol level is 218.  Patient was treated with IV Ativan and 60 mg IV Lasix in the ED.  Tachycardia resolved, blood pressure remained stable, there is no apparent respiratory distress, and the patient will be observed for ongoing evaluation and management of transient chest pain with positive troponin.    Assessment & Plan:   Principal Problem:   Chest pain Active Problems:   COPD (chronic obstructive pulmonary disease) (HCC)   Polysubstance (excluding opioids) dependence, daily use (HCC)   Alcohol dependence (HCC)  Chronic systolic CHF (congestive heart failure) (HCC)   1] chest pain in a patient with history of ongoing cocaine abuse.  Troponin mildly elevated at 0.03 and maxed out.  Currently he is pain-free.  Continue aspirin and lisinopril.  Continue to hold beta-blocker.  2] chronic systolic CHF ejection fraction 30 to 35% last month will not repeat echo at this time.  Continue home dose of Lasix, hold ACE inhibitor due to rising creatinine.  BNP was 577 upon admission.  3] history of alcohol abuse on CIWA protocol continue.   DVT prophylaxis: Lovenox Code Status: Full code none Family Communication Disposition Plan: Social worker consult patient is homeless plan for discharge tomorrow if he remains stable. Consultants: None  Procedures: None Antimicrobials none Subjective: Resting in bed when I walked into the room he was in deep sleep appeared comfortable in no acute distress I was able to wake him up he sat up answer all my questions appropriately no tremors noted.   Objective: Vitals:   10/14/17 2243 10/14/17 2254 10/15/17 0200 10/15/17 0607  BP:  130/87 (!) 142/94 131/86  Pulse: 96 (!) 108 97 89  Resp: (!) 32 (!) 30 (!) 25 (!) 22  Temp:  98.8 F (37.1 C) 98.4 F (36.9 C) 98 F (36.7 C)  TempSrc:  Oral Oral Oral  SpO2: 98% 95% 96% 97%  Weight: 81.1 kg (178 lb 14.4 oz)   81.7 kg (180 lb 3.2 oz)  Height:        Intake/Output Summary (Last 24 hours) at 10/15/2017 1022 Last data filed at 10/15/2017 0849 Gross per 24 hour  Intake 600 ml  Output 725 ml  Net -125 ml   Filed Weights   10/14/17 1606 10/14/17 2243 10/15/17 0607  Weight: 81.2 kg (179 lb) 81.1 kg (178 lb 14.4 oz) 81.7 kg (180 lb 3.2 oz)    Examination:  General exam: Appears calm and comfortable  Respiratory system: Scattered rhonchi auscultation. Respiratory effort normal. Cardiovascular system: S1 & S2 heard, RRR. No JVD, murmurs, rubs, gallops or clicks. No pedal edema. Gastrointestinal system: Abdomen is  nondistended, soft and nontender. No organomegaly or masses felt. Normal bowel sounds heard. Central nervous system: Alert and oriented. No focal neurological deficits. Extremities: Symmetric 5 x 5 power.  No tremors Skin: No rashes, lesions or ulcers      Data Reviewed: I have personally reviewed following labs and imaging studies  CBC: Recent Labs  Lab 10/09/17 1632 10/14/17 1612  WBC 8.9 8.1  NEUTROABS 6.0  --   HGB 15.1 15.9  HCT 48.6 50.2  MCV 99.4 96.7  PLT 266 222   Basic Metabolic Panel: Recent Labs  Lab 10/09/17 1632 10/14/17 1612 10/15/17 0411  NA 141 141 142  K 4.8 4.4 5.1  CL 107 106 101  CO2 23 23 33*  GLUCOSE 103* 92 108*  BUN 14 15 20   CREATININE 0.83 0.87 1.12  CALCIUM 8.6* 8.9 9.0  MG 1.9  --   --    GFR: Estimated Creatinine Clearance: 72.6 mL/min (by C-G formula based on SCr of 1.12 mg/dL). Liver Function Tests: Recent Labs  Lab 10/09/17 1632  AST 47*  ALT 51*  ALKPHOS 39  BILITOT 1.5*  PROT 6.2*  ALBUMIN 3.3*   No results for input(s): LIPASE, AMYLASE in the last 168 hours. No results for input(s): AMMONIA in the last 168 hours. Coagulation Profile: Recent Labs  Lab 10/09/17 1632  INR 0.97   Cardiac Enzymes: Recent Labs  Lab 10/09/17 1632 10/14/17 2029 10/15/17 0411  TROPONINI <0.03 0.03* 0.03*   BNP (last 3 results) No results for input(s): PROBNP in the last 8760 hours. HbA1C: No results for input(s): HGBA1C in the last 72 hours. CBG: Recent Labs  Lab 10/09/17 1705  GLUCAP 90   Lipid Profile: No results for input(s): CHOL, HDL, LDLCALC, TRIG, CHOLHDL, LDLDIRECT in the last 72 hours. Thyroid Function Tests: No results for input(s): TSH, T4TOTAL, FREET4, T3FREE, THYROIDAB in the last 72 hours. Anemia Panel: No results for input(s): VITAMINB12, FOLATE, FERRITIN, TIBC, IRON, RETICCTPCT in the last 72 hours. Sepsis Labs: No results for input(s): PROCALCITON, LATICACIDVEN in the last 168 hours.  No results found for  this or any previous visit (from the past 240 hour(s)).       Radiology Studies: Dg Chest 2 View  Result Date: 10/14/2017 CLINICAL DATA:  Mid chest pain.  Shortness of breath. EXAM: CHEST - 2 VIEW COMPARISON:  October 09, 2016 FINDINGS: Stable cardiomegaly. The hila and mediastinum are normal. No pneumothorax. No pulmonary nodules or masses. No focal infiltrates. IMPRESSION: No active cardiopulmonary disease. Electronically Signed   By: Gerome Sam III M.D   On: 10/14/2017 16:55        Scheduled Meds: . aspirin EC  81 mg Oral Daily  . enoxaparin (LOVENOX) injection  40 mg Subcutaneous Q24H  . folic acid  1 mg Oral Daily  . furosemide  60 mg Oral BID  . LORazepam  0-4 mg Oral Q6H   Followed by  . [START ON 10/16/2017] LORazepam  0-4 mg Oral Q12H  . multivitamin with minerals  1 tablet Oral Daily  . sodium chloride flush  3 mL Intravenous Q12H  . thiamine  100 mg Oral Daily   Or  . thiamine  100 mg Intravenous Daily   Continuous Infusions: . sodium chloride       LOS: 0 days     Alwyn Ren, MD [facility]  If 7PM-7AM, please contact night-coverage [email or web address] Password [facility] 10/15/2017, 10:22 AM

## 2017-10-16 ENCOUNTER — Observation Stay (HOSPITAL_COMMUNITY): Payer: Medicaid Other

## 2017-10-16 DIAGNOSIS — F1020 Alcohol dependence, uncomplicated: Secondary | ICD-10-CM

## 2017-10-16 DIAGNOSIS — F1420 Cocaine dependence, uncomplicated: Secondary | ICD-10-CM | POA: Diagnosis not present

## 2017-10-16 DIAGNOSIS — R079 Chest pain, unspecified: Secondary | ICD-10-CM | POA: Diagnosis not present

## 2017-10-16 DIAGNOSIS — I5042 Chronic combined systolic (congestive) and diastolic (congestive) heart failure: Secondary | ICD-10-CM | POA: Diagnosis not present

## 2017-10-16 DIAGNOSIS — I5022 Chronic systolic (congestive) heart failure: Secondary | ICD-10-CM

## 2017-10-16 DIAGNOSIS — I200 Unstable angina: Secondary | ICD-10-CM

## 2017-10-16 DIAGNOSIS — J449 Chronic obstructive pulmonary disease, unspecified: Secondary | ICD-10-CM | POA: Diagnosis not present

## 2017-10-16 DIAGNOSIS — J410 Simple chronic bronchitis: Secondary | ICD-10-CM

## 2017-10-16 LAB — D-DIMER, QUANTITATIVE: D-Dimer, Quant: 0.52 ug/mL-FEU — ABNORMAL HIGH (ref 0.00–0.50)

## 2017-10-16 LAB — BASIC METABOLIC PANEL
Anion gap: 13 (ref 5–15)
BUN: 16 mg/dL (ref 6–20)
CO2: 30 mmol/L (ref 22–32)
CREATININE: 0.99 mg/dL (ref 0.61–1.24)
Calcium: 9.2 mg/dL (ref 8.9–10.3)
Chloride: 97 mmol/L — ABNORMAL LOW (ref 98–111)
GFR calc Af Amer: >60 mL/min (ref >60)
GFR calc non Af Amer: >60 mL/min (ref >60)
Glucose, Bld: 109 mg/dL — ABNORMAL HIGH (ref 70–99)
POTASSIUM: 4.9 mmol/L (ref 3.5–5.1)
Sodium: 140 mmol/L (ref 135–145)

## 2017-10-16 MED ORDER — THIAMINE HCL 100 MG PO TABS: 100.0000 mg | ORAL_TABLET | Freq: Every day | ORAL | 0 refills | Status: DC

## 2017-10-16 NOTE — Progress Notes (Signed)
[facility] [phone]
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
## 2017-10-16 NOTE — Progress Notes (Signed)
Pt states he wants to leave AMA  Pt educated on risks including death. Informed pt that his lab values for D-dimer were elevated and can be indicative of a clot so the doctor ordered a CT of the chest to rule it out. Pt states he is not waiting, "doctor should have thought about that before, take out my IV"  Pt requesting bus pass, called Maralyn Sago, Child psychotherapist, she stated she cannot provide bus pass to pt leaving AMA  Pt aware and stated "I hope I fall so I can sue you"   Pt removed his own telemetry, IV removed by CN, catheter intact

## 2017-10-16 NOTE — Progress Notes (Signed)
Patient signed AMA document and left, 2 RNs made patient aware of risk of leaving including death.

## 2017-10-23 ENCOUNTER — Ambulatory Visit (INDEPENDENT_AMBULATORY_CARE_PROVIDER_SITE_OTHER): Payer: Self-pay | Admitting: Physician Assistant

## 2017-10-26 ENCOUNTER — Inpatient Hospital Stay (HOSPITAL_COMMUNITY)
Admission: EM | Admit: 2017-10-26 | Discharge: 2017-10-28 | DRG: 189 | Discharge status: Against Medical Advice | Payer: Medicaid Other | Attending: Internal Medicine | Admitting: Internal Medicine

## 2017-10-26 ENCOUNTER — Other Ambulatory Visit: Payer: Self-pay

## 2017-10-26 ENCOUNTER — Encounter (HOSPITAL_COMMUNITY): Payer: Self-pay | Admitting: Pharmacy Technician

## 2017-10-26 ENCOUNTER — Emergency Department (HOSPITAL_COMMUNITY): Payer: Medicaid Other

## 2017-10-26 DIAGNOSIS — J9622 Acute and chronic respiratory failure with hypercapnia: Secondary | ICD-10-CM | POA: Diagnosis present

## 2017-10-26 DIAGNOSIS — F1410 Cocaine abuse, uncomplicated: Secondary | ICD-10-CM | POA: Diagnosis present

## 2017-10-26 DIAGNOSIS — J9621 Acute and chronic respiratory failure with hypoxia: Principal | ICD-10-CM | POA: Diagnosis present

## 2017-10-26 DIAGNOSIS — M1990 Unspecified osteoarthritis, unspecified site: Secondary | ICD-10-CM | POA: Diagnosis present

## 2017-10-26 DIAGNOSIS — Z809 Family history of malignant neoplasm, unspecified: Secondary | ICD-10-CM | POA: Diagnosis not present

## 2017-10-26 DIAGNOSIS — I5022 Chronic systolic (congestive) heart failure: Secondary | ICD-10-CM | POA: Diagnosis present

## 2017-10-26 DIAGNOSIS — Z915 Personal history of self-harm: Secondary | ICD-10-CM

## 2017-10-26 DIAGNOSIS — I272 Pulmonary hypertension, unspecified: Secondary | ICD-10-CM | POA: Diagnosis present

## 2017-10-26 DIAGNOSIS — J9601 Acute respiratory failure with hypoxia: Secondary | ICD-10-CM | POA: Diagnosis present

## 2017-10-26 DIAGNOSIS — I110 Hypertensive heart disease with heart failure: Secondary | ICD-10-CM | POA: Diagnosis present

## 2017-10-26 DIAGNOSIS — F10231 Alcohol dependence with withdrawal delirium: Secondary | ICD-10-CM | POA: Diagnosis present

## 2017-10-26 DIAGNOSIS — R079 Chest pain, unspecified: Secondary | ICD-10-CM | POA: Diagnosis present

## 2017-10-26 DIAGNOSIS — F1920 Other psychoactive substance dependence, uncomplicated: Secondary | ICD-10-CM | POA: Diagnosis present

## 2017-10-26 DIAGNOSIS — J441 Chronic obstructive pulmonary disease with (acute) exacerbation: Secondary | ICD-10-CM | POA: Diagnosis present

## 2017-10-26 DIAGNOSIS — J9602 Acute respiratory failure with hypercapnia: Secondary | ICD-10-CM

## 2017-10-26 DIAGNOSIS — Z590 Homelessness: Secondary | ICD-10-CM | POA: Diagnosis not present

## 2017-10-26 DIAGNOSIS — Z9114 Patient's other noncompliance with medication regimen: Secondary | ICD-10-CM

## 2017-10-26 DIAGNOSIS — F17200 Nicotine dependence, unspecified, uncomplicated: Secondary | ICD-10-CM | POA: Diagnosis present

## 2017-10-26 DIAGNOSIS — F17210 Nicotine dependence, cigarettes, uncomplicated: Secondary | ICD-10-CM | POA: Diagnosis present

## 2017-10-26 DIAGNOSIS — F1020 Alcohol dependence, uncomplicated: Secondary | ICD-10-CM | POA: Diagnosis present

## 2017-10-26 LAB — BASIC METABOLIC PANEL
ANION GAP: 9 (ref 5–15)
BUN: 14 mg/dL (ref 6–20)
CO2: 29 mmol/L (ref 22–32)
Calcium: 8.7 mg/dL — ABNORMAL LOW (ref 8.9–10.3)
Chloride: 109 mmol/L (ref 98–111)
Creatinine, Ser: 1 mg/dL (ref 0.61–1.24)
GFR calc Af Amer: >60 mL/min (ref >60)
Glucose, Bld: 101 mg/dL — ABNORMAL HIGH (ref 70–99)
POTASSIUM: 4.1 mmol/L (ref 3.5–5.1)
SODIUM: 147 mmol/L — AB (ref 135–145)

## 2017-10-26 LAB — I-STAT ARTERIAL BLOOD GAS, ED
Acid-Base Excess: 1 mmol/L (ref 0.0–2.0)
BICARBONATE: 30.1 mmol/L — AB (ref 20.0–28.0)
O2 SAT: 83 %
Patient temperature: 99.4
TCO2: 32 mmol/L (ref 22–32)
pCO2 arterial: 70.2 mmHg (ref 32.0–48.0)
pH, Arterial: 7.243 — ABNORMAL LOW (ref 7.350–7.450)
pO2, Arterial: 59 mmHg — ABNORMAL LOW (ref 83.0–108.0)

## 2017-10-26 LAB — CBC
HEMATOCRIT: 49.3 % (ref 39.0–52.0)
HEMOGLOBIN: 15.3 g/dL (ref 13.0–17.0)
MCH: 31.1 pg (ref 26.0–34.0)
MCHC: 31 g/dL (ref 30.0–36.0)
MCV: 100.2 fL — ABNORMAL HIGH (ref 78.0–100.0)
Platelets: 206 10*3/uL (ref 150–400)
RBC: 4.92 MIL/uL (ref 4.22–5.81)
RDW: 14.6 % (ref 11.5–15.5)
WBC: 5.3 10*3/uL (ref 4.0–10.5)

## 2017-10-26 LAB — BRAIN NATRIURETIC PEPTIDE: B NATRIURETIC PEPTIDE 5: 258 pg/mL — AB (ref 0.0–100.0)

## 2017-10-26 LAB — I-STAT TROPONIN, ED: Troponin i, poc: 0.01 ng/mL (ref 0.00–0.08)

## 2017-10-26 LAB — ETHANOL: Alcohol, Ethyl (B): 183 mg/dL — ABNORMAL HIGH (ref <10)

## 2017-10-26 MED ORDER — ONDANSETRON HCL 4 MG PO TABS: 4.0000 mg | ORAL_TABLET | Freq: Four times a day (QID) | ORAL | Status: DC | PRN

## 2017-10-26 MED ORDER — FUROSEMIDE 40 MG PO TABS: 60.0000 mg | ORAL_TABLET | Freq: Two times a day (BID) | ORAL | Status: DC

## 2017-10-26 MED ORDER — ADULT MULTIVITAMIN W/MINERALS CH
1.0000 | ORAL_TABLET | Freq: Every day | ORAL | Status: DC
  Administered 2017-10-27 – 2017-10-28 (×2): 1 via ORAL
  Filled 2017-10-26 (×2): qty 1

## 2017-10-26 MED ORDER — IPRATROPIUM-ALBUTEROL 0.5-2.5 (3) MG/3ML IN SOLN
3.0000 mL | Freq: Four times a day (QID) | RESPIRATORY_TRACT | Status: DC
  Administered 2017-10-27: 3 mL via RESPIRATORY_TRACT
  Filled 2017-10-26: qty 3

## 2017-10-26 MED ORDER — LABETALOL HCL 100 MG PO TABS
100.0000 mg | ORAL_TABLET | Freq: Two times a day (BID) | ORAL | Status: DC
  Administered 2017-10-27 – 2017-10-28 (×4): 100 mg via ORAL
  Filled 2017-10-26 (×5): qty 1

## 2017-10-26 MED ORDER — ACETAMINOPHEN 650 MG RE SUPP: 650.0000 mg | Freq: Four times a day (QID) | RECTAL | Status: DC | PRN

## 2017-10-26 MED ORDER — ACETAMINOPHEN 325 MG PO TABS: 650.0000 mg | ORAL_TABLET | Freq: Four times a day (QID) | ORAL | Status: DC | PRN

## 2017-10-26 MED ORDER — LISINOPRIL 5 MG PO TABS
5.0000 mg | ORAL_TABLET | Freq: Every day | ORAL | Status: DC
  Administered 2017-10-27 – 2017-10-28 (×2): 5 mg via ORAL
  Filled 2017-10-26 (×2): qty 1

## 2017-10-26 MED ORDER — VITAMIN B-1 100 MG PO TABS
100.0000 mg | ORAL_TABLET | Freq: Every day | ORAL | Status: DC
  Administered 2017-10-27 – 2017-10-28 (×2): 100 mg via ORAL
  Filled 2017-10-26 (×2): qty 1

## 2017-10-26 MED ORDER — LORAZEPAM 2 MG/ML IJ SOLN
2.0000 mg | INTRAMUSCULAR | Status: DC | PRN
  Administered 2017-10-27 (×3): 2 mg via INTRAVENOUS
  Administered 2017-10-27: 3 mg via INTRAVENOUS
  Administered 2017-10-27 – 2017-10-28 (×4): 2 mg via INTRAVENOUS
  Administered 2017-10-28: 3 mg via INTRAVENOUS
  Administered 2017-10-28: 2 mg via INTRAVENOUS
  Filled 2017-10-26 (×2): qty 1
  Filled 2017-10-26: qty 2
  Filled 2017-10-26: qty 1
  Filled 2017-10-26 (×2): qty 2
  Filled 2017-10-26 (×5): qty 1

## 2017-10-26 MED ORDER — METHYLPREDNISOLONE SODIUM SUCC 125 MG IJ SOLR
60.0000 mg | Freq: Four times a day (QID) | INTRAMUSCULAR | Status: DC
  Administered 2017-10-27 – 2017-10-28 (×6): 60 mg via INTRAVENOUS
  Filled 2017-10-26 (×6): qty 2

## 2017-10-26 MED ORDER — ONDANSETRON HCL 4 MG/2ML IJ SOLN: 4.0000 mg | Freq: Four times a day (QID) | INTRAMUSCULAR | Status: DC | PRN

## 2017-10-26 MED ORDER — IPRATROPIUM BROMIDE 0.02 % IN SOLN
0.5000 mg | Freq: Once | RESPIRATORY_TRACT | Status: AC
  Administered 2017-10-26: 0.5 mg via RESPIRATORY_TRACT
  Filled 2017-10-26: qty 2.5

## 2017-10-26 MED ORDER — ASPIRIN EC 81 MG PO TBEC
81.0000 mg | DELAYED_RELEASE_TABLET | Freq: Every day | ORAL | Status: DC
  Administered 2017-10-27 – 2017-10-28 (×2): 81 mg via ORAL
  Filled 2017-10-26 (×2): qty 1

## 2017-10-26 MED ORDER — FOLIC ACID 1 MG PO TABS
1.0000 mg | ORAL_TABLET | Freq: Every day | ORAL | Status: DC
  Administered 2017-10-27 – 2017-10-28 (×2): 1 mg via ORAL
  Filled 2017-10-26 (×2): qty 1

## 2017-10-26 MED ORDER — ALBUTEROL SULFATE (2.5 MG/3ML) 0.083% IN NEBU
5.0000 mg | INHALATION_SOLUTION | Freq: Once | RESPIRATORY_TRACT | Status: AC
  Administered 2017-10-26: 5 mg via RESPIRATORY_TRACT
  Filled 2017-10-26: qty 6

## 2017-10-26 MED ORDER — BISACODYL 5 MG PO TBEC: 5.0000 mg | DELAYED_RELEASE_TABLET | Freq: Every day | ORAL | Status: DC | PRN

## 2017-10-26 MED ORDER — DM-GUAIFENESIN ER 30-600 MG PO TB12
1.0000 | ORAL_TABLET | Freq: Two times a day (BID) | ORAL | Status: DC
  Administered 2017-10-27 – 2017-10-28 (×4): 1 via ORAL
  Filled 2017-10-26 (×5): qty 1

## 2017-10-26 MED ORDER — SODIUM CHLORIDE 0.9 % IV SOLN
500.0000 mg | INTRAVENOUS | Status: DC
  Administered 2017-10-27 (×2): 500 mg via INTRAVENOUS
  Filled 2017-10-26 (×3): qty 500

## 2017-10-26 MED ORDER — METHYLPREDNISOLONE SODIUM SUCC 125 MG IJ SOLR
125.0000 mg | Freq: Once | INTRAMUSCULAR | Status: AC
  Administered 2017-10-26: 125 mg via INTRAVENOUS
  Filled 2017-10-26: qty 2

## 2017-10-26 NOTE — Progress Notes (Signed)
Pt placed on BIPAP at this time. Pt tolerating current BIPAP settings, but due to lots of facial hair, RT unable to get good seal. Pt still receiving adequate volumes. RT will continue to monitor.

## 2017-10-26 NOTE — ED Notes (Signed)
Pt now refusing to stay on Bipap. States that "it's smothering him". Placed on 5Lpm via [place].

## 2017-10-26 NOTE — ED Notes (Signed)
Pt c/o L arm numbness/tingling onset 2-3 days ago. No other neuro deficits noted.

## 2017-10-26 NOTE — ED Notes (Signed)
Patient transported to X-ray 

## 2017-10-26 NOTE — ED Notes (Signed)
This RN attempted to wake pt, pt only responding to pain. Sat pt up in bed- pt immediately opened his eyes and started pulling off BP cuff and EKG leads stating "get this shit off of me, I'm fucking leaving". This RN notified EDP- when EDP went to assess pt- pt sleeping again.

## 2017-10-26 NOTE — ED Notes (Signed)
ED Provider at bedside. 

## 2017-10-26 NOTE — ED Provider Notes (Signed)
[facility] EMERGENCY DEPARTMENT Provider Note   CSN: [id number] Arrival date & time: 10/26/17  1734     History   Chief Complaint Chief Complaint  Patient presents with  . Chest Pain    HPI JORDY FOLTYN is a 62 y.o. male.  HPI  62 year old male with a history of homelessness, alcohol abuse, CHF and COPD who currently still smokes presents with shortness of breath and chest pain.  Started about 2 days ago.  He has been having sharp intermittent left-sided chest pain.  Has had a cough with green sputum.  He denies any known fevers.  He does not normally wear oxygen but his oxygen level has been in the 80s here.  He also endorses some leg swelling that has also been for the past 2 days.  Last drink alcohol today where he states he has had one beer.  He also has been having some tingling to his fingertips on the left side.  Past Medical History:  Diagnosis Date  . Alcohol abuse    Heavy alcohol abuse and homelessness  . Arthritis   . CHF (congestive heart failure) (HCC)   . Cocaine abuse (HCC)    And crack   . COPD (chronic obstructive pulmonary disease) (HCC)   . Heart failure   . Homelessness   . Suicide attempt (HCC)   . Tobacco abuse     Patient Active Problem List   Diagnosis Date Noted  . Tobacco dependence 09/27/2017  . Chronic systolic CHF (congestive heart failure) (HCC) 09/25/2017  . COPD with acute exacerbation (HCC) 09/30/2015  . Substance induced mood disorder (HCC) 02/04/2013  . Polysubstance (excluding opioids) dependence, daily use (HCC) 02/03/2013  . Alcohol dependence (HCC) 02/03/2013  . Gout attack 11/12/2012  . Closed fracture of 5th metacarpal 11/12/2012  . Chest pain 08/27/2012  . COPD (chronic obstructive pulmonary disease) (HCC)   . Homelessness   . Cocaine abuse [facility])     Past Surgical History:  Procedure Laterality Date  . INNER EAR SURGERY          Home Medications    Prior to Admission medications     Medication Sig Start Date End Date Taking? Authorizing Provider  thiamine 100 MG tablet Take 1 tablet (100 mg total) by mouth daily. 10/17/17  Yes Richarda Overlie, MD  albuterol (PROVENTIL HFA;VENTOLIN HFA) 108 (90 Base) MCG/ACT inhaler Inhale 2 puffs into the lungs every 6 (six) hours as needed for wheezing or shortness of breath. Patient not taking: Reported on 10/14/2017 10/01/17   Lonia Blood, MD  aspirin EC 81 MG EC tablet Take 1 tablet (81 mg total) by mouth daily. Patient not taking: Reported on 10/14/2017 10/02/17   Lonia Blood, MD  furosemide (LASIX) 20 MG tablet Take 3 tablets (60 mg total) by mouth 2 (two) times daily. Patient not taking: Reported on 10/14/2017 10/01/17   Lonia Blood, MD  labetalol (NORMODYNE) 100 MG tablet Take 1 tablet (100 mg total) by mouth 2 (two) times daily. Patient not taking: Reported on 10/14/2017 10/01/17   Lonia Blood, MD  Lido-Capsaicin-Men-Methyl Sal (1ST MEDX-PATCH/ LIDOCAINE) 4-0.373-08-27 % PTCH Apply 1 patch topically 2 (two) times daily. Apply to the area left of your neck, on the top of your shoulder Patient not taking: Reported on 10/14/2017 10/09/17   Gerhard Munch, MD  lisinopril (PRINIVIL,ZESTRIL) 5 MG tablet Take 1 tablet (5 mg total) by mouth daily. Patient not taking: Reported on 10/14/2017 10/01/17  Lonia Blood, MD    Family History Family History  Problem Relation Age of Onset  . Coronary artery disease Unknown   . Diabetes Unknown   . Cancer Sister     Social History Social History   Tobacco Use  . Smoking status: Current Every Day Smoker    Packs/day: 2.00    Types: Cigarettes  . Smokeless tobacco: Never Used  Substance Use Topics  . Alcohol use: Yes    Comment: Heavy. 5-10 40oz daily  . Drug use: No    Types: Marijuana, Cocaine    Comment: Marijuana and cocaine, crack     Allergies   Patient has no known allergies.   Review of Systems Review of Systems  Constitutional: Negative for fever.   Respiratory: Positive for cough and shortness of breath.   Cardiovascular: Positive for chest pain and leg swelling.  Neurological: Positive for numbness (tingling).  All other systems reviewed and are negative.    Physical Exam Updated Vital Signs BP (!) 109/57   Pulse 64   Temp 99.3 F (37.4 C) (Oral)   Resp (!) 26   Ht 5\' 7"  (1.702 m)   Wt 81.6 kg (180 lb)   SpO2 97%   BMI 28.19 kg/m   Physical Exam  Constitutional: He is oriented to person, place, and time. He appears well-developed and well-nourished.  HENT:  Head: Normocephalic and atraumatic.  Right Ear: External ear normal.  Left Ear: External ear normal.  Nose: Nose normal.  Eyes: Right eye exhibits no discharge. Left eye exhibits no discharge.  Neck: Neck supple.  Cardiovascular: Normal rate, regular rhythm and normal heart sounds.  Pulses:      Radial pulses are 2+ on the right side, and 2+ on the left side.  Pulmonary/Chest: Effort normal. He has wheezes.  Abdominal: Soft. There is no tenderness.  Musculoskeletal: He exhibits no edema.  Neurological: He is alert and oriented to person, place, and time.  5/5 strength in BUE. Normal gross sensation but he states it feels tingly when I palpate his left fingertips  Skin: Skin is warm and dry.  Nursing note and vitals reviewed.    ED Treatments / Results  Labs (all labs ordered are listed, but only abnormal results are displayed) Labs Reviewed  BASIC METABOLIC PANEL - Abnormal; Notable for the following components:      Result Value   Sodium 147 (*)    Glucose, Bld 101 (*)    Calcium 8.7 (*)    All other components within normal limits  CBC - Abnormal; Notable for the following components:   MCV 100.2 (*)    All other components within normal limits  ETHANOL - Abnormal; Notable for the following components:   Alcohol, Ethyl (B) 183 (*)    All other components within normal limits  BRAIN NATRIURETIC PEPTIDE - Abnormal; Notable for the following  components:   B Natriuretic Peptide 258.0 (*)    All other components within normal limits  I-STAT ARTERIAL BLOOD GAS, ED - Abnormal; Notable for the following components:   pH, Arterial 7.243 (*)    pCO2 arterial 70.2 (*)    pO2, Arterial 59.0 (*)    Bicarbonate 30.1 (*)    All other components within normal limits  I-STAT TROPONIN, ED    EKG EKG Interpretation  Date/Time:  Friday October 26 2017 17:33:51 EDT Ventricular Rate:  102 PR Interval:    QRS Duration: 83 QT Interval:  347 QTC Calculation: 452 R Axis:  20 Text Interpretation:  Age not entered, assumed to be  62 years old for purpose of ECG interpretation Sinus tachycardia Borderline T abnormalities, lateral leads Baseline wander in lead(s) V6 no significant change since October 16 2017 Confirmed by Pricilla Loveless [phone]) on 10/26/2017 6:00:10 PM   Radiology Dg Chest 2 View  Result Date: 10/26/2017 CLINICAL DATA:  Chest pain with shortness of breath. History of alcohol abuse. EXAM: CHEST - 2 VIEW COMPARISON:  10/14/2017. FINDINGS: Heart is enlarged. There is slight vascular congestion. No consolidation or edema. No effusion or pneumothorax. Thoracic atherosclerosis. No osseous findings. IMPRESSION: Cardiomegaly with mild vascular congestion. No consolidation or edema. Electronically Signed   By: Elsie Stain M.D.   On: 10/26/2017 18:56    Procedures .Critical Care Performed by: Pricilla Loveless, MD Authorized by: Pricilla Loveless, MD   Critical care provider statement:    Critical care time (minutes):  30   Critical care was necessary to treat or prevent imminent or life-threatening deterioration of the following conditions:  Respiratory failure   Critical care was time spent personally by me on the following activities:  Development of treatment plan with patient or surrogate, discussions with consultants, evaluation of patient's response to treatment, examination of patient, obtaining history from patient or surrogate,  ordering and performing treatments and interventions, ordering and review of laboratory studies, ordering and review of radiographic studies, pulse oximetry, re-evaluation of patient's condition, review of old charts and ventilator management   (including critical care time)  Medications Ordered in ED Medications  albuterol (PROVENTIL) (2.5 MG/3ML) 0.083% nebulizer solution 5 mg (5 mg Nebulization Given 10/26/17 1913)  ipratropium (ATROVENT) nebulizer solution 0.5 mg (0.5 mg Nebulization Given 10/26/17 1913)  methylPREDNISolone sodium succinate (SOLU-MEDROL) 125 mg/2 mL injection 125 mg (125 mg Intravenous Given 10/26/17 1913)     Initial Impression / Assessment and Plan / ED Course  I have reviewed the triage vital signs and the nursing notes.  Pertinent labs & imaging results that were available during my care of the patient were reviewed by me and considered in my medical decision making (see chart for details).     Patient's work-up shows respiratory acidosis with a pH of 7.2 and CO2 of 70.  He also has some alcohol in his system which is likely contributing to his slightly decreased mental status.  However he is easily arousable.  He will need to be placed on BiPAP and is currently hypoxic but not in distress.  He has been given an albuterol treatment.  I think this is a recurrent issue with COPD and highly doubt PE, ACS, or dissection.  He does not appear to need to be intubated.  Hospitalist to admit.  Final Clinical Impressions(s) / ED Diagnoses   Final diagnoses:  Acute respiratory failure with hypoxia and hypercapnia (HCC)  COPD exacerbation [facility])    ED Discharge Orders    None       Pricilla Loveless, MD 10/26/17 2157

## 2017-10-26 NOTE — ED Notes (Signed)
Per lab- able to add on BNP

## 2017-10-26 NOTE — H&P (Signed)
History and Physical   [facility] [place] [facility] [place] Admission History and Physical [employer], D.O.    Patient Name: Brian [name] MR#: [id number] Date of Birth: December 27, 1955 Date of Admission: 10/26/2017  Referring MD/NP/PA: Dr. Criss Alvine Primary Care Physician: Patient, [name]  Chief Complaint:  Chief Complaint  Patient presents with  . Chest Pain   Please note the entire history is obtained from the patient's emergency department chart, emergency department provider. Patient's personal history is limited by sedation/intoxication, poorhistorian  HPI: Brian [name] is a 62 y.o. male with a known history of chronic systolic CHF, COPD, tobacco and EtOH use disorder presents to the emergency department for evaluation of chest pain.  Patient was in a usual state of health until two days ago when he reports the onset of cough productive of green sputum and associated with chest pain. .  Last drink was today.   EMS/ED Course: Patient was placed on BiPap for acute respiratory failure and rec'd Solumedrol and Duoneb.  Review of Systems:  Unable to obtain 2/2 poor historian, sedation, intoxication.   Past Medical History:  Diagnosis Date  . Alcohol abuse    Heavy alcohol abuse and homelessness  . Arthritis   . CHF (congestive heart failure) (HCC)   . Cocaine abuse (HCC)    And crack   . COPD (chronic obstructive pulmonary disease) (HCC)   . Heart failure   . Homelessness   . Suicide attempt (HCC)   . Tobacco abuse     Past Surgical History:  Procedure Laterality Date  . INNER EAR SURGERY       reports that he has been smoking cigarettes.  He has been smoking about 2.00 packs per day. He has never used smokeless tobacco. He reports that he drinks alcohol. He reports that he does not use drugs.  No Known Allergies  Family History  Problem Relation Age of Onset  . Coronary artery disease Unknown   . Diabetes Unknown   . Cancer Sister      Prior to Admission medications   Medication Sig Start Date End Date Taking? Authorizing Provider  thiamine 100 MG tablet Take 1 tablet (100 mg total) by mouth daily. 10/17/17  Yes Richarda Overlie, MD  albuterol (PROVENTIL HFA;VENTOLIN HFA) 108 (90 Base) MCG/ACT inhaler Inhale 2 puffs into the lungs every 6 (six) hours as needed for wheezing or shortness of breath. Patient not taking: Reported on 10/14/2017 10/01/17   Lonia Blood, MD  aspirin EC 81 MG EC tablet Take 1 tablet (81 mg total) by mouth daily. Patient not taking: Reported on 10/14/2017 10/02/17   Lonia Blood, MD  furosemide (LASIX) 20 MG tablet Take 3 tablets (60 mg total) by mouth 2 (two) times daily. Patient not taking: Reported on 10/14/2017 10/01/17   Lonia Blood, MD  labetalol (NORMODYNE) 100 MG tablet Take 1 tablet (100 mg total) by mouth 2 (two) times daily. Patient not taking: Reported on 10/14/2017 10/01/17   Lonia Blood, MD  Lido-Capsaicin-Men-Methyl Sal (1ST MEDX-PATCH/ LIDOCAINE) 4-0.373-08-27 % PTCH Apply 1 patch topically 2 (two) times daily. Apply to the area left of your neck, on the top of your shoulder Patient not taking: Reported on 10/14/2017 10/09/17   Gerhard Munch, MD  lisinopril (PRINIVIL,ZESTRIL) 5 MG tablet Take 1 tablet (5 mg total) by mouth daily. Patient not taking: Reported on 10/14/2017 10/01/17   Lonia Blood, MD    Physical Exam: Vitals:   10/26/17 2132  10/26/17 2133 10/26/17 2134 10/26/17 2135  BP:      Pulse: 63 64 63 64  Resp:      Temp:      TempSrc:      SpO2: 97% 97% 97% 97%  Weight:      Height:        GENERAL: 62 y.o.-year-old male patient, disheveled, lying in the bed in no acute distress.  BiPap in place.   HEENT: Head atraumatic, normocephalic. Pupils equal. Mucus membranes moist. NECK: Supple. No JVD. CHEST: Normal breath sounds bilaterally. Minimal diffuse expiratory wheezing.  No use of accessory muscles of respiration.  No reproducible chest wall  tenderness.  CARDIOVASCULAR: S1, S2 normal. No murmurs, rubs, or gallops. Cap refill <2 seconds. Pulses intact distally.  ABDOMEN: Soft, nondistended, nontender. No rebound, guarding, rigidity. Normoactive bowel sounds present in all four quadrants.  EXTREMITIES: No pedal edema, cyanosis, or clubbing. No calf tenderness or Homan's sign.  NEUROLOGIC: The patient is arousable but somnolent. SKIN: Warm, dry, and intact without obvious rash, lesion, or ulcer.    Labs on Admission:  CBC: Recent Labs  Lab 10/26/17 1805  WBC 5.3  HGB 15.3  HCT 49.3  MCV 100.2*  PLT 206   Basic Metabolic Panel: Recent Labs  Lab 10/26/17 1805  NA 147*  K 4.1  CL 109  CO2 29  GLUCOSE 101*  BUN 14  CREATININE 1.00  CALCIUM 8.7*   GFR: Estimated Creatinine Clearance: 81.3 mL/min (by C-G formula based on SCr of 1 mg/dL). Liver Function Tests: No results for input(s): AST, ALT, ALKPHOS, BILITOT, PROT, ALBUMIN in the last 168 hours. No results for input(s): LIPASE, AMYLASE in the last 168 hours. No results for input(s): AMMONIA in the last 168 hours. Coagulation Profile: No results for input(s): INR, PROTIME in the last 168 hours. Cardiac Enzymes: No results for input(s): CKTOTAL, CKMB, CKMBINDEX, TROPONINI in the last 168 hours. BNP (last 3 results) No results for input(s): PROBNP in the last 8760 hours. HbA1C: No results for input(s): HGBA1C in the last 72 hours. CBG: No results for input(s): GLUCAP in the last 168 hours. Lipid Profile: No results for input(s): CHOL, HDL, LDLCALC, TRIG, CHOLHDL, LDLDIRECT in the last 72 hours. Thyroid Function Tests: No results for input(s): TSH, T4TOTAL, FREET4, T3FREE, THYROIDAB in the last 72 hours. Anemia Panel: No results for input(s): VITAMINB12, FOLATE, FERRITIN, TIBC, IRON, RETICCTPCT in the last 72 hours. Urine analysis:    Component Value Date/Time   COLORURINE Yellow 10/10/2012 0320   COLORURINE YELLOW 08/27/2012 0659   APPEARANCEUR Clear  10/10/2012 0320   LABSPEC 1.009 10/10/2012 0320   PHURINE 5.0 10/10/2012 0320   PHURINE 5.5 08/27/2012 0659   GLUCOSEU Negative 10/10/2012 0320   HGBUR Negative 10/10/2012 0320   HGBUR NEGATIVE 08/27/2012 0659   BILIRUBINUR Negative 10/10/2012 0320   KETONESUR Negative 10/10/2012 0320   KETONESUR NEGATIVE 08/27/2012 0659   PROTEINUR Negative 10/10/2012 0320   PROTEINUR NEGATIVE 08/27/2012 0659   UROBILINOGEN 0.2 08/27/2012 0659   NITRITE Negative 10/10/2012 0320   NITRITE NEGATIVE 08/27/2012 0659   LEUKOCYTESUR Negative 10/10/2012 0320   Sepsis Labs: @LABRCNTIP (procalcitonin:4,lacticidven:4) )No results found for this or any previous visit (from the past 240 hour(s)).   Radiological Exams on Admission: Dg Chest 2 View  Result Date: 10/26/2017 CLINICAL DATA:  Chest pain with shortness of breath. History of alcohol abuse. EXAM: CHEST - 2 VIEW COMPARISON:  10/14/2017. FINDINGS: Heart is enlarged. There is slight vascular congestion. No consolidation or edema.  No effusion or pneumothorax. Thoracic atherosclerosis. No osseous findings. IMPRESSION: Cardiomegaly with mild vascular congestion. No consolidation or edema. Electronically Signed   By: Elsie Stain M.D.   On: 10/26/2017 18:56    EKG: Sinus tach at 102 bpm with normal axis and nonspecific ST-T wave changes.   Assessment/Plan  This is a 62 y.o. male with a history of chronic systolic CHF, COPD, tobacco and EtOH use disorder now being admitted with:  #. Acute respiratory failure 2/2 exacerbation of COPD - IV steroids and azithromycin - Nebulizers, O2 therapy and expectorants as needed.  - Continuous pulse oximetry  #. EtOH use disorder - CIWA protocol - Banana bag - Social work  #. History of CHF - Resume Lasix  #. History of HTN - Continue labetalol, lisinopril  Admission status: Inpatient stepdown IV Fluids: HL Diet/Nutrition: Heart healthy Consults called: Social work DVT Px: Lovenox, SCDs and early  ambulation. Code Status: Full Code  Disposition Plan: TBD  All the records are reviewed and case discussed with ED provider. Management plans discussed with the patient and/or family who express understanding and agree with plan of care.  Parris Cudworth D.O. on 10/26/2017 at 9:39 PM CC: Primary care physician; Patient, [name]   10/26/2017, 9:39 PM

## 2017-10-26 NOTE — ED Triage Notes (Addendum)
Pt BIB ems with reports of CP/SOB onset several days ago. Pt with hx of "hole in his heart" and COPD. BP 134/76, HR 111, 86% RA improved to 94% on 4L [place]. Pt speaking in complete sentences, in NAD.  Given 324mg  aspirin en route.

## 2017-10-26 NOTE — Progress Notes (Signed)
Critical ABG results given to RN.  

## 2017-10-27 DIAGNOSIS — F1020 Alcohol dependence, uncomplicated: Secondary | ICD-10-CM

## 2017-10-27 LAB — CREATININE, SERUM
Creatinine, Ser: 0.83 mg/dL (ref 0.61–1.24)
GFR calc Af Amer: >60 mL/min (ref >60)
GFR calc non Af Amer: >60 mL/min (ref >60)

## 2017-10-27 LAB — CBC
HCT: 51.2 % (ref 39.0–52.0)
HEMATOCRIT: 51.1 % (ref 39.0–52.0)
HEMOGLOBIN: 15.7 g/dL (ref 13.0–17.0)
Hemoglobin: 15.5 g/dL (ref 13.0–17.0)
MCH: 30.6 pg (ref 26.0–34.0)
MCH: 30.8 pg (ref 26.0–34.0)
MCHC: 30.3 g/dL (ref 30.0–36.0)
MCHC: 30.7 g/dL (ref 30.0–36.0)
MCV: 100.4 fL — AB (ref 78.0–100.0)
MCV: 100.8 fL — AB (ref 78.0–100.0)
Platelets: 190 10*3/uL (ref 150–400)
Platelets: 193 10*3/uL (ref 150–400)
RBC: 5.07 MIL/uL (ref 4.22–5.81)
RBC: 5.1 MIL/uL (ref 4.22–5.81)
RDW: 14.4 % (ref 11.5–15.5)
RDW: 14.5 % (ref 11.5–15.5)
WBC: 4.6 10*3/uL (ref 4.0–10.5)
WBC: 4.7 10*3/uL (ref 4.0–10.5)

## 2017-10-27 LAB — COMPREHENSIVE METABOLIC PANEL
ALK PHOS: 38 U/L (ref 38–126)
ALT: 57 U/L — ABNORMAL HIGH (ref 0–44)
ANION GAP: 11 (ref 5–15)
AST: 49 U/L — ABNORMAL HIGH (ref 15–41)
Albumin: 3.8 g/dL (ref 3.5–5.0)
BILIRUBIN TOTAL: 0.5 mg/dL (ref 0.3–1.2)
BUN: 16 mg/dL (ref 6–20)
CALCIUM: 8.6 mg/dL — AB (ref 8.9–10.3)
CO2: 25 mmol/L (ref 22–32)
Chloride: 109 mmol/L (ref 98–111)
Creatinine, Ser: 0.78 mg/dL (ref 0.61–1.24)
GFR calc Af Amer: >60 mL/min (ref >60)
Glucose, Bld: 144 mg/dL — ABNORMAL HIGH (ref 70–99)
POTASSIUM: 4.8 mmol/L (ref 3.5–5.1)
Sodium: 145 mmol/L (ref 135–145)
TOTAL PROTEIN: 6.5 g/dL (ref 6.5–8.1)

## 2017-10-27 LAB — GLUCOSE, CAPILLARY: GLUCOSE-CAPILLARY: 141 mg/dL — AB (ref 70–99)

## 2017-10-27 LAB — MAGNESIUM: Magnesium: 2.1 mg/dL (ref 1.7–2.4)

## 2017-10-27 LAB — MRSA PCR SCREENING: MRSA BY PCR: NEGATIVE

## 2017-10-27 LAB — PHOSPHORUS: Phosphorus: 3.4 mg/dL (ref 2.5–4.6)

## 2017-10-27 MED ORDER — IPRATROPIUM-ALBUTEROL 0.5-2.5 (3) MG/3ML IN SOLN
3.0000 mL | Freq: Three times a day (TID) | RESPIRATORY_TRACT | Status: DC
  Administered 2017-10-27: 3 mL via RESPIRATORY_TRACT
  Filled 2017-10-27: qty 3

## 2017-10-27 MED ORDER — HYDRALAZINE HCL 20 MG/ML IJ SOLN: 10.0000 mg | Freq: Four times a day (QID) | INTRAMUSCULAR | Status: DC | PRN

## 2017-10-27 MED ORDER — CHLORDIAZEPOXIDE HCL 5 MG PO CAPS
15.0000 mg | ORAL_CAPSULE | Freq: Three times a day (TID) | ORAL | Status: DC
  Administered 2017-10-28 (×3): 15 mg via ORAL
  Filled 2017-10-27 (×4): qty 3

## 2017-10-27 MED ORDER — CHLORDIAZEPOXIDE HCL 5 MG PO CAPS
15.0000 mg | ORAL_CAPSULE | Freq: Three times a day (TID) | ORAL | Status: DC
  Administered 2017-10-27 (×2): 15 mg via ORAL
  Filled 2017-10-27 (×2): qty 3

## 2017-10-27 MED ORDER — SENNOSIDES-DOCUSATE SODIUM 8.6-50 MG PO TABS
1.0000 | ORAL_TABLET | Freq: Every evening | ORAL | Status: DC | PRN
  Filled 2017-10-27: qty 1

## 2017-10-27 MED ORDER — IPRATROPIUM-ALBUTEROL 0.5-2.5 (3) MG/3ML IN SOLN
3.0000 mL | Freq: Two times a day (BID) | RESPIRATORY_TRACT | Status: DC
  Administered 2017-10-27 – 2017-10-28 (×2): 3 mL via RESPIRATORY_TRACT
  Filled 2017-10-27 (×3): qty 3

## 2017-10-27 MED ORDER — ISOSORBIDE MONONITRATE ER 30 MG PO TB24
30.0000 mg | ORAL_TABLET | Freq: Every day | ORAL | Status: DC
  Administered 2017-10-27 – 2017-10-28 (×2): 30 mg via ORAL
  Filled 2017-10-27 (×2): qty 1

## 2017-10-27 MED ORDER — FUROSEMIDE 10 MG/ML IJ SOLN
40.0000 mg | Freq: Two times a day (BID) | INTRAMUSCULAR | Status: DC
  Administered 2017-10-27 – 2017-10-28 (×3): 40 mg via INTRAVENOUS
  Filled 2017-10-27 (×3): qty 4

## 2017-10-27 MED ORDER — MAGNESIUM CITRATE PO SOLN: 1.0000 | Freq: Once | ORAL | Status: DC | PRN

## 2017-10-27 MED ORDER — ENOXAPARIN SODIUM 40 MG/0.4ML ~~LOC~~ SOLN
40.0000 mg | Freq: Every day | SUBCUTANEOUS | Status: DC
  Administered 2017-10-27 – 2017-10-28 (×2): 40 mg via SUBCUTANEOUS
  Filled 2017-10-27 (×2): qty 0.4

## 2017-10-27 NOTE — Progress Notes (Signed)
PT Cancellation Note  Patient Details Name: Brian [name] MRN: [id number] DOB: Aug 30, 1955   Cancelled Treatment:    Reason Eval/Treat Not Completed: Patient's level of consciousness. Pt lethargic and unable to arouse with multimodal stimuli. PT will continue to follow up with pt acutely for evaluation.    Alessandra Bevels Nioma Mccubbins 10/27/2017, 2:44 PM

## 2017-10-27 NOTE — Progress Notes (Signed)
Pt arrived from ICU via wheelchair.  Alert and oriented.  Stepdown monitor applied, SR.  CIWA scale, just received 3mg  if Ativan from ICU.  IV to LAC.  Denis any pain.  On 4L of oxygen via [place].  Sitting up in bed, bed alarm set. Will continue to monitor

## 2017-10-27 NOTE — Progress Notes (Signed)
@IPLOG @        PROGRESS NOTE                                                                                                                                                                                                             Patient Demographics:    Brian [name], is a 62 y.o. male, DOB - 12/03/1955, UQJ:[id number]  Admit date - 10/26/2017   Admitting Physician Tonye Royalty, DO  Outpatient Primary MD for the patient is Patient, [name]  LOS - 1  Chief Complaint  Patient presents with  . Chest Pain       Brief Narrative Brian [name] is a 62 y.o. male with a known history of chronic systolic CHF, COPD, tobacco and EtOH use disorder presents to the emergency department for evaluation of chest pain.  Patient was in a usual state of health until two days ago when he reports the onset of cough productive of green sputum and associated with chest pain.   Subjective:    Brian [name] today has, No headache, No chest pain, No abdominal pain - No Nausea, No new weakness tingling or numbness, No Cough - +ve SOB and orthopnea.   Assessment  & Plan :   1.  Acute on chronic hypoxic and hypercapnic respiratory failure due to COPD exacerbation - extremely noncompliant with medications, still actively smoking, abusing alcohol and cocaine on a regular basis.  Counseled to quit all but he says he cannot stop doing that.  Now continue monitoring in stepdown, continue IV steroids, continue azithromycin, nebulizer treatments and oxygen as needed.  Currently seems to be off of BiPAP and doing somewhat better than admission.  Soft diet for now.  2.  Chronic systolic CHF with EF 35% along with diffuse hypokinesis.  Again noncompliant with medications he says he does not take any, does have trace orthopnea hence could have mild acute exacerbation, BNP was borderline and CHF did show mild interstitial edema pattern, trial of IV Lasix, will avoid beta-blockers due to active cocaine abuse,  will place on low-dose ACE inhibitor along with Imdur and monitor.  3.  Smoking, alcohol abuse, cocaine abuse.  Counseled to quit all.  Already seems to be going in DTs, placed on scheduled Librium along with CIWA protocol.  4.  Hypertension.  Placed on combination of imdur, ACE inhibitor and as needed diuretic.  ER and IV hydrated    Diet :  Diet Order           DIET SOFT Room  service appropriate? Yes; Fluid consistency: Thin  Diet effective now           Family Communication  :  None  Code Status :  Full  Disposition Plan  :  Step down  Consults  :  None  Procedures  :    TTE  09/2017 - Mildly dilated LV with EF 30-35%, diffuse hypokinesis. Mildly dilated RV with normal systolic function. No significant valvular abnormalities. Mild pulmonary hypertension.  DVT Prophylaxis  :  Lovenox    Lab Results  Component Value Date   PLT 193 10/27/2017    Inpatient Medications  Scheduled Meds: . aspirin EC  81 mg Oral Daily  . chlordiazePOXIDE  15 mg Oral TID  . dextromethorphan-guaiFENesin  1 tablet Oral BID  . enoxaparin (LOVENOX) injection  40 mg Subcutaneous Daily  . folic acid  1 mg Oral Daily  . furosemide  40 mg Intravenous BID  . ipratropium-albuterol  3 mL Nebulization BID  . labetalol  100 mg Oral BID  . lisinopril  5 mg Oral Daily  . methylPREDNISolone (SOLU-MEDROL) injection  60 mg Intravenous Q6H  . multivitamin with minerals  1 tablet Oral Daily  . thiamine  100 mg Oral Daily   Continuous Infusions: . azithromycin Stopped (10/27/17 0138)   PRN Meds:.acetaminophen **OR** acetaminophen, bisacodyl, LORazepam, magnesium citrate, ondansetron **OR** ondansetron (ZOFRAN) IV, senna-docusate  Antibiotics  :    Anti-infectives (From admission, onward)   Start     Dose/Rate Route Frequency Ordered Stop   10/26/17 2345  azithromycin (ZITHROMAX) 500 mg in sodium chloride 0.9 % 250 mL IVPB     500 mg 250 mL/hr over 60 Minutes Intravenous Every 24 hours 10/26/17  2338           Objective:   Vitals:   10/27/17 0816 10/27/17 0920 10/27/17 1000 10/27/17 1030  BP:  (!) 142/93 140/83   Pulse:  89 77 (!) 103  Resp:  20 (!) 27   Temp:      TempSrc:      SpO2: 96% 92% 94%   Weight:      Height:        Wt Readings from Last 3 Encounters:  10/27/17 83.9 kg (184 lb 15.5 oz)  10/16/17 82.7 kg (182 lb 6.4 oz)  10/01/17 81.4 kg (179 lb 7.3 oz)     Intake/Output Summary (Last 24 hours) at 10/27/2017 1046 Last data filed at 10/27/2017 0138 Gross per 24 hour  Intake 500 ml  Output -  Net 500 ml     Physical Exam  Awake Alert, Oriented X 3, No new F.N deficits, Normal affect [place].AT,PERRAL Supple Neck,No JVD, No cervical lymphadenopathy appriciated.  Symmetrical Chest wall movement, minimal air movement bilaterally, few rales RRR,No Gallops,Rubs or new Murmurs, No Parasternal Heave +ve B.Sounds, Abd Soft, No tenderness, No organomegaly appriciated, No rebound - guarding or rigidity. No Cyanosis, Clubbing or edema, No new Rash or bruise      Data Review:    CBC Recent Labs  Lab 10/26/17 1805 10/27/17 0012 10/27/17 0301  WBC 5.3 4.7 4.6  HGB 15.3 15.7 15.5  HCT 49.3 51.2 51.1  PLT 206 190 193  MCV 100.2* 100.4* 100.8*  MCH 31.1 30.8 30.6  MCHC 31.0 30.7 30.3  RDW 14.6 14.4 14.5    Chemistries  Recent Labs  Lab 10/26/17 1805 10/27/17 0012 10/27/17 0301  NA 147*  --  145  K 4.1  --  4.8  CL 109  --  109  CO2 29  --  25  GLUCOSE 101*  --  144*  BUN 14  --  16  CREATININE 1.00 0.83 0.78  CALCIUM 8.7*  --  8.6*  MG  --  2.1  --   AST  --   --  49*  ALT  --   --  57*  ALKPHOS  --   --  38  BILITOT  --   --  0.5   ------------------------------------------------------------------------------------------------------------------ No results for input(s): CHOL, HDL, LDLCALC, TRIG, CHOLHDL, LDLDIRECT in the last 72 hours.  Lab Results  Component Value Date   HGBA1C 5.1 08/27/2012    ------------------------------------------------------------------------------------------------------------------ No results for input(s): TSH, T4TOTAL, T3FREE, THYROIDAB in the last 72 hours.  Invalid input(s): FREET3 ------------------------------------------------------------------------------------------------------------------ No results for input(s): VITAMINB12, FOLATE, FERRITIN, TIBC, IRON, RETICCTPCT in the last 72 hours.  Coagulation profile No results for input(s): INR, PROTIME in the last 168 hours.  No results for input(s): DDIMER in the last 72 hours.  Cardiac Enzymes No results for input(s): CKMB, TROPONINI, MYOGLOBIN in the last 168 hours.  Invalid input(s): CK ------------------------------------------------------------------------------------------------------------------    Component Value Date/Time   BNP 258.0 (H) 10/26/2017 1920    Micro Results Recent Results (from the past 240 hour(s))  MRSA PCR Screening     Status: None   Collection Time: 10/27/17  5:10 AM  Result Value Ref Range Status   MRSA by PCR NEGATIVE NEGATIVE Final    Comment:        The GeneXpert MRSA Assay (FDA approved for NASAL specimens only), is one component of a comprehensive MRSA colonization surveillance program. It is not intended to diagnose MRSA infection nor to guide or monitor treatment for MRSA infections. Performed at [facility], [address], [place], [place] [address]     Radiology Reports Dg Chest 2 View  Result Date: 10/26/2017 CLINICAL DATA:  Chest pain with shortness of breath. History of alcohol abuse. EXAM: CHEST - 2 VIEW COMPARISON:  10/14/2017. FINDINGS: Heart is enlarged. There is slight vascular congestion. No consolidation or edema. No effusion or pneumothorax. Thoracic atherosclerosis. No osseous findings. IMPRESSION: Cardiomegaly with mild vascular congestion. No consolidation or edema. Electronically Signed   By: Elsie Stain M.D.   On:  10/26/2017 18:56   Dg Chest 2 View  Result Date: 10/14/2017 CLINICAL DATA:  Mid chest pain.  Shortness of breath. EXAM: CHEST - 2 VIEW COMPARISON:  October 09, 2016 FINDINGS: Stable cardiomegaly. The hila and mediastinum are normal. No pneumothorax. No pulmonary nodules or masses. No focal infiltrates. IMPRESSION: No active cardiopulmonary disease. Electronically Signed   By: Gerome Sam III M.D   On: 10/14/2017 16:55   Dg Hand 2 View Right  Result Date: 10/16/2017 CLINICAL DATA:  Right fourth finger pain and swelling after injury finding 3 days ago. EXAM: RIGHT HAND - 2 VIEW COMPARISON:  Radiographs of November 26, 2012. FINDINGS: No acute fracture is noted. Old healed proximal fifth metacarpal fracture is noted. 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 No other joint space abnormality is noted. No soft tissue abnormality is noted. IMPRESSION: Chronic abnormalities as described above. No acute abnormality seen in the right hand. Electronically Signed   By: Lupita Raider, M.D.   On: 10/16/2017 11:54   Dg Chest Portable 1 View  Result Date:  10/09/2017 CLINICAL DATA:  Left upper arm and left shoulder pain, some numbness in the left hand, shortness of breath, alcohol and drug abuse EXAM: PORTABLE CHEST 1 VIEW COMPARISON:  Portable chest x-ray of 09/27/2016 FINDINGS: Minimally prominent markings remain particular at the left lung base most consistent with atelectasis and possible scarring. No definite pneumonia or effusion is seen. Mediastinal and hilar contours are unremarkable and the heart is mildly enlarged and stable. No acute bony abnormality is seen. IMPRESSION: 1. No active process. 2. Mild left basilar linear atelectasis or scarring. 3. Stable mild cardiomegaly. Electronically Signed   By: Dwyane Dee M.D.   On:  10/09/2017 16:41    Time Spent in minutes  30   Susa Raring M.D on 10/27/2017 at 10:46 AM  Between 7am to 7pm - Pager - [phone] ( page via [email or web address], text pages only, please mention full 10 digit call back number). After 7pm go to [email or web address] - password [facility]

## 2017-10-28 ENCOUNTER — Encounter (HOSPITAL_COMMUNITY): Payer: Self-pay | Admitting: *Deleted

## 2017-10-28 DIAGNOSIS — I5022 Chronic systolic (congestive) heart failure: Secondary | ICD-10-CM

## 2017-10-28 DIAGNOSIS — J441 Chronic obstructive pulmonary disease with (acute) exacerbation: Secondary | ICD-10-CM

## 2017-10-28 DIAGNOSIS — J9601 Acute respiratory failure with hypoxia: Secondary | ICD-10-CM

## 2017-10-28 DIAGNOSIS — F10220 Alcohol dependence with intoxication, uncomplicated: Secondary | ICD-10-CM

## 2017-10-28 DIAGNOSIS — J9602 Acute respiratory failure with hypercapnia: Secondary | ICD-10-CM

## 2017-10-28 LAB — BASIC METABOLIC PANEL
Anion gap: 14 (ref 5–15)
BUN: 31 mg/dL — AB (ref 6–20)
CALCIUM: 9.2 mg/dL (ref 8.9–10.3)
CO2: 28 mmol/L (ref 22–32)
CREATININE: 1.07 mg/dL (ref 0.61–1.24)
Chloride: 98 mmol/L (ref 98–111)
GFR calc Af Amer: >60 mL/min (ref >60)
Glucose, Bld: 312 mg/dL — ABNORMAL HIGH (ref 70–99)
Potassium: 4.4 mmol/L (ref 3.5–5.1)
Sodium: 140 mmol/L (ref 135–145)

## 2017-10-28 LAB — HEMOGLOBIN A1C
Hgb A1c MFr Bld: 5.8 % — ABNORMAL HIGH (ref 4.8–5.6)
Mean Plasma Glucose: 119.76 mg/dL

## 2017-10-28 LAB — GLUCOSE, CAPILLARY: GLUCOSE-CAPILLARY: 133 mg/dL — AB (ref 70–99)

## 2017-10-28 LAB — MAGNESIUM: Magnesium: 1.9 mg/dL (ref 1.7–2.4)

## 2017-10-28 MED ORDER — INSULIN ASPART 100 UNIT/ML ~~LOC~~ SOLN: 0.0000 [IU] | Freq: Every day | SUBCUTANEOUS | Status: DC

## 2017-10-28 MED ORDER — FUROSEMIDE 10 MG/ML IJ SOLN: 40.0000 mg | Freq: Every day | INTRAMUSCULAR | Status: DC

## 2017-10-28 MED ORDER — AZITHROMYCIN 250 MG PO TABS: 500.0000 mg | ORAL_TABLET | Freq: Every day | ORAL | Status: DC

## 2017-10-28 MED ORDER — INSULIN ASPART 100 UNIT/ML ~~LOC~~ SOLN
0.0000 [IU] | Freq: Three times a day (TID) | SUBCUTANEOUS | Status: DC
  Administered 2017-10-28: 1 [IU] via SUBCUTANEOUS

## 2017-10-28 MED ORDER — METHYLPREDNISOLONE SODIUM SUCC 125 MG IJ SOLR
60.0000 mg | Freq: Three times a day (TID) | INTRAMUSCULAR | Status: DC
  Administered 2017-10-28: 60 mg via INTRAVENOUS
  Filled 2017-10-28: qty 2

## 2017-10-28 MED ORDER — HALOPERIDOL LACTATE 5 MG/ML IJ SOLN
5.0000 mg | Freq: Once | INTRAMUSCULAR | Status: AC
  Administered 2017-10-28: 5 mg via INTRAVENOUS
  Filled 2017-10-28: qty 1

## 2017-10-28 MED ORDER — AMLODIPINE BESYLATE 10 MG PO TABS
10.0000 mg | ORAL_TABLET | Freq: Every day | ORAL | Status: DC
  Administered 2017-10-28: 10 mg via ORAL
  Filled 2017-10-28: qty 1

## 2017-10-28 NOTE — Evaluation (Signed)
Physical Therapy Evaluation Patient Details Name: Brian [name] MRN: [id number] DOB: Nov 15, 1955 Today's Date: 10/28/2017   History of Present Illness  Pt is a 62 y.o. M with significant PMH of chronic systolic CHF, COPD, tobacco and ETOH use disorder who presents to emergency department for evaluation of chest pain.   Clinical Impression  Prior to admission, patient was homeless and presumably independent with mobility/ADL's. Currently presenting with significantly decreased functional mobility secondary to decreased safety awareness and poor balance. Declined walking but agreeable to transfer and don clothes. Patient displaying several episodes of overt loss of balance in standing when donning clothes, requiring moderate assistance by PT to return to upright. Presents as a significant fall risk and therefore recommending SNF at discharge. However, recognize that patient is currently stating desire to leave AMA. Will follow acutely.     Follow Up Recommendations SNF    Equipment Recommendations  Other (comment)(TBD)    Recommendations for Other Services OT consult     Precautions / Restrictions Precautions Precautions: Fall Restrictions Weight Bearing Restrictions: No      Mobility  Bed Mobility Overal bed mobility: Independent                Transfers Overall transfer level: Needs assistance   Transfers: Sit to/from Stand Sit to Stand: Min assist         General transfer comment: Patient requiring min assist to steady once standing and demonstrates decreased eccentric control with transition from stand to sit. x3 sit to stands throughout session.  Ambulation/Gait                Stairs            Wheelchair Mobility    Modified Rankin (Stroke Patients Only)       Balance Overall balance assessment: Needs assistance Sitting-balance support: Bilateral upper extremity supported;Feet supported Sitting balance-Leahy Scale: Good        Standing balance-Leahy Scale: Fair Standing balance comment: Able to statically stand with supervision without support, however, with dynamic balance tasks including donning pants, patient requiring up to moderate assist due to loss of balance forward and laterally                             Pertinent Vitals/Pain Pain Assessment: No/denies pain    Home Living Family/patient expects to be discharged to:: Shelter/Homeless                      Prior Function Level of Independence: Independent         Comments: Uses bus for transportation, "when he has a bus pass"     Hand Dominance        Extremity/Trunk Assessment   Upper Extremity Assessment Upper Extremity Assessment: RUE deficits/detail;LUE deficits/detail RUE Coordination: decreased fine motor(intention tremor noted) LUE Coordination: decreased fine motor(intention tremor noted)    Lower Extremity Assessment Lower Extremity Assessment: Overall WFL for tasks assessed    Cervical / Trunk Assessment Cervical / Trunk Assessment: Normal  Communication   Communication: No difficulties  Cognition Arousal/Alertness: Lethargic Behavior During Therapy: WFL for tasks assessed/performed Overall Cognitive Status: History of cognitive impairments - at baseline                                 General Comments: pt has poor safety awareness and decreased ability to make good choices  given his medical condition       General Comments General comments (skin integrity, edema, etc.): Donned shirt with set up in seated position. Donned pants in standing with moderate assistance for dynamic balance and significantly increased time.     Exercises     Assessment/Plan    PT Assessment Patient needs continued PT services  PT Problem List Decreased safety awareness;Cardiopulmonary status limiting activity;Pain       PT Treatment Interventions Gait training;Stair training;Functional mobility  training;Therapeutic activities;Therapeutic exercise;Balance training;Patient/family education    PT Goals (Current goals can be found in the Care Plan section)  Acute Rehab PT Goals Patient Stated Goal: leave AMA PT Goal Formulation: With patient Time For Goal Achievement: 11/11/17 Potential to Achieve Goals: Fair    Frequency Min 3X/week   Barriers to discharge        Co-evaluation               AM-PAC PT "6 Clicks" Daily Activity  Outcome Measure Difficulty turning over in bed (including adjusting bedclothes, sheets and blankets)?: None Difficulty moving from lying on back to sitting on the side of the bed? : None Difficulty sitting down on and standing up from a chair with arms (e.g., wheelchair, bedside commode, etc,.)?: Unable Help needed moving to and from a bed to chair (including a wheelchair)?: A Lot Help needed walking in hospital room?: A Lot Help needed climbing 3-5 steps with a railing? : A Lot 6 Click Score: 15    End of Session Equipment Utilized During Treatment: Oxygen Activity Tolerance: Other (comment)(patient only willing to stand and don clothes) Patient left: in bed;with bed alarm set;with call bell/phone within reach Nurse Communication: Mobility status PT Visit Diagnosis: Unsteadiness on feet (R26.81);Other abnormalities of gait and mobility (R26.89);Apraxia (R48.2)    Time: 6440-3474 PT Time Calculation (min) (ACUTE ONLY): 31 min   Charges:   PT Evaluation $PT Eval Moderate Complexity: 1 Mod PT Treatments $Therapeutic Activity: 8-22 mins   PT G Codes:       Laurina Bustle, PT, DPT Acute Rehabilitation Services  Pager: [phone]   Vanetta Mulders 10/28/2017, 10:06 AM

## 2017-10-28 NOTE — Progress Notes (Signed)
Pt. has been refusing step-down leads/telemetry, SpO2 finger monitor and oxygen via [place] throughout the day. Upon awakening pt refused to wear hospital gown, demanding his own clothes stating he wanted to leave AMA. Physician notified and spoke to pt providing education. Pt agreed to stay. After lunch, pt again insisted on leaving. Nurse and charge nurse both provided additional education regarding his condition. Pt agreed to stay. Throughout the afternoon, pt has been pulling off telemetry leads, SpO2, and removing Foley. Post CIWA  Assessments and medication, pt would become calm, rest and agree he should stay.

## 2017-10-28 NOTE — Plan of Care (Signed)
  Problem: Health Behavior/Discharge Planning: Goal: Ability to manage health-related needs will improve Outcome: Not Progressing  Pt has mentioned leaving the hospital AMA at least twice during the shift. Pt was encouraged to stay through the night since he is getting IV antibiotics and IV steroids. Pt reluctant to stay. Pt stood up at the edge of his bed stating he was ready to leave, pt required assistance to keep from falling. Pt was assisted back to bed, pt agreed that he would stay the night. Pt offered snacks/fluids since he stated he was hungry.  Will continue to monitor.

## 2017-10-28 NOTE — Progress Notes (Signed)
PHARMACIST - PHYSICIAN COMMUNICATION DR:   Bess Harvest CONCERNING: Antibiotic IV to Oral Route Change Policy  RECOMMENDATION: This patient is receiving Azithromycin by the intravenous route.  Based on criteria approved by the Pharmacy and Therapeutics Committee, the antibiotic(s) is/are being converted to the equivalent oral dose form(s).   DESCRIPTION: These criteria include:  Patient being treated for a respiratory tract infection, urinary tract infection, cellulitis or clostridium difficile associated diarrhea if on metronidazole  The patient is not neutropenic and does not exhibit a GI malabsorption state  The patient is eating (either orally or via tube) and/or has been taking other orally administered medications for a least 24 hours  The patient is improving clinically and has a Tmax < 100.5  If you have questions about this conversion, please contact the Pharmacy Department  []   [phone] )  [facility] []   [phone] )  [facility] [x]   [phone] )  [facility] []   [phone] )  [facility] []   [phone] )  [facility]   - Please consider length of therapy for his ongoing antibiotics as you feel appropriate for him.  Thank you,  Nadara Mustard, PharmD., MS Clinical Pharmacist Pager:  [phone] Thank you for allowing pharmacy to be part of this patients care team.

## 2017-10-28 NOTE — Progress Notes (Signed)
RN came into pts room for shift change and to do bedside report, pt resting in bed initially agreeing to stay in the hospital tonight. As RN left pts room and went into hallway, pt began taking off his telemetry leads and SpO2 finger monitor and oxygen via [place]. Pts MD has been notified of pts reluctance to stay in the hospital throughout last night and during the day. Pt is alert and orientedX4 stating he wants to leave. This RN along with patients dayshift nurses Dillard Cannon and Pincus Large educated pt on the risks of leaving the hospital in the state that he is in at this time. Pt is very unsteady on his feet and very tremulous in his hands throughout his entire  body.  Pt still insisted on leaving threatening to take his IV out himself. Veronica J removed pts R AC PIV with the catheter tip intact, site clean and dry and dressing applied. Pt was provided with $2 for the bus fare and was escorted to the main entrance by staff to prevent pt from falling on his way out of the hospital. Caswell Corwin, RN 10/28/17 7:49 PM

## 2017-10-28 NOTE — Progress Notes (Signed)
Pt continues to voice that he is leaving (AMA). Pt has been provided multiple snacks, beverages, and 2 microwaveable meals. When asked how pt will get himself home, pt states he will catch the bus. RN reminded pt that the bus does not run all night and he would have to wait until morning for the bus. Pt also mentioned he would need a bus pass. RN explained that since he is wanting to leave the hospital AMA, we would not be able to provide him with a bus pass since he has not been medically cleared to leave the hospital safely.

## 2017-10-28 NOTE — Progress Notes (Signed)
@IPLOG @        PROGRESS NOTE                                                                                                                                                                                                             Patient Demographics:    Brian [name], is a 62 y.o. male, DOB - Sep 07, 1955, ZOX:[id number]  Admit date - 10/26/2017   Admitting Physician Tonye Royalty, DO  Outpatient Primary MD for the patient is Patient, [name]  LOS - 2  Chief Complaint  Patient presents with  . Chest Pain       Brief Narrative Brian [name] is a 62 y.o. male with a known history of chronic systolic CHF, COPD, tobacco and EtOH use disorder presents to the emergency department for evaluation of chest pain.  Patient was in a usual state of health until two days ago when he reports the onset of cough productive of green sputum and associated with chest pain.   Subjective:   Patient in bed, appears comfortable, denies any headache, no fever, no chest pain or pressure, improved orthopnea and shortness of breath , no abdominal pain. No focal weakness.  He refuses to be compliant with telemetry.  Says he will leave if the leads are left on.   Assessment  & Plan :   1.  Acute on chronic hypoxic and hypercapnic respiratory failure due to COPD exacerbation + #2 below - extremely noncompliant with medications, still actively smoking, abusing alcohol and cocaine on a regular basis.  Counseled to quit all but he says he cannot stop doing that.  Initially required BiPAP, now much improved with IV steroids, azithromycin, nebulizer treatment and oxygen.  Currently off of BiPAP.  Continue soft diet.  Continue treatment for CHF as below.  2.  Chronic systolic CHF with EF 35% along with diffuse hypokinesis.  Again noncompliant with medications he says he does not take any, does have trace orthopnea hence could have mild acute exacerbation, BNP was borderline and CHF did show mild interstitial  edema pattern, he is clinically much improved after 3 doses of IV Lasix, reduce dose and continue, will avoid beta-blockers due to active cocaine abuse, have added low-dose ACE inhibitor along with Imdur and monitor.  He wanted to leave AMA if telemetry leads were left on, he refuses to wear them, will discontinue telemetry leads as long as he stays in the hospital in a monitored setting.  He understands the risks and benefits.  3.  Smoking, alcohol  abuse, cocaine abuse.  Counseled to quit all.  Already seems to be going in DTs, placed on scheduled Librium along with CIWA protocol.  4.  Hypertension.  Placed on combination of Norvasc, Imdur, ACE inhibitor and as needed diuretic.  Blood pressure better continue to monitor.    Diet :  Diet Order           DIET SOFT Room service appropriate? Yes; Fluid consistency: Thin  Diet effective now           Family Communication  :  None  Code Status :  Full  Disposition Plan  :  Step down  Consults  :  None  Procedures  :    TTE  09/2017 - Mildly dilated LV with EF 30-35%, diffuse hypokinesis. Mildly dilated RV with normal systolic function. No significant valvular abnormalities. Mild pulmonary hypertension.  DVT Prophylaxis  :  Lovenox    Lab Results  Component Value Date   PLT 193 10/27/2017    Inpatient Medications  Scheduled Meds: . amLODipine  10 mg Oral Daily  . aspirin EC  81 mg Oral Daily  . [START ON 10/29/2017] azithromycin  500 mg Oral Daily  . chlordiazePOXIDE  15 mg Oral TID  . dextromethorphan-guaiFENesin  1 tablet Oral BID  . enoxaparin (LOVENOX) injection  40 mg Subcutaneous Daily  . folic acid  1 mg Oral Daily  . [START ON 10/29/2017] furosemide  40 mg Intravenous Daily  . insulin aspart  0-5 Units Subcutaneous QHS  . insulin aspart  0-9 Units Subcutaneous TID WC  . ipratropium-albuterol  3 mL Nebulization BID  . isosorbide mononitrate  30 mg Oral Daily  . lisinopril  5 mg Oral Daily  . methylPREDNISolone  (SOLU-MEDROL) injection  60 mg Intravenous Q8H  . multivitamin with minerals  1 tablet Oral Daily  . thiamine  100 mg Oral Daily   Continuous Infusions:  PRN Meds:.acetaminophen **OR** acetaminophen, bisacodyl, hydrALAZINE, LORazepam, magnesium citrate, ondansetron **OR** ondansetron (ZOFRAN) IV, senna-docusate  Antibiotics  :    Anti-infectives (From admission, onward)   Start     Dose/Rate Route Frequency Ordered Stop   10/29/17 1000  azithromycin (ZITHROMAX) tablet 500 mg     500 mg Oral Daily 10/28/17 0755     10/26/17 2345  azithromycin (ZITHROMAX) 500 mg in sodium chloride 0.9 % 250 mL IVPB  Status:  Discontinued     500 mg 250 mL/hr over 60 Minutes Intravenous Every 24 hours 10/26/17 2338 10/28/17 0755         Objective:   Vitals:   10/27/17 2104 10/27/17 2300 10/28/17 0403 10/28/17 0746  BP: 118/80 120/75 134/86   Pulse: 96 77 73   Resp:  (!) 24 (!) 25   Temp:  98.5 F (36.9 C) 97.7 F (36.5 C)   TempSrc:  Oral Oral   SpO2:  100% 96% 98%  Weight:      Height:        Wt Readings from Last 3 Encounters:  10/27/17 83.9 kg (184 lb 15.5 oz)  10/16/17 82.7 kg (182 lb 6.4 oz)  10/01/17 81.4 kg (179 lb 7.3 oz)     Intake/Output Summary (Last 24 hours) at 10/28/2017 1011 Last data filed at 10/28/2017 0850 Gross per 24 hour  Intake 840 ml  Output 3400 ml  Net -2560 ml     Physical Exam  Awake Alert, Oriented X 2, No new F.N deficits, Normal affect [place].AT,PERRAL Supple Neck,No JVD, No cervical lymphadenopathy appriciated.  Symmetrical Chest wall movement, moderate air movement bilaterally, few wheezes and rails RRR,No Gallops, Rubs or new Murmurs, No Parasternal Heave +ve B.Sounds, Abd Soft, No tenderness, No organomegaly appriciated, No rebound - guarding or rigidity. No Cyanosis, Clubbing or edema, No new Rash or bruise   Data Review:    CBC Recent Labs  Lab 10/26/17 1805 10/27/17 0012 10/27/17 0301  WBC 5.3 4.7 4.6  HGB 15.3 15.7 15.5  HCT 49.3  51.2 51.1  PLT 206 190 193  MCV 100.2* 100.4* 100.8*  MCH 31.1 30.8 30.6  MCHC 31.0 30.7 30.3  RDW 14.6 14.4 14.5    Chemistries  Recent Labs  Lab 10/26/17 1805 10/27/17 0012 10/27/17 0301 10/28/17 0206  NA 147*  --  145 140  K 4.1  --  4.8 4.4  CL 109  --  109 98  CO2 29  --  25 28  GLUCOSE 101*  --  144* 312*  BUN 14  --  16 31*  CREATININE 1.00 0.83 0.78 1.07  CALCIUM 8.7*  --  8.6* 9.2  MG  --  2.1  --  1.9  AST  --   --  49*  --   ALT  --   --  57*  --   ALKPHOS  --   --  38  --   BILITOT  --   --  0.5  --    ------------------------------------------------------------------------------------------------------------------ No results for input(s): CHOL, HDL, LDLCALC, TRIG, CHOLHDL, LDLDIRECT in the last 72 hours.  Lab Results  Component Value Date   HGBA1C 5.1 08/27/2012   ------------------------------------------------------------------------------------------------------------------ No results for input(s): TSH, T4TOTAL, T3FREE, THYROIDAB in the last 72 hours.  Invalid input(s): FREET3 ------------------------------------------------------------------------------------------------------------------ No results for input(s): VITAMINB12, FOLATE, FERRITIN, TIBC, IRON, RETICCTPCT in the last 72 hours.  Coagulation profile No results for input(s): INR, PROTIME in the last 168 hours.  No results for input(s): DDIMER in the last 72 hours.  Cardiac Enzymes No results for input(s): CKMB, TROPONINI, MYOGLOBIN in the last 168 hours.  Invalid input(s): CK ------------------------------------------------------------------------------------------------------------------    Component Value Date/Time   BNP 258.0 (H) 10/26/2017 1920    Micro Results Recent Results (from the past 240 hour(s))  MRSA PCR Screening     Status: None   Collection Time: 10/27/17  5:10 AM  Result Value Ref Range Status   MRSA by PCR NEGATIVE NEGATIVE Final    Comment:        The  GeneXpert MRSA Assay (FDA approved for NASAL specimens only), is one component of a comprehensive MRSA colonization surveillance program. It is not intended to diagnose MRSA infection nor to guide or monitor treatment for MRSA infections. Performed at [facility], [address] [place], [place] [address]     Radiology Reports Dg Chest 2 View  Result Date: 10/26/2017 CLINICAL DATA:  Chest pain with shortness of breath. History of alcohol abuse. EXAM: CHEST - 2 VIEW COMPARISON:  10/14/2017. FINDINGS: Heart is enlarged. There is slight vascular congestion. No consolidation or edema. No effusion or pneumothorax. Thoracic atherosclerosis. No osseous findings. IMPRESSION: Cardiomegaly with mild vascular congestion. No consolidation or edema. Electronically Signed   By: Elsie Stain M.D.   On: 10/26/2017 18:56   Dg Chest 2 View  Result Date: 10/14/2017 CLINICAL DATA:  Mid chest pain.  Shortness of breath. EXAM: CHEST - 2 VIEW COMPARISON:  October 09, 2016 FINDINGS: Stable cardiomegaly. The hila and mediastinum are normal. No pneumothorax. No pulmonary nodules or masses. No  focal infiltrates. IMPRESSION: No active cardiopulmonary disease. Electronically Signed   By: Gerome Sam III M.D   On: 10/14/2017 16:55   Dg Hand 2 View Right  Result Date: 10/16/2017 CLINICAL DATA:  Right fourth finger pain and swelling after injury finding 3 days ago. EXAM: RIGHT HAND - 2 VIEW COMPARISON:  Radiographs of November 26, 2012. FINDINGS: No acute fracture is noted. Old healed proximal fifth metacarpal fracture is noted. 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 No other joint space abnormality is noted. No soft tissue abnormality is noted. IMPRESSION: Chronic abnormalities as described above. No acute abnormality seen in the  right hand. Electronically Signed   By: Lupita Raider, M.D.   On: 10/16/2017 11:54   Dg Chest Portable 1 View  Result Date: 10/09/2017 CLINICAL DATA:  Left upper arm and left shoulder pain, some numbness in the left hand, shortness of breath, alcohol and drug abuse EXAM: PORTABLE CHEST 1 VIEW COMPARISON:  Portable chest x-ray of 09/27/2016 FINDINGS: Minimally prominent markings remain particular at the left lung base most consistent with atelectasis and possible scarring. No definite pneumonia or effusion is seen. Mediastinal and hilar contours are unremarkable and the heart is mildly enlarged and stable. No acute bony abnormality is seen. IMPRESSION: 1. No active process. 2. Mild left basilar linear atelectasis or scarring. 3. Stable mild cardiomegaly. Electronically Signed   By: Dwyane Dee M.D.   On: 10/09/2017 16:41    Time Spent in minutes  30   Susa Raring M.D on 10/28/2017 at 10:11 AM  To page go to [email or web address] - password [email or web address]

## 2017-11-03 ENCOUNTER — Inpatient Hospital Stay (HOSPITAL_COMMUNITY)
Admission: EM | Admit: 2017-11-03 | Discharge: 2017-11-06 | DRG: 291 | Disposition: A | Discharge status: Home / Self Care | Payer: Medicaid Other | Attending: Internal Medicine | Admitting: Internal Medicine

## 2017-11-03 ENCOUNTER — Other Ambulatory Visit: Payer: Self-pay

## 2017-11-03 ENCOUNTER — Emergency Department (HOSPITAL_COMMUNITY): Payer: Medicaid Other

## 2017-11-03 ENCOUNTER — Encounter (HOSPITAL_COMMUNITY): Payer: Self-pay

## 2017-11-03 DIAGNOSIS — I255 Ischemic cardiomyopathy: Secondary | ICD-10-CM | POA: Diagnosis present

## 2017-11-03 DIAGNOSIS — I25118 Atherosclerotic heart disease of native coronary artery with other forms of angina pectoris: Secondary | ICD-10-CM | POA: Diagnosis present

## 2017-11-03 DIAGNOSIS — I472 Ventricular tachycardia: Secondary | ICD-10-CM | POA: Diagnosis present

## 2017-11-03 DIAGNOSIS — F14188 Cocaine abuse with other cocaine-induced disorder: Secondary | ICD-10-CM | POA: Diagnosis present

## 2017-11-03 DIAGNOSIS — J9621 Acute and chronic respiratory failure with hypoxia: Secondary | ICD-10-CM | POA: Diagnosis present

## 2017-11-03 DIAGNOSIS — F419 Anxiety disorder, unspecified: Secondary | ICD-10-CM | POA: Diagnosis present

## 2017-11-03 DIAGNOSIS — F17210 Nicotine dependence, cigarettes, uncomplicated: Secondary | ICD-10-CM | POA: Diagnosis present

## 2017-11-03 DIAGNOSIS — Z638 Other specified problems related to primary support group: Secondary | ICD-10-CM

## 2017-11-03 DIAGNOSIS — F1020 Alcohol dependence, uncomplicated: Secondary | ICD-10-CM | POA: Diagnosis present

## 2017-11-03 DIAGNOSIS — R0602 Shortness of breath: Secondary | ICD-10-CM

## 2017-11-03 DIAGNOSIS — F1120 Opioid dependence, uncomplicated: Secondary | ICD-10-CM | POA: Diagnosis present

## 2017-11-03 DIAGNOSIS — Z5900 Homelessness unspecified: Secondary | ICD-10-CM

## 2017-11-03 DIAGNOSIS — R072 Precordial pain: Secondary | ICD-10-CM

## 2017-11-03 DIAGNOSIS — F1929 Other psychoactive substance dependence with unspecified psychoactive substance-induced disorder: Secondary | ICD-10-CM | POA: Diagnosis present

## 2017-11-03 DIAGNOSIS — I272 Pulmonary hypertension, unspecified: Secondary | ICD-10-CM | POA: Diagnosis present

## 2017-11-03 DIAGNOSIS — Z7189 Other specified counseling: Secondary | ICD-10-CM

## 2017-11-03 DIAGNOSIS — F1920 Other psychoactive substance dependence, uncomplicated: Secondary | ICD-10-CM | POA: Diagnosis present

## 2017-11-03 DIAGNOSIS — J441 Chronic obstructive pulmonary disease with (acute) exacerbation: Secondary | ICD-10-CM | POA: Diagnosis present

## 2017-11-03 DIAGNOSIS — Z560 Unemployment, unspecified: Secondary | ICD-10-CM

## 2017-11-03 DIAGNOSIS — D7589 Other specified diseases of blood and blood-forming organs: Secondary | ICD-10-CM | POA: Diagnosis present

## 2017-11-03 DIAGNOSIS — Z515 Encounter for palliative care: Secondary | ICD-10-CM | POA: Diagnosis present

## 2017-11-03 DIAGNOSIS — R739 Hyperglycemia, unspecified: Secondary | ICD-10-CM | POA: Diagnosis present

## 2017-11-03 DIAGNOSIS — I5023 Acute on chronic systolic (congestive) heart failure: Secondary | ICD-10-CM | POA: Diagnosis present

## 2017-11-03 DIAGNOSIS — Z9119 Patient's noncompliance with other medical treatment and regimen: Secondary | ICD-10-CM | POA: Diagnosis not present

## 2017-11-03 DIAGNOSIS — I7389 Other specified peripheral vascular diseases: Secondary | ICD-10-CM | POA: Diagnosis present

## 2017-11-03 DIAGNOSIS — Z9114 Patient's other noncompliance with medication regimen: Secondary | ICD-10-CM

## 2017-11-03 DIAGNOSIS — R7989 Other specified abnormal findings of blood chemistry: Secondary | ICD-10-CM | POA: Diagnosis present

## 2017-11-03 DIAGNOSIS — I509 Heart failure, unspecified: Secondary | ICD-10-CM

## 2017-11-03 DIAGNOSIS — F1994 Other psychoactive substance use, unspecified with psychoactive substance-induced mood disorder: Secondary | ICD-10-CM | POA: Diagnosis present

## 2017-11-03 DIAGNOSIS — R9431 Abnormal electrocardiogram [ECG] [EKG]: Secondary | ICD-10-CM | POA: Diagnosis present

## 2017-11-03 DIAGNOSIS — F17200 Nicotine dependence, unspecified, uncomplicated: Secondary | ICD-10-CM | POA: Diagnosis present

## 2017-11-03 DIAGNOSIS — R7303 Prediabetes: Secondary | ICD-10-CM | POA: Diagnosis present

## 2017-11-03 DIAGNOSIS — D72829 Elevated white blood cell count, unspecified: Secondary | ICD-10-CM | POA: Diagnosis present

## 2017-11-03 DIAGNOSIS — Z915 Personal history of self-harm: Secondary | ICD-10-CM

## 2017-11-03 DIAGNOSIS — F1410 Cocaine abuse, uncomplicated: Secondary | ICD-10-CM | POA: Diagnosis present

## 2017-11-03 HISTORY — DX: Patient's noncompliance with other medical treatment and regimen: Z91.19

## 2017-11-03 HISTORY — DX: Cardiomyopathy, unspecified: I42.9

## 2017-11-03 HISTORY — DX: Ventricular tachycardia, unspecified: I47.20

## 2017-11-03 HISTORY — DX: Patient's noncompliance with other medical treatment and regimen due to unspecified reason: Z91.199

## 2017-11-03 HISTORY — DX: Ventricular tachycardia: I47.2

## 2017-11-03 HISTORY — DX: Other ventricular tachycardia: I47.29

## 2017-11-03 LAB — URINALYSIS, ROUTINE W REFLEX MICROSCOPIC
Bilirubin Urine: NEGATIVE
GLUCOSE, UA: NEGATIVE mg/dL
Hgb urine dipstick: NEGATIVE
Ketones, ur: NEGATIVE mg/dL
Leukocytes, UA: NEGATIVE
Nitrite: NEGATIVE
PH: 5 (ref 5.0–8.0)
Protein, ur: NEGATIVE mg/dL
SPECIFIC GRAVITY, URINE: 1.003 — AB (ref 1.005–1.030)

## 2017-11-03 LAB — CBC
HCT: 48.6 % (ref 39.0–52.0)
Hemoglobin: 15 g/dL (ref 13.0–17.0)
MCH: 31 pg (ref 26.0–34.0)
MCHC: 30.9 g/dL (ref 30.0–36.0)
MCV: 100.4 fL — ABNORMAL HIGH (ref 78.0–100.0)
PLATELETS: 178 10*3/uL (ref 150–400)
RBC: 4.84 MIL/uL (ref 4.22–5.81)
RDW: 14 % (ref 11.5–15.5)
WBC: 12 10*3/uL — ABNORMAL HIGH (ref 4.0–10.5)

## 2017-11-03 LAB — I-STAT VENOUS BLOOD GAS, ED
ACID-BASE EXCESS: 2 mmol/L (ref 0.0–2.0)
Bicarbonate: 29.5 mmol/L — ABNORMAL HIGH (ref 20.0–28.0)
O2 Saturation: 86 %
PH VEN: 7.328 (ref 7.250–7.430)
TCO2: 31 mmol/L (ref 22–32)
pCO2, Ven: 56.2 mmHg (ref 44.0–60.0)
pO2, Ven: 56 mmHg — ABNORMAL HIGH (ref 32.0–45.0)

## 2017-11-03 LAB — BRAIN NATRIURETIC PEPTIDE: B NATRIURETIC PEPTIDE 5: 761.8 pg/mL — AB (ref 0.0–100.0)

## 2017-11-03 LAB — HEPATIC FUNCTION PANEL
ALT: 69 U/L — ABNORMAL HIGH (ref 0–44)
AST: 42 U/L — ABNORMAL HIGH (ref 15–41)
Albumin: 3.2 g/dL — ABNORMAL LOW (ref 3.5–5.0)
Alkaline Phosphatase: 40 U/L (ref 38–126)
BILIRUBIN DIRECT: 0.2 mg/dL (ref 0.0–0.2)
BILIRUBIN INDIRECT: 0.5 mg/dL (ref 0.3–0.9)
BILIRUBIN TOTAL: 0.7 mg/dL (ref 0.3–1.2)
Total Protein: 5.9 g/dL — ABNORMAL LOW (ref 6.5–8.1)

## 2017-11-03 LAB — BASIC METABOLIC PANEL
ANION GAP: 9 (ref 5–15)
BUN: 13 mg/dL (ref 6–20)
CALCIUM: 8.3 mg/dL — AB (ref 8.9–10.3)
CO2: 27 mmol/L (ref 22–32)
Chloride: 107 mmol/L (ref 98–111)
Creatinine, Ser: 0.82 mg/dL (ref 0.61–1.24)
GFR calc Af Amer: >60 mL/min (ref >60)
GFR calc non Af Amer: >60 mL/min (ref >60)
GLUCOSE: 197 mg/dL — AB (ref 70–99)
Potassium: 3.7 mmol/L (ref 3.5–5.1)
Sodium: 143 mmol/L (ref 135–145)

## 2017-11-03 LAB — HEMOGLOBIN A1C
Hgb A1c MFr Bld: 5.7 % — ABNORMAL HIGH (ref 4.8–5.6)
Mean Plasma Glucose: 116.89 mg/dL

## 2017-11-03 LAB — RAPID URINE DRUG SCREEN, HOSP PERFORMED
Amphetamines: NOT DETECTED
BARBITURATES: NOT DETECTED
BENZODIAZEPINES: POSITIVE — AB
COCAINE: POSITIVE — AB
Opiates: NOT DETECTED
TETRAHYDROCANNABINOL: POSITIVE — AB

## 2017-11-03 LAB — D-DIMER, QUANTITATIVE (NOT AT ARMC): D DIMER QUANT: 0.39 ug{FEU}/mL (ref 0.00–0.50)

## 2017-11-03 LAB — I-STAT TROPONIN, ED: TROPONIN I, POC: 0.01 ng/mL (ref 0.00–0.08)

## 2017-11-03 MED ORDER — VITAMIN B-1 100 MG PO TABS
100.0000 mg | ORAL_TABLET | Freq: Every day | ORAL | Status: DC
  Administered 2017-11-04 – 2017-11-06 (×3): 100 mg via ORAL
  Filled 2017-11-03 (×3): qty 1

## 2017-11-03 MED ORDER — ALBUTEROL SULFATE (2.5 MG/3ML) 0.083% IN NEBU: 2.5000 mg | INHALATION_SOLUTION | Freq: Four times a day (QID) | RESPIRATORY_TRACT | Status: DC

## 2017-11-03 MED ORDER — FOLIC ACID 1 MG PO TABS
1.0000 mg | ORAL_TABLET | Freq: Every day | ORAL | Status: DC
  Administered 2017-11-04 – 2017-11-06 (×3): 1 mg via ORAL
  Filled 2017-11-03 (×3): qty 1

## 2017-11-03 MED ORDER — ENSURE ENLIVE PO LIQD
237.0000 mL | Freq: Two times a day (BID) | ORAL | Status: DC
  Administered 2017-11-04: 237 mL via ORAL

## 2017-11-03 MED ORDER — SODIUM CHLORIDE 0.9 % IV SOLN: 250.0000 mL | INTRAVENOUS | Status: DC | PRN

## 2017-11-03 MED ORDER — IPRATROPIUM BROMIDE 0.02 % IN SOLN: 0.5000 mg | Freq: Four times a day (QID) | RESPIRATORY_TRACT | Status: DC

## 2017-11-03 MED ORDER — ACETAMINOPHEN 650 MG RE SUPP: 650.0000 mg | Freq: Four times a day (QID) | RECTAL | Status: DC | PRN

## 2017-11-03 MED ORDER — DOCUSATE SODIUM 100 MG PO CAPS
100.0000 mg | ORAL_CAPSULE | Freq: Two times a day (BID) | ORAL | Status: DC
  Administered 2017-11-03 – 2017-11-06 (×6): 100 mg via ORAL
  Filled 2017-11-03 (×6): qty 1

## 2017-11-03 MED ORDER — ALBUTEROL (5 MG/ML) CONTINUOUS INHALATION SOLN
INHALATION_SOLUTION | RESPIRATORY_TRACT | Status: AC
  Filled 2017-11-03: qty 20

## 2017-11-03 MED ORDER — HEPARIN SODIUM (PORCINE) 5000 UNIT/ML IJ SOLN
5000.0000 [IU] | Freq: Three times a day (TID) | INTRAMUSCULAR | Status: DC
  Administered 2017-11-03 – 2017-11-05 (×5): 5000 [IU] via SUBCUTANEOUS
  Filled 2017-11-03 (×6): qty 1

## 2017-11-03 MED ORDER — THIAMINE HCL 100 MG PO TABS: 100.0000 mg | ORAL_TABLET | Freq: Every day | ORAL | Status: DC

## 2017-11-03 MED ORDER — LISINOPRIL 5 MG PO TABS
5.0000 mg | ORAL_TABLET | Freq: Every day | ORAL | Status: DC
  Administered 2017-11-04 – 2017-11-06 (×3): 5 mg via ORAL
  Filled 2017-11-03 (×3): qty 1

## 2017-11-03 MED ORDER — SODIUM CHLORIDE 0.9% FLUSH
3.0000 mL | Freq: Two times a day (BID) | INTRAVENOUS | Status: DC
  Administered 2017-11-03 – 2017-11-06 (×6): 3 mL via INTRAVENOUS

## 2017-11-03 MED ORDER — TRAZODONE HCL 50 MG PO TABS: 50.0000 mg | ORAL_TABLET | Freq: Every evening | ORAL | Status: DC | PRN

## 2017-11-03 MED ORDER — FUROSEMIDE 10 MG/ML IJ SOLN
40.0000 mg | Freq: Once | INTRAMUSCULAR | Status: AC
  Administered 2017-11-03: 40 mg via INTRAVENOUS
  Filled 2017-11-03: qty 4

## 2017-11-03 MED ORDER — PREDNISONE 20 MG PO TABS
40.0000 mg | ORAL_TABLET | Freq: Every day | ORAL | Status: DC
  Administered 2017-11-04 – 2017-11-06 (×3): 40 mg via ORAL
  Filled 2017-11-03 (×3): qty 2

## 2017-11-03 MED ORDER — ONDANSETRON HCL 4 MG PO TABS: 4.0000 mg | ORAL_TABLET | Freq: Four times a day (QID) | ORAL | Status: DC | PRN

## 2017-11-03 MED ORDER — ALBUTEROL SULFATE (2.5 MG/3ML) 0.083% IN NEBU: 2.5000 mg | INHALATION_SOLUTION | RESPIRATORY_TRACT | Status: DC | PRN

## 2017-11-03 MED ORDER — ALBUTEROL SULFATE (2.5 MG/3ML) 0.083% IN NEBU
5.0000 mg | INHALATION_SOLUTION | Freq: Once | RESPIRATORY_TRACT | Status: AC
  Administered 2017-11-03: 5 mg via RESPIRATORY_TRACT
  Filled 2017-11-03: qty 6

## 2017-11-03 MED ORDER — TRAMADOL HCL 50 MG PO TABS
50.0000 mg | ORAL_TABLET | Freq: Four times a day (QID) | ORAL | Status: DC | PRN
  Administered 2017-11-03 – 2017-11-06 (×3): 50 mg via ORAL
  Filled 2017-11-03 (×3): qty 1

## 2017-11-03 MED ORDER — MOMETASONE FURO-FORMOTEROL FUM 200-5 MCG/ACT IN AERO
2.0000 | INHALATION_SPRAY | Freq: Two times a day (BID) | RESPIRATORY_TRACT | Status: DC
  Administered 2017-11-04 – 2017-11-06 (×5): 2 via RESPIRATORY_TRACT
  Filled 2017-11-03 (×2): qty 8.8

## 2017-11-03 MED ORDER — SODIUM CHLORIDE 0.9% FLUSH: 3.0000 mL | INTRAVENOUS | Status: DC | PRN

## 2017-11-03 MED ORDER — FUROSEMIDE 10 MG/ML IJ SOLN
40.0000 mg | Freq: Every day | INTRAMUSCULAR | Status: DC
  Administered 2017-11-04: 40 mg via INTRAVENOUS
  Filled 2017-11-03: qty 4

## 2017-11-03 MED ORDER — ALBUTEROL SULFATE HFA 108 (90 BASE) MCG/ACT IN AERS: 2.0000 | INHALATION_SPRAY | Freq: Four times a day (QID) | RESPIRATORY_TRACT | Status: DC | PRN

## 2017-11-03 MED ORDER — ACETAMINOPHEN 325 MG PO TABS: 650.0000 mg | ORAL_TABLET | Freq: Four times a day (QID) | ORAL | Status: DC | PRN

## 2017-11-03 MED ORDER — DOXYCYCLINE HYCLATE 100 MG PO TABS
100.0000 mg | ORAL_TABLET | Freq: Two times a day (BID) | ORAL | Status: DC
  Administered 2017-11-03 – 2017-11-06 (×6): 100 mg via ORAL
  Filled 2017-11-03 (×6): qty 1

## 2017-11-03 MED ORDER — FUROSEMIDE 40 MG PO TABS: 60.0000 mg | ORAL_TABLET | Freq: Two times a day (BID) | ORAL | Status: DC

## 2017-11-03 MED ORDER — LORAZEPAM 2 MG/ML IJ SOLN
1.0000 mg | INTRAMUSCULAR | Status: DC | PRN
  Administered 2017-11-04 – 2017-11-06 (×4): 1 mg via INTRAVENOUS
  Filled 2017-11-03 (×4): qty 1

## 2017-11-03 MED ORDER — NICOTINE 21 MG/24HR TD PT24: 21.0000 mg | MEDICATED_PATCH | Freq: Every day | TRANSDERMAL | Status: DC

## 2017-11-03 MED ORDER — NICOTINE 21 MG/24HR TD PT24
21.0000 mg | MEDICATED_PATCH | Freq: Every day | TRANSDERMAL | Status: DC
  Administered 2017-11-03 – 2017-11-06 (×4): 21 mg via TRANSDERMAL
  Filled 2017-11-03 (×4): qty 1

## 2017-11-03 MED ORDER — ONDANSETRON HCL 4 MG/2ML IJ SOLN: 4.0000 mg | Freq: Four times a day (QID) | INTRAMUSCULAR | Status: DC | PRN

## 2017-11-03 MED ORDER — KETOROLAC TROMETHAMINE 15 MG/ML IJ SOLN
15.0000 mg | Freq: Four times a day (QID) | INTRAMUSCULAR | Status: DC | PRN
Start: 2017-11-03 — End: 2017-11-06
  Administered 2017-11-03 – 2017-11-05 (×4): 15 mg via INTRAVENOUS
  Filled 2017-11-03 (×4): qty 1

## 2017-11-03 MED ORDER — LABETALOL HCL 100 MG PO TABS
100.0000 mg | ORAL_TABLET | Freq: Two times a day (BID) | ORAL | Status: DC
  Filled 2017-11-03 (×2): qty 1

## 2017-11-03 MED ORDER — ADULT MULTIVITAMIN W/MINERALS CH
1.0000 | ORAL_TABLET | Freq: Every day | ORAL | Status: DC
  Administered 2017-11-04 – 2017-11-06 (×3): 1 via ORAL
  Filled 2017-11-03 (×3): qty 1

## 2017-11-03 MED ORDER — SODIUM CHLORIDE 0.9% FLUSH: 3.0000 mL | Freq: Two times a day (BID) | INTRAVENOUS | Status: DC

## 2017-11-03 MED ORDER — ASPIRIN EC 81 MG PO TBEC
81.0000 mg | DELAYED_RELEASE_TABLET | Freq: Every day | ORAL | Status: DC
  Administered 2017-11-04 – 2017-11-06 (×3): 81 mg via ORAL
  Filled 2017-11-03 (×3): qty 1

## 2017-11-03 MED ORDER — LIDO-CAPSAICIN-MEN-METHYL SAL 4-0.0375-5-20 % EX PTCH: 1.0000 | MEDICATED_PATCH | Freq: Two times a day (BID) | CUTANEOUS | Status: DC

## 2017-11-03 NOTE — ED Provider Notes (Addendum)
[facility] EMERGENCY DEPARTMENT Provider Note   CSN: [id number] Arrival date & time: 11/03/17  1243     History   Chief Complaint Chief Complaint  Patient presents with  . Chest Pain  . Shortness of Breath    HPI Brian [name] is a 62 y.o. male.  The history is provided by the patient, the EMS personnel and medical records. No language interpreter was used.  Shortness of Breath  This is a recurrent problem. The average episode lasts 1 day. The problem occurs continuously.The current episode started yesterday. The problem has been gradually worsening. Associated symptoms include cough, wheezing, chest pain and leg swelling. Pertinent negatives include no fever, no headaches, no rhinorrhea, no neck pain, no sputum production, no vomiting, no abdominal pain and no leg pain. It is unknown what precipitated the problem. He has tried nothing for the symptoms. The treatment provided no relief. Associated medical issues include COPD and heart failure.    Past Medical History:  Diagnosis Date  . Alcohol abuse    Heavy alcohol abuse and homelessness  . Arthritis   . CHF (congestive heart failure) (HCC)   . Cocaine abuse (HCC)    And crack   . COPD (chronic obstructive pulmonary disease) (HCC)   . Heart failure   . Homelessness   . Suicide attempt (HCC)   . Tobacco abuse     Patient Active Problem List   Diagnosis Date Noted  . Acute respiratory failure with hypoxia and hypercapnia (HCC) 10/26/2017  . Tobacco dependence 09/27/2017  . Chronic systolic CHF (congestive heart failure) (HCC) 09/25/2017  . COPD with acute exacerbation (HCC) 09/30/2015  . Substance induced mood disorder (HCC) 02/04/2013  . Polysubstance (excluding opioids) dependence, daily use (HCC) 02/03/2013  . Alcohol dependence (HCC) 02/03/2013  . Gout attack 11/12/2012  . Closed fracture of 5th metacarpal 11/12/2012  . Chest pain 08/27/2012  . COPD (chronic obstructive pulmonary disease)  (HCC)   . Homelessness   . Cocaine abuse [facility])     Past Surgical History:  Procedure Laterality Date  . INNER EAR SURGERY          Home Medications    Prior to Admission medications   Medication Sig Start Date End Date Taking? Authorizing Provider  albuterol (PROVENTIL HFA;VENTOLIN HFA) 108 (90 Base) MCG/ACT inhaler Inhale 2 puffs into the lungs every 6 (six) hours as needed for wheezing or shortness of breath. Patient not taking: Reported on 10/14/2017 10/01/17   Lonia Blood, MD  aspirin EC 81 MG EC tablet Take 1 tablet (81 mg total) by mouth daily. Patient not taking: Reported on 10/14/2017 10/02/17   Lonia Blood, MD  furosemide (LASIX) 20 MG tablet Take 3 tablets (60 mg total) by mouth 2 (two) times daily. Patient not taking: Reported on 10/14/2017 10/01/17   Lonia Blood, MD  labetalol (NORMODYNE) 100 MG tablet Take 1 tablet (100 mg total) by mouth 2 (two) times daily. Patient not taking: Reported on 10/14/2017 10/01/17   Lonia Blood, MD  Lido-Capsaicin-Men-Methyl Sal (1ST MEDX-PATCH/ LIDOCAINE) 4-0.373-08-27 % PTCH Apply 1 patch topically 2 (two) times daily. Apply to the area left of your neck, on the top of your shoulder Patient not taking: Reported on 10/14/2017 10/09/17   Gerhard Munch, MD  lisinopril (PRINIVIL,ZESTRIL) 5 MG tablet Take 1 tablet (5 mg total) by mouth daily. Patient not taking: Reported on 10/14/2017 10/01/17   Lonia Blood, MD  thiamine 100 MG tablet Take 1  tablet (100 mg total) by mouth daily. 10/17/17   Richarda Overlie, MD    Family History Family History  Problem Relation Age of Onset  . Coronary artery disease Unknown   . Diabetes Unknown   . Cancer Sister     Social History Social History   Tobacco Use  . Smoking status: Current Every Day Smoker    Packs/day: 2.00    Types: Cigarettes  . Smokeless tobacco: Never Used  Substance Use Topics  . Alcohol use: Yes    Comment: Heavy. 5-10 40oz daily  . Drug use: No    Types:  Marijuana, Cocaine    Comment: Marijuana and cocaine, crack     Allergies   Patient has no known allergies.   Review of Systems Review of Systems  Constitutional: Positive for diaphoresis and fatigue. Negative for chills and fever.  HENT: Negative for rhinorrhea.   Respiratory: Positive for cough, chest tightness, shortness of breath and wheezing. Negative for sputum production and stridor.   Cardiovascular: Positive for chest pain and leg swelling. Negative for palpitations.  Gastrointestinal: Negative for abdominal pain, constipation, diarrhea, nausea and vomiting.  Genitourinary: Negative for dysuria.  Musculoskeletal: Negative for back pain, neck pain and neck stiffness.  Neurological: Negative for dizziness, light-headedness and headaches.  Psychiatric/Behavioral: Negative for agitation.  All other systems reviewed and are negative.    Physical Exam Updated Vital Signs BP 100/63 (BP Location: Right Arm)   Pulse 98   Temp 98.5 F (36.9 C) (Oral)   Resp (!) 31   Ht 5\' 7"  (1.702 m)   Wt 83.5 kg (184 lb)   SpO2 100%   BMI 28.82 kg/m   Physical Exam  Constitutional: He is oriented to person, place, and time. He appears well-developed and well-nourished.  Non-toxic appearance. He appears ill. No distress.  HENT:  Head: Normocephalic and atraumatic.  Mouth/Throat: Oropharynx is clear and moist.  Eyes: Pupils are equal, round, and reactive to light. Conjunctivae and EOM are normal.  Neck: Neck supple.  Cardiovascular: Normal rate, regular rhythm and intact distal pulses.  No murmur heard. Pulmonary/Chest: Effort normal. No respiratory distress. He has wheezes. He has rales.  Abdominal: Soft. There is no tenderness. There is no guarding.  Musculoskeletal: He exhibits edema (mild). He exhibits no tenderness.  Neurological: He is alert and oriented to person, place, and time. No sensory deficit. He exhibits normal muscle tone.  Skin: Skin is warm. He is diaphoretic. No  erythema. No pallor.  Psychiatric: He has a normal mood and affect.  Nursing note and vitals reviewed.    ED Treatments / Results  Labs (all labs ordered are listed, but only abnormal results are displayed) Labs Reviewed  BASIC METABOLIC PANEL - Abnormal; Notable for the following components:      Result Value   Glucose, Bld 197 (*)    Calcium 8.3 (*)    All other components within normal limits  CBC - Abnormal; Notable for the following components:   WBC 12.0 (*)    MCV 100.4 (*)    All other components within normal limits  HEPATIC FUNCTION PANEL - Abnormal; Notable for the following components:   Total Protein 5.9 (*)    Albumin 3.2 (*)    AST 42 (*)    ALT 69 (*)    All other components within normal limits  BRAIN NATRIURETIC PEPTIDE - Abnormal; Notable for the following components:   B Natriuretic Peptide 761.8 (*)    All other components within  normal limits  URINALYSIS, ROUTINE W REFLEX MICROSCOPIC - Abnormal; Notable for the following components:   Color, Urine STRAW (*)    Specific Gravity, Urine 1.003 (*)    All other components within normal limits  RAPID URINE DRUG SCREEN, HOSP PERFORMED - Abnormal; Notable for the following components:   Cocaine POSITIVE (*)    Benzodiazepines POSITIVE (*)    Tetrahydrocannabinol POSITIVE (*)    All other components within normal limits  I-STAT VENOUS BLOOD GAS, ED - Abnormal; Notable for the following components:   pO2, Ven 56.0 (*)    Bicarbonate 29.5 (*)    All other components within normal limits  URINE CULTURE  D-DIMER, QUANTITATIVE (NOT AT [facility])  BLOOD GAS, VENOUS  I-STAT TROPONIN, ED    EKG EKG Interpretation  Date/Time:  Saturday November 03 2017 12:53:42 EDT Ventricular Rate:  105 PR Interval:    QRS Duration: 83 QT Interval:  331 QTC Calculation: 438 R Axis:   76 Text Interpretation:  Sinus tachycardia Probable left atrial enlargement Borderline repolarization abnormality When compared to prior, no  significnat changes seen.  No STEMI Confirmed by Theda Belfast ([id number]) on 11/03/2017 12:59:51 PM   Radiology Dg Chest 2 View  Result Date: 11/03/2017 CLINICAL DATA:  Chest pain and shortness of breath for several hours EXAM: CHEST - 2 VIEW COMPARISON:  10/26/2017 FINDINGS: The heart size and mediastinal contours are within normal limits. Both lungs are clear. The visualized skeletal structures are unremarkable. IMPRESSION: No active cardiopulmonary disease. Electronically Signed   By: Alcide Clever M.D.   On: 11/03/2017 13:53    Procedures Procedures (including critical care time)  CRITICAL CARE Performed by: Canary Brim Tammy Ericsson Total critical care time: 35 minutes Critical care time was exclusive of separately billable procedures and treating other patients. Critical care was necessary to treat or prevent imminent or life-threatening deterioration. Critical care was time spent personally by me on the following activities: development of treatment plan with patient and/or surrogate as well as nursing, discussions with consultants, evaluation of patient's response to treatment, examination of patient, obtaining history from patient or surrogate, ordering and performing treatments and interventions, ordering and review of laboratory studies, ordering and review of radiographic studies, pulse oximetry and re-evaluation of patient's condition.   Medications Ordered in ED Medications  albuterol (PROVENTIL) (2.5 MG/3ML) 0.083% nebulizer solution 5 mg (5 mg Nebulization Given 11/03/17 1440)  furosemide (LASIX) injection 40 mg (40 mg Intravenous Given 11/03/17 1424)     Initial Impression / Assessment and Plan / ED Course  I have reviewed the triage vital signs and the nursing notes.  Pertinent labs & imaging results that were available during my care of the patient were reviewed by me and considered in my medical decision making (see chart for details).     Brian [name] is a 62 y.o.  male with a past medical history significant for COPD, polysubstance abuse, CHF, and recurrent hypoxic respiratory failure episodes who presents with chest pain, shortness of breath, lightheadedness, chills, productive cough, and fatigue.  Patient reports that he is "willing to stay this time".  He says that over the last several days he has had continued worsening cough with green sputum and shortness of breath.  He reports that he woke up with chest pain around 2:30 AM this morning and had worsened shortness of breath.  He reports the chest pain is a stabbing pain going into his left shoulder and down his left arm with numbness.  He reports  that this is worse than pain he has had in the past.  He denies any nausea or vomiting but reports feeling lightheaded.  He reports he has had some chills but no documented fevers at home.  He says that he is gasping and cannot get a short of breath.  He denies any history of DVT or PE.  He thinks he is somewhat more generally edematous than baseline.  He denies recent trauma or other complaints.  On exam, chest is tender to palpation.  Patient has a reported sensation decreased in the left arm compared to the right.  Abdomen is mildly tender diffusely.  Patient had no CVA tenderness.  Lungs had faint wheezing in all lung fields and some crackles in the bases.  Legs had mild edema in both legs.  Patient has symmetric pulses in upper extremities.  The chart review shows that patient has needed BiPAP several times in the past and was recently admitted for similar symptoms.  With his reported history, patient will have x-ray to look for pneumonia with his productive cough, chills, and shortness of breath.  He also had laboratory testing to further evaluate.  Patient's EKG appeared similar to prior with similar T wave abnormalities.  No STEMI seen.  Patient is on 6 L nasal cannula after de-escalation from nonrebreather with EMS.  At this time, do not feel patient needs BiPAP  as he is maintaining his oxygen saturations however if CO2 is worsen, may consider BiPAP.  Patient will have laboratory testing and imaging to further evaluate his symptoms.  D-dimer will be included given the tachycardia, hypoxia, and the pleuritic pain.  Anticipate admission.  2:18 PM Diagnostic testing revealed a  CO2 of 56 on blood gas.  Urinalysis did not show infection however the UDS showed cocaine.  Suspect this is not helping his respiratory status and chest pain.  His kidney function was not elevated and he had a mild leukocytosis.  Normal hemoglobin.  Hepatic function slightly elevated.  Chest x-ray shows no acute cardia pulmonary disease.  D-dimer was negative, doubt PE.    Clinically I suspect a combination of CHF exacerbation with his BNP more elevated than prior as well as  COPD exacerbation with his wheezing.  Given his persistent hypoxia on room air, patient will be admitted for further management.  Patient was given Lasix for the fluid overload component.  Admitting team will be called.  Final Clinical Impressions(s) / ED Diagnoses   Final diagnoses:  Shortness of breath  Precordial pain  COPD exacerbation (HCC)  Acute on chronic congestive heart failure, unspecified heart failure type [facility])    ED Discharge Orders    None     Clinical Impression: 1. Shortness of breath   2. Precordial pain   3. COPD exacerbation (HCC)   4. Acute on chronic congestive heart failure, unspecified heart failure type (HCC)     Disposition: Admit  This note was prepared with assistance of Dragon voice recognition software. Occasional wrong-word or sound-a-like substitutions may have occurred due to the inherent limitations of voice recognition software.     Zitlaly Malson, Canary Brim, MD 11/03/17 1550    Rodrigues Urbanek, Canary Brim, MD 11/13/17 2002

## 2017-11-03 NOTE — ED Notes (Signed)
MD at bedside. 

## 2017-11-03 NOTE — ED Notes (Signed)
Pt notably diaphoretic w/ increased lethargy after returning from x-ray; MD notified; will continue to monitor

## 2017-11-03 NOTE — ED Notes (Signed)
Patient transported to X-ray 

## 2017-11-03 NOTE — H&P (Signed)
History and Physical    Brian [name] ZOX:[id number] DOB: [birth year] DOA: 11/03/2017  PCP: Patient, [name]  Patient [place] via [place]  I have personally briefly reviewed patient's old medical records in [facility]  Chief Complaint:   HPI: Brian [name] is a 62 y.o. male with medical history significant of COPD, polysubstance use disorder, congestive heart failure, recurrent hypoxic respiratory failure episodes who presents emergency department with chest pain, shortness of breath, lightheadedness, chills, productive cough, and fatigue.  Over the last several days he has had continued worsening of cough with green sputum and shortness of breath.  He woke up with chest pain around 230 this morning and has worsened with increases in his baseline shortness of breath.  He denies any nausea or vomiting but reports feeling lightheaded.  He had some chills but no documented fevers at home chest pain is stabbing it goes to his left shoulder.  He says that he gasps and cannot enough air.  Edematous than at baseline.  He has had no recent trauma or other complaints.  The patient has had several episodes of admissions in the past where he has required BiPAP and was admitted for similar symptoms.  [place] the woods and I do not think he has any electricity or running water.  Initially the patient was on percent nonrebreather but was able to be de-escalated down to 6 L nasal cannula.  Urine drug screen revealed cocaine use which may explain his chest pain.  Have an elevated troponin.  Chest x-ray shows no acute cardiopulmonary pulmonary abnormality.  D-dimer was negative this appears to be a combination of congestive heart failure and COPD as his BNP is elevated and he was wheezing earlier.  Lasix has been ordered for fluid overload.  Please note that it is been very very hot outside we are having a heat wave and the patient lives in a tent I suspect  that the weather has not been helping his breathing or his congestive heart failure at all.  Patient will be admitted into the hospital for further evaluation and management.   Review of Systems: As per HPI otherwise all other systems reviewed and  negative.    Past Medical History:  Diagnosis Date  . Alcohol abuse    Heavy alcohol abuse and homelessness  . Arthritis   . CHF (congestive heart failure) (HCC)   . Cocaine abuse (HCC)    And crack   . COPD (chronic obstructive pulmonary disease) (HCC)   . Heart failure   . Homelessness   . Suicide attempt (HCC)   . Tobacco abuse     Past Surgical History:  Procedure Laterality Date  . INNER EAR SURGERY      Social History   Social History Narrative   Currently homeless. Has poor social support, no family, just friends and police who check on him. Heavy alcohol noted in the past, currently states he drinks "1-2 beers a day" and last drink was earlier today. States he may get minor shakes and anxiety but denies frank EtOH withdrawal, seizures, DT's. States last cocaine and MJ abuse was last week. Denies IVDU      reports that he has been smoking cigarettes.  He has been smoking about 2.00 packs per day. He has never used smokeless tobacco. He reports that he drinks alcohol. He reports that he does not use drugs.  No Known Allergies  Family History  Problem Relation Age of Onset  . Coronary artery disease Unknown   . Diabetes Unknown   . Cancer Sister     Prior to Admission medications   Medication Sig Start Date End Date Taking? Authorizing Provider  albuterol (PROVENTIL HFA;VENTOLIN HFA) 108 (90 Base) MCG/ACT inhaler Inhale 2 puffs into the lungs every 6 (six) hours as needed for wheezing or shortness of breath. Patient not taking: Reported on 10/14/2017 10/01/17   Lonia Blood, MD  aspirin EC 81 MG EC tablet Take 1 tablet (81 mg total) by mouth daily. Patient not taking: Reported on 10/14/2017 10/02/17   Lonia Blood, MD  furosemide (LASIX) 20 MG tablet Take 3 tablets (60 mg total) by mouth 2 (two) times daily. Patient not taking: Reported on 10/14/2017 10/01/17   Lonia Blood, MD  labetalol (NORMODYNE) 100 MG tablet Take 1 tablet (100 mg total) by mouth 2 (two) times daily. Patient not taking: Reported on 10/14/2017 10/01/17   Lonia Blood, MD  Lido-Capsaicin-Men-Methyl Sal (1ST MEDX-PATCH/ LIDOCAINE) 4-0.373-08-27 % PTCH Apply 1 patch topically 2 (two) times daily. Apply to the area left of your neck, on the top of your shoulder Patient not taking: Reported on 10/14/2017 10/09/17   Gerhard Munch, MD  lisinopril (PRINIVIL,ZESTRIL) 5 MG tablet Take 1 tablet (5 mg total) by mouth daily. Patient not taking: Reported on 10/14/2017 10/01/17   Lonia Blood, MD  thiamine 100 MG tablet Take 1 tablet (100 mg total) by mouth daily. Patient not taking: Reported on 11/03/2017 10/17/17   Richarda Overlie, MD    Physical Exam:  Constitutional: Acutely and chronically ill-appearing white male in moderate respiratory discomfort with increased accessory muscle use and increased respiratory rate. Vitals:   11/03/17 1430 11/03/17 1445 11/03/17 1500 11/03/17 1515  BP: 111/62 (!) 96/56 127/89 121/60  Pulse: 75 72 (!) 107 84  Resp: (!) 25 (!) 21 (!) 28 (!) 30  Temp:      TempSrc:      SpO2: 96% 94% 95% 94%  Weight:      Height:       Eyes: PERRL, lids and conjunctivae normal ENMT: Mucous membranes are moist. Posterior pharynx clear of any exudate or lesions.Normal dentition.  Neck: normal, supple, no masses, no thyromegaly Respiratory: Very difficult to auscultate breath sounds very distance with coarse breath sounds bilaterally, mild diffuse wheezing, no crackles.  Markedly increased respiratory effort.  Some accessory muscle use.  Cardiovascular: Regular rate and rhythm, no murmurs / rubs / gallops. No extremity edema. 2+ pedal pulses. No carotid bruits.  Abdomen: no tenderness, no masses palpated. No  hepatosplenomegaly. Bowel sounds positive.  Musculoskeletal: no clubbing / cyanosis. No joint deformity upper and lower extremities. Good ROM, no contractures. Normal muscle tone.  Skin: no rashes, lesions, ulcers. No induration Neurologic: CN 2-12 grossly intact. Sensation intact, DTR normal. Strength 5/5 in all 4.  Psychiatric: Normal judgment and insight. Alert and oriented x 3.  Pressed mood  Labs on Admission: I have personally reviewed following labs and imaging studies  CBC: Recent Labs  Lab 11/03/17 1302  WBC 12.0*  HGB 15.0  HCT 48.6  MCV 100.4*  PLT 178   Basic Metabolic Panel: Recent Labs  Lab 10/28/17 0206 11/03/17 1302  NA 140 143  K 4.4 3.7  CL 98 107  CO2 28 27  GLUCOSE 312* 197*  BUN 31* 13  CREATININE 1.07 0.82  CALCIUM 9.2 8.3*  MG 1.9  --    GFR:  Estimated Creatinine Clearance: 100.3 mL/min (by C-G formula based on SCr of 0.82 mg/dL). Liver Function Tests: Recent Labs  Lab 11/03/17 1302  AST 42*  ALT 69*  ALKPHOS 40  BILITOT 0.7  PROT 5.9*  ALBUMIN 3.2*   CBG: Recent Labs  Lab 10/28/17 1636  GLUCAP 133*   Urine analysis:    Component Value Date/Time   COLORURINE STRAW (A) 11/03/2017 1324   APPEARANCEUR CLEAR 11/03/2017 1324   APPEARANCEUR Clear 10/10/2012 0320   LABSPEC 1.003 (L) 11/03/2017 1324   LABSPEC 1.009 10/10/2012 0320   PHURINE 5.0 11/03/2017 1324   GLUCOSEU NEGATIVE 11/03/2017 1324   GLUCOSEU Negative 10/10/2012 0320   HGBUR NEGATIVE 11/03/2017 1324   BILIRUBINUR NEGATIVE 11/03/2017 1324   BILIRUBINUR Negative 10/10/2012 0320   KETONESUR NEGATIVE 11/03/2017 1324   PROTEINUR NEGATIVE 11/03/2017 1324   UROBILINOGEN 0.2 08/27/2012 0659   NITRITE NEGATIVE 11/03/2017 1324   LEUKOCYTESUR NEGATIVE 11/03/2017 1324   LEUKOCYTESUR Negative 10/10/2012 0320    Radiological Exams on Admission: Dg Chest 2 View  Result Date: 11/03/2017 CLINICAL DATA:  Chest pain and shortness of breath for several hours EXAM: CHEST - 2 VIEW  COMPARISON:  10/26/2017 FINDINGS: The heart size and mediastinal contours are within normal limits. Both lungs are clear. The visualized skeletal structures are unremarkable. IMPRESSION: No active cardiopulmonary disease. Electronically Signed   By: Alcide Clever M.D.   On: 11/03/2017 13:53    EKG: Independently reviewed.  Sinus tachycardia at 105 bpm with left atrial enlargement and borderline repolarization abnormality  Assessment/Plan Principal Problem:   Acute on chronic respiratory failure with hypoxia (HCC) Active Problems:   Acute exacerbation of chronic obstructive pulmonary disease (COPD) (HCC)   Acute on chronic systolic CHF (congestive heart failure), NYHA class 4 (HCC)   Hyperglycemia   Homelessness   Cocaine abuse (HCC)   Polysubstance (excluding opioids) dependence, daily use (HCC)   Alcohol dependence (HCC)   Substance induced mood disorder (HCC)   Tobacco dependence   1.  Acute on chronic respiratory failure with hypoxemia: Patient likely exacerbation of COPD worsened with some mild CHF.  He has a known ejection fraction of 30 to 35% with diffuse hypokinesis noted on an echocardiogram of June 2019.  He also has known COPD.  He is markedly hypoxemic.  We will provide oxygen supplementation and treat underlying cause.  2.  Acute exacerbation of chronic obstructive pulmonary disease: Patient with long-standing history of COPD.  He does not have access to a nebulizer at home because he has no electricity.  He is also has some wheezing and he is a chronic smoker.  We will treat his COPD exacerbation with prednisone 40 mg p.o. daily for 5 days, I will check a respiratory viral panel.  Start doxycycline 100 mg p.o. every 12 hours.  Scheduled nebulizers with as needed albuterol.  Continue O2 supplementation.  3.  Acute on chronic systolic congestive heart failure New York Heart Association class IV: Patient with an ejection fraction of 30 to 35% with diffuse hypokinesis and mild  pulmonary hypertension.  We will add gentle diuresis to his medication regimen.  Will monitor closely he will be on a 1500 mL fluid restriction.  4.  Hyperglycemia: We will check fingerstick blood glucoses as needed start sliding scale insulin if remains elevated will check hemoglobin A1c.  5.  Homelessness: Social work has been consulted hopefully we can obtain some sort of temporary housing for the patient.  6.  Polysubstance abuse excluding opioids with daily  use, patient an avid alcohol drinker, he uses cocaine as well.  He does not show any signs of withdrawal at present will monitor closely and have a low threshold to start treatment for alcohol withdrawal.  Ativan 1 mg IV every 4 hours as needed is ordered.  7.  Tobacco dependence: Nicotine patch is ordered.  8.  Substance-induced mood disorder: Patient might benefit from outpatient substance use program.   DVT prophylaxis: Subcu heparin Code Status: Full CODE STATUS Family Communication: No family present patient retains capacity Disposition Plan: Hopefully it will place with a roof over his head likely in 3 or 4 days Consults called: Palliative care, social work, Admission status: Inpatient   Lahoma Crocker MD FACP [facility] Pager [phone]  If 7PM-7AM, please contact night-coverage [email or web address] Password [facility]  11/03/2017, 3:57 PM

## 2017-11-03 NOTE — Progress Notes (Signed)
Telemetry called, patient had 4 beats of NSVT.

## 2017-11-03 NOTE — ED Triage Notes (Addendum)
Pt from home via ems; pt c/o cp and sob since 0230 this am; hx copd; lung sounds diminished with ems; cp L sided w/ L arm radiation; no n,v  PTA: 1 duoneb 125 solumed  324 asa 1 nitro w/ no improvement  100/60 -250 NS HR 109 100% O2 (8L, neb tx); 88% RA cbg 204 RR 22-24

## 2017-11-03 NOTE — Progress Notes (Signed)
   11/03/17 1900  Clinical Encounter Type  Visited With Patient  Visit Type Initial  Referral From Nurse  Consult/Referral To Chaplain  Spiritual Encounters  Spiritual Needs Emotional  Stress Factors  Patient Stress Factors Exhausted  Family Stress Factors None identified    Pt was laying in bed with oxygen tube in place and seemingly in lots of pain. Pt kept asking for pain medication and air conditioning to be changed. Chaplain introduced the topic of advance directives and pt suggested for it to be done on Monday. Chaplain will follow up on Monday.   Norman Bier a Water quality scientist, [employer]

## 2017-11-04 ENCOUNTER — Encounter (HOSPITAL_COMMUNITY): Payer: Self-pay | Admitting: Physician Assistant

## 2017-11-04 DIAGNOSIS — J9621 Acute and chronic respiratory failure with hypoxia: Secondary | ICD-10-CM

## 2017-11-04 DIAGNOSIS — Z7189 Other specified counseling: Secondary | ICD-10-CM

## 2017-11-04 DIAGNOSIS — Z515 Encounter for palliative care: Secondary | ICD-10-CM

## 2017-11-04 LAB — RESPIRATORY PANEL BY PCR
ADENOVIRUS-RVPPCR: NOT DETECTED
Bordetella pertussis: NOT DETECTED
CHLAMYDOPHILA PNEUMONIAE-RVPPCR: NOT DETECTED
Coronavirus 229E: NOT DETECTED
Coronavirus HKU1: NOT DETECTED
Coronavirus NL63: NOT DETECTED
Coronavirus OC43: NOT DETECTED
Influenza A: NOT DETECTED
Influenza B: NOT DETECTED
Metapneumovirus: NOT DETECTED
Mycoplasma pneumoniae: NOT DETECTED
PARAINFLUENZA VIRUS 4-RVPPCR: NOT DETECTED
Parainfluenza Virus 1: NOT DETECTED
Parainfluenza Virus 2: NOT DETECTED
Parainfluenza Virus 3: NOT DETECTED
Respiratory Syncytial Virus: NOT DETECTED
Rhinovirus / Enterovirus: NOT DETECTED

## 2017-11-04 LAB — URINE CULTURE

## 2017-11-04 MED ORDER — CARVEDILOL 3.125 MG PO TABS
3.1250 mg | ORAL_TABLET | Freq: Two times a day (BID) | ORAL | Status: DC
  Administered 2017-11-04 (×2): 3.125 mg via ORAL
  Filled 2017-11-04 (×2): qty 1

## 2017-11-04 NOTE — Progress Notes (Signed)
Initial Nutrition Assessment  DOCUMENTATION CODES:   Obesity unspecified  INTERVENTION:   Continue Ensure Enlive po BID, each supplement provides 350 kcal and 20 grams of protein  Continue Thiamine, MVI, Folic Acid   NUTRITION DIAGNOSIS:   Inadequate oral intake related to (homelessness) as evidenced by per patient/family report.  GOAL:   Patient will meet greater than or equal to 90% of their needs  MONITOR:   Supplement acceptance  REASON FOR ASSESSMENT:   Malnutrition Screening Tool, Consult Assessment of nutrition requirement/status  ASSESSMENT:   Brian [name] has a PMH of COPD, polysubstance use, CHF, recurrent hypoxic respiratory failure episodes,. He is currently homeless and lives in a tent. Presents with AECOPD, and mild CHF exacerbation.  Spoke with patient at bedside. He is homeless at baseline, normally eats 2 meals a day, a biscuit for breakfast and a burger for lunch. He is unsure of weight loss. PO was 100% today and consumed both of his ensure, only complaint is pain.  Medications reviewed and include:  Colace, Folic Acid, MVI, Prednisone Labs reviewed    NUTRITION - FOCUSED PHYSICAL EXAM:    Most Recent Value  Orbital Region  No depletion  Upper Arm Region  No depletion  Thoracic and Lumbar Region  No depletion  Buccal Region  No depletion  Temple Region  No depletion  Clavicle Bone Region  No depletion  Clavicle and Acromion Bone Region  No depletion  Scapular Bone Region  No depletion  Dorsal Hand  No depletion  Patellar Region  No depletion  Anterior Thigh Region  No depletion  Posterior Calf Region  No depletion  Edema (RD Assessment)  None  Hair  Reviewed  Eyes  Reviewed  Mouth  Reviewed  Skin  Reviewed  Nails  Reviewed       Diet Order:   Diet Order           Diet Heart Room service appropriate? Yes; Fluid consistency: Thin; Fluid restriction: 1500 mL Fluid  Diet effective now          EDUCATION NEEDS:   No education  needs have been identified at this time  Skin:  Skin Assessment: Skin Integrity Issues: Skin Integrity Issues:: Other (Comment) Other: MASD to BL buttocks  Last BM:  11/03/2017  Height:   Ht Readings from Last 1 Encounters:  11/03/17 5\' 6"  (1.676 m)    Weight:   Wt Readings from Last 1 Encounters:  11/04/17 186 lb 14.4 oz (84.8 kg)    Ideal Body Weight:  64.54 kg  BMI:  Body mass index is 30.17 kg/m.  Estimated Nutritional Needs:   Kcal:  1850-2000 calories  Protein:  102-110 grams  Fluid:  Per MD    Brian Ano. Shaurya Rawdon, MS, RD LDN Inpatient Clinical Dietitian Pager [phone]

## 2017-11-04 NOTE — Evaluation (Signed)
Clinical/Bedside Swallow Evaluation Patient Details  Name: Brian [name] MRN: [id number] Date of Birth: 1956/03/28  Today's Date: 11/04/2017 Time: SLP Start Time (ACUTE ONLY): 1506 SLP Stop Time (ACUTE ONLY): 1518 SLP Time Calculation (min) (ACUTE ONLY): 12 min  Past Medical History:  Past Medical History:  Diagnosis Date  . Alcohol abuse    Heavy alcohol abuse and homelessness  . Arthritis   . Cardiomyopathy (HCC)   . CHF (congestive heart failure) (HCC)   . Cocaine abuse (HCC)    And crack   . COPD (chronic obstructive pulmonary disease) (HCC)   . Heart failure   . Homelessness   . Noncompliance   . Paroxysmal VT (HCC)    a. first seen 2013 in setting of normal LVEF 55-60%, EF now decreased.  . Suicide attempt (HCC)   . Tobacco abuse    Past Surgical History:  Past Surgical History:  Procedure Laterality Date  . INNER EAR SURGERY     HPI:  Brian [name] is a 62 y.o. male with medical history significant of COPD, polysubstance use disorder, congestive heart failure, recurrent hypoxic respiratory failure episodes who presents emergency department with chest pain, shortness of breath, lightheadedness, chills, productive cough, and fatigue. CXR No active cardiopulmonary disease.   Assessment / Plan / Recommendation Clinical Impression  Pt upset when SLP arrived that he is unable to order cheeseburger due to heart healthy status and discussing with RN. Pt left AMA previous admission. He denies dysphagia; endentulous status however masticated solid texture without observable impairments. No s/s aspiration with straw sips thin. Assessment shorter due to pt's current frustration over situation. He denies increased SOB with meals and SLP explained relationship with COPD and dysphagia. Recent CXR's have been clear. CXR 04/2016 showed infiltrate. Recommend pt continue regular texture/thin liquids, rest breaks if needed. No further ST needed.    SLP Visit Diagnosis: Dysphagia,  unspecified (R13.10)    Aspiration Risk  Mild aspiration risk    Diet Recommendation Regular;Thin liquid   Liquid Administration via: Cup;Straw Medication Administration: Whole meds with liquid Supervision: Patient able to self feed Postural Changes: Seated upright at 90 degrees    Other  Recommendations Oral Care Recommendations: Oral care BID   Follow up Recommendations None      Frequency and Duration            Prognosis        Swallow Study   General HPI: Brian [name] is a 61 y.o. male with medical history significant of COPD, polysubstance use disorder, congestive heart failure, recurrent hypoxic respiratory failure episodes who presents emergency department with chest pain, shortness of breath, lightheadedness, chills, productive cough, and fatigue. CXR No active cardiopulmonary disease. Type of Study: Bedside Swallow Evaluation Previous Swallow Assessment: (none) Diet Prior to this Study: Regular;Thin liquids Temperature Spikes Noted: No Respiratory Status: Room air History of Recent Intubation: No Behavior/Cognition: Alert;Cooperative Oral Cavity Assessment: Within Functional Limits Oral Care Completed by SLP: No Oral Cavity - Dentition: Edentulous Vision: Functional for self-feeding Self-Feeding Abilities: Able to feed self Patient Positioning: Upright in bed Baseline Vocal Quality: Normal Volitional Cough: Strong Volitional Swallow: Able to elicit    Oral/Motor/Sensory Function Overall Oral Motor/Sensory Function: Within functional limits   Ice Chips Ice chips: Not tested   Thin Liquid Thin Liquid: Within functional limits Presentation: Straw    Nectar Thick Nectar Thick Liquid: Not tested   Honey Thick Honey Thick Liquid: Not tested   Puree Puree: Not tested   Solid  Solid: Within functional limits      Royce Macadamia 11/04/2017,3:27 PM   Breck Coons Lonell Face.Ed [employer] [phone]

## 2017-11-04 NOTE — Evaluation (Addendum)
Physical Therapy Evaluation Patient Details Name: Brian [name] MRN: [id number] DOB: November 07, 1955 Today's Date: 11/04/2017   History of Present Illness  Brian [name] is a 62 y.o. male with medical history significant of COPD, polysubstance use disorder, congestive heart failure, recurrent hypoxic respiratory failure episodes who presents emergency department with chest pain, shortness of breath, lightheadedness, chills, productive cough, and fatigue    Clinical Impression  Pt admitted with above diagnosis. Pt currently with functional limitations due to the deficits listed below (see PT Problem List). PTA pt homeless independent with mobility, denies falls. Today pt presents with high resting HR, anxious demeanor, mild tremor with poor coordination and fine motor, limited activity tolerance, and weakness. Pt ambulating with unsteady gait in hallway short distances, safer with RW. HRmax 120, short run of unstained VT, RN notified. Discussed energy conservation, reinforced importance of lifestyle changes. Pt admits he knows he needs to stop but does not appear to have the resources to do so, agree with social work consult to address. Given pts health and mobility do not feel safe to return to community unsupervised.   Pt will benefit from skilled PT to increase their independence and safety with mobility to allow discharge to the venue listed below.       Follow Up Recommendations SNF     Equipment Recommendations  Rollator    Recommendations for Other Services       Precautions / Restrictions Precautions Precautions: Fall Precaution Comments: watch tele, VT Restrictions Weight Bearing Restrictions: No      Mobility  Bed Mobility Overal bed mobility: Independent                Transfers Overall transfer level: Needs assistance   Transfers: Sit to/from Stand Sit to Stand: Supervision         General transfer comment: supervision for safe transfer    Ambulation/Gait Ambulation/Gait assistance: Min guard Gait Distance (Feet): 50 Feet Assistive device: None Gait Pattern/deviations: Step-to pattern Gait velocity: decreased   General Gait Details: pt with unsteady gait, no overt LOB but unsafe without guarding or UE support.   Stairs            Wheelchair Mobility    Modified Rankin (Stroke Patients Only)       Balance Overall balance assessment: Needs assistance Sitting-balance support: Bilateral upper extremity supported;Feet supported Sitting balance-Leahy Scale: Good       Standing balance-Leahy Scale: Fair Standing balance comment: Able to statically stand with supervision without support, however, with dynamic balance tasks including donning pants, patient requiring up to moderate assist due to loss of balance forward and laterally                             Pertinent Vitals/Pain Pain Assessment: No/denies pain    Home Living Family/patient expects to be discharged to:: Shelter/Homeless                      Prior Function Level of Independence: Independent               Hand Dominance        Extremity/Trunk Assessment   Upper Extremity Assessment Upper Extremity Assessment: RUE deficits/detail;LUE deficits/detail RUE Coordination: decreased fine motor LUE Coordination: decreased fine motor    Lower Extremity Assessment Lower Extremity Assessment: Overall WFL for tasks assessed    Cervical / Trunk Assessment Cervical / Trunk Assessment: Normal  Communication  Communication: No difficulties  Cognition Arousal/Alertness: Awake/alert Behavior During Therapy: Anxious Overall Cognitive Status: History of cognitive impairments - at baseline                                 General Comments: pt has poor safety awareness and decreased ability to make good choices given his medical condition       General Comments      Exercises     Assessment/Plan     PT Assessment Patient needs continued PT services  PT Problem List Decreased safety awareness;Cardiopulmonary status limiting activity;Pain       PT Treatment Interventions Gait training;Stair training;Functional mobility training;Therapeutic activities;Therapeutic exercise;Balance training;Patient/family education    PT Goals (Current goals can be found in the Care Plan section)  Acute Rehab PT Goals Patient Stated Goal: get clean, maybe stay with his niece PT Goal Formulation: With patient Time For Goal Achievement: 11/11/17 Potential to Achieve Goals: Fair    Frequency Min 3X/week   Barriers to discharge   homeless    Co-evaluation               AM-PAC PT "6 Clicks" Daily Activity  Outcome Measure Difficulty turning over in bed (including adjusting bedclothes, sheets and blankets)?: None Difficulty moving from lying on back to sitting on the side of the bed? : None Difficulty sitting down on and standing up from a chair with arms (e.g., wheelchair, bedside commode, etc,.)?: A Little Help needed moving to and from a bed to chair (including a wheelchair)?: A Little Help needed walking in hospital room?: A Lot Help needed climbing 3-5 steps with a railing? : A Lot 6 Click Score: 18    End of Session Equipment Utilized During Treatment: Gait belt Activity Tolerance: Patient tolerated treatment well Patient left: in bed;with bed alarm set;with call bell/phone within reach Nurse Communication: Mobility status PT Visit Diagnosis: Unsteadiness on feet (R26.81);Other abnormalities of gait and mobility (R26.89);Apraxia (R48.2)    Time: 1250-1319 PT Time Calculation (min) (ACUTE ONLY): 29 min   Charges:   PT Evaluation $PT Eval Moderate Complexity: 1 Mod PT Treatments $Gait Training: 8-22 mins       Etta Grandchild, PT, DPT Acute Rehab Services Pager: [phone]    Etta Grandchild 11/04/2017, 1:20 PM

## 2017-11-04 NOTE — Consult Note (Addendum)
Cardiology Consultation:   Patient ID: Brian [name]; [id number]; [birth year]   Admit date: 11/03/2017 Date of Consult: 11/04/2017  Primary Care Provider: Patient, [name] Primary Cardiologist: Brian Batty, MD  Chief Complaint: chest pain  Patient Profile:   Brian [name] is a 62 y.o. male with a hx of Polysubstance abuse (alcohol, cocaine, THC), hoomelessness, paroxysmal VT (first identified 2013), cardiomyopathy (last EF 30-35%), tobacco abuse, arthritis, COPD, prior suicide attempt who is being seen today for the evaluation of chest pain, identified as chest pain unit consult by nursing protocol.  History of Present Illness:   Dr. Allyson [name] and Brian [name] consulted on the patient in 2013 for chest pain and runs of wide complex tachycardia/NSVT. Initially cardiac cath was planned but patient declined to proceed. Symptoms were felt due to cocaine-induced vasospasm. EF was normal by echo at that time. He has had several admissions for various medical issues since that time with pneumonia, acute respiratory failure with hypoxia and hypercarbia, COPD exacerbations and alcohol withdrawal. In 2017 during admission for HCAP, 2D echo showed EF 25-30%. OP f/u recommended but is homeless with little resources so has not been in for any outpatient care. In 09/2017 he was admitted with COPD exacerbation. Once again, was found to have runs of VT on telemetry. Cardiology was consulted but patient left AMA prior to the evaluation. He was readmitted the following day 6/20 through 10/01/17 for continued symptoms. Repeat echo 09/27/17 showed mildly dilated LV, EF 30-35%, diffuse HK, mildly dilated RV with normal RV function, mild pulm HTN. He was treated for acute exacerbation of COPD and acute HF exacerbation with -9.4L UOP, dc wt 178lb. He was readmitted 7/7-7/9 with chest pain in setting of cocaine use. D-dimer elevated but patient declined CT scan. He left AMA once more.  Over the last several days he  presented back with continued worsening of cough with green sputum and shortness of breath. He woke up with chest pain around 230 the morning of admission which has been intermittently sharp with some left arm tingling since that time. Mostly happens when up moving around. This was unrelieved with tramadol but relieved with toradol. No significant edema on exam. He is not complaining of active chest pain at this time. He did not take any of the medications he was previously prescribed. Labs reveal elevated transaminases (AST/ALT 42/69), normal creatinine, mild leukocytosis of 12k, macrocytosis, pre-diabetes with A1C 5.7, BNP 761, negative troponin x 1, UDS + benzos, THC, cocaine. CXR NAD. Tele with intermittent NSVT, mostly brief but one 25 beat run.  He is being treated by internal medicine team for acute on chronic respiratory failure with hypoxemia/COPD exacerbation as well as CHF. Brian [name] had long discussion about his high risk behaviors and patient does report motivation to d/c cocaine completely. However, he does report he "stays anxious all the time" so may benefit from psych consult while in patient.  Past Medical History:  Diagnosis Date  . Alcohol abuse    Heavy alcohol abuse and homelessness  . Arthritis   . CHF (congestive heart failure) (HCC)   . Cocaine abuse (HCC)    And crack   . COPD (chronic obstructive pulmonary disease) (HCC)   . Heart failure   . Homelessness   . Suicide attempt (HCC)   . Tobacco abuse     Past Surgical History:  Procedure Laterality Date  . INNER EAR SURGERY       Inpatient Medications: Scheduled Meds: . aspirin EC  81 mg Oral Daily  . docusate sodium  100 mg Oral BID  . doxycycline  100 mg Oral Q12H  . feeding supplement (ENSURE ENLIVE)  237 mL Oral BID BM  . folic acid  1 mg Oral Daily  . furosemide  40 mg Intravenous Daily  . heparin  5,000 Units Subcutaneous Q8H  . labetalol  100 mg Oral BID  . lisinopril  5 mg Oral Daily  .  mometasone-formoterol  2 puff Inhalation BID  . multivitamin with minerals  1 tablet Oral Daily  . nicotine  21 mg Transdermal Daily  . predniSONE  40 mg Oral Q breakfast  . sodium chloride flush  3 mL Intravenous Q12H  . sodium chloride flush  3 mL Intravenous Q12H  . thiamine  100 mg Oral Daily   Continuous Infusions: . sodium chloride     PRN Meds: sodium chloride, acetaminophen **OR** acetaminophen, albuterol, ketorolac, LORazepam, ondansetron **OR** ondansetron (ZOFRAN) IV, sodium chloride flush, traMADol, traZODone  Home Meds: Prior to Admission medications   Medication Sig Start Date End Date Taking? Authorizing Provider  albuterol (PROVENTIL HFA;VENTOLIN HFA) 108 (90 Base) MCG/ACT inhaler Inhale 2 puffs into the lungs every 6 (six) hours as needed for wheezing or shortness of breath. Patient not taking: Reported on 10/14/2017 10/01/17   Lonia Blood, MD  aspirin EC 81 MG EC tablet Take 1 tablet (81 mg total) by mouth daily. Patient not taking: Reported on 10/14/2017 10/02/17   Lonia Blood, MD  furosemide (LASIX) 20 MG tablet Take 3 tablets (60 mg total) by mouth 2 (two) times daily. Patient not taking: Reported on 10/14/2017 10/01/17   Lonia Blood, MD  labetalol (NORMODYNE) 100 MG tablet Take 1 tablet (100 mg total) by mouth 2 (two) times daily. Patient not taking: Reported on 10/14/2017 10/01/17   Lonia Blood, MD  Lido-Capsaicin-Men-Methyl Sal (1ST MEDX-PATCH/ LIDOCAINE) 4-0.373-08-27 % PTCH Apply 1 patch topically 2 (two) times daily. Apply to the area left of your neck, on the top of your shoulder Patient not taking: Reported on 10/14/2017 10/09/17   Gerhard Munch, MD  lisinopril (PRINIVIL,ZESTRIL) 5 MG tablet Take 1 tablet (5 mg total) by mouth daily. Patient not taking: Reported on 10/14/2017 10/01/17   Lonia Blood, MD  thiamine 100 MG tablet Take 1 tablet (100 mg total) by mouth daily. Patient not taking: Reported on 11/03/2017 10/17/17   Richarda Overlie, MD      Allergies:   No Known Allergies  Social History:   Social History   Socioeconomic History  . Marital status: Divorced    Spouse name: Not on file  . Number of children: Not on file  . Years of education: Not on file  . Highest education level: Not on file  Occupational History  . Occupation: unemployed  Social Needs  . Financial resource strain: Not on file  . Food insecurity:    Worry: Not on file    Inability: Not on file  . Transportation needs:    Medical: Not on file    Non-medical: Not on file  Tobacco Use  . Smoking status: Current Every Day Smoker    Packs/day: 2.00    Types: Cigarettes  . Smokeless tobacco: Never Used  Substance and Sexual Activity  . Alcohol use: Yes    Comment: Heavy. 5-10 40oz daily  . Drug use: No    Types: Marijuana, Cocaine    Comment: Marijuana and cocaine, crack  . Sexual activity: Not Currently  Birth control/protection: None  Lifestyle  . Physical activity:    Days per week: Not on file    Minutes per session: Not on file  . Stress: Not on file  Relationships  . Social connections:    Talks on phone: Not on file    Gets together: Not on file    Attends religious service: Not on file    Active member of club or organization: Not on file    Attends meetings of clubs or organizations: Not on file    Relationship status: Not on file  . Intimate partner violence:    Fear of current or ex partner: Not on file    Emotionally abused: Not on file    Physically abused: Not on file    Forced sexual activity: Not on file  Other Topics Concern  . Not on file  Social History Narrative   Currently homeless. Has poor social support, no family, just friends and police who check on him. Heavy alcohol noted in the past, currently states he drinks "1-2 beers a day" and last drink was earlier today. States he may get minor shakes and anxiety but denies frank EtOH withdrawal, seizures, DT's. States last cocaine and MJ abuse was last week.  Denies IVDU     Family History:   The patient's family history includes Cancer in his sister; Coronary artery disease in his unknown relative; Diabetes in his unknown relative.  ROS:  Please see the history of present illness.  All other ROS reviewed and negative.     Physical Exam/Data:   Vitals:   11/03/17 2109 11/04/17 0549 11/04/17 0600 11/04/17 0849  BP: 139/62 (!) 150/98    Pulse: 85 77 82   Resp: (!) 34 (!) 30 16   Temp: 98.2 F (36.8 C) 97.9 F (36.6 C)    TempSrc: Oral Oral    SpO2: 95% 98% 95% 96%  Weight:  186 lb 14.4 oz (84.8 kg)    Height:        Intake/Output Summary (Last 24 hours) at 11/04/2017 1014 Last data filed at 11/04/2017 0600 Gross per 24 hour  Intake 941 ml  Output 1975 ml  Net -1034 ml   Filed Weights   11/03/17 1257 11/03/17 1732 11/04/17 0549  Weight: 184 lb (83.5 kg) 186 lb (84.4 kg) 186 lb 14.4 oz (84.8 kg)   Body mass index is 30.17 kg/m.  General: Well developed dissheveled appearing WM, in no acute distress. Head: Normocephalic, atraumatic, sclera non-icteric, no xanthomas, nares are without discharge. Bearded Neck: Negative for carotid bruits. JVD not elevated. Lungs: Coarse bilaterally to auscultation without wheezes, rales, or rhonchi. Breathing is unlabored. Heart: RRR with S1 S2. No murmurs, rubs, or gallops appreciated. Abdomen: Soft, non-tender, non-distended with normoactive bowel sounds. No hepatomegaly. No rebound/guarding. No obvious abdominal masses. Msk:  Strength and tone appear normal for age. Extremities: No clubbing or cyanosis. No edema.  Distal pedal pulses are 2+ and equal bilaterally. Neuro: Alert and oriented X 3. No facial asymmetry. No focal deficit. Moves all extremities spontaneously. Psych:  Responds to questions appropriately with a normal affect.  EKG:  The EKG was personally reviewed and demonstrates: Yesterday NSR with nonspecific STT changes Today NSR 86bpm with ST coving V1-V3 but no acute ST  depression  Relevant CV Studies: As above  Laboratory Data:  Chemistry Recent Labs  Lab 11/03/17 1302  NA 143  K 3.7  CL 107  CO2 27  GLUCOSE 197*  BUN 13  CREATININE 0.82  CALCIUM 8.3*  GFRNONAA >60  GFRAA >60  ANIONGAP 9    Recent Labs  Lab 11/03/17 1302  PROT 5.9*  ALBUMIN 3.2*  AST 42*  ALT 69*  ALKPHOS 40  BILITOT 0.7   Hematology Recent Labs  Lab 11/03/17 1302  WBC 12.0*  RBC 4.84  HGB 15.0  HCT 48.6  MCV 100.4*  MCH 31.0  MCHC 30.9  RDW 14.0  PLT 178   Cardiac EnzymesNo results for input(s): TROPONINI in the last 168 hours.  Recent Labs  Lab 11/03/17 1307  TROPIPOC 0.01    BNP Recent Labs  Lab 11/03/17 1302  BNP 761.8*    DDimer  Recent Labs  Lab 11/03/17 1302  DDIMER 0.39    Radiology/Studies:  Dg Chest 2 View  Result Date: 11/03/2017 CLINICAL DATA:  Chest pain and shortness of breath for several hours EXAM: CHEST - 2 VIEW COMPARISON:  10/26/2017 FINDINGS: The heart size and mediastinal contours are within normal limits. Both lungs are clear. The visualized skeletal structures are unremarkable. IMPRESSION: No active cardiopulmonary disease. Electronically Signed   By: Alcide Clever M.D.   On: 11/03/2017 13:53    Assessment and Plan:   1. Chest pain 2. Acute on chronic respiratory failure with hypoxemia/COPD exacerbation 3. Acute on chronic systolic CHF 4. Polysusbtance abuse, ongoing, with homelessness - cocaine - tobacco - alcohol 5. Patient reported severe anxiety 6. Ventricular tachycardia, paroxysmal  Difficult situation. There are many opportunities for optimization of his cardiac regimen but this has proved futile in the last few years given his social situation and noncompliance. Troponin is negative and EKG shows nonspecific changes. He is not currently a candidate for invasive ischemic evaluation (or ICD) given his noncompliance (i.e. It may be a more dangerous situation if this patient were to receive PCI then go home  and not comply with DAPT, as this could potentiate a fatal MI). This can be revisited as outpatient if he proves his compliance. The patient seems motivated to discontinue drug use but obviously remains high risk. We Izola Teague cautiously initiate carvedilol (better for his low EF than labetalol). Continue ACEI. OK to continue Lasix for now (current weight 186, last DC weight 179). He probably would benefit from Imdur for anginal coverage and vasospasm but need to follow blood pressure as it was low on admission, now 150s-90s. Brian [name] had long discussion about high risk behaviors. Unfortunately none of what we do here in the hospital Ericah Scotto have any longstanding effect if he goes home and repeats his habits. He reports severe anxiety at baseline and it appears he is using substances to help cope with this. We would recommend primary team consider inpatient psychiatric consultation to help assist with this, as outpatient follow-up is extremely unreliable in this unfortunate gentleman. Ultimately drug rehab seems to be of utmost importance. Otherwise his prognosis is quite poor.  For questions or updates, please contact [facility] Please consult [email or web address] for contact info under Cardiology/STEMI.    Signed, Laurann Montana, PA-C  11/04/2017 10:14 AM   I have seen and examined this patient with Ronie Spies.  Agree with above, note added to reflect my findings.  On exam, RRR, no murmurs, lungs clear.  Impression of the hospital initially with shortness of breath.  He had chest pain yesterday.  He has a history of chronic systolic heart failure with an ejection fraction of 30 to 35%.  On telemetry he has had runs of nonsustained ventricular tachycardia.  He is currently not on any medications.  The patient does admit to living on the streets.  He also has a history of drug abuse using both opiates and cocaine.  At this point, this is a quite difficult situation.  It is quite likely that he does have an ischemic  cardiomyopathy with coronary artery disease.  He has been seen multiple times by cardiology and has not followed up in clinic.  He is also not been taking his medications as he has been unable to afford them.  He Jahnai Slingerland likely need a social work evaluation to get his medications.  As far as his heart failure and chest pain, it is likely that he does have coronary artery disease.  Unfortunately, he has not been compliant with any medications and thus further evaluation would be fruitless as stenting would require him to be very compliant with antiplatelet medications.  I feel that he needs heart failure medications titrated and to prove that he Aalijah Lanphere be compliant prior to further coronary evaluation.  He also uses cocaine, opiates, and tobacco.  I had a long discussion with him on stopping those substances.  He Chalene Treu have to prove to [place] that he Mykah Shin be compliant with medications and not be on these drugs.  He Eleny Cortez need follow-up in cardiology clinic.  Agree with possible inpatient psychiatry consultation to assist with his substance abuse issues.  Also feel that social work should be consulted.  Lezli Danek M. Valetta Mulroy MD 11/04/2017 10:50 AM

## 2017-11-04 NOTE — Progress Notes (Signed)
[facility]                                                                              Patient Demographics  Brian [name], is a 62 y.o. male, DOB - [birth year], WUJ:[id number]  Admit date - 11/03/2017   Admitting Physician Brian Crocker, MD  Outpatient Primary MD for the patient is Patient, [name]  Outpatient specialists:   LOS - 1  days    Chief Complaint  Patient presents with  . Chest Pain  . Shortness of Breath       Brief summary   62 year old gentleman with history of cardiomyopathy (EF 30-35%) day, consult signs abuse 1 ( marijuana, cocaine, alcohol ) with continued tobacco abuse and COPD presenting with chest pain and shortness of breath associated with chills and productive cough, now admitted for acute on chronic respiratory failure due to COPD exacerbation with decompensated CHF.   Assessment & Plan    Principal Problem:   Acute on chronic respiratory failure with hypoxia (HCC) Active Problems:   Acute exacerbation of chronic obstructive pulmonary disease (COPD) (HCC)   Homelessness   Cocaine abuse (HCC)   Polysubstance (excluding opioids) dependence, daily use (HCC)   Alcohol dependence (HCC)   Substance induced mood disorder (HCC)   Tobacco dependence   Acute on chronic systolic CHF (congestive heart failure), NYHA class 4 (HCC)   Hyperglycemia   1. Acute on chronic hypoxic respiratory failure:   Due to COPD exacerbation with mild acute CHF  echo June 2019- EF30- 35% with diffuse hypokinesis  continue pulmonary toilet measures, supportive care, tobacco cessation counseling   2. Acute COPD exacerbation:  antibiotic  supplemental oxygen   Continue nebulized bronchodilators, steroids, follow-up on respiratory viral panel   3.  Acute on chronic systolic CHF:   Gentle diuresis, daily weights, fluid restriction  cardiology consulted   4. Substance abuse:   Events of withdrawal.  Monitor clinically   5.  Homelessness:  Child psychotherapist consultation done  Code Status:  A full code DVT Prophylaxis:  heparin  Family Communication: Discussed in detail with the patient, all imaging results, lab results explained to the patient   Disposition Plan: TBD  Time Spent in minutes 32 minutes  Procedures:    Consultants:    cardiology  social work  Antimicrobials:      Medications  Scheduled Meds: . aspirin EC  81 mg Oral Daily  . carvedilol  3.125 mg Oral BID WC  . docusate sodium  100 mg Oral BID  . doxycycline  100 mg Oral Q12H  . feeding supplement (ENSURE ENLIVE)  237 mL Oral BID BM  . folic acid  1 mg Oral Daily  . furosemide  40 mg Intravenous Daily  . heparin  5,000 Units Subcutaneous Q8H  . lisinopril  5 mg Oral Daily  . mometasone-formoterol  2 puff Inhalation BID  . multivitamin with minerals  1 tablet Oral Daily  . nicotine  21 mg Transdermal Daily  . predniSONE  40 mg Oral Q breakfast  . sodium chloride flush  3 mL Intravenous Q12H  . sodium chloride flush  3  mL Intravenous Q12H  . thiamine  100 mg Oral Daily   Continuous Infusions: . sodium chloride     PRN Meds:.sodium chloride, acetaminophen **OR** acetaminophen, albuterol, ketorolac, LORazepam, ondansetron **OR** ondansetron (ZOFRAN) IV, sodium chloride flush, traMADol, traZODone   Antibiotics   Anti-infectives (From admission, onward)   Start     Dose/Rate Route Frequency Ordered Stop   11/03/17 2200  doxycycline (VIBRA-TABS) tablet 100 mg     100 mg Oral Every 12 hours 11/03/17 1553          Subjective:   Brian [name] was seen and examined today.  He is breathing a lot better, no chest pain at this moment  Objective:   Vitals:   11/04/17 0600 11/04/17 0849 11/04/17 1040 11/04/17 1042  BP:   (!) 165/100   Pulse: 82   72  Resp: 16     Temp:      TempSrc:      SpO2: 95% 96%    Weight:      Height:        Intake/Output Summary (Last 24 hours) at 11/04/2017 1117 Last data filed at 11/04/2017 1053 Gross  per 24 hour  Intake 1181 ml  Output 1975 ml  Net -794 ml     Wt Readings from Last 3 Encounters:  11/04/17 84.8 kg (186 lb 14.4 oz)  10/27/17 83.9 kg (184 lb 15.5 oz)  10/16/17 82.7 kg (182 lb 6.4 oz)     Exam  General: NAD  HEENT: NCAT,  PERRL,MMM  Neck: SUPPLE, (-) JVD  Cardiovascular: RRR, (-) GALLOP, (-) MURMUR  Respiratory:   Bilateral wheezes a  Gastrointestinal: SOFT, (-) DISTENSION, BS(+), (_) TENDERNESS  Ext: (-) CYANOSIS, (-) EDEMA  Neuro: A, OX 3  Skin:(-) RASH  Psych:NORMAL AFFECT/MOOD   Data Reviewed:  I have personally reviewed following labs and imaging studies  Micro Results Recent Results (from the past 240 hour(s))  MRSA PCR Screening     Status: None   Collection Time: 10/27/17  5:10 AM  Result Value Ref Range Status   MRSA by PCR NEGATIVE NEGATIVE Final    Comment:        The GeneXpert MRSA Assay (FDA approved for NASAL specimens only), is one component of a comprehensive MRSA colonization surveillance program. It is not intended to diagnose MRSA infection nor to guide or monitor treatment for MRSA infections. Performed at [facility], [address] [place], [place] [address]   Respiratory Panel by PCR     Status: None   Collection Time: 11/03/17  6:36 PM  Result Value Ref Range Status   Adenovirus NOT DETECTED NOT DETECTED Final   Coronavirus 229E NOT DETECTED NOT DETECTED Final   Coronavirus HKU1 NOT DETECTED NOT DETECTED Final   Coronavirus NL63 NOT DETECTED NOT DETECTED Final   Coronavirus OC43 NOT DETECTED NOT DETECTED Final   Metapneumovirus NOT DETECTED NOT DETECTED Final   Rhinovirus / Enterovirus NOT DETECTED NOT DETECTED Final   Influenza A NOT DETECTED NOT DETECTED Final   Influenza B NOT DETECTED NOT DETECTED Final   Parainfluenza Virus 1 NOT DETECTED NOT DETECTED Final   Parainfluenza Virus 2 NOT DETECTED NOT DETECTED Final   Parainfluenza Virus 3 NOT DETECTED NOT DETECTED Final   Parainfluenza Virus 4  NOT DETECTED NOT DETECTED Final   Respiratory Syncytial Virus NOT DETECTED NOT DETECTED Final   Bordetella pertussis NOT DETECTED NOT DETECTED Final   Chlamydophila pneumoniae NOT DETECTED NOT DETECTED Final   Mycoplasma  pneumoniae NOT DETECTED NOT DETECTED Final    Comment: Performed at [facility], [address], [place], [place] [address]    Radiology Reports Dg Chest 2 View  Result Date: 11/03/2017 CLINICAL DATA:  Chest pain and shortness of breath for several hours EXAM: CHEST - 2 VIEW COMPARISON:  10/26/2017 FINDINGS: The heart size and mediastinal contours are within normal limits. Both lungs are clear. The visualized skeletal structures are unremarkable. IMPRESSION: No active cardiopulmonary disease. Electronically Signed   By: Alcide Clever M.D.   On: 11/03/2017 13:53   Dg Chest 2 View  Result Date: 10/26/2017 CLINICAL DATA:  Chest pain with shortness of breath. History of alcohol abuse. EXAM: CHEST - 2 VIEW COMPARISON:  10/14/2017. FINDINGS: Heart is enlarged. There is slight vascular congestion. No consolidation or edema. No effusion or pneumothorax. Thoracic atherosclerosis. No osseous findings. IMPRESSION: Cardiomegaly with mild vascular congestion. No consolidation or edema. Electronically Signed   By: Elsie Stain M.D.   On: 10/26/2017 18:56   Dg Chest 2 View  Result Date: 10/14/2017 CLINICAL DATA:  Mid chest pain.  Shortness of breath. EXAM: CHEST - 2 VIEW COMPARISON:  October 09, 2016 FINDINGS: Stable cardiomegaly. The hila and mediastinum are normal. No pneumothorax. No pulmonary nodules or masses. No focal infiltrates. IMPRESSION: No active cardiopulmonary disease. Electronically Signed   By: Gerome Sam III M.D   On: 10/14/2017 16:55   Dg Hand 2 View Right  Result Date: 10/16/2017 CLINICAL DATA:  Right fourth finger pain and swelling after injury finding 3 days ago. EXAM: RIGHT HAND - 2 VIEW COMPARISON:  Radiographs of November 26, 2012. FINDINGS: No acute fracture is  noted. Old healed proximal fifth metacarpal fracture is noted. 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 No other joint space abnormality is noted. No soft tissue abnormality is noted. IMPRESSION: Chronic abnormalities as described above. No acute abnormality seen in the right hand. Electronically Signed   By: Lupita Raider, M.D.   On: 10/16/2017 11:54   Dg Chest Portable 1 View  Result Date: 10/09/2017 CLINICAL DATA:  Left upper arm and left shoulder pain, some numbness in the left hand, shortness of breath, alcohol and drug abuse EXAM: PORTABLE CHEST 1 VIEW COMPARISON:  Portable chest x-ray of 09/27/2016 FINDINGS: Minimally prominent markings remain particular at the left lung base most consistent with atelectasis and possible scarring. No definite pneumonia or effusion is seen. Mediastinal and hilar contours are unremarkable and the heart is mildly enlarged and stable. No acute bony abnormality is seen. IMPRESSION: 1. No active process. 2. Mild left basilar linear atelectasis or scarring. 3. Stable mild cardiomegaly. Electronically Signed   By: Dwyane Dee M.D.   On: 10/09/2017 16:41    Lab Data:  CBC: Recent Labs  Lab 11/03/17 1302  WBC 12.0*  HGB 15.0  HCT 48.6  MCV 100.4*  PLT 178   Basic Metabolic Panel: Recent Labs  Lab 11/03/17 1302  NA 143  K 3.7  CL 107  CO2 27  GLUCOSE 197*  BUN 13  CREATININE 0.82  CALCIUM 8.3*   GFR: Estimated Creatinine Clearance: 99.1 mL/min (by C-G formula based on SCr of 0.82 mg/dL). Liver Function Tests: Recent Labs  Lab 11/03/17 1302  AST 42*  ALT 69*  ALKPHOS 40  BILITOT 0.7  PROT 5.9*  ALBUMIN 3.2*   No results for input(s): LIPASE, AMYLASE in the last 168 hours. No results for input(s): AMMONIA in the last 168 hours. Coagulation Profile: No  results for input(s): INR, PROTIME in the last 168 hours. Cardiac Enzymes: No results for input(s): CKTOTAL, CKMB, CKMBINDEX, TROPONINI in the last 168 hours. BNP (last 3 results) No results for input(s): PROBNP in the last 8760 hours. HbA1C: Recent Labs    11/03/17 1851  HGBA1C 5.7*   CBG: Recent Labs  Lab 10/28/17 1636  GLUCAP 133*   Lipid Profile: No results for input(s): CHOL, HDL, LDLCALC, TRIG, CHOLHDL, LDLDIRECT in the last 72 hours. Thyroid Function Tests: No results for input(s): TSH, T4TOTAL, FREET4, T3FREE, THYROIDAB in the last 72 hours. Anemia Panel: No results for input(s): VITAMINB12, FOLATE, FERRITIN, TIBC, IRON, RETICCTPCT in the last 72 hours. Urine analysis:    Component Value Date/Time   COLORURINE STRAW (A) 11/03/2017 1324   APPEARANCEUR CLEAR 11/03/2017 1324   APPEARANCEUR Clear 10/10/2012 0320   LABSPEC 1.003 (L) 11/03/2017 1324   LABSPEC 1.009 10/10/2012 0320   PHURINE 5.0 11/03/2017 1324   GLUCOSEU NEGATIVE 11/03/2017 1324   GLUCOSEU Negative 10/10/2012 0320   HGBUR NEGATIVE 11/03/2017 1324   BILIRUBINUR NEGATIVE 11/03/2017 1324   BILIRUBINUR Negative 10/10/2012 0320   KETONESUR NEGATIVE 11/03/2017 1324   PROTEINUR NEGATIVE 11/03/2017 1324   UROBILINOGEN 0.2 08/27/2012 0659   NITRITE NEGATIVE 11/03/2017 1324   LEUKOCYTESUR NEGATIVE 11/03/2017 1324   LEUKOCYTESUR Negative 10/10/2012 0320     Jackie Plum M.D. [facility] 11/04/2017, 11:17 AM  Pager: [phone] Between 7am to 7pm - call Pager - [phone]  After 7pm go to [email or web address] - password [email or web address]  Call night coverage person covering after 7pm

## 2017-11-04 NOTE — Progress Notes (Signed)
Patient requesting hamburger.  Dietary states this is not allowed on heart healthy diet due to sodium content.  Patient educated that he has heart failure, is currently receiving IV Lasix and that eating sodium heavy foods will be counterintuitive to his care.  Patient frustrated stating that he wants to leave.  Patient given other options which he is now agreeable to.

## 2017-11-04 NOTE — Progress Notes (Signed)
Telemetry called to notify that patient had 25 beats of VT.  MD on call was notified on this.

## 2017-11-04 NOTE — Consult Note (Signed)
Consultation Note Date: 11/04/2017   Patient Name: Brian [name]  DOB: [birth year]  MRN: [id number]  Age / Sex: 62 y.o., male  PCP: Patient, [name] Referring Physician: Benito Mccreedy, MD  Reason for Consultation: Establishing goals of care and Psychosocial/spiritual support  HPI/Patient Profile: 62 y.o. male  with past medical history of osteoarthritis, COPD, ischemic cardiomyopathy, systolic congestive heart failure with an EF of 30 to 35%, history of alcohol cocaine, polysubstance use disorder admitted on 11/03/2017 with pain.  Patient has also been having runs of V. tach.  He has had multiple admissions over June and July of this year with similar complaints of COPD exacerbation with accompanied V. tach; chest pain in the setting of cocaine abuse.  Consult ordered for goals of care.   Clinical Assessment and Goals of Care: Patient seen, chart reviewed.  Patient has multiple social as well as psychiatric issues that are hampering his ability to care for himself in terms of medication compliance, managing his heart failure.  Patient has been homeless for 13 years.  He currently has lived in a tent for the past 3 years in the same location off of [address]  He shares this campsite with one other man who is "like a brother to me".  They apparently look after each other.  Patient's family is deceased.  He has 1 niece who is involved in his life. She has allowed him to stay with her for brief periods of time but she has just had a baby and he states "I am not going to come in there and impose on her".  He is verbalizing readiness to try to be more compliant, get his medications but as noted, he struggles with homelessness, no vehicle, no money.  He is not on Medicaid or disability.  In the past, patient has been married but then divorced.  He moved in to help take care of his mother, but when his mother  died 54 years ago he became homeless.  He talks about wanting sobriety but also shares that sometimes being out on the street he gets very desperate and lonely.  Crack cocaine is his drug of choice.  Is not willing to go to detox as he has been there before.  He is open to hearing more about sober living communities.  Introduced palliative medicine services to patient as medical care for people with serious illness.  Patient understands the gravity of his health condition but also feels very overwhelmed about how to improve his diet and stay on his medication.  I did ask him if he had ever considered what goals of care, if things were to get worse, what he would or would not want his medical providers to know about his wishes.  He states " I want to live".  But he also goes on to state that he has not really thought about the big picture .   At this point patient can speak for himself.  Where he unable to, his niece, Sharyn Lull  Ensz at [phone] would be his healthcare proxy.  Patient is divorced, no children, no living siblings.  Both parents are deceased    SUMMARY OF RECOMMENDATIONS   Patient clearly would benefit from ongoing goals of care discussions but given his current psychosocial situation I can understand how contemplating the big picture might be difficult for him, when you cannot get your daily needs met Palliative medicine in the community is not an option for this gentleman as he is living [place] a tent in the woods Recommend that social work be involved and pursue potentially long-term, sober living community.  Code Status/Advance Care Planning:  Full code    Symptom Management:   Chest pain: Patient reports continuous chest pain especially when he gets "upset".  Continue with tramadol and Toradol as needed  Anxiety: Continue with Ativan as needed  Palliative Prophylaxis:   Aspiration, Bowel Regimen, Delirium Protocol, Eye Care, Frequent Pain Assessment and Oral  Care  Additional Recommendations (Limitations, Scope, Preferences):  Full Scope Treatment  Psycho-social/Spiritual:   Desire for further Chaplaincy support:no  Additional Recommendations: Referral to Community Resources   Prognosis:   Unable to determine  Discharge Planning: To Be Determined      Primary Diagnoses: Present on Admission: . Acute on chronic systolic CHF (congestive heart failure), NYHA class 4 ([place]) . Polysubstance (excluding opioids) dependence, daily use ([place]) . Tobacco dependence . Acute exacerbation of chronic obstructive pulmonary disease (COPD) ([place]) . Cocaine abuse ([place]) . Acute on chronic respiratory failure with hypoxia ([place]) . Alcohol dependence ([place]) . Substance induced mood disorder ([place]) . Hyperglycemia   I have reviewed the medical record, interviewed the patient and family, and examined the patient. The following aspects are pertinent.  Past Medical History:  Diagnosis Date  . Alcohol abuse    Heavy alcohol abuse and homelessness  . Arthritis   . Cardiomyopathy ([place])   . CHF (congestive heart failure) ([place])   . Cocaine abuse ([place])    And crack   . COPD (chronic obstructive pulmonary disease) ([place])   . Heart failure   . Homelessness   . Noncompliance   . Paroxysmal VT ([place])    a. first seen 2013 in setting of normal LVEF 55-60%, EF now decreased.  . Suicide attempt ([place])   . Tobacco abuse    Social History   Socioeconomic History  . Marital status: Divorced    Spouse name: Not on file  . Number of children: Not on file  . Years of education: Not on file  . Highest education level: Not on file  Occupational History  . Occupation: unemployed  Social Needs  . Financial resource strain: Not on file  . Food insecurity:    Worry: Not on file    Inability: Not on file  . Transportation needs:    Medical: Not on file    Non-medical: Not on file  Tobacco Use  . Smoking status: Current Every Day Smoker    Packs/day: 2.00     Types: Cigarettes  . Smokeless tobacco: Never Used  Substance and Sexual Activity  . Alcohol use: Yes    Comment: Heavy. 5-10 40oz daily  . Drug use: No    Types: Marijuana, Cocaine    Comment: Marijuana and cocaine, crack  . Sexual activity: Not Currently    Birth control/protection: None  Lifestyle  . Physical activity:    Days per week: Not on file    Minutes per session: Not on file  . Stress: Not on  file  Relationships  . Social connections:    Talks on phone: Not on file    Gets together: Not on file    Attends religious service: Not on file    Active member of club or organization: Not on file    Attends meetings of clubs or organizations: Not on file    Relationship status: Not on file  Other Topics Concern  . Not on file  Social History Narrative   Currently homeless. Has poor social support, no family, just friends and police who check on him. Heavy alcohol noted in the past, currently states he drinks "1-2 beers a day" and last drink was earlier today. States he may get minor shakes and anxiety but denies frank EtOH withdrawal, seizures, DT's. States last cocaine and MJ abuse was last week. Denies IVDU    Family History  Problem Relation Age of Onset  . Coronary artery disease Unknown   . Diabetes Unknown   . Cancer Sister    Scheduled Meds: . aspirin EC  81 mg Oral Daily  . carvedilol  3.125 mg Oral BID WC  . docusate sodium  100 mg Oral BID  . doxycycline  100 mg Oral Q12H  . folic acid  1 mg Oral Daily  . furosemide  40 mg Intravenous Daily  . heparin  5,000 Units Subcutaneous Q8H  . lisinopril  5 mg Oral Daily  . mometasone-formoterol  2 puff Inhalation BID  . multivitamin with minerals  1 tablet Oral Daily  . nicotine  21 mg Transdermal Daily  . predniSONE  40 mg Oral Q breakfast  . sodium chloride flush  3 mL Intravenous Q12H  . sodium chloride flush  3 mL Intravenous Q12H  . thiamine  100 mg Oral Daily   Continuous Infusions: . sodium chloride      PRN Meds:.sodium chloride, acetaminophen **OR** acetaminophen, albuterol, ketorolac, LORazepam, ondansetron **OR** ondansetron (ZOFRAN) IV, sodium chloride flush, traMADol, traZODone Medications Prior to Admission:  Prior to Admission medications   Medication Sig Start Date End Date Taking? Authorizing Provider  albuterol (PROVENTIL HFA;VENTOLIN HFA) 108 (90 Base) MCG/ACT inhaler Inhale 2 puffs into the lungs every 6 (six) hours as needed for wheezing or shortness of breath. Patient not taking: Reported on 10/14/2017 10/01/17   Cherene Altes, MD  aspirin EC 81 MG EC tablet Take 1 tablet (81 mg total) by mouth daily. Patient not taking: Reported on 10/14/2017 10/02/17   Cherene Altes, MD  furosemide (LASIX) 20 MG tablet Take 3 tablets (60 mg total) by mouth 2 (two) times daily. Patient not taking: Reported on 10/14/2017 10/01/17   Cherene Altes, MD  labetalol (NORMODYNE) 100 MG tablet Take 1 tablet (100 mg total) by mouth 2 (two) times daily. Patient not taking: Reported on 10/14/2017 10/01/17   Cherene Altes, MD  Lido-Capsaicin-Men-Methyl Sal (1ST MEDX-PATCH/ LIDOCAINE) 4-0.373-08-27 % PTCH Apply 1 patch topically 2 (two) times daily. Apply to the area left of your neck, on the top of your shoulder Patient not taking: Reported on 10/14/2017 10/09/17   Carmin Muskrat, MD  lisinopril (PRINIVIL,ZESTRIL) 5 MG tablet Take 1 tablet (5 mg total) by mouth daily. Patient not taking: Reported on 10/14/2017 10/01/17   Cherene Altes, MD  thiamine 100 MG tablet Take 1 tablet (100 mg total) by mouth daily. Patient not taking: Reported on 11/03/2017 10/17/17   Reyne Dumas, MD   No Known Allergies Review of Systems  Constitutional: Positive for activity change.  HENT:  Negative.   Eyes: Negative.   Respiratory: Positive for chest tightness and shortness of breath.   Cardiovascular: Positive for chest pain.  Gastrointestinal: Negative.   Endocrine: Negative.   Genitourinary: Negative.    Musculoskeletal: Positive for back pain.  Skin: Negative.   Allergic/Immunologic: Negative.   Neurological: Positive for weakness.  Hematological: Negative.   Psychiatric/Behavioral: The patient is nervous/anxious.     Physical Exam  Constitutional: He is oriented to person, place, and time. He appears well-developed and well-nourished.  HENT:  Head: Normocephalic and atraumatic.  Neck: Normal range of motion.  Cardiovascular: Normal rate.  Pulmonary/Chest: Effort normal.  Musculoskeletal: Normal range of motion.  Neurological: He is alert and oriented to person, place, and time.  Skin: Skin is warm and dry.  Psychiatric:  Anxious, irritable  Nursing note and vitals reviewed.   Vital Signs: BP (!) 155/98 (BP Location: Right Arm)   Pulse 85   Temp 98.1 F (36.7 C) (Oral)   Resp (!) 23   Ht '5\' 6"'  (1.676 m)   Wt 84.8 kg (186 lb 14.4 oz)   SpO2 94%   BMI 30.17 kg/m  Pain Scale: 0-10   Pain Score: 9    SpO2: SpO2: 94 % O2 Device:SpO2: 94 % O2 Flow Rate: .O2 Flow Rate (L/min): 1 L/min  IO: Intake/output summary:   Intake/Output Summary (Last 24 hours) at 11/04/2017 1639 Last data filed at 11/04/2017 1406 Gross per 24 hour  Intake 1421 ml  Output 2175 ml  Net -754 ml    LBM: Last BM Date: 11/03/17 Baseline Weight: Weight: 83.5 kg (184 lb) Most recent weight: Weight: 84.8 kg (186 lb 14.4 oz)     Palliative Assessment/Data:   Flowsheet Rows     Most Recent Value  Intake Tab  Referral Department  Hospitalist  Unit at Time of Referral  Med/Surg Unit  Palliative Care Primary Diagnosis  Cardiac  Date Notified  11/03/17  Palliative Care Type  New Palliative care  Date of Admission  11/03/17  Date first seen by Palliative Care  11/04/17  # of days Palliative referral response time  1 Day(s)  # of days IP prior to Palliative referral  0  Clinical Assessment  Palliative Performance Scale Score  50%  Pain Max last 24 hours  Not able to report  Pain Min Last 24  hours  Not able to report  Dyspnea Max Last 24 Hours  Not able to report  Dyspnea Min Last 24 hours  Not able to report  Nausea Max Last 24 Hours  Not able to report  Nausea Min Last 24 Hours  Not able to report  Anxiety Max Last 24 Hours  Not able to report  Anxiety Min Last 24 Hours  Not able to report  Other Max Last 24 Hours  Not able to report  Psychosocial & Spiritual Assessment  Palliative Care Outcomes  Patient/Family meeting held?  Yes  Who was at the meeting?  pt      Time In: 1600 Time Out: 1657 Time Total: 57 min Greater than 50%  of this time was spent counseling and coordinating care related to the above assessment and plan.  Signed by: Dory Horn, NP   Please contact Palliative Medicine Team phone at [phone] for questions and concerns.  For individual provider: See Shea Evans

## 2017-11-05 ENCOUNTER — Encounter (HOSPITAL_COMMUNITY): Payer: Self-pay | Admitting: *Deleted

## 2017-11-05 LAB — COMPREHENSIVE METABOLIC PANEL
ALBUMIN: 3.1 g/dL — AB (ref 3.5–5.0)
ALK PHOS: 36 U/L — AB (ref 38–126)
ALT: 44 U/L (ref 0–44)
AST: 23 U/L (ref 15–41)
Anion gap: 10 (ref 5–15)
BILIRUBIN TOTAL: 0.7 mg/dL (ref 0.3–1.2)
BUN: 21 mg/dL — ABNORMAL HIGH (ref 6–20)
CALCIUM: 8.9 mg/dL (ref 8.9–10.3)
CO2: 32 mmol/L (ref 22–32)
Chloride: 100 mmol/L (ref 98–111)
Creatinine, Ser: 0.93 mg/dL (ref 0.61–1.24)
GFR calc Af Amer: >60 mL/min (ref >60)
GFR calc non Af Amer: >60 mL/min (ref >60)
GLUCOSE: 113 mg/dL — AB (ref 70–99)
Potassium: 4.2 mmol/L (ref 3.5–5.1)
Sodium: 142 mmol/L (ref 135–145)
TOTAL PROTEIN: 6 g/dL — AB (ref 6.5–8.1)

## 2017-11-05 LAB — CBC
HEMATOCRIT: 48.3 % (ref 39.0–52.0)
HEMOGLOBIN: 15.4 g/dL (ref 13.0–17.0)
MCH: 31.8 pg (ref 26.0–34.0)
MCHC: 31.9 g/dL (ref 30.0–36.0)
MCV: 99.6 fL (ref 78.0–100.0)
Platelets: 171 10*3/uL (ref 150–400)
RBC: 4.85 MIL/uL (ref 4.22–5.81)
RDW: 13.6 % (ref 11.5–15.5)
WBC: 11.1 10*3/uL — AB (ref 4.0–10.5)

## 2017-11-05 MED ORDER — FUROSEMIDE 20 MG PO TABS
20.0000 mg | ORAL_TABLET | Freq: Every day | ORAL | Status: DC
  Administered 2017-11-05 – 2017-11-06 (×2): 20 mg via ORAL
  Filled 2017-11-05 (×2): qty 1

## 2017-11-05 MED ORDER — FUROSEMIDE 40 MG PO TABS: 40.0000 mg | ORAL_TABLET | Freq: Every day | ORAL | Status: DC

## 2017-11-05 MED ORDER — CARVEDILOL 6.25 MG PO TABS
6.2500 mg | ORAL_TABLET | Freq: Two times a day (BID) | ORAL | Status: DC
  Administered 2017-11-05 – 2017-11-06 (×3): 6.25 mg via ORAL
  Filled 2017-11-05 (×3): qty 1

## 2017-11-05 MED ORDER — ENOXAPARIN SODIUM 40 MG/0.4ML ~~LOC~~ SOLN
40.0000 mg | SUBCUTANEOUS | Status: DC
  Filled 2017-11-05: qty 0.4

## 2017-11-05 NOTE — Care Management Note (Addendum)
Case Management Note  Patient Details  Name: KADO CARITHERS MRN: [id number] Date of Birth: 03-Dec-1955  Subjective/Objective: Pt presented for SOB- Respiratory Failure. Initiated on IV Lasix. PTA pt was homeless living in a tent near [place]. PT consulted for recommendations and stated patient appropriate for SNF. Pt declined SNF at this time. Patient states he will be returning to [place], however open to resources ex [facility]) .                 Action/Plan: CM did call the [facility] to schedule an appointment. Awaiting call back from [facility] for appointment. Social Work provided resources and bus pass back to tent and to MD appointment. Pt will be able to get medications from the [facility] and [facility]. No further needs from CM at this time.   Expected Discharge Date:                  Expected Discharge Plan:  Home/Self Care(Lives in a tent near [address] )  In-House Referral:  Clinical Social Work  Discharge planning Services  CM Consult, Follow-up appt scheduled, [facility], Medication Assistance  Post Acute Care Choice:  NA Choice offered to:  NA  DME Arranged:  N/A DME Agency:  NA  HH Arranged:  Patient Refused HH HH Agency:  NA  Status of Service:  Completed, signed off  If discussed at [employer] of Stay Meetings, dates discussed:    Additional Comments:  Gala Lewandowsky, RN 11/05/2017, 12:27 PM

## 2017-11-05 NOTE — Progress Notes (Signed)
PROGRESS NOTE  Brian [name] RUE:[id number] DOB: [birth year] DOA: 11/03/2017 PCP: Patient, [name]  HPI/Recap of past 15 hours: 62 year old male with medical history significant for COPD, polysubstance abuse, CHF presents to the ER complaining of chest pain, shortness of breath, productive cough and fatigue.  Of note, patient has had several episodes of admissions in the past with similar presentation, requiring BiPAP.  In the ED, patient was placed on a nonrebreather with chest x-ray unremarkable, urine drug screen positive for cocaine. Of note, patient is homeless.  Patient admitted for further management.  Today, patient reports feeling better, denies any chest pain, worsening shortness of breath, fever/chills.  Assessment/Plan: Principal Problem:   Acute on chronic respiratory failure with hypoxia (HCC) Active Problems:   Acute exacerbation of chronic obstructive pulmonary disease (COPD) (HCC)   Homelessness   Cocaine abuse (HCC)   Polysubstance (excluding opioids) dependence, daily use (HCC)   Alcohol dependence (HCC)   Substance induced mood disorder (HCC)   Tobacco dependence   Acute on chronic systolic CHF (congestive heart failure), NYHA class 4 (HCC)   Hyperglycemia   Goals of care, counseling/discussion   Palliative care by specialist  Acute on chronic hypoxic respiratory failure Saturating well on O2, increase work of breathing Likely due to sedation of COPD and CHF Supplemental oxygen as needed, plan to wean off Management as noted below  Acute COPD exacerbation Afebrile, with mild leukocytosis (on steroids) Respiratory viral panel unremarkable Chest x-ray unremarkable Continue duo nebs, inhalers Continue doxycycline, prednisone Supplemental O2 as needed  Acute on chronic systolic heart failure/ICM Appears euvolemic Echo showed EF of 30 to 35% with diffuse hypokinesis and mild pulmonary hypertension Cardiology on board Strict I's and O's, daily  weight Continue Lasix, Coreg, lisinopril  Chest pain/elevated troponin/paroxysmal ventricular tachycardia Chest pain-free Troponin with flat trend Cardiology on board, no invasive procedure, due to patient's poor compliance.  Patient advised against the use of cocaine and beta-blockers Medical management as above: Aspirin, Coreg  Polysubstance abuse UDS positive for cocaine, also uses marijuana, alcohol abuse (2-3 beers daily) Advised patient to quit Social worker to provide resources  Tobacco abuse Advised to quit Nicotine patch  Homeless Patient refused SNF placement Social worker and case manager on board, assisting patients with follow-up appointments, and medications.    Code Status: Full  Family Communication: None at bedside  Disposition Plan: Home likely 11/06/17   Consultants:  Cardiology  Procedures:  None  Antimicrobials:  Doxycycline  DVT prophylaxis: Lovenox   Objective: Vitals:   11/05/17 0502 11/05/17 0502 11/05/17 0733 11/05/17 1008  BP:  (!) 143/102  (!) 146/98  Pulse:  68  78  Resp:      Temp:  97.7 F (36.5 C)    TempSrc:  Oral    SpO2:  100% 98%   Weight: 85.8 kg (189 lb 3.2 oz)     Height:        Intake/Output Summary (Last 24 hours) at 11/05/2017 1340 Last data filed at 11/05/2017 1039 Gross per 24 hour  Intake 835 ml  Output 1700 ml  Net -865 ml   Filed Weights   11/03/17 1732 11/04/17 0549 11/05/17 0502  Weight: 84.4 kg (186 lb) 84.8 kg (186 lb 14.4 oz) 85.8 kg (189 lb 3.2 oz)    Exam:   General: NAD  Cardiovascular: S1, S2 present  Respiratory: Diminished breath sounds bilaterally  Abdomen: Soft, nontender, nondistended, bowel sounds present  Musculoskeletal: No pedal edema  Skin: Normal  Psychiatry:  Normal mood   Data Reviewed: CBC: Recent Labs  Lab 11/03/17 1302 11/05/17 0445  WBC 12.0* 11.1*  HGB 15.0 15.4  HCT 48.6 48.3  MCV 100.4* 99.6  PLT 178 171   Basic Metabolic Panel: Recent Labs   Lab 11/03/17 1302 11/05/17 0445  NA 143 142  K 3.7 4.2  CL 107 100  CO2 27 32  GLUCOSE 197* 113*  BUN 13 21*  CREATININE 0.82 0.93  CALCIUM 8.3* 8.9   GFR: Estimated Creatinine Clearance: 87.8 mL/min (by C-G formula based on SCr of 0.93 mg/dL). Liver Function Tests: Recent Labs  Lab 11/03/17 1302 11/05/17 0445  AST 42* 23  ALT 69* 44  ALKPHOS 40 36*  BILITOT 0.7 0.7  PROT 5.9* 6.0*  ALBUMIN 3.2* 3.1*   No results for input(s): LIPASE, AMYLASE in the last 168 hours. No results for input(s): AMMONIA in the last 168 hours. Coagulation Profile: No results for input(s): INR, PROTIME in the last 168 hours. Cardiac Enzymes: No results for input(s): CKTOTAL, CKMB, CKMBINDEX, TROPONINI in the last 168 hours. BNP (last 3 results) No results for input(s): PROBNP in the last 8760 hours. HbA1C: Recent Labs    11/03/17 1851  HGBA1C 5.7*   CBG: No results for input(s): GLUCAP in the last 168 hours. Lipid Profile: No results for input(s): CHOL, HDL, LDLCALC, TRIG, CHOLHDL, LDLDIRECT in the last 72 hours. Thyroid Function Tests: No results for input(s): TSH, T4TOTAL, FREET4, T3FREE, THYROIDAB in the last 72 hours. Anemia Panel: No results for input(s): VITAMINB12, FOLATE, FERRITIN, TIBC, IRON, RETICCTPCT in the last 72 hours. Urine analysis:    Component Value Date/Time   COLORURINE STRAW (A) 11/03/2017 1324   APPEARANCEUR CLEAR 11/03/2017 1324   APPEARANCEUR Clear 10/10/2012 0320   LABSPEC 1.003 (L) 11/03/2017 1324   LABSPEC 1.009 10/10/2012 0320   PHURINE 5.0 11/03/2017 1324   GLUCOSEU NEGATIVE 11/03/2017 1324   GLUCOSEU Negative 10/10/2012 0320   HGBUR NEGATIVE 11/03/2017 1324   BILIRUBINUR NEGATIVE 11/03/2017 1324   BILIRUBINUR Negative 10/10/2012 0320   KETONESUR NEGATIVE 11/03/2017 1324   PROTEINUR NEGATIVE 11/03/2017 1324   UROBILINOGEN 0.2 08/27/2012 0659   NITRITE NEGATIVE 11/03/2017 1324   LEUKOCYTESUR NEGATIVE 11/03/2017 1324   LEUKOCYTESUR Negative  10/10/2012 0320   Sepsis Labs: @LABRCNTIP (procalcitonin:4,lacticidven:4)  ) Recent Results (from the past 240 hour(s))  MRSA PCR Screening     Status: None   Collection Time: 10/27/17  5:10 AM  Result Value Ref Range Status   MRSA by PCR NEGATIVE NEGATIVE Final    Comment:        The GeneXpert MRSA Assay (FDA approved for NASAL specimens only), is one component of a comprehensive MRSA colonization surveillance program. It is not intended to diagnose MRSA infection nor to guide or monitor treatment for MRSA infections. Performed at [facility], [address], [place], [place] [address]   Urine culture     Status: Abnormal   Collection Time: 11/03/17  1:24 PM  Result Value Ref Range Status   Specimen Description URINE, RANDOM  Final   Special Requests   Final    NONE Performed at [facility], [address] [place], [place] [address]    Culture MULTIPLE SPECIES PRESENT, SUGGEST RECOLLECTION (A)  Final   Report Status 11/04/2017 FINAL  Final  Respiratory Panel by PCR     Status: None   Collection Time: 11/03/17  6:36 PM  Result Value Ref Range Status   Adenovirus NOT DETECTED NOT  DETECTED Final   Coronavirus 229E NOT DETECTED NOT DETECTED Final   Coronavirus HKU1 NOT DETECTED NOT DETECTED Final   Coronavirus NL63 NOT DETECTED NOT DETECTED Final   Coronavirus OC43 NOT DETECTED NOT DETECTED Final   Metapneumovirus NOT DETECTED NOT DETECTED Final   Rhinovirus / Enterovirus NOT DETECTED NOT DETECTED Final   Influenza A NOT DETECTED NOT DETECTED Final   Influenza B NOT DETECTED NOT DETECTED Final   Parainfluenza Virus 1 NOT DETECTED NOT DETECTED Final   Parainfluenza Virus 2 NOT DETECTED NOT DETECTED Final   Parainfluenza Virus 3 NOT DETECTED NOT DETECTED Final   Parainfluenza Virus 4 NOT DETECTED NOT DETECTED Final   Respiratory Syncytial Virus NOT DETECTED NOT DETECTED Final   Bordetella pertussis NOT DETECTED NOT DETECTED Final   Chlamydophila pneumoniae  NOT DETECTED NOT DETECTED Final   Mycoplasma pneumoniae NOT DETECTED NOT DETECTED Final    Comment: Performed at [facility], [address] [place], [place] [address]      Studies: No results found.  Scheduled Meds: . aspirin EC  81 mg Oral Daily  . carvedilol  6.25 mg Oral BID WC  . docusate sodium  100 mg Oral BID  . doxycycline  100 mg Oral Q12H  . folic acid  1 mg Oral Daily  . furosemide  20 mg Oral Daily  . heparin  5,000 Units Subcutaneous Q8H  . lisinopril  5 mg Oral Daily  . mometasone-formoterol  2 puff Inhalation BID  . multivitamin with minerals  1 tablet Oral Daily  . nicotine  21 mg Transdermal Daily  . predniSONE  40 mg Oral Q breakfast  . sodium chloride flush  3 mL Intravenous Q12H  . sodium chloride flush  3 mL Intravenous Q12H  . thiamine  100 mg Oral Daily    Continuous Infusions: . sodium chloride       LOS: 2 days     Briant Cedar, MD [facility]   If 7PM-7AM, please contact night-coverage [email or web address] Password [email or web address] 11/05/2017, 1:40 PM

## 2017-11-05 NOTE — Clinical Social Work Note (Addendum)
Clinical Social Work Assessment  Patient Details  Name: Brian [name] MRN: [id number] Date of Birth: 1955/05/06  Date of referral:  11/05/17               Reason for consult:  Substance Use/ETOH Abuse, Medication Concerns, Financial Concerns, Transportation, Housing Concerns/Homelessness                Permission sought to share information with:    Permission granted to share information::     Name::        Agency::     Relationship::     Contact Information:     Housing/Transportation Living arrangements for the past 2 months:  Homeless Source of Information:  Patient Patient Interpreter Needed:  None Criminal Activity/Legal Involvement Pertinent to Current Situation/Hospitalization:  No - Comment as needed Significant Relationships:  Friend, Siblings, Other Family Members Lives with:  Self Do you feel safe going back to the place where you live?  Yes Need for family participation in patient care:  No (Coment)  Care giving concerns: Patient is homeless and uninsured. PT recommending SNF. Patient also in need of community resources and support with transportation.   Social Worker assessment / plan: CSW met with patient at bedside. Patient alert and oriented, sitting up in bedside chair. CSW introduced self and role and discussed disposition planning and community resources. Patient declined SNF. Patient requested assistance with transportation to get medications. Patient reported he lives in a tent with a friend. Patient does not have income.  CSW also offered resources for substance use treatment programs, shelter, and the [facility]). [facility] can help connect patient to nurse for medications. Patient declined resources; patient familiar with [facility], but stated they are not able to help him and he does not like it there. CSW left list of resources, shelters, and bus passes for patient on chart to use at discharge. CSW signing off.  Employment status:   Unemployed Forensic scientist:  Self Pay (Medicaid Pending) PT Recommendations:  [place] / Referral to community resources:  [place], Outpatient Substance Abuse Treatment Options, Residential Substance Abuse Treatment Options, [facility] [place], Other (Comment Required)([facility])  Patient/Family's Response to care: Patient appreciative of resources.  Patient/Family's Understanding of and Emotional Response to Diagnosis, Current Treatment, and Prognosis: Patient with understanding of conditions. Declining SNF and planning to return to community.  Emotional Assessment Appearance:  Appears stated age Attitude/Demeanor/Rapport:  Engaged Affect (typically observed):  Calm, Appropriate, Frustrated Orientation:  Oriented to Self, Oriented to Place, Oriented to  Time, Oriented to Situation Alcohol / Substance use:  Illicit Drugs, Alcohol Use Psych involvement (Current and /or in the community):  No (Comment)  Discharge Needs  Concerns to be addressed:  Discharge Planning Concerns, Care Coordination Readmission within the last 30 days:  Yes Current discharge risk:  Physical Impairment, Homeless Barriers to Discharge:  Continued Medical Work up, Homeless with medical needs   Estanislado Emms, LCSW 11/05/2017, 11:47 AM

## 2017-11-05 NOTE — Progress Notes (Addendum)
Progress Note  Patient Name: Brian [name] Date of Encounter: 11/05/2017  Primary Cardiologist: Nanetta Batty, MD   Subjective   No chest pain or dyspnea. Wants to change his lifestyle.   Inpatient Medications    Scheduled Meds: . aspirin EC  81 mg Oral Daily  . carvedilol  3.125 mg Oral BID WC  . docusate sodium  100 mg Oral BID  . doxycycline  100 mg Oral Q12H  . folic acid  1 mg Oral Daily  . furosemide  40 mg Intravenous Daily  . heparin  5,000 Units Subcutaneous Q8H  . lisinopril  5 mg Oral Daily  . mometasone-formoterol  2 puff Inhalation BID  . multivitamin with minerals  1 tablet Oral Daily  . nicotine  21 mg Transdermal Daily  . predniSONE  40 mg Oral Q breakfast  . sodium chloride flush  3 mL Intravenous Q12H  . sodium chloride flush  3 mL Intravenous Q12H  . thiamine  100 mg Oral Daily   Continuous Infusions: . sodium chloride     PRN Meds: sodium chloride, acetaminophen **OR** acetaminophen, albuterol, ketorolac, LORazepam, ondansetron **OR** ondansetron (ZOFRAN) IV, sodium chloride flush, traMADol, traZODone   Vital Signs    Vitals:   11/04/17 2123 11/05/17 0502 11/05/17 0502 11/05/17 0733  BP: 133/90  (!) 143/102   Pulse: 74  68   Resp:      Temp: 98.1 F (36.7 C)  97.7 F (36.5 C)   TempSrc: Oral  Oral   SpO2: 96%  100% 98%  Weight:  189 lb 3.2 oz (85.8 kg)    Height:        Intake/Output Summary (Last 24 hours) at 11/05/2017 0905 Last data filed at 11/05/2017 0506 Gross per 24 hour  Intake 1312 ml  Output 1425 ml  Net -113 ml   Filed Weights   11/03/17 1732 11/04/17 0549 11/05/17 0502  Weight: 186 lb (84.4 kg) 186 lb 14.4 oz (84.8 kg) 189 lb 3.2 oz (85.8 kg)    Telemetry    SR with PVCs and few NSVT - Personally Reviewed  ECG    N/A  Physical Exam   GEN: No acute distress.   Neck: JVD difficult to assess due to beard Cardiac: RRR, no murmurs, rubs, or gallops.  Respiratory: Clear to auscultation bilaterally. GI: Soft,  nontender, non-distended  MS: No edema; No deformity. Neuro:  Nonfocal  Psych: Normal affect   Labs    Chemistry Recent Labs  Lab 11/03/17 1302 11/05/17 0445  NA 143 142  K 3.7 4.2  CL 107 100  CO2 27 32  GLUCOSE 197* 113*  BUN 13 21*  CREATININE 0.82 0.93  CALCIUM 8.3* 8.9  PROT 5.9* 6.0*  ALBUMIN 3.2* 3.1*  AST 42* 23  ALT 69* 44  ALKPHOS 40 36*  BILITOT 0.7 0.7  GFRNONAA >60 >60  GFRAA >60 >60  ANIONGAP 9 10     Hematology Recent Labs  Lab 11/03/17 1302 11/05/17 0445  WBC 12.0* 11.1*  RBC 4.84 4.85  HGB 15.0 15.4  HCT 48.6 48.3  MCV 100.4* 99.6  MCH 31.0 31.8  MCHC 30.9 31.9  RDW 14.0 13.6  PLT 178 171    Recent Labs  Lab 11/03/17 1307  TROPIPOC 0.01     BNP Recent Labs  Lab 11/03/17 1302  BNP 761.8*     DDimer  Recent Labs  Lab 11/03/17 1302  DDIMER 0.39     Radiology    Dg Chest  2 View  Result Date: 11/03/2017 CLINICAL DATA:  Chest pain and shortness of breath for several hours EXAM: CHEST - 2 VIEW COMPARISON:  10/26/2017 FINDINGS: The heart size and mediastinal contours are within normal limits. Both lungs are clear. The visualized skeletal structures are unremarkable. IMPRESSION: No active cardiopulmonary disease. Electronically Signed   By: Alcide Clever M.D.   On: 11/03/2017 13:53    Cardiac Studies   Echo 09/27/17 Study Conclusions  - Left ventricle: The cavity size was mildly dilated. Wall   thickness was normal. Systolic function was moderately to   severely reduced. The estimated ejection fraction was in the   range of 30% to 35%. Diffuse hypokinesis. Features are consistent   with a pseudonormal left ventricular filling pattern, with   concomitant abnormal relaxation and increased filling pressure   (grade 2 diastolic dysfunction). - Aortic valve: There was no stenosis. - Mitral valve: There was trivial regurgitation. - Left atrium: The atrium was mildly dilated. - Right ventricle: The cavity size was mildly  dilated. Systolic   function was normal. - Right atrium: The atrium was mildly to moderately dilated. - Tricuspid valve: Peak RV-RA gradient (S): 40 mm Hg. - Pulmonary arteries: PA peak pressure: 48 mm Hg (S). - Systemic veins: IVC measured 2.5 cm with > 50% respirophasic   variation, suggesting RA pressure 8 mmHg. - Pericardium, extracardiac: A trivial pericardial effusion was   identified.  Impressions:  - Mildly dilated LV with EF 30-35%, diffuse hypokinesis. Mildly   dilated RV with normal systolic function. No significant valvular   abnormalities. Mild pulmonary hypertension.   Patient Profile     Brian [name] is a 62 y.o. male with a hx of Polysubstance abuse (alcohol, cocaine, THC), hoomelessness, paroxysmal VT (first identified 2013), cardiomyopathy (last EF 30-35%), tobacco abuse, arthritis, COPD, prior suicide attempt admitted for acute on chronic hypoxic respiratory failure 2nd to COPD exacerbation and acute CHF. Cardiology is consulted for Chest pain.   Assessment & Plan    1. Chest pain 2. Acute on chronic respiratory failure with hypoxemia/COPD exacerbation 3. Acute on chronic systolic CHF 4. Polysusbtance abuse, ongoing, with homelessness - cocaine - tobacco - alcohol 5. Patient reported severe anxiety 6. Ventricular tachycardia, paroxysmal  Did not felt candidate for invasive evaluation due to non compliance. He might have underlying CAD. Plan to optimize medical therapy for CHF. Once compliance plan do further evaluation as outpatient. He is eager to change his lifestyle. Heart failure education given. Net I & O -925cc. Renal function stable. He is euvolemic. Will change lasix to PO 20mg  qd. Increase coreg to 6.25mg  BID. Continue ASA 81mg  Qd and Lisinopril 5mg  qd (up titrate as blood pressure allows).   No results found for requested labs within last 8760 hours. Will check lipid in AM and add statin after wards.   For questions or updates, please contact  [facility] Please consult [email or web address] for contact info under Cardiology/STEMI.      Signed, Manson Passey, PA  11/05/2017, 9:05 AM    History and all data above reviewed.  Patient examined.  He is breathing somewhat better.  No chest pain.  No acute distress.   I agree with the findings as above.  The patient exam reveals COR:RRR  ,  Lungs: clear  ,  Abd: Positive bowel sounds, no rebound no guarding, Ext No edema  .  All available labs, radiology testing, previous records reviewed. Agree with documented assessment and plan.   Acute on chronic  systolic HF:  Agree with med changes as above and changing to PO.   He has no means of getting his meds outpatient but primary team and social services are apparently working on this.    Brian [name]  10:09 AM  11/05/2017

## 2017-11-06 DIAGNOSIS — F10220 Alcohol dependence with intoxication, uncomplicated: Secondary | ICD-10-CM

## 2017-11-06 DIAGNOSIS — F17200 Nicotine dependence, unspecified, uncomplicated: Secondary | ICD-10-CM

## 2017-11-06 DIAGNOSIS — F1410 Cocaine abuse, uncomplicated: Secondary | ICD-10-CM

## 2017-11-06 DIAGNOSIS — J441 Chronic obstructive pulmonary disease with (acute) exacerbation: Secondary | ICD-10-CM

## 2017-11-06 DIAGNOSIS — I5023 Acute on chronic systolic (congestive) heart failure: Principal | ICD-10-CM

## 2017-11-06 DIAGNOSIS — Z590 Homelessness: Secondary | ICD-10-CM

## 2017-11-06 DIAGNOSIS — F1920 Other psychoactive substance dependence, uncomplicated: Secondary | ICD-10-CM

## 2017-11-06 LAB — CBC WITH DIFFERENTIAL/PLATELET
ABS IMMATURE GRANULOCYTES: 0.1 10*3/uL (ref 0.0–0.1)
BASOS PCT: 0 %
Basophils Absolute: 0 10*3/uL (ref 0.0–0.1)
Eosinophils Absolute: 0 10*3/uL (ref 0.0–0.7)
Eosinophils Relative: 0 %
HCT: 49.6 % (ref 39.0–52.0)
Hemoglobin: 15.9 g/dL (ref 13.0–17.0)
Immature Granulocytes: 1 %
Lymphocytes Relative: 19 %
Lymphs Abs: 2 10*3/uL (ref 0.7–4.0)
MCH: 31.3 pg (ref 26.0–34.0)
MCHC: 32.1 g/dL (ref 30.0–36.0)
MCV: 97.6 fL (ref 78.0–100.0)
MONO ABS: 0.9 10*3/uL (ref 0.1–1.0)
Monocytes Relative: 9 %
NEUTROS ABS: 7.4 10*3/uL (ref 1.7–7.7)
Neutrophils Relative %: 71 %
PLATELETS: 176 10*3/uL (ref 150–400)
RBC: 5.08 MIL/uL (ref 4.22–5.81)
RDW: 13.7 % (ref 11.5–15.5)
WBC: 10.4 10*3/uL (ref 4.0–10.5)

## 2017-11-06 LAB — BASIC METABOLIC PANEL
Anion gap: 5 (ref 5–15)
BUN: 23 mg/dL — AB (ref 6–20)
CALCIUM: 9.3 mg/dL (ref 8.9–10.3)
CO2: 34 mmol/L — ABNORMAL HIGH (ref 22–32)
CREATININE: 0.82 mg/dL (ref 0.61–1.24)
Chloride: 100 mmol/L (ref 98–111)
GFR calc Af Amer: >60 mL/min (ref >60)
Glucose, Bld: 96 mg/dL (ref 70–99)
POTASSIUM: 3.9 mmol/L (ref 3.5–5.1)
SODIUM: 139 mmol/L (ref 135–145)

## 2017-11-06 LAB — LIPID PANEL
Cholesterol: 168 mg/dL (ref 0–200)
HDL: 80 mg/dL (ref >40)
LDL Cholesterol: 76 mg/dL (ref 0–99)
TRIGLYCERIDES: 61 mg/dL (ref <150)
Total CHOL/HDL Ratio: 2.1 RATIO
VLDL: 12 mg/dL (ref 0–40)

## 2017-11-06 MED ORDER — DOXYCYCLINE HYCLATE 100 MG PO TABS: 100.0000 mg | ORAL_TABLET | Freq: Two times a day (BID) | ORAL | 0 refills | Status: DC

## 2017-11-06 MED ORDER — FOLIC ACID 1 MG PO TABS: 1.0000 mg | ORAL_TABLET | Freq: Every day | ORAL | 0 refills | Status: DC

## 2017-11-06 MED ORDER — ASPIRIN 81 MG PO TBEC: 81.0000 mg | DELAYED_RELEASE_TABLET | Freq: Every day | ORAL | 0 refills | Status: DC

## 2017-11-06 MED ORDER — ALBUTEROL SULFATE HFA 108 (90 BASE) MCG/ACT IN AERS: 2.0000 | INHALATION_SPRAY | Freq: Four times a day (QID) | RESPIRATORY_TRACT | 0 refills | Status: DC | PRN

## 2017-11-06 MED ORDER — LISINOPRIL 5 MG PO TABS: 5.0000 mg | ORAL_TABLET | Freq: Every day | ORAL | 0 refills | Status: DC

## 2017-11-06 MED ORDER — TRAMADOL HCL 50 MG PO TABS: 50.0000 mg | ORAL_TABLET | Freq: Two times a day (BID) | ORAL | 0 refills | Status: AC | PRN

## 2017-11-06 MED ORDER — MOMETASONE FURO-FORMOTEROL FUM 200-5 MCG/ACT IN AERO: 2.0000 | INHALATION_SPRAY | Freq: Two times a day (BID) | RESPIRATORY_TRACT | 0 refills | Status: DC

## 2017-11-06 MED ORDER — CARVEDILOL 6.25 MG PO TABS: 6.2500 mg | ORAL_TABLET | Freq: Two times a day (BID) | ORAL | 0 refills | Status: DC

## 2017-11-06 MED ORDER — FUROSEMIDE 20 MG PO TABS: 20.0000 mg | ORAL_TABLET | Freq: Every day | ORAL | 0 refills | Status: DC

## 2017-11-06 MED ORDER — PREDNISONE 20 MG PO TABS: 40.0000 mg | ORAL_TABLET | Freq: Every day | ORAL | 0 refills | Status: DC

## 2017-11-06 NOTE — Discharge Summary (Signed)
Discharge Summary  Brian [name] AVW:[id number] DOB: 1955/09/16  PCP: Patient, [name]  Admit date: 11/03/2017 Discharge date: 11/06/2017  Time spent: 40 mins  Recommendations for Outpatient Follow-up:  1. PCP 2. Cardiology   Discharge Diagnoses:  Active Hospital Problems   Diagnosis Date Noted  . Acute on chronic respiratory failure with hypoxia (HCC) 09/27/2017  . Goals of care, counseling/discussion   . Palliative care by specialist   . Acute on chronic systolic CHF (congestive heart failure), NYHA class 4 (HCC) 11/03/2017  . Hyperglycemia 11/03/2017  . Tobacco dependence 09/27/2017  . Substance induced mood disorder (HCC) 02/04/2013  . Polysubstance (excluding opioids) dependence, daily use (HCC) 02/03/2013  . Alcohol dependence (HCC) 02/03/2013  . Acute exacerbation of chronic obstructive pulmonary disease (COPD) (HCC)   . Homelessness   . Cocaine abuse [facility])     Resolved Hospital Problems  No resolved problems to display.    Discharge Condition: Stable  Diet recommendation: Heart healthy  Vitals:   11/06/17 0446 11/06/17 0847  BP: (!) 143/105   Pulse: 70   Resp:    Temp: 98 F (36.7 C)   SpO2: 97% 97%    History of present illness:  62 year old male with medical history significant for COPD, polysubstance abuse, CHF presents to the ER complaining of chest pain, shortness of breath, productive cough and fatigue.  Of note, patient has had several episodes of admissions in the past with similar presentation, requiring BiPAP.  In the ED, patient was placed on a nonrebreather with chest x-ray unremarkable, urine drug screen positive for cocaine. Of note, patient is homeless.  Patient admitted for further management.  Today, patient reports feeling better, denies any chest pain, worsening shortness of breath, fever/chills. Eager to be discharged back to his tent where he camps. Refusinf SNF.   Hospital Course:  Principal Problem:   Acute on chronic  respiratory failure with hypoxia (HCC) Active Problems:   Acute exacerbation of chronic obstructive pulmonary disease (COPD) (HCC)   Homelessness   Cocaine abuse (HCC)   Polysubstance (excluding opioids) dependence, daily use (HCC)   Alcohol dependence (HCC)   Substance induced mood disorder (HCC)   Tobacco dependence   Acute on chronic systolic CHF (congestive heart failure), NYHA class 4 (HCC)   Hyperglycemia   Goals of care, counseling/discussion   Palliative care by specialist   Acute on chronic hypoxic respiratory failure Saturating well on RA, no increase work of breathing Likely due to exacerbation of COPD and CHF Management as noted below  Acute COPD exacerbation Afebrile, with resolved leukocytosis Respiratory viral panel unremarkable Chest x-ray unremarkable Continue inhalers Continue doxycycline, prednisone for 5 days  Acute on chronic systolic heart failure/ICM Appears euvolemic Echo showed EF of 30 to 35% with diffuse hypokinesis and mild pulmonary hypertension Cardiology on board continue Lasix, Coreg, lisinopril  Chest pain/elevated troponin/paroxysmal ventricular tachycardia Chest pain-free Troponin with flat trend Cardiology on board, no invasive procedure, due to patient's poor compliance.  Patient advised against the use of cocaine and beta-blockers Medical management as above: Aspirin, Coreg  Polysubstance abuse UDS positive for cocaine, also uses marijuana, alcohol abuse (2-3 beers daily) Advised patient to quit Social worker to provide resources  Tobacco abuse Advised to quit  Homeless Patient refused SNF placement Social worker and case manager on board, arranged patients with follow-up appointments, and cheaper plan for medications.      Procedures:  None  Consultations:  Cardiology  Discharge Exam: BP (!) 143/105 (BP Location: Left Arm)  Pulse 70   Temp 98 F (36.7 C) (Oral)   Resp (!) 23   Ht 5\' 6"  (1.676 m)   Wt  86.1 kg (189 lb 12.8 oz)   SpO2 97%   BMI 30.63 kg/m   General: NAD  Cardiovascular: S1, S2 present Respiratory: Diminished BS bilaterally   Discharge Instructions You were cared for by a hospitalist during your hospital stay. If you have any questions about your discharge medications or the care you received while you were in the hospital after you are discharged, you can call the unit and asked to speak with the hospitalist on call if the hospitalist that took care of you is not available. Once you are discharged, your primary care physician will handle any further medical issues. Please note that NO REFILLS for any discharge medications will be authorized once you are discharged, as it is imperative that you return to your primary care physician (or establish a relationship with a primary care physician if you do not have one) for your aftercare needs so that they can reassess your need for medications and monitor your lab values.   Allergies as of 11/06/2017   No Known Allergies     Medication List    STOP taking these medications   labetalol 100 MG tablet Commonly known as:  NORMODYNE   Lido-Capsaicin-Men-Methyl Sal 4-0.373-08-27 % Ptch Commonly known as:  1ST MEDX-PATCH/ LIDOCAINE   thiamine 100 MG tablet     TAKE these medications   albuterol 108 (90 Base) MCG/ACT inhaler Commonly known as:  PROVENTIL HFA;VENTOLIN HFA Inhale 2 puffs into the lungs every 6 (six) hours as needed for wheezing or shortness of breath.   aspirin 81 MG EC tablet Take 1 tablet (81 mg total) by mouth daily. Start taking on:  11/07/2017   carvedilol 6.25 MG tablet Commonly known as:  COREG Take 1 tablet (6.25 mg total) by mouth 2 (two) times daily with a meal.   doxycycline 100 MG tablet Commonly known as:  VIBRA-TABS Take 1 tablet (100 mg total) by mouth every 12 (twelve) hours for 3 doses.   folic acid 1 MG tablet Commonly known as:  FOLVITE Take 1 tablet (1 mg total) by mouth  daily. Start taking on:  11/07/2017   furosemide 20 MG tablet Commonly known as:  LASIX Take 1 tablet (20 mg total) by mouth daily. Start taking on:  11/07/2017 What changed:    how much to take  when to take this   lisinopril 5 MG tablet Commonly known as:  PRINIVIL,ZESTRIL Take 1 tablet (5 mg total) by mouth daily.   mometasone-formoterol 200-5 MCG/ACT Aero Commonly known as:  DULERA Inhale 2 puffs into the lungs 2 (two) times daily.   predniSONE 20 MG tablet Commonly known as:  DELTASONE Take 2 tablets (40 mg total) by mouth daily with breakfast for 2 days. Start taking on:  11/07/2017   traMADol 50 MG tablet Commonly known as:  ULTRAM Take 1 tablet (50 mg total) by mouth every 12 (twelve) hours as needed for up to 5 days for moderate pain.      No Known Allergies Follow-up Information    [facility] Follow up.   Specialty:  Pension scheme manager information: [employer] [facility] [employer] [address] [place] [address] [phone]        [facility]. Go on 11/08/2017.   Why:  @10 :10 am with Sindy Messing for hospital  follow up appointment. If you can not make this appointment please call the office to reschedule.  Contact information: [place] [address] [phone]       [facility]. Go to.   Why:  this location for assistance with medications. Medications will range in cost from $4.00-$10.00.  Contact information: [address] [place] [address] [phone]           The results of significant diagnostics from this hospitalization (including imaging, microbiology, ancillary and laboratory) are listed below for reference.    Significant Diagnostic Studies: Dg Chest 2 View  Result Date: 11/03/2017 CLINICAL DATA:  Chest pain and shortness of breath for several hours EXAM:  CHEST - 2 VIEW COMPARISON:  10/26/2017 FINDINGS: The heart size and mediastinal contours are within normal limits. Both lungs are clear. The visualized skeletal structures are unremarkable. IMPRESSION: No active cardiopulmonary disease. Electronically Signed   By: Alcide Clever M.D.   On: 11/03/2017 13:53   Dg Chest 2 View  Result Date: 10/26/2017 CLINICAL DATA:  Chest pain with shortness of breath. History of alcohol abuse. EXAM: CHEST - 2 VIEW COMPARISON:  10/14/2017. FINDINGS: Heart is enlarged. There is slight vascular congestion. No consolidation or edema. No effusion or pneumothorax. Thoracic atherosclerosis. No osseous findings. IMPRESSION: Cardiomegaly with mild vascular congestion. No consolidation or edema. Electronically Signed   By: Elsie Stain M.D.   On: 10/26/2017 18:56   Dg Chest 2 View  Result Date: 10/14/2017 CLINICAL DATA:  Mid chest pain.  Shortness of breath. EXAM: CHEST - 2 VIEW COMPARISON:  October 09, 2016 FINDINGS: Stable cardiomegaly. The hila and mediastinum are normal. No pneumothorax. No pulmonary nodules or masses. No focal infiltrates. IMPRESSION: No active cardiopulmonary disease. Electronically Signed   By: Gerome Sam III M.D   On: 10/14/2017 16:55   Dg Hand 2 View Right  Result Date: 10/16/2017 CLINICAL DATA:  Right fourth finger pain and swelling after injury finding 3 days ago. EXAM: RIGHT HAND - 2 VIEW COMPARISON:  Radiographs of November 26, 2012. FINDINGS: No acute fracture is noted. Old healed proximal fifth metacarpal fracture is noted. 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 No other joint space abnormality is noted. No soft tissue abnormality is noted. IMPRESSION: Chronic abnormalities as described above. No acute abnormality seen in the right hand. Electronically Signed   By:  Lupita Raider, M.D.   On: 10/16/2017 11:54   Dg Chest Portable 1 View  Result Date: 10/09/2017 CLINICAL DATA:  Left upper arm and left shoulder pain, some numbness in the left hand, shortness of breath, alcohol and drug abuse EXAM: PORTABLE CHEST 1 VIEW COMPARISON:  Portable chest x-ray of 09/27/2016 FINDINGS: Minimally prominent markings remain particular at the left lung base most consistent with atelectasis and possible scarring. No definite pneumonia or effusion is seen. Mediastinal and hilar contours are unremarkable and the heart is mildly enlarged and stable. No acute bony abnormality is seen. IMPRESSION: 1. No active process. 2. Mild left basilar linear atelectasis or scarring. 3. Stable mild cardiomegaly. Electronically Signed   By: Dwyane Dee M.D.   On: 10/09/2017 16:41    Microbiology: Recent Results (from the past 240 hour(s))  Urine culture     Status: Abnormal   Collection Time: 11/03/17  1:24 PM  Result Value Ref Range Status   Specimen Description URINE, RANDOM  Final   Special Requests   Final    NONE Performed at [facility], [address] [place], [place] [address]    Culture MULTIPLE SPECIES PRESENT, SUGGEST RECOLLECTION (A)  Final   Report Status 11/04/2017 FINAL  Final  Respiratory Panel by PCR     Status: None   Collection Time: 11/03/17  6:36 PM  Result Value Ref Range Status   Adenovirus NOT DETECTED NOT DETECTED Final   Coronavirus 229E NOT DETECTED NOT DETECTED Final   Coronavirus HKU1 NOT DETECTED NOT DETECTED Final   Coronavirus NL63 NOT DETECTED NOT DETECTED Final   Coronavirus OC43 NOT DETECTED NOT DETECTED Final   Metapneumovirus NOT DETECTED NOT DETECTED Final   Rhinovirus / Enterovirus NOT DETECTED NOT DETECTED Final   Influenza A NOT DETECTED NOT DETECTED Final   Influenza B NOT DETECTED NOT DETECTED Final   Parainfluenza Virus 1 NOT DETECTED NOT DETECTED Final   Parainfluenza Virus 2 NOT DETECTED NOT DETECTED Final   Parainfluenza Virus  3 NOT DETECTED NOT DETECTED Final   Parainfluenza Virus 4 NOT DETECTED NOT DETECTED Final   Respiratory Syncytial Virus NOT DETECTED NOT DETECTED Final   Bordetella pertussis NOT DETECTED NOT DETECTED Final   Chlamydophila pneumoniae NOT DETECTED NOT DETECTED Final   Mycoplasma pneumoniae NOT DETECTED NOT DETECTED Final    Comment: Performed at [facility], [address], [place], [place] [address]     Labs: Basic Metabolic Panel: Recent Labs  Lab 11/03/17 1302 11/05/17 0445 11/06/17 0501  NA 143 142 139  K 3.7 4.2 3.9  CL 107 100 100  CO2 27 32 34*  GLUCOSE 197* 113* 96  BUN 13 21* 23*  CREATININE 0.82 0.93 0.82  CALCIUM 8.3* 8.9 9.3   Liver Function Tests: Recent Labs  Lab 11/03/17 1302 11/05/17 0445  AST 42* 23  ALT 69* 44  ALKPHOS 40 36*  BILITOT 0.7 0.7  PROT 5.9* 6.0*  ALBUMIN 3.2* 3.1*   No results for input(s): LIPASE, AMYLASE in the last 168 hours. No results for input(s): AMMONIA in the last 168 hours. CBC: Recent Labs  Lab 11/03/17 1302 11/05/17 0445 11/06/17 0501  WBC 12.0* 11.1* 10.4  NEUTROABS  --   --  7.4  HGB 15.0 15.4 15.9  HCT 48.6 48.3 49.6  MCV 100.4* 99.6 97.6  PLT 178 171 176   Cardiac Enzymes: No results for input(s): CKTOTAL, CKMB, CKMBINDEX, TROPONINI in the last 168 hours. BNP: BNP (last 3 results) Recent Labs    10/14/17 1612 10/26/17 1920 11/03/17 1302  BNP 577.3* 258.0* 761.8*    ProBNP (last 3 results) No results for input(s): PROBNP in the last 8760 hours.  CBG: No results for input(s): GLUCAP in the last 168 hours.     Signed:  Briant Cedar, MD [facility] 11/06/2017, 12:31 PM

## 2017-11-06 NOTE — Progress Notes (Signed)
Progress Note  Patient Name: Brian [name] Date of Encounter: 11/06/2017  Primary Cardiologist:   Nanetta Batty, MD   Subjective   He is breathing OK.  He wants to be discharged.  No chest pain  Inpatient Medications    Scheduled Meds: . aspirin EC  81 mg Oral Daily  . carvedilol  6.25 mg Oral BID WC  . docusate sodium  100 mg Oral BID  . doxycycline  100 mg Oral Q12H  . enoxaparin (LOVENOX) injection  40 mg Subcutaneous Q24H  . folic acid  1 mg Oral Daily  . furosemide  20 mg Oral Daily  . lisinopril  5 mg Oral Daily  . mometasone-formoterol  2 puff Inhalation BID  . multivitamin with minerals  1 tablet Oral Daily  . nicotine  21 mg Transdermal Daily  . predniSONE  40 mg Oral Q breakfast  . sodium chloride flush  3 mL Intravenous Q12H  . sodium chloride flush  3 mL Intravenous Q12H  . thiamine  100 mg Oral Daily   Continuous Infusions: . sodium chloride     PRN Meds: sodium chloride, acetaminophen **OR** acetaminophen, albuterol, ketorolac, LORazepam, ondansetron **OR** ondansetron (ZOFRAN) IV, sodium chloride flush, traMADol, traZODone   Vital Signs    Vitals:   11/05/17 2144 11/06/17 0445 11/06/17 0446 11/06/17 0847  BP: (!) 135/95  (!) 143/105   Pulse: 68  70   Resp:      Temp: 97.8 F (36.6 C)  98 F (36.7 C)   TempSrc: Oral  Oral   SpO2: 97%  97% 97%  Weight:  189 lb 12.8 oz (86.1 kg)    Height:        Intake/Output Summary (Last 24 hours) at 11/06/2017 1009 Last data filed at 11/06/2017 0727 Gross per 24 hour  Intake 705 ml  Output 420 ml  Net 285 ml   Filed Weights   11/04/17 0549 11/05/17 0502 11/06/17 0445  Weight: 186 lb 14.4 oz (84.8 kg) 189 lb 3.2 oz (85.8 kg) 189 lb 12.8 oz (86.1 kg)    Telemetry    NSVT 5 beats and frequent ectopy. NSR.  - Personally Reviewed  ECG    NA - Personally Reviewed  Physical Exam   GEN: No acute distress.   Neck: No  JVD Cardiac: RRR, no murmurs, rubs, or gallops.  Respiratory: Clear  to  auscultation bilaterally. GI: Soft, nontender, non-distended  MS: No  edema; No deformity. Neuro:  Nonfocal  Psych: Normal affect   Labs    Chemistry Recent Labs  Lab 11/03/17 1302 11/05/17 0445 11/06/17 0501  NA 143 142 139  K 3.7 4.2 3.9  CL 107 100 100  CO2 27 32 34*  GLUCOSE 197* 113* 96  BUN 13 21* 23*  CREATININE 0.82 0.93 0.82  CALCIUM 8.3* 8.9 9.3  PROT 5.9* 6.0*  --   ALBUMIN 3.2* 3.1*  --   AST 42* 23  --   ALT 69* 44  --   ALKPHOS 40 36*  --   BILITOT 0.7 0.7  --   GFRNONAA >60 >60 >60  GFRAA >60 >60 >60  ANIONGAP 9 10 5      Hematology Recent Labs  Lab 11/03/17 1302 11/05/17 0445 11/06/17 0501  WBC 12.0* 11.1* 10.4  RBC 4.84 4.85 5.08  HGB 15.0 15.4 15.9  HCT 48.6 48.3 49.6  MCV 100.4* 99.6 97.6  MCH 31.0 31.8 31.3  MCHC 30.9 31.9 32.1  RDW 14.0 13.6 13.7  PLT 178 171 176    Cardiac EnzymesNo results for input(s): TROPONINI in the last 168 hours.  Recent Labs  Lab 11/03/17 1307  TROPIPOC 0.01     BNP Recent Labs  Lab 11/03/17 1302  BNP 761.8*     DDimer  Recent Labs  Lab 11/03/17 1302  DDIMER 0.39     Radiology    No results found.  Cardiac Studies    Echo 09/27/17 Study Conclusions  - Left ventricle: The cavity size was mildly dilated. Wall thickness was normal. Systolic function was moderately to severely reduced. The estimated ejection fraction was in the range of 30% to 35%. Diffuse hypokinesis. Features are consistent with a pseudonormal left ventricular filling pattern, with concomitant abnormal relaxation and increased filling pressure (grade 2 diastolic dysfunction). - Aortic valve: There was no stenosis. - Mitral valve: There was trivial regurgitation. - Left atrium: The atrium was mildly dilated. - Right ventricle: The cavity size was mildly dilated. Systolic function was normal. - Right atrium: The atrium was mildly to moderately dilated. - Tricuspid valve: Peak RV-RA gradient (S): 40 mm  Hg. - Pulmonary arteries: PA peak pressure: 48 mm Hg (S). - Systemic veins: IVC measured 2.5 cm with > 50% respirophasic variation, suggesting RA pressure 8 mmHg. - Pericardium, extracardiac: A trivial pericardial effusion was identified.  Impressions:  - Mildly dilated LV with EF 30-35%, diffuse hypokinesis. Mildly dilated RV with normal systolic function. No significant valvular abnormalities. Mild pulmonary hypertension.    Patient Profile     62 y.o. male with a hx of Polysubstance abuse (alcohol, cocaine, THC), hoomelessness, paroxysmal VT (first identified 2013), cardiomyopathy (last EF 30-35%), tobacco abuse, arthritis, COPD, prior suicide attempt admitted for acute on chronic hypoxic respiratory failure 2nd to COPD exacerbation and acute CHF. Cardiology is consulted for Chest pain.     Assessment & Plan    CHEST PAIN:  Enzymes negative.   No further work up .  Continue medication management and lifestyle management.  He does not want to live in a NH.  He will live in his tent with a friend.  I don't know how he will be getting his meds although [facility] might be an option. He has a referral to [facility].    ACUTE ON CHRONIC SYSTOLIC HF FAILURE:  Changed to PO Lasix yesterday.  I/O look incomplete.  Increased beta blocker.    Continue current meds.     For questions or updates, please contact [facility] Please consult [email or web address] for contact info under Cardiology/STEMI.   Signed, Rollene Rotunda, MD  11/06/2017, 10:09 AM

## 2017-11-08 ENCOUNTER — Ambulatory Visit (INDEPENDENT_AMBULATORY_CARE_PROVIDER_SITE_OTHER): Payer: Self-pay | Admitting: Physician Assistant

## 2017-11-08 ENCOUNTER — Other Ambulatory Visit: Payer: Self-pay

## 2017-11-08 ENCOUNTER — Encounter (INDEPENDENT_AMBULATORY_CARE_PROVIDER_SITE_OTHER): Payer: Self-pay | Admitting: Physician Assistant

## 2017-11-08 VITALS — BP 162/107 | HR 85 | Temp 98.3°F | Ht 66.0 in | Wt 196.0 lb

## 2017-11-08 DIAGNOSIS — M79602 Pain in left arm: Secondary | ICD-10-CM

## 2017-11-08 DIAGNOSIS — Z720 Tobacco use: Secondary | ICD-10-CM

## 2017-11-08 DIAGNOSIS — I509 Heart failure, unspecified: Secondary | ICD-10-CM

## 2017-11-08 DIAGNOSIS — Z1211 Encounter for screening for malignant neoplasm of colon: Secondary | ICD-10-CM

## 2017-11-08 DIAGNOSIS — Z1159 Encounter for screening for other viral diseases: Secondary | ICD-10-CM

## 2017-11-08 DIAGNOSIS — Z23 Encounter for immunization: Secondary | ICD-10-CM

## 2017-11-08 DIAGNOSIS — Z09 Encounter for follow-up examination after completed treatment for conditions other than malignant neoplasm: Secondary | ICD-10-CM

## 2017-11-08 DIAGNOSIS — I10 Essential (primary) hypertension: Secondary | ICD-10-CM

## 2017-11-08 MED ORDER — LISINOPRIL 5 MG PO TABS: 5.0000 mg | ORAL_TABLET | Freq: Every day | ORAL | 5 refills | Status: DC

## 2017-11-08 MED ORDER — CARVEDILOL 6.25 MG PO TABS: 6.2500 mg | ORAL_TABLET | Freq: Two times a day (BID) | ORAL | 11 refills | Status: DC

## 2017-11-08 MED ORDER — CLONIDINE HCL 0.1 MG PO TABS
0.1000 mg | ORAL_TABLET | Freq: Once | ORAL | Status: AC
  Administered 2017-11-08: 0.1 mg via ORAL

## 2017-11-08 MED ORDER — NITROGLYCERIN 0.4 MG SL SUBL: 0.4000 mg | SUBLINGUAL_TABLET | SUBLINGUAL | 2 refills | Status: DC | PRN

## 2017-11-08 MED ORDER — NICOTINE 21 MG/24HR TD PT24: 21.0000 mg | MEDICATED_PATCH | Freq: Every day | TRANSDERMAL | 0 refills | Status: AC

## 2017-11-08 MED ORDER — ALBUTEROL SULFATE HFA 108 (90 BASE) MCG/ACT IN AERS: 2.0000 | INHALATION_SPRAY | Freq: Four times a day (QID) | RESPIRATORY_TRACT | 5 refills | Status: DC | PRN

## 2017-11-08 MED ORDER — DOXYCYCLINE HYCLATE 100 MG PO TABS: 100.0000 mg | ORAL_TABLET | Freq: Two times a day (BID) | ORAL | 0 refills | Status: AC

## 2017-11-08 MED ORDER — FUROSEMIDE 20 MG PO TABS: 20.0000 mg | ORAL_TABLET | Freq: Every day | ORAL | 5 refills | Status: DC

## 2017-11-08 MED ORDER — ASPIRIN 81 MG PO TBEC: 81.0000 mg | DELAYED_RELEASE_TABLET | Freq: Every day | ORAL | 3 refills | Status: DC

## 2017-11-08 MED ORDER — MOMETASONE FURO-FORMOTEROL FUM 200-5 MCG/ACT IN AERO: 2.0000 | INHALATION_SPRAY | Freq: Two times a day (BID) | RESPIRATORY_TRACT | 5 refills | Status: DC

## 2017-11-08 MED ORDER — FOLIC ACID 1 MG PO TABS: 1.0000 mg | ORAL_TABLET | Freq: Every day | ORAL | 5 refills | Status: DC

## 2017-11-08 MED ORDER — PREDNISONE 20 MG PO TABS: 40.0000 mg | ORAL_TABLET | Freq: Every day | ORAL | 0 refills | Status: AC

## 2017-11-08 MED FILL — NICOTINE 21 MG/24HR PATCH: 21 | 28 days supply | Qty: 28 | Fill #0

## 2017-11-08 MED FILL — !PROVENTIL HFA 90 MCG INH: 108 (90 BAS | 25 days supply | Qty: 1 | Fill #0

## 2017-11-08 MED FILL — FOLIC ACID 1 MG TABS: 1 | 30 days supply | Qty: 30 | Fill #0

## 2017-11-08 MED FILL — FUROSEMIDE 20 MG TABLET: 20 | 30 days supply | Qty: 30 | Fill #0

## 2017-11-08 MED FILL — predniSONE 20 MG TABS: 20 | 2 days supply | Qty: 4 | Fill #0

## 2017-11-08 MED FILL — NITROSTAT 0.4 MG TABLET SL: 0.4 | 25 days supply | Qty: 25 | Fill #0

## 2017-11-08 MED FILL — CARVEDILOL 6.25 MG TABLET: 6.25 | 30 days supply | Qty: 60 | Fill #0

## 2017-11-08 MED FILL — DOXYCYCLINE HYCLATE 100 MG: 100 | 2 days supply | Qty: 3 | Fill #0

## 2017-11-08 MED FILL — !DULERA 200 MCG/5 MCG INH: 200-5 | 30 days supply | Qty: 13 | Fill #0

## 2017-11-08 NOTE — Progress Notes (Signed)
Subjective:  Patient ID: Brian [name], male    DOB: December 12, 1955  Age: 62 y.o. MRN: [id number]  CC: hospital discharge f/u  HPI Brian [name] is a 62 y.o. male with a medical history of alcohol abuse, cocaine abuse, tobacco abuse, cardiomyopathy, CHF, COPD, paroxysmal VT, suicide attempt, homelessness, and noncompliance presents as a new patient on hospital discharge f/u. Admitted on 11/03/17 and discharged on 11/06/17 with diagnosis of acute on chronic respiratory failure with many active problems to include polysubstance abuse. Pt states he is feeling better after hospital discharge but continues with left arm pain. Denies CP, palpitations, SOB, HA, weakness, tingling, numbness, back pain, jaw pain, diaphoresis, injury, or limited aROM. Has not picked up medications from pharmacy yet. However, niece is with him and says she will pick up medications for patient today. Pt states he is in a hurry to meet with housing authority personnel and prefers this encounter to go more rapidly. Says he does not have an appointment with housing representative and was told to get there when he can. He is currently homeless. When asked about his polysubstance abuse, patient says he does not want to enter into a substance abuse rehabilitation program. Niece says she will try to get him to one.       Outpatient Medications Prior to Visit  Medication Sig Dispense Refill  . albuterol (PROVENTIL HFA;VENTOLIN HFA) 108 (90 Base) MCG/ACT inhaler Inhale 2 puffs into the lungs every 6 (six) hours as needed for wheezing or shortness of breath. (Patient not taking: Reported on 11/08/2017) 1 Inhaler 0  . aspirin EC 81 MG EC tablet Take 1 tablet (81 mg total) by mouth daily. (Patient not taking: Reported on 11/08/2017) 30 tablet 0  . carvedilol (COREG) 6.25 MG tablet Take 1 tablet (6.25 mg total) by mouth 2 (two) times daily with a meal. (Patient not taking: Reported on 11/08/2017) 60 tablet 0  . doxycycline (VIBRA-TABS) 100 MG  tablet Take 1 tablet (100 mg total) by mouth every 12 (twelve) hours for 3 doses. (Patient not taking: Reported on 11/08/2017) 3 tablet 0  . folic acid (FOLVITE) 1 MG tablet Take 1 tablet (1 mg total) by mouth daily. (Patient not taking: Reported on 11/08/2017) 30 tablet 0  . furosemide (LASIX) 20 MG tablet Take 1 tablet (20 mg total) by mouth daily. (Patient not taking: Reported on 11/08/2017) 30 tablet 0  . lisinopril (PRINIVIL,ZESTRIL) 5 MG tablet Take 1 tablet (5 mg total) by mouth daily. (Patient not taking: Reported on 11/08/2017) 30 tablet 0  . mometasone-formoterol (DULERA) 200-5 MCG/ACT AERO Inhale 2 puffs into the lungs 2 (two) times daily. (Patient not taking: Reported on 11/08/2017) 1 Inhaler 0  . predniSONE (DELTASONE) 20 MG tablet Take 2 tablets (40 mg total) by mouth daily with breakfast for 2 days. (Patient not taking: Reported on 11/08/2017) 4 tablet 0  . traMADol (ULTRAM) 50 MG tablet Take 1 tablet (50 mg total) by mouth every 12 (twelve) hours as needed for up to 5 days for moderate pain. (Patient not taking: Reported on 11/08/2017) 20 tablet 0   No facility-administered medications prior to visit.      ROS Review of Systems  Constitutional: Negative for chills, fever and malaise/fatigue.  Eyes: Negative for blurred vision.  Respiratory: Negative for shortness of breath.   Cardiovascular: Negative for chest pain and palpitations.  Gastrointestinal: Negative for abdominal pain and nausea.  Genitourinary: Negative for dysuria and hematuria.  Musculoskeletal: Negative for joint pain and myalgias.  Left arm pain  Skin: Negative for rash.  Neurological: Negative for tingling and headaches.  Psychiatric/Behavioral: Negative for depression. The patient is not nervous/anxious.     Objective:  BP (!) 162/112 (BP Location: Left Arm, Patient Position: Sitting, Cuff Size: Normal)   Pulse 79   Temp 98.3 F (36.8 C) (Oral)   Ht 5\' 6"  (1.676 m)   Wt 196 lb (88.9 kg)   SpO2 92%   BMI  31.64 kg/m   BP/Weight 11/08/2017 11/06/2017 10/28/2017  Systolic BP 162 143 134  Diastolic BP 112 105 86  Wt. (Lbs) 196 189.8 -  BMI 31.64 30.63 -  Some encounter information is confidential and restricted. Go to Review Flowsheets activity to see all data.      Physical Exam  Constitutional: He is oriented to person, place, and time.  Well developed, well nourished, NAD, polite  HENT:  Head: Normocephalic and atraumatic.  Eyes: No scleral icterus.  Neck: Normal range of motion. Neck supple. No thyromegaly present.  Cardiovascular: Normal rate, regular rhythm and normal heart sounds.  Pulmonary/Chest: Effort normal and breath sounds normal.  Abdominal: Soft. Bowel sounds are normal. There is no tenderness.  Musculoskeletal: He exhibits no edema.  Neurological: He is alert and oriented to person, place, and time. No cranial nerve deficit. Coordination normal.  Skin: Skin is warm and dry. No rash noted. No erythema. No pallor.  Psychiatric: He has a normal mood and affect. His behavior is normal. Thought content normal.  Vitals reviewed.    Assessment & Plan:   1. Hospital discharge follow-up - Refill mometasone-formoterol (DULERA) 200-5 MCG/ACT AERO; Inhale 2 puffs into the lungs 2 (two) times daily.  Dispense: 1 Inhaler; Refill: 5 - Refill folic acid (FOLVITE) 1 MG tablet; Take 1 tablet (1 mg total) by mouth daily.  Dispense: 30 tablet; Refill: 5 - Refill predniSONE (DELTASONE) 20 MG tablet; Take 2 tablets (40 mg total) by mouth daily with breakfast for 2 days.  Dispense: 4 tablet; Refill: 0 - Refill doxycycline (VIBRA-TABS) 100 MG tablet; Take 1 tablet (100 mg total) by mouth every 12 (twelve) hours for 3 doses.  Dispense: 3 tablet; Refill: 0 - Refill albuterol (PROVENTIL HFA;VENTOLIN HFA) 108 (90 Base) MCG/ACT inhaler; Inhale 2 puffs into the lungs every 6 (six) hours as needed for wheezing or shortness of breath.  Dispense: 1 Inhaler; Refill: 5  2. Essential hypertension -  administered cloNIDine (CATAPRES) tablet 0.1 mg - Basic Metabolic Panel - Refill lisinopril (PRINIVIL,ZESTRIL) 5 MG tablet; Take 1 tablet (5 mg total) by mouth daily.  Dispense: 30 tablet; Refill: 5 - Refill furosemide (LASIX) 20 MG tablet; Take 1 tablet (20 mg total) by mouth daily.  Dispense: 30 tablet; Refill: 5 - Refill carvedilol (COREG) 6.25 MG tablet; Take 1 tablet (6.25 mg total) by mouth 2 (two) times daily with a meal.  Dispense: 60 tablet; Refill: 11 - Refill aspirin 81 MG EC tablet; Take 1 tablet (81 mg total) by mouth daily.  Dispense: 90 tablet; Refill: 3 - Refill nitroGLYCERIN (NITROSTAT) 0.4 MG SL tablet; Place 1 tablet (0.4 mg total) under the tongue every 5 (five) minutes as needed for chest pain.  Dispense: 25 tablet; Refill: 2  3. Left arm pain - EKG 12-Lead similar to hospital EKG. Advised pt to monitor for symptoms of MI and to report to ED for evaluation if his left arm pain persists. Pt education on MI given to pt.  - Begin PRN nitroGLYCERIN (NITROSTAT) 0.4 MG SL tablet; Place  1 tablet (0.4 mg total) under the tongue every 5 (five) minutes as needed for chest pain.  Dispense: 25 tablet; Refill: 2  4. Need for hepatitis C screening test - Hepatitis c antibody (reflex)  5. Need for Tdap vaccination - Tdap vaccine greater than or equal to 7yo IM  6. Screening for colon cancer - Fecal occult blood, imunochemical  7. Congestive heart failure, unspecified HF chronicity, unspecified heart failure type [facility] [place] [place]) - Ambulatory referral to Cardiology  8. Tobacco abuse - nicotine (NICODERM CQ - DOSED IN MG/24 HOURS) 21 mg/24hr patch; Place 1 patch (21 mg total) onto the skin daily.  Dispense: 28 patch; Refill: 0  * Advised to begin CAFA process for possible future referrals.     Meds ordered this encounter  Medications  . cloNIDine (CATAPRES) tablet 0.1 mg  . nicotine (NICODERM CQ - DOSED IN MG/24 HOURS) 21 mg/24hr patch    Sig: Place 1 patch (21 mg total) onto the skin  daily.    Dispense:  28 patch    Refill:  0    Order Specific Question:   Supervising Provider    Answer:   Hoy Register [[id number]]  . mometasone-formoterol (DULERA) 200-5 MCG/ACT AERO    Sig: Inhale 2 puffs into the lungs 2 (two) times daily.    Dispense:  1 Inhaler    Refill:  5    Order Specific Question:   Supervising Provider    Answer:   Hoy Register [[id number]]  . lisinopril (PRINIVIL,ZESTRIL) 5 MG tablet    Sig: Take 1 tablet (5 mg total) by mouth daily.    Dispense:  30 tablet    Refill:  5    Order Specific Question:   Supervising Provider    Answer:   Hoy Register [[id number]]  . furosemide (LASIX) 20 MG tablet    Sig: Take 1 tablet (20 mg total) by mouth daily.    Dispense:  30 tablet    Refill:  5    Order Specific Question:   Supervising Provider    Answer:   Hoy Register [[id number]]  . folic acid (FOLVITE) 1 MG tablet    Sig: Take 1 tablet (1 mg total) by mouth daily.    Dispense:  30 tablet    Refill:  5    Order Specific Question:   Supervising Provider    Answer:   Hoy Register [[id number]]  . predniSONE (DELTASONE) 20 MG tablet    Sig: Take 2 tablets (40 mg total) by mouth daily with breakfast for 2 days.    Dispense:  4 tablet    Refill:  0    Order Specific Question:   Supervising Provider    Answer:   Hoy Register [[id number]]  . doxycycline (VIBRA-TABS) 100 MG tablet    Sig: Take 1 tablet (100 mg total) by mouth every 12 (twelve) hours for 3 doses.    Dispense:  3 tablet    Refill:  0    Order Specific Question:   Supervising Provider    Answer:   Hoy Register [[id number]]  . carvedilol (COREG) 6.25 MG tablet    Sig: Take 1 tablet (6.25 mg total) by mouth 2 (two) times daily with a meal.    Dispense:  60 tablet    Refill:  11    Order Specific Question:   Supervising Provider    Answer:   Hoy Register [[id number]]  . aspirin 81 MG EC tablet    Sig: Take 1 tablet (81 mg  total) by mouth daily.    Dispense:  90 tablet    Refill:  3    Order Specific Question:    Supervising Provider    Answer:   Hoy Register [[id number]]  . albuterol (PROVENTIL HFA;VENTOLIN HFA) 108 (90 Base) MCG/ACT inhaler    Sig: Inhale 2 puffs into the lungs every 6 (six) hours as needed for wheezing or shortness of breath.    Dispense:  1 Inhaler    Refill:  5    Order Specific Question:   Supervising Provider    Answer:   Hoy Register [[id number]]  . nitroGLYCERIN (NITROSTAT) 0.4 MG SL tablet    Sig: Place 1 tablet (0.4 mg total) under the tongue every 5 (five) minutes as needed for chest pain.    Dispense:  25 tablet    Refill:  2    Order Specific Question:   Supervising Provider    Answer:   Hoy Register [[id number]]    Follow-up: Return in about 1 month (around 12/06/2017) for HTN.   [name] PA

## 2017-11-08 NOTE — Progress Notes (Signed)
Patients niece will drop off scripts today and pick up for patient

## 2017-11-08 NOTE — Patient Instructions (Addendum)
I advise that you go to the emergency department if your left arm pain persists throughout the day. Please read the following to help identify other symptoms associated with heart attack.    Heart Attack A heart attack (myocardial infarction, MI) causes damage to the heart that cannot be fixed. A heart attack often happens when a blood clot or other blockage cuts blood flow to the heart. When this happens, certain areas of the heart begin to die. This causes the pain you feel during a heart attack. Follow these instructions at home:  Take medicine as told by your doctor. You may need medicine to: ? Keep your blood from clotting too easily. ? Control your blood pressure. ? Lower your cholesterol. ? Control abnormal heart rhythms.  Change certain behaviors as told by your doctor. This may include: ? Quitting smoking. ? Being active. ? Eating a heart-healthy diet. Ask your doctor for help with this diet. ? Keeping a healthy weight. ? Keeping your diabetes under control. ? Lessening stress. ? Limiting how much alcohol you drink. Do not take these medicines unless your doctor says that you can:  Nonsteroidal anti-inflammatory drugs (NSAIDs). These include: ? Ibuprofen. ? Naproxen. ? Celecoxib.  Vitamin supplements that have vitamin A, vitamin E, or both.  Hormone therapy that contains estrogen with or without progestin.  Get help right away if:  You have sudden chest discomfort.  You have sudden discomfort in your: ? Arms. ? Back. ? Neck. ? Jaw.  You have shortness of breath at any time.  You have sudden sweating or clammy skin.  You feel sick to your stomach (nauseous) or throw up (vomit).  You suddenly get light-headed or dizzy.  You feel your heart beating fast or skipping beats. These symptoms may be an emergency. Do not wait to see if the symptoms will go away. Get medical help right away. Call your local emergency services (911 in the [place] Do not drive yourself  to the hospital. This information is not intended to replace advice given to you by your health care provider. Make sure you discuss any questions you have with your health care provider. Document Released: 09/26/2011 Document Revised: 09/02/2015 Document Reviewed: 05/30/2013 Elsevier Interactive Patient Education  2017 [employer].

## 2017-11-09 ENCOUNTER — Telehealth (INDEPENDENT_AMBULATORY_CARE_PROVIDER_SITE_OTHER): Payer: Self-pay

## 2017-11-09 LAB — BASIC METABOLIC PANEL
BUN / CREAT RATIO: 20 (ref 9–20)
BUN: 18 mg/dL (ref 6–24)
CHLORIDE: 98 mmol/L (ref 96–106)
CO2: 32 mmol/L — ABNORMAL HIGH (ref 20–29)
Calcium: 9.7 mg/dL (ref 8.7–10.2)
Creatinine, Ser: 0.88 mg/dL (ref 0.76–1.27)
GFR calc Af Amer: 109 mL/min/{1.73_m2} (ref >59)
GFR calc non Af Amer: 94 mL/min/{1.73_m2} (ref >59)
GLUCOSE: 97 mg/dL (ref 65–99)
Potassium: 5 mmol/L (ref 3.5–5.2)
SODIUM: 143 mmol/L (ref 134–144)

## 2017-11-09 LAB — HEPATITIS C ANTIBODY (REFLEX)

## 2017-11-09 LAB — COMMENT2 - HEP PANEL

## 2017-11-09 NOTE — Telephone Encounter (Signed)
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 He will need further testing. Kidney filtration normal.

## 2017-11-09 NOTE — Telephone Encounter (Signed)
Patient was not available. Results provided to his niece. She will inform patient that HCV was positive meaning patient has had exposure to HCV, will need further testing. And kidney filtration normal. Maryjean Morn, CMA

## 2017-11-12 ENCOUNTER — Other Ambulatory Visit: Payer: Self-pay

## 2017-11-12 ENCOUNTER — Inpatient Hospital Stay (HOSPITAL_COMMUNITY)
Admission: EM | Admit: 2017-11-12 | Discharge: 2017-11-16 | DRG: 190 | Discharge status: Against Medical Advice | Payer: Medicaid Other | Attending: Internal Medicine | Admitting: Internal Medicine

## 2017-11-12 ENCOUNTER — Emergency Department (HOSPITAL_COMMUNITY): Payer: Medicaid Other

## 2017-11-12 DIAGNOSIS — Z590 Homelessness: Secondary | ICD-10-CM

## 2017-11-12 DIAGNOSIS — F1010 Alcohol abuse, uncomplicated: Secondary | ICD-10-CM | POA: Diagnosis present

## 2017-11-12 DIAGNOSIS — K210 Gastro-esophageal reflux disease with esophagitis: Secondary | ICD-10-CM | POA: Diagnosis not present

## 2017-11-12 DIAGNOSIS — Z7151 Drug abuse counseling and surveillance of drug abuser: Secondary | ICD-10-CM

## 2017-11-12 DIAGNOSIS — I5022 Chronic systolic (congestive) heart failure: Secondary | ICD-10-CM | POA: Diagnosis present

## 2017-11-12 DIAGNOSIS — Z79899 Other long term (current) drug therapy: Secondary | ICD-10-CM

## 2017-11-12 DIAGNOSIS — F17210 Nicotine dependence, cigarettes, uncomplicated: Secondary | ICD-10-CM | POA: Diagnosis present

## 2017-11-12 DIAGNOSIS — R1310 Dysphagia, unspecified: Secondary | ICD-10-CM | POA: Diagnosis present

## 2017-11-12 DIAGNOSIS — R739 Hyperglycemia, unspecified: Secondary | ICD-10-CM | POA: Diagnosis present

## 2017-11-12 DIAGNOSIS — I428 Other cardiomyopathies: Secondary | ICD-10-CM | POA: Diagnosis present

## 2017-11-12 DIAGNOSIS — R112 Nausea with vomiting, unspecified: Secondary | ICD-10-CM | POA: Diagnosis not present

## 2017-11-12 DIAGNOSIS — I110 Hypertensive heart disease with heart failure: Secondary | ICD-10-CM | POA: Diagnosis present

## 2017-11-12 DIAGNOSIS — Z7982 Long term (current) use of aspirin: Secondary | ICD-10-CM

## 2017-11-12 DIAGNOSIS — J9600 Acute respiratory failure, unspecified whether with hypoxia or hypercapnia: Secondary | ICD-10-CM | POA: Diagnosis present

## 2017-11-12 DIAGNOSIS — J9601 Acute respiratory failure with hypoxia: Secondary | ICD-10-CM | POA: Diagnosis present

## 2017-11-12 DIAGNOSIS — F1920 Other psychoactive substance dependence, uncomplicated: Secondary | ICD-10-CM | POA: Diagnosis present

## 2017-11-12 DIAGNOSIS — Z915 Personal history of self-harm: Secondary | ICD-10-CM

## 2017-11-12 DIAGNOSIS — J441 Chronic obstructive pulmonary disease with (acute) exacerbation: Principal | ICD-10-CM | POA: Diagnosis present

## 2017-11-12 DIAGNOSIS — M109 Gout, unspecified: Secondary | ICD-10-CM | POA: Diagnosis present

## 2017-11-12 DIAGNOSIS — T364X5A Adverse effect of tetracyclines, initial encounter: Secondary | ICD-10-CM | POA: Diagnosis not present

## 2017-11-12 MED ORDER — KETOROLAC TROMETHAMINE 30 MG/ML IJ SOLN
30.0000 mg | Freq: Once | INTRAMUSCULAR | Status: AC
  Administered 2017-11-13: 30 mg via INTRAVENOUS
  Filled 2017-11-12: qty 1

## 2017-11-12 MED ORDER — ALBUTEROL (5 MG/ML) CONTINUOUS INHALATION SOLN
10.0000 mg/h | INHALATION_SOLUTION | Freq: Once | RESPIRATORY_TRACT | Status: AC
  Administered 2017-11-12: 10 mg/h via RESPIRATORY_TRACT
  Filled 2017-11-12: qty 20

## 2017-11-12 NOTE — ED Triage Notes (Signed)
Patient is homeless; c/o CP with SOB. States that he took one nitro when the CP started. EMS gave 1 duoneb 125 solumed , 324 asa, and 1 nitro w/ no improvement

## 2017-11-13 ENCOUNTER — Encounter (HOSPITAL_COMMUNITY): Payer: Self-pay | Admitting: Internal Medicine

## 2017-11-13 DIAGNOSIS — I5022 Chronic systolic (congestive) heart failure: Secondary | ICD-10-CM

## 2017-11-13 DIAGNOSIS — F17210 Nicotine dependence, cigarettes, uncomplicated: Secondary | ICD-10-CM | POA: Diagnosis present

## 2017-11-13 DIAGNOSIS — I428 Other cardiomyopathies: Secondary | ICD-10-CM | POA: Diagnosis present

## 2017-11-13 DIAGNOSIS — Z7982 Long term (current) use of aspirin: Secondary | ICD-10-CM | POA: Diagnosis not present

## 2017-11-13 DIAGNOSIS — Z7151 Drug abuse counseling and surveillance of drug abuser: Secondary | ICD-10-CM | POA: Diagnosis not present

## 2017-11-13 DIAGNOSIS — F1920 Other psychoactive substance dependence, uncomplicated: Secondary | ICD-10-CM | POA: Diagnosis present

## 2017-11-13 DIAGNOSIS — J441 Chronic obstructive pulmonary disease with (acute) exacerbation: Principal | ICD-10-CM

## 2017-11-13 DIAGNOSIS — I110 Hypertensive heart disease with heart failure: Secondary | ICD-10-CM | POA: Diagnosis present

## 2017-11-13 DIAGNOSIS — M109 Gout, unspecified: Secondary | ICD-10-CM | POA: Diagnosis present

## 2017-11-13 DIAGNOSIS — J9601 Acute respiratory failure with hypoxia: Secondary | ICD-10-CM

## 2017-11-13 DIAGNOSIS — K210 Gastro-esophageal reflux disease with esophagitis: Secondary | ICD-10-CM | POA: Diagnosis not present

## 2017-11-13 DIAGNOSIS — Z915 Personal history of self-harm: Secondary | ICD-10-CM | POA: Diagnosis not present

## 2017-11-13 DIAGNOSIS — Z590 Homelessness: Secondary | ICD-10-CM | POA: Diagnosis not present

## 2017-11-13 DIAGNOSIS — Z79899 Other long term (current) drug therapy: Secondary | ICD-10-CM | POA: Diagnosis not present

## 2017-11-13 DIAGNOSIS — R112 Nausea with vomiting, unspecified: Secondary | ICD-10-CM | POA: Diagnosis not present

## 2017-11-13 DIAGNOSIS — R1310 Dysphagia, unspecified: Secondary | ICD-10-CM | POA: Diagnosis present

## 2017-11-13 DIAGNOSIS — J9600 Acute respiratory failure, unspecified whether with hypoxia or hypercapnia: Secondary | ICD-10-CM | POA: Diagnosis present

## 2017-11-13 DIAGNOSIS — F1010 Alcohol abuse, uncomplicated: Secondary | ICD-10-CM | POA: Diagnosis present

## 2017-11-13 DIAGNOSIS — T364X5A Adverse effect of tetracyclines, initial encounter: Secondary | ICD-10-CM | POA: Diagnosis not present

## 2017-11-13 DIAGNOSIS — R739 Hyperglycemia, unspecified: Secondary | ICD-10-CM | POA: Diagnosis present

## 2017-11-13 LAB — CBC WITH DIFFERENTIAL/PLATELET
Abs Immature Granulocytes: 0.1 10*3/uL (ref 0.0–0.1)
Abs Immature Granulocytes: 0.1 10*3/uL (ref 0.0–0.1)
BASOS ABS: 0 10*3/uL (ref 0.0–0.1)
BASOS PCT: 1 %
Basophils Absolute: 0.1 10*3/uL (ref 0.0–0.1)
Basophils Relative: 1 %
EOS ABS: 0.1 10*3/uL (ref 0.0–0.7)
EOS PCT: 0 %
EOS PCT: 1 %
Eosinophils Absolute: 0 10*3/uL (ref 0.0–0.7)
HCT: 49.1 % (ref 39.0–52.0)
HEMATOCRIT: 50.5 % (ref 39.0–52.0)
HEMOGLOBIN: 15.9 g/dL (ref 13.0–17.0)
Hemoglobin: 15 g/dL (ref 13.0–17.0)
Immature Granulocytes: 1 %
Immature Granulocytes: 2 %
LYMPHS PCT: 43 %
Lymphocytes Relative: 9 %
Lymphs Abs: 0.4 10*3/uL — ABNORMAL LOW (ref 0.7–4.0)
Lymphs Abs: 2.9 10*3/uL (ref 0.7–4.0)
MCH: 31.1 pg (ref 26.0–34.0)
MCH: 31.4 pg (ref 26.0–34.0)
MCHC: 30.5 g/dL (ref 30.0–36.0)
MCHC: 31.5 g/dL (ref 30.0–36.0)
MCV: 101.9 fL — ABNORMAL HIGH (ref 78.0–100.0)
MCV: 99.6 fL (ref 78.0–100.0)
MONO ABS: 0 10*3/uL — AB (ref 0.1–1.0)
MONO ABS: 0.6 10*3/uL (ref 0.1–1.0)
Monocytes Relative: 1 %
Monocytes Relative: 8 %
NEUTROS ABS: 3.6 10*3/uL (ref 1.7–7.7)
Neutro Abs: 3.1 10*3/uL (ref 1.7–7.7)
Neutrophils Relative %: 46 %
Neutrophils Relative %: 89 %
PLATELETS: 185 10*3/uL (ref 150–400)
Platelets: 202 10*3/uL (ref 150–400)
RBC: 4.82 MIL/uL (ref 4.22–5.81)
RBC: 5.07 MIL/uL (ref 4.22–5.81)
RDW: 13.7 % (ref 11.5–15.5)
RDW: 13.8 % (ref 11.5–15.5)
WBC: 4.1 10*3/uL (ref 4.0–10.5)
WBC: 6.8 10*3/uL (ref 4.0–10.5)

## 2017-11-13 LAB — I-STAT CHEM 8, ED
BUN: 17 mg/dL (ref 6–20)
CREATININE: 1.2 mg/dL (ref 0.61–1.24)
Calcium, Ion: 1.07 mmol/L — ABNORMAL LOW (ref 1.15–1.40)
Chloride: 102 mmol/L (ref 98–111)
GLUCOSE: 137 mg/dL — AB (ref 70–99)
HCT: 49 % (ref 39.0–52.0)
HEMOGLOBIN: 16.7 g/dL (ref 13.0–17.0)
Potassium: 3.9 mmol/L (ref 3.5–5.1)
Sodium: 144 mmol/L (ref 135–145)
TCO2: 29 mmol/L (ref 22–32)

## 2017-11-13 LAB — COMPREHENSIVE METABOLIC PANEL
ALBUMIN: 3.6 g/dL (ref 3.5–5.0)
ALT: 57 U/L — ABNORMAL HIGH (ref 0–44)
ANION GAP: 20 — AB (ref 5–15)
AST: 40 U/L (ref 15–41)
Alkaline Phosphatase: 41 U/L (ref 38–126)
BILIRUBIN TOTAL: 0.7 mg/dL (ref 0.3–1.2)
BUN: 18 mg/dL (ref 6–20)
CHLORIDE: 101 mmol/L (ref 98–111)
CO2: 20 mmol/L — ABNORMAL LOW (ref 22–32)
Calcium: 8.6 mg/dL — ABNORMAL LOW (ref 8.9–10.3)
Creatinine, Ser: 0.99 mg/dL (ref 0.61–1.24)
GFR calc Af Amer: >60 mL/min (ref >60)
GFR calc non Af Amer: >60 mL/min (ref >60)
GLUCOSE: 332 mg/dL — AB (ref 70–99)
POTASSIUM: 4.7 mmol/L (ref 3.5–5.1)
SODIUM: 141 mmol/L (ref 135–145)
TOTAL PROTEIN: 6.1 g/dL — AB (ref 6.5–8.1)

## 2017-11-13 LAB — RAPID URINE DRUG SCREEN, HOSP PERFORMED
Amphetamines: NOT DETECTED
BARBITURATES: NOT DETECTED
BENZODIAZEPINES: POSITIVE — AB
Cocaine: POSITIVE — AB
Opiates: NOT DETECTED
Tetrahydrocannabinol: POSITIVE — AB

## 2017-11-13 LAB — I-STAT TROPONIN, ED: Troponin i, poc: 0.02 ng/mL (ref 0.00–0.08)

## 2017-11-13 LAB — TROPONIN I
Troponin I: <0.03 ng/mL (ref <0.03)
Troponin I: <0.03 ng/mL (ref <0.03)

## 2017-11-13 LAB — MAGNESIUM: Magnesium: 2.2 mg/dL (ref 1.7–2.4)

## 2017-11-13 MED ORDER — METHYLPREDNISOLONE SODIUM SUCC 40 MG IJ SOLR
40.0000 mg | Freq: Two times a day (BID) | INTRAMUSCULAR | Status: DC
  Administered 2017-11-13 – 2017-11-14 (×4): 40 mg via INTRAVENOUS
  Filled 2017-11-13 (×4): qty 1

## 2017-11-13 MED ORDER — ALBUTEROL SULFATE (2.5 MG/3ML) 0.083% IN NEBU: 2.5000 mg | INHALATION_SOLUTION | RESPIRATORY_TRACT | Status: DC | PRN

## 2017-11-13 MED ORDER — ENOXAPARIN SODIUM 40 MG/0.4ML ~~LOC~~ SOLN
40.0000 mg | Freq: Every day | SUBCUTANEOUS | Status: DC
  Administered 2017-11-13 – 2017-11-15 (×3): 40 mg via SUBCUTANEOUS
  Filled 2017-11-13 (×5): qty 0.4

## 2017-11-13 MED ORDER — FOLIC ACID 1 MG PO TABS
1.0000 mg | ORAL_TABLET | Freq: Every day | ORAL | Status: DC
  Administered 2017-11-13 – 2017-11-16 (×4): 1 mg via ORAL
  Filled 2017-11-13 (×4): qty 1

## 2017-11-13 MED ORDER — CEFTRIAXONE SODIUM 1 G IJ SOLR
1.0000 g | Freq: Once | INTRAMUSCULAR | Status: AC
  Administered 2017-11-13: 1 g via INTRAVENOUS
  Filled 2017-11-13: qty 10

## 2017-11-13 MED ORDER — ASPIRIN EC 81 MG PO TBEC
81.0000 mg | DELAYED_RELEASE_TABLET | Freq: Every day | ORAL | Status: DC
  Administered 2017-11-13 – 2017-11-16 (×4): 81 mg via ORAL
  Filled 2017-11-13 (×4): qty 1

## 2017-11-13 MED ORDER — MAGNESIUM SULFATE 2 GM/50ML IV SOLN
2.0000 g | Freq: Once | INTRAVENOUS | Status: AC
  Administered 2017-11-13: 2 g via INTRAVENOUS
  Filled 2017-11-13: qty 50

## 2017-11-13 MED ORDER — BUDESONIDE 0.25 MG/2ML IN SUSP
0.2500 mg | Freq: Two times a day (BID) | RESPIRATORY_TRACT | Status: DC
  Administered 2017-11-13 – 2017-11-16 (×6): 0.25 mg via RESPIRATORY_TRACT
  Filled 2017-11-13 (×8): qty 2

## 2017-11-13 MED ORDER — DOXYCYCLINE HYCLATE 100 MG PO TABS
100.0000 mg | ORAL_TABLET | Freq: Two times a day (BID) | ORAL | Status: DC
Start: 2017-11-13 — End: 2017-11-15
  Administered 2017-11-13 – 2017-11-14 (×4): 100 mg via ORAL
  Filled 2017-11-13 (×4): qty 1

## 2017-11-13 MED ORDER — ONDANSETRON HCL 4 MG PO TABS: 4.0000 mg | ORAL_TABLET | Freq: Four times a day (QID) | ORAL | Status: DC | PRN

## 2017-11-13 MED ORDER — ONDANSETRON HCL 4 MG/2ML IJ SOLN
4.0000 mg | Freq: Four times a day (QID) | INTRAMUSCULAR | Status: DC | PRN
  Administered 2017-11-14 – 2017-11-15 (×2): 4 mg via INTRAVENOUS
  Filled 2017-11-13 (×2): qty 2

## 2017-11-13 MED ORDER — IPRATROPIUM-ALBUTEROL 0.5-2.5 (3) MG/3ML IN SOLN
3.0000 mL | Freq: Three times a day (TID) | RESPIRATORY_TRACT | Status: DC
  Administered 2017-11-13 – 2017-11-14 (×2): 3 mL via RESPIRATORY_TRACT
  Filled 2017-11-13 (×2): qty 3

## 2017-11-13 MED ORDER — NITROGLYCERIN 0.4 MG SL SUBL: 0.4000 mg | SUBLINGUAL_TABLET | SUBLINGUAL | Status: DC | PRN

## 2017-11-13 MED ORDER — ACETAMINOPHEN 650 MG RE SUPP: 650.0000 mg | Freq: Four times a day (QID) | RECTAL | Status: DC | PRN

## 2017-11-13 MED ORDER — SODIUM CHLORIDE 0.9 % IV SOLN
500.0000 mg | Freq: Once | INTRAVENOUS | Status: AC
  Administered 2017-11-13: 500 mg via INTRAVENOUS
  Filled 2017-11-13: qty 500

## 2017-11-13 MED ORDER — CARVEDILOL 12.5 MG PO TABS: 6.2500 mg | ORAL_TABLET | Freq: Two times a day (BID) | ORAL | Status: DC

## 2017-11-13 MED ORDER — FUROSEMIDE 20 MG PO TABS
20.0000 mg | ORAL_TABLET | Freq: Every day | ORAL | Status: DC
  Administered 2017-11-13 – 2017-11-16 (×4): 20 mg via ORAL
  Filled 2017-11-13 (×4): qty 1

## 2017-11-13 MED ORDER — IPRATROPIUM BROMIDE 0.02 % IN SOLN
0.5000 mg | RESPIRATORY_TRACT | Status: DC
  Administered 2017-11-13 (×3): 0.5 mg via RESPIRATORY_TRACT
  Filled 2017-11-13 (×3): qty 2.5

## 2017-11-13 MED ORDER — LISINOPRIL 5 MG PO TABS
5.0000 mg | ORAL_TABLET | Freq: Every day | ORAL | Status: DC
  Administered 2017-11-13 – 2017-11-14 (×2): 5 mg via ORAL
  Filled 2017-11-13 (×2): qty 1

## 2017-11-13 MED ORDER — ACETAMINOPHEN 325 MG PO TABS
650.0000 mg | ORAL_TABLET | Freq: Four times a day (QID) | ORAL | Status: DC | PRN
  Administered 2017-11-13 – 2017-11-15 (×3): 650 mg via ORAL
  Filled 2017-11-13 (×3): qty 2

## 2017-11-13 MED ORDER — ALBUTEROL SULFATE (2.5 MG/3ML) 0.083% IN NEBU
2.5000 mg | INHALATION_SOLUTION | RESPIRATORY_TRACT | Status: DC
  Administered 2017-11-13 (×3): 2.5 mg via RESPIRATORY_TRACT
  Filled 2017-11-13 (×3): qty 3

## 2017-11-13 NOTE — Discharge Summary (Signed)
Patient left AMA few minutes after I had seen him on 10/28/17.  Last Note from that day below                                               Patient Demographics:    Brian [name], is a 62 y.o. male, DOB - Aug 07, 1955, UJW:[id number]  Admit date - 10/26/2017   Admitting Physician Tonye Royalty, DO  Outpatient Primary MD for the patient is Patient, [name]  LOS - 2     Chief Complaint  Patient presents with  . Chest Pain       Brief Narrative [name] a 62 y.o.malewith a known history of chronic systolic CHF, COPD, tobacco and EtOH use disorderpresents to the emergency department for evaluation of chest pain. Patient was in a usual state of health until two days ago when he reports the onset of cough productive of green sputum and associated with chest pain.   Subjective:   Patient in bed, appears comfortable, denies any headache, no fever, no chest pain or pressure, improved orthopnea and shortness of breath , no abdominal pain. No focal weakness.  He refuses to be compliant with telemetry.  Says he will leave if the leads are left on.   Assessment  & Plan :   1.  Acute on chronic hypoxic and hypercapnic respiratory failure due to COPD exacerbation + #2 below - extremely noncompliant with medications, still actively smoking, abusing alcohol and cocaine on a regular basis.  Counseled to quit all but he says he cannot stop doing that.  Initially required BiPAP, now much improved with IV steroids, azithromycin, nebulizer treatment and oxygen.  Currently off of BiPAP.  Continue soft diet.  Continue treatment for CHF as below.  2.  Chronic systolic CHF with EF 35% along with diffuse hypokinesis.  Again noncompliant with medications he says he does not take any, does have trace orthopnea hence could have mild acute exacerbation, BNP was borderline and CHF did show mild interstitial edema pattern, he is clinically much improved after 3 doses of IV Lasix, reduce  dose and continue, will avoid beta-blockers due to active cocaine abuse, have added low-dose ACE inhibitor along with Imdur and monitor.  He wanted to leave AMA if telemetry leads were left on, he refuses to wear them, will discontinue telemetry leads as long as he stays in the hospital in a monitored setting.  He understands the risks and benefits.  3.  Smoking, alcohol abuse, cocaine abuse.  Counseled to quit all.  Already seems to be going in DTs, placed on scheduled Librium along with CIWA protocol.  4.  Hypertension.  Placed on combination of Norvasc, Imdur, ACE inhibitor and as needed diuretic.  Blood pressure better continue to monitor.    Diet :       Diet Order           DIET SOFT Room service appropriate? Yes; Fluid consistency: Thin  Diet effective now           Family Communication  :  None  Code Status :  Full  Disposition Plan  :  Step down  Consults  :  None  Procedures  :    TTE  09/2017 - Mildly dilated LV with EF 30-35%, diffuse hypokinesis. Mildly dilated RV with normal systolic function. No significant valvular abnormalities. Mild pulmonary  hypertension.  DVT Prophylaxis  :  Lovenox    RecentLabs       Lab Results  Component Value Date   PLT 193 10/27/2017      Inpatient Medications  Scheduled Meds: . amLODipine  10 mg Oral Daily  . aspirin EC  81 mg Oral Daily  . [START ON 10/29/2017] azithromycin  500 mg Oral Daily  . chlordiazePOXIDE  15 mg Oral TID  . dextromethorphan-guaiFENesin  1 tablet Oral BID  . enoxaparin (LOVENOX) injection  40 mg Subcutaneous Daily  . folic acid  1 mg Oral Daily  . [START ON 10/29/2017] furosemide  40 mg Intravenous Daily  . insulin aspart  0-5 Units Subcutaneous QHS  . insulin aspart  0-9 Units Subcutaneous TID WC  . ipratropium-albuterol  3 mL Nebulization BID  . isosorbide mononitrate  30 mg Oral Daily  . lisinopril  5 mg Oral Daily  . methylPREDNISolone (SOLU-MEDROL) injection  60 mg  Intravenous Q8H  . multivitamin with minerals  1 tablet Oral Daily  . thiamine  100 mg Oral Daily   Continuous Infusions: PRN Meds:.acetaminophen **OR** acetaminophen, bisacodyl, hydrALAZINE, LORazepam, magnesium citrate, ondansetron **OR** ondansetron (ZOFRAN) IV, senna-docusate  Antibiotics  :               Anti-infectives (From admission, onward)   Start     Dose/Rate Route Frequency Ordered Stop   10/29/17 1000  azithromycin (ZITHROMAX) tablet 500 mg     500 mg Oral Daily 10/28/17 0755     10/26/17 2345  azithromycin (ZITHROMAX) 500 mg in sodium chloride 0.9 % 250 mL IVPB  Status:  Discontinued     500 mg 250 mL/hr over 60 Minutes Intravenous Every 24 hours 10/26/17 2338 10/28/17 0755         Objective:   Vitals:   10/27/17 2104 10/27/17 2300 10/28/17 0403 10/28/17 0746  BP: 118/80 120/75 134/86   Pulse: 96 77 73   Resp:  (!) 24 (!) 25   Temp:  98.5 F (36.9 C) 97.7 F (36.5 C)   TempSrc:  Oral Oral   SpO2:  100% 96% 98%  Weight:      Height:           Wt Readings from Last 3 Encounters:  10/27/17 83.9 kg (184 lb 15.5 oz)  10/16/17 82.7 kg (182 lb 6.4 oz)  10/01/17 81.4 kg (179 lb 7.3 oz)     Intake/Output Summary (Last 24 hours) at 10/28/2017 1011 Last data filed at 10/28/2017 0850    Gross per 24 hour  Intake 840 ml  Output 3400 ml  Net -2560 ml     Physical Exam  Awake Alert, Oriented X 2, No new F.N deficits, Normal affect [place].AT,PERRAL Supple Neck,No JVD, No cervical lymphadenopathy appriciated.  Symmetrical Chest wall movement, moderate air movement bilaterally, few wheezes and rails RRR,No Gallops, Rubs or new Murmurs, No Parasternal Heave +ve B.Sounds, Abd Soft, No tenderness, No organomegaly appriciated, No rebound - guarding or rigidity. No Cyanosis, Clubbing or edema, No new Rash or bruise   Data Review:    CBC LastLabs       Recent Labs  Lab 10/26/17 1805 10/27/17 0012 10/27/17 0301   WBC 5.3 4.7 4.6  HGB 15.3 15.7 15.5  HCT 49.3 51.2 51.1  PLT 206 190 193  MCV 100.2* 100.4* 100.8*  MCH 31.1 30.8 30.6  MCHC 31.0 30.7 30.3  RDW 14.6 14.4 14.5      Chemistries  LastLabs  Recent Labs  Lab 10/26/17 1805 10/27/17 0012 10/27/17 0301 10/28/17 0206  NA 147*  --  145 140  K 4.1  --  4.8 4.4  CL 109  --  109 98  CO2 29  --  25 28  GLUCOSE 101*  --  144* 312*  BUN 14  --  16 31*  CREATININE 1.00 0.83 0.78 1.07  CALCIUM 8.7*  --  8.6* 9.2  MG  --  2.1  --  1.9  AST  --   --  49*  --   ALT  --   --  57*  --   ALKPHOS  --   --  38  --   BILITOT  --   --  0.5  --      ------------------------------------------------------------------------------------------------------------------ RecentLabs(last2labs)  No results for input(s): CHOL, HDL, LDLCALC, TRIG, CHOLHDL, LDLDIRECT in the last 72 hours.    RecentLabs       Lab Results  Component Value Date   HGBA1C 5.1 08/27/2012     ------------------------------------------------------------------------------------------------------------------  RecentLabs(last2labs)  No results for input(s): TSH, T4TOTAL, T3FREE, THYROIDAB in the last 72 hours.  Invalid input(s): FREET3   ------------------------------------------------------------------------------------------------------------------ RecentLabs(last2labs)  No results for input(s): VITAMINB12, FOLATE, FERRITIN, TIBC, IRON, RETICCTPCT in the last 72 hours.    Coagulation profile LastLabs  No results for input(s): INR, PROTIME in the last 168 hours.    RecentLabs(last2labs)  No results for input(s): DDIMER in the last 72 hours.    Cardiac Enzymes  LastLabs  No results for input(s): CKMB, TROPONINI, MYOGLOBIN in the last 168 hours.  Invalid input(s): CK   ------------------------------------------------------------------------------------------------------------------ Labs(Brief)           Component Value Date/Time   BNP 258.0 (H) 10/26/2017 1920      Micro Results        Recent Results (from the past 240 hour(s))  MRSA PCR Screening     Status: None   Collection Time: 10/27/17  5:10 AM  Result Value Ref Range Status   MRSA by PCR NEGATIVE NEGATIVE Final    Comment:        The GeneXpert MRSA Assay (FDA approved for NASAL specimens only), is one component of a comprehensive MRSA colonization surveillance program. It is not intended to diagnose MRSA infection nor to guide or monitor treatment for MRSA infections. Performed at [facility], [address] [place], [place] [address]     Radiology Reports  ImagingResults  Dg Chest 2 View  Result Date: 10/26/2017 CLINICAL DATA:  Chest pain with shortness of breath. History of alcohol abuse. EXAM: CHEST - 2 VIEW COMPARISON:  10/14/2017. FINDINGS: Heart is enlarged. There is slight vascular congestion. No consolidation or edema. No effusion or pneumothorax. Thoracic atherosclerosis. No osseous findings. IMPRESSION: Cardiomegaly with mild vascular congestion. No consolidation or edema. Electronically Signed   By: Elsie Stain M.D.   On: 10/26/2017 18:56   Dg Chest 2 View  Result Date: 10/14/2017 CLINICAL DATA:  Mid chest pain.  Shortness of breath. EXAM: CHEST - 2 VIEW COMPARISON:  October 09, 2016 FINDINGS: Stable cardiomegaly. The hila and mediastinum are normal. No pneumothorax. No pulmonary nodules or masses. No focal infiltrates. IMPRESSION: No active cardiopulmonary disease. Electronically Signed   By: Gerome Sam III M.D   On: 10/14/2017 16:55   Dg Hand 2 View Right  Result Date: 10/16/2017 CLINICAL DATA:  Right fourth finger pain and swelling after injury finding 3 days ago. EXAM: RIGHT HAND - 2 VIEW  COMPARISON:  Radiographs of November 26, 2012. FINDINGS: No acute fracture is noted. Old healed proximal fifth metacarpal fracture is noted. 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 No other joint space abnormality is noted. No soft tissue abnormality is noted. IMPRESSION: Chronic abnormalities as described above. No acute abnormality seen in the right hand. Electronically Signed   By: Lupita Raider, M.D.   On: 10/16/2017 11:54   Dg Chest Portable 1 View  Result Date: 10/09/2017 CLINICAL DATA:  Left upper arm and left shoulder pain, some numbness in the left hand, shortness of breath, alcohol and drug abuse EXAM: PORTABLE CHEST 1 VIEW COMPARISON:  Portable chest x-ray of 09/27/2016 FINDINGS: Minimally prominent markings remain particular at the left lung base most consistent with atelectasis and possible scarring. No definite pneumonia or effusion is seen. Mediastinal and hilar contours are unremarkable and the heart is mildly enlarged and stable. No acute bony abnormality is seen. IMPRESSION: 1. No active process. 2. Mild left basilar linear atelectasis or scarring. 3. Stable mild cardiomegaly. Electronically Signed   By: Dwyane Dee M.D.   On: 10/09/2017 16:41     Time Spent in minutes  30   Susa Raring M.D on 10/28/2017 at 10:11 AM  To page go to [email or web address] - password [facility]

## 2017-11-13 NOTE — Progress Notes (Signed)
Patient is currently on 1LNC with sats of 94%. Patient is in no distress and is resting comfortably. BIPAP is not needed at this time. Will continue to monitor.

## 2017-11-13 NOTE — ED Notes (Signed)
Patient given turkey sandwich and PO fluids. Tolerating well.  

## 2017-11-13 NOTE — Progress Notes (Signed)
Brian [name] is a 62 y.o. male with history of polysubstance abuse, COPD, systolic heart failure who was just discharged a week ago after being admitted for COPD and CHF presents with complaint of 2 days of chest pain which is mostly in the left side anterior chest wall nonradiating present even at rest with increasing shortness of breath productive cough and wheezing. Patient was admitted for acute copd exacerbation.    Assessment:  1. Acute respiratory failure with hypoxia secondary to acute copd exacerbation 2. Hypertension.  3. Acute Bronchitis.  4. Hyperglycemia.  3. Polysubstance abuse.   Exam: Alert and on 4l it of [place] oxygen.  Wheezing bilaterally. Air entry fair.  Abdomen soft and non tender.  CVS s1s2, RRR.     Plan: 1. Duo nebs, doxycycline and IV steroids.  2. Get Hgba1c to check for DM.  3. Social work consult for home less ness.  4. Continue with anti hypertensive medications.     Kathlen Mody, MD [phone]

## 2017-11-13 NOTE — ED Notes (Addendum)
Patient O2 sats started to drop between 78-86.   MD notified.

## 2017-11-13 NOTE — H&P (Signed)
History and Physical    Brian [name]:[id number] DOB: 16-Sep-1955 DOA: 11/12/2017  PCP: [name], PA-C  Patient coming from: Homeless.  Chief Complaint: Shortness of breath and chest pain.  HPI: Brian [name] is a 62 y.o. male with history of polysubstance abuse, COPD, systolic heart failure who was just discharged a week ago after being admitted for COPD and CHF presents with complaint of 2 days of chest pain which is mostly in the left side anterior chest wall nonradiating present even at rest with increasing shortness of breath productive cough and wheezing.  Denies any fever or chills.  Patient states he has been making greenish color sputum.  ED Course: In the ER patient was hypoxic and had to be placed on BiPAP.  And was found to be diffusely wheezing for which patient was given nebulizer treatment antibiotics steroids.  Patient was eventually possible to be weaned off.  And patient admitted for COPD exacerbation.  Review of Systems: As per HPI, rest all negative.   Past Medical History:  Diagnosis Date  . Alcohol abuse    Heavy alcohol abuse and homelessness  . Arthritis   . Cardiomyopathy (HCC)   . CHF (congestive heart failure) (HCC)   . Cocaine abuse (HCC)    And crack   . COPD (chronic obstructive pulmonary disease) (HCC)   . Heart failure   . Homelessness   . Noncompliance   . Paroxysmal VT (HCC)    a. first seen 2013 in setting of normal LVEF 55-60%, EF now decreased.  . Suicide attempt (HCC)   . Tobacco abuse     Past Surgical History:  Procedure Laterality Date  . INNER EAR SURGERY       reports that he has been smoking cigarettes.  He has been smoking about 2.00 packs per day. He has never used smokeless tobacco. He reports that he drinks alcohol. He reports that he does not use drugs.  No Known Allergies  Family History  Problem Relation Age of Onset  . Coronary artery disease Unknown   . Diabetes Unknown   . Cancer Sister      Prior to Admission medications   Medication Sig Start Date End Date Taking? Authorizing Provider  albuterol (PROVENTIL HFA;VENTOLIN HFA) 108 (90 Base) MCG/ACT inhaler Inhale 2 puffs into the lungs every 6 (six) hours as needed for wheezing or shortness of breath. 11/08/17  Yes [name], PA-C  aspirin 81 MG EC tablet Take 1 tablet (81 mg total) by mouth daily. 11/08/17  Yes [name], PA-C  carvedilol (COREG) 6.25 MG tablet Take 1 tablet (6.25 mg total) by mouth 2 (two) times daily with a meal. 11/08/17 11/03/18 Yes [name], PA-C  folic acid (FOLVITE) 1 MG tablet Take 1 tablet (1 mg total) by mouth daily. 11/08/17 05/07/18 Yes [name], PA-C  furosemide (LASIX) 20 MG tablet Take 1 tablet (20 mg total) by mouth daily. 11/08/17  Yes [name], PA-C  lisinopril (PRINIVIL,ZESTRIL) 5 MG tablet Take 1 tablet (5 mg total) by mouth daily. 11/08/17  Yes [name], PA-C  mometasone-formoterol [facility]) 200-5 MCG/ACT AERO Inhale 2 puffs into the lungs 2 (two) times daily. 11/08/17  Yes [name], PA-C  nicotine (NICODERM CQ - DOSED IN MG/24 HOURS) 21 mg/24hr patch Place 1 patch (21 mg total) onto the skin daily. 11/08/17  Yes [name], PA-C  nitroGLYCERIN (NITROSTAT) 0.4 MG SL tablet Place 1 tablet (0.4  mg total) under the tongue every 5 (five) minutes as needed for chest pain. 11/08/17  Yes [name], PA-C    Physical Exam: Vitals:   11/13/17 0300 11/13/17 0330 11/13/17 0336 11/13/17 0345  BP: (!) 103/58 114/72  123/72  Pulse: 66 74 100 73  Resp: (!) 26 (!) 23 (!) 28 (!) 25  Temp:      TempSrc:      SpO2: 94% 97% 95% 95%  Weight:      Height:          Constitutional: Moderately built and nourished. Vitals:   11/13/17 0300 11/13/17 0330 11/13/17 0336 11/13/17 0345  BP: (!) 103/58 114/72  123/72  Pulse: 66 74 100 73  Resp: (!) 26 (!) 23 (!) 28 (!) 25  Temp:      TempSrc:      SpO2: 94% 97% 95% 95%  Weight:      Height:        Eyes: Anicteric no pallor ENMT: No discharge from the ears eyes nose or mouth. Neck: No mass palpated no neck rigidity. Respiratory: Mild expiratory wheeze no crepitations. Cardiovascular: S1-S2 heard no murmurs appreciated. Abdomen: Soft nontender bowel sounds present.  No guarding or rigidity. Musculoskeletal: No edema.  No joint effusion. Skin: No rash. Neurologic: Alert awake oriented to time place and person.  Moves all extremities. Psychiatric: Appears normal per normal affect.   Labs on Admission: I have personally reviewed following labs and imaging studies  CBC: Recent Labs  Lab 11/06/17 0501 11/12/17 2359 11/13/17 0010  WBC 10.4 6.8  --   NEUTROABS 7.4 3.1  --   HGB 15.9 15.9 16.7  HCT 49.6 50.5 49.0  MCV 97.6 99.6  --   PLT 176 202  --    Basic Metabolic Panel: Recent Labs  Lab 11/06/17 0501 11/08/17 1122 11/12/17 2359 11/13/17 0010  NA 139 143  --  144  K 3.9 5.0  --  3.9  CL 100 98  --  102  CO2 34* 32*  --   --   GLUCOSE 96 97  --  137*  BUN 23* 18  --  17  CREATININE 0.82 0.88  --  1.20  CALCIUM 9.3 9.7  --   --   MG  --   --  2.2  --    GFR: Estimated Creatinine Clearance: 69.2 mL/min (by C-G formula based on SCr of 1.2 mg/dL). Liver Function Tests: No results for input(s): AST, ALT, ALKPHOS, BILITOT, PROT, ALBUMIN in the last 168 hours. No results for input(s): LIPASE, AMYLASE in the last 168 hours. No results for input(s): AMMONIA in the last 168 hours. Coagulation Profile: No results for input(s): INR, PROTIME in the last 168 hours. Cardiac Enzymes: No results for input(s): CKTOTAL, CKMB, CKMBINDEX, TROPONINI in the last 168 hours. BNP (last 3 results) No results for input(s): PROBNP in the last 8760 hours. HbA1C: No results for input(s): HGBA1C in the last 72 hours. CBG: No results for input(s): GLUCAP in the last 168 hours. Lipid Profile: No results for input(s): CHOL, HDL, LDLCALC, TRIG, CHOLHDL, LDLDIRECT in the last 72  hours. Thyroid Function Tests: No results for input(s): TSH, T4TOTAL, FREET4, T3FREE, THYROIDAB in the last 72 hours. Anemia Panel: No results for input(s): VITAMINB12, FOLATE, FERRITIN, TIBC, IRON, RETICCTPCT in the last 72 hours. Urine analysis:    Component Value Date/Time   COLORURINE STRAW (A) 11/03/2017 1324   APPEARANCEUR CLEAR 11/03/2017 1324   APPEARANCEUR Clear 10/10/2012 0320  LABSPEC 1.003 (L) 11/03/2017 1324   LABSPEC 1.009 10/10/2012 0320   PHURINE 5.0 11/03/2017 1324   GLUCOSEU NEGATIVE 11/03/2017 1324   GLUCOSEU Negative 10/10/2012 0320   HGBUR NEGATIVE 11/03/2017 1324   BILIRUBINUR NEGATIVE 11/03/2017 1324   BILIRUBINUR Negative 10/10/2012 0320   KETONESUR NEGATIVE 11/03/2017 1324   PROTEINUR NEGATIVE 11/03/2017 1324   UROBILINOGEN 0.2 08/27/2012 0659   NITRITE NEGATIVE 11/03/2017 1324   LEUKOCYTESUR NEGATIVE 11/03/2017 1324   LEUKOCYTESUR Negative 10/10/2012 0320   Sepsis Labs: @LABRCNTIP (procalcitonin:4,lacticidven:4) ) Recent Results (from the past 240 hour(s))  Urine culture     Status: Abnormal   Collection Time: 11/03/17  1:24 PM  Result Value Ref Range Status   Specimen Description URINE, RANDOM  Final   Special Requests   Final    NONE Performed at [facility], [address] [place], [place] [address]    Culture MULTIPLE SPECIES PRESENT, SUGGEST RECOLLECTION (A)  Final   Report Status 11/04/2017 FINAL  Final  Respiratory Panel by PCR     Status: None   Collection Time: 11/03/17  6:36 PM  Result Value Ref Range Status   Adenovirus NOT DETECTED NOT DETECTED Final   Coronavirus 229E NOT DETECTED NOT DETECTED Final   Coronavirus HKU1 NOT DETECTED NOT DETECTED Final   Coronavirus NL63 NOT DETECTED NOT DETECTED Final   Coronavirus OC43 NOT DETECTED NOT DETECTED Final   Metapneumovirus NOT DETECTED NOT DETECTED Final   Rhinovirus / Enterovirus NOT DETECTED NOT DETECTED Final   Influenza A NOT DETECTED NOT DETECTED Final   Influenza B NOT  DETECTED NOT DETECTED Final   Parainfluenza Virus 1 NOT DETECTED NOT DETECTED Final   Parainfluenza Virus 2 NOT DETECTED NOT DETECTED Final   Parainfluenza Virus 3 NOT DETECTED NOT DETECTED Final   Parainfluenza Virus 4 NOT DETECTED NOT DETECTED Final   Respiratory Syncytial Virus NOT DETECTED NOT DETECTED Final   Bordetella pertussis NOT DETECTED NOT DETECTED Final   Chlamydophila pneumoniae NOT DETECTED NOT DETECTED Final   Mycoplasma pneumoniae NOT DETECTED NOT DETECTED Final    Comment: Performed at [facility], [address], [place], [place] [address]     Radiological Exams on Admission: Dg Chest Portable 1 View  Result Date: 11/13/2017 CLINICAL DATA:  62 y/o  M; shortness of breath. EXAM: PORTABLE CHEST 1 VIEW COMPARISON:  11/03/2017 chest radiograph FINDINGS: Stable normal cardiac silhouette. Aortic atherosclerosis with calcification. Clear lungs. No pleural effusion or pneumothorax. No acute osseous abnormality is evident. IMPRESSION: No active disease. Electronically Signed   By: Mitzi Hansen M.D.   On: 11/13/2017 00:10    EKG: Independently reviewed.  Sinus tachycardia.  Assessment/Plan Principal Problem:   Acute respiratory failure with hypoxia (HCC) Active Problems:   Polysubstance (excluding opioids) dependence, daily use (HCC)   COPD with acute exacerbation (HCC)   Chronic systolic CHF (congestive heart failure) (HCC)    1. Acute respiratory failure with hypoxia likely from COPD exacerbation but no obvious evidence of any fluid overload.  Continue with nebulizer treatment Pulmicort antibiotics steroids.  Patient was just weaned off the BiPAP. 2. Chest pain -we will cycle cardiac markers.  Check urine drug screen.  On aspirin. 3. Chronic systolic heart failure last EF measured was in September 27 1928 to 35%.  On Lasix which will be continued orally.  Does not appear to be in fluid overload. 4. Polysubstance abuse advised to quit the habit.  If patient's  cocaine is positive we will hold Coreg.  DVT prophylaxis: Lovenox. Code Status: Full code. Family Communication: Discussed with patient. Disposition Plan: To be determined. Consults called: None. Admission status: Inpatient.   Eduard Clos MD [facility] Pager [phone].  If 7PM-7AM, please contact night-coverage [email or web address] Password [facility]  11/13/2017, 4:39 AM

## 2017-11-13 NOTE — ED Notes (Signed)
Pt given meal tray.

## 2017-11-13 NOTE — ED Provider Notes (Signed)
[facility] EMERGENCY DEPARTMENT Provider Note   CSN: [id number] Arrival date & time: 11/12/17  2330     History   Chief Complaint Chief Complaint  Patient presents with  . Chest Pain    HPI Brian [name] is a 62 y.o. male.  The history is provided by the EMS personnel and the patient. The history is limited by the condition of the patient.  Chest Pain   This is a recurrent problem. The current episode started 12 to 24 hours ago. The problem occurs constantly. The problem has not changed since onset.The pain is associated with rest. The pain is present in the substernal region. The pain is moderate. The quality of the pain is described as dull. The pain does not radiate. Associated symptoms include shortness of breath. Pertinent negatives include no abdominal pain, no diaphoresis, no fever, no nausea, no near-syncope and no vomiting. He has tried nothing for the symptoms. Risk factors include male gender.  Pertinent negatives for past medical history include no aneurysm.  Pertinent negatives for family medical history include: no aortic dissection.  Procedure history is negative for cardiac catheterization.  Wheezing   This is a recurrent problem. The current episode started 12 to 24 hours ago. The problem occurs constantly. Associated symptoms include chest pain. Pertinent negatives include no fever, no abdominal pain, no vomiting and no diarrhea. The problem's precipitants include smoke. He has tried nothing for the symptoms. He has had prior hospitalizations. He has had prior ED visits. His past medical history is significant for COPD.    Past Medical History:  Diagnosis Date  . Alcohol abuse    Heavy alcohol abuse and homelessness  . Arthritis   . Cardiomyopathy (HCC)   . CHF (congestive heart failure) (HCC)   . Cocaine abuse (HCC)    And crack   . COPD (chronic obstructive pulmonary disease) (HCC)   . Heart failure   . Homelessness   . Noncompliance    . Paroxysmal VT (HCC)    a. first seen 2013 in setting of normal LVEF 55-60%, EF now decreased.  . Suicide attempt (HCC)   . Tobacco abuse     Patient Active Problem List   Diagnosis Date Noted  . Acute respiratory failure with hypoxia (HCC) 11/13/2017  . Goals of care, counseling/discussion   . Palliative care by specialist   . Acute on chronic systolic CHF (congestive heart failure), NYHA class 4 (HCC) 11/03/2017  . Hyperglycemia 11/03/2017  . Acute respiratory failure with hypoxia and hypercapnia (HCC) 10/26/2017  . Acute on chronic respiratory failure with hypoxia (HCC) 09/27/2017  . Tobacco dependence 09/27/2017  . Chronic systolic CHF (congestive heart failure) (HCC) 09/25/2017  . COPD with acute exacerbation (HCC) 09/30/2015  . Substance induced mood disorder (HCC) 02/04/2013  . Polysubstance (excluding opioids) dependence, daily use (HCC) 02/03/2013  . Alcohol dependence (HCC) 02/03/2013  . Gout attack 11/12/2012  . Closed fracture of 5th metacarpal 11/12/2012  . Chest pain 08/27/2012  . Acute exacerbation of chronic obstructive pulmonary disease (COPD) (HCC)   . Homelessness   . Cocaine abuse [facility])     Past Surgical History:  Procedure Laterality Date  . INNER EAR SURGERY          Home Medications    Prior to Admission medications   Medication Sig Start Date End Date Taking? Authorizing Provider  albuterol (PROVENTIL HFA;VENTOLIN HFA) 108 (90 Base) MCG/ACT inhaler Inhale 2 puffs into the lungs every 6 (six) hours as  needed for wheezing or shortness of breath. 11/08/17   [name], PA-C  aspirin 81 MG EC tablet Take 1 tablet (81 mg total) by mouth daily. 11/08/17   [name], PA-C  carvedilol (COREG) 6.25 MG tablet Take 1 tablet (6.25 mg total) by mouth 2 (two) times daily with a meal. 11/08/17 11/03/18  [name], PA-C  folic acid (FOLVITE) 1 MG tablet Take 1 tablet (1 mg total) by mouth daily. 11/08/17 05/07/18  [name], PA-C    furosemide (LASIX) 20 MG tablet Take 1 tablet (20 mg total) by mouth daily. 11/08/17   [name], PA-C  lisinopril (PRINIVIL,ZESTRIL) 5 MG tablet Take 1 tablet (5 mg total) by mouth daily. 11/08/17   [name], PA-C  mometasone-formoterol (DULERA) 200-5 MCG/ACT AERO Inhale 2 puffs into the lungs 2 (two) times daily. 11/08/17   [name], PA-C  nicotine (NICODERM CQ - DOSED IN MG/24 HOURS) 21 mg/24hr patch Place 1 patch (21 mg total) onto the skin daily. 11/08/17   [name], PA-C  nitroGLYCERIN (NITROSTAT) 0.4 MG SL tablet Place 1 tablet (0.4 mg total) under the tongue every 5 (five) minutes as needed for chest pain. 11/08/17   [name], PA-C    Family History Family History  Problem Relation Age of Onset  . Coronary artery disease Unknown   . Diabetes Unknown   . Cancer Sister     Social History Social History   Tobacco Use  . Smoking status: Current Every Day Smoker    Packs/day: 2.00    Types: Cigarettes  . Smokeless tobacco: Never Used  Substance Use Topics  . Alcohol use: Yes    Comment: Heavy. 5-10 40oz daily  . Drug use: No    Types: Marijuana, Cocaine    Comment: Marijuana and cocaine, crack     Allergies   Patient has no known allergies.   Review of Systems Review of Systems  Constitutional: Negative for diaphoresis and fever.  Respiratory: Positive for shortness of breath and wheezing.   Cardiovascular: Positive for chest pain. Negative for near-syncope.  Gastrointestinal: Negative for abdominal pain, diarrhea, nausea and vomiting.  All other systems reviewed and are negative.    Physical Exam Updated Vital Signs BP 103/61   Pulse 85   Temp 98.3 F (36.8 C) (Oral)   Resp (!) 28   Ht 5\' 6"  (1.676 m)   Wt 88.9 kg (196 lb)   SpO2 95%   BMI 31.64 kg/m   Physical Exam  Constitutional: He is oriented to person, place, and time. He appears well-developed and well-nourished. No distress.  HENT:  Head: Normocephalic  and atraumatic.  Mouth/Throat: No oropharyngeal exudate.  Eyes: Pupils are equal, round, and reactive to light. Conjunctivae are normal.  Neck: Normal range of motion. Neck supple.  Cardiovascular: Normal rate, regular rhythm, normal heart sounds and intact distal pulses.  Pulmonary/Chest: He is in respiratory distress. He has wheezes.  Abdominal: Soft. Bowel sounds are normal. He exhibits no mass. There is no tenderness. There is no rebound and no guarding.  Musculoskeletal: Normal range of motion.  Neurological: He is alert and oriented to person, place, and time.  Skin: Skin is warm and dry. Capillary refill takes less than 2 seconds.  Psychiatric: He has a normal mood and affect.  Nursing note and vitals reviewed.    ED Treatments / Results  Labs (all labs ordered are listed, but only abnormal results are displayed) Results  for orders placed or performed during the hospital encounter of 11/12/17  CBC with Differential/Platelet  Result Value Ref Range   WBC 6.8 4.0 - 10.5 K/uL   RBC 5.07 4.22 - 5.81 MIL/uL   Hemoglobin 15.9 13.0 - 17.0 g/dL   HCT 84.1 32.4 - 40.1 %   MCV 99.6 78.0 - 100.0 fL   MCH 31.4 26.0 - 34.0 pg   MCHC 31.5 30.0 - 36.0 g/dL   RDW 02.7 25.3 - 66.4 %   Platelets 202 150 - 400 K/uL   Neutrophils Relative % 46 %   Neutro Abs 3.1 1.7 - 7.7 K/uL   Lymphocytes Relative 43 %   Lymphs Abs 2.9 0.7 - 4.0 K/uL   Monocytes Relative 8 %   Monocytes Absolute 0.6 0.1 - 1.0 K/uL   Eosinophils Relative 1 %   Eosinophils Absolute 0.1 0.0 - 0.7 K/uL   Basophils Relative 1 %   Basophils Absolute 0.1 0.0 - 0.1 K/uL   Immature Granulocytes 1 %   Abs Immature Granulocytes 0.1 0.0 - 0.1 K/uL  Magnesium  Result Value Ref Range   Magnesium 2.2 1.7 - 2.4 mg/dL  I-stat troponin, ED  Result Value Ref Range   Troponin i, poc 0.02 0.00 - 0.08 ng/mL   Comment 3          I-Stat Chem 8, ED  Result Value Ref Range   Sodium 144 135 - 145 mmol/L   Potassium 3.9 3.5 - 5.1 mmol/L    Chloride 102 98 - 111 mmol/L   BUN 17 6 - 20 mg/dL   Creatinine, Ser 4.03 0.61 - 1.24 mg/dL   Glucose, Bld 474 (H) 70 - 99 mg/dL   Calcium, Ion 2.59 (L) 1.15 - 1.40 mmol/L   TCO2 29 22 - 32 mmol/L   Hemoglobin 16.7 13.0 - 17.0 g/dL   HCT 56.3 87.5 - 64.3 %   Dg Chest 2 View  Result Date: 11/03/2017 CLINICAL DATA:  Chest pain and shortness of breath for several hours EXAM: CHEST - 2 VIEW COMPARISON:  10/26/2017 FINDINGS: The heart size and mediastinal contours are within normal limits. Both lungs are clear. The visualized skeletal structures are unremarkable. IMPRESSION: No active cardiopulmonary disease. Electronically Signed   By: Alcide Clever M.D.   On: 11/03/2017 13:53   Dg Chest 2 View  Result Date: 10/26/2017 CLINICAL DATA:  Chest pain with shortness of breath. History of alcohol abuse. EXAM: CHEST - 2 VIEW COMPARISON:  10/14/2017. FINDINGS: Heart is enlarged. There is slight vascular congestion. No consolidation or edema. No effusion or pneumothorax. Thoracic atherosclerosis. No osseous findings. IMPRESSION: Cardiomegaly with mild vascular congestion. No consolidation or edema. Electronically Signed   By: Elsie Stain M.D.   On: 10/26/2017 18:56   Dg Chest 2 View  Result Date: 10/14/2017 CLINICAL DATA:  Mid chest pain.  Shortness of breath. EXAM: CHEST - 2 VIEW COMPARISON:  October 09, 2016 FINDINGS: Stable cardiomegaly. The hila and mediastinum are normal. No pneumothorax. No pulmonary nodules or masses. No focal infiltrates. IMPRESSION: No active cardiopulmonary disease. Electronically Signed   By: Gerome Sam III M.D   On: 10/14/2017 16:55   Dg Hand 2 View Right  Result Date: 10/16/2017 CLINICAL DATA:  Right fourth finger pain and swelling after injury finding 3 days ago. EXAM: RIGHT HAND - 2 VIEW COMPARISON:  Radiographs of November 26, 2012. FINDINGS: No acute fracture is noted. Old healed proximal fifth metacarpal fracture is noted. There is again noted chronic  posterior  dislocation of the fourth middle phalanx relative to fourth proximal phalanx with bony remodeling of the distal portion of fourth proximal phalanx which is unchanged compared to prior exam. No other joint space abnormality is noted. No soft tissue abnormality is noted. IMPRESSION: Chronic abnormalities as described above. No acute abnormality seen in the right hand. Electronically Signed   By: Lupita Raider, M.D.   On: 10/16/2017 11:54   Dg Chest Portable 1 View  Result Date: 11/13/2017 CLINICAL DATA:  62 y/o  M; shortness of breath. EXAM: PORTABLE CHEST 1 VIEW COMPARISON:  11/03/2017 chest radiograph FINDINGS: Stable normal cardiac silhouette. Aortic atherosclerosis with calcification. Clear lungs. No pleural effusion or pneumothorax. No acute osseous abnormality is evident. IMPRESSION: No active disease. Electronically Signed   By: Mitzi Hansen M.D.   On: 11/13/2017 00:10    EKG  EKG Interpretation  Date/Time:  Monday November 12 2017 23:36:44 EDT Ventricular Rate:  103 PR Interval:    QRS Duration: 93 QT Interval:  346 QTC Calculation: 453 R Axis:   56 Text Interpretation:  Sinus tachycardia Borderline T abnormalities, lateral leads Confirmed by Nicanor Alcon, Annaleise Burger ([id number]) on 11/13/2017 3:24:32 AM       Radiology Dg Chest Portable 1 View  Result Date: 11/13/2017 CLINICAL DATA:  62 y/o  M; shortness of breath. EXAM: PORTABLE CHEST 1 VIEW COMPARISON:  11/03/2017 chest radiograph FINDINGS: Stable normal cardiac silhouette. Aortic atherosclerosis with calcification. Clear lungs. No pleural effusion or pneumothorax. No acute osseous abnormality is evident. IMPRESSION: No active disease. Electronically Signed   By: Mitzi Hansen M.D.   On: 11/13/2017 00:10    Procedures Procedures (including critical care time)  Medications Ordered in ED Medications  cefTRIAXone (ROCEPHIN) 1 g in sodium chloride 0.9 % 100 mL IVPB (has no administration in time range)  azithromycin  (ZITHROMAX) 500 mg in sodium chloride 0.9 % 250 mL IVPB (has no administration in time range)  magnesium sulfate IVPB 2 g 50 mL (has no administration in time range)  albuterol (PROVENTIL,VENTOLIN) solution continuous neb (10 mg/hr Nebulization Given 11/12/17 2346)  ketorolac (TORADOL) 30 MG/ML injection 30 mg (30 mg Intravenous Given 11/13/17 0101)   MDM Reviewed: previous chart, nursing note and vitals Interpretation: ECG, labs and x-ray (No PNA negative troponin) Total time providing critical care: 75-105 minutes (bipap initiated by me). This excludes time spent performing separately reportable procedures and services. Consults: admitting MD  CRITICAL CARE Performed by: Jasmine Awe Total critical care time: 90 minutes Critical care time was exclusive of separately billable procedures and treating other patients. Critical care was necessary to treat or prevent imminent or life-threatening deterioration. Critical care was time spent personally by me on the following activities: development of treatment plan with patient and/or surrogate as well as nursing, discussions with consultants, evaluation of patient's response to treatment, examination of patient, obtaining history from patient or surrogate, ordering and performing treatments and interventions, ordering and review of laboratory studies, ordering and review of radiographic studies, pulse oximetry and re-evaluation of patient's condition.  The patient appears reasonably stabilized for admission considering the current resources, flow, and capabilities available in the ED at this time, and I doubt any other [facility] requiring further screening and/or treatment in the ED prior to admission. Final Clinical Impressions(s) / ED Diagnoses   Admit for COPD exacerbation on BIPAP   Donique Hammonds, MD 11/13/17 6045

## 2017-11-13 NOTE — ED Notes (Signed)
Patient given water and crackers. Tolerating well.

## 2017-11-14 DIAGNOSIS — F1920 Other psychoactive substance dependence, uncomplicated: Secondary | ICD-10-CM

## 2017-11-14 MED ORDER — HYDRALAZINE HCL 20 MG/ML IJ SOLN
5.0000 mg | Freq: Four times a day (QID) | INTRAMUSCULAR | Status: DC | PRN
  Administered 2017-11-14: 5 mg via INTRAVENOUS
  Filled 2017-11-14: qty 1

## 2017-11-14 MED ORDER — IPRATROPIUM-ALBUTEROL 0.5-2.5 (3) MG/3ML IN SOLN
3.0000 mL | Freq: Two times a day (BID) | RESPIRATORY_TRACT | Status: DC
Start: 2017-11-14 — End: 2017-11-16
  Administered 2017-11-15 – 2017-11-16 (×3): 3 mL via RESPIRATORY_TRACT
  Filled 2017-11-14 (×4): qty 3

## 2017-11-14 MED ORDER — NICOTINE 14 MG/24HR TD PT24
14.0000 mg | MEDICATED_PATCH | Freq: Every day | TRANSDERMAL | Status: DC
  Administered 2017-11-14 – 2017-11-16 (×3): 14 mg via TRANSDERMAL
  Filled 2017-11-14 (×3): qty 1

## 2017-11-14 MED ORDER — LISINOPRIL 10 MG PO TABS
10.0000 mg | ORAL_TABLET | Freq: Every day | ORAL | Status: DC
  Administered 2017-11-15 – 2017-11-16 (×2): 10 mg via ORAL
  Filled 2017-11-14 (×2): qty 1

## 2017-11-14 MED ORDER — ZOLPIDEM TARTRATE 5 MG PO TABS
5.0000 mg | ORAL_TABLET | Freq: Every evening | ORAL | Status: DC | PRN
  Administered 2017-11-14: 5 mg via ORAL
  Filled 2017-11-14: qty 1

## 2017-11-14 MED ORDER — ALUM & MAG HYDROXIDE-SIMETH 200-200-20 MG/5ML PO SUSP
30.0000 mL | Freq: Four times a day (QID) | ORAL | Status: DC | PRN
  Administered 2017-11-14: 30 mL via ORAL
  Filled 2017-11-14: qty 30

## 2017-11-14 NOTE — Plan of Care (Signed)
Discussed the plan of care with the patient.  Encouraged patient to use call button when assistance is needed.  Also discussed pain management.  Minimal teach back displayed.

## 2017-11-14 NOTE — Care Management Note (Addendum)
Case Management Note  Patient Details  Name: Brian [name] MRN: [id number] Date of Birth: 30-Jul-1955  Subjective/Objective:  Homeless, will need medication ast and a bus pass, presents with acute resp failure with hypoxia secondary to copd ex, htn, acute bronchitis, hyperglycemia, psa, Has a follow up apt at the [facility] already scheduled for 9/3 at 11:10.  He has used the Match letter already this year so he is not eligible for the Match, will need to go to [facility] clinic to get medications.                 8/9 Letha Cape RN, BSN- patient is for dc today, called the [facility] clinic to see if patient can use the one time free fill, they said he used it already, and his medications will be either 4.00 or 10.00 each,  Patient states his niece will help him to purchase his medications at the [facility] clinic.  CSW bringing patient a bus pass.   Action/Plan: DC when medically ready.   Expected Discharge Date:  11/15/17               Expected Discharge Plan:  Homeless Shelter  In-House Referral:  Clinical Social Work  Discharge planning Services  CM Consult, [facility], Follow-up appt scheduled, Medication Assistance  Post Acute Care Choice:    Choice offered to:     DME Arranged:    DME Agency:     HH Arranged:    HH Agency:     Status of Service:  In process, will continue to follow  If discussed at Long Length of Stay Meetings, dates discussed:    Additional Comments:  Leone Haven, RN 11/14/2017, 10:08 AM

## 2017-11-14 NOTE — Progress Notes (Signed)
PROGRESS NOTE    Brian [name]  CZY:[id number] DOB: 1955/11/10 DOA: 11/12/2017 PCP: [name], PA-C  Brief Narrative:61 y.o.malewithhistory of polysubstance abuse, COPD, systolic heart failure who was just discharged a week ago after being admitted for COPD and CHF presents with complaint of 2 days of chest pain which is mostly in the left side anterior chest wall nonradiating present even at rest with increasing shortness of breath productive cough and wheezing. Patient was admitted for acute copd exacerbation.   Assessment & Plan:   Principal Problem:   Acute respiratory failure with hypoxia (HCC) -due to COPD exacerbation, no evidence of fluid overload clinically -Weaned off BiPAP, continue IV steroids, nebs and antibiotics -wean down 02, wheezing improving, breathing not back to baseline yet  Pleuritic chest pain -Due to COPD exacerbation, resolved, troponin negative -UDS positive for cocaine which also could be contributing  Chronic systolic CHF -EF 0/93 was 35% -Clinically euvolemic, continue by mouth Lasix  Polysubstance abuse -Including cocaine, cannabis -Counseled  Homelessness -Limited resources and access to healthcare, contributing to frequent hospitalizations and admissions -Case management consult   DVT prophylaxis:Lovenox Code Status: full code Family Communication:no family at bedside Disposition Plan: inpatient  Consultants:    Procedures:   Antimicrobials:    Subjective: -continuous report cough and shortness of breath which is improving since yesterday -Admits to limited access to healthcare and medications as able to get his medicines from recent hospitalization  Objective: Vitals:   11/14/17 0025 11/14/17 0500 11/14/17 0801 11/14/17 0803  BP: 125/78   (!) 149/90  Pulse: 82   75  Resp: 19   17  Temp: 97.9 F (36.6 C)     TempSrc: Oral     SpO2: 98%  91% 98%  Weight:  85.7 kg (188 lb 15 oz)    Height:         Intake/Output Summary (Last 24 hours) at 11/14/2017 1138 Last data filed at 11/14/2017 1057 Gross per 24 hour  Intake 720 ml  Output -  Net 720 ml   Filed Weights   11/12/17 2334 11/14/17 0500  Weight: 88.9 kg (196 lb) 85.7 kg (188 lb 15 oz)    Examination:  General exam: chronically illdisheveled debilitated male sitting up in bed, no distress Respiratory system: poor Airway movement, scattered expiratory wheezes. Cardiovascular system: S1 & S2 heard, RRR  Gastrointestinal system: Abdomen is nondistended, soft and nontender.Normal bowel sounds heard. Central nervous system: Alert and oriented. No focal neurological deficits. Extremities: Symmetric 5 x 5 power. Skin: No rashes, lesions or ulcers Psychiatry: Judgement and insight appear normal. Mood & affect appropriate.     Data Reviewed:   CBC: Recent Labs  Lab 11/12/17 2359 11/13/17 0010 11/13/17 0552  WBC 6.8  --  4.1  NEUTROABS 3.1  --  3.6  HGB 15.9 16.7 15.0  HCT 50.5 49.0 49.1  MCV 99.6  --  101.9*  PLT 202  --  185   Basic Metabolic Panel: Recent Labs  Lab 11/08/17 1122 11/12/17 2359 11/13/17 0010 11/13/17 0552  NA 143  --  144 141  K 5.0  --  3.9 4.7  CL 98  --  102 101  CO2 32*  --   --  20*  GLUCOSE 97  --  137* 332*  BUN 18  --  17 18  CREATININE 0.88  --  1.20 0.99  CALCIUM 9.7  --   --  8.6*  MG  --  2.2  --   --  GFR: Estimated Creatinine Clearance: 82.5 mL/min (by C-G formula based on SCr of 0.99 mg/dL). Liver Function Tests: Recent Labs  Lab 11/13/17 0552  AST 40  ALT 57*  ALKPHOS 41  BILITOT 0.7  PROT 6.1*  ALBUMIN 3.6   No results for input(s): LIPASE, AMYLASE in the last 168 hours. No results for input(s): AMMONIA in the last 168 hours. Coagulation Profile: No results for input(s): INR, PROTIME in the last 168 hours. Cardiac Enzymes: Recent Labs  Lab 11/13/17 0552 11/13/17 1043 11/13/17 1613  TROPONINI <0.03 <0.03 <0.03   BNP (last 3 results) No results for  input(s): PROBNP in the last 8760 hours. HbA1C: No results for input(s): HGBA1C in the last 72 hours. CBG: No results for input(s): GLUCAP in the last 168 hours. Lipid Profile: No results for input(s): CHOL, HDL, LDLCALC, TRIG, CHOLHDL, LDLDIRECT in the last 72 hours. Thyroid Function Tests: No results for input(s): TSH, T4TOTAL, FREET4, T3FREE, THYROIDAB in the last 72 hours. Anemia Panel: No results for input(s): VITAMINB12, FOLATE, FERRITIN, TIBC, IRON, RETICCTPCT in the last 72 hours. Urine analysis:    Component Value Date/Time   COLORURINE STRAW (A) 11/03/2017 1324   APPEARANCEUR CLEAR 11/03/2017 1324   APPEARANCEUR Clear 10/10/2012 0320   LABSPEC 1.003 (L) 11/03/2017 1324   LABSPEC 1.009 10/10/2012 0320   PHURINE 5.0 11/03/2017 1324   GLUCOSEU NEGATIVE 11/03/2017 1324   GLUCOSEU Negative 10/10/2012 0320   HGBUR NEGATIVE 11/03/2017 1324   BILIRUBINUR NEGATIVE 11/03/2017 1324   BILIRUBINUR Negative 10/10/2012 0320   KETONESUR NEGATIVE 11/03/2017 1324   PROTEINUR NEGATIVE 11/03/2017 1324   UROBILINOGEN 0.2 08/27/2012 0659   NITRITE NEGATIVE 11/03/2017 1324   LEUKOCYTESUR NEGATIVE 11/03/2017 1324   LEUKOCYTESUR Negative 10/10/2012 0320   Sepsis Labs: @LABRCNTIP (procalcitonin:4,lacticidven:4)  )No results found for this or any previous visit (from the past 240 hour(s)).       Radiology Studies: Dg Chest Portable 1 View  Result Date: 11/13/2017 CLINICAL DATA:  62 y/o  M; shortness of breath. EXAM: PORTABLE CHEST 1 VIEW COMPARISON:  11/03/2017 chest radiograph FINDINGS: Stable normal cardiac silhouette. Aortic atherosclerosis with calcification. Clear lungs. No pleural effusion or pneumothorax. No acute osseous abnormality is evident. IMPRESSION: No active disease. Electronically Signed   By: Mitzi Hansen M.D.   On: 11/13/2017 00:10        Scheduled Meds: . aspirin EC  81 mg Oral Daily  . budesonide (PULMICORT) nebulizer solution  0.25 mg Nebulization  BID  . doxycycline  100 mg Oral Q12H  . enoxaparin (LOVENOX) injection  40 mg Subcutaneous Daily  . folic acid  1 mg Oral Daily  . furosemide  20 mg Oral Daily  . ipratropium-albuterol  3 mL Nebulization BID  . lisinopril  5 mg Oral Daily  . methylPREDNISolone (SOLU-MEDROL) injection  40 mg Intravenous Q12H  . nicotine  14 mg Transdermal Daily   Continuous Infusions:   LOS: 1 day    Time spent:    Zannie Cove, MD [facility] Page via [email or web address], password [email or web address] After 7PM please contact night-coverage  11/14/2017, 11:38 AM

## 2017-11-14 NOTE — Evaluation (Signed)
Physical Therapy Evaluation Patient Details Name: Brian [name] MRN: [id number] DOB: April 01, 1956 Today's Date: 11/14/2017   History of Present Illness  62 y.o. male with history of polysubstance abuse, COPD, systolic heart failure who was just discharged a week ago after being admitted for COPD and CHF presents with complaint of 2 days of chest pain   Clinical Impression  Orders received for PT evaluation. Patient demonstrates modest deficits in functional mobility as indicated below but overall mobilizing independently at this time. Saturations stable on room air with values >94% and HR mid 70s throughout. Patient indicates that he is comfortable and independent with his activity and at his mobility baseline therefore no further acute PT needs at this time. Will sign off.      Follow Up Recommendations No PT follow up    Equipment Recommendations  None recommended by PT    Recommendations for Other Services       Precautions / Restrictions Precautions Precautions: Fall Restrictions Weight Bearing Restrictions: No      Mobility  Bed Mobility Overal bed mobility: Independent                Transfers Overall transfer level: Independent   Transfers: Sit to/from Stand Sit to Stand: Independent            Ambulation/Gait Ambulation/Gait assistance: Independent Gait Distance (Feet): 210 Feet Assistive device: None          Stairs            Wheelchair Mobility    Modified Rankin (Stroke Patients Only)       Balance Overall balance assessment: Needs assistance Sitting-balance support: No upper extremity supported Sitting balance-Leahy Scale: Good       Standing balance-Leahy Scale: Fair                               Pertinent Vitals/Pain Pain Assessment: Faces Faces Pain Scale: Hurts little more Pain Location: 4 Pain Descriptors / Indicators: Aching Pain Intervention(s): Monitored during session    Home Living  Family/patient expects to be discharged to:: Shelter/Homeless                      Prior Function Level of Independence: Independent               Hand Dominance   Dominant Hand: Right    Extremity/Trunk Assessment        Lower Extremity Assessment Lower Extremity Assessment: Overall WFL for tasks assessed    Cervical / Trunk Assessment Cervical / Trunk Assessment: Normal  Communication   Communication: No difficulties  Cognition Arousal/Alertness: Awake/alert Behavior During Therapy: Anxious Overall Cognitive Status: History of cognitive impairments - at baseline                                        General Comments      Exercises     Assessment/Plan    PT Assessment Patent does not need any further PT services  PT Problem List         PT Treatment Interventions      PT Goals (Current goals can be found in the Care Plan section)  Acute Rehab PT Goals Patient Stated Goal: get clean, maybe stay with his niece PT Goal Formulation: All assessment and education complete, DC therapy  Frequency     Barriers to discharge        Co-evaluation               AM-PAC PT "6 Clicks" Daily Activity  Outcome Measure Difficulty turning over in bed (including adjusting bedclothes, sheets and blankets)?: None Difficulty moving from lying on back to sitting on the side of the bed? : None Difficulty sitting down on and standing up from a chair with arms (e.g., wheelchair, bedside commode, etc,.)?: None Help needed moving to and from a bed to chair (including a wheelchair)?: None Help needed walking in hospital room?: A Little Help needed climbing 3-5 steps with a railing? : A Lot 6 Click Score: 21    End of Session Equipment Utilized During Treatment: Gait belt Activity Tolerance: Patient tolerated treatment well Patient left: in bed;with bed alarm set;with call bell/phone within reach Nurse Communication: Mobility status PT  Visit Diagnosis: Difficulty in walking, not elsewhere classified (R26.2)    Time: 6295-2841 PT Time Calculation (min) (ACUTE ONLY): 20 min   Charges:   PT Evaluation $PT Eval Low Complexity: 1 Low          Brian [name], PT DPT  Board Certified Neurologic Specialist [phone]   Brian [name] 11/14/2017, 5:21 PM

## 2017-11-15 ENCOUNTER — Encounter: Payer: Self-pay | Admitting: *Deleted

## 2017-11-15 MED ORDER — PANTOPRAZOLE SODIUM 40 MG PO TBEC
40.0000 mg | DELAYED_RELEASE_TABLET | Freq: Every day | ORAL | Status: DC
  Administered 2017-11-15: 40 mg via ORAL
  Filled 2017-11-15: qty 1

## 2017-11-15 MED ORDER — GI COCKTAIL ~~LOC~~
30.0000 mL | Freq: Three times a day (TID) | ORAL | Status: DC | PRN
  Administered 2017-11-15 (×2): 30 mL via ORAL
  Filled 2017-11-15 (×2): qty 30

## 2017-11-15 MED ORDER — PREDNISONE 20 MG PO TABS
40.0000 mg | ORAL_TABLET | Freq: Every day | ORAL | Status: DC
  Administered 2017-11-16: 40 mg via ORAL
  Filled 2017-11-15: qty 2

## 2017-11-15 MED ORDER — TRAZODONE HCL 100 MG PO TABS
100.0000 mg | ORAL_TABLET | Freq: Every day | ORAL | Status: DC
  Administered 2017-11-15: 100 mg via ORAL
  Filled 2017-11-15: qty 1

## 2017-11-15 MED ORDER — CEFDINIR 300 MG PO CAPS
300.0000 mg | ORAL_CAPSULE | Freq: Two times a day (BID) | ORAL | Status: DC
  Administered 2017-11-15 (×2): 300 mg via ORAL
  Filled 2017-11-15 (×3): qty 1

## 2017-11-15 NOTE — Progress Notes (Signed)
PROGRESS NOTE    Brian [name]  WRU:[id number] DOB: 07/28/1955 DOA: 11/12/2017 PCP: [name], PA-C  Brief Narrative:62 y.o.malewithhistory of polysubstance abuse, COPD, systolic heart failure who was just discharged a week ago after being admitted for COPD and CHF presents with complaint of 2 days of chest pain which is mostly in the left side anterior chest wall nonradiating present even at rest with increasing shortness of breath productive cough and wheezing. Patient was admitted for acute copd exacerbation.   Assessment & Plan:   Principal Problem:   Acute respiratory failure with hypoxia (HCC) -due to COPD exacerbation, no evidence of fluid overload clinically -Weaned off BiPAP, cut down IV steroids, continue nebs -stop doxycycline due to severe GERD/esophagitis -transitioned to prednisone taper -wean down 02, wheezing improving, breathing not back to baseline yet  Nausea/vomiting/severe dysphagia -Suspect secondary to GERD/esophagitis due to IV steroids and doxycycline -We'll cutdown steroids, add PPI, continue Maalox when necessary, stop doxycycline  Pleuritic chest pain -Due to COPD exacerbation, resolved, troponin negative -UDS positive for cocaine which also could be contributing  Chronic systolic CHF -EF 9/14 was 35% -Clinically euvolemic, continue by mouth Lasix  Polysubstance abuse -Including cocaine, cannabis -Counseled  Homelessness -Limited resources and access to healthcare, contributing to frequent hospitalizations and admissions -Case management consult   DVT prophylaxis:Lovenox Code Status: full code Family Communication:no family at bedside Disposition Plan:  Home pending improvement in nausea and vomiting  Consultants:    Procedures:   Antimicrobials:    Subjective: -complains of severe nausea, multiple episodes of vomiting since yesterday with dysphagia and Heartburn  Objective: Vitals:   11/14/17 1949 11/14/17 2300  11/15/17 0500 11/15/17 0715  BP: (!) 173/113 132/82  (!) 164/108  Pulse:  77  76  Resp:  20  18  Temp:  98.4 F (36.9 C)  98.2 F (36.8 C)  TempSrc:  Oral  Oral  SpO2:  92%  98%  Weight:   85.5 kg   Height:        Intake/Output Summary (Last 24 hours) at 11/15/2017 1314 Last data filed at 11/15/2017 7829 Gross per 24 hour  Intake 580 ml  Output -  Net 580 ml   Filed Weights   11/12/17 2334 11/14/17 0500 11/15/17 0500  Weight: 88.9 kg 85.7 kg 85.5 kg    Examination:  Gen: Awake, Alert, Oriented X 3, chronically ill disheveled male sitting up in bed, uncomfortable appearing HEENT: PERRLA, Neck supple, no JVD Lungs: poor air movement, no expiratory wheezes noted CVS: RRR,No Gallops,Rubs or new Murmurs Abd: soft, Non tender, non distended, BS present Extremities: No Cyanosis, Clubbing or edema Skin: no new rashes Psychiatry: Judgement and insight appear normal. Mood & affect appropriate.     Data Reviewed:   CBC: Recent Labs  Lab 11/12/17 2359 11/13/17 0010 11/13/17 0552  WBC 6.8  --  4.1  NEUTROABS 3.1  --  3.6  HGB 15.9 16.7 15.0  HCT 50.5 49.0 49.1  MCV 99.6  --  101.9*  PLT 202  --  185   Basic Metabolic Panel: Recent Labs  Lab 11/12/17 2359 11/13/17 0010 11/13/17 0552  NA  --  144 141  K  --  3.9 4.7  CL  --  102 101  CO2  --   --  20*  GLUCOSE  --  137* 332*  BUN  --  17 18  CREATININE  --  1.20 0.99  CALCIUM  --   --  8.6*  MG 2.2  --   --  GFR: Estimated Creatinine Clearance: 82.4 mL/min (by C-G formula based on SCr of 0.99 mg/dL). Liver Function Tests: Recent Labs  Lab 11/13/17 0552  AST 40  ALT 57*  ALKPHOS 41  BILITOT 0.7  PROT 6.1*  ALBUMIN 3.6   No results for input(s): LIPASE, AMYLASE in the last 168 hours. No results for input(s): AMMONIA in the last 168 hours. Coagulation Profile: No results for input(s): INR, PROTIME in the last 168 hours. Cardiac Enzymes: Recent Labs  Lab 11/13/17 0552 11/13/17 1043 11/13/17 1613    TROPONINI <0.03 <0.03 <0.03   BNP (last 3 results) No results for input(s): PROBNP in the last 8760 hours. HbA1C: No results for input(s): HGBA1C in the last 72 hours. CBG: No results for input(s): GLUCAP in the last 168 hours. Lipid Profile: No results for input(s): CHOL, HDL, LDLCALC, TRIG, CHOLHDL, LDLDIRECT in the last 72 hours. Thyroid Function Tests: No results for input(s): TSH, T4TOTAL, FREET4, T3FREE, THYROIDAB in the last 72 hours. Anemia Panel: No results for input(s): VITAMINB12, FOLATE, FERRITIN, TIBC, IRON, RETICCTPCT in the last 72 hours. Urine analysis:    Component Value Date/Time   COLORURINE STRAW (A) 11/03/2017 1324   APPEARANCEUR CLEAR 11/03/2017 1324   APPEARANCEUR Clear 10/10/2012 0320   LABSPEC 1.003 (L) 11/03/2017 1324   LABSPEC 1.009 10/10/2012 0320   PHURINE 5.0 11/03/2017 1324   GLUCOSEU NEGATIVE 11/03/2017 1324   GLUCOSEU Negative 10/10/2012 0320   HGBUR NEGATIVE 11/03/2017 1324   BILIRUBINUR NEGATIVE 11/03/2017 1324   BILIRUBINUR Negative 10/10/2012 0320   KETONESUR NEGATIVE 11/03/2017 1324   PROTEINUR NEGATIVE 11/03/2017 1324   UROBILINOGEN 0.2 08/27/2012 0659   NITRITE NEGATIVE 11/03/2017 1324   LEUKOCYTESUR NEGATIVE 11/03/2017 1324   LEUKOCYTESUR Negative 10/10/2012 0320   Sepsis Labs: @LABRCNTIP (procalcitonin:4,lacticidven:4)  )No results found for this or any previous visit (from the past 240 hour(s)).       Radiology Studies: No results found.      Scheduled Meds: . aspirin EC  81 mg Oral Daily  . budesonide (PULMICORT) nebulizer solution  0.25 mg Nebulization BID  . cefdinir  300 mg Oral Q12H  . enoxaparin (LOVENOX) injection  40 mg Subcutaneous Daily  . folic acid  1 mg Oral Daily  . furosemide  20 mg Oral Daily  . ipratropium-albuterol  3 mL Nebulization BID  . lisinopril  10 mg Oral Daily  . nicotine  14 mg Transdermal Daily  . pantoprazole  40 mg Oral Q1200  . [START ON 11/16/2017] predniSONE  40 mg Oral Q  breakfast  . traZODone  100 mg Oral QHS   Continuous Infusions:   LOS: 2 days    Time spent:    Zannie Cove, MD [facility] Page via [email or web address], password [email or web address] After 7PM please contact night-coverage  11/15/2017, 1:14 PM

## 2017-11-15 NOTE — Progress Notes (Signed)
Pt resting comfortably, no distress.  No BIPAP needed at this time.

## 2017-11-16 MED ORDER — PREDNISONE 20 MG PO TABS: 20.0000 mg | ORAL_TABLET | Freq: Every day | ORAL | 0 refills | Status: DC

## 2017-11-16 MED ORDER — CEFDINIR 300 MG PO CAPS: 300.0000 mg | ORAL_CAPSULE | Freq: Two times a day (BID) | ORAL | 0 refills | Status: AC

## 2017-11-16 MED ORDER — TRAZODONE HCL 50 MG PO TABS: 50.0000 mg | ORAL_TABLET | Freq: Every evening | ORAL | 0 refills | Status: DC | PRN

## 2017-11-16 MED ORDER — OMEPRAZOLE 20 MG PO TBEC: 20.0000 mg | DELAYED_RELEASE_TABLET | Freq: Every day | ORAL | 0 refills | Status: DC

## 2017-11-26 ENCOUNTER — Emergency Department (HOSPITAL_COMMUNITY)
Admission: EM | Admit: 2017-11-26 | Discharge: 2017-11-27 | Disposition: A | Discharge status: Home / Self Care | Payer: Medicaid Other | Attending: Emergency Medicine | Admitting: Emergency Medicine

## 2017-11-26 ENCOUNTER — Emergency Department (HOSPITAL_COMMUNITY): Payer: Medicaid Other

## 2017-11-26 DIAGNOSIS — F10920 Alcohol use, unspecified with intoxication, uncomplicated: Secondary | ICD-10-CM | POA: Diagnosis not present

## 2017-11-26 DIAGNOSIS — Z79899 Other long term (current) drug therapy: Secondary | ICD-10-CM | POA: Diagnosis not present

## 2017-11-26 DIAGNOSIS — I5022 Chronic systolic (congestive) heart failure: Secondary | ICD-10-CM | POA: Diagnosis not present

## 2017-11-26 DIAGNOSIS — R0602 Shortness of breath: Secondary | ICD-10-CM | POA: Diagnosis present

## 2017-11-26 DIAGNOSIS — J441 Chronic obstructive pulmonary disease with (acute) exacerbation: Secondary | ICD-10-CM | POA: Diagnosis not present

## 2017-11-26 DIAGNOSIS — F17210 Nicotine dependence, cigarettes, uncomplicated: Secondary | ICD-10-CM | POA: Insufficient documentation

## 2017-11-26 DIAGNOSIS — Z590 Homelessness: Secondary | ICD-10-CM | POA: Diagnosis not present

## 2017-11-26 DIAGNOSIS — Z7982 Long term (current) use of aspirin: Secondary | ICD-10-CM | POA: Insufficient documentation

## 2017-11-26 DIAGNOSIS — Y906 Blood alcohol level of 120-199 mg/100 ml: Secondary | ICD-10-CM | POA: Diagnosis not present

## 2017-11-26 MED ORDER — DEXAMETHASONE SODIUM PHOSPHATE 10 MG/ML IJ SOLN
10.0000 mg | Freq: Once | INTRAMUSCULAR | Status: AC
  Administered 2017-11-27: 10 mg via INTRAVENOUS
  Filled 2017-11-26: qty 1

## 2017-11-26 MED ORDER — IPRATROPIUM-ALBUTEROL 0.5-2.5 (3) MG/3ML IN SOLN
3.0000 mL | Freq: Once | RESPIRATORY_TRACT | Status: AC
  Administered 2017-11-27: 3 mL via RESPIRATORY_TRACT
  Filled 2017-11-26: qty 3

## 2017-11-26 NOTE — ED Notes (Signed)
Bed: WA06 Expected date:  Expected time:  Means of arrival:  Comments: Short of breath 

## 2017-11-26 NOTE — ED Triage Notes (Signed)
Pt BIB [facility] from [employer]. Pt is homeless. PT has been experiencing SOB all day and has gotten worse as the day has progressed. 1 Duoneb, 125 solumedrol. Sat low 90s initially on RA. Wheezing inspiratory and expiratory in all fields. No CP or cardiac symptoms, N/V, no fever. Hx COPD, emphysema, CHF

## 2017-11-26 NOTE — ED Notes (Signed)
PT was sating around 88 on RA while talking. Placed on [place] 2 L and came up to 92%.

## 2017-11-27 LAB — BASIC METABOLIC PANEL
Anion gap: 9 (ref 5–15)
BUN: 19 mg/dL (ref 6–20)
CHLORIDE: 108 mmol/L (ref 98–111)
CO2: 30 mmol/L (ref 22–32)
CREATININE: 0.97 mg/dL (ref 0.61–1.24)
Calcium: 9.2 mg/dL (ref 8.9–10.3)
GFR calc Af Amer: >60 mL/min (ref >60)
GFR calc non Af Amer: >60 mL/min (ref >60)
Glucose, Bld: 121 mg/dL — ABNORMAL HIGH (ref 70–99)
Potassium: 5.4 mmol/L — ABNORMAL HIGH (ref 3.5–5.1)
Sodium: 147 mmol/L — ABNORMAL HIGH (ref 135–145)

## 2017-11-27 LAB — CBC WITH DIFFERENTIAL/PLATELET
Basophils Absolute: 0 10*3/uL (ref 0.0–0.1)
Basophils Relative: 0 %
EOS ABS: 0 10*3/uL (ref 0.0–0.7)
Eosinophils Relative: 1 %
HCT: 48.6 % (ref 39.0–52.0)
HEMOGLOBIN: 15.9 g/dL (ref 13.0–17.0)
Lymphocytes Relative: 14 %
Lymphs Abs: 1.1 10*3/uL (ref 0.7–4.0)
MCH: 32.4 pg (ref 26.0–34.0)
MCHC: 32.7 g/dL (ref 30.0–36.0)
MCV: 99 fL (ref 78.0–100.0)
MONOS PCT: 2 %
Monocytes Absolute: 0.2 10*3/uL (ref 0.1–1.0)
NEUTROS PCT: 83 %
Neutro Abs: 6.6 10*3/uL (ref 1.7–7.7)
Platelets: 199 10*3/uL (ref 150–400)
RBC: 4.91 MIL/uL (ref 4.22–5.81)
RDW: 14 % (ref 11.5–15.5)
WBC: 8 10*3/uL (ref 4.0–10.5)

## 2017-11-27 LAB — RAPID URINE DRUG SCREEN, HOSP PERFORMED
AMPHETAMINES: NOT DETECTED
BENZODIAZEPINES: NOT DETECTED
Barbiturates: NOT DETECTED
Cocaine: NOT DETECTED
Tetrahydrocannabinol: POSITIVE — AB

## 2017-11-27 LAB — ETHANOL: Alcohol, Ethyl (B): 158 mg/dL — ABNORMAL HIGH (ref <10)

## 2017-11-27 LAB — I-STAT TROPONIN, ED: Troponin i, poc: 0.02 ng/mL (ref 0.00–0.08)

## 2017-11-27 MED ORDER — ALBUTEROL SULFATE HFA 108 (90 BASE) MCG/ACT IN AERS
2.0000 | INHALATION_SPRAY | Freq: Once | RESPIRATORY_TRACT | Status: AC
  Administered 2017-11-27: 2 via RESPIRATORY_TRACT
  Filled 2017-11-27: qty 6.7

## 2017-11-27 NOTE — ED Provider Notes (Signed)
[facility]   CSN: [id number] Arrival date & time: 11/26/17  2218     History   Chief Complaint Chief Complaint  Patient presents with  . Shortness of Breath    HPI Brian [name] is a 62 y.o. male.  HPI  This is a 62 year old male with a history of congestive heart failure, cocaine abuse, cardiomyopathy, COPD who presents with shortness of breath.  Patient reports 2-day history of worsening shortness of breath.  He reports cough and chills.  Reports that he is compliant with his heart failure medications but he is currently homeless and lives in a tent.  He has not noted any lower extremity swelling.  He endorses anterior chest pain that is nonradiating.  Currently it is 2 out of 10.  Mostly he is concerned regarding his shortness of breath.  He does not normally wear oxygen but is requiring 2 L to maintain sats greater than 88%.  O2 sats on 2 L 92%.  He has not noted any abdominal pain, nausea, vomiting.  Past Medical History:  Diagnosis Date  . Alcohol abuse    Heavy alcohol abuse and homelessness  . Arthritis   . Cardiomyopathy (HCC)   . CHF (congestive heart failure) (HCC)   . Cocaine abuse (HCC)    And crack   . COPD (chronic obstructive pulmonary disease) (HCC)   . Heart failure   . Homelessness   . Noncompliance   . Paroxysmal VT (HCC)    a. first seen 2013 in setting of normal LVEF 55-60%, EF now decreased.  . Suicide attempt (HCC)   . Tobacco abuse     Patient Active Problem List   Diagnosis Date Noted  . Acute respiratory failure with hypoxia (HCC) 11/13/2017  . Goals of care, counseling/discussion   . Palliative care by specialist   . Acute on chronic systolic CHF (congestive heart failure), NYHA class 4 (HCC) 11/03/2017  . Hyperglycemia 11/03/2017  . Acute respiratory failure with hypoxia and hypercapnia (HCC) 10/26/2017  . Acute on chronic respiratory failure with hypoxia (HCC) 09/27/2017  . Tobacco  dependence 09/27/2017  . Chronic systolic CHF (congestive heart failure) (HCC) 09/25/2017  . COPD with acute exacerbation (HCC) 09/30/2015  . Substance induced mood disorder (HCC) 02/04/2013  . Polysubstance (excluding opioids) dependence, daily use (HCC) 02/03/2013  . Alcohol dependence (HCC) 02/03/2013  . Gout attack 11/12/2012  . Closed fracture of 5th metacarpal 11/12/2012  . Chest pain 08/27/2012  . Acute exacerbation of chronic obstructive pulmonary disease (COPD) (HCC)   . Homelessness   . Cocaine abuse [facility])     Past Surgical History:  Procedure Laterality Date  . INNER EAR SURGERY          Home Medications    Prior to Admission medications   Medication Sig Start Date End Date Taking? Authorizing Provider  albuterol (PROVENTIL HFA;VENTOLIN HFA) 108 (90 Base) MCG/ACT inhaler Inhale 2 puffs into the lungs every 6 (six) hours as needed for wheezing or shortness of breath. 11/08/17  Yes [name], PA-C  carvedilol (COREG) 6.25 MG tablet Take 1 tablet (6.25 mg total) by mouth 2 (two) times daily with a meal. 11/08/17 11/03/18 Yes [name], PA-C  folic acid (FOLVITE) 1 MG tablet Take 1 tablet (1 mg total) by mouth daily. 11/08/17 05/07/18 Yes [name], PA-C  furosemide (LASIX) 20 MG tablet Take 1 tablet (20 mg total) by mouth daily. 11/08/17  Yes [name], PA-C  mometasone-formoterol (DULERA) 200-5 MCG/ACT AERO Inhale 2 puffs into the lungs 2 (two) times daily. 11/08/17  Yes [name], PA-C  nicotine (NICODERM CQ - DOSED IN MG/24 HOURS) 21 mg/24hr patch Place 1 patch (21 mg total) onto the skin daily. 11/08/17  Yes [name], PA-C  nitroGLYCERIN (NITROSTAT) 0.4 MG SL tablet Place 1 tablet (0.4 mg total) under the tongue every 5 (five) minutes as needed for chest pain. 11/08/17  Yes [name], PA-C  aspirin 81 MG EC tablet Take 1 tablet (81 mg total) by mouth daily. 11/08/17   [name], PA-C  lisinopril (PRINIVIL,ZESTRIL) 5  MG tablet Take 1 tablet (5 mg total) by mouth daily. 11/08/17   [name], PA-C  Omeprazole 20 MG TBEC Take 1 tablet (20 mg total) by mouth daily. 11/16/17   Zannie Cove, MD  predniSONE (DELTASONE) 20 MG tablet Take 1-2 tablets (20-40 mg total) by mouth daily with breakfast. Take 40mg  for 2days then 20mg  for 2days then STOP 11/16/17   Zannie Cove, MD  traZODone (DESYREL) 50 MG tablet Take 1 tablet (50 mg total) by mouth at bedtime as needed for sleep. 11/16/17   Zannie Cove, MD    Family History Family History  Problem Relation Age of Onset  . Coronary artery disease Unknown   . Diabetes Unknown   . Cancer Sister     Social History Social History   Tobacco Use  . Smoking status: Current Every Day Smoker    Packs/day: 2.00    Types: Cigarettes  . Smokeless tobacco: Never Used  Substance Use Topics  . Alcohol use: Yes    Comment: Heavy. 5-10 40oz daily  . Drug use: No    Types: Marijuana, Cocaine    Comment: Marijuana and cocaine, crack     Allergies   Patient has no known allergies.   Review of Systems Review of Systems  Constitutional: Positive for chills. Negative for fever.  Respiratory: Positive for cough, chest tightness and shortness of breath.   Cardiovascular: Negative for chest pain and leg swelling.  Gastrointestinal: Negative for abdominal pain, nausea and vomiting.  Genitourinary: Negative for dysuria.  Neurological: Negative for headaches.  All other systems reviewed and are negative.    Physical Exam Updated Vital Signs BP (!) 99/59   Pulse 65   Temp (!) 97.5 F (36.4 C) (Oral)   Resp (!) 26   SpO2 94%   Physical Exam  Constitutional: He is oriented to person, place, and time.  Chronically ill-appearing, disheveled, obese  HENT:  Head: Normocephalic and atraumatic.  Neck: Neck supple.  Cardiovascular: Normal rate, regular rhythm and normal heart sounds.  No murmur heard. Pulmonary/Chest: Tachypnea noted. No respiratory  distress. He has decreased breath sounds. He has wheezes.  Mild tachypnea, diminished breath sounds left upper and lower lobes, minimal breath sounds right lower lobe, occasional wheeze  Abdominal: Soft. Bowel sounds are normal. There is no tenderness. There is no rebound.  Musculoskeletal: He exhibits no edema.  Trace bilateral lower extremity edema  Lymphadenopathy:    He has no cervical adenopathy.  Neurological: He is alert and oriented to person, place, and time.  Skin: Skin is warm and dry.  Psychiatric: He has a normal mood and affect.  Nursing note and vitals reviewed.    ED Treatments / Results  Labs (all labs ordered are listed, but only abnormal results are displayed) Labs Reviewed  BASIC METABOLIC PANEL - Abnormal; Notable for the following components:  Result Value   Sodium 147 (*)    Potassium 5.4 (*)    Glucose, Bld 121 (*)    All other components within normal limits  ETHANOL - Abnormal; Notable for the following components:   Alcohol, Ethyl (B) 158 (*)    All other components within normal limits  RAPID URINE DRUG SCREEN, HOSP PERFORMED - Abnormal; Notable for the following components:   Opiates   (*)    Value: Result not available. Reagent lot number recalled by manufacturer.   Tetrahydrocannabinol POSITIVE (*)    All other components within normal limits  CBC WITH DIFFERENTIAL/PLATELET  I-STAT TROPONIN, ED    EKG EKG Interpretation  Date/Time:  Monday November 26 2017 23:44:15 EDT Ventricular Rate:  68 PR Interval:    QRS Duration: 94 QT Interval:  418 QTC Calculation: 445 R Axis:   79 Text Interpretation:  Sinus rhythm Left atrial enlargement Borderline T wave abnormalities No significant change since last tracing Confirmed by Ross Marcus ([id number]) on 11/27/2017 12:05:42 AM   Radiology Dg Chest 2 View  Result Date: 11/26/2017 CLINICAL DATA:  Short of breath EXAM: CHEST - 2 VIEW COMPARISON:  11/12/2017, 11/03/2017 FINDINGS: Subsegmental  atelectasis at the bases. Mild cardiomegaly. No pleural effusion. Vascularity within normal limits. No pneumothorax. IMPRESSION: Cardiomegaly.  Subsegmental atelectasis at both bases Electronically Signed   By: Jasmine Pang M.D.   On: 11/26/2017 23:15    Procedures Procedures (including critical care time)  Medications Ordered in ED Medications  albuterol (PROVENTIL HFA;VENTOLIN HFA) 108 (90 Base) MCG/ACT inhaler 2 puff (has no administration in time range)  ipratropium-albuterol (DUONEB) 0.5-2.5 (3) MG/3ML nebulizer solution 3 mL (3 mLs Nebulization Given 11/27/17 0009)  dexamethasone (DECADRON) injection 10 mg (10 mg Intravenous Given 11/27/17 0008)     Initial Impression / Assessment and Plan / ED Course  I have reviewed the triage vital signs and the nursing notes.  Pertinent labs & imaging results that were available during my care of the patient were reviewed by me and considered in my medical decision making (see chart for details).  Clinical Course as of Nov 28 603  Tue Nov 27, 2017  0027 Was called to the patient's room as he was irate.  He wanting to leave.  He was upset with nursing as his urinal "busted".  He was calling people names.  I discussed with him that we are happy to help him and I have ordered treatments for him but that we would not tolerate disorderly conduct or threats to nursing staff.  He is requiring oxygen and I discussed with him that if he leaves AGAINST MEDICAL ADVICE he could get worse or even die.  I was able to pay calm the patient down and he is willing to stay for treatment.   [CH]  0603 Patient has slept comfortably all evening.  Improved after 1 DuoNeb.  Endorsed to nursing that he is homeless and just wants to sleep.  On my recheck, he is in no respiratory distress and O2 sats are greater than 90%.  Will discharge home with an inhaler.   [CH]    Clinical Course User Index [CH] Angell Honse, Mayer Masker, MD    Presents with shortness of breath.  O2 sats  88% on room air.  History of CHF and COPD.  He does not appear overtly volume overloaded.  He is in no respiratory distress.  He is afebrile and O2 sats on oxygen are in the low 90s.  He has diminished  breath sounds left greater than right with occasional wheeze.  Patient was given a DuoNeb.  See clinical course above.  Patient rested comfortably and was observed in the emergency department for greater than 6 hours.  He only required minimal intervention.  Suspect his O2 sats range in the low 90s at baseline.  He was given IM Decadron for presumed COPD exacerbation.  Patient also noted to be acutely intoxicated.  This is likely the cause of the patient's outburst.  We will discharge him with an inhaler.  After history, exam, and medical workup I feel the patient has been appropriately medically screened and is safe for discharge home. Pertinent diagnoses were discussed with the patient. Patient was given return precautions.   Final Clinical Impressions(s) / ED Diagnoses   Final diagnoses:  SOB (shortness of breath)  COPD exacerbation (HCC)  Alcoholic intoxication without complication [facility])    ED Discharge Orders    None       Wilkie Aye, Mayer Masker, MD 11/27/17 [phone]

## 2017-11-27 NOTE — ED Notes (Signed)
Patient left without vital signs.  

## 2017-11-27 NOTE — ED Notes (Signed)
Pt was cussing and yelling and not wanting to stay, he finally calmed down and agreed to stay, pt admits to being homeless and wanting to sleep

## 2017-11-27 NOTE — ED Notes (Signed)
Pt signed discharge and received his paperwork and an inhaler Pt wants to wait for a bus pass in the am from Social Work

## 2017-11-27 NOTE — ED Notes (Signed)
Pt sleeping, in no acute distress.

## 2017-11-28 ENCOUNTER — Encounter (HOSPITAL_COMMUNITY): Payer: Self-pay | Admitting: Emergency Medicine

## 2017-11-28 ENCOUNTER — Emergency Department (HOSPITAL_COMMUNITY): Payer: Medicaid Other

## 2017-11-28 ENCOUNTER — Other Ambulatory Visit: Payer: Self-pay

## 2017-11-28 ENCOUNTER — Emergency Department (HOSPITAL_COMMUNITY)
Admission: EM | Admit: 2017-11-28 | Discharge: 2017-11-28 | Disposition: A | Discharge status: Home / Self Care | Payer: Medicaid Other | Attending: Emergency Medicine | Admitting: Emergency Medicine

## 2017-11-28 DIAGNOSIS — I5022 Chronic systolic (congestive) heart failure: Secondary | ICD-10-CM | POA: Diagnosis not present

## 2017-11-28 DIAGNOSIS — F17210 Nicotine dependence, cigarettes, uncomplicated: Secondary | ICD-10-CM | POA: Diagnosis not present

## 2017-11-28 DIAGNOSIS — Z7982 Long term (current) use of aspirin: Secondary | ICD-10-CM | POA: Diagnosis not present

## 2017-11-28 DIAGNOSIS — F1010 Alcohol abuse, uncomplicated: Secondary | ICD-10-CM | POA: Diagnosis not present

## 2017-11-28 DIAGNOSIS — Z79899 Other long term (current) drug therapy: Secondary | ICD-10-CM | POA: Insufficient documentation

## 2017-11-28 DIAGNOSIS — J441 Chronic obstructive pulmonary disease with (acute) exacerbation: Secondary | ICD-10-CM | POA: Diagnosis not present

## 2017-11-28 DIAGNOSIS — R0602 Shortness of breath: Secondary | ICD-10-CM | POA: Diagnosis present

## 2017-11-28 DIAGNOSIS — Z590 Homelessness: Secondary | ICD-10-CM | POA: Insufficient documentation

## 2017-11-28 LAB — CBC WITH DIFFERENTIAL/PLATELET
ABS IMMATURE GRANULOCYTES: 0 10*3/uL (ref 0.0–0.1)
BASOS PCT: 1 %
Basophils Absolute: 0 10*3/uL (ref 0.0–0.1)
Eosinophils Absolute: 0 10*3/uL (ref 0.0–0.7)
Eosinophils Relative: 0 %
HCT: 51.3 % (ref 39.0–52.0)
Hemoglobin: 15.8 g/dL (ref 13.0–17.0)
Immature Granulocytes: 1 %
Lymphocytes Relative: 29 %
Lymphs Abs: 2.3 10*3/uL (ref 0.7–4.0)
MCH: 31.3 pg (ref 26.0–34.0)
MCHC: 30.8 g/dL (ref 30.0–36.0)
MCV: 101.6 fL — AB (ref 78.0–100.0)
MONO ABS: 0.5 10*3/uL (ref 0.1–1.0)
MONOS PCT: 6 %
NEUTROS ABS: 5.1 10*3/uL (ref 1.7–7.7)
Neutrophils Relative %: 63 %
PLATELETS: 233 10*3/uL (ref 150–400)
RBC: 5.05 MIL/uL (ref 4.22–5.81)
RDW: 13.9 % (ref 11.5–15.5)
WBC: 7.9 10*3/uL (ref 4.0–10.5)

## 2017-11-28 LAB — COMPREHENSIVE METABOLIC PANEL
ALT: 53 U/L — AB (ref 0–44)
AST: 36 U/L (ref 15–41)
Albumin: 3.9 g/dL (ref 3.5–5.0)
Alkaline Phosphatase: 33 U/L — ABNORMAL LOW (ref 38–126)
Anion gap: 10 (ref 5–15)
BUN: 17 mg/dL (ref 6–20)
CHLORIDE: 106 mmol/L (ref 98–111)
CO2: 31 mmol/L (ref 22–32)
CREATININE: 0.89 mg/dL (ref 0.61–1.24)
Calcium: 9 mg/dL (ref 8.9–10.3)
Glucose, Bld: 99 mg/dL (ref 70–99)
Potassium: 3.9 mmol/L (ref 3.5–5.1)
Sodium: 147 mmol/L — ABNORMAL HIGH (ref 135–145)
TOTAL PROTEIN: 7.1 g/dL (ref 6.5–8.1)
Total Bilirubin: 0.5 mg/dL (ref 0.3–1.2)

## 2017-11-28 LAB — I-STAT TROPONIN, ED: TROPONIN I, POC: 0.02 ng/mL (ref 0.00–0.08)

## 2017-11-28 LAB — ETHANOL: ALCOHOL ETHYL (B): 241 mg/dL — AB (ref <10)

## 2017-11-28 MED ORDER — PREDNISONE 20 MG PO TABS
60.0000 mg | ORAL_TABLET | Freq: Once | ORAL | Status: AC
  Administered 2017-11-28: 60 mg via ORAL
  Filled 2017-11-28: qty 3

## 2017-11-28 MED ORDER — IPRATROPIUM-ALBUTEROL 0.5-2.5 (3) MG/3ML IN SOLN
3.0000 mL | Freq: Once | RESPIRATORY_TRACT | Status: AC
  Administered 2017-11-28: 3 mL via RESPIRATORY_TRACT
  Filled 2017-11-28: qty 3

## 2017-11-28 MED ORDER — OXYCODONE-ACETAMINOPHEN 5-325 MG PO TABS
1.0000 | ORAL_TABLET | Freq: Once | ORAL | Status: AC
  Administered 2017-11-28: 1 via ORAL
  Filled 2017-11-28: qty 1

## 2017-11-28 NOTE — ED Notes (Signed)
Pt put on 2L O2 on [place].

## 2017-11-28 NOTE — ED Triage Notes (Signed)
Pt arrives to ED from EMS. Pt is homeless, with complaints of right arm and leg pain since this afternoon. EMS reports Pt called EMS after it started raining, with complaints of pain in his right arm and leg. Pt has chronic COPD and is suppose to be on O2 at home, but since pt is homeless, he does not use O2. Ems noted pt having labored breathing at scene and gave pt albuterol nebulizer in route. Pt placed in position of comfort with bed locked and lowered, call bell in reach.

## 2017-11-28 NOTE — ED Provider Notes (Signed)
[facility] EMERGENCY DEPARTMENT Provider Note   CSN: [id number] Arrival date & time: 11/28/17  7829     History   Chief Complaint Chief Complaint  Patient presents with  . Leg Pain    HPI Brian [name] is a 62 y.o. male.  Patient complains of shortness of breath and aching all over.  Patient has a history of COPD and alcohol abuse  The history is provided by the patient and medical records. No language interpreter was used.  Shortness of Breath  This is a recurrent problem. The problem occurs intermittently.The current episode started 3 to 5 hours ago. The problem has been resolved. Pertinent negatives include no fever, no headaches, no cough, no chest pain, no abdominal pain and no rash. It is unknown what precipitated the problem. He has tried ipratropium inhalers for the symptoms. The treatment provided moderate relief.    Past Medical History:  Diagnosis Date  . Alcohol abuse    Heavy alcohol abuse and homelessness  . Arthritis   . Cardiomyopathy (HCC)   . CHF (congestive heart failure) (HCC)   . Cocaine abuse (HCC)    And crack   . COPD (chronic obstructive pulmonary disease) (HCC)   . Heart failure   . Homelessness   . Noncompliance   . Paroxysmal VT (HCC)    a. first seen 2013 in setting of normal LVEF 55-60%, EF now decreased.  . Suicide attempt (HCC)   . Tobacco abuse     Patient Active Problem List   Diagnosis Date Noted  . Acute respiratory failure with hypoxia (HCC) 11/13/2017  . Goals of care, counseling/discussion   . Palliative care by specialist   . Acute on chronic systolic CHF (congestive heart failure), NYHA class 4 (HCC) 11/03/2017  . Hyperglycemia 11/03/2017  . Acute respiratory failure with hypoxia and hypercapnia (HCC) 10/26/2017  . Acute on chronic respiratory failure with hypoxia (HCC) 09/27/2017  . Tobacco dependence 09/27/2017  . Chronic systolic CHF (congestive heart failure) (HCC) 09/25/2017  . COPD with acute  exacerbation (HCC) 09/30/2015  . Substance induced mood disorder (HCC) 02/04/2013  . Polysubstance (excluding opioids) dependence, daily use (HCC) 02/03/2013  . Alcohol dependence (HCC) 02/03/2013  . Gout attack 11/12/2012  . Closed fracture of 5th metacarpal 11/12/2012  . Chest pain 08/27/2012  . Acute exacerbation of chronic obstructive pulmonary disease (COPD) (HCC)   . Homelessness   . Cocaine abuse [facility])     Past Surgical History:  Procedure Laterality Date  . INNER EAR SURGERY          Home Medications    Prior to Admission medications   Medication Sig Start Date End Date Taking? Authorizing Provider  albuterol (PROVENTIL HFA;VENTOLIN HFA) 108 (90 Base) MCG/ACT inhaler Inhale 2 puffs into the lungs every 6 (six) hours as needed for wheezing or shortness of breath. 11/08/17   [name], PA-C  aspirin 81 MG EC tablet Take 1 tablet (81 mg total) by mouth daily. 11/08/17   [name], PA-C  carvedilol (COREG) 6.25 MG tablet Take 1 tablet (6.25 mg total) by mouth 2 (two) times daily with a meal. 11/08/17 11/03/18  [name], PA-C  folic acid (FOLVITE) 1 MG tablet Take 1 tablet (1 mg total) by mouth daily. 11/08/17 05/07/18  [name], PA-C  furosemide (LASIX) 20 MG tablet Take 1 tablet (20 mg total) by mouth daily. 11/08/17   [name], PA-C  lisinopril (PRINIVIL,ZESTRIL) 5 MG tablet Take  1 tablet (5 mg total) by mouth daily. 11/08/17   [name], PA-C  mometasone-formoterol (DULERA) 200-5 MCG/ACT AERO Inhale 2 puffs into the lungs 2 (two) times daily. 11/08/17   [name], PA-C  nicotine (NICODERM CQ - DOSED IN MG/24 HOURS) 21 mg/24hr patch Place 1 patch (21 mg total) onto the skin daily. 11/08/17   [name], PA-C  nitroGLYCERIN (NITROSTAT) 0.4 MG SL tablet Place 1 tablet (0.4 mg total) under the tongue every 5 (five) minutes as needed for chest pain. 11/08/17   [name], PA-C  Omeprazole 20 MG TBEC Take 1 tablet (20 mg  total) by mouth daily. 11/16/17   Zannie Cove, MD  predniSONE (DELTASONE) 20 MG tablet Take 1-2 tablets (20-40 mg total) by mouth daily with breakfast. Take 40mg  for 2days then 20mg  for 2days then STOP 11/16/17   Zannie Cove, MD  traZODone (DESYREL) 50 MG tablet Take 1 tablet (50 mg total) by mouth at bedtime as needed for sleep. 11/16/17   Zannie Cove, MD    Family History Family History  Problem Relation Age of Onset  . Coronary artery disease Unknown   . Diabetes Unknown   . Cancer Sister     Social History Social History   Tobacco Use  . Smoking status: Current Every Day Smoker    Packs/day: 2.00    Types: Cigarettes  . Smokeless tobacco: Never Used  Substance Use Topics  . Alcohol use: Yes    Comment: Heavy. 5-10 40oz daily  . Drug use: No    Types: Marijuana, Cocaine    Comment: Marijuana and cocaine, crack     Allergies   Patient has no known allergies.   Review of Systems Review of Systems  Constitutional: Negative for appetite change, fatigue and fever.  HENT: Negative for congestion, ear discharge and sinus pressure.   Eyes: Negative for discharge.  Respiratory: Positive for shortness of breath. Negative for cough.   Cardiovascular: Negative for chest pain.  Gastrointestinal: Negative for abdominal pain and diarrhea.  Genitourinary: Negative for frequency and hematuria.  Musculoskeletal: Positive for arthralgias. Negative for back pain.  Skin: Negative for rash.  Neurological: Negative for seizures and headaches.  Psychiatric/Behavioral: Negative for hallucinations.     Physical Exam Updated Vital Signs BP (!) 141/94   Pulse 81   Temp 97.9 F (36.6 C) (Oral)   Resp (!) 21   Ht 6' (1.829 m)   Wt 84 kg   SpO2 100%   BMI 25.12 kg/m   Physical Exam  Constitutional: He is oriented to person, place, and time. He appears well-developed.  HENT:  Head: Normocephalic.  Eyes: Conjunctivae and EOM are normal. No scleral icterus.  Neck: Neck  supple. No thyromegaly present.  Cardiovascular: Normal rate and regular rhythm. Exam reveals no gallop and no friction rub.  No murmur heard. Pulmonary/Chest: No stridor. He has wheezes. He has no rales. He exhibits no tenderness.  Abdominal: He exhibits no distension. There is no tenderness. There is no rebound.  Musculoskeletal: Normal range of motion. He exhibits no edema.  Lymphadenopathy:    He has no cervical adenopathy.  Neurological: He is oriented to person, place, and time. He exhibits normal muscle tone. Coordination normal.  Skin: No rash noted. No erythema.  Psychiatric: He has a normal mood and affect. His behavior is normal.     ED Treatments / Results  Labs (all labs ordered are listed, but only abnormal results are displayed) Labs Reviewed  CBC WITH DIFFERENTIAL/PLATELET - Abnormal; Notable for the following components:      Result Value   MCV 101.6 (*)    All other components within normal limits  COMPREHENSIVE METABOLIC PANEL - Abnormal; Notable for the following components:   Sodium 147 (*)    ALT 53 (*)    Alkaline Phosphatase 33 (*)    All other components within normal limits  ETHANOL - Abnormal; Notable for the following components:   Alcohol, Ethyl (B) 241 (*)    All other components within normal limits  I-STAT TROPONIN, ED    EKG EKG Interpretation  Date/Time:  Wednesday November 28 2017 19:01:19 EDT Ventricular Rate:  98 PR Interval:    QRS Duration: 85 QT Interval:  344 QTC Calculation: 440 R Axis:   68 Text Interpretation:  Sinus rhythm Confirmed by Bethann Berkshire [phone]) on 11/28/2017 10:16:12 PM   Radiology Dg Chest 2 View  Result Date: 11/28/2017 CLINICAL DATA:  Chest pain and shortness of breath EXAM: CHEST - 2 VIEW COMPARISON:  November 26, 2017 FINDINGS: There is mild left base atelectasis. There is no edema or consolidation. Heart is mildly enlarged with pulmonary vascularity normal. No adenopathy. No appreciable bone lesions.  IMPRESSION: Mild left base atelectasis. No edema or consolidation. Stable cardiac prominence. Electronically Signed   By: Bretta Bang III M.D.   On: 11/28/2017 20:30   Dg Chest 2 View  Result Date: 11/26/2017 CLINICAL DATA:  Short of breath EXAM: CHEST - 2 VIEW COMPARISON:  11/12/2017, 11/03/2017 FINDINGS: Subsegmental atelectasis at the bases. Mild cardiomegaly. No pleural effusion. Vascularity within normal limits. No pneumothorax. IMPRESSION: Cardiomegaly.  Subsegmental atelectasis at both bases Electronically Signed   By: Jasmine Pang M.D.   On: 11/26/2017 23:15    Procedures Procedures (including critical care time)  Medications Ordered in ED Medications  ipratropium-albuterol (DUONEB) 0.5-2.5 (3) MG/3ML nebulizer solution 3 mL (3 mLs Nebulization Given 11/28/17 1923)  predniSONE (DELTASONE) tablet 60 mg (60 mg Oral Given 11/28/17 1923)  oxyCODONE-acetaminophen (PERCOCET/ROXICET) 5-325 MG per tablet 1 tablet (1 tablet Oral Given 11/28/17 1922)     Initial Impression / Assessment and Plan / ED Course  I have reviewed the triage vital signs and the nursing notes.  Pertinent labs & imaging results that were available during my care of the patient were reviewed by me and considered in my medical decision making (see chart for details).     Patient with mild exacerbation of COPD.  Patient improved with neb treatment.  Patient also has alcohol abuse.  He will be discharged home and instructed to use his inhaler and stop drinking alcohol follow-up with PCP  Final Clinical Impressions(s) / ED Diagnoses   Final diagnoses:  ETOH abuse  COPD exacerbation [facility])    ED Discharge Orders    None       Bethann Berkshire, MD 11/28/17 2244

## 2017-11-28 NOTE — ED Notes (Signed)
Patient verbalizes understanding of discharge instructions. Opportunity for questioning and answers were provided. Armband removed by staff, pt discharged from ED ambulatory.   

## 2017-11-28 NOTE — Discharge Instructions (Addendum)
Stop drinking alcohol and follow-up with your family doctor.  Use your inhaler for shortness of breath

## 2017-11-28 NOTE — Discharge Summary (Addendum)
Physician Discharge Summary  Brian [name] PCH:[id number] DOB: 1955-04-27 DOA: 11/12/2017  PCP: [name], PA-C  Admit date: 11/12/2017 Discharge date: 11/13/2017  Time spent: 35 minutes  Recommendations for Outpatient Follow-up:  1. LEFT AMA   Discharge Diagnoses:  Principal Problem:   Acute respiratory failure with hypoxia (HCC) Active Problems:   Polysubstance (excluding opioids) dependence, daily use (HCC)   COPD with acute exacerbation (HCC)   Chronic systolic CHF (congestive heart failure) (HCC)   Discharge Condition: stable  Diet recommendation: heart healthy  Filed Weights   11/14/17 0500 11/15/17 0500 11/16/17 0600  Weight: 85.7 kg 85.5 kg 84.3 kg    History of present illness:  Brian [name] is a 62 y.o. male with history of polysubstance abuse, COPD, systolic heart failure who was just discharged a week ago after being admitted for COPD and CHF presents with complaint of 2 days of chest pain which is mostly in the left side anterior chest wall nonradiating present even at rest with increasing shortness of breath productive cough and wheezin  Hospital Course:  Pt left AMA from the ED before I could see him  Discharge Exam: Vitals:   11/15/17 1936 11/15/17 2325  BP:  95/72  Pulse:  (!) 52  Resp:  17  Temp:  98.3 F (36.8 C)  SpO2: 93% 90%    General: AAOx3 Cardiovascular: S1S2/RRR Respiratory: improved air movement  Discharge Instructions   Discharge Instructions    Diet - low sodium heart healthy   Complete by:  As directed    Increase activity slowly   Complete by:  As directed      Allergies as of 11/16/2017   No Known Allergies     Medication List    TAKE these medications   albuterol 108 (90 Base) MCG/ACT inhaler Commonly known as:  PROVENTIL HFA;VENTOLIN HFA Inhale 2 puffs into the lungs every 6 (six) hours as needed for wheezing or shortness of breath.   aspirin 81 MG EC tablet Take 1 tablet (81 mg total) by mouth  daily.   carvedilol 6.25 MG tablet Commonly known as:  COREG Take 1 tablet (6.25 mg total) by mouth 2 (two) times daily with a meal.   folic acid 1 MG tablet Commonly known as:  FOLVITE Take 1 tablet (1 mg total) by mouth daily.   furosemide 20 MG tablet Commonly known as:  LASIX Take 1 tablet (20 mg total) by mouth daily.   lisinopril 5 MG tablet Commonly known as:  PRINIVIL,ZESTRIL Take 1 tablet (5 mg total) by mouth daily.   mometasone-formoterol 200-5 MCG/ACT Aero Commonly known as:  DULERA Inhale 2 puffs into the lungs 2 (two) times daily.   nicotine 21 mg/24hr patch Commonly known as:  NICODERM CQ - dosed in mg/24 hours Place 1 patch (21 mg total) onto the skin daily.   nitroGLYCERIN 0.4 MG SL tablet Commonly known as:  NITROSTAT Place 1 tablet (0.4 mg total) under the tongue every 5 (five) minutes as needed for chest pain.   Omeprazole 20 MG Tbec Take 1 tablet (20 mg total) by mouth daily.   predniSONE 20 MG tablet Commonly known as:  DELTASONE Take 1-2 tablets (20-40 mg total) by mouth daily with breakfast. Take 40mg  for 2days then 20mg  for 2days then STOP   traZODone 50 MG tablet Commonly known as:  DESYREL Take 1 tablet (50 mg total) by mouth at bedtime as needed for sleep.     ASK your doctor about these  medications   cefdinir 300 MG capsule Commonly known as:  OMNICEF Take 1 capsule (300 mg total) by mouth every 12 (twelve) hours for 2 days. Ask about: Should I take this medication?      No Known Allergies Follow-up Information    [facility] Follow up on 12/11/2017.   Why:  11:10 for hospital follow up  Contact information: [address] [place] [facility] [address] [phone]           The results of significant diagnostics from this hospitalization (including imaging, microbiology, ancillary and laboratory) are listed below for reference.    Significant Diagnostic Studies: Dg Chest 2  View  Result Date: 11/26/2017 CLINICAL DATA:  Short of breath EXAM: CHEST - 2 VIEW COMPARISON:  11/12/2017, 11/03/2017 FINDINGS: Subsegmental atelectasis at the bases. Mild cardiomegaly. No pleural effusion. Vascularity within normal limits. No pneumothorax. IMPRESSION: Cardiomegaly.  Subsegmental atelectasis at both bases Electronically Signed   By: Jasmine Pang M.D.   On: 11/26/2017 23:15   Dg Chest 2 View  Result Date: 11/03/2017 CLINICAL DATA:  Chest pain and shortness of breath for several hours EXAM: CHEST - 2 VIEW COMPARISON:  10/26/2017 FINDINGS: The heart size and mediastinal contours are within normal limits. Both lungs are clear. The visualized skeletal structures are unremarkable. IMPRESSION: No active cardiopulmonary disease. Electronically Signed   By: Alcide Clever M.D.   On: 11/03/2017 13:53   Dg Chest Portable 1 View  Result Date: 11/13/2017 CLINICAL DATA:  62 y/o  M; shortness of breath. EXAM: PORTABLE CHEST 1 VIEW COMPARISON:  11/03/2017 chest radiograph FINDINGS: Stable normal cardiac silhouette. Aortic atherosclerosis with calcification. Clear lungs. No pleural effusion or pneumothorax. No acute osseous abnormality is evident. IMPRESSION: No active disease. Electronically Signed   By: Mitzi Hansen M.D.   On: 11/13/2017 00:10    Microbiology: No results found for this or any previous visit (from the past 240 hour(s)).   Labs: Basic Metabolic Panel: Recent Labs  Lab 11/27/17 0010  NA 147*  K 5.4*  CL 108  CO2 30  GLUCOSE 121*  BUN 19  CREATININE 0.97  CALCIUM 9.2   Liver Function Tests: No results for input(s): AST, ALT, ALKPHOS, BILITOT, PROT, ALBUMIN in the last 168 hours. No results for input(s): LIPASE, AMYLASE in the last 168 hours. No results for input(s): AMMONIA in the last 168 hours. CBC: Recent Labs  Lab 11/27/17 0010  WBC 8.0  NEUTROABS 6.6  HGB 15.9  HCT 48.6  MCV 99.0  PLT 199   Cardiac Enzymes: No results for input(s): CKTOTAL,  CKMB, CKMBINDEX, TROPONINI in the last 168 hours. BNP: BNP (last 3 results) Recent Labs    10/14/17 1612 10/26/17 1920 11/03/17 1302  BNP 577.3* 258.0* 761.8*    ProBNP (last 3 results) No results for input(s): PROBNP in the last 8760 hours.  CBG: No results for input(s): GLUCAP in the last 168 hours.     Signed:  Zannie Cove MD.  [facility] 11/28/2017, 4:14 PM

## 2017-12-05 ENCOUNTER — Ambulatory Visit: Payer: Self-pay | Admitting: Cardiology

## 2017-12-06 ENCOUNTER — Encounter: Payer: Self-pay | Admitting: *Deleted

## 2017-12-11 ENCOUNTER — Ambulatory Visit (INDEPENDENT_AMBULATORY_CARE_PROVIDER_SITE_OTHER): Payer: Self-pay | Admitting: Physician Assistant

## 2017-12-15 ENCOUNTER — Encounter (HOSPITAL_COMMUNITY): Payer: Self-pay

## 2017-12-15 ENCOUNTER — Inpatient Hospital Stay (HOSPITAL_COMMUNITY)
Admission: EM | Admit: 2017-12-15 | Discharge: 2017-12-18 | DRG: 190 | Discharge status: Against Medical Advice | Payer: Medicaid Other | Attending: Internal Medicine | Admitting: Internal Medicine

## 2017-12-15 ENCOUNTER — Emergency Department (HOSPITAL_COMMUNITY): Payer: Medicaid Other

## 2017-12-15 DIAGNOSIS — F1410 Cocaine abuse, uncomplicated: Secondary | ICD-10-CM | POA: Diagnosis present

## 2017-12-15 DIAGNOSIS — Z79899 Other long term (current) drug therapy: Secondary | ICD-10-CM

## 2017-12-15 DIAGNOSIS — J9602 Acute respiratory failure with hypercapnia: Secondary | ICD-10-CM

## 2017-12-15 DIAGNOSIS — Z590 Homelessness: Secondary | ICD-10-CM

## 2017-12-15 DIAGNOSIS — I5042 Chronic combined systolic (congestive) and diastolic (congestive) heart failure: Secondary | ICD-10-CM | POA: Diagnosis present

## 2017-12-15 DIAGNOSIS — I429 Cardiomyopathy, unspecified: Secondary | ICD-10-CM | POA: Diagnosis present

## 2017-12-15 DIAGNOSIS — J9621 Acute and chronic respiratory failure with hypoxia: Secondary | ICD-10-CM | POA: Diagnosis present

## 2017-12-15 DIAGNOSIS — Z09 Encounter for follow-up examination after completed treatment for conditions other than malignant neoplasm: Secondary | ICD-10-CM

## 2017-12-15 DIAGNOSIS — Z7982 Long term (current) use of aspirin: Secondary | ICD-10-CM

## 2017-12-15 DIAGNOSIS — R109 Unspecified abdominal pain: Secondary | ICD-10-CM

## 2017-12-15 DIAGNOSIS — J9811 Atelectasis: Secondary | ICD-10-CM | POA: Diagnosis present

## 2017-12-15 DIAGNOSIS — J441 Chronic obstructive pulmonary disease with (acute) exacerbation: Principal | ICD-10-CM | POA: Diagnosis present

## 2017-12-15 DIAGNOSIS — J9622 Acute and chronic respiratory failure with hypercapnia: Secondary | ICD-10-CM | POA: Diagnosis present

## 2017-12-15 DIAGNOSIS — I110 Hypertensive heart disease with heart failure: Secondary | ICD-10-CM | POA: Diagnosis present

## 2017-12-15 DIAGNOSIS — F17210 Nicotine dependence, cigarettes, uncomplicated: Secondary | ICD-10-CM | POA: Diagnosis present

## 2017-12-15 DIAGNOSIS — I10 Essential (primary) hypertension: Secondary | ICD-10-CM

## 2017-12-15 DIAGNOSIS — J9601 Acute respiratory failure with hypoxia: Secondary | ICD-10-CM

## 2017-12-15 LAB — I-STAT TROPONIN, ED: Troponin i, poc: 0.04 ng/mL (ref 0.00–0.08)

## 2017-12-15 LAB — CBC WITH DIFFERENTIAL/PLATELET
Abs Immature Granulocytes: 0 10*3/uL (ref 0.0–0.1)
Basophils Absolute: 0.1 10*3/uL (ref 0.0–0.1)
Basophils Relative: 1 %
EOS PCT: 0 %
Eosinophils Absolute: 0 10*3/uL (ref 0.0–0.7)
HEMATOCRIT: 52.2 % — AB (ref 39.0–52.0)
HEMOGLOBIN: 16.3 g/dL (ref 13.0–17.0)
IMMATURE GRANULOCYTES: 0 %
LYMPHS ABS: 2 10*3/uL (ref 0.7–4.0)
LYMPHS PCT: 27 %
MCH: 31.8 pg (ref 26.0–34.0)
MCHC: 31.2 g/dL (ref 30.0–36.0)
MCV: 102 fL — ABNORMAL HIGH (ref 78.0–100.0)
MONOS PCT: 8 %
Monocytes Absolute: 0.6 10*3/uL (ref 0.1–1.0)
Neutro Abs: 4.7 10*3/uL (ref 1.7–7.7)
Neutrophils Relative %: 64 %
Platelets: 202 10*3/uL (ref 150–400)
RBC: 5.12 MIL/uL (ref 4.22–5.81)
RDW: 14.1 % (ref 11.5–15.5)
WBC: 7.4 10*3/uL (ref 4.0–10.5)

## 2017-12-15 LAB — BASIC METABOLIC PANEL
ANION GAP: 12 (ref 5–15)
BUN: 17 mg/dL (ref 6–20)
CALCIUM: 9 mg/dL (ref 8.9–10.3)
CHLORIDE: 104 mmol/L (ref 98–111)
CO2: 27 mmol/L (ref 22–32)
CREATININE: 0.94 mg/dL (ref 0.61–1.24)
GFR calc non Af Amer: >60 mL/min (ref >60)
Glucose, Bld: 97 mg/dL (ref 70–99)
Potassium: 4 mmol/L (ref 3.5–5.1)
SODIUM: 143 mmol/L (ref 135–145)

## 2017-12-15 LAB — I-STAT CG4 LACTIC ACID, ED: Lactic Acid, Venous: 1.4 mmol/L (ref 0.5–1.9)

## 2017-12-15 LAB — I-STAT ARTERIAL BLOOD GAS, ED
ACID-BASE DEFICIT: 1 mmol/L (ref 0.0–2.0)
Bicarbonate: 28.7 mmol/L — ABNORMAL HIGH (ref 20.0–28.0)
O2 Saturation: 96 %
PH ART: 7.237 — AB (ref 7.350–7.450)
TCO2: 31 mmol/L (ref 22–32)
pCO2 arterial: 67.5 mmHg (ref 32.0–48.0)
pO2, Arterial: 101 mmHg (ref 83.0–108.0)

## 2017-12-15 LAB — CBC
HCT: 53.9 % — ABNORMAL HIGH (ref 39.0–52.0)
Hemoglobin: 16.6 g/dL (ref 13.0–17.0)
MCH: 31.9 pg (ref 26.0–34.0)
MCHC: 30.8 g/dL (ref 30.0–36.0)
MCV: 103.5 fL — AB (ref 78.0–100.0)
PLATELETS: 193 10*3/uL (ref 150–400)
RBC: 5.21 MIL/uL (ref 4.22–5.81)
RDW: 14.3 % (ref 11.5–15.5)
WBC: 7.5 10*3/uL (ref 4.0–10.5)

## 2017-12-15 LAB — GLUCOSE, CAPILLARY: GLUCOSE-CAPILLARY: 120 mg/dL — AB (ref 70–99)

## 2017-12-15 LAB — CREATININE, SERUM
Creatinine, Ser: 0.92 mg/dL (ref 0.61–1.24)
GFR calc Af Amer: >60 mL/min (ref >60)
GFR calc non Af Amer: >60 mL/min (ref >60)

## 2017-12-15 LAB — BRAIN NATRIURETIC PEPTIDE: B NATRIURETIC PEPTIDE 5: 252.3 pg/mL — AB (ref 0.0–100.0)

## 2017-12-15 MED ORDER — FUROSEMIDE 20 MG PO TABS
20.0000 mg | ORAL_TABLET | Freq: Every day | ORAL | Status: DC
  Administered 2017-12-16 – 2017-12-17 (×2): 20 mg via ORAL
  Filled 2017-12-15 (×2): qty 1

## 2017-12-15 MED ORDER — LISINOPRIL 5 MG PO TABS
5.0000 mg | ORAL_TABLET | Freq: Every day | ORAL | Status: DC
  Administered 2017-12-16 – 2017-12-18 (×3): 5 mg via ORAL
  Filled 2017-12-15 (×3): qty 1

## 2017-12-15 MED ORDER — PANTOPRAZOLE SODIUM 40 MG PO TBEC
40.0000 mg | DELAYED_RELEASE_TABLET | Freq: Every day | ORAL | Status: DC
  Administered 2017-12-16 – 2017-12-18 (×3): 40 mg via ORAL
  Filled 2017-12-15 (×3): qty 1

## 2017-12-15 MED ORDER — CARVEDILOL 6.25 MG PO TABS
6.2500 mg | ORAL_TABLET | Freq: Two times a day (BID) | ORAL | Status: DC
  Administered 2017-12-16 – 2017-12-18 (×5): 6.25 mg via ORAL
  Filled 2017-12-15 (×5): qty 1

## 2017-12-15 MED ORDER — METHYLPREDNISOLONE SODIUM SUCC 125 MG IJ SOLR
125.0000 mg | Freq: Once | INTRAMUSCULAR | Status: AC
  Administered 2017-12-15: 125 mg via INTRAVENOUS
  Filled 2017-12-15: qty 2

## 2017-12-15 MED ORDER — TRAZODONE HCL 50 MG PO TABS
50.0000 mg | ORAL_TABLET | Freq: Every evening | ORAL | Status: DC | PRN
  Administered 2017-12-17 (×2): 50 mg via ORAL
  Filled 2017-12-15 (×3): qty 1

## 2017-12-15 MED ORDER — NICOTINE 21 MG/24HR TD PT24
21.0000 mg | MEDICATED_PATCH | Freq: Every day | TRANSDERMAL | Status: DC
  Administered 2017-12-15 – 2017-12-18 (×4): 21 mg via TRANSDERMAL
  Filled 2017-12-15 (×4): qty 1

## 2017-12-15 MED ORDER — IPRATROPIUM-ALBUTEROL 0.5-2.5 (3) MG/3ML IN SOLN
3.0000 mL | Freq: Once | RESPIRATORY_TRACT | Status: AC
  Administered 2017-12-15: 3 mL via RESPIRATORY_TRACT

## 2017-12-15 MED ORDER — FOLIC ACID 1 MG PO TABS: 1.0000 mg | ORAL_TABLET | Freq: Every day | ORAL | Status: DC

## 2017-12-15 MED ORDER — ASPIRIN EC 81 MG PO TBEC
81.0000 mg | DELAYED_RELEASE_TABLET | Freq: Every day | ORAL | Status: DC
  Administered 2017-12-16 – 2017-12-18 (×3): 81 mg via ORAL
  Filled 2017-12-15 (×3): qty 1

## 2017-12-15 MED ORDER — SODIUM CHLORIDE 0.9 % IV SOLN
INTRAVENOUS | Status: DC
  Administered 2017-12-15: 18:00:00 via INTRAVENOUS

## 2017-12-15 MED ORDER — IPRATROPIUM-ALBUTEROL 0.5-2.5 (3) MG/3ML IN SOLN
RESPIRATORY_TRACT | Status: AC
  Administered 2017-12-15: 3 mL via RESPIRATORY_TRACT
  Filled 2017-12-15: qty 3

## 2017-12-15 MED ORDER — ENOXAPARIN SODIUM 40 MG/0.4ML ~~LOC~~ SOLN
40.0000 mg | SUBCUTANEOUS | Status: DC
  Administered 2017-12-16 – 2017-12-17 (×2): 40 mg via SUBCUTANEOUS
  Filled 2017-12-15 (×3): qty 0.4

## 2017-12-15 MED ORDER — METHYLPREDNISOLONE SODIUM SUCC 125 MG IJ SOLR
125.0000 mg | Freq: Four times a day (QID) | INTRAMUSCULAR | Status: DC
  Administered 2017-12-15 – 2017-12-18 (×12): 125 mg via INTRAVENOUS
  Filled 2017-12-15 (×13): qty 2

## 2017-12-15 MED ORDER — IPRATROPIUM-ALBUTEROL 0.5-2.5 (3) MG/3ML IN SOLN: 3.0000 mL | RESPIRATORY_TRACT | Status: DC | PRN

## 2017-12-15 MED ORDER — SODIUM CHLORIDE 0.9 % IV BOLUS
1000.0000 mL | Freq: Once | INTRAVENOUS | Status: AC
  Administered 2017-12-15: 1000 mL via INTRAVENOUS

## 2017-12-15 NOTE — ED Provider Notes (Signed)
Discussed with hospitalist who will admit   Brian Hong, MD 12/15/17 1645

## 2017-12-15 NOTE — ED Triage Notes (Signed)
[facility] gave 1.4 Nitro

## 2017-12-15 NOTE — ED Provider Notes (Signed)
[facility] EMERGENCY DEPARTMENT Provider Note   CSN: [id number] Arrival date & time: 12/15/17  1408     History   Chief Complaint Chief Complaint  Patient presents with  . Respiratory Distress    HPI Brian [name] is a 62 y.o. male.  Pt presents to the ED today with sob.  The pt has a hx of COPD and has been sob today.  EMS said O2 sat was 84%, so they put him on cpap.  The pt is feeling a little better now.  They also gave him nitro, although he denies cp.  Pt denies f/c.     Past Medical History:  Diagnosis Date  . Alcohol abuse    Heavy alcohol abuse and homelessness  . Arthritis   . Cardiomyopathy (HCC)   . CHF (congestive heart failure) (HCC)   . Cocaine abuse (HCC)    And crack   . COPD (chronic obstructive pulmonary disease) (HCC)   . Heart failure   . Homelessness   . Noncompliance   . Paroxysmal VT (HCC)    a. first seen 2013 in setting of normal LVEF 55-60%, EF now decreased.  . Suicide attempt (HCC)   . Tobacco abuse     Patient Active Problem List   Diagnosis Date Noted  . COPD exacerbation (HCC) 12/15/2017  . Acute respiratory failure with hypoxia (HCC) 11/13/2017  . Goals of care, counseling/discussion   . Palliative care by specialist   . Acute on chronic systolic CHF (congestive heart failure), NYHA class 4 (HCC) 11/03/2017  . Hyperglycemia 11/03/2017  . Acute respiratory failure with hypoxia and hypercapnia (HCC) 10/26/2017  . Acute on chronic respiratory failure with hypoxia (HCC) 09/27/2017  . Tobacco dependence 09/27/2017  . Chronic systolic CHF (congestive heart failure) (HCC) 09/25/2017  . COPD with acute exacerbation (HCC) 09/30/2015  . Substance induced mood disorder (HCC) 02/04/2013  . Polysubstance (excluding opioids) dependence, daily use (HCC) 02/03/2013  . Alcohol dependence (HCC) 02/03/2013  . Gout attack 11/12/2012  . Closed fracture of 5th metacarpal 11/12/2012  . Chest pain 08/27/2012  . Acute  exacerbation of chronic obstructive pulmonary disease (COPD) (HCC)   . Homelessness   . Cocaine abuse [facility])     Past Surgical History:  Procedure Laterality Date  . INNER EAR SURGERY          Home Medications    Prior to Admission medications   Medication Sig Start Date End Date Taking? Authorizing Provider  carvedilol (COREG) 6.25 MG tablet Take 1 tablet (6.25 mg total) by mouth 2 (two) times daily with a meal. 11/08/17 11/03/18 Yes [name], PA-C  folic acid (FOLVITE) 1 MG tablet Take 1 tablet (1 mg total) by mouth daily. 11/08/17 05/07/18 Yes [name], PA-C  furosemide (LASIX) 20 MG tablet Take 1 tablet (20 mg total) by mouth daily. 11/08/17  Yes [name], PA-C  mometasone-formoterol [facility]) 200-5 MCG/ACT AERO Inhale 2 puffs into the lungs 2 (two) times daily. 11/08/17  Yes [name], PA-C  nicotine (NICODERM CQ - DOSED IN MG/24 HOURS) 21 mg/24hr patch Place 1 patch (21 mg total) onto the skin daily. 11/08/17  Yes [name], PA-C  nitroGLYCERIN (NITROSTAT) 0.4 MG SL tablet Place 1 tablet (0.4 mg total) under the tongue every 5 (five) minutes as needed for chest pain. 11/08/17  Yes [name], PA-C  albuterol (PROVENTIL HFA;VENTOLIN HFA) 108 (90 Base) MCG/ACT inhaler Inhale 2 puffs into the lungs every  6 (six) hours as needed for wheezing or shortness of breath. Patient not taking: Reported on 12/15/2017 11/08/17   [name], PA-C  aspirin 81 MG EC tablet Take 1 tablet (81 mg total) by mouth daily. Patient not taking: Reported on 12/15/2017 11/08/17   [name], PA-C  lisinopril (PRINIVIL,ZESTRIL) 5 MG tablet Take 1 tablet (5 mg total) by mouth daily. Patient not taking: Reported on 12/15/2017 11/08/17   [name], PA-C  Omeprazole 20 MG TBEC Take 1 tablet (20 mg total) by mouth daily. Patient not taking: Reported on 12/15/2017 11/16/17   Zannie Cove, MD  predniSONE (DELTASONE) 20 MG tablet Take 1-2 tablets (20-40 mg total) by  mouth daily with breakfast. Take 40mg  for 2days then 20mg  for 2days then STOP Patient not taking: Reported on 12/15/2017 11/16/17   Zannie Cove, MD  traZODone (DESYREL) 50 MG tablet Take 1 tablet (50 mg total) by mouth at bedtime as needed for sleep. Patient not taking: Reported on 12/15/2017 11/16/17   Zannie Cove, MD    Family History Family History  Problem Relation Age of Onset  . Coronary artery disease Unknown   . Diabetes Unknown   . Cancer Sister     Social History Social History   Tobacco Use  . Smoking status: Current Every Day Smoker    Packs/day: 2.00    Types: Cigarettes  . Smokeless tobacco: Never Used  Substance Use Topics  . Alcohol use: Yes    Comment: Heavy. 5-10 40oz daily  . Drug use: No    Types: Marijuana, Cocaine    Comment: Marijuana and cocaine, crack     Allergies   Patient has no known allergies.   Review of Systems Review of Systems  Respiratory: Positive for shortness of breath.      Physical Exam Updated Vital Signs BP 132/80 (BP Location: Right Arm)   Pulse 79   Temp 98.2 F (36.8 C) (Oral)   Resp 20   Ht 6' (1.829 m)   Wt 84 kg   SpO2 93%   BMI 25.12 kg/m   Physical Exam  Constitutional: He is oriented to person, place, and time. He appears well-developed and well-nourished.  HENT:  Head: Normocephalic and atraumatic.  Right Ear: External ear normal.  Left Ear: External ear normal.  Nose: Nose normal.  Mouth/Throat: Oropharynx is clear and moist.  Eyes: Pupils are equal, round, and reactive to light. Conjunctivae and EOM are normal.  Neck: Normal range of motion. Neck supple.  Cardiovascular: Normal rate, regular rhythm, normal heart sounds and intact distal pulses.  Pulmonary/Chest: Accessory muscle usage present. Tachypnea noted. He has wheezes.  Abdominal: Soft. Bowel sounds are normal.  Musculoskeletal: He exhibits edema.  Neurological: He is alert and oriented to person, place, and time.  Skin: Skin is warm.    Psychiatric: He has a normal mood and affect. His behavior is normal. Judgment and thought content normal.  Nursing note and vitals reviewed.    ED Treatments / Results  Labs (all labs ordered are listed, but only abnormal results are displayed) Labs Reviewed  CBC WITH DIFFERENTIAL/PLATELET - Abnormal; Notable for the following components:      Result Value   HCT 52.2 (*)    MCV 102.0 (*)    All other components within normal limits  BRAIN NATRIURETIC PEPTIDE - Abnormal; Notable for the following components:   B Natriuretic Peptide 252.3 (*)    All other components within normal limits  CBC - Abnormal;  Notable for the following components:   HCT 53.9 (*)    MCV 103.5 (*)    All other components within normal limits  GLUCOSE, CAPILLARY - Abnormal; Notable for the following components:   Glucose-Capillary 120 (*)    All other components within normal limits  I-STAT ARTERIAL BLOOD GAS, ED - Abnormal; Notable for the following components:   pH, Arterial 7.237 (*)    pCO2 arterial 67.5 (*)    Bicarbonate 28.7 (*)    All other components within normal limits  CULTURE, BLOOD (ROUTINE X 2)  CULTURE, BLOOD (ROUTINE X 2)  MRSA PCR SCREENING  SURGICAL PCR SCREEN  BASIC METABOLIC PANEL  CREATININE, SERUM  HIV ANTIBODY (ROUTINE TESTING)  BLOOD GAS, ARTERIAL  I-STAT TROPONIN, ED  I-STAT CG4 LACTIC ACID, ED  I-STAT CG4 LACTIC ACID, ED    EKG EKG Interpretation  Date/Time:  Saturday December 15 2017 14:13:59 EDT Ventricular Rate:  91 PR Interval:    QRS Duration: 85 QT Interval:  365 QTC Calculation: 450 R Axis:   70 Text Interpretation:  Sinus rhythm No significant change since last tracing Confirmed by Jacalyn Lefevre [id number]) on 12/15/2017 2:30:44 PM Also confirmed by Jacalyn Lefevre [phone]), editor Josephine Igo [id number])  on 12/15/2017 2:41:53 PM   Radiology Dg Chest Port 1 View  Result Date: 12/15/2017 CLINICAL DATA:  Shortness of breath. Hypoxia. COPD and congestive  heart failure. EXAM: PORTABLE CHEST 1 VIEW COMPARISON:  11/28/2017 FINDINGS: Stable mild cardiomegaly. Aortic atherosclerosis. Both lungs are clear. IMPRESSION: Stable mild cardiomegaly.  No active lung disease. Electronically Signed   By: Myles Rosenthal M.D.   On: 12/15/2017 14:49    Procedures Procedures (including critical care time)  Medications Ordered in ED Medications  ipratropium-albuterol (DUONEB) 0.5-2.5 (3) MG/3ML nebulizer solution 3 mL (has no administration in time range)  methylPREDNISolone sodium succinate (SOLU-MEDROL) 125 mg/2 mL injection 125 mg (125 mg Intravenous Given 12/16/17 0636)  aspirin EC tablet 81 mg (81 mg Oral Not Given 12/15/17 1826)  furosemide (LASIX) tablet 20 mg (20 mg Oral Not Given 12/15/17 1827)  carvedilol (COREG) tablet 6.25 mg (6.25 mg Oral Not Given 12/15/17 1827)  lisinopril (PRINIVIL,ZESTRIL) tablet 5 mg (5 mg Oral Not Given 12/15/17 1827)  traZODone (DESYREL) tablet 50 mg (has no administration in time range)  folic acid (FOLVITE) tablet 1 mg (1 mg Oral Not Given 12/15/17 1828)  nicotine (NICODERM CQ - dosed in mg/24 hours) patch 21 mg (21 mg Transdermal Patch Applied 12/15/17 1826)  pantoprazole (PROTONIX) EC tablet 40 mg (40 mg Oral Not Given 12/15/17 1828)  enoxaparin (LOVENOX) injection 40 mg (40 mg Subcutaneous Not Given 12/15/17 1820)  0.9 %  sodium chloride infusion ( Intravenous New Bag/Given 12/15/17 1803)  mupirocin ointment (BACTROBAN) 2 % 1 application (has no administration in time range)  ipratropium-albuterol (DUONEB) 0.5-2.5 (3) MG/3ML nebulizer solution 3 mL (3 mLs Nebulization Given 12/15/17 1415)  sodium chloride 0.9 % bolus 1,000 mL (0 mLs Intravenous Stopped 12/15/17 1614)  methylPREDNISolone sodium succinate (SOLU-MEDROL) 125 mg/2 mL injection 125 mg (125 mg Intravenous Given 12/15/17 1433)     Initial Impression / Assessment and Plan / ED Course  I have reviewed the triage vital signs and the nursing notes.  Pertinent labs & imaging results that  were available during my care of the patient were reviewed by me and considered in my medical decision making (see chart for details).  CRITICAL CARE Performed by: Jacalyn Lefevre   Total critical care time: 30 minutes  Critical care time was exclusive of separately billable procedures and treating other patients.  Critical care was necessary to treat or prevent imminent or life-threatening deterioration.  Critical care was time spent personally by me on the following activities: development of treatment plan with patient and/or surrogate as well as nursing, discussions with consultants, evaluation of patient's response to treatment, examination of patient, obtaining history from patient or surrogate, ordering and performing treatments and interventions, ordering and review of laboratory studies, ordering and review of radiographic studies, pulse oximetry and re-evaluation of patient's condition.  Pt changed to BIPAP when he arrived and is doing well on bipap.  The pt's bp has improved with IVFs.  Low bp likely due to nitro.  No pna on cxr, likely just copd.  Pt will be admitted to the hospitalist.  Final Clinical Impressions(s) / ED Diagnoses   Final diagnoses:  COPD exacerbation (HCC)  Acute respiratory failure with hypoxia and hypercapnia [facility])    ED Discharge Orders    None       Jacalyn Lefevre, MD 12/16/17 [phone]

## 2017-12-15 NOTE — ED Triage Notes (Signed)
Pt arrived via [facility] from "[address]" where they called EMS for pts difficulty breathing.  Pt arrives on CPAP 92%, Dr. Particia Nearing and resp tech at bedside.

## 2017-12-15 NOTE — ED Notes (Signed)
Got patient undress on the monitor did ekg shown to er doctor patient is resting with call bell in reach 

## 2017-12-15 NOTE — H&P (Addendum)
History and Physical  Brian [name] FXT:[id number] DOB: 09/19/1955 DOA: 12/15/2017  Referring physician: Dr. Particia Nearing  PCP: [name], PA-C  Outpatient Specialists: None Patient coming from: Homeless & is able to ambulate ambulate yes  Chief Complaint: Shortness of breath  HPI: Brian [name] is a 62 y.o. male with medical history significant for COPD, congestive heart failure cardiomegaly cardiomyopathy alcohol abuse with multiple substance abuse including cocaine tobacco abuse, who presented to the emergency room department today by EMS for shortness of breath EMS noted his oxygen to be 84 when he was assessed so they put him on CPAP and gave him nitroglycerin patient denies any chest pain no fever no cough   ED Course: He was given 1 L IV bolus methylprednisolone IV and DuoNeb with improvement in his O2 sat and his wheezing and shortness of breath was markedly improved  Review of Systems:  Pt complains of shortness of breath  Pt denies any chest pain fever diarrhea abdominal pain.  Review of systems are otherwise negative   Past Medical History:  Diagnosis Date  . Alcohol abuse    Heavy alcohol abuse and homelessness  . Arthritis   . Cardiomyopathy (HCC)   . CHF (congestive heart failure) (HCC)   . Cocaine abuse (HCC)    And crack   . COPD (chronic obstructive pulmonary disease) (HCC)   . Heart failure   . Homelessness   . Noncompliance   . Paroxysmal VT (HCC)    a. first seen 2013 in setting of normal LVEF 55-60%, EF now decreased.  . Suicide attempt (HCC)   . Tobacco abuse    Past Surgical History:  Procedure Laterality Date  . INNER EAR SURGERY      Social History:  reports that he has been smoking cigarettes. He has been smoking about 2.00 packs per day. He has never used smokeless tobacco. He reports that he drinks alcohol. He reports that he does not use drugs.   No Known Allergies  Family History  Problem Relation Age of Onset  . Coronary  artery disease Unknown   . Diabetes Unknown   . Cancer Sister       Prior to Admission medications   Medication Sig Start Date End Date Taking? Authorizing Provider  carvedilol (COREG) 6.25 MG tablet Take 1 tablet (6.25 mg total) by mouth 2 (two) times daily with a meal. 11/08/17 11/03/18 Yes [name], PA-C  folic acid (FOLVITE) 1 MG tablet Take 1 tablet (1 mg total) by mouth daily. 11/08/17 05/07/18 Yes [name], PA-C  furosemide (LASIX) 20 MG tablet Take 1 tablet (20 mg total) by mouth daily. 11/08/17  Yes [name], PA-C  mometasone-formoterol [facility]) 200-5 MCG/ACT AERO Inhale 2 puffs into the lungs 2 (two) times daily. 11/08/17  Yes [name], PA-C  nicotine (NICODERM CQ - DOSED IN MG/24 HOURS) 21 mg/24hr patch Place 1 patch (21 mg total) onto the skin daily. 11/08/17  Yes [name], PA-C  nitroGLYCERIN (NITROSTAT) 0.4 MG SL tablet Place 1 tablet (0.4 mg total) under the tongue every 5 (five) minutes as needed for chest pain. 11/08/17  Yes [name], PA-C  albuterol (PROVENTIL HFA;VENTOLIN HFA) 108 (90 Base) MCG/ACT inhaler Inhale 2 puffs into the lungs every 6 (six) hours as needed for wheezing or shortness of breath. Patient not taking: Reported on 12/15/2017 11/08/17   [name], PA-C  aspirin 81 MG EC tablet Take 1 tablet (81 mg total)  by mouth daily. Patient not taking: Reported on 12/15/2017 11/08/17   [name], PA-C  lisinopril (PRINIVIL,ZESTRIL) 5 MG tablet Take 1 tablet (5 mg total) by mouth daily. Patient not taking: Reported on 12/15/2017 11/08/17   [name], PA-C  Omeprazole 20 MG TBEC Take 1 tablet (20 mg total) by mouth daily. Patient not taking: Reported on 12/15/2017 11/16/17   Zannie Cove, MD  predniSONE (DELTASONE) 20 MG tablet Take 1-2 tablets (20-40 mg total) by mouth daily with breakfast. Take 40mg  for 2days then 20mg  for 2days then STOP Patient not taking: Reported on 12/15/2017 11/16/17   Zannie Cove, MD    traZODone (DESYREL) 50 MG tablet Take 1 tablet (50 mg total) by mouth at bedtime as needed for sleep. Patient not taking: Reported on 12/15/2017 11/16/17   Zannie Cove, MD    Physical Exam: BP 95/66   Pulse 65   Temp (!) 96.1 F (35.6 C) (Axillary)   Resp 17   Ht 6' (1.829 m)   Wt 84 kg   SpO2 97%   BMI 25.12 kg/m   General: Obese sleeping easily arousable alert oriented to place person and time Eyes: PERRLA EOMI ENT: No rhinorrhea no congestion Neck: Supple no bruit Cardiovascular: Normal rate and rhythm normal heart rate and heart sounds intact pulses Respiratory: No wheezing slightly increased respiratory rate but not as tachycardic as he was when he first came in he came in with accessory muscle use as well as wheezing Abdomen: Protuberant soft nontender no hepatosplenomegaly Skin: Warm and dry no rashes Musculoskeletal: Moves all 4 extremities.  There is trace edema bilaterally Psychiatric: Appropriate normal mood and affect Neurologic: Alert oriented x3          Labs on Admission:  Basic Metabolic Panel: Recent Labs  Lab 12/15/17 1432  NA 143  K 4.0  CL 104  CO2 27  GLUCOSE 97  BUN 17  CREATININE 0.94  CALCIUM 9.0   Liver Function Tests: No results for input(s): AST, ALT, ALKPHOS, BILITOT, PROT, ALBUMIN in the last 168 hours. No results for input(s): LIPASE, AMYLASE in the last 168 hours. No results for input(s): AMMONIA in the last 168 hours. CBC: Recent Labs  Lab 12/15/17 1432  WBC 7.4  NEUTROABS 4.7  HGB 16.3  HCT 52.2*  MCV 102.0*  PLT 202   Cardiac Enzymes: No results for input(s): CKTOTAL, CKMB, CKMBINDEX, TROPONINI in the last 168 hours.  BNP (last 3 results) Recent Labs    10/26/17 1920 11/03/17 1302 12/15/17 1442  BNP 258.0* 761.8* 252.3*    ProBNP (last 3 results) No results for input(s): PROBNP in the last 8760 hours.  CBG: No results for input(s): GLUCAP in the last 168 hours.  Radiological Exams on Admission: Dg Chest  Port 1 View  Result Date: 12/15/2017 CLINICAL DATA:  Shortness of breath. Hypoxia. COPD and congestive heart failure. EXAM: PORTABLE CHEST 1 VIEW COMPARISON:  11/28/2017 FINDINGS: Stable mild cardiomegaly. Aortic atherosclerosis. Both lungs are clear. IMPRESSION: Stable mild cardiomegaly.  No active lung disease. Electronically Signed   By: Myles Rosenthal M.D.   On: 12/15/2017 14:49    EKG:  sinus rhythm no ischemia per ER MD  Assessment/Plan Present on Admission: . COPD with acute exacerbation (HCC)   COPD with acute exacerbation Acute respiratory failure with hypoxia and hypercarbia his initial O2 was 80- 84 on room air he is currently on BiPAP and he is saturating well I have consulted pulmonary Dr.W.[email or web address] Congestive heart failure he  is BNP slightly elevated we will continue his Lasix. Tobacco abuse.  We will start him on nicotine replacement therapy with nicotine patch. History of hypertension with mild hypotension he got better with IV fluid was most likely due to nitroglycerin that was given by EMS Homelessness social worker consult will be obtained Polysubstance abuse We will admit him to inpatient in the stepdown unit for further management will be dictated by his course in the hospital     Active Problems:   COPD with acute exacerbation (HCC)   DVT prophylaxis: Lovenox  Code Status: Full  Family Communication: Patient no other family at bedside  Disposition Plan: To be determined  Consults called: Pulmonary DR Claudette Head   Admission status: Inpatient/stepdown unit    Myrtie Neither MD [facility] Pager [phone]  If 7PM-7AM, please contact night-coverage [email or web address] Password [email or web address]  12/15/2017, 5:29 PM

## 2017-12-15 NOTE — ED Notes (Signed)
Portable CXR at bedside.

## 2017-12-16 ENCOUNTER — Other Ambulatory Visit: Payer: Self-pay

## 2017-12-16 ENCOUNTER — Encounter (HOSPITAL_COMMUNITY): Payer: Self-pay

## 2017-12-16 DIAGNOSIS — J441 Chronic obstructive pulmonary disease with (acute) exacerbation: Principal | ICD-10-CM

## 2017-12-16 DIAGNOSIS — J9601 Acute respiratory failure with hypoxia: Secondary | ICD-10-CM

## 2017-12-16 DIAGNOSIS — Z720 Tobacco use: Secondary | ICD-10-CM

## 2017-12-16 DIAGNOSIS — J9602 Acute respiratory failure with hypercapnia: Secondary | ICD-10-CM

## 2017-12-16 DIAGNOSIS — J810 Acute pulmonary edema: Secondary | ICD-10-CM

## 2017-12-16 LAB — SURGICAL PCR SCREEN
MRSA, PCR: NEGATIVE
Staphylococcus aureus: NEGATIVE

## 2017-12-16 MED ORDER — FOLIC ACID 1 MG PO TABS
1.0000 mg | ORAL_TABLET | Freq: Every day | ORAL | Status: DC
  Administered 2017-12-16 – 2017-12-18 (×3): 1 mg via ORAL
  Filled 2017-12-16 (×3): qty 1

## 2017-12-16 MED ORDER — DOXYCYCLINE HYCLATE 100 MG PO TABS
100.0000 mg | ORAL_TABLET | Freq: Two times a day (BID) | ORAL | Status: DC
  Administered 2017-12-16 – 2017-12-18 (×5): 100 mg via ORAL
  Filled 2017-12-16 (×5): qty 1

## 2017-12-16 MED ORDER — IPRATROPIUM-ALBUTEROL 0.5-2.5 (3) MG/3ML IN SOLN
3.0000 mL | Freq: Four times a day (QID) | RESPIRATORY_TRACT | Status: DC
  Administered 2017-12-16 – 2017-12-18 (×8): 3 mL via RESPIRATORY_TRACT
  Filled 2017-12-16 (×10): qty 3

## 2017-12-16 MED ORDER — MUPIROCIN 2 % EX OINT
1.0000 "application " | TOPICAL_OINTMENT | Freq: Two times a day (BID) | CUTANEOUS | Status: AC
  Administered 2017-12-16 – 2017-12-17 (×4): 1 via NASAL
  Filled 2017-12-16 (×2): qty 22

## 2017-12-16 MED ORDER — THIAMINE HCL 100 MG/ML IJ SOLN
100.0000 mg | Freq: Every day | INTRAMUSCULAR | Status: DC
  Filled 2017-12-16 (×2): qty 2

## 2017-12-16 MED ORDER — LORAZEPAM 1 MG PO TABS
1.0000 mg | ORAL_TABLET | Freq: Four times a day (QID) | ORAL | Status: DC | PRN
  Administered 2017-12-17: 1 mg via ORAL
  Filled 2017-12-16 (×2): qty 1

## 2017-12-16 MED ORDER — LORAZEPAM 2 MG/ML IJ SOLN
1.0000 mg | Freq: Four times a day (QID) | INTRAMUSCULAR | Status: DC | PRN
  Administered 2017-12-16 – 2017-12-18 (×6): 1 mg via INTRAVENOUS
  Filled 2017-12-16 (×7): qty 1

## 2017-12-16 MED ORDER — BUDESONIDE 0.25 MG/2ML IN SUSP
0.2500 mg | Freq: Two times a day (BID) | RESPIRATORY_TRACT | Status: DC
  Administered 2017-12-16 – 2017-12-18 (×4): 0.25 mg via RESPIRATORY_TRACT
  Filled 2017-12-16 (×5): qty 2

## 2017-12-16 MED ORDER — IBUPROFEN 200 MG PO TABS
200.0000 mg | ORAL_TABLET | ORAL | Status: DC | PRN
  Administered 2017-12-16 – 2017-12-17 (×2): 200 mg via ORAL
  Filled 2017-12-16 (×3): qty 1

## 2017-12-16 MED ORDER — ADULT MULTIVITAMIN W/MINERALS CH
1.0000 | ORAL_TABLET | Freq: Every day | ORAL | Status: DC
  Administered 2017-12-16 – 2017-12-18 (×3): 1 via ORAL
  Filled 2017-12-16 (×3): qty 1

## 2017-12-16 MED ORDER — VITAMIN B-1 100 MG PO TABS
100.0000 mg | ORAL_TABLET | Freq: Every day | ORAL | Status: DC
  Administered 2017-12-16 – 2017-12-18 (×3): 100 mg via ORAL
  Filled 2017-12-16 (×3): qty 1

## 2017-12-16 NOTE — Progress Notes (Signed)
Pt refused to wear BIPAP last night and would not cooperate while attempting to obtain an ABG this morning. Tolerating HFNC([place]) at this time and RT will cont to monitor.

## 2017-12-16 NOTE — Plan of Care (Signed)
Pt transitioned to HF Dorris from bipap and tolerating well, maintaining sats of 95-99% on 8l.  Pt refusing bipap at this time.

## 2017-12-16 NOTE — Consult Note (Addendum)
Brian [name]  ZOX:[id number] DOB: 1956/04/06 DOA: 12/15/2017 PCP: [name], PA-C    LOS: 1 day   Reason for Consult / Chief Complaint:  Acute hypercarbic respiratory failure   Consulting MD and date:  Gherghe   HPI/Summary of hospital stay:  This is a 62 year old male with history of CHF, ETOH, polysubstance abuse, tobacco abuse and copd (no PFTs on chart). Presented to the ED w/ cc shortness of breath.  At baseline only able to tolerate walking short sentences without shortness of breath.  He noted about a week prior to presentation he started to have significant increase in his lower extremity edema extending halfway up his shins.  Because of this he decided to stay at his family's house.  He typically lives in a tent.  He was unable to lay in a bed flat, had to sleep in a chair as laying down made him quite short of breath.  Over the 2 days prior to admit he started to develop some cough this was intermittently productive of yellow to green sputum.  He did have exertional chest pain, this would subside with rest.  Because of the swelling, the persistent and progressive nature of his shortness of breath he decided to come to the emergency room.  O2 sats 84%. Admitted w/ acute on chronic respiratory failure. Treated w/ NIPPV, Lasix systemic steroids, BDs and Doxy. PCCM asked to a/w care 9/8: refusing BIPAP.  On pulmonary arrival he is resting comfortably.  He is now able to lie flat.  His swelling in his lower extremities has resolved.  He is not wheezing.  Subjective:  Is much better  Objective   Blood pressure (Abnormal) 152/92, pulse 87, temperature 98.2 F (36.8 C), temperature source Oral, resp. rate 20, height 6' (1.829 m), weight 84 kg, SpO2 94 %.    FiO2 (%):  [40 %] 40 %   Intake/Output Summary (Last 24 hours) at 12/16/2017 1235 Last data filed at 12/16/2017 0645 Gross per 24 hour  Intake 1480 ml  Output 1500 ml  Net -20 ml   Filed Weights   12/15/17 1411    Weight: 84 kg    Examination: General: Chronically ill 62 year old male disheveled, appears much older than stated age HENT: Normocephalic atraumatic no jugular venous distention his mucous membranes moist Lungs: Scattered rhonchi, improved with cough no accessory use currently Cardiovascular: Regular rate and rhythm without murmur rub or gallop Abdomen: Soft nontender no organomegaly Extremities: Capillary refill warm dry no edema Neuro: Awake oriented no focal deficits GU: Voiding clear yellow urine  Consults: date of consult/date signed off & final recs:   Pulmonary asked to consult on 9/9  Procedures:   Significant Diagnostic Tests: Echocardiogram back in June 2019 demonstrated EF 30 to 35%.  Left ventricle mildly dilated.Also had grade 2 diastolic dysfunction  Micro Data:  Antimicrobials:  Doxycycline started 9/7 Resolved Hospital Problem list    Assessment & Plan:   Acute on chronic hypoxic and hypercarbic respiratory failure in the setting of combined acute decompensated heart failure c/b acute exacerbation of chronic obstructive pulmonary disease. Active tobacco abuse Portable chest x-ray showed mild left basilar volume loss but no marked pulmonary edema there was vascular congestion -favor this being initially heart failure followed by bronchospasm.  He is responded quite favorably to diuresis.  -Clinically improved Plan/rec BiPAP PRN Continue scheduled bronchodilators and inhaled steroids Complete 5 days doxycycline Continue Lasix Not wheezing, got 1 dose of systemic steroid, however  has not received any since admission.  I think you can continue to hold off on this Walking oximetry prior to discharge Smoking cessation Most important will be to ensure he is taking his home medication regimen, also has close outpatient follow-up.  Daily weight, and titration of his cardiac regimen may be able to help limit future hospital admissions  Systolic heart failure  with a EF 30 to 35% Plan Continue aspirin, Coreg, furosemide, and lisinopril. Might consider seeing if you can arrange heart failure clinic follow-up or at least closer outpatient follow-up: The prodrome of this was over a week with fairly classic warning signs of decompensated heart failure  Polysubstance abuse Plan Continue to work towards cessation of smoking, alcohol, and marijuana.  Disposition / Summary of Today's Plan 12/16/17   Clinically better with diuresis.  Think this is primarily heart failure with a possible element of bronchospasm in the setting of his underlying COPD.  He is clinically better but I attribute most of this to his Lasix.  I think he needs closer outpatient follow-up either from primary care or perhaps consider heart failure clinic.  We will sign off  Best Practice / Goals of Care / Disposition.   DVT prophylaxis: Low molecular weight heparin GI prophylaxis: Pantoprazole Diet: Regular Mobility: Increase as tolerated Code Status: Full code Family Communication: Not available  Labs   CBC: Recent Labs  Lab 12/15/17 1432 12/15/17 1800  WBC 7.4 7.5  NEUTROABS 4.7  --   HGB 16.3 16.6  HCT 52.2* 53.9*  MCV 102.0* 103.5*  PLT 202 193   Basic Metabolic Panel: Recent Labs  Lab 12/15/17 1432 12/15/17 1800  NA 143  --   K 4.0  --   CL 104  --   CO2 27  --   GLUCOSE 97  --   BUN 17  --   CREATININE 0.94 0.92  CALCIUM 9.0  --    GFR: Estimated Creatinine Clearance: 94.9 mL/min (by C-G formula based on SCr of 0.92 mg/dL). Recent Labs  Lab 12/15/17 1432 12/15/17 1445 12/15/17 1800  WBC 7.4  --  7.5  LATICACIDVEN  --  1.40  --    Liver Function Tests: No results for input(s): AST, ALT, ALKPHOS, BILITOT, PROT, ALBUMIN in the last 168 hours. No results for input(s): LIPASE, AMYLASE in the last 168 hours. No results for input(s): AMMONIA in the last 168 hours. ABG    Component Value Date/Time   PHART 7.237 (L) 12/15/2017 1614   PCO2ART 67.5  (HH) 12/15/2017 1614   PO2ART 101.0 12/15/2017 1614   HCO3 28.7 (H) 12/15/2017 1614   TCO2 31 12/15/2017 1614   ACIDBASEDEF 1.0 12/15/2017 1614   O2SAT 96.0 12/15/2017 1614    Coagulation Profile: No results for input(s): INR, PROTIME in the last 168 hours. Cardiac Enzymes: No results for input(s): CKTOTAL, CKMB, CKMBINDEX, TROPONINI in the last 168 hours. HbA1C: Hgb A1c MFr Bld  Date/Time Value Ref Range Status  11/03/2017 06:51 PM 5.7 (H) 4.8 - 5.6 % Final    Comment:    (NOTE) Pre diabetes:          5.7%-6.4% Diabetes:              >6.4% Glycemic control for   <7.0% adults with diabetes   10/28/2017 12:04 PM 5.8 (H) 4.8 - 5.6 % Final    Comment:    (NOTE) Pre diabetes:          5.7%-6.4% Diabetes:              >  6.4% Glycemic control for   <7.0% adults with diabetes    CBG: Recent Labs  Lab 12/15/17 2037  GLUCAP 120*     Review of Systems:   General: No current fever chills weight loss or gain HEENT: No headache, sore throat, nasal discharge.  Pulmonary: Short of breath, baseline exertional dyspnea over short distances.  Currently no longer short of breath at rest.  No cough, no wheezing, no chest pain.  Cardiac: Did have exertional chest pain this is since resolved.  No headaches, no lightheadedness, no dizziness.  Did have orthopnea.  Abdomen: No nausea vomiting diarrhea no change in appetite no change in stools GU: No urinary frequency hesitancy burning or dysuria neuro: No headache, memory loss, focal weakness.  Musculoskeletal: No pain, no change in range of motion, no strength abnormalities. Past medical history  He,  has a past medical history of Alcohol abuse, Arthritis, Cardiomyopathy (HCC), CHF (congestive heart failure) (HCC), Cocaine abuse (HCC), COPD (chronic obstructive pulmonary disease) (HCC), Heart failure, Homelessness, Noncompliance, Paroxysmal VT (HCC), Suicide attempt (HCC), and Tobacco abuse.   Surgical History    Past Surgical History:   Procedure Laterality Date  . INNER EAR SURGERY       Social History   Social History   Socioeconomic History  . Marital status: Divorced    Spouse name: Not on file  . Number of children: Not on file  . Years of education: Not on file  . Highest education level: Not on file  Occupational History  . Occupation: unemployed  Social Needs  . Financial resource strain: Very hard  . Food insecurity:    Worry: Sometimes true    Inability: Sometimes true  . Transportation needs:    Medical: Yes    Non-medical: Yes  Tobacco Use  . Smoking status: Current Every Day Smoker    Packs/day: 2.00    Types: Cigarettes  . Smokeless tobacco: Never Used  Substance and Sexual Activity  . Alcohol use: Yes    Comment: Heavy. 5-10 40oz daily  . Drug use: No    Types: Marijuana, Cocaine    Comment: Marijuana and cocaine, crack  . Sexual activity: Not Currently    Birth control/protection: None  Lifestyle  . Physical activity:    Days per week: 0 days    Minutes per session: 0 min  . Stress: Only a little  Relationships  . Social connections:    Talks on phone: More than three times a week    Gets together: Once a week    Attends religious service: Never    Active member of club or organization: Yes    Attends meetings of clubs or organizations: Never    Relationship status: Divorced  . Intimate partner violence:    Fear of current or ex partner: No    Emotionally abused: No    Physically abused: No    Forced sexual activity: No  Other Topics Concern  . Not on file  Social History Narrative   Currently homeless. Has poor social support, no family, just friends and police who check on him. Heavy alcohol noted in the past, currently states he drinks "1-2 beers a day" and last drink was earlier today. States he may get minor shakes and anxiety but denies frank EtOH withdrawal, seizures, DT's. States last cocaine and MJ abuse was last week. Denies IVDU   ,  reports that he has been  smoking cigarettes. He has been smoking about 2.00 packs per day. He  has never used smokeless tobacco. He reports that he drinks alcohol. He reports that he does not use drugs.   Family history   His family history includes Cancer in his sister; Coronary artery disease in his unknown relative; Diabetes in his unknown relative.   Allergies No Known Allergies  Home meds  Prior to Admission medications   Medication Sig Start Date End Date Taking? Authorizing Provider  carvedilol (COREG) 6.25 MG tablet Take 1 tablet (6.25 mg total) by mouth 2 (two) times daily with a meal. 11/08/17 11/03/18 Yes [name], PA-C  folic acid (FOLVITE) 1 MG tablet Take 1 tablet (1 mg total) by mouth daily. 11/08/17 05/07/18 Yes [name], PA-C  furosemide (LASIX) 20 MG tablet Take 1 tablet (20 mg total) by mouth daily. 11/08/17  Yes [name], PA-C  mometasone-formoterol [facility]) 200-5 MCG/ACT AERO Inhale 2 puffs into the lungs 2 (two) times daily. 11/08/17  Yes [name], PA-C  nicotine (NICODERM CQ - DOSED IN MG/24 HOURS) 21 mg/24hr patch Place 1 patch (21 mg total) onto the skin daily. 11/08/17  Yes [name], PA-C  nitroGLYCERIN (NITROSTAT) 0.4 MG SL tablet Place 1 tablet (0.4 mg total) under the tongue every 5 (five) minutes as needed for chest pain. 11/08/17  Yes [name], PA-C  albuterol (PROVENTIL HFA;VENTOLIN HFA) 108 (90 Base) MCG/ACT inhaler Inhale 2 puffs into the lungs every 6 (six) hours as needed for wheezing or shortness of breath. Patient not taking: Reported on 12/15/2017 11/08/17   [name], PA-C  aspirin 81 MG EC tablet Take 1 tablet (81 mg total) by mouth daily. Patient not taking: Reported on 12/15/2017 11/08/17   [name], PA-C  lisinopril (PRINIVIL,ZESTRIL) 5 MG tablet Take 1 tablet (5 mg total) by mouth daily. Patient not taking: Reported on 12/15/2017 11/08/17   [name], PA-C  Omeprazole 20 MG TBEC Take 1 tablet (20 mg total) by mouth  daily. Patient not taking: Reported on 12/15/2017 11/16/17   Zannie Cove, MD  predniSONE (DELTASONE) 20 MG tablet Take 1-2 tablets (20-40 mg total) by mouth daily with breakfast. Take 40mg  for 2days then 20mg  for 2days then STOP Patient not taking: Reported on 12/15/2017 11/16/17   Zannie Cove, MD  traZODone (DESYREL) 50 MG tablet Take 1 tablet (50 mg total) by mouth at bedtime as needed for sleep. Patient not taking: Reported on 12/15/2017 11/16/17   Zannie Cove, MD     Simonne Martinet ACNP-BC [facility] Pulmonary/Critical Care Pager # [phone] OR # [phone] if no answer  Attending Note:  62 year old male with etoh/cocaine history presenting with what appears to be a CHF exacerbation with hypercarbic respiratory failure.  On exam, rhonchi at the bases.  I reviewed CXR with pulmonary edema noted.  Discussed with PCCM-NP.  Acute hypercarbic respiratory failure: improved  - No further need for BiPAP  Hypoxemia:  - Titrate O2 for sat of 88-92%  - May need an ambulatory desat study prior to discharge for ?home O2  Tobacco abuse:  - Smoking cessation  Cocaine and etoh abuse:  - Cessation councelling  Acute pulmonary edema:  - Diureses as ordered  - Strict I/O.  PCCM will sign off, please call back if needed.  Patient seen and examined, agree with above note.  I dictated the care and orders written for this patient under my direction.  Alyson Reedy, MD [phone]

## 2017-12-16 NOTE — Progress Notes (Signed)
PROGRESS NOTE  Brian [name] DOB: 08-Jul-1955 DOA: 12/15/2017 PCP: [name], PA-C   LOS: 1 day   Brief Narrative / Interim history: 62 year old male with history of COPD, chronic combined systolic and diastolic CHF, alcohol abuse, homelessness, polysubstance abuse, who presents to the hospital with shortness of breath.  He was noted to be hypoxic and had significant wheezing, and was admitted to the hospital for COPD exacerbation  Subjective: -Feels a little bit better this morning but still significantly short of breath with ambulation, unable to walk much before needing to stop.  No chest pain, no abdominal pain, nausea or vomiting  Assessment & Plan: Active Problems:   COPD with acute exacerbation (HCC)   COPD exacerbation (HCC)   Acute hypoxic respiratory failure due to COPD exacerbation -Continue nebulizers, Solu-Medrol, and antibiotics due to productive cough -Continue oxygen, wean off to room air if tolerates  Hypertension -Continue Coreg, lisinopril, furosemide  Chronic combined systolic and diastolic CHF -Most recent 2D echo June 2019 showed an EF of 30-35% -He appears euvolemic, continue beta-blockers, ACE inhibitor, diuretic  Tobacco and alcohol use -Placed on CIWA, nicotine patch  Homelessness -Social worker consult    Scheduled Meds: . aspirin EC  81 mg Oral Daily  . budesonide (PULMICORT) nebulizer solution  0.25 mg Nebulization BID  . carvedilol  6.25 mg Oral BID WC  . doxycycline  100 mg Oral Q12H  . enoxaparin (LOVENOX) injection  40 mg Subcutaneous Q24H  . folic acid  1 mg Oral Daily  . furosemide  20 mg Oral Daily  . ipratropium-albuterol  3 mL Nebulization Q6H  . lisinopril  5 mg Oral Daily  . methylPREDNISolone (SOLU-MEDROL) injection  125 mg Intravenous Q6H  . multivitamin with minerals  1 tablet Oral Daily  . mupirocin ointment  1 application Nasal BID  . nicotine  21 mg Transdermal Daily  . pantoprazole  40 mg Oral  Daily  . thiamine  100 mg Oral Daily   Or  . thiamine  100 mg Intravenous Daily   Continuous Infusions: PRN Meds:.ibuprofen, ipratropium-albuterol, LORazepam **OR** LORazepam, traZODone  DVT prophylaxis: Lovenox Code Status: Full code Family Communication: no family at bedside Disposition Plan: d/c when ready   Consultants:   none  Procedures:   none  Antimicrobials:  Doxycycline 9/8 >>   Objective: Vitals:   12/15/17 2300 12/16/17 0458 12/16/17 0812 12/16/17 1128  BP: (!) 118/54 132/80 (!) 154/101 (!) 152/92  Pulse: 80 79 78 87  Resp: 20 20 18 20   Temp: 97.9 F (36.6 C) 98.2 F (36.8 C) 98.4 F (36.9 C) 98.2 F (36.8 C)  TempSrc: Oral Oral Oral Oral  SpO2: 90% 93% 95% 94%  Weight:      Height:        Intake/Output Summary (Last 24 hours) at 12/16/2017 1304 Last data filed at 12/16/2017 0645 Gross per 24 hour  Intake 1480 ml  Output 1500 ml  Net -20 ml   Filed Weights   12/15/17 1411  Weight: 84 kg    Examination:  Constitutional: slightly tachypneic  Eyes: PERRL, lids and conjunctivae normal ENMT: Mucous membranes are moist.  Neck: normal, supple Respiratory: Diffuse bilateral wheezing, no crackles heard.  Slightly increased respiratory effort Cardiovascular: Regular rate and rhythm, no murmurs / rubs / gallops.  Trace LE edema. 2+ pedal pulses. Abdomen: no tenderness. Bowel sounds positive.  Skin: no rashes Neurologic: CN 2-12 grossly intact. Strength 5/5 in all 4.  Psychiatric: Normal judgment and insight.  Alert and oriented x 3. Normal mood.    Data Reviewed: I have independently reviewed following labs and imaging studies   CBC: Recent Labs  Lab 12/15/17 1432 12/15/17 1800  WBC 7.4 7.5  NEUTROABS 4.7  --   HGB 16.3 16.6  HCT 52.2* 53.9*  MCV 102.0* 103.5*  PLT 202 193   Basic Metabolic Panel: Recent Labs  Lab 12/15/17 1432 12/15/17 1800  NA 143  --   K 4.0  --   CL 104  --   CO2 27  --   GLUCOSE 97  --   BUN 17  --     CREATININE 0.94 0.92  CALCIUM 9.0  --    GFR: Estimated Creatinine Clearance: 94.9 mL/min (by C-G formula based on SCr of 0.92 mg/dL). Liver Function Tests: No results for input(s): AST, ALT, ALKPHOS, BILITOT, PROT, ALBUMIN in the last 168 hours. No results for input(s): LIPASE, AMYLASE in the last 168 hours. No results for input(s): AMMONIA in the last 168 hours. Coagulation Profile: No results for input(s): INR, PROTIME in the last 168 hours. Cardiac Enzymes: No results for input(s): CKTOTAL, CKMB, CKMBINDEX, TROPONINI in the last 168 hours. BNP (last 3 results) No results for input(s): PROBNP in the last 8760 hours. HbA1C: No results for input(s): HGBA1C in the last 72 hours. CBG: Recent Labs  Lab 12/15/17 2037  GLUCAP 120*   Lipid Profile: No results for input(s): CHOL, HDL, LDLCALC, TRIG, CHOLHDL, LDLDIRECT in the last 72 hours. Thyroid Function Tests: No results for input(s): TSH, T4TOTAL, FREET4, T3FREE, THYROIDAB in the last 72 hours. Anemia Panel: No results for input(s): VITAMINB12, FOLATE, FERRITIN, TIBC, IRON, RETICCTPCT in the last 72 hours. Urine analysis:    Component Value Date/Time   COLORURINE STRAW (A) 11/03/2017 1324   APPEARANCEUR CLEAR 11/03/2017 1324   APPEARANCEUR Clear 10/10/2012 0320   LABSPEC 1.003 (L) 11/03/2017 1324   LABSPEC 1.009 10/10/2012 0320   PHURINE 5.0 11/03/2017 1324   GLUCOSEU NEGATIVE 11/03/2017 1324   GLUCOSEU Negative 10/10/2012 0320   HGBUR NEGATIVE 11/03/2017 1324   BILIRUBINUR NEGATIVE 11/03/2017 1324   BILIRUBINUR Negative 10/10/2012 0320   KETONESUR NEGATIVE 11/03/2017 1324   PROTEINUR NEGATIVE 11/03/2017 1324   UROBILINOGEN 0.2 08/27/2012 0659   NITRITE NEGATIVE 11/03/2017 1324   LEUKOCYTESUR NEGATIVE 11/03/2017 1324   LEUKOCYTESUR Negative 10/10/2012 0320   Sepsis Labs: Invalid input(s): PROCALCITONIN, LACTICIDVEN  Recent Results (from the past 240 hour(s))  Culture, blood (routine x 2)     Status: None  (Preliminary result)   Collection Time: 12/15/17  2:50 PM  Result Value Ref Range Status   Specimen Description BLOOD LEFT HAND  Final   Special Requests   Final    BOTTLES DRAWN AEROBIC AND ANAEROBIC Blood Culture adequate volume   Culture   Final    NO GROWTH < 24 HOURS Performed at [facility], [address], [place], [place] [address]    Report Status PENDING  Incomplete  Culture, blood (routine x 2)     Status: None (Preliminary result)   Collection Time: 12/15/17  4:07 PM  Result Value Ref Range Status   Specimen Description BLOOD RIGHT HAND  Final   Special Requests   Final    BOTTLES DRAWN AEROBIC AND ANAEROBIC Blood Culture adequate volume   Culture   Final    NO GROWTH < 24 HOURS Performed at [facility], [address] [place], [place] [address]    Report Status PENDING  Incomplete  Surgical PCR screen     Status: None   Collection Time: 12/16/17  8:29 AM  Result Value Ref Range Status   MRSA, PCR NEGATIVE NEGATIVE Final   Staphylococcus aureus NEGATIVE NEGATIVE Final    Comment: (NOTE) The Xpert SA Assay (FDA approved for NASAL specimens in patients 88 years of age and older), is one component of a comprehensive surveillance program. It is not intended to diagnose infection nor to guide or monitor treatment. Performed at [facility], [address], [place], [place] [address]       Radiology Studies: Dg Chest Port 1 View  Result Date: 12/15/2017 CLINICAL DATA:  Shortness of breath. Hypoxia. COPD and congestive heart failure. EXAM: PORTABLE CHEST 1 VIEW COMPARISON:  11/28/2017 FINDINGS: Stable mild cardiomegaly. Aortic atherosclerosis. Both lungs are clear. IMPRESSION: Stable mild cardiomegaly.  No active lung disease. Electronically Signed   By: Myles Rosenthal M.D.   On: 12/15/2017 14:49     Pamella Pert, MD, PhD [facility] Pager [phone]  If 7PM-7AM, please contact night-coverage [email or web address] Password [email or web address] 12/16/2017,  1:04 PM

## 2017-12-17 ENCOUNTER — Inpatient Hospital Stay (HOSPITAL_COMMUNITY): Payer: Medicaid Other

## 2017-12-17 LAB — URINALYSIS, ROUTINE W REFLEX MICROSCOPIC
BILIRUBIN URINE: NEGATIVE
GLUCOSE, UA: NEGATIVE mg/dL
HGB URINE DIPSTICK: NEGATIVE
KETONES UR: NEGATIVE mg/dL
LEUKOCYTES UA: NEGATIVE
Nitrite: NEGATIVE
PH: 6 (ref 5.0–8.0)
Protein, ur: NEGATIVE mg/dL
Specific Gravity, Urine: 1.011 (ref 1.005–1.030)

## 2017-12-17 LAB — HIV ANTIBODY (ROUTINE TESTING W REFLEX): HIV Screen 4th Generation wRfx: NONREACTIVE

## 2017-12-17 MED ORDER — KETOROLAC TROMETHAMINE 15 MG/ML IJ SOLN
15.0000 mg | Freq: Four times a day (QID) | INTRAMUSCULAR | Status: DC | PRN
  Administered 2017-12-17 – 2017-12-18 (×3): 15 mg via INTRAVENOUS
  Filled 2017-12-17 (×3): qty 1

## 2017-12-17 MED ORDER — TRAMADOL HCL 50 MG PO TABS
25.0000 mg | ORAL_TABLET | Freq: Four times a day (QID) | ORAL | Status: DC | PRN
  Administered 2017-12-17: 50 mg via ORAL
  Filled 2017-12-17: qty 1

## 2017-12-17 NOTE — Progress Notes (Addendum)
Nutrition Consult/Brief Note  RD consulted via COPD Focused Protocol.  Wt Readings from Last 15 Encounters:  12/15/17 84 kg  11/28/17 84 kg  11/16/17 84.3 kg  11/08/17 88.9 kg  11/06/17 86.1 kg  10/27/17 83.9 kg  10/16/17 82.7 kg  10/01/17 81.4 kg  09/26/17 88.4 kg  10/20/15 90.9 kg  10/07/15 88 kg  04/28/15 127 kg  08/16/14 129.3 kg  02/02/13 90.7 kg  11/11/12 93.4 kg   Body mass index is 25.12 kg/m. Patient meets criteria for Overweight based on current BMI.   Current diet order is HH/Carbohydrate Modified, patient is consuming approximately 100% of meals at this time. Labs and medications reviewed.   No nutrition interventions warranted at this time. If nutrition issues arise, please consult RD.   Maureen Chatters, RD, LDN Pager #: [phone] After-Hours Pager #: [phone]

## 2017-12-17 NOTE — Progress Notes (Signed)
PROGRESS NOTE  Brian [name] NGE:[id number] DOB: 07/04/1955 DOA: 12/15/2017 PCP: [name], PA-C   LOS: 2 days   Brief Narrative / Interim history: 62 year old male with history of COPD, chronic combined systolic and diastolic CHF, alcohol abuse, homelessness, polysubstance abuse, who presents to the hospital with shortness of breath.  He was noted to be hypoxic and had significant wheezing, and was admitted to the hospital for COPD exacerbation  Subjective: -Complains of left-sided abdominal and flank pain, wrapping around.  No chest pain, still short of breath however feels improved.  Assessment & Plan: Active Problems:   COPD with acute exacerbation (HCC)   COPD exacerbation (HCC)   Acute hypoxic respiratory failure due to COPD exacerbation -Continue nebulizers, Solu-Medrol, and antibiotics due to productive cough -Continue oxygen, wean off to room air if tolerates -Suspect chronic hypoxia in the setting of advanced COPD and ongoing tobacco abuse, will determine home oxygen needs prior to discharge  Left-sided abdominal and flank pain -New, worse with movement, suspect perhaps musculoskeletal in the setting of significant coughing -Given flank pain will rule out renal stone, obtain urinalysis and plain abdominal film  Hypertension -Continue Coreg, lisinopril, furosemide blood pressure slightly on the high side this morning  Chronic combined systolic and diastolic CHF -Most recent 2D echo June 2019 showed an EF of 30-35% -continue beta-blockers, ACE inhibitor, diuretic -Appears euvolemic this morning  Tobacco and alcohol use -Placed on CIWA, nicotine patch  Homelessness -Social worker consult    Scheduled Meds: . aspirin EC  81 mg Oral Daily  . budesonide (PULMICORT) nebulizer solution  0.25 mg Nebulization BID  . carvedilol  6.25 mg Oral BID WC  . doxycycline  100 mg Oral Q12H  . enoxaparin (LOVENOX) injection  40 mg Subcutaneous Q24H  . folic acid  1  mg Oral Daily  . furosemide  20 mg Oral Daily  . ipratropium-albuterol  3 mL Nebulization Q6H  . lisinopril  5 mg Oral Daily  . methylPREDNISolone (SOLU-MEDROL) injection  125 mg Intravenous Q6H  . multivitamin with minerals  1 tablet Oral Daily  . mupirocin ointment  1 application Nasal BID  . nicotine  21 mg Transdermal Daily  . pantoprazole  40 mg Oral Daily  . thiamine  100 mg Oral Daily   Or  . thiamine  100 mg Intravenous Daily   Continuous Infusions: PRN Meds:.ibuprofen, ipratropium-albuterol, LORazepam **OR** LORazepam, traMADol, traZODone  DVT prophylaxis: Lovenox Code Status: Full code Family Communication: no family at bedside Disposition Plan: d/c when ready   Consultants:   none  Procedures:   none  Antimicrobials:  Doxycycline 9/8 >>   Objective: Vitals:   12/17/17 0500 12/17/17 0718 12/17/17 0836 12/17/17 0900  BP:   (!) 166/97   Pulse:   81 77  Resp:   20   Temp:   97.7 F (36.5 C)   TempSrc:   Oral   SpO2: 94% 94% 95%   Weight:      Height:        Intake/Output Summary (Last 24 hours) at 12/17/2017 0943 Last data filed at 12/17/2017 0538 Gross per 24 hour  Intake 240 ml  Output 550 ml  Net -310 ml   Filed Weights   12/15/17 1411  Weight: 84 kg    Examination:  Constitutional: No distress, appears comfortable Eyes: No scleral icterus ENMT: mmm Respiratory: Scattered end expiratory wheezing, no crackles heard.  Normal respiratory effort Cardiovascular: Regular rate and rhythm without significant murmurs.  Trace edema.  Abdomen: Soft, nontender, nondistended, positive bowel sounds Skin: No rashes seen Neurologic: Nonfocal, equal strength Psychiatric: Normal judgment and insight. Alert and oriented x 3. Normal mood.    Data Reviewed: I have independently reviewed following labs and imaging studies   CBC: Recent Labs  Lab 12/15/17 1432 12/15/17 1800  WBC 7.4 7.5  NEUTROABS 4.7  --   HGB 16.3 16.6  HCT 52.2* 53.9*  MCV 102.0*  103.5*  PLT 202 193   Basic Metabolic Panel: Recent Labs  Lab 12/15/17 1432 12/15/17 1800  NA 143  --   K 4.0  --   CL 104  --   CO2 27  --   GLUCOSE 97  --   BUN 17  --   CREATININE 0.94 0.92  CALCIUM 9.0  --    GFR: Estimated Creatinine Clearance: 94.9 mL/min (by C-G formula based on SCr of 0.92 mg/dL). Liver Function Tests: No results for input(s): AST, ALT, ALKPHOS, BILITOT, PROT, ALBUMIN in the last 168 hours. No results for input(s): LIPASE, AMYLASE in the last 168 hours. No results for input(s): AMMONIA in the last 168 hours. Coagulation Profile: No results for input(s): INR, PROTIME in the last 168 hours. Cardiac Enzymes: No results for input(s): CKTOTAL, CKMB, CKMBINDEX, TROPONINI in the last 168 hours. BNP (last 3 results) No results for input(s): PROBNP in the last 8760 hours. HbA1C: No results for input(s): HGBA1C in the last 72 hours. CBG: Recent Labs  Lab 12/15/17 2037  GLUCAP 120*   Lipid Profile: No results for input(s): CHOL, HDL, LDLCALC, TRIG, CHOLHDL, LDLDIRECT in the last 72 hours. Thyroid Function Tests: No results for input(s): TSH, T4TOTAL, FREET4, T3FREE, THYROIDAB in the last 72 hours. Anemia Panel: No results for input(s): VITAMINB12, FOLATE, FERRITIN, TIBC, IRON, RETICCTPCT in the last 72 hours. Urine analysis:    Component Value Date/Time   COLORURINE STRAW (A) 11/03/2017 1324   APPEARANCEUR CLEAR 11/03/2017 1324   APPEARANCEUR Clear 10/10/2012 0320   LABSPEC 1.003 (L) 11/03/2017 1324   LABSPEC 1.009 10/10/2012 0320   PHURINE 5.0 11/03/2017 1324   GLUCOSEU NEGATIVE 11/03/2017 1324   GLUCOSEU Negative 10/10/2012 0320   HGBUR NEGATIVE 11/03/2017 1324   BILIRUBINUR NEGATIVE 11/03/2017 1324   BILIRUBINUR Negative 10/10/2012 0320   KETONESUR NEGATIVE 11/03/2017 1324   PROTEINUR NEGATIVE 11/03/2017 1324   UROBILINOGEN 0.2 08/27/2012 0659   NITRITE NEGATIVE 11/03/2017 1324   LEUKOCYTESUR NEGATIVE 11/03/2017 1324   LEUKOCYTESUR  Negative 10/10/2012 0320   Sepsis Labs: Invalid input(s): PROCALCITONIN, LACTICIDVEN  Recent Results (from the past 240 hour(s))  Culture, blood (routine x 2)     Status: None (Preliminary result)   Collection Time: 12/15/17  2:50 PM  Result Value Ref Range Status   Specimen Description BLOOD LEFT HAND  Final   Special Requests   Final    BOTTLES DRAWN AEROBIC AND ANAEROBIC Blood Culture adequate volume   Culture   Final    NO GROWTH 2 DAYS Performed at [facility], [address] [place], [place] [address]    Report Status PENDING  Incomplete  Culture, blood (routine x 2)     Status: None (Preliminary result)   Collection Time: 12/15/17  4:07 PM  Result Value Ref Range Status   Specimen Description BLOOD RIGHT HAND  Final   Special Requests   Final    BOTTLES DRAWN AEROBIC AND ANAEROBIC Blood Culture adequate volume   Culture   Final    NO GROWTH 2 DAYS Performed at  [facility], [address], [place], [place] [address]    Report Status PENDING  Incomplete  Surgical PCR screen     Status: None   Collection Time: 12/16/17  8:29 AM  Result Value Ref Range Status   MRSA, PCR NEGATIVE NEGATIVE Final   Staphylococcus aureus NEGATIVE NEGATIVE Final    Comment: (NOTE) The Xpert SA Assay (FDA approved for NASAL specimens in patients 77 years of age and older), is one component of a comprehensive surveillance program. It is not intended to diagnose infection nor to guide or monitor treatment. Performed at [facility], [address], [place], [place] [address]       Radiology Studies: Dg Chest Port 1 View  Result Date: 12/15/2017 CLINICAL DATA:  Shortness of breath. Hypoxia. COPD and congestive heart failure. EXAM: PORTABLE CHEST 1 VIEW COMPARISON:  11/28/2017 FINDINGS: Stable mild cardiomegaly. Aortic atherosclerosis. Both lungs are clear. IMPRESSION: Stable mild cardiomegaly.  No active lung disease. Electronically Signed   By: Myles Rosenthal M.D.   On:  12/15/2017 14:49     Pamella Pert, MD, PhD [facility] Pager [phone]  If 7PM-7AM, please contact night-coverage [email or web address] Password [facility] 12/17/2017, 9:43 AM

## 2017-12-17 NOTE — Progress Notes (Signed)
Pt states he is homeless and [place] and has no access to a rescue MDI (proair) upon D/C. This RT suggested he talk with MD to see if they can give him one upon D/C to avoid further hospitalizations.

## 2017-12-17 NOTE — Care Management Note (Addendum)
Case Management Note  Patient Details  Name: Brian [name] MRN: [id number] Date of Birth: [birth year]  Subjective/Objective:  Patient is homeless, [place], presents with copd ex, chf, on 5 liters oxygen, will need to try to wean him off the oxygen prior to dc.  He was having pain today in abd and back, for abd xray today, and urine cx.  On ciwa with ativan.  He has follow up apt at the [facility] clinic on 10/4 at 10:10.  He is able to utilize the [facility] clinic for medication ast.  NCM received consult for copd, he lives in a tent [place] [employer] not able to set [facility] services up w/out a valid address for [facility] agency to go to.  The transition of care pharmacy can assist with medications for patient, will need to do over ride on [employer].  MD will need to let NCM know meds are needed at dc.  Patient left AMA.               Action/Plan: NCM will follow for transition of care needs.   Expected Discharge Date:                  Expected Discharge Plan:  Homeless Shelter  In-House Referral:  Clinical Social Work  Discharge planning Services  CM Consult, [facility], Follow-up appt scheduled, Medication Assistance  Post Acute Care Choice:    Choice offered to:     DME Arranged:    DME Agency:     HH Arranged:    HH Agency:     Status of Service:  Completed, signed off  If discussed at [employer], dates discussed:    Additional Comments:  Leone Haven, RN 12/17/2017, 2:03 PM

## 2017-12-18 LAB — BASIC METABOLIC PANEL
Anion gap: 8 (ref 5–15)
BUN: 20 mg/dL (ref 6–20)
CHLORIDE: 99 mmol/L (ref 98–111)
CO2: 35 mmol/L — AB (ref 22–32)
CREATININE: 0.72 mg/dL (ref 0.61–1.24)
Calcium: 9 mg/dL (ref 8.9–10.3)
GFR calc Af Amer: >60 mL/min (ref >60)
GLUCOSE: 160 mg/dL — AB (ref 70–99)
Potassium: 4.4 mmol/L (ref 3.5–5.1)
Sodium: 142 mmol/L (ref 135–145)

## 2017-12-18 LAB — CBC
HEMATOCRIT: 49.7 % (ref 39.0–52.0)
HEMOGLOBIN: 15.5 g/dL (ref 13.0–17.0)
MCH: 31.6 pg (ref 26.0–34.0)
MCHC: 31.2 g/dL (ref 30.0–36.0)
MCV: 101.2 fL — AB (ref 78.0–100.0)
Platelets: 146 10*3/uL — ABNORMAL LOW (ref 150–400)
RBC: 4.91 MIL/uL (ref 4.22–5.81)
RDW: 13.4 % (ref 11.5–15.5)
WBC: 10.2 10*3/uL (ref 4.0–10.5)

## 2017-12-18 MED ORDER — CARVEDILOL 6.25 MG PO TABS: 6.2500 mg | ORAL_TABLET | Freq: Two times a day (BID) | ORAL | 0 refills | Status: DC

## 2017-12-18 MED ORDER — DOXYCYCLINE HYCLATE 100 MG PO TABS: 100.0000 mg | ORAL_TABLET | Freq: Two times a day (BID) | ORAL | 0 refills | Status: DC

## 2017-12-18 MED ORDER — FUROSEMIDE 20 MG PO TABS: 20.0000 mg | ORAL_TABLET | Freq: Every day | ORAL | 0 refills | Status: DC

## 2017-12-18 MED ORDER — ALBUTEROL SULFATE HFA 108 (90 BASE) MCG/ACT IN AERS: 2.0000 | INHALATION_SPRAY | Freq: Four times a day (QID) | RESPIRATORY_TRACT | 2 refills | Status: DC | PRN

## 2017-12-18 MED ORDER — FUROSEMIDE 20 MG PO TABS: 20.0000 mg | ORAL_TABLET | Freq: Every day | ORAL | Status: DC

## 2017-12-18 MED ORDER — PREDNISONE 20 MG PO TABS: 40.0000 mg | ORAL_TABLET | Freq: Every day | ORAL | 0 refills | Status: DC

## 2017-12-18 MED ORDER — FUROSEMIDE 10 MG/ML IJ SOLN
40.0000 mg | Freq: Once | INTRAMUSCULAR | Status: AC
  Administered 2017-12-18: 40 mg via INTRAVENOUS
  Filled 2017-12-18: qty 4

## 2017-12-18 MED ORDER — MOMETASONE FURO-FORMOTEROL FUM 200-5 MCG/ACT IN AERO: 2.0000 | INHALATION_SPRAY | Freq: Two times a day (BID) | RESPIRATORY_TRACT | 2 refills | Status: DC

## 2017-12-18 NOTE — Discharge Summary (Signed)
Physician AGAINST MEDICAL ADVICE discharge Summary  Brian [name] GLO:[id number] DOB: 22-Dec-1955 DOA: 12/15/2017  PCP: [name], PA-C  Admit date: 12/15/2017 Discharge date: 12/18/2017  Admitted From: Patient is homeless, lives in a tent Disposition: Left AGAINST MEDICAL ADVICE  Recommendations for Outpatient Follow-up:  1. Follow up with PCP ASAP   Discharge Condition: Guarded CODE STATUS: Full code Diet recommendation: Regular  HPI: Per Dr. Barrie Folk, Brian [name] is a 62 y.o. male with medical history significant for COPD, congestive heart failure cardiomegaly cardiomyopathy alcohol abuse with multiple substance abuse including cocaine tobacco abuse, who presented to the emergency room department today by EMS for shortness of breath EMS noted his oxygen to be 84 when he was assessed so they put him on CPAP and gave him nitroglycerin patient denies any chest pain no fever no cough  Hospital Course: Patient was admitted with acute hypoxic respiratory failure in the setting of COPD exacerbation, for full details please see today's detailed progress note, he was improving but persistently hypoxic on 4 L nasal cannula, however today patient decided to refuse further medical treatment. Patient at this time expresses desire to leave the Hospital immidiately, patient has been warned that this is not Medically advisable at this time, and can result in Medical complications like Death and Disability, patient understands and accepts the risks involved and assumes full responsibilty of this decision.  I personally discussed with patient and recommended ongoing inpatient hospital care however he refused.  Discharge Diagnoses:  Active Problems:   COPD with acute exacerbation (HCC)   COPD exacerbation (HCC)   Discharge Instructions   Allergies as of 12/18/2017   No Known Allergies   Med Rec must be completed prior to using this [facility]      Follow-up Information    [facility] Follow up on 01/11/2018.   Why:  10:10 for hospital follow up Contact information: [address] [place] [id number] [phone]       [facility] Follow up.   Why:  utilize this pharmacy for medication ast. Contact information: [address] [place] [address] [phone]          Consultations:  Procedures/Studies:  Dg Chest 2 View  Result Date: 11/28/2017 CLINICAL DATA:  Chest pain and shortness of breath EXAM: CHEST - 2 VIEW COMPARISON:  November 26, 2017 FINDINGS: There is mild left base atelectasis. There is no edema or consolidation. Heart is mildly enlarged with pulmonary vascularity normal. No adenopathy. No appreciable bone lesions. IMPRESSION: Mild left base atelectasis. No edema or consolidation. Stable cardiac prominence. Electronically Signed   By: Bretta Bang III M.D.   On: 11/28/2017 20:30   Dg Chest 2 View  Result Date: 11/26/2017 CLINICAL DATA:  Short of breath EXAM: CHEST - 2 VIEW COMPARISON:  11/12/2017, 11/03/2017 FINDINGS: Subsegmental atelectasis at the bases. Mild cardiomegaly. No pleural effusion. Vascularity within normal limits. No pneumothorax. IMPRESSION: Cardiomegaly.  Subsegmental atelectasis at both bases Electronically Signed   By: Jasmine Pang M.D.   On: 11/26/2017 23:15   Dg Abd 1 View  Result Date: 12/17/2017 CLINICAL DATA:  62 year old male with generalized abdominal pain. Initial encounter. EXAM: ABDOMEN - 1 VIEW COMPARISON:  No comparison abdominal films. Comparison chest x-ray 12/15/2017. FINDINGS: Moderate stool throughout the colon. No evidence of small-bowel obstruction. The possibility of free intraperitoneal air cannot be assessed on a supine view. IMPRESSION: Moderate stool throughout the colon.  Electronically Signed   By: Lacy Duverney M.D.   On: 12/17/2017 19:05   Dg Chest Port 1 View  Result Date:  12/15/2017 CLINICAL DATA:  Shortness of breath. Hypoxia. COPD and congestive heart failure. EXAM: PORTABLE CHEST 1 VIEW COMPARISON:  11/28/2017 FINDINGS: Stable mild cardiomegaly. Aortic atherosclerosis. Both lungs are clear. IMPRESSION: Stable mild cardiomegaly.  No active lung disease. Electronically Signed   By: Myles Rosenthal M.D.   On: 12/15/2017 14:49     Discharge Exam: Vitals:   12/18/17 0733 12/18/17 0814  BP:  (!) 156/97  Pulse:  72  Resp:    Temp:  98 F (36.7 C)  SpO2: 96% 97%    The results of significant diagnostics from this hospitalization (including imaging, microbiology, ancillary and laboratory) are listed below for reference.     Microbiology: Recent Results (from the past 240 hour(s))  Culture, blood (routine x 2)     Status: None (Preliminary result)   Collection Time: 12/15/17  2:50 PM  Result Value Ref Range Status   Specimen Description BLOOD LEFT HAND  Final   Special Requests   Final    BOTTLES DRAWN AEROBIC AND ANAEROBIC Blood Culture adequate volume   Culture   Final    NO GROWTH 3 DAYS Performed at [facility], [address], [place], [place] [address]    Report Status PENDING  Incomplete  Culture, blood (routine x 2)     Status: None (Preliminary result)   Collection Time: 12/15/17  4:07 PM  Result Value Ref Range Status   Specimen Description BLOOD RIGHT HAND  Final   Special Requests   Final    BOTTLES DRAWN AEROBIC AND ANAEROBIC Blood Culture adequate volume   Culture   Final    NO GROWTH 3 DAYS Performed at [facility], [address], [place], [place] [address]    Report Status PENDING  Incomplete  Surgical PCR screen     Status: None   Collection Time: 12/16/17  8:29 AM  Result Value Ref Range Status   MRSA, PCR NEGATIVE NEGATIVE Final   Staphylococcus aureus NEGATIVE NEGATIVE Final    Comment: (NOTE) The Xpert SA Assay (FDA approved for NASAL specimens in patients 15 years of age and older), is one component of a  comprehensive surveillance program. It is not intended to diagnose infection nor to guide or monitor treatment. Performed at [facility], [address] [place], [place] [address]      Labs: BNP (last 3 results) Recent Labs    10/26/17 1920 11/03/17 1302 12/15/17 1442  BNP 258.0* 761.8* 252.3*   Basic Metabolic Panel: Recent Labs  Lab 12/15/17 1432 12/15/17 1800 12/18/17 0449  NA 143  --  142  K 4.0  --  4.4  CL 104  --  99  CO2 27  --  35*  GLUCOSE 97  --  160*  BUN 17  --  20  CREATININE 0.94 0.92 0.72  CALCIUM 9.0  --  9.0   Liver Function Tests: No results for input(s): AST, ALT, ALKPHOS, BILITOT, PROT, ALBUMIN in the last 168 hours. No results for input(s): LIPASE, AMYLASE in the last 168 hours. No results for input(s): AMMONIA in the last 168 hours. CBC: Recent Labs  Lab 12/15/17 1432 12/15/17 1800 12/18/17 0449  WBC 7.4 7.5 10.2  NEUTROABS 4.7  --   --   HGB 16.3 16.6 15.5  HCT 52.2* 53.9* 49.7  MCV 102.0* 103.5*  101.2*  PLT 202 193 146*   Cardiac Enzymes: No results for input(s): CKTOTAL, CKMB, CKMBINDEX, TROPONINI in the last 168 hours. BNP: Invalid input(s): POCBNP CBG: Recent Labs  Lab 12/15/17 2037  GLUCAP 120*   D-Dimer No results for input(s): DDIMER in the last 72 hours. Hgb A1c No results for input(s): HGBA1C in the last 72 hours. Lipid Profile No results for input(s): CHOL, HDL, LDLCALC, TRIG, CHOLHDL, LDLDIRECT in the last 72 hours. Thyroid function studies No results for input(s): TSH, T4TOTAL, T3FREE, THYROIDAB in the last 72 hours.  Invalid input(s): FREET3 Anemia work up No results for input(s): VITAMINB12, FOLATE, FERRITIN, TIBC, IRON, RETICCTPCT in the last 72 hours. Urinalysis    Component Value Date/Time   COLORURINE YELLOW 12/17/2017 1845   APPEARANCEUR CLEAR 12/17/2017 1845   APPEARANCEUR Clear 10/10/2012 0320   LABSPEC 1.011 12/17/2017 1845   LABSPEC 1.009 10/10/2012 0320   PHURINE 6.0 12/17/2017 1845    GLUCOSEU NEGATIVE 12/17/2017 1845   GLUCOSEU Negative 10/10/2012 0320   HGBUR NEGATIVE 12/17/2017 1845   BILIRUBINUR NEGATIVE 12/17/2017 1845   BILIRUBINUR Negative 10/10/2012 0320   KETONESUR NEGATIVE 12/17/2017 1845   PROTEINUR NEGATIVE 12/17/2017 1845   UROBILINOGEN 0.2 08/27/2012 0659   NITRITE NEGATIVE 12/17/2017 1845   LEUKOCYTESUR NEGATIVE 12/17/2017 1845   LEUKOCYTESUR Negative 10/10/2012 0320   Sepsis Labs Invalid input(s): PROCALCITONIN,  WBC,  LACTICIDVEN   SIGNED:  Pamella Pert, MD  [facility] 12/18/2017, 2:46 PM Pager [phone]  If 7PM-7AM, please contact night-coverage [email or web address] Password [email or web address]

## 2017-12-18 NOTE — Progress Notes (Signed)
Pt leaving against medical advice.  MD notified and spoke to pt.  Understands risks of leaving AMA.  IV removed.  Prescriptions given to pt.  Pt knife returned to pt by security.  Home meds received from pharmacy and given to pt.  Bus pass given per Child psychotherapist.  Escorted with all belongings to door via wheelchair.

## 2017-12-18 NOTE — Progress Notes (Signed)
PROGRESS NOTE  Brian [name] XYB:[id number] DOB: 01/26/1956 DOA: 12/15/2017 PCP: [name], PA-C   LOS: 3 days   Brief Narrative / Interim history: 62 year old male with history of COPD, chronic combined systolic and diastolic CHF, alcohol abuse, homelessness, polysubstance abuse, who presents to the hospital with shortness of breath.  He was noted to be hypoxic and had significant wheezing, and was admitted to the hospital for COPD exacerbation  Subjective: -Abdominal pain and flank pain slightly better this morning, slept well overnight.  Breathing is improved but still hypoxic on 4 L.  Severely dyspneic with ambulation.  Assessment & Plan: Active Problems:   COPD with acute exacerbation (HCC)   COPD exacerbation (HCC)   Acute hypoxic respiratory failure due to COPD exacerbation -Continue nebulizers, Solu-Medrol, and antibiotics due to productive cough -Continue oxygen, wean off to room air today if tolerates -Suspect chronic hypoxia in the setting of advanced COPD and ongoing tobacco abuse, current social issues with the patient living in a tent makes home oxygen quite difficult.  Left-sided abdominal and flank pain -New, worse with movement, suspect perhaps musculoskeletal in the setting of significant coughing -Urinalysis without evidence of infection or hematuria suggesting nephrolithiasis, plain abdominal x-ray without acute findings -Improved with Toradol, continue.  Avoid narcotics  Hypertension -Continue Coreg, lisinopril, furosemide, blood pressure in the 140s 150s  Chronic combined systolic and diastolic CHF -Most recent 2D echo June 2019 showed an EF of 30-35% -continue beta-blockers, ACE inhibitor, diuretic -Appears euvolemic this morning, however will give IV Lasix x1  Tobacco and alcohol use -Placed on CIWA, nicotine patch  Homelessness -Social worker consult    Scheduled Meds: . aspirin EC  81 mg Oral Daily  . budesonide (PULMICORT) nebulizer  solution  0.25 mg Nebulization BID  . carvedilol  6.25 mg Oral BID WC  . doxycycline  100 mg Oral Q12H  . enoxaparin (LOVENOX) injection  40 mg Subcutaneous Q24H  . folic acid  1 mg Oral Daily  . [START ON 12/19/2017] furosemide  20 mg Oral Daily  . ipratropium-albuterol  3 mL Nebulization Q6H  . lisinopril  5 mg Oral Daily  . methylPREDNISolone (SOLU-MEDROL) injection  125 mg Intravenous Q6H  . multivitamin with minerals  1 tablet Oral Daily  . nicotine  21 mg Transdermal Daily  . pantoprazole  40 mg Oral Daily  . thiamine  100 mg Oral Daily   Or  . thiamine  100 mg Intravenous Daily   Continuous Infusions: PRN Meds:.ipratropium-albuterol, ketorolac, LORazepam **OR** LORazepam, traMADol, traZODone  DVT prophylaxis: Lovenox Code Status: Full code Family Communication: no family at bedside Disposition Plan: d/c when ready   Consultants:   none  Procedures:   none  Antimicrobials:  Doxycycline 9/8 >>   Objective: Vitals:   12/17/17 2300 12/18/17 0128 12/18/17 0733 12/18/17 0814  BP: (!) 148/82   (!) 156/97  Pulse: 78 80  72  Resp: 20 20    Temp: 97.9 F (36.6 C)   98 F (36.7 C)  TempSrc: Oral   Oral  SpO2: 94%  96% 97%  Weight:      Height:        Intake/Output Summary (Last 24 hours) at 12/18/2017 1001 Last data filed at 12/17/2017 1700 Gross per 24 hour  Intake 480 ml  Output -  Net 480 ml   Filed Weights   12/15/17 1411  Weight: 84 kg    Examination:  Constitutional: NAD Eyes: no icterus  ENMT: mmm Respiratory: few end  expiratory wheezing, improved, overall decreased breath sounds. Faint crackles. Good air movement  Cardiovascular: rrr without murmurs. Trace le edema  Abdomen: soft, NT, ND, BS+ Skin: no rashes Neurologic: non focal    Data Reviewed: I have independently reviewed following labs and imaging studies   CBC: Recent Labs  Lab 12/15/17 1432 12/15/17 1800 12/18/17 0449  WBC 7.4 7.5 10.2  NEUTROABS 4.7  --   --   HGB 16.3 16.6  15.5  HCT 52.2* 53.9* 49.7  MCV 102.0* 103.5* 101.2*  PLT 202 193 146*   Basic Metabolic Panel: Recent Labs  Lab 12/15/17 1432 12/15/17 1800 12/18/17 0449  NA 143  --  142  K 4.0  --  4.4  CL 104  --  99  CO2 27  --  35*  GLUCOSE 97  --  160*  BUN 17  --  20  CREATININE 0.94 0.92 0.72  CALCIUM 9.0  --  9.0   GFR: Estimated Creatinine Clearance: 109.1 mL/min (by C-G formula based on SCr of 0.72 mg/dL). Liver Function Tests: No results for input(s): AST, ALT, ALKPHOS, BILITOT, PROT, ALBUMIN in the last 168 hours. No results for input(s): LIPASE, AMYLASE in the last 168 hours. No results for input(s): AMMONIA in the last 168 hours. Coagulation Profile: No results for input(s): INR, PROTIME in the last 168 hours. Cardiac Enzymes: No results for input(s): CKTOTAL, CKMB, CKMBINDEX, TROPONINI in the last 168 hours. BNP (last 3 results) No results for input(s): PROBNP in the last 8760 hours. HbA1C: No results for input(s): HGBA1C in the last 72 hours. CBG: Recent Labs  Lab 12/15/17 2037  GLUCAP 120*   Lipid Profile: No results for input(s): CHOL, HDL, LDLCALC, TRIG, CHOLHDL, LDLDIRECT in the last 72 hours. Thyroid Function Tests: No results for input(s): TSH, T4TOTAL, FREET4, T3FREE, THYROIDAB in the last 72 hours. Anemia Panel: No results for input(s): VITAMINB12, FOLATE, FERRITIN, TIBC, IRON, RETICCTPCT in the last 72 hours. Urine analysis:    Component Value Date/Time   COLORURINE YELLOW 12/17/2017 1845   APPEARANCEUR CLEAR 12/17/2017 1845   APPEARANCEUR Clear 10/10/2012 0320   LABSPEC 1.011 12/17/2017 1845   LABSPEC 1.009 10/10/2012 0320   PHURINE 6.0 12/17/2017 1845   GLUCOSEU NEGATIVE 12/17/2017 1845   GLUCOSEU Negative 10/10/2012 0320   HGBUR NEGATIVE 12/17/2017 1845   BILIRUBINUR NEGATIVE 12/17/2017 1845   BILIRUBINUR Negative 10/10/2012 0320   KETONESUR NEGATIVE 12/17/2017 1845   PROTEINUR NEGATIVE 12/17/2017 1845   UROBILINOGEN 0.2 08/27/2012 0659    NITRITE NEGATIVE 12/17/2017 1845   LEUKOCYTESUR NEGATIVE 12/17/2017 1845   LEUKOCYTESUR Negative 10/10/2012 0320   Sepsis Labs: Invalid input(s): PROCALCITONIN, LACTICIDVEN  Recent Results (from the past 240 hour(s))  Culture, blood (routine x 2)     Status: None (Preliminary result)   Collection Time: 12/15/17  2:50 PM  Result Value Ref Range Status   Specimen Description BLOOD LEFT HAND  Final   Special Requests   Final    BOTTLES DRAWN AEROBIC AND ANAEROBIC Blood Culture adequate volume   Culture   Final    NO GROWTH 3 DAYS Performed at [facility], [address] [place], [place] [address]    Report Status PENDING  Incomplete  Culture, blood (routine x 2)     Status: None (Preliminary result)   Collection Time: 12/15/17  4:07 PM  Result Value Ref Range Status   Specimen Description BLOOD RIGHT HAND  Final   Special Requests   Final    BOTTLES  DRAWN AEROBIC AND ANAEROBIC Blood Culture adequate volume   Culture   Final    NO GROWTH 3 DAYS Performed at [facility], [address], [place], [place] [address]    Report Status PENDING  Incomplete  Surgical PCR screen     Status: None   Collection Time: 12/16/17  8:29 AM  Result Value Ref Range Status   MRSA, PCR NEGATIVE NEGATIVE Final   Staphylococcus aureus NEGATIVE NEGATIVE Final    Comment: (NOTE) The Xpert SA Assay (FDA approved for NASAL specimens in patients 15 years of age and older), is one component of a comprehensive surveillance program. It is not intended to diagnose infection nor to guide or monitor treatment. Performed at [facility], [address], [place], [place] [address]       Radiology Studies: Dg Abd 1 View  Result Date: 12/17/2017 CLINICAL DATA:  62 year old male with generalized abdominal pain. Initial encounter. EXAM: ABDOMEN - 1 VIEW COMPARISON:  No comparison abdominal films. Comparison chest x-ray 12/15/2017. FINDINGS: Moderate stool throughout the colon. No evidence of  small-bowel obstruction. The possibility of free intraperitoneal air cannot be assessed on a supine view. IMPRESSION: Moderate stool throughout the colon. Electronically Signed   By: Lacy Duverney M.D.   On: 12/17/2017 19:05     Pamella Pert, MD, PhD [facility] Pager [phone]  If 7PM-7AM, please contact night-coverage [email or web address] Password [email or web address] 12/18/2017, 10:01 AM

## 2017-12-20 LAB — CULTURE, BLOOD (ROUTINE X 2)
Culture: NO GROWTH
Culture: NO GROWTH
SPECIAL REQUESTS: ADEQUATE
SPECIAL REQUESTS: ADEQUATE

## 2017-12-25 ENCOUNTER — Other Ambulatory Visit: Payer: Self-pay

## 2017-12-25 ENCOUNTER — Emergency Department
Admission: EM | Admit: 2017-12-25 | Discharge: 2017-12-25 | Disposition: A | Discharge status: Home / Self Care | Payer: Medicaid Other | Attending: Emergency Medicine | Admitting: Emergency Medicine

## 2017-12-25 DIAGNOSIS — R1032 Left lower quadrant pain: Secondary | ICD-10-CM | POA: Diagnosis not present

## 2017-12-25 DIAGNOSIS — Z5321 Procedure and treatment not carried out due to patient leaving prior to being seen by health care provider: Secondary | ICD-10-CM | POA: Insufficient documentation

## 2017-12-25 LAB — URINALYSIS, COMPLETE (UACMP) WITH MICROSCOPIC
BILIRUBIN URINE: NEGATIVE
Bacteria, UA: NONE SEEN
GLUCOSE, UA: NEGATIVE mg/dL
Hgb urine dipstick: NEGATIVE
KETONES UR: NEGATIVE mg/dL
LEUKOCYTES UA: NEGATIVE
Nitrite: NEGATIVE
PH: 5 (ref 5.0–8.0)
PROTEIN: NEGATIVE mg/dL
SQUAMOUS EPITHELIAL / LPF: NONE SEEN (ref 0–5)
Specific Gravity, Urine: 1.013 (ref 1.005–1.030)

## 2017-12-25 LAB — CBC
HCT: 49 % (ref 40.0–52.0)
HEMOGLOBIN: 16.7 g/dL (ref 13.0–18.0)
MCH: 32.8 pg (ref 26.0–34.0)
MCHC: 34.1 g/dL (ref 32.0–36.0)
MCV: 96.2 fL (ref 80.0–100.0)
Platelets: 204 10*3/uL (ref 150–440)
RBC: 5.1 MIL/uL (ref 4.40–5.90)
RDW: 14.6 % — ABNORMAL HIGH (ref 11.5–14.5)
WBC: 10.5 10*3/uL (ref 3.8–10.6)

## 2017-12-25 LAB — COMPREHENSIVE METABOLIC PANEL
ALT: 40 U/L (ref 0–44)
ANION GAP: 12 (ref 5–15)
AST: 24 U/L (ref 15–41)
Albumin: 3.6 g/dL (ref 3.5–5.0)
Alkaline Phosphatase: 53 U/L (ref 38–126)
BUN: 16 mg/dL (ref 6–20)
CO2: 30 mmol/L (ref 22–32)
Calcium: 10.3 mg/dL (ref 8.9–10.3)
Chloride: 102 mmol/L (ref 98–111)
Creatinine, Ser: 0.68 mg/dL (ref 0.61–1.24)
GFR calc Af Amer: >60 mL/min (ref >60)
Glucose, Bld: 102 mg/dL — ABNORMAL HIGH (ref 70–99)
Potassium: 4.5 mmol/L (ref 3.5–5.1)
Sodium: 144 mmol/L (ref 135–145)
Total Bilirubin: 0.3 mg/dL (ref 0.3–1.2)
Total Protein: 6.9 g/dL (ref 6.5–8.1)

## 2017-12-25 LAB — LIPASE, BLOOD: Lipase: 33 U/L (ref 11–51)

## 2017-12-25 NOTE — ED Provider Notes (Signed)
When called back to treatment room, pt found to have  LWBS   Sharman Cheek, MD 12/25/17 2305

## 2017-12-25 NOTE — ED Triage Notes (Addendum)
intermittent left sided, lower abdominal pain for past week. Denies NVD. Pt alert and oriented X4, active, cooperative, pt in NAD. RR even and unlabored, color WNL.

## 2017-12-25 NOTE — ED Notes (Signed)
Pt called no answer 

## 2017-12-26 ENCOUNTER — Telehealth: Payer: Self-pay | Admitting: Emergency Medicine

## 2017-12-26 NOTE — Telephone Encounter (Signed)
Called patient due to lwot to inquire about condition and follow up plans. Person was unable to hear me.

## 2017-12-31 ENCOUNTER — Emergency Department: Payer: Medicaid Other

## 2017-12-31 ENCOUNTER — Other Ambulatory Visit: Payer: Self-pay

## 2017-12-31 ENCOUNTER — Inpatient Hospital Stay
Admission: EM | Admit: 2017-12-31 | Discharge: 2018-01-03 | DRG: 191 | Disposition: A | Discharge status: Home / Self Care | Payer: Medicaid Other | Attending: Internal Medicine | Admitting: Internal Medicine

## 2017-12-31 ENCOUNTER — Encounter: Payer: Self-pay | Admitting: Emergency Medicine

## 2017-12-31 DIAGNOSIS — Y908 Blood alcohol level of 240 mg/100 ml or more: Secondary | ICD-10-CM | POA: Diagnosis present

## 2017-12-31 DIAGNOSIS — Z7951 Long term (current) use of inhaled steroids: Secondary | ICD-10-CM

## 2017-12-31 DIAGNOSIS — I2510 Atherosclerotic heart disease of native coronary artery without angina pectoris: Secondary | ICD-10-CM | POA: Diagnosis present

## 2017-12-31 DIAGNOSIS — K5730 Diverticulosis of large intestine without perforation or abscess without bleeding: Secondary | ICD-10-CM | POA: Diagnosis present

## 2017-12-31 DIAGNOSIS — I110 Hypertensive heart disease with heart failure: Secondary | ICD-10-CM | POA: Diagnosis present

## 2017-12-31 DIAGNOSIS — F10120 Alcohol abuse with intoxication, uncomplicated: Secondary | ICD-10-CM | POA: Diagnosis present

## 2017-12-31 DIAGNOSIS — Y9389 Activity, other specified: Secondary | ICD-10-CM

## 2017-12-31 DIAGNOSIS — K4090 Unilateral inguinal hernia, without obstruction or gangrene, not specified as recurrent: Secondary | ICD-10-CM | POA: Diagnosis present

## 2017-12-31 DIAGNOSIS — F10920 Alcohol use, unspecified with intoxication, uncomplicated: Secondary | ICD-10-CM

## 2017-12-31 DIAGNOSIS — I5020 Unspecified systolic (congestive) heart failure: Secondary | ICD-10-CM | POA: Diagnosis present

## 2017-12-31 DIAGNOSIS — Z590 Homelessness: Secondary | ICD-10-CM

## 2017-12-31 DIAGNOSIS — Z7982 Long term (current) use of aspirin: Secondary | ICD-10-CM

## 2017-12-31 DIAGNOSIS — Z915 Personal history of self-harm: Secondary | ICD-10-CM

## 2017-12-31 DIAGNOSIS — S27329A Contusion of lung, unspecified, initial encounter: Secondary | ICD-10-CM | POA: Diagnosis present

## 2017-12-31 DIAGNOSIS — W1781XA Fall down embankment (hill), initial encounter: Secondary | ICD-10-CM

## 2017-12-31 DIAGNOSIS — R000 Tachycardia, unspecified: Secondary | ICD-10-CM | POA: Diagnosis present

## 2017-12-31 DIAGNOSIS — S1191XA Laceration without foreign body of unspecified part of neck, initial encounter: Secondary | ICD-10-CM

## 2017-12-31 DIAGNOSIS — Z23 Encounter for immunization: Secondary | ICD-10-CM

## 2017-12-31 DIAGNOSIS — F17210 Nicotine dependence, cigarettes, uncomplicated: Secondary | ICD-10-CM | POA: Diagnosis present

## 2017-12-31 DIAGNOSIS — I429 Cardiomyopathy, unspecified: Secondary | ICD-10-CM | POA: Diagnosis present

## 2017-12-31 DIAGNOSIS — S41112A Laceration without foreign body of left upper arm, initial encounter: Secondary | ICD-10-CM

## 2017-12-31 DIAGNOSIS — Z79899 Other long term (current) drug therapy: Secondary | ICD-10-CM

## 2017-12-31 DIAGNOSIS — M25561 Pain in right knee: Secondary | ICD-10-CM | POA: Diagnosis present

## 2017-12-31 DIAGNOSIS — S51842A Puncture wound with foreign body of left forearm, initial encounter: Secondary | ICD-10-CM | POA: Diagnosis present

## 2017-12-31 DIAGNOSIS — R52 Pain, unspecified: Secondary | ICD-10-CM

## 2017-12-31 DIAGNOSIS — I700 Atherosclerosis of aorta: Secondary | ICD-10-CM | POA: Diagnosis present

## 2017-12-31 DIAGNOSIS — Z716 Tobacco abuse counseling: Secondary | ICD-10-CM

## 2017-12-31 DIAGNOSIS — L559 Sunburn, unspecified: Secondary | ICD-10-CM | POA: Diagnosis present

## 2017-12-31 DIAGNOSIS — S1181XA Laceration without foreign body of other specified part of neck, initial encounter: Secondary | ICD-10-CM | POA: Diagnosis present

## 2017-12-31 DIAGNOSIS — J441 Chronic obstructive pulmonary disease with (acute) exacerbation: Principal | ICD-10-CM | POA: Diagnosis present

## 2017-12-31 DIAGNOSIS — Y9289 Other specified places as the place of occurrence of the external cause: Secondary | ICD-10-CM

## 2017-12-31 LAB — BASIC METABOLIC PANEL
Anion gap: 6 (ref 5–15)
BUN: 13 mg/dL (ref 6–20)
CO2: 29 mmol/L (ref 22–32)
CREATININE: 0.7 mg/dL (ref 0.61–1.24)
Calcium: 8.7 mg/dL — ABNORMAL LOW (ref 8.9–10.3)
Chloride: 103 mmol/L (ref 98–111)
Glucose, Bld: 86 mg/dL (ref 70–99)
Potassium: 4 mmol/L (ref 3.5–5.1)
SODIUM: 138 mmol/L (ref 135–145)

## 2017-12-31 LAB — TROPONIN I
TROPONIN I: 0.03 ng/mL — AB (ref <0.03)
TROPONIN I: 0.04 ng/mL — AB (ref <0.03)
TROPONIN I: 0.04 ng/mL — AB (ref <0.03)

## 2017-12-31 LAB — ETHANOL
ALCOHOL ETHYL (B): 244 mg/dL — AB (ref <10)
Alcohol, Ethyl (B): 116 mg/dL — ABNORMAL HIGH (ref <10)

## 2017-12-31 LAB — CBC
HCT: 48.9 % (ref 40.0–52.0)
Hemoglobin: 16.4 g/dL (ref 13.0–18.0)
MCH: 33.1 pg (ref 26.0–34.0)
MCHC: 33.5 g/dL (ref 32.0–36.0)
MCV: 98.7 fL (ref 80.0–100.0)
PLATELETS: 204 10*3/uL (ref 150–440)
RBC: 4.96 MIL/uL (ref 4.40–5.90)
RDW: 15 % — ABNORMAL HIGH (ref 11.5–14.5)
WBC: 8.2 10*3/uL (ref 3.8–10.6)

## 2017-12-31 MED ORDER — IPRATROPIUM-ALBUTEROL 0.5-2.5 (3) MG/3ML IN SOLN
3.0000 mL | Freq: Once | RESPIRATORY_TRACT | Status: AC
  Administered 2017-12-31: 3 mL via RESPIRATORY_TRACT
  Filled 2017-12-31: qty 3

## 2017-12-31 MED ORDER — THIAMINE HCL 100 MG/ML IJ SOLN
Freq: Once | INTRAVENOUS | Status: AC
  Administered 2017-12-31: 22:00:00 via INTRAVENOUS
  Filled 2017-12-31: qty 1000

## 2017-12-31 MED ORDER — SODIUM CHLORIDE 0.9 % IV BOLUS: 1000.0000 mL | Freq: Once | INTRAVENOUS | Status: DC

## 2017-12-31 MED ORDER — CEPHALEXIN 500 MG PO CAPS: 500.0000 mg | ORAL_CAPSULE | Freq: Four times a day (QID) | ORAL | 0 refills | Status: DC

## 2017-12-31 MED ORDER — ALBUTEROL SULFATE (2.5 MG/3ML) 0.083% IN NEBU
2.5000 mg | INHALATION_SOLUTION | Freq: Once | RESPIRATORY_TRACT | Status: AC
  Administered 2017-12-31: 2.5 mg via RESPIRATORY_TRACT
  Filled 2017-12-31: qty 3

## 2017-12-31 MED ORDER — CEFAZOLIN SODIUM-DEXTROSE 1-4 GM/50ML-% IV SOLN
1.0000 g | Freq: Once | INTRAVENOUS | Status: AC
  Administered 2017-12-31: 1 g via INTRAVENOUS
  Filled 2017-12-31: qty 50

## 2017-12-31 MED ORDER — IOPAMIDOL (ISOVUE-300) INJECTION 61%
100.0000 mL | Freq: Once | INTRAVENOUS | Status: AC | PRN
  Administered 2017-12-31: 100 mL via INTRAVENOUS

## 2017-12-31 MED ORDER — LIDOCAINE-EPINEPHRINE 2 %-1:100000 IJ SOLN
20.0000 mL | Freq: Once | INTRAMUSCULAR | Status: DC
  Filled 2017-12-31: qty 1

## 2017-12-31 MED ORDER — SODIUM CHLORIDE 0.9 % IV BOLUS
1000.0000 mL | Freq: Once | INTRAVENOUS | Status: AC
  Administered 2017-12-31: 1000 mL via INTRAVENOUS

## 2017-12-31 MED ORDER — LORAZEPAM 2 MG/ML IJ SOLN
1.0000 mg | Freq: Once | INTRAMUSCULAR | Status: AC
  Administered 2017-12-31: 1 mg via INTRAVENOUS
  Filled 2017-12-31: qty 1

## 2017-12-31 MED ORDER — TETANUS-DIPHTH-ACELL PERTUSSIS 5-2.5-18.5 LF-MCG/0.5 IM SUSP
0.5000 mL | Freq: Once | INTRAMUSCULAR | Status: AC
  Administered 2017-12-31: 0.5 mL via INTRAMUSCULAR
  Filled 2017-12-31: qty 0.5

## 2017-12-31 NOTE — ED Notes (Signed)
Pt refused to get an IV or get blood drawn for testing.

## 2017-12-31 NOTE — ED Notes (Signed)
Pt awake and alert, eating and drinking at this time

## 2017-12-31 NOTE — ED Provider Notes (Signed)
[facility] Emergency Department Provider Note ____________________________________________   I have reviewed the triage vital signs and the triage nursing note.  HISTORY  Chief Complaint Fall and Shortness of Breath   Historian Level 5 Caveat History Limited by intoxicated  HPI Brian [name] is a 62 y.o. male presents to the ED after fall down embankment.  Patient states he was urinating down into a creek and lost his balance and fell down quite a ways down the embankment and landed in a creek.  States that he is hurting all over.  States he was there for about an hour before he caught someone's attention to call for help.  Reporting headache, neck pain, shortness of breath, abdominal and back pain, as well as pain at his left forearm where he has a cut here.  Denies recent illnesses.     Past Medical History:  Diagnosis Date  . Alcohol abuse    Heavy alcohol abuse and homelessness  . Arthritis   . Cardiomyopathy (HCC)   . CHF (congestive heart failure) (HCC)   . Cocaine abuse (HCC)    And crack   . COPD (chronic obstructive pulmonary disease) (HCC)   . Heart failure   . Homelessness   . Noncompliance   . Paroxysmal VT (HCC)    a. first seen 2013 in setting of normal LVEF 55-60%, EF now decreased.  . Suicide attempt (HCC)   . Tobacco abuse     Patient Active Problem List   Diagnosis Date Noted  . COPD exacerbation (HCC) 12/15/2017  . Acute respiratory failure with hypoxia (HCC) 11/13/2017  . Goals of care, counseling/discussion   . Palliative care by specialist   . Acute on chronic systolic CHF (congestive heart failure), NYHA class 4 (HCC) 11/03/2017  . Hyperglycemia 11/03/2017  . Acute respiratory failure with hypoxia and hypercapnia (HCC) 10/26/2017  . Acute on chronic respiratory failure with hypoxia (HCC) 09/27/2017  . Tobacco dependence 09/27/2017  . Chronic systolic CHF (congestive heart failure) (HCC) 09/25/2017  . COPD with  acute exacerbation (HCC) 09/30/2015  . Substance induced mood disorder (HCC) 02/04/2013  . Polysubstance (excluding opioids) dependence, daily use (HCC) 02/03/2013  . Alcohol dependence (HCC) 02/03/2013  . Gout attack 11/12/2012  . Closed fracture of 5th metacarpal 11/12/2012  . Chest pain 08/27/2012  . Acute exacerbation of chronic obstructive pulmonary disease (COPD) (HCC)   . Homelessness   . Cocaine abuse [facility])     Past Surgical History:  Procedure Laterality Date  . INNER EAR SURGERY      Prior to Admission medications   Medication Sig Start Date End Date Taking? Authorizing Provider  albuterol (PROVENTIL HFA;VENTOLIN HFA) 108 (90 Base) MCG/ACT inhaler Inhale 2 puffs into the lungs every 6 (six) hours as needed for wheezing or shortness of breath. 12/18/17  Yes Leatha Gilding, MD  carvedilol (COREG) 6.25 MG tablet Take 1 tablet (6.25 mg total) by mouth 2 (two) times daily with a meal. 12/18/17 12/13/18 Yes Gherghe, Daylene Katayama, MD  folic acid (FOLVITE) 1 MG tablet Take 1 tablet (1 mg total) by mouth daily. 11/08/17 05/07/18 Yes [name], PA-C  furosemide (LASIX) 20 MG tablet Take 1 tablet (20 mg total) by mouth daily. 12/18/17  Yes Gherghe, Daylene Katayama, MD  mometasone-formoterol (DULERA) 200-5 MCG/ACT AERO Inhale 2 puffs into the lungs 2 (two) times daily. 12/18/17  Yes Gherghe, Daylene Katayama, MD  nicotine (NICODERM CQ - DOSED IN MG/24 HOURS) 21 mg/24hr patch Place 1 patch (  21 mg total) onto the skin daily. 11/08/17  Yes [name], PA-C  nitroGLYCERIN (NITROSTAT) 0.4 MG SL tablet Place 1 tablet (0.4 mg total) under the tongue every 5 (five) minutes as needed for chest pain. 11/08/17  Yes [name], PA-C  aspirin 81 MG EC tablet Take 1 tablet (81 mg total) by mouth daily. Patient not taking: Reported on 12/15/2017 11/08/17   [name], PA-C  doxycycline (VIBRA-TABS) 100 MG tablet Take 1 tablet (100 mg total) by mouth every 12 (twelve) hours. Patient not taking: Reported  on 12/31/2017 12/18/17   Leatha Gilding, MD  lisinopril (PRINIVIL,ZESTRIL) 5 MG tablet Take 1 tablet (5 mg total) by mouth daily. Patient not taking: Reported on 12/15/2017 11/08/17   [name], PA-C  Omeprazole 20 MG TBEC Take 1 tablet (20 mg total) by mouth daily. Patient not taking: Reported on 12/15/2017 11/16/17   Zannie Cove, MD  predniSONE (DELTASONE) 20 MG tablet Take 2 tablets (40 mg total) by mouth daily with breakfast. Take 40mg  for 2days then 20mg  for 2days then STOP Patient not taking: Reported on 12/31/2017 12/18/17   Leatha Gilding, MD  traZODone (DESYREL) 50 MG tablet Take 1 tablet (50 mg total) by mouth at bedtime as needed for sleep. Patient not taking: Reported on 12/15/2017 11/16/17   Zannie Cove, MD    No Known Allergies  Family History  Problem Relation Age of Onset  . Coronary artery disease Unknown   . Diabetes Unknown   . Cancer Sister     Social History Social History   Tobacco Use  . Smoking status: Current Every Day Smoker    Packs/day: 2.00    Types: Cigarettes  . Smokeless tobacco: Never Used  Substance Use Topics  . Alcohol use: Yes    Comment: Heavy. 5-10 40oz daily  . Drug use: No    Types: Marijuana, Cocaine    Comment: Marijuana and cocaine, crack    Review of Systems  Constitutional: Negative for recent illness. Eyes: Negative for visual changes. ENT: Negative for sore throat. Cardiovascular: Negative for chest pain. Respiratory: Positive for shortness of breath. Gastrointestinal: Positive for abdominal pain. Genitourinary: Negative for dysuria. Musculoskeletal: Positive for back pain. Skin: Negative for rash. Neurological: Positive for headache.  ____________________________________________   PHYSICAL EXAM:  VITAL SIGNS: ED Triage Vitals [12/31/17 1240]  Enc Vitals Group     BP (!) 119/100     Pulse Rate 94     Resp 16     Temp 97.6 F (36.4 C)     Temp Source Oral     SpO2 94 %     Weight 200 lb (90.7 kg)      Height 5\' 6"  (1.676 m)     Head Circumference      Peak Flow      Pain Score 10     Pain Loc      Pain Edu?      Excl. in GC?      Constitutional: Alert and smells heavily of alcohol.  HEENT      Head: Normocephalic and atraumatic.      Eyes: Conjunctivae are normal. Pupils equal and round.       Ears:         Nose: No congestion/rhinnorhea.      Mouth/Throat: Mucous membranes are moist.      Neck: No stridor.  Mild posterior cervical spine tenderness without step-off.  Small skin laceration right side of the  neck. Cardiovascular/Chest: Normal rate, regular rhythm.  No murmurs, rubs, or gallops. Respiratory: Normal respiratory effort without tachypnea nor retractions. Breath sounds are clear and equal bilaterally. No wheezes/rales/rhonchi. Gastrointestinal: Soft. No distention, no guarding.  Tender diffusely without rebound. Genitourinary/rectal:Deferred Musculoskeletal: Moving 4 extremities.  No deformity.  Left forearm puncture with palpable foreign body present. Neurologic:  No facial droop.  Normal speech and language. No gross or focal neurologic deficits are appreciated. Skin:  Skin is warm, dry. Sunburn face and arms. Psychiatric: Cooperative.   ____________________________________________  LABS (pertinent positives/negatives) I, Governor Rooks, MD the attending physician have reviewed the labs noted below.  Labs Reviewed  BASIC METABOLIC PANEL - Abnormal; Notable for the following components:      Result Value   Calcium 8.7 (*)    All other components within normal limits  CBC - Abnormal; Notable for the following components:   RDW 15.0 (*)    All other components within normal limits  TROPONIN I - Abnormal; Notable for the following components:   Troponin I 0.03 (*)    All other components within normal limits  ETHANOL - Abnormal; Notable for the following components:   Alcohol, Ethyl (B) 244 (*)    All other components within normal limits     ____________________________________________    EKG I, Governor Rooks, MD, the attending physician have personally viewed and interpreted all ECGs.  96 beats from appear normal sinus rhythm.  Narrow QS.  Normal axis.  Nonspecific ST and T wave ____________________________________________  RADIOLOGY   CT head and cervical spine, I viewed radiology report.  Somewhat limited by patient motion, but no acute intracranial abnormality.  Degenerative change of the cervical spine without acute bony ab normality.  CT chest abdomen pelvis with contrast, I viewed the radiologist report:   IMPRESSION: No definite evidence of acute traumatic injury involving the chest, abdomen or pelvis.  Coronary artery calcifications are noted suggesting coronary artery disease.  Diverticulosis of descending and sigmoid colon is noted. 5.2 x 0.9 cm crescent-shaped fluid collection is seen lateral to the descending colon which may represent inflammatory fluid collection from acute or chronic inflammation. Clinical correlation is recommended.  Small fat containing left inguinal hernia.  Aortic Atherosclerosis (ICD10-I70.0). __________________________________________  PROCEDURES  Procedure(s) performed:   Foreign body removal, left forearm.  2% lidocaine with epi used for local anesthesia.  5 cm x 0.5 cm wood foreign body removed from forearm with tweezers  Laceration repair, location right neck.  3 cm.  2 5-0 Vicryl interrupted stitches were placed after local anesthesia with 2% lidocaine with epinephrine and significant normal saline washout.  Procedures  Critical Care performed: None   ____________________________________________  ED COURSE / ASSESSMENT AND PLAN  Pertinent labs & imaging results that were available during my care of the patient were reviewed by me and considered in my medical decision making (see chart for details).    Patient arrived intoxicated after he fell down an  embankment and is complaining of pain all over but somewhat concerning regarding headache and neck pain and abdominal pain.  He is intoxicated, so a c-collar was placed.  Patient required small dose of Ativan to help him be calm enough to do CT scans.  The CT scans were somewhat limited by motion, but no clear intracranial, or cervical traumatic finding.  No traumatic finding of the chest abdomen pelvis CTs.  He does have a history of COPD and is hypoxic here.  I may go ahead and give him a  breathing treatment.  CT scans are reassuring for no severe traumatic injury.  Lacerations were extensively cleaned.  Right neck dogear was closed with 2 interrupted stitches.  I chose to go ahead and use Vicryl as this patient is homeless and I am really not sure if he is going to end up following up for stitches removal.  I am giving him a dose of Ancef given the large wood splinter that was removed from the forearm.  I am leaving this wound open, to puncture type wound.  I am prescribing Keflex, and I hope he is able to obtain this.  We did discuss watching for signs of infection, although he is still intoxicated and will need to have this discussed again prior to discharge.  Patient care transferred to Tammi Sou, NP.  When patient is sober and reevaluated, suspect likely discharge home.  I have prepared discharge instructions    CONSULTATIONS:   None   Patient / Family / Caregiver informed of clinical course, medical decision-making process, and agree with plan.   I discussed return precautions, follow-up instructions, and discharge instructions with patient and/or family.  Discharge Instructions : You are evaluated after a fall while intoxicated.  No serious injury is suspected on your exam and evaluation here in the emergency department.  A wood piece was removed from your left forearm.  This wound was left open so that it may drain.  You are being placed on antibiotics to help prevent  infection.  Return to the emergency department immediately for any worsening condition including redness, swelling, pus drainage, or uncontrolled pain.  2 dissolvable stitches were placed on your neck laceration.        ___________________________________________   FINAL CLINICAL IMPRESSION(S) / ED DIAGNOSES   Final diagnoses:  Alcoholic intoxication without complication (HCC)  COPD exacerbation (HCC)  Laceration of neck, initial encounter  Arm laceration, left, initial encounter      ___________________________________________         Note: This dictation was prepared with Dragon dictation. Any transcriptional errors that result from this process are unintentional    Governor Rooks, MD 12/31/17 [phone]

## 2017-12-31 NOTE — ED Notes (Signed)
c collar applied at this time.

## 2017-12-31 NOTE — ED Provider Notes (Addendum)
62-year-old gentleman who came in for alcohol intoxication.  Overall, the patient is hemodynamically stable although at this time he appears to be mildly tachycardic.  He does appear dry on my examination will receive intravenous fluids and reevaluation.  His EKG shows sinus tachycardia without any ischemic changes.  He did have an initial troponin of 0.03 and a repeat is 0.04, grossly unchanged.  He denies any chest pain, shortness of breath, lightheadedness or syncope, palpitations.  Will reevaluate him for final disposition.  I do not suspect that he is in withdrawal as his alcohol was in the 200s upon arrival, ACS or MI is very unlikely but we will continue to monitor him for any additional symptoms.  Plan reevaluation for final disposition.  ----------------------------------------- 9:55 PM on 12/31/2017 -----------------------------------------  Repeat heart rate on my examination shows heart rate of 85.  The patient continues to be symptom-free.  We will give him his third liter, and plan discharge.   Rockne Menghini, MD 12/31/17 2116    Rockne Menghini, MD 12/31/17 2156

## 2017-12-31 NOTE — ED Triage Notes (Signed)
PT to ED via EMS , pt had mechanical fall into creek, + ETOH, pt also states SOB xfew weeks. PT 87% on RA, placed on 2L. PT A&Ox4, slurred speech. PT states he is homeless. PT has lac to RT neck and LFT forearm.

## 2017-12-31 NOTE — ED Notes (Signed)
Date and time results received: 12/31/17 1345 (use smartphrase ".now" to insert current time)  Test: troponin Critical Value: 0.03  Name of Provider Notified: MD Shaune Pollack

## 2017-12-31 NOTE — ED Notes (Signed)
Patient transported to X-ray 

## 2017-12-31 NOTE — Discharge Instructions (Addendum)
Alcohol Abuse and Nutrition Alcohol abuse is any pattern of alcohol consumption that harms your health, relationships, or work. Alcohol abuse can affect how your body breaks down and absorbs nutrients from food by causing your liver to work abnormally. Additionally, many people who abuse alcohol do not eat enough carbohydrates, protein, fat, vitamins, and minerals. This can cause poor nutrition (malnutrition) and a lack of nutrients (nutrient deficiencies), which can lead to further complications. Nutrients that are commonly lacking (deficient) among people who abuse alcohol include:  Vitamins. ? Vitamin A. This is stored in your liver. It is important for your vision, metabolism, and ability to fight off infections (immunity). ? B vitamins. These include vitamins such as folate, thiamin, and niacin. These are important in new cell growth and maintenance. ? Vitamin C. This plays an important role in iron absorption, wound healing, and immunity. ? Vitamin D. This is produced by your liver, but you can also get vitamin D from food. Vitamin D is necessary for your body to absorb and use calcium.  Minerals. ? Calcium. This is important for your bones and your heart and blood vessel (cardiovascular) function. ? Iron. This is important for blood, muscle, and nervous system functioning. ? Magnesium. This plays an important role in muscle and nerve function, and it helps to control blood sugar and blood pressure. ? Zinc. This is important for the normal function of your nervous system and digestive system (gastrointestinal tract).  Nutrition is an essential component of therapy for alcohol abuse. Your health care provider or dietitian will work with you to design a plan that can help restore nutrients to your body and prevent potential complications. What is my plan? Your dietitian may develop a specific diet plan that is based on your condition and any other complications you may have. A diet plan will  commonly include:  A balanced diet. ? Grains: 6-8 oz per day. ? Vegetables: 2-3 cups per day. ? Fruits: 1-2 cups per day. ? Meat and other protein: 5-6 oz per day. ? Dairy: 2-3 cups per day.  Vitamin and mineral supplements.  What do I need to know about alcohol and nutrition?  Consume foods that are high in antioxidants, such as grapes, berries, nuts, green tea, and dark green and orange vegetables. This can help to counteract some of the stress that is placed on your liver by consuming alcohol.  Avoid food and drinks that are high in fat and sugar. Foods such as sugared soft drinks, salty snack foods, and candy contain empty calories. This means that they lack important nutrients such as protein, fiber, and vitamins.  Eat frequent meals and snacks. Try to eat 5-6 small meals each day.  Eat a variety of fresh fruits and vegetables each day. This will help you get plenty of water, fiber, and vitamins in your diet.  Drink plenty of water and other clear fluids. Try to drink at least 48-64 oz (1.5-2 L) of water per day.  If you are a vegetarian, eat a variety of protein-rich foods. Pair whole grains with plant-based proteins at meals and snacks to obtain the greatest nutrient benefit from your food. For example, eat rice with beans, put peanut butter on whole-grain toast, or eat oatmeal with sunflower seeds.  Soak beans and whole grains overnight before cooking. This can help your body to absorb the nutrients more easily.  Include foods fortified with vitamins and minerals in your diet. Commonly fortified foods include milk, orange juice, cereal, and bread.  If you are malnourished, your dietitian may recommend a high-protein, high-calorie diet. This may include: ? 2,000-3,000 calories (kilocalories) per day. ? 70-100 grams of protein per day.  Your health care provider may recommend a complete nutritional supplement beverage. This can help to restore calories, protein, and vitamins to  your body. Depending on your condition, you may be advised to consume this instead of or in addition to meals.  Limit your intake of caffeine. Replace drinks like coffee and black tea with decaffeinated coffee and herbal tea.  Eat a variety of foods that are high in omega fatty acids. These include fish, nuts and seeds, and soybeans. These foods may help your liver to recover and may also stabilize your mood.  Certain medicines may cause changes in your appetite, taste, and weight. Work with your health care provider and dietitian to make any adjustments to your medicines and diet plan.  Include other healthy lifestyle choices in your daily routine. ? Be physically active. ? Get enough sleep. ? Spend time doing activities that you enjoy.  If you are unable to take in enough food and calories by mouth, your health care provider may recommend a feeding tube. This is a tube that passes through your nose and throat, directly into your stomach. Nutritional supplement beverages can be given to you through the feeding tube to help you get the nutrients you need.  Take vitamin or mineral supplements as recommended by your health care provider. What foods can I eat? Grains Enriched pasta. Enriched rice. Fortified whole-grain bread. Fortified whole-grain cereal. Barley. Brown rice. Quinoa. [place]. Vegetables All fresh, frozen, and canned vegetables. Spinach. Kale. Artichoke. Carrots. Winter squash and pumpkin. Sweet potatoes. Broccoli. Cabbage. Cucumbers. Tomatoes. Sweet peppers. Green beans. Peas. Corn. Fruits All fresh and frozen fruits. Berries. Grapes. Mango. Papaya. Guava. Cherries. Apples. Bananas. Peaches. Plums. Pineapple. Watermelon. Cantaloupe. Oranges. Avocado. Meats and Other Protein Sources Beef liver. Lean beef. Pork. Fresh and canned chicken. Fresh fish. Oysters. Sardines. Canned tuna. Shrimp. Eggs with yolks. Nuts and seeds. Peanut butter. Beans and lentils. Soybeans.  Tofu. Dairy Whole, low-fat, and nonfat milk. Whole, low-fat, and nonfat yogurt. Cottage cheese. Sour cream. Hard and soft cheeses. Beverages Water. Herbal tea. Decaffeinated coffee. Decaffeinated green tea. 100% fruit juice. 100% vegetable juice. Instant breakfast shakes. Condiments Ketchup. Mayonnaise. Mustard. Salad dressing. Barbecue sauce. Sweets and Desserts Sugar-free ice cream. Sugar-free pudding. Sugar-free gelatin. Fats and Oils Butter. Vegetable oil, flaxseed oil, olive oil, and walnut oil. Other Complete nutrition shakes. Protein bars. Sugar-free gum. The items listed above may not be a complete list of recommended foods or beverages. Contact your dietitian for more options. What foods are not recommended? Grains Sugar-sweetened breakfast cereals. Flavored instant oatmeal. Fried breads. Vegetables Breaded or deep-fried vegetables. Fruits Dried fruit with added sugar. Candied fruit. Canned fruit in syrup. Meats and Other Protein Sources Breaded or deep-fried meats. Dairy Flavored milks. Fried cheese curds or fried cheese sticks. Beverages Alcohol. Sugar-sweetened soft drinks. Sugar-sweetened tea. Caffeinated coffee and tea. Condiments Sugar. Honey. Agave nectar. Molasses. Sweets and Desserts Chocolate. Cake. Cookies. Candy. Other Potato chips. Pretzels. Salted nuts. Candied nuts. The items listed above may not be a complete list of foods and beverages to avoid. Contact your dietitian for more information. This information is not intended to replace advice given to you by your health care provider. Make sure you discuss any questions you have with your health care provider. Document Released: 01/19/2005 Document Revised: 08/04/2015 Document Reviewed: 10/28/2013 Elsevier Interactive Patient Education  2018 [employer] You are evaluated after a fall while intoxicated.  No serious injury is suspected on your exam and evaluation here in the emergency department.  A wood  piece was removed from your left forearm.  This wound was left open so that it may drain.  You are being placed on antibiotics to help prevent infection.  Return to the emergency department immediately for any worsening condition including redness, swelling, pus drainage, or uncontrolled pain.  2 dissolvable stitches were placed on your neck laceration.

## 2017-12-31 NOTE — ED Provider Notes (Signed)
-----------------------------------------   6:00 PM on 12/31/2017 -----------------------------------------   Blood pressure (!) 149/105, pulse 97, temperature 97.6 F (36.4 C), temperature source Oral, resp. rate (!) 28, height 5\' 6"  (1.676 m), weight 90.7 kg, SpO2 91 %.  Assuming care from Dr. Shaune Pollack.  In short, Brian [name] is a 62 y.o. male with a chief complaint of Fall and Shortness of Breath .  Refer to the original H&P for additional details.  The current plan of care is to discharge him after his alcohol level reduces and he is competent to make decisions.  ----------------------------------------- 7:51 PM on 12/31/2017 -----------------------------------------  Patient's respiratory rate noted to be around 30 and heart rate is varying from 90-120. Additionally, his oxygen saturation is 89-91% on 4 liters of oxygen. He will awaken to voice. 1Liter of NS to be infused and will repeat his alcohol level.       Chinita Pester, FNP 01/01/18 0214    Rockne Menghini, MD 01/03/18 2152

## 2018-01-01 ENCOUNTER — Other Ambulatory Visit: Payer: Self-pay

## 2018-01-01 DIAGNOSIS — S27329A Contusion of lung, unspecified, initial encounter: Secondary | ICD-10-CM | POA: Diagnosis present

## 2018-01-01 LAB — TROPONIN I
Troponin I: 0.03 ng/mL (ref <0.03)
Troponin I: 0.03 ng/mL (ref <0.03)
Troponin I: 0.03 ng/mL (ref <0.03)

## 2018-01-01 LAB — TSH: TSH: 0.403 u[IU]/mL (ref 0.350–4.500)

## 2018-01-01 MED ORDER — CARVEDILOL 6.25 MG PO TABS
6.2500 mg | ORAL_TABLET | Freq: Two times a day (BID) | ORAL | Status: DC
  Administered 2018-01-01 – 2018-01-03 (×5): 6.25 mg via ORAL
  Filled 2018-01-01 (×5): qty 1

## 2018-01-01 MED ORDER — NITROGLYCERIN 0.4 MG SL SUBL: 0.4000 mg | SUBLINGUAL_TABLET | SUBLINGUAL | Status: DC | PRN

## 2018-01-01 MED ORDER — THIAMINE HCL 100 MG/ML IJ SOLN: 100.0000 mg | Freq: Every day | INTRAMUSCULAR | Status: DC

## 2018-01-01 MED ORDER — LORAZEPAM 2 MG/ML IJ SOLN: 1.0000 mg | Freq: Four times a day (QID) | INTRAMUSCULAR | Status: DC | PRN

## 2018-01-01 MED ORDER — LORAZEPAM 2 MG/ML IJ SOLN
0.0000 mg | Freq: Four times a day (QID) | INTRAMUSCULAR | Status: AC
  Administered 2018-01-01 (×2): 2 mg via INTRAVENOUS
  Filled 2018-01-01 (×2): qty 1

## 2018-01-01 MED ORDER — ADULT MULTIVITAMIN W/MINERALS CH
1.0000 | ORAL_TABLET | Freq: Every day | ORAL | Status: DC
  Administered 2018-01-01 – 2018-01-03 (×3): 1 via ORAL
  Filled 2018-01-01 (×3): qty 1

## 2018-01-01 MED ORDER — LORAZEPAM 2 MG/ML IJ SOLN: 0.0000 mg | Freq: Two times a day (BID) | INTRAMUSCULAR | Status: DC

## 2018-01-01 MED ORDER — LISINOPRIL 5 MG PO TABS
5.0000 mg | ORAL_TABLET | Freq: Every day | ORAL | Status: DC
  Administered 2018-01-01 – 2018-01-03 (×3): 5 mg via ORAL
  Filled 2018-01-01 (×3): qty 1

## 2018-01-01 MED ORDER — ACETAMINOPHEN 650 MG RE SUPP: 650.0000 mg | Freq: Four times a day (QID) | RECTAL | Status: DC | PRN

## 2018-01-01 MED ORDER — VITAMIN B-1 100 MG PO TABS
100.0000 mg | ORAL_TABLET | Freq: Every day | ORAL | Status: DC
  Administered 2018-01-01 – 2018-01-03 (×3): 100 mg via ORAL
  Filled 2018-01-01 (×3): qty 1

## 2018-01-01 MED ORDER — TRAZODONE HCL 50 MG PO TABS: 50.0000 mg | ORAL_TABLET | Freq: Every evening | ORAL | Status: DC | PRN

## 2018-01-01 MED ORDER — ONDANSETRON HCL 4 MG/2ML IJ SOLN: 4.0000 mg | Freq: Four times a day (QID) | INTRAMUSCULAR | Status: DC | PRN

## 2018-01-01 MED ORDER — DOCUSATE SODIUM 100 MG PO CAPS
100.0000 mg | ORAL_CAPSULE | Freq: Two times a day (BID) | ORAL | Status: DC
  Administered 2018-01-01 – 2018-01-03 (×5): 100 mg via ORAL
  Filled 2018-01-01 (×5): qty 1

## 2018-01-01 MED ORDER — ONDANSETRON HCL 4 MG PO TABS: 4.0000 mg | ORAL_TABLET | Freq: Four times a day (QID) | ORAL | Status: DC | PRN

## 2018-01-01 MED ORDER — LORAZEPAM 1 MG PO TABS: 1.0000 mg | ORAL_TABLET | Freq: Four times a day (QID) | ORAL | Status: DC | PRN

## 2018-01-01 MED ORDER — FOLIC ACID 1 MG PO TABS
1.0000 mg | ORAL_TABLET | Freq: Every day | ORAL | Status: DC
  Administered 2018-01-01 – 2018-01-03 (×3): 1 mg via ORAL
  Filled 2018-01-01 (×3): qty 1

## 2018-01-01 MED ORDER — FOLIC ACID 1 MG PO TABS: 1.0000 mg | ORAL_TABLET | Freq: Every day | ORAL | Status: DC

## 2018-01-01 MED ORDER — PANTOPRAZOLE SODIUM 40 MG PO TBEC
40.0000 mg | DELAYED_RELEASE_TABLET | Freq: Every day | ORAL | Status: DC
  Administered 2018-01-01 – 2018-01-03 (×3): 40 mg via ORAL
  Filled 2018-01-01 (×3): qty 1

## 2018-01-01 MED ORDER — SODIUM CHLORIDE 0.9 % IV SOLN
INTRAVENOUS | Status: DC
  Administered 2018-01-01 – 2018-01-02 (×4): via INTRAVENOUS

## 2018-01-01 MED ORDER — FUROSEMIDE 20 MG PO TABS
20.0000 mg | ORAL_TABLET | Freq: Every day | ORAL | Status: DC
  Administered 2018-01-01 – 2018-01-03 (×3): 20 mg via ORAL
  Filled 2018-01-01 (×3): qty 1

## 2018-01-01 MED ORDER — MOMETASONE FURO-FORMOTEROL FUM 200-5 MCG/ACT IN AERO
2.0000 | INHALATION_SPRAY | Freq: Two times a day (BID) | RESPIRATORY_TRACT | Status: DC
  Administered 2018-01-01 – 2018-01-03 (×6): 2 via RESPIRATORY_TRACT
  Filled 2018-01-01: qty 8.8

## 2018-01-01 MED ORDER — ALBUTEROL SULFATE (2.5 MG/3ML) 0.083% IN NEBU: 2.5000 mg | INHALATION_SOLUTION | RESPIRATORY_TRACT | Status: DC | PRN

## 2018-01-01 MED ORDER — NICOTINE 21 MG/24HR TD PT24
21.0000 mg | MEDICATED_PATCH | Freq: Every day | TRANSDERMAL | Status: DC
  Administered 2018-01-01 – 2018-01-03 (×3): 21 mg via TRANSDERMAL
  Filled 2018-01-01 (×3): qty 1

## 2018-01-01 MED ORDER — ACETAMINOPHEN 325 MG PO TABS
650.0000 mg | ORAL_TABLET | Freq: Four times a day (QID) | ORAL | Status: DC | PRN
  Administered 2018-01-02: 650 mg via ORAL
  Filled 2018-01-01: qty 2

## 2018-01-01 NOTE — Care Management Note (Signed)
Case Management Note  Patient Details  Name: Brian [name] MRN: [id number] Date of Birth: [birth year]  Subjective/Objective:      Attemped to assess and patient incoherent.              Action/Plan:   Expected Discharge Date:                  Expected Discharge Plan:     In-House Referral:     Discharge planning Services     Post Acute Care Choice:    Choice offered to:     DME Arranged:    DME Agency:     HH Arranged:    HH Agency:     Status of Service:     If discussed at [employer] of Stay Meetings, dates discussed:    Additional Comments:  Sherren Kerns, RN 01/01/2018, 4:51 PM

## 2018-01-01 NOTE — Progress Notes (Addendum)
Advanced care plan. Purpose of the Encounter: CODE STATUS Parties in Attendance: Patient Patient's Decision Capacity: Good Subjective/Patient's story: Presented to the emergency room for fall Objective/Medical story Patient has a history of drug abuse, alcohol abuse and cardiomyopathy He fell off the bridge and had a pulmonary contusion Needs observation and oxygen saturation monitoring Goals of care determination:  Advance care directives goals of care and treatment plan discussed Patient will be put on CIWA protocol for alcohol abuse Oxygen as needed to maintain O2 saturation Tobacco abuse Tobacco cessation counseled to the patient for 6 minutes Nicotine patch offered Patient wants everything done which includes CPR, intubation ventilator will need arises CODE STATUS: Full code Time spent discussing advanced care planning: 16 minutes

## 2018-01-01 NOTE — ED Notes (Addendum)
Report was given  

## 2018-01-01 NOTE — H&P (Signed)
Brian [name] is an 62 y.o. male.   Chief Complaint: Shortness of breath HPI: The patient with past medical history of drug abuse as well as alcohol abuse and cardiomyopathy secondary to the latter presents to the emergency department after falling off of a bridge.  The patient was trying to urinate and lost his balance.  He was also found to be intoxicated upon arrival.  Oxygen saturations were less than 88% on 2 L of nasal cannula and the patient complained of some chest wall pain.  Skeletal survey showed no bony abnormalities but the patient's symptoms were consistent with pulmonary effusion which prompted the emergency department staff to call the hospitalist service for admission.  Past Medical History:  Diagnosis Date  . Alcohol abuse    Heavy alcohol abuse and homelessness  . Arthritis   . Cardiomyopathy ([place])   . CHF (congestive heart failure) ([place])   . Cocaine abuse ([place])    And crack   . COPD (chronic obstructive pulmonary disease) ([place])   . Heart failure   . Homelessness   . Noncompliance   . Paroxysmal VT ([place])    a. first seen 2013 in setting of normal LVEF 55-60%, EF now decreased.  . Suicide attempt ([place])   . Tobacco abuse     Past Surgical History:  Procedure Laterality Date  . INNER EAR SURGERY      Family History  Problem Relation Age of Onset  . Coronary artery disease Unknown   . Diabetes Unknown   . Cancer Sister    Social History:  reports that he has been smoking cigarettes. He has been smoking about 2.00 packs per day. He has never used smokeless tobacco. He reports that he drinks alcohol. He reports that he does not use drugs.  Allergies: No Known Allergies  Medications Prior to Admission  Medication Sig Dispense Refill  . albuterol (PROVENTIL HFA;VENTOLIN HFA) 108 (90 Base) MCG/ACT inhaler Inhale 2 puffs into the lungs every 6 (six) hours as needed for wheezing or shortness of breath. 1 Inhaler 2  . carvedilol (COREG) 6.25 MG tablet Take 1 tablet  (6.25 mg total) by mouth 2 (two) times daily with a meal. 60 tablet 0  . folic acid (FOLVITE) 1 MG tablet Take 1 tablet (1 mg total) by mouth daily. 30 tablet 5  . furosemide (LASIX) 20 MG tablet Take 1 tablet (20 mg total) by mouth daily. 30 tablet 0  . mometasone-formoterol (DULERA) 200-5 MCG/ACT AERO Inhale 2 puffs into the lungs 2 (two) times daily. 1 Inhaler 2  . nicotine (NICODERM CQ - DOSED IN MG/24 HOURS) 21 mg/24hr patch Place 1 patch (21 mg total) onto the skin daily. 28 patch 0  . nitroGLYCERIN (NITROSTAT) 0.4 MG SL tablet Place 1 tablet (0.4 mg total) under the tongue every 5 (five) minutes as needed for chest pain. 25 tablet 2  . lisinopril (PRINIVIL,ZESTRIL) 5 MG tablet Take 1 tablet (5 mg total) by mouth daily. (Patient not taking: Reported on 12/15/2017) 30 tablet 5  . Omeprazole 20 MG TBEC Take 1 tablet (20 mg total) by mouth daily. (Patient not taking: Reported on 12/15/2017) 30 each 0  . traZODone (DESYREL) 50 MG tablet Take 1 tablet (50 mg total) by mouth at bedtime as needed for sleep. (Patient not taking: Reported on 12/15/2017) 15 tablet 0    Results for orders placed or performed during the hospital encounter of 12/31/17 (from the past 48 hour(s))  Basic metabolic panel  Status: Abnormal   Collection Time: 12/31/17 12:49 PM  Result Value Ref Range   Sodium 138 135 - 145 mmol/L   Potassium 4.0 3.5 - 5.1 mmol/L   Chloride 103 98 - 111 mmol/L   CO2 29 22 - 32 mmol/L   Glucose, Bld 86 70 - 99 mg/dL   BUN 13 6 - 20 mg/dL   Creatinine, Ser 0.70 0.61 - 1.24 mg/dL   Calcium 8.7 (L) 8.9 - 10.3 mg/dL   GFR calc non Af Amer >60 >60 mL/min   GFR calc Af Amer >60 >60 mL/min    Comment: (NOTE) The eGFR has been calculated using the CKD EPI equation. This calculation has not been validated in all clinical situations. eGFR's persistently <60 mL/min signify possible Chronic Kidney Disease.    Anion gap 6 5 - 15    Comment: Performed at [facility], [address]  [place], [place] [address]  CBC     Status: Abnormal   Collection Time: 12/31/17 12:49 PM  Result Value Ref Range   WBC 8.2 3.8 - 10.6 K/uL   RBC 4.96 4.40 - 5.90 MIL/uL   Hemoglobin 16.4 13.0 - 18.0 g/dL   HCT 48.9 40.0 - 52.0 %   MCV 98.7 80.0 - 100.0 fL   MCH 33.1 26.0 - 34.0 pg   MCHC 33.5 32.0 - 36.0 g/dL   RDW 15.0 (H) 11.5 - 14.5 %   Platelets 204 150 - 440 K/uL    Comment: Performed at [facility], [address] [place], [place] [address]  Troponin I     Status: Abnormal   Collection Time: 12/31/17 12:49 PM  Result Value Ref Range   Troponin I 0.03 (HH) <0.03 ng/mL    Comment: CRITICAL RESULT CALLED TO, READ BACK BY AND VERIFIED WITH Martinique LOYE ON 12/31/17 AT 1340 QSD Performed at [facility], [address], [place], [place] [address]   Ethanol     Status: Abnormal   Collection Time: 12/31/17 12:49 PM  Result Value Ref Range   Alcohol, Ethyl (B) 244 (H) <10 mg/dL    Comment: (NOTE) Lowest detectable limit for serum alcohol is 10 mg/dL. For medical purposes only. Performed at [facility], [address] [place], [place] [address]   Ethanol     Status: Abnormal   Collection Time: 12/31/17  8:04 PM  Result Value Ref Range   Alcohol, Ethyl (B) 116 (H) <10 mg/dL    Comment: (NOTE) Lowest detectable limit for serum alcohol is 10 mg/dL. For medical purposes only. Performed at [facility], [address] [place], [place] [address]   Troponin I     Status: Abnormal   Collection Time: 12/31/17  8:04 PM  Result Value Ref Range   Troponin I 0.04 (HH) <0.03 ng/mL    Comment: CRITICAL VALUE NOTED. VALUE IS CONSISTENT WITH PREVIOUSLY REPORTED/CALLED VALUE. JML Performed at [facility], [address] [place], [place] [address]   Troponin I     Status: Abnormal   Collection Time: 12/31/17 10:18 PM  Result Value Ref Range   Troponin I 0.04 (HH) <0.03 ng/mL    Comment: CRITICAL VALUE NOTED. VALUE IS CONSISTENT WITH  PREVIOUSLY REPORTED/CALLED VALUE. JML Performed at [facility], [address] [place], [place] [address]   TSH     Status: None   Collection Time: 01/01/18  3:57 AM  Result Value Ref Range   TSH 0.403 0.350 - 4.500 uIU/mL  Comment: Performed by a 3rd Generation assay with a functional sensitivity of <=0.01 uIU/mL. Performed at [facility], [address] [place], [place] [address]   Troponin I     Status: Abnormal   Collection Time: 01/01/18  3:57 AM  Result Value Ref Range   Troponin I 0.03 (HH) <0.03 ng/mL    Comment: CRITICAL VALUE NOTED. VALUE IS CONSISTENT WITH PREVIOUSLY REPORTED/CALLED VALUE...[facility] Performed at [facility], [address] [place], [place] [address]    Dg Chest 2 View  Result Date: 12/31/2017 CLINICAL DATA:  Fall.  Chest pain.  Shortness of breath. EXAM: CHEST - 2 VIEW COMPARISON:  12/25/2017 and 11/26/2017 FINDINGS: Heart size is upper limits of normal but unchanged. The trachea is midline. Lung markings are coarse in the lower chest. No focal airspace disease or pulmonary edema. Bony thorax is intact. Degenerative changes in the thoracic spine. No large effusions. IMPRESSION: No active cardiopulmonary disease. Electronically Signed   By: Markus Daft M.D.   On: 12/31/2017 13:13   Ct Head Wo Contrast  Result Date: 12/31/2017 CLINICAL DATA:  Recent fall into [place], initial encounter EXAM: CT HEAD WITHOUT CONTRAST CT CERVICAL SPINE WITHOUT CONTRAST TECHNIQUE: Multidetector CT imaging of the head and cervical spine was performed following the standard protocol without intravenous contrast. Multiplanar CT image reconstructions of the cervical spine were also generated. COMPARISON:  04/09/2016 FINDINGS: CT HEAD FINDINGS Brain: Somewhat limited by patient motion artifact. No findings to suggest acute hemorrhage, acute infarction or space-occupying mass lesion are noted. Vascular: No hyperdense vessel or unexpected calcification. Skull:  Normal. Negative for fracture or focal lesion. Healed fracture of the right zygomatic arch is again noted. Sinuses/Orbits: No acute finding. Other: None. CT CERVICAL SPINE FINDINGS Alignment: Within normal limits. Skull base and vertebrae: Imaging is significantly limited by patient motion artifact. No definitive acute fracture or acute facet abnormality is noted. Osteophytic changes and facet hypertrophic changes are seen. Soft tissues and spinal canal: Surrounding soft tissue structures demonstrate vascular calcifications. No hematoma or lymphadenopathy is identified. Upper chest: Within normal limits. Other: None IMPRESSION: CT of the head: Somewhat limited exam by patient motion artifact. No acute intracranial abnormality is noted. CT of the cervical spine: Somewhat limited by patient motion artifact. Degenerative changes of the cervical spine are noted without acute bony abnormality. Electronically Signed   By: Inez Catalina M.D.   On: 12/31/2017 14:41   Ct Chest W Contrast  Result Date: 12/31/2017 CLINICAL DATA:  Shortness of breath after fall. EXAM: CT CHEST, ABDOMEN, AND PELVIS WITH CONTRAST TECHNIQUE: Multidetector CT imaging of the chest, abdomen and pelvis was performed following the standard protocol during bolus administration of intravenous contrast. CONTRAST:  169m ISOVUE-300 IOPAMIDOL (ISOVUE-300) INJECTION 61% COMPARISON:  Radiographs of same day. CT scan of October 02, 2015 and March 04, 2013. FINDINGS: CT CHEST FINDINGS Cardiovascular: Atherosclerosis of thoracic aorta is noted without aneurysm or dissection. Normal cardiac size. No pericardial effusion. Coronary artery calcifications are noted. Mediastinum/Nodes: No enlarged mediastinal, hilar, or axillary lymph nodes. Thyroid gland, trachea, and esophagus demonstrate no significant findings. Lungs/Pleura: No pneumothorax or pleural effusion is noted. Minimal bibasilar subsegmental atelectasis is noted. Musculoskeletal: No chest wall mass or  suspicious bone lesions identified. CT ABDOMEN PELVIS FINDINGS Hepatobiliary: No focal liver abnormality is seen. No gallstones, gallbladder wall thickening, or biliary dilatation. Pancreas: Unremarkable. No pancreatic ductal dilatation or surrounding inflammatory changes. Spleen: Normal in size without focal abnormality. Adrenals/Urinary Tract: Adrenal glands are unremarkable. Kidneys are normal, without  renal calculi, focal lesion, or hydronephrosis. Bladder is unremarkable. Stomach/Bowel: Stomach is within normal limits. Appendix appears normal. Diverticulosis of descending and sigmoid colon is noted. 52 x 9 mm crescent-shaped fluid collection is seen lateral to the descending colon which may represent inflammatory fluid collection from previous inflammation. There is no evidence of bowel obstruction. Vascular/Lymphatic: Aortic atherosclerosis. No enlarged abdominal or pelvic lymph nodes. Reproductive: Stable prostatic calcifications. Other: Small fat containing left inguinal hernia is noted. No ascites is noted. Musculoskeletal: Bilateral pars defects are noted at L5. No acute osseous abnormality is noted. IMPRESSION: No definite evidence of acute traumatic injury involving the chest, abdomen or pelvis. Coronary artery calcifications are noted suggesting coronary artery disease. Diverticulosis of descending and sigmoid colon is noted. 5.2 x 0.9 cm crescent-shaped fluid collection is seen lateral to the descending colon which may represent inflammatory fluid collection from acute or chronic inflammation. Clinical correlation is recommended. Small fat containing left inguinal hernia. Aortic Atherosclerosis (ICD10-I70.0). Electronically Signed   By: Marijo Conception, M.D.   On: 12/31/2017 14:53   Ct Cervical Spine Wo Contrast  Result Date: 12/31/2017 CLINICAL DATA:  Recent fall into [place], initial encounter EXAM: CT HEAD WITHOUT CONTRAST CT CERVICAL SPINE WITHOUT CONTRAST TECHNIQUE: Multidetector CT imaging of  the head and cervical spine was performed following the standard protocol without intravenous contrast. Multiplanar CT image reconstructions of the cervical spine were also generated. COMPARISON:  04/09/2016 FINDINGS: CT HEAD FINDINGS Brain: Somewhat limited by patient motion artifact. No findings to suggest acute hemorrhage, acute infarction or space-occupying mass lesion are noted. Vascular: No hyperdense vessel or unexpected calcification. Skull: Normal. Negative for fracture or focal lesion. Healed fracture of the right zygomatic arch is again noted. Sinuses/Orbits: No acute finding. Other: None. CT CERVICAL SPINE FINDINGS Alignment: Within normal limits. Skull base and vertebrae: Imaging is significantly limited by patient motion artifact. No definitive acute fracture or acute facet abnormality is noted. Osteophytic changes and facet hypertrophic changes are seen. Soft tissues and spinal canal: Surrounding soft tissue structures demonstrate vascular calcifications. No hematoma or lymphadenopathy is identified. Upper chest: Within normal limits. Other: None IMPRESSION: CT of the head: Somewhat limited exam by patient motion artifact. No acute intracranial abnormality is noted. CT of the cervical spine: Somewhat limited by patient motion artifact. Degenerative changes of the cervical spine are noted without acute bony abnormality. Electronically Signed   By: Inez Catalina M.D.   On: 12/31/2017 14:41   Ct Abdomen Pelvis W Contrast  Result Date: 12/31/2017 CLINICAL DATA:  Shortness of breath after fall. EXAM: CT CHEST, ABDOMEN, AND PELVIS WITH CONTRAST TECHNIQUE: Multidetector CT imaging of the chest, abdomen and pelvis was performed following the standard protocol during bolus administration of intravenous contrast. CONTRAST:  196m ISOVUE-300 IOPAMIDOL (ISOVUE-300) INJECTION 61% COMPARISON:  Radiographs of same day. CT scan of October 02, 2015 and March 04, 2013. FINDINGS: CT CHEST FINDINGS Cardiovascular:  Atherosclerosis of thoracic aorta is noted without aneurysm or dissection. Normal cardiac size. No pericardial effusion. Coronary artery calcifications are noted. Mediastinum/Nodes: No enlarged mediastinal, hilar, or axillary lymph nodes. Thyroid gland, trachea, and esophagus demonstrate no significant findings. Lungs/Pleura: No pneumothorax or pleural effusion is noted. Minimal bibasilar subsegmental atelectasis is noted. Musculoskeletal: No chest wall mass or suspicious bone lesions identified. CT ABDOMEN PELVIS FINDINGS Hepatobiliary: No focal liver abnormality is seen. No gallstones, gallbladder wall thickening, or biliary dilatation. Pancreas: Unremarkable. No pancreatic ductal dilatation or surrounding inflammatory changes. Spleen: Normal in size without focal abnormality. Adrenals/Urinary Tract: Adrenal  glands are unremarkable. Kidneys are normal, without renal calculi, focal lesion, or hydronephrosis. Bladder is unremarkable. Stomach/Bowel: Stomach is within normal limits. Appendix appears normal. Diverticulosis of descending and sigmoid colon is noted. 52 x 9 mm crescent-shaped fluid collection is seen lateral to the descending colon which may represent inflammatory fluid collection from previous inflammation. There is no evidence of bowel obstruction. Vascular/Lymphatic: Aortic atherosclerosis. No enlarged abdominal or pelvic lymph nodes. Reproductive: Stable prostatic calcifications. Other: Small fat containing left inguinal hernia is noted. No ascites is noted. Musculoskeletal: Bilateral pars defects are noted at L5. No acute osseous abnormality is noted. IMPRESSION: No definite evidence of acute traumatic injury involving the chest, abdomen or pelvis. Coronary artery calcifications are noted suggesting coronary artery disease. Diverticulosis of descending and sigmoid colon is noted. 5.2 x 0.9 cm crescent-shaped fluid collection is seen lateral to the descending colon which may represent inflammatory  fluid collection from acute or chronic inflammation. Clinical correlation is recommended. Small fat containing left inguinal hernia. Aortic Atherosclerosis (ICD10-I70.0). Electronically Signed   By: Marijo Conception, M.D.   On: 12/31/2017 14:53    Review of Systems  Constitutional: Negative for chills and fever.  HENT: Negative for sore throat and tinnitus.   Eyes: Negative for blurred vision and redness.  Respiratory: Negative for cough and shortness of breath.   Cardiovascular: Positive for chest pain. Negative for palpitations, orthopnea and PND.  Gastrointestinal: Negative for abdominal pain, diarrhea, nausea and vomiting.  Genitourinary: Negative for dysuria, frequency and urgency.  Musculoskeletal: Negative for joint pain and myalgias.  Skin: Negative for rash.       No lesions  Neurological: Negative for speech change, focal weakness and weakness.  Endo/Heme/Allergies: Does not bruise/bleed easily.       No temperature intolerance  Psychiatric/Behavioral: Negative for depression and suicidal ideas.    Blood pressure 135/72, pulse (!) 110, temperature 98.9 F (37.2 C), temperature source Oral, resp. rate 18, height _0  (1.651 m), weight 87.7 kg, SpO2 92 %. Physical Exam  Vitals reviewed. Constitutional: He is oriented to person, place, and time. He appears well-developed and well-nourished. No distress.  HENT:  Head: Normocephalic and atraumatic.  Mouth/Throat: Oropharynx is clear and moist.  Eyes: Pupils are equal, round, and reactive to light. Conjunctivae and EOM are normal. No scleral icterus.  Neck: Normal range of motion. Neck supple. No JVD present. No tracheal deviation present. No thyromegaly present.  Cardiovascular: Normal rate, regular rhythm and normal heart sounds. Exam reveals no gallop and no friction rub.  No murmur heard. Respiratory: Effort normal and breath sounds normal. No respiratory distress.  GI: Soft. Bowel sounds are normal. He exhibits no  distension. There is no tenderness.  Genitourinary:  Genitourinary Comments: Deferred  Musculoskeletal: Normal range of motion. He exhibits no edema.  Lymphadenopathy:    He has no cervical adenopathy.  Neurological: He is alert and oriented to person, place, and time. No cranial nerve deficit.  Skin: Skin is warm and dry. No rash noted. No erythema.  Psychiatric: He has a normal mood and affect. His behavior is normal. Judgment and thought content normal.     Assessment/Plan This is a 62 year old male admitted for pulmonary contusion. 1.  Pulmonary contusion: Supplemental oxygen as needed. 2.  CHF: Stable; systolic.  Continue Coreg and Lasix per home regimen 3.  Hypertension: Usually controlled; continue lisinopril 4.  Alcohol abuse: Initiate CIWA scale 5.  DVT prophylaxis: Early ambulation 6.  GI prophylaxis: Pantoprazole per home regimen The patient  is a full code.  Time spent on admission orders and patient care approximately 45 minutes  Harrie Foreman, MD 01/01/2018, 7:41 AM

## 2018-01-01 NOTE — ED Provider Notes (Signed)
Assumes a revision of the patient at 11:00 PM from Dr. Sharma Covert. patient was evaluated by Trisha Mangle NP who advised me of the patient's clinical status as well.  I evaluated patient and noted the patient to be tachypneic with coarse rhonchorous breath sounds diffusely.  Patient's hypoxic at this time with O2 saturation of 86% on 2 L.  Patient given 2 albuterol treatments with improvement of oxygen saturation current sat 90% on 3 L via nasal cannula.  Patient has a known history of COPD and admits to smoking "a lot" of cigarettes daily.  Given patient's hypoxia with increased work of breathing recommend patient to be admitted.  Patient discussed with Dr. Anne Hahn for hospital admission further evaluation management of COPD exacerbation.  CRITICAL CARE Performed by: Darci Current   Total critical care time: 30 minutes  Critical care time was exclusive of separately billable procedures and treating other patients.  Critical care was necessary to treat or prevent imminent or life-threatening deterioration.  Critical care was time spent personally by me on the following activities: development of treatment plan with patient and/or surrogate as well as nursing, discussions with consultants, evaluation of patient's response to treatment, examination of patient, obtaining history from patient or surrogate, ordering and performing treatments and interventions, ordering and review of laboratory studies, ordering and review of radiographic studies, pulse oximetry and re-evaluation of patient's condition.    Darci Current, MD 01/01/18 [phone]

## 2018-01-02 ENCOUNTER — Observation Stay: Payer: Medicaid Other

## 2018-01-02 ENCOUNTER — Other Ambulatory Visit: Payer: Self-pay

## 2018-01-02 DIAGNOSIS — I700 Atherosclerosis of aorta: Secondary | ICD-10-CM | POA: Diagnosis present

## 2018-01-02 DIAGNOSIS — Z79899 Other long term (current) drug therapy: Secondary | ICD-10-CM | POA: Diagnosis not present

## 2018-01-02 DIAGNOSIS — Z716 Tobacco abuse counseling: Secondary | ICD-10-CM | POA: Diagnosis not present

## 2018-01-02 DIAGNOSIS — I110 Hypertensive heart disease with heart failure: Secondary | ICD-10-CM | POA: Diagnosis present

## 2018-01-02 DIAGNOSIS — J441 Chronic obstructive pulmonary disease with (acute) exacerbation: Secondary | ICD-10-CM | POA: Diagnosis present

## 2018-01-02 DIAGNOSIS — Z590 Homelessness: Secondary | ICD-10-CM | POA: Diagnosis not present

## 2018-01-02 DIAGNOSIS — I2510 Atherosclerotic heart disease of native coronary artery without angina pectoris: Secondary | ICD-10-CM | POA: Diagnosis present

## 2018-01-02 DIAGNOSIS — S51842A Puncture wound with foreign body of left forearm, initial encounter: Secondary | ICD-10-CM | POA: Diagnosis present

## 2018-01-02 DIAGNOSIS — Y9289 Other specified places as the place of occurrence of the external cause: Secondary | ICD-10-CM | POA: Diagnosis not present

## 2018-01-02 DIAGNOSIS — I5020 Unspecified systolic (congestive) heart failure: Secondary | ICD-10-CM | POA: Diagnosis present

## 2018-01-02 DIAGNOSIS — R000 Tachycardia, unspecified: Secondary | ICD-10-CM | POA: Diagnosis present

## 2018-01-02 DIAGNOSIS — K5730 Diverticulosis of large intestine without perforation or abscess without bleeding: Secondary | ICD-10-CM | POA: Diagnosis present

## 2018-01-02 DIAGNOSIS — Z7982 Long term (current) use of aspirin: Secondary | ICD-10-CM | POA: Diagnosis not present

## 2018-01-02 DIAGNOSIS — L559 Sunburn, unspecified: Secondary | ICD-10-CM | POA: Diagnosis present

## 2018-01-02 DIAGNOSIS — F10120 Alcohol abuse with intoxication, uncomplicated: Secondary | ICD-10-CM | POA: Diagnosis present

## 2018-01-02 DIAGNOSIS — W1781XA Fall down embankment (hill), initial encounter: Secondary | ICD-10-CM | POA: Diagnosis not present

## 2018-01-02 DIAGNOSIS — K4090 Unilateral inguinal hernia, without obstruction or gangrene, not specified as recurrent: Secondary | ICD-10-CM | POA: Diagnosis present

## 2018-01-02 DIAGNOSIS — Y9389 Activity, other specified: Secondary | ICD-10-CM | POA: Diagnosis not present

## 2018-01-02 DIAGNOSIS — Z7951 Long term (current) use of inhaled steroids: Secondary | ICD-10-CM | POA: Diagnosis not present

## 2018-01-02 DIAGNOSIS — F17210 Nicotine dependence, cigarettes, uncomplicated: Secondary | ICD-10-CM | POA: Diagnosis present

## 2018-01-02 DIAGNOSIS — Z915 Personal history of self-harm: Secondary | ICD-10-CM | POA: Diagnosis not present

## 2018-01-02 DIAGNOSIS — M25561 Pain in right knee: Secondary | ICD-10-CM | POA: Diagnosis present

## 2018-01-02 DIAGNOSIS — S1181XA Laceration without foreign body of other specified part of neck, initial encounter: Secondary | ICD-10-CM | POA: Diagnosis present

## 2018-01-02 DIAGNOSIS — Z23 Encounter for immunization: Secondary | ICD-10-CM | POA: Diagnosis not present

## 2018-01-02 DIAGNOSIS — Y908 Blood alcohol level of 240 mg/100 ml or more: Secondary | ICD-10-CM | POA: Diagnosis present

## 2018-01-02 DIAGNOSIS — I429 Cardiomyopathy, unspecified: Secondary | ICD-10-CM | POA: Diagnosis present

## 2018-01-02 LAB — BASIC METABOLIC PANEL
Anion gap: 5 (ref 5–15)
BUN: 11 mg/dL (ref 6–20)
CALCIUM: 8.5 mg/dL — AB (ref 8.9–10.3)
CO2: 33 mmol/L — ABNORMAL HIGH (ref 22–32)
CREATININE: 0.49 mg/dL — AB (ref 0.61–1.24)
Chloride: 103 mmol/L (ref 98–111)
GFR calc non Af Amer: >60 mL/min (ref >60)
Glucose, Bld: 114 mg/dL — ABNORMAL HIGH (ref 70–99)
Potassium: 4.5 mmol/L (ref 3.5–5.1)
SODIUM: 141 mmol/L (ref 135–145)

## 2018-01-02 MED ORDER — IPRATROPIUM-ALBUTEROL 0.5-2.5 (3) MG/3ML IN SOLN
3.0000 mL | Freq: Four times a day (QID) | RESPIRATORY_TRACT | Status: DC
  Administered 2018-01-02 – 2018-01-03 (×4): 3 mL via RESPIRATORY_TRACT
  Filled 2018-01-02 (×5): qty 3

## 2018-01-02 MED ORDER — MORPHINE SULFATE (PF) 2 MG/ML IV SOLN
2.0000 mg | INTRAVENOUS | Status: DC | PRN
  Administered 2018-01-02 – 2018-01-03 (×4): 2 mg via INTRAVENOUS
  Filled 2018-01-02 (×4): qty 1

## 2018-01-02 MED ORDER — SODIUM CHLORIDE 0.9% FLUSH
3.0000 mL | Freq: Two times a day (BID) | INTRAVENOUS | Status: DC
  Administered 2018-01-02: 3 mL via INTRAVENOUS

## 2018-01-02 MED ORDER — OXYCODONE HCL 5 MG PO TABS
10.0000 mg | ORAL_TABLET | ORAL | Status: DC | PRN
  Administered 2018-01-02 – 2018-01-03 (×3): 10 mg via ORAL
  Filled 2018-01-02 (×3): qty 2

## 2018-01-02 MED ORDER — OXYCODONE HCL 5 MG PO TABS
5.0000 mg | ORAL_TABLET | ORAL | Status: DC | PRN
  Administered 2018-01-02: 5 mg via ORAL
  Filled 2018-01-02: qty 1

## 2018-01-02 NOTE — Progress Notes (Signed)
[facility] - [place] [facility]                                                                                                                                                                                  Patient Demographics   Brian [name], is a 62 y.o. male, DOB - 11-Dec-1955, MWU:[id number]  Admit date - 12/31/2017   Admitting Physician Arnaldo Natal, MD  Outpatient Primary MD for the patient is [name], PA-C   LOS - 0  Subjective: Patient is a pain in the right knee.  Where he fell.  He is still on oxygen.  Denies any chest pain has some wheezing   Review of Systems:   CONSTITUTIONAL: No documented fever. No fatigue, weakness. No weight gain, no weight loss.  EYES: No blurry or double vision.  ENT: No tinnitus. No postnasal drip. No redness of the oropharynx.  RESPIRATORY: No cough, positive wheeze, no hemoptysis. No dyspnea.  CARDIOVASCULAR: No chest pain. No orthopnea. No palpitations. No syncope.  GASTROINTESTINAL: No nausea, no vomiting or diarrhea. No abdominal pain. No melena or hematochezia.  GENITOURINARY: No dysuria or hematuria.  ENDOCRINE: No polyuria or nocturia. No heat or cold intolerance.  HEMATOLOGY: No anemia. No bruising. No bleeding.  INTEGUMENTARY: No rashes. No lesions.  MUSCULOSKELETAL: No arthritis. No swelling. No gout.  NEUROLOGIC: No numbness, tingling, or ataxia. No seizure-type activity.  PSYCHIATRIC: No anxiety. No insomnia. No ADD.    Vitals:   Vitals:   01/01/18 1458 01/01/18 2013 01/02/18 0409 01/02/18 0738  BP: 113/70 128/89 (!) 153/96 (!) 151/97  Pulse: 70 83 88 72  Resp:  19    Temp: 98.1 F (36.7 C)  97.8 F (36.6 C) 97.9 F (36.6 C)  TempSrc: Oral  Oral Oral  SpO2: 97% 92% 99% 99%  Weight:   89 kg   Height:        Wt Readings from Last 3 Encounters:  01/02/18 89 kg  12/25/17 84 kg  12/15/17 84 kg     Intake/Output Summary (Last 24 hours) at 01/02/2018 1450 Last data filed at  01/02/2018 1258 Gross per 24 hour  Intake 1511.65 ml  Output 4250 ml  Net -2738.35 ml    Physical Exam:   GENERAL: Pleasant-appearing in no apparent distress.  HEAD, EYES, EARS, NOSE AND THROAT: Atraumatic, normocephalic. Extraocular muscles are intact. Pupils equal and reactive to light. Sclerae anicteric. No conjunctival injection. No oro-pharyngeal erythema.  NECK: Supple. There is no jugular venous distention. No bruits, no lymphadenopathy, no thyromegaly.  HEART: Regular rate and rhythm,. No murmurs, no rubs, no clicks.  LUNGS: Occasional wheezing ABDOMEN: Soft, flat, nontender, nondistended. Has  good bowel sounds. No hepatosplenomegaly appreciated.  EXTREMITIES: No evidence of any cyanosis, clubbing, or peripheral edema.  +2 pedal and radial pulses bilaterally.  NEUROLOGIC: The patient is alert, awake, and oriented x3 with no focal motor or sensory deficits appreciated bilaterally.  SKIN: Moist and warm with no rashes appreciated.  Psych: Not anxious, depressed LN: No inguinal LN enlargement    Antibiotics   Anti-infectives (From admission, onward)   Start     Dose/Rate Route Frequency Ordered Stop   12/31/17 1830  ceFAZolin (ANCEF) IVPB 1 g/50 mL premix     1 g 100 mL/hr over 30 Minutes Intravenous  Once 12/31/17 1830 12/31/17 2119   12/31/17 0000  cephALEXin (KEFLEX) 500 MG capsule     500 mg Oral 4 times daily 12/31/17 1830        Medications   Scheduled Meds: . carvedilol  6.25 mg Oral BID WC  . docusate sodium  100 mg Oral BID  . folic acid  1 mg Oral Daily  . furosemide  20 mg Oral Daily  . ipratropium-albuterol  3 mL Nebulization Q6H  . lidocaine-EPINEPHrine  20 mL Infiltration Once  . lisinopril  5 mg Oral Daily  . LORazepam  0-4 mg Intravenous Q6H   Followed by  . [START ON 01/03/2018] LORazepam  0-4 mg Intravenous Q12H  . mometasone-formoterol  2 puff Inhalation BID  . multivitamin with minerals  1 tablet Oral Daily  . nicotine  21 mg Transdermal Daily   . pantoprazole  40 mg Oral Daily  . thiamine  100 mg Oral Daily   Or  . thiamine  100 mg Intravenous Daily   Continuous Infusions: . sodium chloride 125 mL/hr at 01/02/18 1104  . sodium chloride     PRN Meds:.acetaminophen **OR** acetaminophen, albuterol, LORazepam **OR** LORazepam, morphine injection, nitroGLYCERIN, ondansetron **OR** ondansetron (ZOFRAN) IV, oxyCODONE, traZODone   Data Review:   Micro Results No results found for this or any previous visit (from the past 240 hour(s)).  Radiology Reports Dg Chest 2 View  Result Date: 12/31/2017 CLINICAL DATA:  Fall.  Chest pain.  Shortness of breath. EXAM: CHEST - 2 VIEW COMPARISON:  12/25/2017 and 11/26/2017 FINDINGS: Heart size is upper limits of normal but unchanged. The trachea is midline. Lung markings are coarse in the lower chest. No focal airspace disease or pulmonary edema. Bony thorax is intact. Degenerative changes in the thoracic spine. No large effusions. IMPRESSION: No active cardiopulmonary disease. Electronically Signed   By: Richarda Overlie M.D.   On: 12/31/2017 13:13   Dg Abd 1 View  Result Date: 12/17/2017 CLINICAL DATA:  62 year old male with generalized abdominal pain. Initial encounter. EXAM: ABDOMEN - 1 VIEW COMPARISON:  No comparison abdominal films. Comparison chest x-ray 12/15/2017. FINDINGS: Moderate stool throughout the colon. No evidence of small-bowel obstruction. The possibility of free intraperitoneal air cannot be assessed on a supine view. IMPRESSION: Moderate stool throughout the colon. Electronically Signed   By: Lacy Duverney M.D.   On: 12/17/2017 19:05   Ct Head Wo Contrast  Result Date: 12/31/2017 CLINICAL DATA:  Recent fall into [place], initial encounter EXAM: CT HEAD WITHOUT CONTRAST CT CERVICAL SPINE WITHOUT CONTRAST TECHNIQUE: Multidetector CT imaging of the head and cervical spine was performed following the standard protocol without intravenous contrast. Multiplanar CT image reconstructions of the  cervical spine were also generated. COMPARISON:  04/09/2016 FINDINGS: CT HEAD FINDINGS Brain: Somewhat limited by patient motion artifact. No findings to suggest acute hemorrhage, acute infarction or space-occupying  mass lesion are noted. Vascular: No hyperdense vessel or unexpected calcification. Skull: Normal. Negative for fracture or focal lesion. Healed fracture of the right zygomatic arch is again noted. Sinuses/Orbits: No acute finding. Other: None. CT CERVICAL SPINE FINDINGS Alignment: Within normal limits. Skull base and vertebrae: Imaging is significantly limited by patient motion artifact. No definitive acute fracture or acute facet abnormality is noted. Osteophytic changes and facet hypertrophic changes are seen. Soft tissues and spinal canal: Surrounding soft tissue structures demonstrate vascular calcifications. No hematoma or lymphadenopathy is identified. Upper chest: Within normal limits. Other: None IMPRESSION: CT of the head: Somewhat limited exam by patient motion artifact. No acute intracranial abnormality is noted. CT of the cervical spine: Somewhat limited by patient motion artifact. Degenerative changes of the cervical spine are noted without acute bony abnormality. Electronically Signed   By: Alcide Clever M.D.   On: 12/31/2017 14:41   Ct Chest W Contrast  Result Date: 12/31/2017 CLINICAL DATA:  Shortness of breath after fall. EXAM: CT CHEST, ABDOMEN, AND PELVIS WITH CONTRAST TECHNIQUE: Multidetector CT imaging of the chest, abdomen and pelvis was performed following the standard protocol during bolus administration of intravenous contrast. CONTRAST:  ISOVUE-300 IOPAMIDOL (ISOVUE-300) INJECTION 61% COMPARISON:  Radiographs of same day. CT scan of October 02, 2015 and March 04, 2013. FINDINGS: CT CHEST FINDINGS Cardiovascular: Atherosclerosis of thoracic aorta is noted without aneurysm or dissection. Normal cardiac size. No pericardial effusion. Coronary artery calcifications are  noted. Mediastinum/Nodes: No enlarged mediastinal, hilar, or axillary lymph nodes. Thyroid gland, trachea, and esophagus demonstrate no significant findings. Lungs/Pleura: No pneumothorax or pleural effusion is noted. Minimal bibasilar subsegmental atelectasis is noted. Musculoskeletal: No chest wall mass or suspicious bone lesions identified. CT ABDOMEN PELVIS FINDINGS Hepatobiliary: No focal liver abnormality is seen. No gallstones, gallbladder wall thickening, or biliary dilatation. Pancreas: Unremarkable. No pancreatic ductal dilatation or surrounding inflammatory changes. Spleen: Normal in size without focal abnormality. Adrenals/Urinary Tract: Adrenal glands are unremarkable. Kidneys are normal, without renal calculi, focal lesion, or hydronephrosis. Bladder is unremarkable. Stomach/Bowel: Stomach is within normal limits. Appendix appears normal. Diverticulosis of descending and sigmoid colon is noted. 52 x 9 mm crescent-shaped fluid collection is seen lateral to the descending colon which may represent inflammatory fluid collection from previous inflammation. There is no evidence of bowel obstruction. Vascular/Lymphatic: Aortic atherosclerosis. No enlarged abdominal or pelvic lymph nodes. Reproductive: Stable prostatic calcifications. Other: Small fat containing left inguinal hernia is noted. No ascites is noted. Musculoskeletal: Bilateral pars defects are noted at L5. No acute osseous abnormality is noted. IMPRESSION: No definite evidence of acute traumatic injury involving the chest, abdomen or pelvis. Coronary artery calcifications are noted suggesting coronary artery disease. Diverticulosis of descending and sigmoid colon is noted. 5.2 x 0.9 cm crescent-shaped fluid collection is seen lateral to the descending colon which may represent inflammatory fluid collection from acute or chronic inflammation. Clinical correlation is recommended. Small fat containing left inguinal hernia. Aortic Atherosclerosis  (ICD10-I70.0). Electronically Signed   By: Lupita Raider, M.D.   On: 12/31/2017 14:53   Ct Cervical Spine Wo Contrast  Result Date: 12/31/2017 CLINICAL DATA:  Recent fall into [place], initial encounter EXAM: CT HEAD WITHOUT CONTRAST CT CERVICAL SPINE WITHOUT CONTRAST TECHNIQUE: Multidetector CT imaging of the head and cervical spine was performed following the standard protocol without intravenous contrast. Multiplanar CT image reconstructions of the cervical spine were also generated. COMPARISON:  04/09/2016 FINDINGS: CT HEAD FINDINGS Brain: Somewhat limited by patient motion artifact. No findings to suggest  acute hemorrhage, acute infarction or space-occupying mass lesion are noted. Vascular: No hyperdense vessel or unexpected calcification. Skull: Normal. Negative for fracture or focal lesion. Healed fracture of the right zygomatic arch is again noted. Sinuses/Orbits: No acute finding. Other: None. CT CERVICAL SPINE FINDINGS Alignment: Within normal limits. Skull base and vertebrae: Imaging is significantly limited by patient motion artifact. No definitive acute fracture or acute facet abnormality is noted. Osteophytic changes and facet hypertrophic changes are seen. Soft tissues and spinal canal: Surrounding soft tissue structures demonstrate vascular calcifications. No hematoma or lymphadenopathy is identified. Upper chest: Within normal limits. Other: None IMPRESSION: CT of the head: Somewhat limited exam by patient motion artifact. No acute intracranial abnormality is noted. CT of the cervical spine: Somewhat limited by patient motion artifact. Degenerative changes of the cervical spine are noted without acute bony abnormality. Electronically Signed   By: Alcide Clever M.D.   On: 12/31/2017 14:41   Ct Abdomen Pelvis W Contrast  Result Date: 12/31/2017 CLINICAL DATA:  Shortness of breath after fall. EXAM: CT CHEST, ABDOMEN, AND PELVIS WITH CONTRAST TECHNIQUE: Multidetector CT imaging of the chest,  abdomen and pelvis was performed following the standard protocol during bolus administration of intravenous contrast. CONTRAST:  ISOVUE-300 IOPAMIDOL (ISOVUE-300) INJECTION 61% COMPARISON:  Radiographs of same day. CT scan of October 02, 2015 and March 04, 2013. FINDINGS: CT CHEST FINDINGS Cardiovascular: Atherosclerosis of thoracic aorta is noted without aneurysm or dissection. Normal cardiac size. No pericardial effusion. Coronary artery calcifications are noted. Mediastinum/Nodes: No enlarged mediastinal, hilar, or axillary lymph nodes. Thyroid gland, trachea, and esophagus demonstrate no significant findings. Lungs/Pleura: No pneumothorax or pleural effusion is noted. Minimal bibasilar subsegmental atelectasis is noted. Musculoskeletal: No chest wall mass or suspicious bone lesions identified. CT ABDOMEN PELVIS FINDINGS Hepatobiliary: No focal liver abnormality is seen. No gallstones, gallbladder wall thickening, or biliary dilatation. Pancreas: Unremarkable. No pancreatic ductal dilatation or surrounding inflammatory changes. Spleen: Normal in size without focal abnormality. Adrenals/Urinary Tract: Adrenal glands are unremarkable. Kidneys are normal, without renal calculi, focal lesion, or hydronephrosis. Bladder is unremarkable. Stomach/Bowel: Stomach is within normal limits. Appendix appears normal. Diverticulosis of descending and sigmoid colon is noted. 52 x 9 mm crescent-shaped fluid collection is seen lateral to the descending colon which may represent inflammatory fluid collection from previous inflammation. There is no evidence of bowel obstruction. Vascular/Lymphatic: Aortic atherosclerosis. No enlarged abdominal or pelvic lymph nodes. Reproductive: Stable prostatic calcifications. Other: Small fat containing left inguinal hernia is noted. No ascites is noted. Musculoskeletal: Bilateral pars defects are noted at L5. No acute osseous abnormality is noted. IMPRESSION: No definite evidence of acute  traumatic injury involving the chest, abdomen or pelvis. Coronary artery calcifications are noted suggesting coronary artery disease. Diverticulosis of descending and sigmoid colon is noted. 5.2 x 0.9 cm crescent-shaped fluid collection is seen lateral to the descending colon which may represent inflammatory fluid collection from acute or chronic inflammation. Clinical correlation is recommended. Small fat containing left inguinal hernia. Aortic Atherosclerosis (ICD10-I70.0). Electronically Signed   By: Lupita Raider, M.D.   On: 12/31/2017 14:53   Dg Chest Port 1 View  Result Date: 12/15/2017 CLINICAL DATA:  Shortness of breath. Hypoxia. COPD and congestive heart failure. EXAM: PORTABLE CHEST 1 VIEW COMPARISON:  11/28/2017 FINDINGS: Stable mild cardiomegaly. Aortic atherosclerosis. Both lungs are clear. IMPRESSION: Stable mild cardiomegaly.  No active lung disease. Electronically Signed   By: Myles Rosenthal M.D.   On: 12/15/2017 14:49   Dg Knee Complete 4 Views  Right  Result Date: 01/02/2018 CLINICAL DATA:  Right knee pain after fall. EXAM: RIGHT KNEE - COMPLETE 4+ VIEW COMPARISON:  Radiographs of Aug 16, 2014. FINDINGS: No evidence of fracture, dislocation, or joint effusion. No evidence of arthropathy or other focal bone abnormality. Soft tissues are unremarkable. IMPRESSION: Normal right knee. Electronically Signed   By: Lupita Raider, M.D.   On: 01/02/2018 09:47     CBC Recent Labs  Lab 12/31/17 1249  WBC 8.2  HGB 16.4  HCT 48.9  PLT 204  MCV 98.7  MCH 33.1  MCHC 33.5  RDW 15.0*    Chemistries  Recent Labs  Lab 12/31/17 1249 01/02/18 0520  NA 138 141  K 4.0 4.5  CL 103 103  CO2 29 33*  GLUCOSE 86 114*  BUN 13 11  CREATININE 0.70 0.49*  CALCIUM 8.7* 8.5*   ------------------------------------------------------------------------------------------------------------------ estimated creatinine clearance is 102 mL/min (A) (by C-G formula based on SCr of 0.49 mg/dL  (L)). ------------------------------------------------------------------------------------------------------------------ No results for input(s): HGBA1C in the last 72 hours. ------------------------------------------------------------------------------------------------------------------ No results for input(s): CHOL, HDL, LDLCALC, TRIG, CHOLHDL, LDLDIRECT in the last 72 hours. ------------------------------------------------------------------------------------------------------------------ Recent Labs    01/01/18 0357  TSH 0.403   ------------------------------------------------------------------------------------------------------------------ No results for input(s): VITAMINB12, FOLATE, FERRITIN, TIBC, IRON, RETICCTPCT in the last 72 hours.  Coagulation profile No results for input(s): INR, PROTIME in the last 168 hours.  No results for input(s): DDIMER in the last 72 hours.  Cardiac Enzymes Recent Labs  Lab 01/01/18 0357 01/01/18 0925 01/01/18 1511  TROPONINI 0.03* 0.03* 0.03*   ------------------------------------------------------------------------------------------------------------------ Invalid input(s): POCBNP    Assessment & Plan   1.  Pulmonary contusion: Supplemental oxygen as needed.  Wean oxygen  2.  CHF: Stable; systolic.  Continue Coreg and Lasix per home regimen currently stable 3.  Hypertension: Usually controlled; continue lisinopril 4.  Alcohol abuse: I continue CIWA 5.  Knee pain obtain x-ray  6.  GI prophylaxis: Pantoprazole per home regimen      Code Status Orders  (From admission, onward)         Start     Ordered   01/01/18 0330  Full code  Continuous     01/01/18 0329        Code Status History    Date Active Date Inactive Code Status Order ID Comments User Context   12/15/2017 1726 12/18/2017 1747 Full Code [id number]  Myrtie Neither, MD ED   11/13/2017 0439 11/16/2017 1312 Full Code [id number]  Eduard Clos, MD ED   11/03/2017  1553 11/06/2017 1712 Full Code [id number]  Lahoma Crocker, MD ED   10/26/2017 2338 10/28/2017 2243 Full Code [id number]  Hugelmeyer, Alexis, DO ED   10/14/2017 2207 10/16/2017 1644 Full Code [id number]  Briscoe Deutscher, MD ED   09/27/2017 1052 10/01/2017 1615 Full Code [id number]  Jonah Blue, MD ED   09/25/2017 0504 09/26/2017 1633 Full Code [id number]  Briscoe Deutscher, MD ED   10/20/2015 0727 10/22/2015 1705 Full Code [id number]  Eduard Clos, MD Inpatient   09/30/2015 2027 10/07/2015 1542 Full Code [id number]  Levie Heritage, DO Inpatient   02/03/2013 1543 02/08/2013 1137 Full Code [id number]  Nanine Means, NP Inpatient   02/03/2013 0347 02/03/2013 1543 Full Code [id number]  Angus Seller, PA-C ED   11/10/2012 2026 11/12/2012 1654 Full Code [id number]  Maretta Bees, MD Inpatient   08/07/2012 0158 08/08/2012 1554 Full Code [id number]  Jaci Carrel, PA-C ED   06/02/2012 2032 06/04/2012 6283  Full Code [id number]  Felipa Furnace ED   04/20/2011 0506 04/22/2011 1428 Full Code [id number]  Rollene Rotunda, RN ED   03/22/2011 0600 03/22/2011 2315 Full Code [id number]  Cyndra Numbers, MD ED           Consults none  DVT Prophylaxis  Lovenox    Lab Results  Component Value Date   PLT 204 12/31/2017     Time Spent in minutes  Greater than 50% of time spent in care coordination and counseling patient regarding the condition and plan of care.   Auburn Bilberry M.D on 01/02/2018 at 2:50 PM  Between 7am to 6pm - Pager - [phone]  After 6pm go to [email or web address] - Social research officer, government  [employer] [facility]   Office  [phone]

## 2018-01-03 MED ORDER — DOXYCYCLINE HYCLATE 100 MG PO TABS: 100.0000 mg | ORAL_TABLET | Freq: Two times a day (BID) | ORAL | 0 refills | Status: DC

## 2018-01-03 MED ORDER — OXYCODONE-ACETAMINOPHEN 5-325 MG PO TABS: 1.0000 | ORAL_TABLET | ORAL | 0 refills | Status: DC | PRN

## 2018-01-03 MED ORDER — DOXYCYCLINE HYCLATE 100 MG PO TABS
100.0000 mg | ORAL_TABLET | Freq: Two times a day (BID) | ORAL | Status: DC
  Administered 2018-01-03: 100 mg via ORAL
  Filled 2018-01-03: qty 1

## 2018-01-03 NOTE — Progress Notes (Signed)
Patient given all discharge instructions and prescriptions, all questions answered. Patient discharged with niece. All belongings sent with him. PIV x1 removed.

## 2018-01-03 NOTE — Plan of Care (Signed)
Patient continues to have dyspnea at rest. Oxygen has been reapplied due to low pulse ox. Patient's pain is unrelieved with pain medication.
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
## 2018-01-03 NOTE — Discharge Summary (Signed)
[facility] - [place] [facility]- Anson [name], 62 y.o., DOB June 15, 1955, MRN [id number]. Admission date: 12/31/2017 Discharge Date 01/03/2018 Primary MD [name], PA-C Admitting Physician Arnaldo Natal, MD  Admission Diagnosis  COPD exacerbation [facility]) [J44.1] Laceration of neck, initial encounter [S11.91XA] Arm laceration, left, initial encounter [S41.112A] Alcoholic intoxication without complication [facility]) [F10.920]  Discharge Diagnosis   Active Problems:   Pulmonary contusion Chronic systolic CHF Essential hypertension Alcohol abuse Knee pain without x-ray showing any fracture         Hospital Course Patient is 62 year old with drug abuse and alcohol abuse who has 8 admissions in [place] for various reasons who presented to our hospital after falling off a bridge.  Stated that he was trying to urinate and lost his balance.  Patient was also intoxicated oxygen level was low on admission.  There was some concern that he might have pulmonary contusion, patient was monitored his x-ray and CT scan showed no abnormality.  Patient continued to complain of pain in various areas of his body.  His weaned off his oxygen.  Case manager has been consulted regarding his living situation.            Consults  None  Significant Tests:  See full reports for all details     Dg Chest 2 View  Result Date: 12/31/2017 CLINICAL DATA:  Fall.  Chest pain.  Shortness of breath. EXAM: CHEST - 2 VIEW COMPARISON:  12/25/2017 and 11/26/2017 FINDINGS: Heart size is upper limits of normal but unchanged. The trachea is midline. Lung markings are coarse in the lower chest. No focal airspace disease or pulmonary edema. Bony thorax is intact. Degenerative changes in the thoracic spine. No large effusions. IMPRESSION: No active cardiopulmonary disease. Electronically Signed   By: Richarda Overlie M.D.   On: 12/31/2017 13:13   Dg Abd 1 View  Result Date: 12/17/2017 CLINICAL  DATA:  62 year old male with generalized abdominal pain. Initial encounter. EXAM: ABDOMEN - 1 VIEW COMPARISON:  No comparison abdominal films. Comparison chest x-ray 12/15/2017. FINDINGS: Moderate stool throughout the colon. No evidence of small-bowel obstruction. The possibility of free intraperitoneal air cannot be assessed on a supine view. IMPRESSION: Moderate stool throughout the colon. Electronically Signed   By: Lacy Duverney M.D.   On: 12/17/2017 19:05   Ct Head Wo Contrast  Result Date: 12/31/2017 CLINICAL DATA:  Recent fall [place], initial encounter EXAM: CT HEAD WITHOUT CONTRAST CT CERVICAL SPINE WITHOUT CONTRAST TECHNIQUE: Multidetector CT imaging of the head and cervical spine was performed following the standard protocol without intravenous contrast. Multiplanar CT image reconstructions of the cervical spine were also generated. COMPARISON:  04/09/2016 FINDINGS: CT HEAD FINDINGS Brain: Somewhat limited by patient motion artifact. No findings to suggest acute hemorrhage, acute infarction or space-occupying mass lesion are noted. Vascular: No hyperdense vessel or unexpected calcification. Skull: Normal. Negative for fracture or focal lesion. Healed fracture of the right zygomatic arch is again noted. Sinuses/Orbits: No acute finding. Other: None. CT CERVICAL SPINE FINDINGS Alignment: Within normal limits. Skull base and vertebrae: Imaging is significantly limited by patient motion artifact. No definitive acute fracture or acute facet abnormality is noted. Osteophytic changes and facet hypertrophic changes are seen. Soft tissues and spinal canal: Surrounding soft tissue structures demonstrate vascular calcifications. No hematoma or lymphadenopathy is identified. Upper chest: Within normal limits. Other: None IMPRESSION: CT of the head: Somewhat limited exam by patient motion artifact. No acute intracranial abnormality is noted. CT of the  cervical spine: Somewhat limited by patient motion artifact.  Degenerative changes of the cervical spine are noted without acute bony abnormality. Electronically Signed   By: Alcide Clever M.D.   On: 12/31/2017 14:41   Ct Chest W Contrast  Result Date: 12/31/2017 CLINICAL DATA:  Shortness of breath after fall. EXAM: CT CHEST, ABDOMEN, AND PELVIS WITH CONTRAST TECHNIQUE: Multidetector CT imaging of the chest, abdomen and pelvis was performed following the standard protocol during bolus administration of intravenous contrast. CONTRAST:  ISOVUE-300 IOPAMIDOL (ISOVUE-300) INJECTION 61% COMPARISON:  Radiographs of same day. CT scan of October 02, 2015 and March 04, 2013. FINDINGS: CT CHEST FINDINGS Cardiovascular: Atherosclerosis of thoracic aorta is noted without aneurysm or dissection. Normal cardiac size. No pericardial effusion. Coronary artery calcifications are noted. Mediastinum/Nodes: No enlarged mediastinal, hilar, or axillary lymph nodes. Thyroid gland, trachea, and esophagus demonstrate no significant findings. Lungs/Pleura: No pneumothorax or pleural effusion is noted. Minimal bibasilar subsegmental atelectasis is noted. Musculoskeletal: No chest wall mass or suspicious bone lesions identified. CT ABDOMEN PELVIS FINDINGS Hepatobiliary: No focal liver abnormality is seen. No gallstones, gallbladder wall thickening, or biliary dilatation. Pancreas: Unremarkable. No pancreatic ductal dilatation or surrounding inflammatory changes. Spleen: Normal in size without focal abnormality. Adrenals/Urinary Tract: Adrenal glands are unremarkable. Kidneys are normal, without renal calculi, focal lesion, or hydronephrosis. Bladder is unremarkable. Stomach/Bowel: Stomach is within normal limits. Appendix appears normal. Diverticulosis of descending and sigmoid colon is noted. 52 x 9 mm crescent-shaped fluid collection is seen lateral to the descending colon which may represent inflammatory fluid collection from previous inflammation. There is no evidence of bowel obstruction.  Vascular/Lymphatic: Aortic atherosclerosis. No enlarged abdominal or pelvic lymph nodes. Reproductive: Stable prostatic calcifications. Other: Small fat containing left inguinal hernia is noted. No ascites is noted. Musculoskeletal: Bilateral pars defects are noted at L5. No acute osseous abnormality is noted. IMPRESSION: No definite evidence of acute traumatic injury involving the chest, abdomen or pelvis. Coronary artery calcifications are noted suggesting coronary artery disease. Diverticulosis of descending and sigmoid colon is noted. 5.2 x 0.9 cm crescent-shaped fluid collection is seen lateral to the descending colon which may represent inflammatory fluid collection from acute or chronic inflammation. Clinical correlation is recommended. Small fat containing left inguinal hernia. Aortic Atherosclerosis (ICD10-I70.0). Electronically Signed   By: Lupita Raider, M.D.   On: 12/31/2017 14:53   Ct Cervical Spine Wo Contrast  Result Date: 12/31/2017 CLINICAL DATA:  Recent fall into [place], initial encounter EXAM: CT HEAD WITHOUT CONTRAST CT CERVICAL SPINE WITHOUT CONTRAST TECHNIQUE: Multidetector CT imaging of the head and cervical spine was performed following the standard protocol without intravenous contrast. Multiplanar CT image reconstructions of the cervical spine were also generated. COMPARISON:  04/09/2016 FINDINGS: CT HEAD FINDINGS Brain: Somewhat limited by patient motion artifact. No findings to suggest acute hemorrhage, acute infarction or space-occupying mass lesion are noted. Vascular: No hyperdense vessel or unexpected calcification. Skull: Normal. Negative for fracture or focal lesion. Healed fracture of the right zygomatic arch is again noted. Sinuses/Orbits: No acute finding. Other: None. CT CERVICAL SPINE FINDINGS Alignment: Within normal limits. Skull base and vertebrae: Imaging is significantly limited by patient motion artifact. No definitive acute fracture or acute facet abnormality is  noted. Osteophytic changes and facet hypertrophic changes are seen. Soft tissues and spinal canal: Surrounding soft tissue structures demonstrate vascular calcifications. No hematoma or lymphadenopathy is identified. Upper chest: Within normal limits. Other: None IMPRESSION: CT of the head: Somewhat limited exam by patient motion artifact. No acute  intracranial abnormality is noted. CT of the cervical spine: Somewhat limited by patient motion artifact. Degenerative changes of the cervical spine are noted without acute bony abnormality. Electronically Signed   By: Alcide Clever M.D.   On: 12/31/2017 14:41   Ct Abdomen Pelvis W Contrast  Result Date: 12/31/2017 CLINICAL DATA:  Shortness of breath after fall. EXAM: CT CHEST, ABDOMEN, AND PELVIS WITH CONTRAST TECHNIQUE: Multidetector CT imaging of the chest, abdomen and pelvis was performed following the standard protocol during bolus administration of intravenous contrast. CONTRAST:  ISOVUE-300 IOPAMIDOL (ISOVUE-300) INJECTION 61% COMPARISON:  Radiographs of same day. CT scan of October 02, 2015 and March 04, 2013. FINDINGS: CT CHEST FINDINGS Cardiovascular: Atherosclerosis of thoracic aorta is noted without aneurysm or dissection. Normal cardiac size. No pericardial effusion. Coronary artery calcifications are noted. Mediastinum/Nodes: No enlarged mediastinal, hilar, or axillary lymph nodes. Thyroid gland, trachea, and esophagus demonstrate no significant findings. Lungs/Pleura: No pneumothorax or pleural effusion is noted. Minimal bibasilar subsegmental atelectasis is noted. Musculoskeletal: No chest wall mass or suspicious bone lesions identified. CT ABDOMEN PELVIS FINDINGS Hepatobiliary: No focal liver abnormality is seen. No gallstones, gallbladder wall thickening, or biliary dilatation. Pancreas: Unremarkable. No pancreatic ductal dilatation or surrounding inflammatory changes. Spleen: Normal in size without focal abnormality. Adrenals/Urinary Tract:  Adrenal glands are unremarkable. Kidneys are normal, without renal calculi, focal lesion, or hydronephrosis. Bladder is unremarkable. Stomach/Bowel: Stomach is within normal limits. Appendix appears normal. Diverticulosis of descending and sigmoid colon is noted. 52 x 9 mm crescent-shaped fluid collection is seen lateral to the descending colon which may represent inflammatory fluid collection from previous inflammation. There is no evidence of bowel obstruction. Vascular/Lymphatic: Aortic atherosclerosis. No enlarged abdominal or pelvic lymph nodes. Reproductive: Stable prostatic calcifications. Other: Small fat containing left inguinal hernia is noted. No ascites is noted. Musculoskeletal: Bilateral pars defects are noted at L5. No acute osseous abnormality is noted. IMPRESSION: No definite evidence of acute traumatic injury involving the chest, abdomen or pelvis. Coronary artery calcifications are noted suggesting coronary artery disease. Diverticulosis of descending and sigmoid colon is noted. 5.2 x 0.9 cm crescent-shaped fluid collection is seen lateral to the descending colon which may represent inflammatory fluid collection from acute or chronic inflammation. Clinical correlation is recommended. Small fat containing left inguinal hernia. Aortic Atherosclerosis (ICD10-I70.0). Electronically Signed   By: Lupita Raider, M.D.   On: 12/31/2017 14:53   Dg Chest Port 1 View  Result Date: 12/15/2017 CLINICAL DATA:  Shortness of breath. Hypoxia. COPD and congestive heart failure. EXAM: PORTABLE CHEST 1 VIEW COMPARISON:  11/28/2017 FINDINGS: Stable mild cardiomegaly. Aortic atherosclerosis. Both lungs are clear. IMPRESSION: Stable mild cardiomegaly.  No active lung disease. Electronically Signed   By: Myles Rosenthal M.D.   On: 12/15/2017 14:49   Dg Knee Complete 4 Views Right  Result Date: 01/02/2018 CLINICAL DATA:  Right knee pain after fall. EXAM: RIGHT KNEE - COMPLETE 4+ VIEW COMPARISON:  Radiographs of Aug 16, 2014. FINDINGS: No evidence of fracture, dislocation, or joint effusion. No evidence of arthropathy or other focal bone abnormality. Soft tissues are unremarkable. IMPRESSION: Normal right knee. Electronically Signed   By: Lupita Raider, M.D.   On: 01/02/2018 09:47       Today   Subjective:   Brian [name] continues to complain of pain in multiple parts of his body Objective:   Blood pressure (!) 147/95, pulse 74, temperature 97.7 F (36.5 C), temperature source Oral, resp. rate 19, height 5\' 5"  (1.651 m),  weight 88.6 kg, SpO2 93 %.  .  Intake/Output Summary (Last 24 hours) at 01/03/2018 1444 Last data filed at 01/03/2018 1300 Gross per 24 hour  Intake 480 ml  Output 3230 ml  Net -2750 ml    Exam VITAL SIGNS: Blood pressure (!) 147/95, pulse 74, temperature 97.7 F (36.5 C), temperature source Oral, resp. rate 19, height 5\' 5"  (1.651 m), weight 88.6 kg, SpO2 93 %.  GENERAL:  62 y.o.-year-old patient lying in the bed with no acute distress.  EYES: Pupils equal, round, reactive to light and accommodation. No scleral icterus. Extraocular muscles intact.  HEENT: Head atraumatic, normocephalic. Oropharynx and nasopharynx clear.  NECK:  Supple, no jugular venous distention. No thyroid enlargement, no tenderness.  LUNGS: Normal breath sounds bilaterally, no wheezing, rales,rhonchi or crepitation. No use of accessory muscles of respiration.  CARDIOVASCULAR: S1, S2 normal. No murmurs, rubs, or gallops.  ABDOMEN: Soft, nontender, nondistended. Bowel sounds present. No organomegaly or mass.  EXTREMITIES: No pedal edema, cyanosis, or clubbing.  NEUROLOGIC: Cranial nerves II through XII are intact. Muscle strength 5/5 in all extremities. Sensation intact. Gait not checked.  PSYCHIATRIC: The patient is alert and oriented x 3.  SKIN: No obvious rash, lesion, or ulcer.   Data Review     CBC w Diff:  Lab Results  Component Value Date   WBC 8.2 12/31/2017   HGB 16.4 12/31/2017   HGB  16.7 10/10/2012   HCT 48.9 12/31/2017   HCT 49.0 10/10/2012   PLT 204 12/31/2017   PLT 149 (L) 10/10/2012   LYMPHOPCT 27 12/15/2017   MONOPCT 8 12/15/2017   EOSPCT 0 12/15/2017   BASOPCT 1 12/15/2017   CMP:  Lab Results  Component Value Date   NA 141 01/02/2018   NA 143 11/08/2017   NA 140 10/10/2012   K 4.5 01/02/2018   K 3.7 10/10/2012   CL 103 01/02/2018   CL 106 10/10/2012   CO2 33 (H) 01/02/2018   CO2 26 10/10/2012   BUN 11 01/02/2018   BUN 18 11/08/2017   BUN 9 10/10/2012   CREATININE 0.49 (L) 01/02/2018   CREATININE 0.96 10/10/2012   PROT 6.9 12/25/2017   PROT 7.0 10/10/2012   ALBUMIN 3.6 12/25/2017   ALBUMIN 3.5 10/10/2012   BILITOT 0.3 12/25/2017   BILITOT 0.2 10/10/2012   ALKPHOS 53 12/25/2017   ALKPHOS 48 (L) 10/10/2012   AST 24 12/25/2017   AST 32 10/10/2012   ALT 40 12/25/2017   ALT 44 10/10/2012  .  Micro Results No results found for this or any previous visit (from the past 240 hour(s)).      Code Status Orders  (From admission, onward)         Start     Ordered   01/01/18 0330  Full code  Continuous     01/01/18 0329        Code Status History    Date Active Date Inactive Code Status Order ID Comments User Context   12/15/2017 1726 12/18/2017 1747 Full Code [id number]  Myrtie Neither, MD ED   11/13/2017 0439 11/16/2017 1312 Full Code [id number]  Eduard Clos, MD ED   11/03/2017 1553 11/06/2017 1712 Full Code [id number]  Lahoma Crocker, MD ED   10/26/2017 2338 10/28/2017 2243 Full Code [id number]  Hugelmeyer, Alexis, DO ED   10/14/2017 2207 10/16/2017 1644 Full Code [id number]  Briscoe Deutscher, MD ED   09/27/2017 1052 10/01/2017 1615 Full Code [id number]  Jonah Blue, MD ED  09/25/2017 0504 09/26/2017 1633 Full Code [id number]  Briscoe Deutscher, MD ED   10/20/2015 0727 10/22/2015 1705 Full Code [id number]  Eduard Clos, MD Inpatient   09/30/2015 2027 10/07/2015 1542 Full Code [id number]  Levie Heritage, DO Inpatient   02/03/2013 1543  02/08/2013 1137 Full Code [id number]  Nanine Means, NP Inpatient   02/03/2013 0347 02/03/2013 1543 Full Code [id number]  Angus Seller, PA-C ED   11/10/2012 2026 11/12/2012 1654 Full Code [id number]  Maretta Bees, MD Inpatient   08/07/2012 0158 08/08/2012 1554 Full Code [id number]  Jaci Carrel, PA-C ED   06/02/2012 2032 06/04/2012 0014 Full Code [id number]  Roxy Horseman, PA-C ED   04/20/2011 0506 04/22/2011 1428 Full Code [id number]  Rollene Rotunda, RN ED   03/22/2011 0600 03/22/2011 2315 Full Code [id number]  Cyndra Numbers, MD ED          Follow-up Information    [name], PA-C. Go on 01/11/2018.   Specialty:  Physician Assistant Why:  For Follow up @10 :10am Contact information: [address] [place] [address] [phone]           Discharge Medications   Allergies as of 01/03/2018   No Known Allergies     Medication List    STOP taking these medications   Omeprazole 20 MG Tbec     TAKE these medications   albuterol 108 (90 Base) MCG/ACT inhaler Commonly known as:  PROVENTIL HFA;VENTOLIN HFA Inhale 2 puffs into the lungs every 6 (six) hours as needed for wheezing or shortness of breath.   carvedilol 6.25 MG tablet Commonly known as:  COREG Take 1 tablet (6.25 mg total) by mouth 2 (two) times daily with a meal.   doxycycline 100 MG tablet Commonly known as:  VIBRA-TABS Take 1 tablet (100 mg total) by mouth 2 (two) times daily for 5 days.   folic acid 1 MG tablet Commonly known as:  FOLVITE Take 1 tablet (1 mg total) by mouth daily.   furosemide 20 MG tablet Commonly known as:  LASIX Take 1 tablet (20 mg total) by mouth daily.   lisinopril 5 MG tablet Commonly known as:  PRINIVIL,ZESTRIL Take 1 tablet (5 mg total) by mouth daily.   mometasone-formoterol 200-5 MCG/ACT Aero Commonly known as:  DULERA Inhale 2 puffs into the lungs 2 (two) times daily.   nicotine 21 mg/24hr patch Commonly known as:  NICODERM CQ - dosed in mg/24 hours Place  1 patch (21 mg total) onto the skin daily.   nitroGLYCERIN 0.4 MG SL tablet Commonly known as:  NITROSTAT Place 1 tablet (0.4 mg total) under the tongue every 5 (five) minutes as needed for chest pain.   oxyCODONE-acetaminophen 5-325 MG tablet Commonly known as:  PERCOCET/ROXICET Take 1 tablet by mouth every 4 (four) hours as needed for severe pain.   traZODone 50 MG tablet Commonly known as:  DESYREL Take 1 tablet (50 mg total) by mouth at bedtime as needed for sleep.          Total Time in preparing paper work, data evaluation and todays exam - 35 minutes  Auburn Bilberry M.D on 01/03/2018 at 2:44 PM [facility]   Office  [phone]

## 2018-01-05 ENCOUNTER — Observation Stay (HOSPITAL_COMMUNITY)
Admission: EM | Admit: 2018-01-05 | Discharge: 2018-01-07 | Disposition: A | Discharge status: Home / Self Care | Payer: Medicaid Other | Attending: Internal Medicine | Admitting: Internal Medicine

## 2018-01-05 ENCOUNTER — Encounter (HOSPITAL_COMMUNITY): Payer: Self-pay

## 2018-01-05 ENCOUNTER — Emergency Department (HOSPITAL_COMMUNITY): Payer: Medicaid Other

## 2018-01-05 DIAGNOSIS — Z9114 Patient's other noncompliance with medication regimen: Secondary | ICD-10-CM | POA: Insufficient documentation

## 2018-01-05 DIAGNOSIS — F17210 Nicotine dependence, cigarettes, uncomplicated: Secondary | ICD-10-CM | POA: Diagnosis not present

## 2018-01-05 DIAGNOSIS — M1990 Unspecified osteoarthritis, unspecified site: Secondary | ICD-10-CM | POA: Diagnosis not present

## 2018-01-05 DIAGNOSIS — F10229 Alcohol dependence with intoxication, unspecified: Secondary | ICD-10-CM | POA: Insufficient documentation

## 2018-01-05 DIAGNOSIS — Z915 Personal history of self-harm: Secondary | ICD-10-CM | POA: Insufficient documentation

## 2018-01-05 DIAGNOSIS — I110 Hypertensive heart disease with heart failure: Secondary | ICD-10-CM | POA: Diagnosis not present

## 2018-01-05 DIAGNOSIS — E876 Hypokalemia: Secondary | ICD-10-CM | POA: Diagnosis present

## 2018-01-05 DIAGNOSIS — J441 Chronic obstructive pulmonary disease with (acute) exacerbation: Principal | ICD-10-CM | POA: Insufficient documentation

## 2018-01-05 DIAGNOSIS — I272 Pulmonary hypertension, unspecified: Secondary | ICD-10-CM | POA: Diagnosis not present

## 2018-01-05 DIAGNOSIS — Z8249 Family history of ischemic heart disease and other diseases of the circulatory system: Secondary | ICD-10-CM | POA: Insufficient documentation

## 2018-01-05 DIAGNOSIS — R0789 Other chest pain: Secondary | ICD-10-CM | POA: Diagnosis present

## 2018-01-05 DIAGNOSIS — I10 Essential (primary) hypertension: Secondary | ICD-10-CM

## 2018-01-05 DIAGNOSIS — I255 Ischemic cardiomyopathy: Secondary | ICD-10-CM | POA: Insufficient documentation

## 2018-01-05 DIAGNOSIS — Z9889 Other specified postprocedural states: Secondary | ICD-10-CM | POA: Insufficient documentation

## 2018-01-05 DIAGNOSIS — F1210 Cannabis abuse, uncomplicated: Secondary | ICD-10-CM | POA: Diagnosis not present

## 2018-01-05 DIAGNOSIS — F1414 Cocaine abuse with cocaine-induced mood disorder: Secondary | ICD-10-CM | POA: Diagnosis not present

## 2018-01-05 DIAGNOSIS — I5043 Acute on chronic combined systolic (congestive) and diastolic (congestive) heart failure: Secondary | ICD-10-CM | POA: Diagnosis present

## 2018-01-05 DIAGNOSIS — M25561 Pain in right knee: Secondary | ICD-10-CM | POA: Diagnosis not present

## 2018-01-05 DIAGNOSIS — F17200 Nicotine dependence, unspecified, uncomplicated: Secondary | ICD-10-CM | POA: Diagnosis present

## 2018-01-05 DIAGNOSIS — Z09 Encounter for follow-up examination after completed treatment for conditions other than malignant neoplasm: Secondary | ICD-10-CM

## 2018-01-05 DIAGNOSIS — Z9119 Patient's noncompliance with other medical treatment and regimen: Secondary | ICD-10-CM | POA: Diagnosis not present

## 2018-01-05 DIAGNOSIS — Z590 Homelessness: Secondary | ICD-10-CM | POA: Insufficient documentation

## 2018-01-05 DIAGNOSIS — Z79899 Other long term (current) drug therapy: Secondary | ICD-10-CM | POA: Diagnosis not present

## 2018-01-05 DIAGNOSIS — R9431 Abnormal electrocardiogram [ECG] [EKG]: Secondary | ICD-10-CM | POA: Diagnosis present

## 2018-01-05 DIAGNOSIS — I5042 Chronic combined systolic (congestive) and diastolic (congestive) heart failure: Secondary | ICD-10-CM | POA: Insufficient documentation

## 2018-01-05 DIAGNOSIS — F1010 Alcohol abuse, uncomplicated: Secondary | ICD-10-CM | POA: Diagnosis present

## 2018-01-05 LAB — CBC
HCT: 49.5 % (ref 39.0–52.0)
Hemoglobin: 15.4 g/dL (ref 13.0–17.0)
MCH: 31.5 pg (ref 26.0–34.0)
MCHC: 31.1 g/dL (ref 30.0–36.0)
MCV: 101.2 fL — ABNORMAL HIGH (ref 78.0–100.0)
PLATELETS: 255 10*3/uL (ref 150–400)
RBC: 4.89 MIL/uL (ref 4.22–5.81)
RDW: 13.3 % (ref 11.5–15.5)
WBC: 8.8 10*3/uL (ref 4.0–10.5)

## 2018-01-05 LAB — BASIC METABOLIC PANEL WITH GFR
Anion gap: 11 (ref 5–15)
BUN: 18 mg/dL (ref 6–20)
CO2: 29 mmol/L (ref 22–32)
Calcium: 9.9 mg/dL (ref 8.9–10.3)
Chloride: 102 mmol/L (ref 98–111)
Creatinine, Ser: 1.08 mg/dL (ref 0.61–1.24)
GFR calc Af Amer: >60 mL/min
GFR calc non Af Amer: >60 mL/min
Glucose, Bld: 133 mg/dL — ABNORMAL HIGH (ref 70–99)
Potassium: 3.3 mmol/L — ABNORMAL LOW (ref 3.5–5.1)
Sodium: 142 mmol/L (ref 135–145)

## 2018-01-05 LAB — PHOSPHORUS: PHOSPHORUS: 4.5 mg/dL (ref 2.5–4.6)

## 2018-01-05 LAB — MAGNESIUM: MAGNESIUM: 1.8 mg/dL (ref 1.7–2.4)

## 2018-01-05 LAB — I-STAT TROPONIN, ED: TROPONIN I, POC: 0.02 ng/mL (ref 0.00–0.08)

## 2018-01-05 LAB — BRAIN NATRIURETIC PEPTIDE: B NATRIURETIC PEPTIDE 5: 457.9 pg/mL — AB (ref 0.0–100.0)

## 2018-01-05 MED ORDER — DOXYCYCLINE HYCLATE 100 MG PO TABS
100.0000 mg | ORAL_TABLET | Freq: Two times a day (BID) | ORAL | Status: DC
  Administered 2018-01-06 – 2018-01-07 (×3): 100 mg via ORAL
  Filled 2018-01-05 (×3): qty 1

## 2018-01-05 MED ORDER — NICOTINE 21 MG/24HR TD PT24
21.0000 mg | MEDICATED_PATCH | Freq: Every day | TRANSDERMAL | Status: DC
  Administered 2018-01-06 – 2018-01-07 (×2): 21 mg via TRANSDERMAL
  Filled 2018-01-05 (×2): qty 1

## 2018-01-05 MED ORDER — LISINOPRIL 5 MG PO TABS
5.0000 mg | ORAL_TABLET | Freq: Every day | ORAL | Status: DC
  Administered 2018-01-06 – 2018-01-07 (×2): 5 mg via ORAL
  Filled 2018-01-05 (×2): qty 1

## 2018-01-05 MED ORDER — NITROGLYCERIN 0.4 MG SL SUBL: 0.4000 mg | SUBLINGUAL_TABLET | SUBLINGUAL | Status: DC | PRN

## 2018-01-05 MED ORDER — OXYCODONE-ACETAMINOPHEN 5-325 MG PO TABS
1.0000 | ORAL_TABLET | Freq: Once | ORAL | Status: AC
  Administered 2018-01-05: 1 via ORAL
  Filled 2018-01-05: qty 1

## 2018-01-05 MED ORDER — ACETAMINOPHEN 650 MG RE SUPP: 650.0000 mg | Freq: Four times a day (QID) | RECTAL | Status: DC | PRN

## 2018-01-05 MED ORDER — FOLIC ACID 1 MG PO TABS
1.0000 mg | ORAL_TABLET | Freq: Every day | ORAL | Status: DC
  Administered 2018-01-06 – 2018-01-07 (×2): 1 mg via ORAL
  Filled 2018-01-05 (×2): qty 1

## 2018-01-05 MED ORDER — HEPARIN SODIUM (PORCINE) 5000 UNIT/ML IJ SOLN
5000.0000 [IU] | Freq: Three times a day (TID) | INTRAMUSCULAR | Status: DC
  Administered 2018-01-06 – 2018-01-07 (×4): 5000 [IU] via SUBCUTANEOUS
  Filled 2018-01-05 (×4): qty 1

## 2018-01-05 MED ORDER — METHYLPREDNISOLONE SODIUM SUCC 125 MG IJ SOLR
125.0000 mg | Freq: Once | INTRAMUSCULAR | Status: AC
  Administered 2018-01-05: 125 mg via INTRAVENOUS
  Filled 2018-01-05: qty 2

## 2018-01-05 MED ORDER — ACETAMINOPHEN 325 MG PO TABS: 650.0000 mg | ORAL_TABLET | Freq: Four times a day (QID) | ORAL | Status: DC | PRN

## 2018-01-05 MED ORDER — DM-GUAIFENESIN ER 30-600 MG PO TB12
1.0000 | ORAL_TABLET | Freq: Two times a day (BID) | ORAL | Status: DC
  Administered 2018-01-05 – 2018-01-07 (×4): 1 via ORAL
  Filled 2018-01-05 (×4): qty 1

## 2018-01-05 MED ORDER — KETOROLAC TROMETHAMINE 30 MG/ML IJ SOLN
30.0000 mg | Freq: Once | INTRAMUSCULAR | Status: AC
  Administered 2018-01-05: 30 mg via INTRAVENOUS
  Filled 2018-01-05: qty 1

## 2018-01-05 MED ORDER — METHYLPREDNISOLONE SODIUM SUCC 125 MG IJ SOLR
125.0000 mg | Freq: Four times a day (QID) | INTRAMUSCULAR | Status: DC
  Administered 2018-01-06 (×2): 125 mg via INTRAVENOUS
  Filled 2018-01-05 (×2): qty 2

## 2018-01-05 MED ORDER — POTASSIUM CHLORIDE CRYS ER 20 MEQ PO TBCR
40.0000 meq | EXTENDED_RELEASE_TABLET | Freq: Once | ORAL | Status: AC
  Administered 2018-01-05: 40 meq via ORAL
  Filled 2018-01-05: qty 2

## 2018-01-05 MED ORDER — OXYCODONE-ACETAMINOPHEN 5-325 MG PO TABS
1.0000 | ORAL_TABLET | ORAL | Status: DC | PRN
  Administered 2018-01-06 – 2018-01-07 (×6): 1 via ORAL
  Filled 2018-01-05 (×6): qty 1

## 2018-01-05 MED ORDER — DOXYCYCLINE HYCLATE 100 MG PO TABS
100.0000 mg | ORAL_TABLET | Freq: Once | ORAL | Status: AC
  Administered 2018-01-05: 100 mg via ORAL
  Filled 2018-01-05: qty 1

## 2018-01-05 MED ORDER — FUROSEMIDE 20 MG PO TABS
20.0000 mg | ORAL_TABLET | Freq: Every day | ORAL | Status: DC
  Administered 2018-01-06 – 2018-01-07 (×2): 20 mg via ORAL
  Filled 2018-01-05 (×2): qty 1

## 2018-01-05 MED ORDER — HEPARIN SODIUM (PORCINE) 5000 UNIT/ML IJ SOLN: 5000.0000 [IU] | Freq: Three times a day (TID) | INTRAMUSCULAR | Status: DC

## 2018-01-05 MED ORDER — ACETAMINOPHEN 325 MG PO TABS: 650.0000 mg | ORAL_TABLET | ORAL | Status: DC | PRN

## 2018-01-05 MED ORDER — CARVEDILOL 6.25 MG PO TABS
6.2500 mg | ORAL_TABLET | Freq: Two times a day (BID) | ORAL | Status: DC
Start: 2018-01-06 — End: 2018-01-07
  Administered 2018-01-06 – 2018-01-07 (×3): 6.25 mg via ORAL
  Filled 2018-01-05 (×3): qty 1

## 2018-01-05 MED ORDER — TRAZODONE HCL 50 MG PO TABS: 50.0000 mg | ORAL_TABLET | Freq: Every evening | ORAL | Status: DC | PRN

## 2018-01-05 MED ORDER — ALBUTEROL SULFATE (2.5 MG/3ML) 0.083% IN NEBU
5.0000 mg | INHALATION_SOLUTION | Freq: Once | RESPIRATORY_TRACT | Status: AC
  Administered 2018-01-05: 5 mg via RESPIRATORY_TRACT
  Filled 2018-01-05: qty 6

## 2018-01-05 MED ORDER — ONDANSETRON HCL 4 MG/2ML IJ SOLN: 4.0000 mg | Freq: Four times a day (QID) | INTRAMUSCULAR | Status: DC | PRN

## 2018-01-05 NOTE — Progress Notes (Signed)
Patient agitated saying he wants to leave because RN stated that he would have to wear a yellow bracelet/socks, and the bed alarm would be on for safety. Patient then asked for something to eat, and has been fine ever since.

## 2018-01-05 NOTE — ED Provider Notes (Signed)
[facility] EMERGENCY DEPARTMENT Provider Note   CSN: [id number] Arrival date & time: 01/05/18  1725     History   Chief Complaint Chief Complaint  Patient presents with  . Shortness of Breath    HPI Brian [name] is a 62 y.o. male.  HPI   62 year old male with PMH treatment for COPD, HFrEF last known EF 30-35%, cocaine abuse, EtOH abuse, 13 ED presentations in the last 3 months who presents with chief complaint of shortness of breath and chest pain.  Patient states that this morning he noted to have increased of cough, sputum production, change of color and sputum.  Patient noted associated chest pain described as left, sharp, moderate, worse with coughing/inspiration, nonradiating, nothing made it better.  Denies unilateral lower extremity swelling, recent surgery, recent travel, or history of cancer.  Patient denies fever he has states that he is homeless and unable to check his temperature regularly.  Patient called EMS who found him hypoxic to 80% on room air.  EMS gave 5 mL of albuterol and placed patient on nasal cannula.  Patient states that he is noncompliant with his COPD medications due to social/financial circumstances.  Of note patient was discharged 2 days ago from [facility] for admission for COPD exacerbation.  Past Medical History:  Diagnosis Date  . Alcohol abuse    Heavy alcohol abuse and homelessness  . Arthritis   . Cardiomyopathy (HCC)   . CHF (congestive heart failure) (HCC)   . Cocaine abuse (HCC)    And crack   . COPD (chronic obstructive pulmonary disease) (HCC)   . Heart failure   . Homelessness   . Noncompliance   . Paroxysmal VT (HCC)    a. first seen 2013 in setting of normal LVEF 55-60%, EF now decreased.  . Suicide attempt (HCC)   . Tobacco abuse     Patient Active Problem List   Diagnosis Date Noted  . Atypical chest pain 01/05/2018  . Hypokalemia 01/05/2018  . Chronic combined systolic and diastolic congestive heart  failure (HCC) 01/05/2018  . Alcohol abuse 01/05/2018  . Pulmonary contusion 01/01/2018  . COPD exacerbation (HCC) 12/15/2017  . Acute respiratory failure with hypoxia (HCC) 11/13/2017  . Goals of care, counseling/discussion   . Palliative care by specialist   . Acute on chronic systolic CHF (congestive heart failure), NYHA class 4 (HCC) 11/03/2017  . Hyperglycemia 11/03/2017  . Acute respiratory failure with hypoxia and hypercapnia (HCC) 10/26/2017  . Acute on chronic respiratory failure with hypoxia (HCC) 09/27/2017  . Tobacco dependence 09/27/2017  . Chronic systolic CHF (congestive heart failure) (HCC) 09/25/2017  . COPD with acute exacerbation (HCC) 09/30/2015  . Substance induced mood disorder (HCC) 02/04/2013  . Polysubstance (excluding opioids) dependence, daily use (HCC) 02/03/2013  . Alcohol dependence (HCC) 02/03/2013  . Gout attack 11/12/2012  . Closed fracture of 5th metacarpal 11/12/2012  . Chest pain 08/27/2012  . Acute exacerbation of chronic obstructive pulmonary disease (COPD) (HCC)   . Homelessness   . Cocaine abuse [facility])     Past Surgical History:  Procedure Laterality Date  . INNER EAR SURGERY          Home Medications    Prior to Admission medications   Medication Sig Start Date End Date Taking? Authorizing Provider  albuterol (PROVENTIL HFA;VENTOLIN HFA) 108 (90 Base) MCG/ACT inhaler Inhale 2 puffs into the lungs every 6 (six) hours as needed for wheezing or shortness of breath. 12/18/17  Yes Gherghe, Costin  M, MD  carvedilol (COREG) 6.25 MG tablet Take 1 tablet (6.25 mg total) by mouth 2 (two) times daily with a meal. Patient not taking: Reported on 01/05/2018 12/18/17 12/13/18  Leatha Gilding, MD  doxycycline (VIBRA-TABS) 100 MG tablet Take 1 tablet (100 mg total) by mouth 2 (two) times daily for 5 days. Patient not taking: Reported on 01/05/2018 01/03/18 01/08/18  Auburn Bilberry, MD  folic acid (FOLVITE) 1 MG tablet Take 1 tablet (1 mg total) by mouth  daily. Patient not taking: Reported on 01/05/2018 11/08/17 05/07/18  [name], PA-C  furosemide (LASIX) 20 MG tablet Take 1 tablet (20 mg total) by mouth daily. Patient not taking: Reported on 01/05/2018 12/18/17   Leatha Gilding, MD  lisinopril (PRINIVIL,ZESTRIL) 5 MG tablet Take 1 tablet (5 mg total) by mouth daily. Patient not taking: Reported on 01/05/2018 11/08/17   [name], PA-C  mometasone-formoterol [facility]) 200-5 MCG/ACT AERO Inhale 2 puffs into the lungs 2 (two) times daily. Patient not taking: Reported on 01/05/2018 12/18/17   Leatha Gilding, MD  nicotine (NICODERM CQ - DOSED IN MG/24 HOURS) 21 mg/24hr patch Place 1 patch (21 mg total) onto the skin daily. Patient not taking: Reported on 01/05/2018 11/08/17   [name], PA-C  nitroGLYCERIN (NITROSTAT) 0.4 MG SL tablet Place 1 tablet (0.4 mg total) under the tongue every 5 (five) minutes as needed for chest pain. Patient not taking: Reported on 01/05/2018 11/08/17   [name], PA-C  oxyCODONE-acetaminophen (PERCOCET) 5-325 MG tablet Take 1 tablet by mouth every 4 (four) hours as needed for severe pain. Patient not taking: Reported on 01/05/2018 01/03/18 01/03/19  Auburn Bilberry, MD  traZODone (DESYREL) 50 MG tablet Take 1 tablet (50 mg total) by mouth at bedtime as needed for sleep. Patient not taking: Reported on 01/05/2018 11/16/17   Zannie Cove, MD    Family History Family History  Problem Relation Age of Onset  . Coronary artery disease Unknown   . Diabetes Unknown   . Cancer Sister     Social History Social History   Tobacco Use  . Smoking status: Current Every Day Smoker    Packs/day: 2.00    Types: Cigarettes  . Smokeless tobacco: Never Used  Substance Use Topics  . Alcohol use: Yes    Comment: Heavy. 5-10 40oz daily  . Drug use: No    Types: Marijuana, Cocaine    Comment: Marijuana and cocaine, crack     Allergies   Patient has no known allergies.   Review of Systems Review  of Systems  Constitutional: Negative for chills and fever.  HENT: Negative for ear pain and sore throat.   Eyes: Negative for pain and visual disturbance.  Respiratory: Positive for cough and shortness of breath.   Cardiovascular: Positive for chest pain. Negative for palpitations and leg swelling.  Gastrointestinal: Negative for abdominal pain and vomiting.  Genitourinary: Negative for dysuria and hematuria.  Musculoskeletal: Negative for arthralgias and back pain.  Skin: Negative for color change and rash.  Neurological: Negative for seizures and syncope.  All other systems reviewed and are negative.    Physical Exam Updated Vital Signs BP (!) 159/92 (BP Location: Right Arm)   Pulse 96   Temp 98.4 F (36.9 C) (Oral)   Resp (!) 32   Ht 5\' 5"  (1.651 m)   Wt 88 kg Comment: scale a  SpO2 95%   BMI 32.30 kg/m   Physical Exam  Constitutional: He appears well-developed  and well-nourished.  Non-toxic appearance. He does not appear ill. No distress.  Disheveled.  HENT:  Head: Normocephalic and atraumatic.  Eyes: Conjunctivae are normal.  Neck: Neck supple.  Cardiovascular: Normal rate and regular rhythm.  No murmur heard. Pulmonary/Chest: Effort normal. No accessory muscle usage. Tachypnea noted. No respiratory distress. He has decreased breath sounds in the right middle field and the right lower field. He has rhonchi in the right middle field, the right lower field, the left middle field and the left lower field. He has rales in the right middle field, the right lower field, the left middle field and the left lower field.  Abdominal: Soft. There is no tenderness.  Musculoskeletal: He exhibits no edema.  Neurological: He is alert.  Skin: Skin is warm and dry. Capillary refill takes less than 2 seconds.  Psychiatric: He has a normal mood and affect.  Nursing note and vitals reviewed.    ED Treatments / Results  Labs (all labs ordered are listed, but only abnormal results are  displayed) Labs Reviewed  BASIC METABOLIC PANEL - Abnormal; Notable for the following components:      Result Value   Potassium 3.3 (*)    Glucose, Bld 133 (*)    All other components within normal limits  CBC - Abnormal; Notable for the following components:   MCV 101.2 (*)    All other components within normal limits  BRAIN NATRIURETIC PEPTIDE - Abnormal; Notable for the following components:   B Natriuretic Peptide 457.9 (*)    All other components within normal limits  MAGNESIUM  PHOSPHORUS  TROPONIN I  TROPONIN I  COMPREHENSIVE METABOLIC PANEL  CBC  I-STAT TROPONIN, ED    EKG EKG Interpretation  Date/Time:  Saturday January 05 2018 17:35:34 EDT Ventricular Rate:  116 PR Interval:    QRS Duration: 86 QT Interval:  334 QTC Calculation: 464 R Axis:   68 Text Interpretation:  Sinus tachycardia LAE, consider biatrial enlargement Borderline repolarization abnormality Confirmed by Blane Ohara [phone]) on 01/05/2018 6:20:34 PM   Radiology Dg Chest 2 View  Result Date: 01/05/2018 CLINICAL DATA:  Shortness of breath.  Chest pain. EXAM: CHEST - 2 VIEW COMPARISON:  Radiographs of December 31, 2017. FINDINGS: The heart size and mediastinal contours are within normal limits. No pneumothorax or pleural effusion is noted. Right lung is clear. Mild left basilar subsegmental atelectasis or infiltrate is noted. The visualized skeletal structures are unremarkable. IMPRESSION: Mild left basilar subsegmental atelectasis or infiltrate. Electronically Signed   By: Lupita Raider, M.D.   On: 01/05/2018 19:03    Procedures Procedures (including critical care time)  Medications Ordered in ED Medications  dextromethorphan-guaiFENesin (MUCINEX DM) 30-600 MG per 12 hr tablet 1 tablet (1 tablet Oral Given 01/05/18 2015)  traZODone (DESYREL) tablet 50 mg (has no administration in time range)  oxyCODONE-acetaminophen (PERCOCET/ROXICET) 5-325 MG per tablet 1 tablet (has no administration in  time range)  nitroGLYCERIN (NITROSTAT) SL tablet 0.4 mg (has no administration in time range)  nicotine (NICODERM CQ - dosed in mg/24 hours) patch 21 mg (has no administration in time range)  lisinopril (PRINIVIL,ZESTRIL) tablet 5 mg (has no administration in time range)  furosemide (LASIX) tablet 20 mg (has no administration in time range)  folic acid (FOLVITE) tablet 1 mg (has no administration in time range)  doxycycline (VIBRA-TABS) tablet 100 mg (0 mg Oral Duplicate 01/06/18 0008)  carvedilol (COREG) tablet 6.25 mg (has no administration in time range)  acetaminophen (TYLENOL) tablet 650 mg (  has no administration in time range)    Or  acetaminophen (TYLENOL) suppository 650 mg (has no administration in time range)  methylPREDNISolone sodium succinate (SOLU-MEDROL) 125 mg/2 mL injection 125 mg (has no administration in time range)  ondansetron (ZOFRAN) injection 4 mg (has no administration in time range)  heparin injection 5,000 Units (5,000 Units Subcutaneous Not Given 01/05/18 2232)  ipratropium-albuterol (DUONEB) 0.5-2.5 (3) MG/3ML nebulizer solution 3 mL (has no administration in time range)  albuterol (PROVENTIL) (2.5 MG/3ML) 0.083% nebulizer solution 2.5 mg (has no administration in time range)  magnesium sulfate IVPB 2 g 50 mL (has no administration in time range)  albuterol (PROVENTIL) (2.5 MG/3ML) 0.083% nebulizer solution 5 mg (5 mg Nebulization Given 01/05/18 1813)  methylPREDNISolone sodium succinate (SOLU-MEDROL) 125 mg/2 mL injection 125 mg (125 mg Intravenous Given 01/05/18 2003)  doxycycline (VIBRA-TABS) tablet 100 mg (100 mg Oral Given 01/05/18 1944)  ketorolac (TORADOL) 30 MG/ML injection 30 mg (30 mg Intravenous Given 01/05/18 2015)  oxyCODONE-acetaminophen (PERCOCET/ROXICET) 5-325 MG per tablet 1 tablet (1 tablet Oral Given 01/05/18 2015)  potassium chloride SA (K-DUR,KLOR-CON) CR tablet 40 mEq (40 mEq Oral Given 01/05/18 2200)     Initial Impression / Assessment and Plan /  ED Course  I have reviewed the triage vital signs and the nursing notes.  Pertinent labs & imaging results that were available during my care of the patient were reviewed by me and considered in my medical decision making (see chart for details).     32-year-old male with PMH treatment for COPD, HFrEF last known EF 30-35%, cocaine abuse, EtOH abuse, 13 ED presentations in the last 3 months who presents with chief complaint of shortness of breath and chest pain.  History as above.  DDX includes COPD exacerbation, CHF exacerbation, ACS, PE, or pneumonia.  Patient afebrile, 3 L O2 requirement.  Normotensive.  Patient given butyryl neb.  Labs and imaging performed.  Chest x-ray read as atelectasis versus pneumonia left lower lobe.  No evidence of pulmonary edema.  Patient with no significant lab abnormalities at this time.  BNP 457.  Troponin negative.  EKG similar to priors.  Doubt PE due to low risk Wells, PERC positive due to age and tachypnea.  Patient without DVT risk factors.  History of overwhelmingly consistent with COPD exacerbation.  Will not pursue CT PE study at this time.  Patient with likely COPD exacerbation with oxygen patient given Solu-Medrol and doxycycline.  Patient admitted to hospitalist for further management of care.  Final Clinical Impressions(s) / ED Diagnoses   Final diagnoses:  None    ED Discharge Orders    None       Margit Banda, MD 01/06/18 1610    Blane Ohara, MD 01/06/18 [phone]

## 2018-01-05 NOTE — ED Notes (Signed)
Patient transported to X-ray 

## 2018-01-05 NOTE — H&P (Signed)
History and Physical    Brian GOODELL UEA:[id number] DOB: 11-14-1955 DOA: 01/05/2018  PCP: [name], PA-C   Patient coming from: Homeless.  I have personally briefly reviewed patient's old medical records in [facility]  Chief Complaint: Chest pain and shortness of breath.  HPI: Brian [name] is a 62 y.o. male with medical history significant of alcohol abuse, tobacco abuse, substance abuse, osteoarthritis, CHF, ischemic cardiomyopathy, chronic combined systolic and diastolic CHF, history of cocaine and crack abuse, COPD, homelessness, noncompliance with treatment, depression, history of suicide attempt who was discharged 2 days ago from [facility] due to COPD exacerbation, alcohol intoxication, pulmonary contusion and laceration to the neck and left arm after falling in unspecified height from a bridge.  He is not a very good historian, but denies fever, chills, headache, sore throat, but complains of wheezing, pleuritic chest pain, cough which is often productive and fatigue.  He denies hemoptysis.  No dizziness, palpitations, diaphoresis, PND, orthopnea or pitting edema of the lower extremities.  He denies abdominal pain, nausea, emesis, diarrhea, constipation, melena or hematochezia. No dysuria, frequency or hematuria.  Denies heat or cold intolerance.  No polyuria, polydipsia, polyphagia or blurred vision.  ED Course: Initial vital signs temperature 99.4 F, pulse 100, respirations 32, blood pressure 115/87 mmHg and O2 sat 100% on nonrebreather mask.  Besides supplemental oxygen the patient received a 5 mg albuterol neb treatment, Solu-Medrol 125 mg IVP x1, doxycycline 100 mg p.o. x140 mEq of potassium p.o. x1.  His lab work shows a normal CBC except for high MCV at 101.2.  BMP showed a potassium of 3.3 mmol/L and a glucose of 133 mg/dL.  All other values were normal.  BNP was 457.9 pg/mL.  Troponin was normal.  EKG shows sinus tachycardia with LAE.  His chest radiograph showed  mild left basilar subsegmental atelectasis or infiltrate.  Please see image and full radiology report for further detail.  Review of Systems: As per HPI otherwise 10 point review of systems negative.   Past Medical History:  Diagnosis Date  . Alcohol abuse    Heavy alcohol abuse and homelessness  . Arthritis   . Cardiomyopathy (HCC)   . CHF (congestive heart failure) (HCC)   . Cocaine abuse (HCC)    And crack   . COPD (chronic obstructive pulmonary disease) (HCC)   . Heart failure   . Homelessness   . Noncompliance   . Paroxysmal VT (HCC)    a. first seen 2013 in setting of normal LVEF 55-60%, EF now decreased.  . Suicide attempt (HCC)   . Tobacco abuse     Past Surgical History:  Procedure Laterality Date  . INNER EAR SURGERY       reports that he has been smoking cigarettes. He has been smoking about 2.00 packs per day. He has never used smokeless tobacco. He reports that he drinks alcohol. He reports that he does not use drugs.  No Known Allergies  Family History  Problem Relation Age of Onset  . Coronary artery disease Unknown   . Diabetes Unknown   . Cancer Sister    Prior to Admission medications   Medication Sig Start Date End Date Taking? Authorizing Provider  albuterol (PROVENTIL HFA;VENTOLIN HFA) 108 (90 Base) MCG/ACT inhaler Inhale 2 puffs into the lungs every 6 (six) hours as needed for wheezing or shortness of breath. 12/18/17  Yes Leatha Gilding, MD  carvedilol (COREG) 6.25 MG tablet Take 1 tablet (6.25 mg total) by  mouth 2 (two) times daily with a meal. Patient not taking: Reported on 01/05/2018 12/18/17 12/13/18  Leatha Gilding, MD  doxycycline (VIBRA-TABS) 100 MG tablet Take 1 tablet (100 mg total) by mouth 2 (two) times daily for 5 days. Patient not taking: Reported on 01/05/2018 01/03/18 01/08/18  Auburn Bilberry, MD  folic acid (FOLVITE) 1 MG tablet Take 1 tablet (1 mg total) by mouth daily. Patient not taking: Reported on 01/05/2018 11/08/17 05/07/18   [name], PA-C  furosemide (LASIX) 20 MG tablet Take 1 tablet (20 mg total) by mouth daily. Patient not taking: Reported on 01/05/2018 12/18/17   Leatha Gilding, MD  lisinopril (PRINIVIL,ZESTRIL) 5 MG tablet Take 1 tablet (5 mg total) by mouth daily. Patient not taking: Reported on 01/05/2018 11/08/17   [name], PA-C  mometasone-formoterol [facility]) 200-5 MCG/ACT AERO Inhale 2 puffs into the lungs 2 (two) times daily. Patient not taking: Reported on 01/05/2018 12/18/17   Leatha Gilding, MD  nicotine (NICODERM CQ - DOSED IN MG/24 HOURS) 21 mg/24hr patch Place 1 patch (21 mg total) onto the skin daily. Patient not taking: Reported on 01/05/2018 11/08/17   [name], PA-C  nitroGLYCERIN (NITROSTAT) 0.4 MG SL tablet Place 1 tablet (0.4 mg total) under the tongue every 5 (five) minutes as needed for chest pain. Patient not taking: Reported on 01/05/2018 11/08/17   [name], PA-C  oxyCODONE-acetaminophen (PERCOCET) 5-325 MG tablet Take 1 tablet by mouth every 4 (four) hours as needed for severe pain. Patient not taking: Reported on 01/05/2018 01/03/18 01/03/19  Auburn Bilberry, MD  traZODone (DESYREL) 50 MG tablet Take 1 tablet (50 mg total) by mouth at bedtime as needed for sleep. Patient not taking: Reported on 01/05/2018 11/16/17   Zannie Cove, MD    Physical Exam: Vitals:   01/05/18 1744 01/05/18 1745 01/05/18 1900 01/05/18 2015  BP: 115/87 (!) 100/58 112/67 108/79  Pulse: 100 (!) 111 (!) 104 (!) 104  Resp: (!) 32 (!) 25 (!) 29 (!) 26  Temp: 99.4 F (37.4 C)     TempSrc: Oral     SpO2: 100% 99% 91% 94%  Weight:      Height:        Constitutional: NAD, calm, comfortable Eyes: PERRL, lids and conjunctivae normal ENMT: Mucous membranes are moist. Posterior pharynx clear of any exudate or lesions. Poor state of repair of dentition.  Neck: normal, supple, no masses, no thyromegaly Respiratory: Decreased breath sounds bilaterally with wheezing and mild  rhonchi, no crackles. Normal respiratory effort. No accessory muscle use.  Cardiovascular: Tachycardic at 104 bpm, no murmurs / rubs / gallops. No extremity edema. 2+ pedal pulses. No carotid bruits.  Abdomen: Soft, no tenderness, no masses palpated. No hepatosplenomegaly. Bowel sounds positive.  Musculoskeletal: no clubbing / cyanosis. Good ROM, no contractures. Normal muscle tone.  Skin: Several areas of aberrations on extremities. Neurologic: CN 2-12 grossly intact. Sensation intact, DTR normal. Strength 5/5 in all 4.  Psychiatric: Normal judgment and insight. Alert and oriented x 3. Normal mood.    Labs on Admission: I have personally reviewed following labs and imaging studies  CBC: Recent Labs  Lab 12/31/17 1249 01/05/18 1740  WBC 8.2 8.8  HGB 16.4 15.4  HCT 48.9 49.5  MCV 98.7 101.2*  PLT 204 255   Basic Metabolic Panel: Recent Labs  Lab 12/31/17 1249 01/02/18 0520 01/05/18 1740  NA 138 141 142  K 4.0 4.5 3.3*  CL 103 103 102  CO2  29 33* 29  GLUCOSE 86 114* 133*  BUN 13 11 18   CREATININE 0.70 0.49* 1.08  CALCIUM 8.7* 8.5* 9.9   GFR: Estimated Creatinine Clearance: 75.3 mL/min (by C-G formula based on SCr of 1.08 mg/dL). Liver Function Tests: No results for input(s): AST, ALT, ALKPHOS, BILITOT, PROT, ALBUMIN in the last 168 hours. No results for input(s): LIPASE, AMYLASE in the last 168 hours. No results for input(s): AMMONIA in the last 168 hours. Coagulation Profile: No results for input(s): INR, PROTIME in the last 168 hours. Cardiac Enzymes: Recent Labs  Lab 12/31/17 2004 12/31/17 2218 01/01/18 0357 01/01/18 0925 01/01/18 1511  TROPONINI 0.04* 0.04* 0.03* 0.03* 0.03*   BNP (last 3 results) No results for input(s): PROBNP in the last 8760 hours. HbA1C: No results for input(s): HGBA1C in the last 72 hours. CBG: No results for input(s): GLUCAP in the last 168 hours. Lipid Profile: No results for input(s): CHOL, HDL, LDLCALC, TRIG, CHOLHDL,  LDLDIRECT in the last 72 hours. Thyroid Function Tests: No results for input(s): TSH, T4TOTAL, FREET4, T3FREE, THYROIDAB in the last 72 hours. Anemia Panel: No results for input(s): VITAMINB12, FOLATE, FERRITIN, TIBC, IRON, RETICCTPCT in the last 72 hours. Urine analysis:    Component Value Date/Time   COLORURINE YELLOW (A) 12/25/2017 1309   APPEARANCEUR CLEAR (A) 12/25/2017 1309   APPEARANCEUR Clear 10/10/2012 0320   LABSPEC 1.013 12/25/2017 1309   LABSPEC 1.009 10/10/2012 0320   PHURINE 5.0 12/25/2017 1309   GLUCOSEU NEGATIVE 12/25/2017 1309   GLUCOSEU Negative 10/10/2012 0320   HGBUR NEGATIVE 12/25/2017 1309   BILIRUBINUR NEGATIVE 12/25/2017 1309   BILIRUBINUR Negative 10/10/2012 0320   KETONESUR NEGATIVE 12/25/2017 1309   PROTEINUR NEGATIVE 12/25/2017 1309   UROBILINOGEN 0.2 08/27/2012 0659   NITRITE NEGATIVE 12/25/2017 1309   LEUKOCYTESUR NEGATIVE 12/25/2017 1309   LEUKOCYTESUR Negative 10/10/2012 0320    Radiological Exams on Admission: Dg Chest 2 View  Result Date: 01/05/2018 CLINICAL DATA:  Shortness of breath.  Chest pain. EXAM: CHEST - 2 VIEW COMPARISON:  Radiographs of December 31, 2017. FINDINGS: The heart size and mediastinal contours are within normal limits. No pneumothorax or pleural effusion is noted. Right lung is clear. Mild left basilar subsegmental atelectasis or infiltrate is noted. The visualized skeletal structures are unremarkable. IMPRESSION: Mild left basilar subsegmental atelectasis or infiltrate. Electronically Signed   By: Lupita Raider, M.D.   On: 01/05/2018 19:03   09/27/2017 echo  ------------------------------------------------------------------- LV EF: 30% -   35%  ------------------------------------------------------------------- Indications:      CHF - 428.0.  ------------------------------------------------------------------- Study Conclusions  - Left ventricle: The cavity size was mildly dilated. Wall   thickness was normal.  Systolic function was moderately to   severely reduced. The estimated ejection fraction was in the   range of 30% to 35%. Diffuse hypokinesis. Features are consistent   with a pseudonormal left ventricular filling pattern, with   concomitant abnormal relaxation and increased filling pressure   (grade 2 diastolic dysfunction). - Aortic valve: There was no stenosis. - Mitral valve: There was trivial regurgitation. - Left atrium: The atrium was mildly dilated. - Right ventricle: The cavity size was mildly dilated. Systolic   function was normal. - Right atrium: The atrium was mildly to moderately dilated. - Tricuspid valve: Peak RV-RA gradient (S): 40 mm Hg. - Pulmonary arteries: PA peak pressure: 48 mm Hg (S). - Systemic veins: IVC measured 2.5 cm with > 50% respirophasic   variation, suggesting RA pressure 8 mmHg. - Pericardium, extracardiac:  A trivial pericardial effusion was   identified.  Impressions:  - Mildly dilated LV with EF 30-35%, diffuse hypokinesis. Mildly   dilated RV with normal systolic function. No significant valvular   abnormalities. Mild pulmonary hypertension.  EKG: Independently reviewed. Vent. rate 116 BPM PR interval * ms QRS duration 86 ms QT/QTc 334/464 ms P-R-T axes 63 68 211 Sinus tachycardia LAE, consider biatrial enlargement Borderline repolarization abnormality  Assessment/Plan Principal Problem:   COPD with acute exacerbation (HCC) Observation/telemetry. Continue supplemental oxygen. Continue schedule and as needed bronchodilators.   Continue Solu-Medrol. Resume doxycycline.  Active Problems:   Tobacco dependence Tobacco cessation counseling provided. Nicotine replacement therapy ordered. Tobacco cessation information to be provided by the staff    Atypical chest pain Trend troponin levels. Check EKG in a.m.    Chronic combined systolic and diastolic congestive heart failure (HCC) No signs of decompensation at this time. His  dyspnea and other symptoms are due to his COPD.    Hypokalemia Replacing.    Alcohol abuse Per patient, he says drinking 1 or 2 beers a day. He was recently at [facility] and was not consuming alcohol while admitted. In any case, monitor for signs of DTs.    DVT prophylaxis: Heparin SQ. Code Status: Full code. Family Communication: None at bedside. Disposition Plan: Observation for troponin level trending and COPD exacerbation treatment. Consults called: Admission status: Observation/telemetry.   Bobette Mo MD [facility] Pager [phone].  If 7PM-7AM, please contact night-coverage [email or web address] Password [facility]  01/05/2018, 9:11 PM

## 2018-01-05 NOTE — ED Notes (Signed)
Pt given beverage per Dr. Jodi Mourning

## 2018-01-05 NOTE — ED Triage Notes (Signed)
Pt c/o sob all day, cp 20 min prior to ems arrival; hx CHF, COPD; not compliant w/ medications, homeless; given 5 albuterol PTA, rales upper, decreased lower; on 10L O2 NRB; CP increases w/ cough, inspiration, expiration; given 324 ASA PTA; BP 104/60, held nitro; cough for several weeks  106/60 HR 110 RR 24 CBG 147 88% RA; 98% NRB

## 2018-01-05 NOTE — Progress Notes (Signed)
Patient arrived to unit, says he's feeling better and chest pain is better (although he says it's a 8/10), not requesting anything for it. Cardiac monitor initiated and verified.

## 2018-01-06 ENCOUNTER — Other Ambulatory Visit: Payer: Self-pay

## 2018-01-06 DIAGNOSIS — I272 Pulmonary hypertension, unspecified: Secondary | ICD-10-CM | POA: Diagnosis not present

## 2018-01-06 DIAGNOSIS — M25561 Pain in right knee: Secondary | ICD-10-CM | POA: Diagnosis present

## 2018-01-06 DIAGNOSIS — E876 Hypokalemia: Secondary | ICD-10-CM | POA: Diagnosis not present

## 2018-01-06 DIAGNOSIS — I110 Hypertensive heart disease with heart failure: Secondary | ICD-10-CM | POA: Diagnosis not present

## 2018-01-06 DIAGNOSIS — J441 Chronic obstructive pulmonary disease with (acute) exacerbation: Secondary | ICD-10-CM | POA: Diagnosis not present

## 2018-01-06 DIAGNOSIS — R9431 Abnormal electrocardiogram [ECG] [EKG]: Secondary | ICD-10-CM | POA: Diagnosis present

## 2018-01-06 LAB — COMPREHENSIVE METABOLIC PANEL
ALBUMIN: 3.2 g/dL — AB (ref 3.5–5.0)
ALT: 28 U/L (ref 0–44)
AST: 24 U/L (ref 15–41)
Alkaline Phosphatase: 47 U/L (ref 38–126)
Anion gap: 10 (ref 5–15)
BUN: 25 mg/dL — AB (ref 6–20)
CHLORIDE: 105 mmol/L (ref 98–111)
CO2: 28 mmol/L (ref 22–32)
Calcium: 9.6 mg/dL (ref 8.9–10.3)
Creatinine, Ser: 0.86 mg/dL (ref 0.61–1.24)
GFR calc Af Amer: >60 mL/min (ref >60)
GFR calc non Af Amer: >60 mL/min (ref >60)
GLUCOSE: 171 mg/dL — AB (ref 70–99)
POTASSIUM: 4.9 mmol/L (ref 3.5–5.1)
Sodium: 143 mmol/L (ref 135–145)
Total Bilirubin: 0.4 mg/dL (ref 0.3–1.2)
Total Protein: 6.6 g/dL (ref 6.5–8.1)

## 2018-01-06 LAB — TROPONIN I: Troponin I: 0.03 ng/mL (ref <0.03)

## 2018-01-06 LAB — CBC
HEMATOCRIT: 49.2 % (ref 39.0–52.0)
Hemoglobin: 15.3 g/dL (ref 13.0–17.0)
MCH: 31.5 pg (ref 26.0–34.0)
MCHC: 31.1 g/dL (ref 30.0–36.0)
MCV: 101.2 fL — AB (ref 78.0–100.0)
Platelets: 238 10*3/uL (ref 150–400)
RBC: 4.86 MIL/uL (ref 4.22–5.81)
RDW: 13.1 % (ref 11.5–15.5)
WBC: 6 10*3/uL (ref 4.0–10.5)

## 2018-01-06 LAB — RAPID URINE DRUG SCREEN, HOSP PERFORMED
Amphetamines: NOT DETECTED
BENZODIAZEPINES: NOT DETECTED
Barbiturates: NOT DETECTED
COCAINE: POSITIVE — AB
Opiates: NOT DETECTED
TETRAHYDROCANNABINOL: POSITIVE — AB

## 2018-01-06 MED ORDER — MAGNESIUM SULFATE 2 GM/50ML IV SOLN
2.0000 g | Freq: Once | INTRAVENOUS | Status: AC
  Administered 2018-01-06: 2 g via INTRAVENOUS
  Filled 2018-01-06: qty 50

## 2018-01-06 MED ORDER — THIAMINE HCL 100 MG/ML IJ SOLN
100.0000 mg | Freq: Every day | INTRAMUSCULAR | Status: DC
  Filled 2018-01-06: qty 2

## 2018-01-06 MED ORDER — METHYLPREDNISOLONE SODIUM SUCC 125 MG IJ SOLR
60.0000 mg | Freq: Four times a day (QID) | INTRAMUSCULAR | Status: AC
  Administered 2018-01-06 (×2): 60 mg via INTRAVENOUS
  Filled 2018-01-06 (×2): qty 2

## 2018-01-06 MED ORDER — ALBUTEROL SULFATE (2.5 MG/3ML) 0.083% IN NEBU: 2.5000 mg | INHALATION_SOLUTION | RESPIRATORY_TRACT | Status: DC | PRN

## 2018-01-06 MED ORDER — LORAZEPAM 1 MG PO TABS: 1.0000 mg | ORAL_TABLET | Freq: Four times a day (QID) | ORAL | Status: DC | PRN

## 2018-01-06 MED ORDER — ADULT MULTIVITAMIN W/MINERALS CH
1.0000 | ORAL_TABLET | Freq: Every day | ORAL | Status: DC
  Administered 2018-01-06 – 2018-01-07 (×2): 1 via ORAL
  Filled 2018-01-06 (×2): qty 1

## 2018-01-06 MED ORDER — SODIUM CHLORIDE 0.9 % IV SOLN
INTRAVENOUS | Status: DC | PRN
  Administered 2018-01-06: 250 mL via INTRAVENOUS

## 2018-01-06 MED ORDER — FOLIC ACID 1 MG PO TABS: 1.0000 mg | ORAL_TABLET | Freq: Every day | ORAL | Status: DC

## 2018-01-06 MED ORDER — LORAZEPAM 2 MG/ML IJ SOLN: 1.0000 mg | Freq: Four times a day (QID) | INTRAMUSCULAR | Status: DC | PRN

## 2018-01-06 MED ORDER — IPRATROPIUM-ALBUTEROL 0.5-2.5 (3) MG/3ML IN SOLN
3.0000 mL | Freq: Four times a day (QID) | RESPIRATORY_TRACT | Status: DC
  Administered 2018-01-06 – 2018-01-07 (×6): 3 mL via RESPIRATORY_TRACT
  Filled 2018-01-06 (×6): qty 3

## 2018-01-06 MED ORDER — VITAMIN B-1 100 MG PO TABS
100.0000 mg | ORAL_TABLET | Freq: Every day | ORAL | Status: DC
  Administered 2018-01-06 – 2018-01-07 (×2): 100 mg via ORAL
  Filled 2018-01-06 (×2): qty 1

## 2018-01-06 NOTE — Progress Notes (Signed)
Physical Therapy Evaluation Patient Details Name: Brian [name] MRN: [id number] DOB: [birth year] Today's Date: 01/06/2018   History of Present Illness  Brian [name] is a 62 y.o. male admitted with COPD with acute exacerbation Pt with PHMx: of ETOH abuse, tobacco abuse, substance abuse, osteoarthritis, CHF, ischemic cardiomyopathy, chronic combined systolic and diastolic CHF, history of cocaine and crack abuse, COPD, homelessness, noncompliance with treatment, depression, history of suicide attempt who was discharged 2 days ago from [facility] due to COPD exacerbation, alcohol intoxication, pulmonary contusion and laceration to the neck and left arm after falling in unspecified height down a manhole.   Clinical Impression  Pt admitted with above diagnosis. Pt currently with functional limitations due to the deficits listed below (see PT Problem List). Patient primarily limited in mobility due to rt knee pain from recent fall (from unknown height, down manhole--per pt). Discussed with Dr. Benjamine Mola ?medial collateral ligament sprain/strain/tear and ?medial meniscus tear. Agree with plan for MRI, knee brace (ideally hinged knee brace for support but allows knee flexion and ease of mobility), and single point cane. Pt will benefit from skilled PT to increase their independence and safety with mobility to allow discharge to the venue listed below.       Follow Up Recommendations No PT follow up    Equipment Recommendations  Cane ; hinged knee brace   Recommendations for Other Services       Precautions / Restrictions Precautions Precautions: Fall Restrictions Weight Bearing Restrictions: No      Mobility  Bed Mobility Overal bed mobility: Modified Independent             General bed mobility comments: increased time due to right knee to thigh pain  Transfers Overall transfer level: Modified independent Equipment used: None                Ambulation/Gait Ambulation/Gait  assistance: Modified independent (Device/Increase time) Gait Distance (Feet): 75 Feet Assistive device: 1 person hand held assist(simulating use of cane; pt refuses RW due to homeless) Gait Pattern/deviations: Step-to pattern;Decreased step length - left;Decreased stance time - right;Decreased weight shift to right;Antalgic;Wide base of support   Gait velocity interpretation: <1.8 ft/sec, indicate of risk for recurrent falls    Stairs            Wheelchair Mobility    Modified Rankin (Stroke Patients Only)       Balance Overall balance assessment: Modified Independent(balance deficits due to pain in right LE (thigh to knee))                                           Pertinent Vitals/Pain Pain Assessment: 0-10 Pain Score: 9  Pain Location: rt knee Pain Descriptors / Indicators: Jabbing Pain Intervention(s): Limited activity within patient's tolerance;Patient requesting pain meds-RN notified;RN gave pain meds during session;Ice applied    Home Living Family/patient expects to be discharged to:: Shelter/Homeless                      Prior Function Level of Independence: Independent               Hand Dominance   Dominant Hand: Right    Extremity/Trunk Assessment   Upper Extremity Assessment Upper Extremity Assessment: Defer to OT evaluation    Lower Extremity Assessment Lower Extremity Assessment: RLE deficits/detail RLE Deficits / Details: limited wt-bearing due to  pain; strongly + medial collateral ligament test; reports one episode of locking PTA RLE: Unable to fully assess due to pain       Communication   Communication: No difficulties  Cognition Arousal/Alertness: Awake/alert Behavior During Therapy: WFL for tasks assessed/performed Overall Cognitive Status: Within Functional Limits for tasks assessed                                        General Comments General comments (skin integrity, edema,  etc.): Discussed knee pain with Dr. Benjamine Mola     Exercises     Assessment/Plan    PT Assessment Patient needs continued PT services  PT Problem List Decreased range of motion;Decreased activity tolerance;Decreased balance;Decreased mobility;Decreased knowledge of use of DME;Pain       PT Treatment Interventions Gait training;DME instruction;Stair training;Functional mobility training;Therapeutic activities;Patient/family education    PT Goals (Current goals can be found in the Care Plan section)  Acute Rehab PT Goals Patient Stated Goal: get leg pain better PT Goal Formulation: With patient Time For Goal Achievement: 01/13/18 Potential to Achieve Goals: Good    Frequency Min 3X/week   Barriers to discharge Other (comment)(homeless)      Co-evaluation PT/OT/SLP Co-Evaluation/Treatment: Yes Reason for Co-Treatment: To address functional/ADL transfers;Necessary to address cognition/behavior during functional activity(initially refusing to participate; ) PT goals addressed during session: Mobility/safety with mobility OT goals addressed during session: ADL's and self-care;Strengthening/ROM       AM-PAC PT "6 Clicks" Daily Activity  Outcome Measure Difficulty turning over in bed (including adjusting bedclothes, sheets and blankets)?: None Difficulty moving from lying on back to sitting on the side of the bed? : None Difficulty sitting down on and standing up from a chair with arms (e.g., wheelchair, bedside commode, etc,.)?: None Help needed moving to and from a bed to chair (including a wheelchair)?: None Help needed walking in hospital room?: A Little Help needed climbing 3-5 steps with a railing? : A Little 6 Click Score: 22    End of Session Equipment Utilized During Treatment: (pt refused gait belt; ) Activity Tolerance: Patient limited by pain(see vitals flow sheet; tolerated on room air) Patient left: in bed;with call bell/phone within reach(refuses bed alarm (RN aware);  ) Nurse Communication: Patient requests pain meds PT Visit Diagnosis: Difficulty in walking, not elsewhere classified (R26.2);Pain Pain - Right/Left: Right Pain - part of body: Knee    Time: 2423-5361 PT Time Calculation (min) (ACUTE ONLY): 28 min   Charges:   PT Evaluation $PT Eval Low Complexity: 1 Low            [employer], PT 01/06/2018, 11:59 AM

## 2018-01-06 NOTE — Plan of Care (Signed)
  Problem: Clinical Measurements: Goal: Ability to maintain clinical measurements within normal limits will improve Outcome: Progressing   Problem: Clinical Measurements: Goal: Respiratory complications will improve Outcome: Progressing   Problem: Clinical Measurements: Goal: Cardiovascular complication will be avoided Outcome: Progressing   

## 2018-01-06 NOTE — Progress Notes (Signed)
Patient very shaky this morning, will notify RN and MD. Pt needs CIWA with corresponding lorazepam.

## 2018-01-06 NOTE — Progress Notes (Signed)
Patient refusing bed alarm. Requesting for bed alarm to be off.

## 2018-01-06 NOTE — Progress Notes (Signed)
Paged ORTHO about R knee immobilizer.  Lawsen Arnott, RN

## 2018-01-06 NOTE — Progress Notes (Signed)
Troponin resulted- 0.03, NP paged.

## 2018-01-06 NOTE — Progress Notes (Signed)
OT Cancellation Note  Patient Details Name: Brian [name] MRN: [id number] DOB: [birth year]   Cancelled Treatment:    Reason Eval/Treat Not Completed: Other (comment). Other medical staff presently in speaking with pt, will return for eval as schedule allows.  Evette Georges [phone] 01/06/2018, 9:33 AM

## 2018-01-06 NOTE — Evaluation (Addendum)
Occupational Therapy Evaluation and Discharge Patient Details Name: Brian [name] MRN: [id number] DOB: 1956-02-26 Today's Date: 01/06/2018    History of Present Illness Brian [name] is a 62 y.o. male admitted with COPD with acute exacerbation Pt with PHMx: of ETOH abuse, tobacco abuse, substance abuse, osteoarthritis, CHF, ischemic cardiomyopathy, chronic combined systolic and diastolic CHF, history of cocaine and crack abuse, COPD, homelessness, noncompliance with treatment, depression, history of suicide attempt who was discharged 2 days ago from [facility] due to COPD exacerbation, alcohol intoxication, pulmonary contusion and laceration to the neck and left arm after falling in unspecified height down a manhole.    Clinical Impression   This 62 yo male admitted with above presents to acute OT at a Mod I level with basic ADLs due to RLE pain. No further OT needs, we will sign off.  At rest on RA: HR 76, sats 93, RR 28 During ambulation on RA: HR 90, sats not lower than 90, RR at end 27 Pt's sats did drop to 87% on RA when he first sat down after returning from ambulation but back up into the low 90's in less than 5 seconds.    Follow Up Recommendations  No OT follow up    Equipment Recommendations  None recommended by OT       Precautions / Restrictions Precautions Precautions: Fall Restrictions Weight Bearing Restrictions: No      Mobility Bed Mobility Overal bed mobility: Modified Independent             General bed mobility comments: increased time due to right knee to thigh pain  Transfers Overall transfer level: Modified independent Equipment used: None                  Balance Overall balance assessment: (balance deficits due to pain in right LE (thigh to knee))                                         ADL either performed or assessed with clinical judgement   ADL Overall ADL's : Modified independent                                             Vision Patient Visual Report: No change from baseline              Pertinent Vitals/Pain Pain Assessment: 0-10 Pain Score: 9  Pain Location: rt knee to thigh Pain Descriptors / Indicators: Jabbing Pain Intervention(s): Limited activity within patient's tolerance;Monitored during session;Repositioned;Ice applied     Hand Dominance Right   Extremity/Trunk Assessment Upper Extremity Assessment Upper Extremity Assessment: Overall WFL for tasks assessed           Communication Communication Communication: No difficulties   Cognition Arousal/Alertness: Awake/alert Behavior During Therapy: WFL for tasks assessed/performed Overall Cognitive Status: Within Functional Limits for tasks assessed                                                Home Living Family/patient expects to be discharged to:: Shelter/Homeless  Prior Functioning/Environment Level of Independence: Independent                 OT Problem List: Impaired balance (sitting and/or standing);Pain      OT Treatment/Interventions:      OT Goals(Current goals can be found in the care plan section) Acute Rehab OT Goals Patient Stated Goal: get leg pain better  OT Frequency:             Co-evaluation PT/OT/SLP Co-Evaluation/Treatment: Yes Reason for Co-Treatment: To address functional/ADL transfers   OT goals addressed during session: ADL's and self-care;Strengthening/ROM      AM-PAC PT "6 Clicks" Daily Activity     Outcome Measure Help from another person eating meals?: None Help from another person taking care of personal grooming?: None Help from another person toileting, which includes using toliet, bedpan, or urinal?: None Help from another person bathing (including washing, rinsing, drying)?: None Help from another person to put on and taking off regular upper body clothing?: None Help  from another person to put on and taking off regular lower body clothing?: None 6 Click Score: 24   End of Session Equipment Utilized During Treatment: (pt declined use of gait belt) Nurse Communication: (O2 sats and RR rate)  Activity Tolerance: Patient tolerated treatment well Patient left: (sitting EOB)  OT Visit Diagnosis: Other abnormalities of gait and mobility (R26.89);Pain Pain - Right/Left: Right Pain - part of body: Leg                Time: 1030-1059 OT Time Calculation (min): 29 min Charges:  OT General Charges $OT Visit: 1 Visit OT Evaluation $OT Eval Moderate Complexity: 1 Mod  Ignacia Palma, OTR/L Acute [employer] [phone] Office [phone]

## 2018-01-06 NOTE — Progress Notes (Signed)
Patient ambulated with PT/OT. RR and oxygen saturation is as follows per OT notes.   "At rest on RA: HR 76, sats 93, RR 28 During ambulation on RA: HR 90, sats not lower than 90, RR at end 27 Pt's sats did drop to 87% on RA when he first sat down after returning from ambulation but back up into the low 90's in less than 5 seconds"

## 2018-01-06 NOTE — Progress Notes (Signed)
[facility] PROGRESS NOTE  MC FECKO LFY:[id number] DOB: 29-Mar-1956 DOA: 01/05/2018 PCP: [name], PA-C  Assessment/Plan: COPD with acute exacerbation [facility]). Much improved today. Chest xray with mild left basilar subsegmental atelectasis or infiltrate. CT of chest on 9/23 with minimal bibasilar subsegmental atelectasis. Oxygen saturation level greater than 90% on room air with ambulation. Does dip briefly after ambulation. He is afebrile and non-toxic appearing.  wean supplemental oxygen. Continue schedule and as needed bronchodilators.   Continue Solu-Medrol at lower dose Resume doxycycline.  Active Problems:   Tobacco dependence Tobacco cessation counseling provided.    Atypical chest pain. Chest xray as noted above. Troponin negative x3. No ekg changes. No further chest pain.    Chronic combined systolic and diastolic congestive heart failure (HCC) No signs of decompensation at this time. Home meds include coreg, lasix, lisinopril. Continue homemed    Hypokalemia repleted and resolved    Alcohol abuse Per patient, he says drinking 1 or 2 beers a day. He was recently at [facility] and was not consuming alcohol while admitted. CIWA protocol  Painful right knee. Likely as result of recent fall into manhole. Xray on 9/25 with no evidence of fracture, dislocation or joint effusion. Evaluated by PT who opine modified independence with ADLs and recommend brace Knee immobilizer Cane If no improvement consider OP MRI Pain control  Code Status: full Family Communication: none present Disposition Plan: homeless shelter   Consultants:  none  Procedures:  none  Antibiotics:  doxy  HPI/Subjective: Brian [name] is a 62 y.o. male with medical history significant of alcohol abuse, tobacco abuse, substance abuse, osteoarthritis, CHF, ischemic cardiomyopathy, chronic combined systolic and diastolic CHF, history of cocaine and crack abuse, COPD,  homelessness, noncompliance with treatment, depression, history of suicide attempt who was discharged 2 days ago from [facility] due to COPD exacerbation, alcohol intoxication, pulmonary contusion and laceration to the neck and left arm after falling presented to ED on 9/29 with cc sob. Associated symptoms include intermittent productive cough with green thick sputum. No other symptoms. Does complain of persistent right knee pain since fall 3 days ago.  Objective: Vitals:   01/06/18 1608 01/06/18 1614  BP: (!) 157/95   Pulse: 81   Resp: 18   Temp: 98.6 F (37 C)   SpO2: 91% 98%    Intake/Output Summary (Last 24 hours) at 01/06/2018 1616 Last data filed at 01/06/2018 1614 Gross per 24 hour  Intake 1502.69 ml  Output 450 ml  Net 1052.69 ml   Filed Weights   01/05/18 1737 01/05/18 2223  Weight: 88.6 kg 88 kg    Exam:   General:  Well nourished, malodorous, unkept in no acute distress  Cardiovascular: rrr no mgr no LE edema  Respiratory: normal effort BS quite distant but clear no wheeze or crackle  Abdomen: obese soft +BS no guarding or rebounding  Musculoskeletal: right knee some laxity. No erythema or swelling tender to touch. Decreased rom   Data Reviewed: Basic Metabolic Panel: Recent Labs  Lab 12/31/17 1249 01/02/18 0520 01/05/18 1740 01/05/18 2109 01/06/18 0623  NA 138 141 142  --  143  K 4.0 4.5 3.3*  --  4.9  CL 103 103 102  --  105  CO2 29 33* 29  --  28  GLUCOSE 86 114* 133*  --  171*  BUN 13 11 18   --  25*  CREATININE 0.70 0.49* 1.08  --  0.86  CALCIUM 8.7* 8.5* 9.9  --  9.6  MG  --   --   --  1.8  --   PHOS  --   --   --  4.5  --    Liver Function Tests: Recent Labs  Lab 01/06/18 0623  AST 24  ALT 28  ALKPHOS 47  BILITOT 0.4  PROT 6.6  ALBUMIN 3.2*   No results for input(s): LIPASE, AMYLASE in the last 168 hours. No results for input(s): AMMONIA in the last 168 hours. CBC: Recent Labs  Lab 12/31/17 1249 01/05/18 1740 01/06/18 0623  WBC 8.2  8.8 6.0  HGB 16.4 15.4 15.3  HCT 48.9 49.5 49.2  MCV 98.7 101.2* 101.2*  PLT 204 255 238   Cardiac Enzymes: Recent Labs  Lab 01/01/18 0357 01/01/18 0925 01/01/18 1511 01/06/18 0016 01/06/18 0623  TROPONINI 0.03* 0.03* 0.03* 0.03* <0.03   BNP (last 3 results) Recent Labs    11/03/17 1302 12/15/17 1442 01/05/18 1806  BNP 761.8* 252.3* 457.9*    ProBNP (last 3 results) No results for input(s): PROBNP in the last 8760 hours.  CBG: No results for input(s): GLUCAP in the last 168 hours.  No results found for this or any previous visit (from the past 240 hour(s)).   Studies: Dg Chest 2 View  Result Date: 01/05/2018 CLINICAL DATA:  Shortness of breath.  Chest pain. EXAM: CHEST - 2 VIEW COMPARISON:  Radiographs of December 31, 2017. FINDINGS: The heart size and mediastinal contours are within normal limits. No pneumothorax or pleural effusion is noted. Right lung is clear. Mild left basilar subsegmental atelectasis or infiltrate is noted. The visualized skeletal structures are unremarkable. IMPRESSION: Mild left basilar subsegmental atelectasis or infiltrate. Electronically Signed   By: Lupita Raider, M.D.   On: 01/05/2018 19:03    Scheduled Meds: . carvedilol  6.25 mg Oral BID WC  . dextromethorphan-guaiFENesin  1 tablet Oral BID  . doxycycline  100 mg Oral BID  . folic acid  1 mg Oral Daily  . furosemide  20 mg Oral Daily  . heparin  5,000 Units Subcutaneous Q8H  . ipratropium-albuterol  3 mL Nebulization Q6H  . lisinopril  5 mg Oral Daily  . multivitamin with minerals  1 tablet Oral Daily  . nicotine  21 mg Transdermal Daily  . thiamine  100 mg Oral Daily   Or  . thiamine  100 mg Intravenous Daily   Continuous Infusions: . sodium chloride Stopped (01/06/18 5409)    Principal Problem:   COPD with acute exacerbation (HCC) Active Problems:   Atypical chest pain   Hypokalemia   Chronic combined systolic and diastolic congestive heart failure (HCC)   Alcohol  abuse   Prolonged Q-T interval on ECG   Pain in right knee   Tobacco dependence    Time spent: 35 minutes    [facility] M  [facility]  If 7PM-7AM, please contact night-coverage at [email or web address], password [facility] 01/06/2018, 4:16 PM  LOS: 0 days

## 2018-01-06 NOTE — Plan of Care (Signed)
  Problem: Education: Goal: Knowledge of General Education information will improve Description: Including pain rating scale, medication(s)/side effects and non-pharmacologic comfort measures Outcome: Progressing   Problem: Health Behavior/Discharge Planning: Goal: Ability to manage health-related needs will improve Outcome: Progressing   Problem: Clinical Measurements: Goal: Respiratory complications will improve Outcome: Progressing   

## 2018-01-06 NOTE — Progress Notes (Signed)
Orthopedic Tech Progress Note Patient Details:  Brian [name] April 13, 1955 [id number]  Ortho Devices Type of Ortho Device: Knee Immobilizer Ortho Device/Splint Location: rle Ortho Device/Splint Interventions: Application   Post Interventions Patient Tolerated: Well Instructions Provided: Care of device   Nikki Dom 01/06/2018, 12:18 PM

## 2018-01-07 DIAGNOSIS — M25561 Pain in right knee: Secondary | ICD-10-CM

## 2018-01-07 DIAGNOSIS — F1010 Alcohol abuse, uncomplicated: Secondary | ICD-10-CM

## 2018-01-07 DIAGNOSIS — J441 Chronic obstructive pulmonary disease with (acute) exacerbation: Secondary | ICD-10-CM

## 2018-01-07 MED ORDER — ALBUTEROL SULFATE (2.5 MG/3ML) 0.083% IN NEBU: 2.5000 mg | INHALATION_SOLUTION | RESPIRATORY_TRACT | Status: DC | PRN

## 2018-01-07 MED ORDER — FOLIC ACID 1 MG PO TABS: 1.0000 mg | ORAL_TABLET | Freq: Every day | ORAL | 0 refills | Status: AC

## 2018-01-07 MED ORDER — DOXYCYCLINE HYCLATE 100 MG PO TABS: 100.0000 mg | ORAL_TABLET | Freq: Two times a day (BID) | ORAL | 0 refills | Status: DC

## 2018-01-07 MED ORDER — LISINOPRIL 5 MG PO TABS: 5.0000 mg | ORAL_TABLET | Freq: Every day | ORAL | 0 refills | Status: DC

## 2018-01-07 MED ORDER — CARVEDILOL 6.25 MG PO TABS: 6.2500 mg | ORAL_TABLET | Freq: Two times a day (BID) | ORAL | 0 refills | Status: AC

## 2018-01-07 MED ORDER — ALBUTEROL SULFATE HFA 108 (90 BASE) MCG/ACT IN AERS: 2.0000 | INHALATION_SPRAY | Freq: Four times a day (QID) | RESPIRATORY_TRACT | 2 refills | Status: DC | PRN

## 2018-01-07 MED ORDER — IPRATROPIUM-ALBUTEROL 0.5-2.5 (3) MG/3ML IN SOLN: 3.0000 mL | Freq: Two times a day (BID) | RESPIRATORY_TRACT | Status: DC

## 2018-01-07 MED ORDER — MOMETASONE FURO-FORMOTEROL FUM 200-5 MCG/ACT IN AERO: 2.0000 | INHALATION_SPRAY | Freq: Two times a day (BID) | RESPIRATORY_TRACT | 2 refills | Status: DC

## 2018-01-07 NOTE — Clinical Social Work Note (Signed)
Clinical Social Work Assessment  Patient Details  Name: Brian [name] MRN: [id number] Date of Birth: 1955/10/07  Date of referral:  01/07/18               Reason for consult:  Housing Concerns/Homelessness                Permission sought to share information with:    Permission granted to share information::  No  Name::        Agency::     Relationship::     Contact Information:     Housing/Transportation Living arrangements for the past 2 months:  Homeless Source of Information:  Patient, Medical Team Patient Interpreter Needed:  None Criminal Activity/Legal Involvement Pertinent to Current Situation/Hospitalization:  No - Comment as needed Significant Relationships:  Siblings, Other Family Members Lives with:  Self Do you feel safe going back to the place where you live?  Yes Need for family participation in patient care:  Yes (Comment)  Care giving concerns:  Homelessness.   Social Worker assessment / plan:  CSW met with patient. No supports at bedside. CSW introduced role and inquired about needs. Patient agreeable to receiving resources. CSW provided shelter list, emergency assistance list, booklets for food pantries and free meals, and bus passes. No further concerns. CSW signing off as social work intervention is no longer needed.  Employment status:  Unemployed Forensic scientist:  Self Pay (Medicaid Pending) PT Recommendations:  No Follow Up Information / Referral to community resources:  Shelter, Other (Comment Required)(Emergency assistance, food booklets, bus passes.)  Patient/Family's Response to care:  Patient agreeable to receiving resources. Patient's family supportive and involved in patient's care. Patient appreciated social work intervention.  Patient/Family's Understanding of and Emotional Response to Diagnosis, Current Treatment, and Prognosis:  Patient has a good understanding of the reason for admission and social work consult. Patient appears happy  with hospital care.  Emotional Assessment Appearance:  Appears stated age Attitude/Demeanor/Rapport:  Engaged, Gracious Affect (typically observed):  Accepting, Appropriate, Calm, Pleasant Orientation:  Oriented to Self, Oriented to Place, Oriented to  Time, Oriented to Situation Alcohol / Substance use:  Tobacco Use, Alcohol Use, Illicit Drugs Psych involvement (Current and /or in the community):  No (Comment)  Discharge Needs  Concerns to be addressed:  Care Coordination Readmission within the last 30 days:  Yes Current discharge risk:  Other(Homeless.) Barriers to Discharge:  No Barriers Identified   Candie Chroman, LCSW 01/07/2018, 2:41 PM

## 2018-01-07 NOTE — Care Management Note (Incomplete)
Case Management Note  Patient Details  Name: DIMITRIOUS TUNIS MRN: [id number] Date of Birth: [birth year]  Subjective/Objective:                    Action/Plan:   Expected Discharge Date:  10/01/17               Expected Discharge Plan:  Homeless Shelter  In-House Referral:  Clinical Social Work  Discharge planning Services  CM Consult, Indigent Health Clinic, [facility], Follow-up appt scheduled, Medication Assistance  Post Acute Care Choice:    Choice offered to:     DME Arranged:    DME Agency:     HH Arranged:    HH Agency:     Status of Service:  Completed, signed off  If discussed at [employer], dates discussed:    Additional Comments:  Darrold Span, RN 01/07/2018, 11:19 AM

## 2018-01-07 NOTE — Care Management Note (Addendum)
Case Management Note Donn Pierini RN, BSN Unit 4E- RN Care Coordinator -- 3E cross coverage [phone]  Patient Details  Name: Brian [name] MRN: [id number] Date of Birth: 1955/09/09  Subjective/Objective:  Pt presented with COPD                  Action/Plan: PTA Pt homeless, CSW consulted, (bus passes)- CM consulted for medication needs- however pt has been assisted with [facility] (July 2019) and is not eligible at this time for [facility] assistance-(one time use in a 12 mo. Rolling period). Pt was also assisted through [facility] on a previous admission. Pt has upcoming appointment this week at [facility] for 10/4 at 10:10. - Pt can use [facility] where meds would cost between $4-$10. CM will see about assisting with abx prior to discharge with the [facility] pharmacy- have spoken with Alinda Money at the pharmacy. CM also has spoken with pt at bedside who states he has a niece and maybe some friends that might can assist him with his medications. Gilmer Mor has been delivered to room per [facility].  1400-Per Alinda Money with [facility]- pt did not want to wait any longer for medications to be filled here and told him that he would take care of filling scripts.   Expected Discharge Date:  01/07/18               Expected Discharge Plan:  Home/Self Care  In-House Referral:  Clinical Social Work  Discharge planning Services  CM Consult, Indigent Health Clinic, Medication Assistance  Post Acute Care Choice:  Durable Medical Equipment Choice offered to:  Patient  DME Arranged:  Gilmer Mor DME Agency:  [facility]  HH Arranged:  NA HH Agency:  NA  Status of Service:  Completed, signed off  If discussed at Long Length of Stay Meetings, dates discussed:    Additional Comments:  Darrold Span, RN 01/07/2018, 12:44 PM

## 2018-01-07 NOTE — Progress Notes (Signed)
Physical Therapy Treatment Patient Details Name: Brian [name] MRN: [id number] DOB: 26-Jul-1955 Today's Date: 01/07/2018    History of Present Illness Brian [name] is a 62 y.o. male admitted with COPD with acute exacerbation Pt with PHMx: of ETOH abuse, tobacco abuse, substance abuse, osteoarthritis, CHF, ischemic cardiomyopathy, chronic combined systolic and diastolic CHF, history of cocaine and crack abuse, COPD, homelessness, noncompliance with treatment, depression, history of suicide attempt who was discharged 2 days ago from [facility] due to COPD exacerbation, alcohol intoxication, pulmonary contusion and laceration to the neck and left arm after falling in unspecified height down a manhole.     PT Comments    Patient given straight cane and provided education on sequencing cane in L hand with R LE to help with pain with during R LE WBing during ambulation. Pt with good carryover and sequencing and reports R knee to feel much better. Pt refused to amb in hallway because "Ive been up since 2am, I"m exhausted." Educated pt on importance of moving R knee to prevent stiffness from the edema, patient agreed. Acute PT to cont to follow.    Follow Up Recommendations  No PT follow up     Equipment Recommendations  Cane    Recommendations for Other Services       Precautions / Restrictions Precautions Precautions: Fall Restrictions Weight Bearing Restrictions: No    Mobility  Bed Mobility Overal bed mobility: Modified Independent                Transfers Overall transfer level: Modified independent Equipment used: None             General transfer comment: pushed up predominately on L LE  Ambulation/Gait Ambulation/Gait assistance: Modified independent (Device/Increase time) Gait Distance (Feet): 40 Feet Assistive device: Straight cane Gait Pattern/deviations: Step-through pattern;Decreased stride length;Antalgic Gait velocity: slow Gait velocity  interpretation: 1.31 - 2.62 ft/sec, indicative of limited community ambulator General Gait Details: pt would only amb in room, initially max directional v/c's to sequence cane with R LE in L hand to assist with pain management.    Stairs             Wheelchair Mobility    Modified Rankin (Stroke Patients Only)       Balance Overall balance assessment: Modified Independent                                          Cognition Arousal/Alertness: Awake/alert Behavior During Therapy: WFL for tasks assessed/performed Overall Cognitive Status: Within Functional Limits for tasks assessed                                        Exercises General Exercises - Lower Extremity Ankle Circles/Pumps: AROM;Both;10 reps;Seated Quad Sets: AROM;Right;10 reps;Supine Long Arc Quad: AROM;Right;10 reps;Seated    General Comments        Pertinent Vitals/Pain Pain Assessment: 0-10 Pain Score: 8  Pain Location: rt knee Pain Descriptors / Indicators: Jabbing Pain Intervention(s): Limited activity within patient's tolerance    Home Living                      Prior Function            PT Goals (current goals can now be found in the care  plan section) Acute Rehab PT Goals Patient Stated Goal: go home Progress towards PT goals: Progressing toward goals    Frequency    Min 3X/week      PT Plan Current plan remains appropriate    Co-evaluation              AM-PAC PT "6 Clicks" Daily Activity  Outcome Measure  Difficulty turning over in bed (including adjusting bedclothes, sheets and blankets)?: None Difficulty moving from lying on back to sitting on the side of the bed? : None Difficulty sitting down on and standing up from a chair with arms (e.g., wheelchair, bedside commode, etc,.)?: None Help needed moving to and from a bed to chair (including a wheelchair)?: None Help needed walking in hospital room?: A Little Help needed  climbing 3-5 steps with a railing? : A Little 6 Click Score: 22    End of Session   Activity Tolerance: Patient limited by pain Patient left: in bed;with call bell/phone within reach Nurse Communication: Mobility status PT Visit Diagnosis: Difficulty in walking, not elsewhere classified (R26.2);Pain Pain - Right/Left: Right Pain - part of body: Knee     Time: 8119-1478 PT Time Calculation (min) (ACUTE ONLY): 25 min  Charges:  $Gait Training: 8-22 mins $Therapeutic Exercise: 8-22 mins                     Lewis Shock, PT, DPT Acute Rehabilitation Services Pager #: [phone] Office #: [phone]    Brian [name] 01/07/2018, 10:01 AM

## 2018-01-07 NOTE — Progress Notes (Signed)
Patient said he was not waiting for his prescription to be filled by pharmacy. He lefty and said he will take care of it on his own.

## 2018-01-07 NOTE — Discharge Summary (Signed)
Physician Discharge Summary  Brian [name] LHT:[id number] DOB: 05/22/1955 DOA: 01/05/2018  PCP: [name], PA-C  Admit date: 01/05/2018 Discharge date: 01/07/2018  Admitted From: pt homeless. Will occasionally stay with niece  Discharge disposition: medically stable for d/c   Recommendations for Outpatient Follow-Up:   1. F/u PCP in 1-2 weeks-- referral to ortho for knee laxity and possible ligament injury Alcohol/tobacco and drug cessation   Discharge Diagnosis:   Principal Problem:   COPD with acute exacerbation (HCC) Active Problems:   Tobacco dependence   Atypical chest pain   Hypokalemia   Chronic combined systolic and diastolic congestive heart failure (HCC)   Alcohol abuse   Prolonged Q-T interval on ECG   Pain in right knee    Discharge Condition: Improved.  Diet recommendation: Low sodium, heart healthy.  Wound care: None.  Code status: Full.   History of Present Illness:   62yo male with pmh of etoh abuse, tobacco abuse, substance abuse with UDS + cocaine and THC this admit, homelessness, ICM with combined systolic and diastolic CHF, COPD, depression with hx of SA, noncompliance presented to ED on 9/28 with c/o SOB. Of note, pt discharged from [facility] 2 days prior to this admission after fall while intoxicated resulting in contusions and some lacerations. He was tx'd for COPD exacerbation at that time. He has not had rx'd medications in "a while" as he cannot afford them. Prior to that he was admitted to our service in early September for COPD exacerbation and left AMA.    Hospital Course by Problem:   Acute COPD exacerbation -continues to improve with O2 sats >90% on room air. Denies dyspnea. No wheeze -has remained afebrile, normal WBC count and nontoxic appearing -Pt was rx'd doxy at recent d/c from [facility], but left prior to medication being filled by pharmacy. Question of mild left basilar infiltrate on xray here. Doxy D2/5  Atypical  chest pain -denies recurrent pain. Trop neg x 2, ekg without any ischemic changes. ?r/t recent fall and contusions  Tobacco abuse -Pt counseled given recurrent COPD exacerbations.   Etoh abuse -no evidence of withdrawal this hospitalization -pt counseled  Cocaine/THC abuse -pt counseled  Hypokalemia -resolved with po repletion  Chronic systolic/diastolic HF -no evidence of decompensation this admit -noncompliant with home Lasix -Most recent echo 09/27/17 EF 30-35% with diffuse hypokinesis -continue daily lasix, coreg   Right knee strain  -occurred after recent fall -xray 9/25 unremarkable -Knee immobilizer and cane per PT recs -consider outpt MRI if no improvement -f/u PCP  Hypertension -mildly elevated while in hospital. Pt noncompliant with home meds therefore difficult to control. Lifestyle also not helping -continue ACEI  -f/u PCP  Noncompliance -with discuss filling medications with CM. Uncertain of when pt used service last   Medical Consultants:   none  Discharge Exam:   Vitals:   01/07/18 1030 01/07/18 1333  BP: (!) 149/97 (!) 154/90  Pulse: 74 86  Resp:  18  Temp:  98.1 F (36.7 C)  SpO2:  92%   Vitals:   01/07/18 0338 01/07/18 0826 01/07/18 1030 01/07/18 1333  BP: (!) 149/98  (!) 149/97 (!) 154/90  Pulse: 70  74 86  Resp: 19   18  Temp: 98 F (36.7 C)   98.1 F (36.7 C)  TempSrc: Oral   Oral  SpO2: 93% 92%  92%  Weight: 90.3 kg     Height:        General exam: Appears calm  and comfortable. Sitting in chair in NAD Respiratory system: Clear to auscultation. Respiratory effort normal. No w/r/c Cardiovascular system: S1 & S2 heard, RRR. No JVD,  rubs, gallops or clicks. No murmurs. Gastrointestinal system: Abdomen is nondistended, soft and nontender. No organomegaly or masses felt. Normal bowel sounds heard. Neuro: Alert and oriented. No focal neurological deficits. Extremities: No clubbing,  or cyanosis. No edema. Skin: No rashes, lesions  or ulcers. Psychiatry: Judgement and insight appear normal. Mood & affect appropriate.    The results of significant diagnostics from this hospitalization (including imaging, microbiology, ancillary and laboratory) are listed below for reference.     Procedures and Diagnostic Studies:   Dg Chest 2 View  Result Date: 01/05/2018 CLINICAL DATA:  Shortness of breath.  Chest pain. EXAM: CHEST - 2 VIEW COMPARISON:  Radiographs of December 31, 2017. FINDINGS: The heart size and mediastinal contours are within normal limits. No pneumothorax or pleural effusion is noted. Right lung is clear. Mild left basilar subsegmental atelectasis or infiltrate is noted. The visualized skeletal structures are unremarkable. IMPRESSION: Mild left basilar subsegmental atelectasis or infiltrate. Electronically Signed   By: Lupita Raider, M.D.   On: 01/05/2018 19:03     Labs:   Basic Metabolic Panel: Recent Labs  Lab 01/02/18 0520 01/05/18 1740 01/05/18 2109 01/06/18 0623  NA 141 142  --  143  K 4.5 3.3*  --  4.9  CL 103 102  --  105  CO2 33* 29  --  28  GLUCOSE 114* 133*  --  171*  BUN 11 18  --  25*  CREATININE 0.49* 1.08  --  0.86  CALCIUM 8.5* 9.9  --  9.6  MG  --   --  1.8  --   PHOS  --   --  4.5  --    GFR Estimated Creatinine Clearance: 97.3 mL/min (by C-G formula based on SCr of 0.86 mg/dL). Liver Function Tests: Recent Labs  Lab 01/06/18 0623  AST 24  ALT 28  ALKPHOS 47  BILITOT 0.4  PROT 6.6  ALBUMIN 3.2*   No results for input(s): LIPASE, AMYLASE in the last 168 hours. No results for input(s): AMMONIA in the last 168 hours. Coagulation profile No results for input(s): INR, PROTIME in the last 168 hours.  CBC: Recent Labs  Lab 01/05/18 1740 01/06/18 0623  WBC 8.8 6.0  HGB 15.4 15.3  HCT 49.5 49.2  MCV 101.2* 101.2*  PLT 255 238   Cardiac Enzymes: Recent Labs  Lab 01/01/18 0357 01/01/18 0925 01/01/18 1511 01/06/18 0016 01/06/18 0623  TROPONINI 0.03* 0.03*  0.03* 0.03* <0.03   BNP: Invalid input(s): POCBNP CBG: No results for input(s): GLUCAP in the last 168 hours. D-Dimer No results for input(s): DDIMER in the last 72 hours. Hgb A1c No results for input(s): HGBA1C in the last 72 hours. Lipid Profile No results for input(s): CHOL, HDL, LDLCALC, TRIG, CHOLHDL, LDLDIRECT in the last 72 hours. Thyroid function studies No results for input(s): TSH, T4TOTAL, T3FREE, THYROIDAB in the last 72 hours.  Invalid input(s): FREET3 Anemia work up No results for input(s): VITAMINB12, FOLATE, FERRITIN, TIBC, IRON, RETICCTPCT in the last 72 hours. Microbiology No results found for this or any previous visit (from the past 240 hour(s)).   Discharge Instructions:   Discharge Instructions    Diet - low sodium heart healthy   Complete by:  As directed    Discharge instructions   Complete by:  As directed    Referral  to ortho for knee Wear brace Stop smoking Stop alcohol   Increase activity slowly   Complete by:  As directed      Allergies as of 01/07/2018   No Known Allergies     Medication List    STOP taking these medications   furosemide 20 MG tablet Commonly known as:  LASIX   nitroGLYCERIN 0.4 MG SL tablet Commonly known as:  NITROSTAT   oxyCODONE-acetaminophen 5-325 MG tablet Commonly known as:  PERCOCET/ROXICET   traZODone 50 MG tablet Commonly known as:  DESYREL     TAKE these medications   albuterol 108 (90 Base) MCG/ACT inhaler Commonly known as:  PROVENTIL HFA;VENTOLIN HFA Inhale 2 puffs into the lungs every 6 (six) hours as needed for wheezing or shortness of breath.   carvedilol 6.25 MG tablet Commonly known as:  COREG Take 1 tablet (6.25 mg total) by mouth 2 (two) times daily with a meal.   doxycycline 100 MG tablet Commonly known as:  VIBRA-TABS Take 1 tablet (100 mg total) by mouth 2 (two) times daily.   folic acid 1 MG tablet Commonly known as:  FOLVITE Take 1 tablet (1 mg total) by mouth daily.     lisinopril 5 MG tablet Commonly known as:  PRINIVIL,ZESTRIL Take 1 tablet (5 mg total) by mouth daily.   mometasone-formoterol 200-5 MCG/ACT Aero Commonly known as:  DULERA Inhale 2 puffs into the lungs 2 (two) times daily.   nicotine 21 mg/24hr patch Commonly known as:  NICODERM CQ - dosed in mg/24 hours Place 1 patch (21 mg total) onto the skin daily.            Durable Medical Equipment  (From admission, onward)         Start     Ordered   01/07/18 1053  For home use only DME Cane  Once     01/07/18 1053   01/06/18 1148  For home use only DME Cane  Once     01/06/18 1147         Follow-up Information    Runell Gess, MD. Go on 01/17/2018.   Specialties:  Cardiology, Radiology Why:  @ 9:15 am Contact information: [address] [place] [address] [phone]            Time coordinating discharge:  40 min  Marlin Canary [facility]

## 2018-01-07 NOTE — Progress Notes (Signed)
Discharged instructions given. IV and tele removed. Awaiting for CM needs. Pt taking public transportation.

## 2018-01-10 ENCOUNTER — Emergency Department (HOSPITAL_COMMUNITY)
Admission: EM | Admit: 2018-01-10 | Discharge: 2018-01-10 | Discharge status: Against Medical Advice | Payer: Medicaid Other | Attending: Emergency Medicine | Admitting: Emergency Medicine

## 2018-01-10 ENCOUNTER — Encounter (HOSPITAL_COMMUNITY): Payer: Self-pay | Admitting: Emergency Medicine

## 2018-01-10 DIAGNOSIS — J449 Chronic obstructive pulmonary disease, unspecified: Secondary | ICD-10-CM | POA: Diagnosis not present

## 2018-01-10 DIAGNOSIS — Z590 Homelessness: Secondary | ICD-10-CM | POA: Insufficient documentation

## 2018-01-10 DIAGNOSIS — R0789 Other chest pain: Secondary | ICD-10-CM | POA: Insufficient documentation

## 2018-01-10 DIAGNOSIS — R0602 Shortness of breath: Secondary | ICD-10-CM | POA: Insufficient documentation

## 2018-01-10 DIAGNOSIS — Z5321 Procedure and treatment not carried out due to patient leaving prior to being seen by health care provider: Secondary | ICD-10-CM | POA: Diagnosis not present

## 2018-01-10 LAB — CBC
HCT: 50.6 % (ref 39.0–52.0)
HEMOGLOBIN: 15.7 g/dL (ref 13.0–17.0)
MCH: 31.3 pg (ref 26.0–34.0)
MCHC: 31 g/dL (ref 30.0–36.0)
MCV: 100.8 fL — ABNORMAL HIGH (ref 78.0–100.0)
PLATELETS: 292 10*3/uL (ref 150–400)
RBC: 5.02 MIL/uL (ref 4.22–5.81)
RDW: 13.4 % (ref 11.5–15.5)
WBC: 8.6 10*3/uL (ref 4.0–10.5)

## 2018-01-10 LAB — BASIC METABOLIC PANEL
ANION GAP: 13 (ref 5–15)
BUN: 19 mg/dL (ref 6–20)
CALCIUM: 8.8 mg/dL — AB (ref 8.9–10.3)
CO2: 25 mmol/L (ref 22–32)
CREATININE: 0.95 mg/dL (ref 0.61–1.24)
Chloride: 102 mmol/L (ref 98–111)
GFR calc non Af Amer: >60 mL/min (ref >60)
Glucose, Bld: 163 mg/dL — ABNORMAL HIGH (ref 70–99)
Potassium: 3.8 mmol/L (ref 3.5–5.1)
SODIUM: 140 mmol/L (ref 135–145)

## 2018-01-10 LAB — BRAIN NATRIURETIC PEPTIDE: B NATRIURETIC PEPTIDE 5: 112.7 pg/mL — AB (ref 0.0–100.0)

## 2018-01-10 LAB — I-STAT TROPONIN, ED: TROPONIN I, POC: 0.01 ng/mL (ref 0.00–0.08)

## 2018-01-10 MED ORDER — IPRATROPIUM-ALBUTEROL 0.5-2.5 (3) MG/3ML IN SOLN: 3.0000 mL | Freq: Once | RESPIRATORY_TRACT | Status: DC

## 2018-01-10 MED ORDER — ALBUTEROL SULFATE (2.5 MG/3ML) 0.083% IN NEBU
5.0000 mg | INHALATION_SOLUTION | Freq: Once | RESPIRATORY_TRACT | Status: AC
  Administered 2018-01-10: 5 mg via RESPIRATORY_TRACT
  Filled 2018-01-10: qty 6

## 2018-01-10 NOTE — ED Notes (Signed)
Patient being verbally abusive toward staff, cursing at and using racial slurs toward tech and RN as we attempted to get his EKG. Informed patient of triage process and that he would be waiting for a room. He has been seen by quick look NP.

## 2018-01-10 NOTE — ED Notes (Signed)
Pt seen walking to the bus stop. Security escorting pt out of lobby

## 2018-01-10 NOTE — ED Notes (Addendum)
Pt refused EKG.

## 2018-01-10 NOTE — ED Provider Notes (Signed)
Patient placed in Quick Look pathway, seen and evaluated   Chief Complaint: shortness of breath, chest pain  HPI: Brian [name] is a 62 y.o. male with hx of COPD, Polysubstance abuse and every day smoker who presents to the ED with shortness of breath and chest pain. Patient arrived via EMS. Patient reports that this is a recurring problem and is getting worse. Patient is homeless and dos not have resources to get medications.   ROS: Resp: shortness of breath  CV: chest pain  Physical Exam:  BP 130/67 (BP Location: Right Arm)   Pulse (!) 106   Temp 98.4 F (36.9 C) (Oral)   Resp 20   SpO2 99%    Gen: No distress  Neuro: Awake and Alert  Skin: Warm and dry  Lungs: decreased breath sounds, wheezing   Initiation of care has begun. The patient has been counseled on the process, plan, and necessity for staying for the completion/evaluation, and the remainder of the medical screening examination    Janne Napoleon, NP 01/10/18 1524    Rolan Bucco, MD 01/10/18 1536

## 2018-01-10 NOTE — ED Triage Notes (Signed)
Per [facility]: Patient to ED c/o shortness of breath and chest tightness. This is a recurring problem for patient - he is homeless and doesn't have resources to medications and oxygen. 90% RA, placed on 4L O2 with some relief (94%). Wheezing auscultated bilaterally. Given duoneb PTA. Respirations currently e/u at rest. Also given 324 ASA. Hx COPD, CHF, MI. EMS VS: 104/66, HR 108 regular, RR 22. Skin warm/dry.

## 2018-01-11 ENCOUNTER — Inpatient Hospital Stay (INDEPENDENT_AMBULATORY_CARE_PROVIDER_SITE_OTHER): Payer: Self-pay | Admitting: Physician Assistant

## 2018-01-14 ENCOUNTER — Emergency Department (HOSPITAL_COMMUNITY)
Admission: EM | Admit: 2018-01-14 | Discharge: 2018-01-15 | Disposition: A | Discharge status: Home / Self Care | Payer: Medicaid Other | Attending: Emergency Medicine | Admitting: Emergency Medicine

## 2018-01-14 ENCOUNTER — Other Ambulatory Visit: Payer: Self-pay

## 2018-01-14 ENCOUNTER — Emergency Department (HOSPITAL_COMMUNITY): Payer: Medicaid Other

## 2018-01-14 DIAGNOSIS — I5042 Chronic combined systolic (congestive) and diastolic (congestive) heart failure: Secondary | ICD-10-CM | POA: Insufficient documentation

## 2018-01-14 DIAGNOSIS — F10220 Alcohol dependence with intoxication, uncomplicated: Secondary | ICD-10-CM | POA: Diagnosis not present

## 2018-01-14 DIAGNOSIS — F17210 Nicotine dependence, cigarettes, uncomplicated: Secondary | ICD-10-CM | POA: Insufficient documentation

## 2018-01-14 DIAGNOSIS — F10920 Alcohol use, unspecified with intoxication, uncomplicated: Secondary | ICD-10-CM

## 2018-01-14 DIAGNOSIS — R05 Cough: Secondary | ICD-10-CM | POA: Diagnosis present

## 2018-01-14 DIAGNOSIS — Z79899 Other long term (current) drug therapy: Secondary | ICD-10-CM | POA: Diagnosis not present

## 2018-01-14 DIAGNOSIS — J40 Bronchitis, not specified as acute or chronic: Secondary | ICD-10-CM

## 2018-01-14 DIAGNOSIS — J441 Chronic obstructive pulmonary disease with (acute) exacerbation: Secondary | ICD-10-CM | POA: Insufficient documentation

## 2018-01-14 LAB — CBC
HCT: 49.6 % (ref 39.0–52.0)
Hemoglobin: 15.8 g/dL (ref 13.0–17.0)
MCH: 31.7 pg (ref 26.0–34.0)
MCHC: 31.9 g/dL (ref 30.0–36.0)
MCV: 99.6 fL (ref 78.0–100.0)
PLATELETS: 244 10*3/uL (ref 150–400)
RBC: 4.98 MIL/uL (ref 4.22–5.81)
RDW: 13.2 % (ref 11.5–15.5)
WBC: 10.4 10*3/uL (ref 4.0–10.5)

## 2018-01-14 LAB — I-STAT TROPONIN, ED: TROPONIN I, POC: 0.01 ng/mL (ref 0.00–0.08)

## 2018-01-14 LAB — COMPREHENSIVE METABOLIC PANEL
ALT: 38 U/L (ref 0–44)
AST: 33 U/L (ref 15–41)
Albumin: 3.4 g/dL — ABNORMAL LOW (ref 3.5–5.0)
Alkaline Phosphatase: 52 U/L (ref 38–126)
Anion gap: 10 (ref 5–15)
BILIRUBIN TOTAL: 0.5 mg/dL (ref 0.3–1.2)
BUN: 10 mg/dL (ref 6–20)
CALCIUM: 8.9 mg/dL (ref 8.9–10.3)
CO2: 26 mmol/L (ref 22–32)
CREATININE: 0.85 mg/dL (ref 0.61–1.24)
Chloride: 105 mmol/L (ref 98–111)
Glucose, Bld: 95 mg/dL (ref 70–99)
Potassium: 4 mmol/L (ref 3.5–5.1)
Sodium: 141 mmol/L (ref 135–145)
TOTAL PROTEIN: 6 g/dL — AB (ref 6.5–8.1)

## 2018-01-14 LAB — LIPASE, BLOOD: Lipase: 27 U/L (ref 11–51)

## 2018-01-14 MED ORDER — ALBUTEROL SULFATE (2.5 MG/3ML) 0.083% IN NEBU
5.0000 mg | INHALATION_SOLUTION | Freq: Once | RESPIRATORY_TRACT | Status: AC
  Administered 2018-01-14: 5 mg via RESPIRATORY_TRACT
  Filled 2018-01-14: qty 6

## 2018-01-14 MED ORDER — ACETAMINOPHEN 325 MG PO TABS
650.0000 mg | ORAL_TABLET | Freq: Once | ORAL | Status: AC
  Administered 2018-01-14: 650 mg via ORAL
  Filled 2018-01-14: qty 2

## 2018-01-14 MED ORDER — IPRATROPIUM BROMIDE 0.02 % IN SOLN
0.5000 mg | Freq: Once | RESPIRATORY_TRACT | Status: AC
  Administered 2018-01-14: 0.5 mg via RESPIRATORY_TRACT
  Filled 2018-01-14: qty 2.5

## 2018-01-14 MED ORDER — PREDNISONE 20 MG PO TABS
60.0000 mg | ORAL_TABLET | Freq: Once | ORAL | Status: AC
  Administered 2018-01-14: 60 mg via ORAL
  Filled 2018-01-14: qty 3

## 2018-01-14 MED ORDER — AZITHROMYCIN 250 MG PO TABS
500.0000 mg | ORAL_TABLET | Freq: Once | ORAL | Status: AC
  Administered 2018-01-14: 500 mg via ORAL
  Filled 2018-01-14: qty 2

## 2018-01-14 NOTE — ED Provider Notes (Addendum)
[facility] EMERGENCY DEPARTMENT Provider Note   CSN: [id number] Arrival date & time: 01/14/18  2109     History   Chief Complaint Chief Complaint  Patient presents with  . Abdominal Pain    HPI Brian [name] is a 62 y.o. male.  Patient c/o productive cough, left side and chest pain with coughing, general malaise for the past few days. Symptoms gradual onset, persistent, moderate, worsening. Hx etoh abuse, states drank 2 40 ou beers today. Denies sore throat or runny nose. Subjective fever. No vomiting or diarrhea. Denies exertional chest pain. No diaphoresis. Denies leg pain or swelling.   The history is provided by the patient and the EMS personnel.  Abdominal Pain   Associated symptoms include fever. Pertinent negatives include vomiting and headaches.    Past Medical History:  Diagnosis Date  . Alcohol abuse    Heavy alcohol abuse and homelessness  . Arthritis   . Cardiomyopathy (HCC)   . CHF (congestive heart failure) (HCC)   . Cocaine abuse (HCC)    And crack   . COPD (chronic obstructive pulmonary disease) (HCC)   . Heart failure   . Homelessness   . Noncompliance   . Paroxysmal VT (HCC)    a. first seen 2013 in setting of normal LVEF 55-60%, EF now decreased.  . Suicide attempt (HCC)   . Tobacco abuse     Patient Active Problem List   Diagnosis Date Noted  . Prolonged Q-T interval on ECG 01/06/2018  . Pain in right knee 01/06/2018  . Atypical chest pain 01/05/2018  . Hypokalemia 01/05/2018  . Chronic combined systolic and diastolic congestive heart failure (HCC) 01/05/2018  . Alcohol abuse 01/05/2018  . Pulmonary contusion 01/01/2018  . COPD exacerbation (HCC) 12/15/2017  . Acute respiratory failure with hypoxia (HCC) 11/13/2017  . Goals of care, counseling/discussion   . Palliative care by specialist   . Acute on chronic systolic CHF (congestive heart failure), NYHA class 4 (HCC) 11/03/2017  . Hyperglycemia 11/03/2017  . Acute  respiratory failure with hypoxia and hypercapnia (HCC) 10/26/2017  . Acute on chronic respiratory failure with hypoxia (HCC) 09/27/2017  . Tobacco dependence 09/27/2017  . Chronic systolic CHF (congestive heart failure) (HCC) 09/25/2017  . COPD with acute exacerbation (HCC) 09/30/2015  . Substance induced mood disorder (HCC) 02/04/2013  . Polysubstance (excluding opioids) dependence, daily use (HCC) 02/03/2013  . Alcohol dependence (HCC) 02/03/2013  . Gout attack 11/12/2012  . Closed fracture of 5th metacarpal 11/12/2012  . Chest pain 08/27/2012  . Acute exacerbation of chronic obstructive pulmonary disease (COPD) (HCC)   . Homelessness   . Cocaine abuse [facility])     Past Surgical History:  Procedure Laterality Date  . INNER EAR SURGERY          Home Medications    Prior to Admission medications   Medication Sig Start Date End Date Taking? Authorizing Provider  albuterol (PROVENTIL HFA;VENTOLIN HFA) 108 (90 Base) MCG/ACT inhaler Inhale 2 puffs into the lungs every 6 (six) hours as needed for wheezing or shortness of breath. 01/07/18   Joseph Art, DO  carvedilol (COREG) 6.25 MG tablet Take 1 tablet (6.25 mg total) by mouth 2 (two) times daily with a meal. 01/07/18 01/02/19  Joseph Art, DO  doxycycline (VIBRA-TABS) 100 MG tablet Take 1 tablet (100 mg total) by mouth 2 (two) times daily. 01/07/18   Joseph Art, DO  folic acid (FOLVITE) 1 MG tablet Take 1 tablet (1 mg  total) by mouth daily. 01/07/18 07/06/18  Joseph Art, DO  lisinopril (PRINIVIL,ZESTRIL) 5 MG tablet Take 1 tablet (5 mg total) by mouth daily. 01/07/18   Joseph Art, DO  mometasone-formoterol (DULERA) 200-5 MCG/ACT AERO Inhale 2 puffs into the lungs 2 (two) times daily. 01/07/18   Joseph Art, DO  nicotine (NICODERM CQ - DOSED IN MG/24 HOURS) 21 mg/24hr patch Place 1 patch (21 mg total) onto the skin daily. Patient not taking: Reported on 01/05/2018 11/08/17   [name], PA-C    Family  History Family History  Problem Relation Age of Onset  . Coronary artery disease Unknown   . Diabetes Unknown   . Cancer Sister     Social History Social History   Tobacco Use  . Smoking status: Current Every Day Smoker    Packs/day: 2.00    Types: Cigarettes  . Smokeless tobacco: Never Used  Substance Use Topics  . Alcohol use: Yes    Comment: Heavy. 5-10 40oz daily  . Drug use: No    Types: Marijuana, Cocaine    Comment: Marijuana and cocaine, crack     Allergies   Patient has no known allergies.   Review of Systems Review of Systems  Constitutional: Positive for fever.  HENT: Negative for sore throat.   Eyes: Negative for redness.  Respiratory: Positive for cough.   Cardiovascular: Positive for chest pain.  Gastrointestinal: Positive for abdominal pain. Negative for vomiting.  Endocrine: Negative for polyuria.  Genitourinary: Negative for flank pain.  Musculoskeletal: Negative for back pain and neck pain.  Skin: Negative for rash.  Neurological: Negative for headaches.  Hematological: Does not bruise/bleed easily.  Psychiatric/Behavioral: Negative for confusion.     Physical Exam Updated Vital Signs Ht 1.676 m (5\' 6" )   Wt 90 kg   SpO2 98%   BMI 32.02 kg/m   Physical Exam  Constitutional: He appears well-developed and well-nourished.  HENT:  Mouth/Throat: Oropharynx is clear and moist.  Eyes: Pupils are equal, round, and reactive to light. Conjunctivae are normal. No scleral icterus.  Neck: Neck supple. No tracheal deviation present.  Cardiovascular: Normal rate, regular rhythm, normal heart sounds and intact distal pulses. Exam reveals no gallop and no friction rub.  No murmur heard. Pulmonary/Chest: Effort normal. No accessory muscle usage. No respiratory distress. He has wheezes. He exhibits tenderness.  Rhonchi left.   Abdominal: Soft. Bowel sounds are normal. He exhibits no distension and no mass. There is no tenderness. There is no rebound and  no guarding. No hernia.  Genitourinary:  Genitourinary Comments: No cva tenderness.   Musculoskeletal: He exhibits no edema or tenderness.  Neurological: He is alert.  Skin: Skin is warm and dry. No rash noted.  Psychiatric: He has a normal mood and affect.  Nursing note and vitals reviewed.    ED Treatments / Results  Labs (all labs ordered are listed, but only abnormal results are displayed) Results for orders placed or performed during the hospital encounter of 01/14/18  Lipase, blood  Result Value Ref Range   Lipase 27 11 - 51 U/L  Comprehensive metabolic panel  Result Value Ref Range   Sodium 141 135 - 145 mmol/L   Potassium 4.0 3.5 - 5.1 mmol/L   Chloride 105 98 - 111 mmol/L   CO2 26 22 - 32 mmol/L   Glucose, Bld 95 70 - 99 mg/dL   BUN 10 6 - 20 mg/dL   Creatinine, Ser 6.04 0.61 - 1.24 mg/dL  Calcium 8.9 8.9 - 10.3 mg/dL   Total Protein 6.0 (L) 6.5 - 8.1 g/dL   Albumin 3.4 (L) 3.5 - 5.0 g/dL   AST 33 15 - 41 U/L   ALT 38 0 - 44 U/L   Alkaline Phosphatase 52 38 - 126 U/L   Total Bilirubin 0.5 0.3 - 1.2 mg/dL   GFR calc non Af Amer >60 >60 mL/min   GFR calc Af Amer >60 >60 mL/min   Anion gap 10 5 - 15  CBC  Result Value Ref Range   WBC 10.4 4.0 - 10.5 K/uL   RBC 4.98 4.22 - 5.81 MIL/uL   Hemoglobin 15.8 13.0 - 17.0 g/dL   HCT 91.6 38.4 - 66.5 %   MCV 99.6 78.0 - 100.0 fL   MCH 31.7 26.0 - 34.0 pg   MCHC 31.9 30.0 - 36.0 g/dL   RDW 99.3 57.0 - 17.7 %   Platelets 244 150 - 400 K/uL  I-stat troponin, ED  Result Value Ref Range   Troponin i, poc 0.01 0.00 - 0.08 ng/mL   Comment 3           Dg Chest 2 View  Result Date: 01/14/2018 CLINICAL DATA:  Chest pain EXAM: CHEST - 2 VIEW COMPARISON:  01/05/2018 FINDINGS: Cardiac shadow is mildly enlarged. The lungs are well aerated bilaterally. The previously seen left basilar changes have improved in the interval. No new focal abnormality is seen. No sizable effusion is noted. Degenerative changes of the thoracic spine  are seen. IMPRESSION: Slight improved aeration in the left lung base when compared with the prior study. Electronically Signed   By: Alcide Clever M.D.   On: 01/14/2018 21:54   Dg Chest 2 View  Result Date: 01/05/2018 CLINICAL DATA:  Shortness of breath.  Chest pain. EXAM: CHEST - 2 VIEW COMPARISON:  Radiographs of December 31, 2017. FINDINGS: The heart size and mediastinal contours are within normal limits. No pneumothorax or pleural effusion is noted. Right lung is clear. Mild left basilar subsegmental atelectasis or infiltrate is noted. The visualized skeletal structures are unremarkable. IMPRESSION: Mild left basilar subsegmental atelectasis or infiltrate. Electronically Signed   By: Lupita Raider, M.D.   On: 01/05/2018 19:03   Dg Chest 2 View  Result Date: 12/31/2017 CLINICAL DATA:  Fall.  Chest pain.  Shortness of breath. EXAM: CHEST - 2 VIEW COMPARISON:  12/25/2017 and 11/26/2017 FINDINGS: Heart size is upper limits of normal but unchanged. The trachea is midline. Lung markings are coarse in the lower chest. No focal airspace disease or pulmonary edema. Bony thorax is intact. Degenerative changes in the thoracic spine. No large effusions. IMPRESSION: No active cardiopulmonary disease. Electronically Signed   By: Richarda Overlie M.D.   On: 12/31/2017 13:13   Dg Abd 1 View  Result Date: 12/17/2017 CLINICAL DATA:  62 year old male with generalized abdominal pain. Initial encounter. EXAM: ABDOMEN - 1 VIEW COMPARISON:  No comparison abdominal films. Comparison chest x-ray 12/15/2017. FINDINGS: Moderate stool throughout the colon. No evidence of small-bowel obstruction. The possibility of free intraperitoneal air cannot be assessed on a supine view. IMPRESSION: Moderate stool throughout the colon. Electronically Signed   By: Lacy Duverney M.D.   On: 12/17/2017 19:05   Ct Head Wo Contrast  Result Date: 12/31/2017 CLINICAL DATA:  Recent fall into [place], initial encounter EXAM: CT HEAD WITHOUT CONTRAST CT  CERVICAL SPINE WITHOUT CONTRAST TECHNIQUE: Multidetector CT imaging of the head and cervical spine was performed following the standard protocol  without intravenous contrast. Multiplanar CT image reconstructions of the cervical spine were also generated. COMPARISON:  04/09/2016 FINDINGS: CT HEAD FINDINGS Brain: Somewhat limited by patient motion artifact. No findings to suggest acute hemorrhage, acute infarction or space-occupying mass lesion are noted. Vascular: No hyperdense vessel or unexpected calcification. Skull: Normal. Negative for fracture or focal lesion. Healed fracture of the right zygomatic arch is again noted. Sinuses/Orbits: No acute finding. Other: None. CT CERVICAL SPINE FINDINGS Alignment: Within normal limits. Skull base and vertebrae: Imaging is significantly limited by patient motion artifact. No definitive acute fracture or acute facet abnormality is noted. Osteophytic changes and facet hypertrophic changes are seen. Soft tissues and spinal canal: Surrounding soft tissue structures demonstrate vascular calcifications. No hematoma or lymphadenopathy is identified. Upper chest: Within normal limits. Other: None IMPRESSION: CT of the head: Somewhat limited exam by patient motion artifact. No acute intracranial abnormality is noted. CT of the cervical spine: Somewhat limited by patient motion artifact. Degenerative changes of the cervical spine are noted without acute bony abnormality. Electronically Signed   By: Alcide Clever M.D.   On: 12/31/2017 14:41   Ct Chest W Contrast  Result Date: 12/31/2017 CLINICAL DATA:  Shortness of breath after fall. EXAM: CT CHEST, ABDOMEN, AND PELVIS WITH CONTRAST TECHNIQUE: Multidetector CT imaging of the chest, abdomen and pelvis was performed following the standard protocol during bolus administration of intravenous contrast. CONTRAST:  ISOVUE-300 IOPAMIDOL (ISOVUE-300) INJECTION 61% COMPARISON:  Radiographs of same day. CT scan of October 02, 2015 and  March 04, 2013. FINDINGS: CT CHEST FINDINGS Cardiovascular: Atherosclerosis of thoracic aorta is noted without aneurysm or dissection. Normal cardiac size. No pericardial effusion. Coronary artery calcifications are noted. Mediastinum/Nodes: No enlarged mediastinal, hilar, or axillary lymph nodes. Thyroid gland, trachea, and esophagus demonstrate no significant findings. Lungs/Pleura: No pneumothorax or pleural effusion is noted. Minimal bibasilar subsegmental atelectasis is noted. Musculoskeletal: No chest wall mass or suspicious bone lesions identified. CT ABDOMEN PELVIS FINDINGS Hepatobiliary: No focal liver abnormality is seen. No gallstones, gallbladder wall thickening, or biliary dilatation. Pancreas: Unremarkable. No pancreatic ductal dilatation or surrounding inflammatory changes. Spleen: Normal in size without focal abnormality. Adrenals/Urinary Tract: Adrenal glands are unremarkable. Kidneys are normal, without renal calculi, focal lesion, or hydronephrosis. Bladder is unremarkable. Stomach/Bowel: Stomach is within normal limits. Appendix appears normal. Diverticulosis of descending and sigmoid colon is noted. 52 x 9 mm crescent-shaped fluid collection is seen lateral to the descending colon which may represent inflammatory fluid collection from previous inflammation. There is no evidence of bowel obstruction. Vascular/Lymphatic: Aortic atherosclerosis. No enlarged abdominal or pelvic lymph nodes. Reproductive: Stable prostatic calcifications. Other: Small fat containing left inguinal hernia is noted. No ascites is noted. Musculoskeletal: Bilateral pars defects are noted at L5. No acute osseous abnormality is noted. IMPRESSION: No definite evidence of acute traumatic injury involving the chest, abdomen or pelvis. Coronary artery calcifications are noted suggesting coronary artery disease. Diverticulosis of descending and sigmoid colon is noted. 5.2 x 0.9 cm crescent-shaped fluid collection is seen  lateral to the descending colon which may represent inflammatory fluid collection from acute or chronic inflammation. Clinical correlation is recommended. Small fat containing left inguinal hernia. Aortic Atherosclerosis (ICD10-I70.0). Electronically Signed   By: Lupita Raider, M.D.   On: 12/31/2017 14:53   Ct Cervical Spine Wo Contrast  Result Date: 12/31/2017 CLINICAL DATA:  Recent fall into [place], initial encounter EXAM: CT HEAD WITHOUT CONTRAST CT CERVICAL SPINE WITHOUT CONTRAST TECHNIQUE: Multidetector CT imaging of the head and cervical spine  was performed following the standard protocol without intravenous contrast. Multiplanar CT image reconstructions of the cervical spine were also generated. COMPARISON:  04/09/2016 FINDINGS: CT HEAD FINDINGS Brain: Somewhat limited by patient motion artifact. No findings to suggest acute hemorrhage, acute infarction or space-occupying mass lesion are noted. Vascular: No hyperdense vessel or unexpected calcification. Skull: Normal. Negative for fracture or focal lesion. Healed fracture of the right zygomatic arch is again noted. Sinuses/Orbits: No acute finding. Other: None. CT CERVICAL SPINE FINDINGS Alignment: Within normal limits. Skull base and vertebrae: Imaging is significantly limited by patient motion artifact. No definitive acute fracture or acute facet abnormality is noted. Osteophytic changes and facet hypertrophic changes are seen. Soft tissues and spinal canal: Surrounding soft tissue structures demonstrate vascular calcifications. No hematoma or lymphadenopathy is identified. Upper chest: Within normal limits. Other: None IMPRESSION: CT of the head: Somewhat limited exam by patient motion artifact. No acute intracranial abnormality is noted. CT of the cervical spine: Somewhat limited by patient motion artifact. Degenerative changes of the cervical spine are noted without acute bony abnormality. Electronically Signed   By: Alcide Clever M.D.   On:  12/31/2017 14:41   Ct Abdomen Pelvis W Contrast  Result Date: 12/31/2017 CLINICAL DATA:  Shortness of breath after fall. EXAM: CT CHEST, ABDOMEN, AND PELVIS WITH CONTRAST TECHNIQUE: Multidetector CT imaging of the chest, abdomen and pelvis was performed following the standard protocol during bolus administration of intravenous contrast. CONTRAST:  ISOVUE-300 IOPAMIDOL (ISOVUE-300) INJECTION 61% COMPARISON:  Radiographs of same day. CT scan of October 02, 2015 and March 04, 2013. FINDINGS: CT CHEST FINDINGS Cardiovascular: Atherosclerosis of thoracic aorta is noted without aneurysm or dissection. Normal cardiac size. No pericardial effusion. Coronary artery calcifications are noted. Mediastinum/Nodes: No enlarged mediastinal, hilar, or axillary lymph nodes. Thyroid gland, trachea, and esophagus demonstrate no significant findings. Lungs/Pleura: No pneumothorax or pleural effusion is noted. Minimal bibasilar subsegmental atelectasis is noted. Musculoskeletal: No chest wall mass or suspicious bone lesions identified. CT ABDOMEN PELVIS FINDINGS Hepatobiliary: No focal liver abnormality is seen. No gallstones, gallbladder wall thickening, or biliary dilatation. Pancreas: Unremarkable. No pancreatic ductal dilatation or surrounding inflammatory changes. Spleen: Normal in size without focal abnormality. Adrenals/Urinary Tract: Adrenal glands are unremarkable. Kidneys are normal, without renal calculi, focal lesion, or hydronephrosis. Bladder is unremarkable. Stomach/Bowel: Stomach is within normal limits. Appendix appears normal. Diverticulosis of descending and sigmoid colon is noted. 52 x 9 mm crescent-shaped fluid collection is seen lateral to the descending colon which may represent inflammatory fluid collection from previous inflammation. There is no evidence of bowel obstruction. Vascular/Lymphatic: Aortic atherosclerosis. No enlarged abdominal or pelvic lymph nodes. Reproductive: Stable prostatic  calcifications. Other: Small fat containing left inguinal hernia is noted. No ascites is noted. Musculoskeletal: Bilateral pars defects are noted at L5. No acute osseous abnormality is noted. IMPRESSION: No definite evidence of acute traumatic injury involving the chest, abdomen or pelvis. Coronary artery calcifications are noted suggesting coronary artery disease. Diverticulosis of descending and sigmoid colon is noted. 5.2 x 0.9 cm crescent-shaped fluid collection is seen lateral to the descending colon which may represent inflammatory fluid collection from acute or chronic inflammation. Clinical correlation is recommended. Small fat containing left inguinal hernia. Aortic Atherosclerosis (ICD10-I70.0). Electronically Signed   By: Lupita Raider, M.D.   On: 12/31/2017 14:53   Dg Knee Complete 4 Views Right  Result Date: 01/02/2018 CLINICAL DATA:  Right knee pain after fall. EXAM: RIGHT KNEE - COMPLETE 4+ VIEW COMPARISON:  Radiographs of Aug 16, 2014. FINDINGS: No evidence of fracture, dislocation, or joint effusion. No evidence of arthropathy or other focal bone abnormality. Soft tissues are unremarkable. IMPRESSION: Normal right knee. Electronically Signed   By: Lupita Raider, M.D.   On: 01/02/2018 09:47    EKG EKG Interpretation  Date/Time:  Monday January 14 2018 21:17:49 EDT Ventricular Rate:  113 PR Interval:    QRS Duration: 85 QT Interval:  329 QTC Calculation: 452 R Axis:   84 Text Interpretation:  Sinus tachycardia Borderline repolarization abnormality No significant change since last tracing Confirmed by Cathren Laine ([id number]) on 01/14/2018 9:20:28 PM   Radiology Dg Chest 2 View  Result Date: 01/14/2018 CLINICAL DATA:  Chest pain EXAM: CHEST - 2 VIEW COMPARISON:  01/05/2018 FINDINGS: Cardiac shadow is mildly enlarged. The lungs are well aerated bilaterally. The previously seen left basilar changes have improved in the interval. No new focal abnormality is seen. No sizable effusion  is noted. Degenerative changes of the thoracic spine are seen. IMPRESSION: Slight improved aeration in the left lung base when compared with the prior study. Electronically Signed   By: Alcide Clever M.D.   On: 01/14/2018 21:54    Procedures Procedures (including critical care time)  Medications Ordered in ED Medications - No data to display   Initial Impression / Assessment and Plan / ED Course  I have reviewed the triage vital signs and the nursing notes.  Pertinent labs & imaging results that were available during my care of the patient were reviewed by me and considered in my medical decision making (see chart for details).  Iv ns. Continuous pulse ox and monitor. Ecg. Cxr. Labs.  Reviewed nursing notes and prior charts for additional history.   Albuterol and atrovent neb. Acetaminophen po.   Productive cough, rhonchi left base - ?possible early pna. zithromax po.    Wheezing improved but persists. Additional neb. pred po.   Given etoh intoxication, will plan to observe in ED, and tentatively plan for d/c in AM when clinically more sober.   Signed out to Dr Wilkie Aye.     Final Clinical Impressions(s) / ED Diagnoses   Final diagnoses:  None    ED Discharge Orders    None           Cathren Laine, MD 01/14/18 2321

## 2018-01-14 NOTE — ED Notes (Signed)
Patient transported to X-ray 

## 2018-01-14 NOTE — ED Triage Notes (Addendum)
Patient c/o abd pain followed by CP that has been ongoing all day. Patient is homeless and told EMS he has been out walking today. Denies N/V and diaphoresis; but endorses mild SOB (hx of COPD). Smells of ETOH. Patient given 324 asa and 1 SL nitro.

## 2018-01-14 NOTE — Discharge Instructions (Signed)
It was our pleasure to provide your ER care today - we hope that you feel better.  Take antibiotic as prescribed. Use albuterol as need. Avoid any smoking. Also avoid alcohol use -  follow up with AA, and use resource guide provided for additional community resources.  Follow up with primary care doctor in the coming week.  Return to ER if worse, increased trouble breathing, other concern.

## 2018-01-15 LAB — URINALYSIS, ROUTINE W REFLEX MICROSCOPIC
Bilirubin Urine: NEGATIVE
GLUCOSE, UA: NEGATIVE mg/dL
HGB URINE DIPSTICK: NEGATIVE
Ketones, ur: NEGATIVE mg/dL
Leukocytes, UA: NEGATIVE
Nitrite: NEGATIVE
PROTEIN: 100 mg/dL — AB
Specific Gravity, Urine: 1.016 (ref 1.005–1.030)
pH: 5 (ref 5.0–8.0)

## 2018-01-15 MED ORDER — IPRATROPIUM-ALBUTEROL 0.5-2.5 (3) MG/3ML IN SOLN
3.0000 mL | Freq: Once | RESPIRATORY_TRACT | Status: AC
  Administered 2018-01-15: 3 mL via RESPIRATORY_TRACT
  Filled 2018-01-15: qty 6

## 2018-01-15 MED ORDER — ALBUTEROL SULFATE HFA 108 (90 BASE) MCG/ACT IN AERS: 2.0000 | INHALATION_SPRAY | Freq: Once | RESPIRATORY_TRACT | Status: DC

## 2018-01-15 MED ORDER — AZITHROMYCIN 250 MG PO TABS: 250.0000 mg | ORAL_TABLET | Freq: Every day | ORAL | 0 refills | Status: DC

## 2018-01-15 MED ORDER — PREDNISONE 20 MG PO TABS: 40.0000 mg | ORAL_TABLET | Freq: Every day | ORAL | 0 refills | Status: DC

## 2018-01-15 NOTE — ED Notes (Signed)
Acuity change was in error.

## 2018-01-15 NOTE — ED Notes (Signed)
Patient verbalizes understanding of discharge instructions. Opportunity for questioning and answers were provided. Armband removed by staff, pt discharged from ED home via BUS.  

## 2018-01-15 NOTE — ED Provider Notes (Signed)
Per nursing report, patient has been resting and ambulating independently.  O2 sats 94% on 3 L at the bedside.  Ambulation he drops to 89%.  He continues to have some wheeze.  Will give an additional DuoNeb.  5:35 AM On my evaluation, patient appears at his baseline.  He continues to have some wheezing but is in no respiratory distress and is ambulatory independently.  Will treat for COPD exacerbation with steroids and azithromycin.  Patient was discharged with an inhaler.   Shon Baton, MD 01/15/18 [phone]

## 2018-01-15 NOTE — ED Notes (Addendum)
Pt given a sandwich and something to drink 

## 2018-01-17 ENCOUNTER — Ambulatory Visit: Payer: Self-pay | Admitting: Cardiology

## 2018-01-18 ENCOUNTER — Encounter: Payer: Self-pay | Admitting: *Deleted

## 2018-02-11 ENCOUNTER — Other Ambulatory Visit: Payer: Self-pay

## 2018-02-11 ENCOUNTER — Emergency Department (HOSPITAL_COMMUNITY)
Admission: EM | Admit: 2018-02-11 | Discharge: 2018-02-12 | Disposition: A | Discharge status: Home / Self Care | Payer: Medicaid Other | Attending: Emergency Medicine | Admitting: Emergency Medicine

## 2018-02-11 ENCOUNTER — Emergency Department (HOSPITAL_COMMUNITY): Payer: Medicaid Other

## 2018-02-11 ENCOUNTER — Encounter (HOSPITAL_COMMUNITY): Payer: Self-pay | Admitting: Emergency Medicine

## 2018-02-11 DIAGNOSIS — R0602 Shortness of breath: Secondary | ICD-10-CM | POA: Diagnosis not present

## 2018-02-11 DIAGNOSIS — R079 Chest pain, unspecified: Secondary | ICD-10-CM | POA: Insufficient documentation

## 2018-02-11 DIAGNOSIS — Z5321 Procedure and treatment not carried out due to patient leaving prior to being seen by health care provider: Secondary | ICD-10-CM | POA: Insufficient documentation

## 2018-02-11 LAB — CBC
HCT: 54 % — ABNORMAL HIGH (ref 39.0–52.0)
HEMOGLOBIN: 16.9 g/dL (ref 13.0–17.0)
MCH: 31.4 pg (ref 26.0–34.0)
MCHC: 31.3 g/dL (ref 30.0–36.0)
MCV: 100.2 fL — AB (ref 80.0–100.0)
NRBC: 0 % (ref 0.0–0.2)
Platelets: 213 10*3/uL (ref 150–400)
RBC: 5.39 MIL/uL (ref 4.22–5.81)
RDW: 13.2 % (ref 11.5–15.5)
WBC: 7.2 10*3/uL (ref 4.0–10.5)

## 2018-02-11 LAB — BASIC METABOLIC PANEL
Anion gap: 13 (ref 5–15)
BUN: 7 mg/dL (ref 6–20)
CHLORIDE: 104 mmol/L (ref 98–111)
CO2: 21 mmol/L — ABNORMAL LOW (ref 22–32)
CREATININE: 0.7 mg/dL (ref 0.61–1.24)
Calcium: 8.9 mg/dL (ref 8.9–10.3)
GFR calc non Af Amer: >60 mL/min (ref >60)
Glucose, Bld: 87 mg/dL (ref 70–99)
Potassium: 4.2 mmol/L (ref 3.5–5.1)
Sodium: 138 mmol/L (ref 135–145)

## 2018-02-11 LAB — I-STAT TROPONIN, ED: Troponin i, poc: 0.03 ng/mL (ref 0.00–0.08)

## 2018-02-11 NOTE — ED Triage Notes (Addendum)
Pt to ED via [facility] with c /o chest pain and shortness of breath x's 2 days.  Pt is homeless and st's he has been walking a lot today.  Pt also c/o non-productive cough Pt admits to 2 (40oz) beers today.   EMS gave pt ASA

## 2018-02-12 NOTE — ED Notes (Signed)
Pt called for room no answer

## 2018-02-20 ENCOUNTER — Ambulatory Visit: Payer: Medicaid Other | Admitting: Interventional Cardiology

## 2018-02-23 ENCOUNTER — Emergency Department (HOSPITAL_COMMUNITY): Payer: Medicaid Other

## 2018-02-23 ENCOUNTER — Inpatient Hospital Stay (HOSPITAL_COMMUNITY)
Admission: EM | Admit: 2018-02-23 | Discharge: 2018-02-26 | DRG: 286 | Disposition: A | Discharge status: Home / Self Care | Payer: Medicaid Other | Attending: Internal Medicine | Admitting: Internal Medicine

## 2018-02-23 ENCOUNTER — Encounter (HOSPITAL_COMMUNITY): Payer: Self-pay | Admitting: Emergency Medicine

## 2018-02-23 DIAGNOSIS — F1010 Alcohol abuse, uncomplicated: Secondary | ICD-10-CM | POA: Diagnosis present

## 2018-02-23 DIAGNOSIS — I200 Unstable angina: Secondary | ICD-10-CM | POA: Diagnosis not present

## 2018-02-23 DIAGNOSIS — Z5900 Homelessness unspecified: Secondary | ICD-10-CM

## 2018-02-23 DIAGNOSIS — F10220 Alcohol dependence with intoxication, uncomplicated: Secondary | ICD-10-CM

## 2018-02-23 DIAGNOSIS — R451 Restlessness and agitation: Secondary | ICD-10-CM | POA: Diagnosis not present

## 2018-02-23 DIAGNOSIS — Z7951 Long term (current) use of inhaled steroids: Secondary | ICD-10-CM

## 2018-02-23 DIAGNOSIS — R0902 Hypoxemia: Secondary | ICD-10-CM

## 2018-02-23 DIAGNOSIS — I10 Essential (primary) hypertension: Secondary | ICD-10-CM

## 2018-02-23 DIAGNOSIS — M4802 Spinal stenosis, cervical region: Secondary | ICD-10-CM | POA: Diagnosis present

## 2018-02-23 DIAGNOSIS — R079 Chest pain, unspecified: Secondary | ICD-10-CM | POA: Diagnosis present

## 2018-02-23 DIAGNOSIS — I420 Dilated cardiomyopathy: Secondary | ICD-10-CM | POA: Diagnosis present

## 2018-02-23 DIAGNOSIS — I272 Pulmonary hypertension, unspecified: Secondary | ICD-10-CM | POA: Diagnosis present

## 2018-02-23 DIAGNOSIS — I5042 Chronic combined systolic (congestive) and diastolic (congestive) heart failure: Secondary | ICD-10-CM | POA: Diagnosis not present

## 2018-02-23 DIAGNOSIS — F17210 Nicotine dependence, cigarettes, uncomplicated: Secondary | ICD-10-CM | POA: Diagnosis present

## 2018-02-23 DIAGNOSIS — J441 Chronic obstructive pulmonary disease with (acute) exacerbation: Secondary | ICD-10-CM | POA: Diagnosis present

## 2018-02-23 DIAGNOSIS — F1210 Cannabis abuse, uncomplicated: Secondary | ICD-10-CM | POA: Diagnosis present

## 2018-02-23 DIAGNOSIS — I472 Ventricular tachycardia: Secondary | ICD-10-CM | POA: Diagnosis not present

## 2018-02-23 DIAGNOSIS — J9621 Acute and chronic respiratory failure with hypoxia: Secondary | ICD-10-CM | POA: Diagnosis not present

## 2018-02-23 DIAGNOSIS — M542 Cervicalgia: Secondary | ICD-10-CM | POA: Diagnosis present

## 2018-02-23 DIAGNOSIS — I2510 Atherosclerotic heart disease of native coronary artery without angina pectoris: Secondary | ICD-10-CM | POA: Diagnosis present

## 2018-02-23 DIAGNOSIS — I5043 Acute on chronic combined systolic (congestive) and diastolic (congestive) heart failure: Secondary | ICD-10-CM | POA: Diagnosis present

## 2018-02-23 DIAGNOSIS — Z09 Encounter for follow-up examination after completed treatment for conditions other than malignant neoplasm: Secondary | ICD-10-CM

## 2018-02-23 DIAGNOSIS — M25512 Pain in left shoulder: Secondary | ICD-10-CM | POA: Diagnosis present

## 2018-02-23 DIAGNOSIS — Z9114 Patient's other noncompliance with medication regimen: Secondary | ICD-10-CM

## 2018-02-23 DIAGNOSIS — Z79899 Other long term (current) drug therapy: Secondary | ICD-10-CM

## 2018-02-23 DIAGNOSIS — F1410 Cocaine abuse, uncomplicated: Secondary | ICD-10-CM | POA: Diagnosis present

## 2018-02-23 DIAGNOSIS — F14188 Cocaine abuse with other cocaine-induced disorder: Secondary | ICD-10-CM | POA: Diagnosis present

## 2018-02-23 DIAGNOSIS — R072 Precordial pain: Secondary | ICD-10-CM

## 2018-02-23 DIAGNOSIS — F1020 Alcohol dependence, uncomplicated: Secondary | ICD-10-CM | POA: Diagnosis present

## 2018-02-23 DIAGNOSIS — I110 Hypertensive heart disease with heart failure: Principal | ICD-10-CM | POA: Diagnosis present

## 2018-02-23 DIAGNOSIS — R29898 Other symptoms and signs involving the musculoskeletal system: Secondary | ICD-10-CM

## 2018-02-23 DIAGNOSIS — F17200 Nicotine dependence, unspecified, uncomplicated: Secondary | ICD-10-CM | POA: Diagnosis present

## 2018-02-23 DIAGNOSIS — M79602 Pain in left arm: Secondary | ICD-10-CM

## 2018-02-23 DIAGNOSIS — F1920 Other psychoactive substance dependence, uncomplicated: Secondary | ICD-10-CM | POA: Diagnosis present

## 2018-02-23 LAB — CBC
HCT: 53.2 % — ABNORMAL HIGH (ref 39.0–52.0)
HEMOGLOBIN: 16.5 g/dL (ref 13.0–17.0)
MCH: 31.9 pg (ref 26.0–34.0)
MCHC: 31 g/dL (ref 30.0–36.0)
MCV: 102.9 fL — ABNORMAL HIGH (ref 80.0–100.0)
NRBC: 0 % (ref 0.0–0.2)
PLATELETS: 156 10*3/uL (ref 150–400)
RBC: 5.17 MIL/uL (ref 4.22–5.81)
RDW: 13.3 % (ref 11.5–15.5)
WBC: 6.3 10*3/uL (ref 4.0–10.5)

## 2018-02-23 LAB — BASIC METABOLIC PANEL
ANION GAP: 11 (ref 5–15)
BUN: 5 mg/dL — ABNORMAL LOW (ref 6–20)
CO2: 25 mmol/L (ref 22–32)
Calcium: 8.6 mg/dL — ABNORMAL LOW (ref 8.9–10.3)
Chloride: 102 mmol/L (ref 98–111)
Creatinine, Ser: 0.77 mg/dL (ref 0.61–1.24)
Glucose, Bld: 76 mg/dL (ref 70–99)
POTASSIUM: 4 mmol/L (ref 3.5–5.1)
Sodium: 138 mmol/L (ref 135–145)

## 2018-02-23 LAB — I-STAT TROPONIN, ED: TROPONIN I, POC: 0.04 ng/mL (ref 0.00–0.08)

## 2018-02-23 MED ORDER — METHYLPREDNISOLONE SODIUM SUCC 125 MG IJ SOLR
125.0000 mg | Freq: Once | INTRAMUSCULAR | Status: AC
  Administered 2018-02-24: 125 mg via INTRAVENOUS
  Filled 2018-02-23: qty 2

## 2018-02-23 MED ORDER — IPRATROPIUM-ALBUTEROL 0.5-2.5 (3) MG/3ML IN SOLN
3.0000 mL | Freq: Once | RESPIRATORY_TRACT | Status: AC
  Administered 2018-02-23: 3 mL via RESPIRATORY_TRACT
  Filled 2018-02-23: qty 3

## 2018-02-23 MED ORDER — KETOROLAC TROMETHAMINE 30 MG/ML IJ SOLN
30.0000 mg | Freq: Once | INTRAMUSCULAR | Status: AC
  Administered 2018-02-23: 30 mg via INTRAVENOUS
  Filled 2018-02-23: qty 1

## 2018-02-23 MED ORDER — IPRATROPIUM-ALBUTEROL 0.5-2.5 (3) MG/3ML IN SOLN: 3.0000 mL | RESPIRATORY_TRACT | Status: DC | PRN

## 2018-02-23 NOTE — H&P (Signed)
History and Physical    Brian [name]:[id number] DOB: January 28, 1956 DOA: 02/23/2018  Referring MD/NP/PA:   PCP: [name], PA-C   Patient coming from:  The patient is coming from home.  At baseline, pt is independent for most of ADL.        Chief Complaint: Chest pain, left shoulder pain, left neck pain, cough, wheezing  HPI: Brian [name] is a 62 y.o. male with medical history significant of homeless, hypertension, COPD, polysubstance abuse (tobacco, alcohol, cocaine marijuana), CHF with EF 30%, who presents with chest pain, left shoulder pain, left neck pain, cough, wheezing.  Patient states that he started having left chest pain today. The pain is constant, 10 out of 10 severity, sharp, radiating to left arm and shoulder.  Patient further states that he has left shoulder pain, left arm and left neck pain, which is constant, sharp, 10 out of 10 severity.  It is associated with tingling sensation in left fingers.  No weakness, facial droop or slurred speech.  He has cough with greenish colored sputum production, shortness of breath, wheezing.  No fever or chills.  No nausea, vomiting, diarrhea or abdominal pain no symptoms of UTI.  Patient states that he is not taking any medications at home.  He is homeless.  ED Course: pt was found to have negative troponin, WBC 6.3, electrolytes renal function okay, no fever, has tachycardia, tachypnea, oxygen satting 91% on room air, chest x-ray showed mild atelectasis.  Patient is placed on telemetry bed for observation.  MRI-C spin: 1. Motion degraded examination, decreasing detailed assessment of the spinal canal and neural foramina on the axial images. 2. C3-4 mild spinal canal and moderate bilateral neural foraminal stenosis. 3. Otherwise, mild midcervical degenerative disc disease without high-grade stenosis, degraded by motion.  Review of Systems:   General: no fevers, chills, no body weight gain, has fatigue HEENT: no blurry  vision, hearing changes or sore throat Respiratory: has dyspnea, coughing, wheezing CV: has chest pain, no palpitations GI: no nausea, vomiting, abdominal pain, diarrhea, constipation GU: no dysuria, burning on urination, increased urinary frequency, hematuria  Ext: no leg edema Neuro: no unilateral weakness, numbness, or tingling, no vision change or hearing loss Skin: no rash, no skin tear. MSK: has left neck, shoulder and arm pain. Heme: No easy bruising.  Travel history: No recent long distant travel.  Allergy: No Known Allergies  Past Medical History:  Diagnosis Date  . Alcohol abuse    Heavy alcohol abuse and homelessness  . Arthritis   . Cardiomyopathy (HCC)   . CHF (congestive heart failure) (HCC)   . Cocaine abuse (HCC)    And crack   . COPD (chronic obstructive pulmonary disease) (HCC)   . Heart failure   . Homelessness   . Noncompliance   . Paroxysmal VT (HCC)    a. first seen 2013 in setting of normal LVEF 55-60%, EF now decreased.  . Suicide attempt (HCC)   . Tobacco abuse     Past Surgical History:  Procedure Laterality Date  . INNER EAR SURGERY      Social History:  reports that he has been smoking cigarettes. He has been smoking about 2.00 packs per day. He has never used smokeless tobacco. He reports that he drinks alcohol. He reports that he does not use drugs.  Family History:  Family History  Problem Relation Age of Onset  . Coronary artery disease Unknown   . Diabetes Unknown   . Cancer Sister  Prior to Admission medications   Medication Sig Start Date End Date Taking? Authorizing Provider  albuterol (PROVENTIL HFA;VENTOLIN HFA) 108 (90 Base) MCG/ACT inhaler Inhale 2 puffs into the lungs every 6 (six) hours as needed for wheezing or shortness of breath. 01/07/18   Joseph Art, DO  azithromycin (ZITHROMAX) 250 MG tablet Take 1 tablet (250 mg total) by mouth daily. Take first 2 tablets together, then 1 every day until finished. Patient not  taking: Reported on 02/23/2018 01/15/18   Horton, Mayer Masker, MD  carvedilol (COREG) 6.25 MG tablet Take 1 tablet (6.25 mg total) by mouth 2 (two) times daily with a meal. 01/07/18 01/02/19  Joseph Art, DO  doxycycline (VIBRA-TABS) 100 MG tablet Take 1 tablet (100 mg total) by mouth 2 (two) times daily. 01/07/18   Joseph Art, DO  folic acid (FOLVITE) 1 MG tablet Take 1 tablet (1 mg total) by mouth daily. 01/07/18 07/06/18  Joseph Art, DO  lisinopril (PRINIVIL,ZESTRIL) 5 MG tablet Take 1 tablet (5 mg total) by mouth daily. 01/07/18   Joseph Art, DO  mometasone-formoterol (DULERA) 200-5 MCG/ACT AERO Inhale 2 puffs into the lungs 2 (two) times daily. 01/07/18   Joseph Art, DO  nicotine (NICODERM CQ - DOSED IN MG/24 HOURS) 21 mg/24hr patch Place 1 patch (21 mg total) onto the skin daily. Patient not taking: Reported on 01/05/2018 11/08/17   [name], PA-C  predniSONE (DELTASONE) 20 MG tablet Take 2 tablets (40 mg total) by mouth daily. 01/15/18   Shon Baton, MD    Physical Exam: Vitals:   02/23/18 2200 02/24/18 0045 02/24/18 0331 02/24/18 0400  BP: 136/89 135/77 (!) 147/97   Pulse: (!) 103 93 86   Resp: (!) 35 (!) 34 18   Temp:   (!) 97.4 F (36.3 C)   TempSrc:   Oral   SpO2: 91% 94% 91% 93%  Weight:   86.9 kg   Height:   5\' 7"  (1.702 m)    General: Not in acute distress HEENT:       Eyes: PERRL, EOMI, no scleral icterus.       ENT: No discharge from the ears and nose, no pharynx injection, no tonsillar enlargement.        Neck: No JVD, no bruit, no mass felt. Heme: No neck lymph node enlargement. Cardiac: S1/S2, RRR, No murmurs, No gallops or rubs. Respiratory: Wheezing bilaterally GI: Soft, nondistended, nontender, no rebound pain, no organomegaly, BS present. GU: No hematuria Ext: No pitting leg edema bilaterally. 2+DP/PT pulse bilaterally. Musculoskeletal: Has tenderness in left shoulder. Skin: No rashes.  Neuro: Alert, oriented X3, cranial nerves  II-XII grossly intact, moves all extremities normally Psych: Patient is not psychotic, no suicidal or hemocidal ideation.  Labs on Admission: I have personally reviewed following labs and imaging studies  CBC: Recent Labs  Lab 02/23/18 2043  WBC 6.3  HGB 16.5  HCT 53.2*  MCV 102.9*  PLT 156   Basic Metabolic Panel: Recent Labs  Lab 02/23/18 2043  NA 138  K 4.0  CL 102  CO2 25  GLUCOSE 76  BUN 5*  CREATININE 0.77  CALCIUM 8.6*   GFR: Estimated Creatinine Clearance: 103.3 mL/min (by C-G formula based on SCr of 0.77 mg/dL). Liver Function Tests: No results for input(s): AST, ALT, ALKPHOS, BILITOT, PROT, ALBUMIN in the last 168 hours. No results for input(s): LIPASE, AMYLASE in the last 168 hours. No results for input(s): AMMONIA in the last 168  hours. Coagulation Profile: No results for input(s): INR, PROTIME in the last 168 hours. Cardiac Enzymes: Recent Labs  Lab 02/24/18 0302  TROPONINI 0.04*   BNP (last 3 results) No results for input(s): PROBNP in the last 8760 hours. HbA1C: No results for input(s): HGBA1C in the last 72 hours. CBG: No results for input(s): GLUCAP in the last 168 hours. Lipid Profile: Recent Labs    02/24/18 0302  CHOL 127  HDL 62  LDLCALC 51  TRIG 69  CHOLHDL 2.0   Thyroid Function Tests: No results for input(s): TSH, T4TOTAL, FREET4, T3FREE, THYROIDAB in the last 72 hours. Anemia Panel: No results for input(s): VITAMINB12, FOLATE, FERRITIN, TIBC, IRON, RETICCTPCT in the last 72 hours. Urine analysis:    Component Value Date/Time   COLORURINE YELLOW 01/15/2018 0257   APPEARANCEUR CLEAR 01/15/2018 0257   APPEARANCEUR Clear 10/10/2012 0320   LABSPEC 1.016 01/15/2018 0257   LABSPEC 1.009 10/10/2012 0320   PHURINE 5.0 01/15/2018 0257   GLUCOSEU NEGATIVE 01/15/2018 0257   GLUCOSEU Negative 10/10/2012 0320   HGBUR NEGATIVE 01/15/2018 0257   BILIRUBINUR NEGATIVE 01/15/2018 0257   BILIRUBINUR Negative 10/10/2012 0320   KETONESUR  NEGATIVE 01/15/2018 0257   PROTEINUR 100 (A) 01/15/2018 0257   UROBILINOGEN 0.2 08/27/2012 0659   NITRITE NEGATIVE 01/15/2018 0257   LEUKOCYTESUR NEGATIVE 01/15/2018 0257   LEUKOCYTESUR Negative 10/10/2012 0320   Sepsis Labs: @LABRCNTIP (procalcitonin:4,lacticidven:4) )No results found for this or any previous visit (from the past 240 hour(s)).   Radiological Exams on Admission: Dg Chest 2 View  Result Date: 02/23/2018 CLINICAL DATA:  Acute onset of sharp left-sided chest pain, radiating to the left shoulder and left arm. Left hand numbness. Dizziness. EXAM: CHEST - 2 VIEW COMPARISON:  Chest radiograph performed 02/11/2018 FINDINGS: The lungs are well-aerated. Minimal bibasilar opacity likely reflects atelectasis. There is no evidence of pleural effusion or pneumothorax. The heart is mildly enlarged. No acute osseous abnormalities are seen. IMPRESSION: Minimal bibasilar opacity likely reflects atelectasis. Mild cardiomegaly. Electronically Signed   By: Roanna Raider M.D.   On: 02/23/2018 21:21   Mr Cervical Spine Wo Contrast  Result Date: 02/24/2018 CLINICAL DATA:  Neck pain and left arm pain. Left finger tingling for 2 weeks. EXAM: MRI CERVICAL SPINE WITHOUT CONTRAST TECHNIQUE: Multiplanar, multisequence MR imaging of the cervical spine was performed. No intravenous contrast was administered. COMPARISON:  None. FINDINGS: The examination is markedly motion degraded, particularly on the axial images, which limits assessment of the spinal canal and neural foramina. Alignment: Normal. Vertebrae: There are Modic type 1 degenerative endplate signal changes at C3-4. No acute compression fracture or discitis-osteomyelitis. Cord: Normal caliber and signal. Posterior Fossa, vertebral arteries, paraspinal tissues: Visualized posterior fossa is normal. Vertebral artery flow voids are preserved. No prevertebral effusion. Disc levels: Sagittal imaging includes the atlantoaxial joint to the level of the T3-4  disc space, with axial imaging of the disc spaces from C2-3 to C7-T1. C1-2: Normal. C2-3: Normal. C3-4: Small disc bulge with bilateral uncovertebral hypertrophy. Mild spinal canal stenosis. Moderate bilateral neural foraminal stenosis. Normal facets. C4-5: Small disc bulge with normal facets. No central spinal canal stenosis or neural foraminal stenosis. C5-6: Markedly motion degraded. No high-grade spinal canal stenosis. C6-7: Markedly motion degraded. No high-grade spinal canal stenosis. C7-T1: Normal. Sagittal imaging of the T1-T4 disc levels shows no spinal canal or neural foraminal stenosis. IMPRESSION: 1. Motion degraded examination, decreasing detailed assessment of the spinal canal and neural foramina on the axial images. 2. C3-4 mild spinal canal and  moderate bilateral neural foraminal stenosis. 3. Otherwise, mild midcervical degenerative disc disease without high-grade stenosis, degraded by motion. Electronically Signed   By: Deatra Robinson M.D.   On: 02/24/2018 01:02     EKG: Independently reviewed.  Sinus rhythm, QTC 481, no ischemic change.  Assessment/Plan Principal Problem:   Chest pain Active Problems:   Homelessness   Cocaine abuse (HCC)   Polysubstance (excluding opioids) dependence, daily use (HCC)   Alcohol dependence (HCC)   Acute on chronic respiratory failure with hypoxia (HCC)   Tobacco dependence   COPD exacerbation (HCC)   Acute on chronic combined systolic and diastolic CHF (congestive heart failure) (HCC)   Left shoulder pain   Neck pain on left side   Chest pain: Initial troponin negative, repeated troponin is 0.04, but his chest pain has resolved.  Possibly due to demand ischemia secondary to COPD exacerbation and a possible CHF exacerbation.  - will place on Tele bed for obs - cycle CE q6 x3 and repeat EKG in the am  - prn Nitroglycerin, Morphine, and aspirin, lipitor  - Risk factor stratification: will check FLP and A1C, UDS - did not order 2d echo-->  please reevaluate pt in AM to decide if pt needs 2D echo. - inpt card consult was requested via Epic  Acute on chronic combined systolic and diastolic CHF: 2D echo on 09/27/2017 showed EF 30-35% with grade 2 diastolic dysfunction.  Patient has shortness of breath, BNP is elevated 523, indicating possible CHF exacerbation. -Start Lasix 40 mg twice daily  COPD exacerbation: -Bronchodilators - Doxycycline - Solu-Medrol, 60 mg 3 times daily -PRN Mucinex -Sputum culture  Acute on chronic respiratory failure with hypoxia: Due to COPD and CHF exacerbation -See above  Homelessness: -consult to SW and Cm  Polysubstance (excluding opioids) dependence, daily use: Including cocaine, tobacco, alcohol, marijuana. -UDS - Nicotine patch -CIWA protocol  Pain in left shoulder, neck and arm and finger numbness: MRI of the C-spine foramen showed C3-4 mild spinal canal and moderate bilateral neural foraminal stenosis. -PRN Percocet, Tylenol for pain   DVT ppx: SQ Lovenox Code Status: Full code Family Communication: None at bed side. Disposition Plan:  Anticipate discharge back to previous home environment Consults called:  none Admission status: Obs / tele     Date of Service 02/24/2018    Lorretta Harp [facility] Pager [phone]  If 7PM-7AM, please contact night-coverage [email or web address] Password [email or web address] 02/24/2018, 6:02 AM

## 2018-02-23 NOTE — ED Triage Notes (Addendum)
Per EMS, [place], c/o sharp left sided chest pain that goes into his left shoulder and down his left arm.  Started at 11am, got progressively worse.  Pt only c/o dizziness, no sob.  EMS gave 324 ASA and 2 nitro, pain decreased from 10/10 to 8/10

## 2018-02-23 NOTE — ED Provider Notes (Signed)
Emergency Department Provider Note   I have reviewed the triage vital signs and the nursing notes.   HISTORY  Chief Complaint Chest Pain   HPI Brian [name] is a 62 y.o. male with PMH of alcohol abuse, COPD, tobacco use, CHF, and alcohol abuse presents to the emergency department with severe pain in the left arm and neck.  The patient states he had some associated left chest pain as well.  Symptoms began this morning.  He has tingling in the left fingers.  He notes this is been on and off for 1 to 2 weeks but worsening significantly today.  He denies shortness of breath. She pain is sharp and severe. No fever or chills. No productive cough. Patient given Nitro with EMS with minimal improvement in pain. Denies any leg weakness/numbness. Patient was given ASA with EMS.    Past Medical History:  Diagnosis Date  . Alcohol abuse    Heavy alcohol abuse and homelessness  . Arthritis   . Cardiomyopathy (HCC)   . CHF (congestive heart failure) (HCC)   . Cocaine abuse (HCC)    And crack   . COPD (chronic obstructive pulmonary disease) (HCC)   . Heart failure   . Homelessness   . Noncompliance   . Paroxysmal VT (HCC)    a. first seen 2013 in setting of normal LVEF 55-60%, EF now decreased.  . Suicide attempt (HCC)   . Tobacco abuse     Patient Active Problem List   Diagnosis Date Noted  . Left shoulder pain 02/23/2018  . Prolonged Q-T interval on ECG 01/06/2018  . Pain in right knee 01/06/2018  . Atypical chest pain 01/05/2018  . Hypokalemia 01/05/2018  . Chronic combined systolic and diastolic congestive heart failure (HCC) 01/05/2018  . Alcohol abuse 01/05/2018  . Pulmonary contusion 01/01/2018  . COPD exacerbation (HCC) 12/15/2017  . Acute respiratory failure with hypoxia (HCC) 11/13/2017  . Goals of care, counseling/discussion   . Palliative care by specialist   . Acute on chronic systolic CHF (congestive heart failure), NYHA class 4 (HCC) 11/03/2017  .  Hyperglycemia 11/03/2017  . Acute respiratory failure with hypoxia and hypercapnia (HCC) 10/26/2017  . Acute on chronic respiratory failure with hypoxia (HCC) 09/27/2017  . Tobacco dependence 09/27/2017  . Chronic systolic CHF (congestive heart failure) (HCC) 09/25/2017  . COPD with acute exacerbation (HCC) 09/30/2015  . Substance induced mood disorder (HCC) 02/04/2013  . Polysubstance (excluding opioids) dependence, daily use (HCC) 02/03/2013  . Alcohol dependence (HCC) 02/03/2013  . Gout attack 11/12/2012  . Closed fracture of 5th metacarpal 11/12/2012  . Chest pain 08/27/2012  . Acute exacerbation of chronic obstructive pulmonary disease (COPD) (HCC)   . Homelessness   . Cocaine abuse [facility])     Past Surgical History:  Procedure Laterality Date  . INNER EAR SURGERY     Allergies Patient has no known allergies.  Family History  Problem Relation Age of Onset  . Coronary artery disease Unknown   . Diabetes Unknown   . Cancer Sister     Social History Social History   Tobacco Use  . Smoking status: Current Every Day Smoker    Packs/day: 2.00    Types: Cigarettes  . Smokeless tobacco: Never Used  Substance Use Topics  . Alcohol use: Yes    Comment: Heavy. 5-10 40oz daily  . Drug use: No    Types: Marijuana, Cocaine    Comment: Marijuana and cocaine, crack    Review of Systems  Constitutional: No fever/chills Eyes: No visual changes. ENT: No sore throat. Cardiovascular: Positive chest pain. Respiratory: Denies shortness of breath. Gastrointestinal: No abdominal pain.  No nausea, no vomiting.  No diarrhea.  No constipation. Genitourinary: Negative for dysuria. Musculoskeletal: Negative for back pain. Positive left neck and arm pain.  Skin: Negative for rash. Neurological: Negative for headaches, focal weakness or numbness.  10-point ROS otherwise negative.  ____________________________________________   PHYSICAL EXAM:  VITAL SIGNS: ED Triage Vitals  Enc  Vitals Group     BP 02/23/18 2037 (!) 136/99     Pulse Rate 02/23/18 2037 (!) 102     Resp 02/23/18 2037 (!) 26     Temp 02/23/18 2037 98.1 F (36.7 C)     Temp Source 02/23/18 2037 Oral     SpO2 02/23/18 2037 93 %     Weight 02/23/18 2039 192 lb (87.1 kg)     Height 02/23/18 2039 5\' 6"  (1.676 m)     Pain Score 02/23/18 2038 8   Constitutional: Alert and oriented. Well appearing and in no acute distress. Eyes: Conjunctivae are normal. Head: Atraumatic. Nose: No congestion/rhinnorhea. Mouth/Throat: Mucous membranes are moist.  Oropharynx non-erythematous. Neck: No stridor.   Cardiovascular: Normal rate, regular rhythm. Good peripheral circulation. Grossly normal heart sounds.   Respiratory: Normal respiratory effort.  No retractions. Lungs with bilateral end-expiratory wheezing.  Gastrointestinal: Soft and nontender. No distention.  Musculoskeletal: No lower extremity tenderness nor edema. No gross deformities of extremities.  Neurologic:  Normal speech and language. Decreased grip strength on the left. Normal leg strength. Normal sensation in the B/L upper and lower extremities.  Skin:  Skin is warm, dry and intact. No rash noted.  ____________________________________________   LABS (all labs ordered are listed, but only abnormal results are displayed)  Labs Reviewed  BASIC METABOLIC PANEL - Abnormal; Notable for the following components:      Result Value   BUN 5 (*)    Calcium 8.6 (*)    All other components within normal limits  CBC - Abnormal; Notable for the following components:   HCT 53.2 (*)    MCV 102.9 (*)    All other components within normal limits  I-STAT TROPONIN, ED   ____________________________________________  EKG   EKG Interpretation  Date/Time:  Saturday February 23 2018 20:34:56 EST Ventricular Rate:  102 PR Interval:    QRS Duration: 80 QT Interval:  369 QTC Calculation: 481 R Axis:   64 Text Interpretation:  Sinus tachycardia Borderline  prolonged QT interval No STEMI.  Confirmed by Alona Bene [phone]) on 02/23/2018 8:42:52 PM       ____________________________________________  RADIOLOGY  Dg Chest 2 View  Result Date: 02/23/2018 CLINICAL DATA:  Acute onset of sharp left-sided chest pain, radiating to the left shoulder and left arm. Left hand numbness. Dizziness. EXAM: CHEST - 2 VIEW COMPARISON:  Chest radiograph performed 02/11/2018 FINDINGS: The lungs are well-aerated. Minimal bibasilar opacity likely reflects atelectasis. There is no evidence of pleural effusion or pneumothorax. The heart is mildly enlarged. No acute osseous abnormalities are seen. IMPRESSION: Minimal bibasilar opacity likely reflects atelectasis. Mild cardiomegaly. Electronically Signed   By: Roanna Raider M.D.   On: 02/23/2018 21:21    ____________________________________________   PROCEDURES  Procedure(s) performed:   Procedures  CRITICAL CARE Performed by: Maia Plan Total critical care time: 35 minutes Critical care time was exclusive of separately billable procedures and treating other patients. Critical care was necessary to treat or prevent imminent or  life-threatening deterioration. Critical care was time spent personally by me on the following activities: development of treatment plan with patient and/or surrogate as well as nursing, discussions with consultants, evaluation of patient's response to treatment, examination of patient, obtaining history from patient or surrogate, ordering and performing treatments and interventions, ordering and review of laboratory studies, ordering and review of radiographic studies, pulse oximetry and re-evaluation of patient's condition.  Alona Bene, MD Emergency Medicine  ____________________________________________   INITIAL IMPRESSION / ASSESSMENT AND PLAN / ED COURSE  Pertinent labs & imaging results that were available during my care of the patient were reviewed by me and considered in  my medical decision making (see chart for details).  Patient presents to the emergency department for evaluation of sharp, left neck/shoulder pain radiating into the arm.  He has some similar pain in the left chest.  Symptoms seem mostly radicular.  Does not seem consistent with acute coronary syndrome.  Low suspicion for PE.  Patient is having some wheezing so plan for DuoNeb.  Given his decreased strength in the left hand as well as tingling symptoms there I do plan for MRI of the cervical spine.  Chest x-ray reviewed with no acute findings.  11:15 PM  Patient with improved CP. Wheezing and air-entry improved with neb. Plan for steroids and additional ned. MRI c-spine pending. Patient does have hypoxemia and requiring 3L [place] O2 at this time. Plan for admit for enzyme trending, COPD mgmt, and to follow MRI.   Discussed patient's case with Hospitalist, Dr. Clyde Lundborg to request admission. Patient and family (if present) updated with plan. Care transferred to Hospitalist service. Admit team will follow up on MRI results.   I reviewed all nursing notes, vitals, pertinent old records, EKGs, labs, imaging (as available).  ____________________________________________  FINAL CLINICAL IMPRESSION(S) / ED DIAGNOSES  Final diagnoses:  Precordial chest pain  COPD exacerbation (HCC)  Left arm pain  Left hand weakness  Hypoxemia    MEDICATIONS GIVEN DURING THIS VISIT:  Medications  methylPREDNISolone sodium succinate (SOLU-MEDROL) 125 mg/2 mL injection 125 mg (has no administration in time range)  ipratropium-albuterol (DUONEB) 0.5-2.5 (3) MG/3ML nebulizer solution 3 mL (has no administration in time range)  ipratropium-albuterol (DUONEB) 0.5-2.5 (3) MG/3ML nebulizer solution 3 mL (3 mLs Nebulization Given 02/23/18 2138)  ketorolac (TORADOL) 30 MG/ML injection 30 mg (30 mg Intravenous Given 02/23/18 2137)    Note:  This document was prepared using Dragon voice recognition software and may include  unintentional dictation errors.  Alona Bene, MD Emergency Medicine    , Arlyss Repress, MD 02/23/18 [phone]

## 2018-02-23 NOTE — ED Notes (Signed)
Patient transported to X-ray 

## 2018-02-24 ENCOUNTER — Other Ambulatory Visit: Payer: Self-pay

## 2018-02-24 ENCOUNTER — Encounter (HOSPITAL_COMMUNITY): Payer: Self-pay | Admitting: Cardiology

## 2018-02-24 DIAGNOSIS — M542 Cervicalgia: Secondary | ICD-10-CM

## 2018-02-24 DIAGNOSIS — F1410 Cocaine abuse, uncomplicated: Secondary | ICD-10-CM | POA: Diagnosis not present

## 2018-02-24 DIAGNOSIS — I200 Unstable angina: Secondary | ICD-10-CM | POA: Diagnosis not present

## 2018-02-24 DIAGNOSIS — F1920 Other psychoactive substance dependence, uncomplicated: Secondary | ICD-10-CM

## 2018-02-24 DIAGNOSIS — I472 Ventricular tachycardia: Secondary | ICD-10-CM | POA: Diagnosis not present

## 2018-02-24 DIAGNOSIS — Z9114 Patient's other noncompliance with medication regimen: Secondary | ICD-10-CM | POA: Diagnosis not present

## 2018-02-24 DIAGNOSIS — F17210 Nicotine dependence, cigarettes, uncomplicated: Secondary | ICD-10-CM | POA: Diagnosis present

## 2018-02-24 DIAGNOSIS — I272 Pulmonary hypertension, unspecified: Secondary | ICD-10-CM | POA: Diagnosis present

## 2018-02-24 DIAGNOSIS — F17200 Nicotine dependence, unspecified, uncomplicated: Secondary | ICD-10-CM

## 2018-02-24 DIAGNOSIS — F14188 Cocaine abuse with other cocaine-induced disorder: Secondary | ICD-10-CM | POA: Diagnosis present

## 2018-02-24 DIAGNOSIS — Z79899 Other long term (current) drug therapy: Secondary | ICD-10-CM | POA: Diagnosis not present

## 2018-02-24 DIAGNOSIS — R079 Chest pain, unspecified: Secondary | ICD-10-CM | POA: Diagnosis not present

## 2018-02-24 DIAGNOSIS — I5042 Chronic combined systolic (congestive) and diastolic (congestive) heart failure: Secondary | ICD-10-CM | POA: Diagnosis not present

## 2018-02-24 DIAGNOSIS — J441 Chronic obstructive pulmonary disease with (acute) exacerbation: Secondary | ICD-10-CM | POA: Diagnosis present

## 2018-02-24 DIAGNOSIS — F1020 Alcohol dependence, uncomplicated: Secondary | ICD-10-CM

## 2018-02-24 DIAGNOSIS — I5043 Acute on chronic combined systolic (congestive) and diastolic (congestive) heart failure: Secondary | ICD-10-CM | POA: Diagnosis present

## 2018-02-24 DIAGNOSIS — M4802 Spinal stenosis, cervical region: Secondary | ICD-10-CM | POA: Diagnosis present

## 2018-02-24 DIAGNOSIS — Z7951 Long term (current) use of inhaled steroids: Secondary | ICD-10-CM | POA: Diagnosis not present

## 2018-02-24 DIAGNOSIS — F10230 Alcohol dependence with withdrawal, uncomplicated: Secondary | ICD-10-CM | POA: Diagnosis not present

## 2018-02-24 DIAGNOSIS — F1210 Cannabis abuse, uncomplicated: Secondary | ICD-10-CM | POA: Diagnosis present

## 2018-02-24 DIAGNOSIS — I420 Dilated cardiomyopathy: Secondary | ICD-10-CM | POA: Diagnosis present

## 2018-02-24 DIAGNOSIS — M25512 Pain in left shoulder: Secondary | ICD-10-CM

## 2018-02-24 DIAGNOSIS — R451 Restlessness and agitation: Secondary | ICD-10-CM | POA: Diagnosis not present

## 2018-02-24 DIAGNOSIS — J9621 Acute and chronic respiratory failure with hypoxia: Secondary | ICD-10-CM | POA: Diagnosis present

## 2018-02-24 DIAGNOSIS — M79602 Pain in left arm: Secondary | ICD-10-CM | POA: Insufficient documentation

## 2018-02-24 DIAGNOSIS — Z590 Homelessness: Secondary | ICD-10-CM | POA: Diagnosis not present

## 2018-02-24 DIAGNOSIS — I110 Hypertensive heart disease with heart failure: Secondary | ICD-10-CM | POA: Diagnosis present

## 2018-02-24 DIAGNOSIS — I2510 Atherosclerotic heart disease of native coronary artery without angina pectoris: Secondary | ICD-10-CM | POA: Diagnosis present

## 2018-02-24 DIAGNOSIS — F1010 Alcohol abuse, uncomplicated: Secondary | ICD-10-CM | POA: Diagnosis present

## 2018-02-24 DIAGNOSIS — R072 Precordial pain: Secondary | ICD-10-CM | POA: Diagnosis not present

## 2018-02-24 LAB — GLUCOSE, CAPILLARY
GLUCOSE-CAPILLARY: 278 mg/dL — AB (ref 70–99)
Glucose-Capillary: 146 mg/dL — ABNORMAL HIGH (ref 70–99)
Glucose-Capillary: 161 mg/dL — ABNORMAL HIGH (ref 70–99)

## 2018-02-24 LAB — LIPID PANEL
Cholesterol: 127 mg/dL (ref 0–200)
HDL: 62 mg/dL (ref >40)
LDL CALC: 51 mg/dL (ref 0–99)
Total CHOL/HDL Ratio: 2 RATIO
Triglycerides: 69 mg/dL (ref <150)
VLDL: 14 mg/dL (ref 0–40)

## 2018-02-24 LAB — RAPID URINE DRUG SCREEN, HOSP PERFORMED
AMPHETAMINES: NOT DETECTED
BARBITURATES: NOT DETECTED
BENZODIAZEPINES: NOT DETECTED
Cocaine: POSITIVE — AB
Opiates: NOT DETECTED
Tetrahydrocannabinol: POSITIVE — AB

## 2018-02-24 LAB — TROPONIN I: TROPONIN I: 0.04 ng/mL — AB (ref <0.03)

## 2018-02-24 LAB — HEMOGLOBIN A1C
Hgb A1c MFr Bld: 5.2 % (ref 4.8–5.6)
MEAN PLASMA GLUCOSE: 102.54 mg/dL

## 2018-02-24 LAB — BRAIN NATRIURETIC PEPTIDE: B NATRIURETIC PEPTIDE 5: 523.3 pg/mL — AB (ref 0.0–100.0)

## 2018-02-24 MED ORDER — LORAZEPAM 2 MG/ML IJ SOLN
1.0000 mg | Freq: Four times a day (QID) | INTRAMUSCULAR | Status: DC | PRN
  Administered 2018-02-25: 1 mg via INTRAVENOUS
  Filled 2018-02-24: qty 1

## 2018-02-24 MED ORDER — HYDRALAZINE HCL 20 MG/ML IJ SOLN: 5.0000 mg | INTRAMUSCULAR | Status: DC | PRN

## 2018-02-24 MED ORDER — IPRATROPIUM-ALBUTEROL 0.5-2.5 (3) MG/3ML IN SOLN: 3.0000 mL | Freq: Three times a day (TID) | RESPIRATORY_TRACT | Status: DC

## 2018-02-24 MED ORDER — HYDROMORPHONE HCL 1 MG/ML IJ SOLN
1.0000 mg | Freq: Once | INTRAMUSCULAR | Status: AC
  Administered 2018-02-24: 1 mg via INTRAVENOUS
  Filled 2018-02-24: qty 1

## 2018-02-24 MED ORDER — DM-GUAIFENESIN ER 30-600 MG PO TB12: 1.0000 | ORAL_TABLET | Freq: Two times a day (BID) | ORAL | Status: DC | PRN

## 2018-02-24 MED ORDER — SODIUM CHLORIDE 0.9 % IV SOLN: 250.0000 mL | INTRAVENOUS | Status: DC | PRN

## 2018-02-24 MED ORDER — NICOTINE 21 MG/24HR TD PT24
21.0000 mg | MEDICATED_PATCH | Freq: Every day | TRANSDERMAL | Status: DC
  Administered 2018-02-24 – 2018-02-26 (×3): 21 mg via TRANSDERMAL
  Filled 2018-02-24 (×3): qty 1

## 2018-02-24 MED ORDER — METHYLPREDNISOLONE SODIUM SUCC 125 MG IJ SOLR
60.0000 mg | Freq: Three times a day (TID) | INTRAMUSCULAR | Status: DC
  Administered 2018-02-24 (×2): 60 mg via INTRAVENOUS
  Filled 2018-02-24 (×2): qty 2

## 2018-02-24 MED ORDER — LEVALBUTEROL HCL 1.25 MG/0.5ML IN NEBU
1.2500 mg | INHALATION_SOLUTION | Freq: Three times a day (TID) | RESPIRATORY_TRACT | Status: DC
  Administered 2018-02-24 (×2): 1.25 mg via RESPIRATORY_TRACT
  Filled 2018-02-24 (×2): qty 0.5

## 2018-02-24 MED ORDER — LORAZEPAM 2 MG/ML IJ SOLN
0.0000 mg | Freq: Four times a day (QID) | INTRAMUSCULAR | Status: AC
  Administered 2018-02-24 (×3): 1 mg via INTRAVENOUS
  Administered 2018-02-25 (×2): 2 mg via INTRAVENOUS
  Filled 2018-02-24 (×4): qty 1

## 2018-02-24 MED ORDER — ENOXAPARIN SODIUM 40 MG/0.4ML ~~LOC~~ SOLN
40.0000 mg | SUBCUTANEOUS | Status: DC
  Administered 2018-02-24: 40 mg via SUBCUTANEOUS
  Filled 2018-02-24: qty 0.4

## 2018-02-24 MED ORDER — DOXYCYCLINE HYCLATE 100 MG PO TABS
100.0000 mg | ORAL_TABLET | Freq: Two times a day (BID) | ORAL | Status: DC
  Administered 2018-02-24 – 2018-02-25 (×4): 100 mg via ORAL
  Filled 2018-02-24 (×4): qty 1

## 2018-02-24 MED ORDER — METHYLPREDNISOLONE SODIUM SUCC 40 MG IJ SOLR
40.0000 mg | Freq: Two times a day (BID) | INTRAMUSCULAR | Status: DC
  Administered 2018-02-24 – 2018-02-25 (×2): 40 mg via INTRAVENOUS
  Filled 2018-02-24 (×2): qty 1

## 2018-02-24 MED ORDER — ADULT MULTIVITAMIN W/MINERALS CH
1.0000 | ORAL_TABLET | Freq: Every day | ORAL | Status: DC
  Administered 2018-02-24 – 2018-02-26 (×3): 1 via ORAL
  Filled 2018-02-24 (×3): qty 1

## 2018-02-24 MED ORDER — SODIUM CHLORIDE 0.9% FLUSH: 3.0000 mL | INTRAVENOUS | Status: DC | PRN

## 2018-02-24 MED ORDER — ACETAMINOPHEN 325 MG PO TABS
650.0000 mg | ORAL_TABLET | ORAL | Status: DC | PRN
  Administered 2018-02-24: 650 mg via ORAL
  Filled 2018-02-24: qty 2

## 2018-02-24 MED ORDER — ONDANSETRON HCL 4 MG/2ML IJ SOLN: 4.0000 mg | Freq: Four times a day (QID) | INTRAMUSCULAR | Status: DC | PRN

## 2018-02-24 MED ORDER — IPRATROPIUM BROMIDE 0.02 % IN SOLN
0.5000 mg | Freq: Three times a day (TID) | RESPIRATORY_TRACT | Status: DC
  Administered 2018-02-24 (×2): 0.5 mg via RESPIRATORY_TRACT
  Filled 2018-02-24 (×2): qty 2.5

## 2018-02-24 MED ORDER — THIAMINE HCL 100 MG/ML IJ SOLN: 100.0000 mg | Freq: Every day | INTRAMUSCULAR | Status: DC

## 2018-02-24 MED ORDER — ASPIRIN 81 MG PO CHEW
81.0000 mg | CHEWABLE_TABLET | ORAL | Status: AC
  Administered 2018-02-25: 81 mg via ORAL
  Filled 2018-02-24: qty 1

## 2018-02-24 MED ORDER — IPRATROPIUM BROMIDE 0.02 % IN SOLN
0.5000 mg | Freq: Four times a day (QID) | RESPIRATORY_TRACT | Status: DC
  Filled 2018-02-24: qty 2.5

## 2018-02-24 MED ORDER — ASPIRIN EC 325 MG PO TBEC
325.0000 mg | DELAYED_RELEASE_TABLET | Freq: Every day | ORAL | Status: DC
  Filled 2018-02-24: qty 1

## 2018-02-24 MED ORDER — SODIUM CHLORIDE 0.9% FLUSH
3.0000 mL | Freq: Two times a day (BID) | INTRAVENOUS | Status: DC
  Administered 2018-02-24: 3 mL via INTRAVENOUS

## 2018-02-24 MED ORDER — FOLIC ACID 1 MG PO TABS
1.0000 mg | ORAL_TABLET | Freq: Every day | ORAL | Status: DC
  Administered 2018-02-24 – 2018-02-26 (×3): 1 mg via ORAL
  Filled 2018-02-24 (×3): qty 1

## 2018-02-24 MED ORDER — SODIUM CHLORIDE 0.9 % IV SOLN
INTRAVENOUS | Status: DC
  Administered 2018-02-25: 06:00:00 via INTRAVENOUS

## 2018-02-24 MED ORDER — ZOLPIDEM TARTRATE 5 MG PO TABS: 5.0000 mg | ORAL_TABLET | Freq: Every evening | ORAL | Status: DC | PRN

## 2018-02-24 MED ORDER — LEVALBUTEROL HCL 1.25 MG/0.5ML IN NEBU
1.2500 mg | INHALATION_SOLUTION | Freq: Three times a day (TID) | RESPIRATORY_TRACT | Status: DC
  Filled 2018-02-24: qty 0.5

## 2018-02-24 MED ORDER — FUROSEMIDE 10 MG/ML IJ SOLN
40.0000 mg | Freq: Two times a day (BID) | INTRAMUSCULAR | Status: DC
  Administered 2018-02-24 – 2018-02-26 (×5): 40 mg via INTRAVENOUS
  Filled 2018-02-24 (×5): qty 4

## 2018-02-24 MED ORDER — IPRATROPIUM-ALBUTEROL 0.5-2.5 (3) MG/3ML IN SOLN
3.0000 mL | RESPIRATORY_TRACT | Status: DC
  Administered 2018-02-24 (×2): 3 mL via RESPIRATORY_TRACT
  Filled 2018-02-24 (×2): qty 3

## 2018-02-24 MED ORDER — ASPIRIN EC 81 MG PO TBEC
81.0000 mg | DELAYED_RELEASE_TABLET | Freq: Every day | ORAL | Status: DC
  Administered 2018-02-24 – 2018-02-26 (×2): 81 mg via ORAL
  Filled 2018-02-24 (×3): qty 1

## 2018-02-24 MED ORDER — LORAZEPAM 2 MG/ML IJ SOLN
0.0000 mg | Freq: Two times a day (BID) | INTRAMUSCULAR | Status: DC
  Filled 2018-02-24: qty 1

## 2018-02-24 MED ORDER — NITROGLYCERIN 0.4 MG SL SUBL: 0.4000 mg | SUBLINGUAL_TABLET | SUBLINGUAL | Status: DC | PRN

## 2018-02-24 MED ORDER — OXYCODONE-ACETAMINOPHEN 5-325 MG PO TABS
1.0000 | ORAL_TABLET | ORAL | Status: DC | PRN
  Administered 2018-02-25: 1 via ORAL
  Filled 2018-02-24: qty 1

## 2018-02-24 MED ORDER — MOMETASONE FURO-FORMOTEROL FUM 200-5 MCG/ACT IN AERO
2.0000 | INHALATION_SPRAY | Freq: Two times a day (BID) | RESPIRATORY_TRACT | Status: DC
  Administered 2018-02-24 – 2018-02-26 (×3): 2 via RESPIRATORY_TRACT
  Filled 2018-02-24: qty 8.8

## 2018-02-24 MED ORDER — LISINOPRIL 5 MG PO TABS
5.0000 mg | ORAL_TABLET | Freq: Every day | ORAL | Status: DC
  Administered 2018-02-24 – 2018-02-26 (×3): 5 mg via ORAL
  Filled 2018-02-24 (×3): qty 1

## 2018-02-24 MED ORDER — HEPARIN SODIUM (PORCINE) 5000 UNIT/ML IJ SOLN
5000.0000 [IU] | Freq: Three times a day (TID) | INTRAMUSCULAR | Status: DC
  Administered 2018-02-24: 5000 [IU] via SUBCUTANEOUS
  Filled 2018-02-24: qty 1

## 2018-02-24 MED ORDER — LORAZEPAM 1 MG PO TABS: 1.0000 mg | ORAL_TABLET | Freq: Four times a day (QID) | ORAL | Status: DC | PRN

## 2018-02-24 MED ORDER — CARVEDILOL 6.25 MG PO TABS
6.2500 mg | ORAL_TABLET | Freq: Two times a day (BID) | ORAL | Status: DC
  Administered 2018-02-24 – 2018-02-26 (×4): 6.25 mg via ORAL
  Filled 2018-02-24 (×4): qty 1

## 2018-02-24 MED ORDER — MORPHINE SULFATE (PF) 2 MG/ML IV SOLN: 2.0000 mg | INTRAVENOUS | Status: DC | PRN

## 2018-02-24 MED ORDER — ALBUTEROL SULFATE (2.5 MG/3ML) 0.083% IN NEBU: 2.5000 mg | INHALATION_SOLUTION | RESPIRATORY_TRACT | Status: DC | PRN

## 2018-02-24 MED ORDER — ATORVASTATIN CALCIUM 80 MG PO TABS
80.0000 mg | ORAL_TABLET | Freq: Every day | ORAL | Status: DC
  Administered 2018-02-24 – 2018-02-25 (×2): 80 mg via ORAL
  Filled 2018-02-24 (×2): qty 1

## 2018-02-24 MED ORDER — VITAMIN B-1 100 MG PO TABS
100.0000 mg | ORAL_TABLET | Freq: Every day | ORAL | Status: DC
  Administered 2018-02-24 – 2018-02-26 (×3): 100 mg via ORAL
  Filled 2018-02-24 (×3): qty 1

## 2018-02-24 NOTE — Consult Note (Addendum)
Cardiology Consultation:   Patient ID: [name] MRN: [id number]; DOB: 11-09-1955  Admit date: 02/23/2018 Date of Consult: 02/24/2018  Primary Care Provider: [name], PA-C Primary Cardiologist: Nanetta Batty, MD  Primary Electrophysiologist:  None    Patient Profile:   Brian [name] is a 62 y.o. male with a hx of hypertension, COPD, polysubstance abuse (EtOH, cocaine, marijuana), chronic systolic heart failure, paroxysmal VT, noncompliance and prior history of suicide attempt who is being seen today for the evaluation of chest pain at the request of Dr. Clyde Lundborg.  History of Present Illness:   Brian [name] is a 62 year old male was past medical history of hypertension, COPD, polysubstance abuse (EtOH, cocaine, marijuana), chronic systolic heart failure, paroxysmal VT, noncompliance and prior history of suicide attempt.  Patient has had 10 admissions and 7 ED visit in the past 6 months.  He was seen by Dr. Allyson Sabal as part of chest pain evaluation in 2013 and noted to have nonsustained VT at the time.  Cardiac catheterization was recommended, however patient declined to proceed.  Symptom was found to be related to cocaine induced vasospasm.  EF was normal by echo at the time.  Patient was readmitted for pneumonia in 2017.  2D echo obtained at the time showed EF down to 25 to 30%.  He was instructed to follow-up as outpatient, however due to his homeless status, he did not proceed with outpatient care.  Cardiology was consulted again in June 2019 after he was admitted with COPD exacerbation and to have runs of VT on the telemetry.  However he left AMA prior to cardiology evaluation.  He was readmitted the following day for continued symptoms.  Repeat echocardiogram obtained on 09/3018/2019 showed mildly dilated LV, EF 30 to 35%, diffuse hypokinesis, mildly dilated RV with normal RV function, mild pulmonary hypertension.  He was treated for acute COPD exacerbation acute heart failure  exacerbation.  He was readmitted in early July with chest pain in the setting of cocaine use.  D-dimer was elevated however he declined CT scan.  He left AMA again.  He was seen by Dr. Antoine Poche and Dr. Elberta Fortis in late July due to COPD exacerbation and chest pain.  Given his noncompliance history, medical therapy was recommended as initial treatment.  He was placed on carvedilol, lisinopril and 20 mg daily of Lasix.  Lasix was later removed in late September from his medication list as he was noncompliant with this.  Patient presents yesterday to [facility] for evaluation of chest pain.  He says he was wandering toward a store when he had left-sided chest pain radiating to the left shoulder and back.  This is different from what he has experienced in the past.  He also endorses associated symptoms such as shortness of breath and the swelling.  This lasted several hours before finally going away.  Troponin was borderline elevated at 0.04.  EKG does not show any obvious ST-T wave changes.  He says he has not used cocaine for the past 3 weeks but does use marijuana on a regular basis.  He has cut back drinking to one beer per day instead of the previous 8 beers.  He denies any exacerbating factors such as deep inspiration, body rotational palpation.  He says he has not taken any medication since the last admission.  Since a month ago, he has finally moved into a house with one of his friend. Prior to that, he was homeless and lives in a tent. He  does have Medicaid.   Past Medical History:  Diagnosis Date  . Alcohol abuse    Heavy alcohol abuse and homelessness  . Arthritis   . Cardiomyopathy (HCC)   . CHF (congestive heart failure) (HCC)   . Cocaine abuse (HCC)    And crack   . COPD (chronic obstructive pulmonary disease) (HCC)   . Heart failure   . Homelessness   . Noncompliance   . Paroxysmal VT (HCC)    a. first seen 2013 in setting of normal LVEF 55-60%, EF now decreased.  . Suicide  attempt (HCC)   . Tobacco abuse     Past Surgical History:  Procedure Laterality Date  . INNER EAR SURGERY       Home Medications:  Prior to Admission medications   Medication Sig Start Date End Date Taking? Authorizing Provider  albuterol (PROVENTIL HFA;VENTOLIN HFA) 108 (90 Base) MCG/ACT inhaler Inhale 2 puffs into the lungs every 6 (six) hours as needed for wheezing or shortness of breath. 01/07/18   Joseph Art, DO  azithromycin (ZITHROMAX) 250 MG tablet Take 1 tablet (250 mg total) by mouth daily. Take first 2 tablets together, then 1 every day until finished. Patient not taking: Reported on 02/23/2018 01/15/18   Horton, Mayer Masker, MD  carvedilol (COREG) 6.25 MG tablet Take 1 tablet (6.25 mg total) by mouth 2 (two) times daily with a meal. 01/07/18 01/02/19  Joseph Art, DO  doxycycline (VIBRA-TABS) 100 MG tablet Take 1 tablet (100 mg total) by mouth 2 (two) times daily. 01/07/18   Joseph Art, DO  folic acid (FOLVITE) 1 MG tablet Take 1 tablet (1 mg total) by mouth daily. 01/07/18 07/06/18  Joseph Art, DO  lisinopril (PRINIVIL,ZESTRIL) 5 MG tablet Take 1 tablet (5 mg total) by mouth daily. 01/07/18   Joseph Art, DO  mometasone-formoterol (DULERA) 200-5 MCG/ACT AERO Inhale 2 puffs into the lungs 2 (two) times daily. 01/07/18   Joseph Art, DO  nicotine (NICODERM CQ - DOSED IN MG/24 HOURS) 21 mg/24hr patch Place 1 patch (21 mg total) onto the skin daily. Patient not taking: Reported on 01/05/2018 11/08/17   [name], PA-C  predniSONE (DELTASONE) 20 MG tablet Take 2 tablets (40 mg total) by mouth daily. 01/15/18   Shon Baton, MD    Inpatient Medications: Scheduled Meds: . aspirin EC  325 mg Oral Daily  . atorvastatin  80 mg Oral q1800  . carvedilol  6.25 mg Oral BID WC  . doxycycline  100 mg Oral Q12H  . folic acid  1 mg Oral Daily  . furosemide  40 mg Intravenous Q12H  . heparin injection (subcutaneous)  5,000 Units Subcutaneous Q8H  .  ipratropium-albuterol  3 mL Nebulization Q4H  . lisinopril  5 mg Oral Daily  . LORazepam  0-4 mg Intravenous Q6H   Followed by  . [START ON 02/26/2018] LORazepam  0-4 mg Intravenous Q12H  . methylPREDNISolone (SOLU-MEDROL) injection  60 mg Intravenous TID  . mometasone-formoterol  2 puff Inhalation BID  . multivitamin with minerals  1 tablet Oral Daily  . nicotine  21 mg Transdermal Daily  . thiamine  100 mg Oral Daily   Or  . thiamine  100 mg Intravenous Daily   Continuous Infusions:  PRN Meds: acetaminophen, albuterol, dextromethorphan-guaiFENesin, hydrALAZINE, LORazepam **OR** LORazepam, morphine injection, nitroGLYCERIN, ondansetron (ZOFRAN) IV, oxyCODONE-acetaminophen, zolpidem  Allergies:   No Known Allergies  Social History:   Social History   Socioeconomic History  .  Marital status: Divorced    Spouse name: Not on file  . Number of children: Not on file  . Years of education: Not on file  . Highest education level: Not on file  Occupational History  . Occupation: unemployed  Social Needs  . Financial resource strain: Very hard  . Food insecurity:    Worry: Sometimes true    Inability: Sometimes true  . Transportation needs:    Medical: Yes    Non-medical: Yes  Tobacco Use  . Smoking status: Current Every Day Smoker    Packs/day: 2.00    Types: Cigarettes  . Smokeless tobacco: Never Used  Substance and Sexual Activity  . Alcohol use: Yes    Comment: Heavy. 5-10 40oz daily  . Drug use: No    Types: Marijuana, Cocaine    Comment: Marijuana and cocaine, crack  . Sexual activity: Not Currently    Birth control/protection: None  Lifestyle  . Physical activity:    Days per week: 0 days    Minutes per session: 0 min  . Stress: Only a little  Relationships  . Social connections:    Talks on phone: More than three times a week    Gets together: Once a week    Attends religious service: Never    Active member of club or organization: Yes    Attends meetings  of clubs or organizations: Never    Relationship status: Divorced  . Intimate partner violence:    Fear of current or ex partner: No    Emotionally abused: No    Physically abused: No    Forced sexual activity: No  Other Topics Concern  . Not on file  Social History Narrative   Currently homeless. Has poor social support, no family, just friends and police who check on him. Heavy alcohol noted in the past, currently states he drinks "1-2 beers a day" and last drink was earlier today. States he may get minor shakes and anxiety but denies frank EtOH withdrawal, seizures, DT's. States last cocaine and MJ abuse was last week. Denies IVDU     Family History:    Family History  Problem Relation Age of Onset  . Coronary artery disease Unknown   . Diabetes Unknown   . Cancer Sister      ROS:  Please see the history of present illness.   All other ROS reviewed and negative.     Physical Exam/Data:   Vitals:   02/24/18 0331 02/24/18 0400 02/24/18 0600 02/24/18 0732  BP: (!) 147/97  126/75 140/83  Pulse: 86  68 97  Resp: 18   20  Temp: (!) 97.4 F (36.3 C)   97.9 F (36.6 C)  TempSrc: Oral   Oral  SpO2: 91% 93%  93%  Weight: 86.9 kg     Height: 5\' 7"  (1.702 m)       Intake/Output Summary (Last 24 hours) at 02/24/2018 0800 Last data filed at 02/24/2018 0700 Gross per 24 hour  Intake -  Output 300 ml  Net -300 ml   Filed Weights   02/23/18 2039 02/24/18 0331  Weight: 87.1 kg 86.9 kg   Body mass index is 29.99 kg/m.  General:  Well nourished, well developed, in no acute distress HEENT: normal Lymph: no adenopathy Neck: no JVD Endocrine:  No thryomegaly Vascular: No carotid bruits; FA pulses 2+ bilaterally without bruits  Cardiac:  normal S1, S2; RRR; no murmur  Lungs:  clear to auscultation bilaterally, no wheezing, rhonchi or  rales  Abd: soft, nontender, no hepatomegaly  Ext: no edema Musculoskeletal:  No deformities, BUE and BLE strength normal and equal Skin:  warm and dry  Neuro:  CNs 2-12 intact, no focal abnormalities noted Psych:  Normal affect   EKG:  The EKG was personally reviewed and demonstrates: Normal sinus rhythm without significant ST-T wave changes. Telemetry:  Telemetry was personally reviewed and demonstrates: Normal sinus rhythm with occasional PVC, no prolonged episode of ventricular ectopy.  Relevant CV Studies:  Echo 09/27/2017 LV EF: 30% -   35% Study Conclusions  - Left ventricle: The cavity size was mildly dilated. Wall   thickness was normal. Systolic function was moderately to   severely reduced. The estimated ejection fraction was in the   range of 30% to 35%. Diffuse hypokinesis. Features are consistent   with a pseudonormal left ventricular filling pattern, with   concomitant abnormal relaxation and increased filling pressure   (grade 2 diastolic dysfunction). - Aortic valve: There was no stenosis. - Mitral valve: There was trivial regurgitation. - Left atrium: The atrium was mildly dilated. - Right ventricle: The cavity size was mildly dilated. Systolic   function was normal. - Right atrium: The atrium was mildly to moderately dilated. - Tricuspid valve: Peak RV-RA gradient (S): 40 mm Hg. - Pulmonary arteries: PA peak pressure: 48 mm Hg (S). - Systemic veins: IVC measured 2.5 cm with > 50% respirophasic   variation, suggesting RA pressure 8 mmHg. - Pericardium, extracardiac: A trivial pericardial effusion was   identified.  Impressions:  - Mildly dilated LV with EF 30-35%, diffuse hypokinesis. Mildly   dilated RV with normal systolic function. No significant valvular   abnormalities. Mild pulmonary hypertension.  Laboratory Data:  Chemistry Recent Labs  Lab 02/23/18 2043  NA 138  K 4.0  CL 102  CO2 25  GLUCOSE 76  BUN 5*  CREATININE 0.77  CALCIUM 8.6*  GFRNONAA >60  GFRAA >60  ANIONGAP 11    No results for input(s): PROT, ALBUMIN, AST, ALT, ALKPHOS, BILITOT in the last 168  hours. Hematology Recent Labs  Lab 02/23/18 2043  WBC 6.3  RBC 5.17  HGB 16.5  HCT 53.2*  MCV 102.9*  MCH 31.9  MCHC 31.0  RDW 13.3  PLT 156   Cardiac Enzymes Recent Labs  Lab 02/24/18 0302  TROPONINI 0.04*    Recent Labs  Lab 02/23/18 2053  TROPIPOC 0.04    BNP Recent Labs  Lab 02/24/18 0302  BNP 523.3*    DDimer No results for input(s): DDIMER in the last 168 hours.  Radiology/Studies:  Dg Chest 2 View  Result Date: 02/23/2018 CLINICAL DATA:  Acute onset of sharp left-sided chest pain, radiating to the left shoulder and left arm. Left hand numbness. Dizziness. EXAM: CHEST - 2 VIEW COMPARISON:  Chest radiograph performed 02/11/2018 FINDINGS: The lungs are well-aerated. Minimal bibasilar opacity likely reflects atelectasis. There is no evidence of pleural effusion or pneumothorax. The heart is mildly enlarged. No acute osseous abnormalities are seen. IMPRESSION: Minimal bibasilar opacity likely reflects atelectasis. Mild cardiomegaly. Electronically Signed   By: Roanna Raider M.D.   On: 02/23/2018 21:21   Mr Cervical Spine Wo Contrast  Result Date: 02/24/2018 CLINICAL DATA:  Neck pain and left arm pain. Left finger tingling for 2 weeks. EXAM: MRI CERVICAL SPINE WITHOUT CONTRAST TECHNIQUE: Multiplanar, multisequence MR imaging of the cervical spine was performed. No intravenous contrast was administered. COMPARISON:  None. FINDINGS: The examination is markedly motion degraded, particularly on the  axial images, which limits assessment of the spinal canal and neural foramina. Alignment: Normal. Vertebrae: There are Modic type 1 degenerative endplate signal changes at C3-4. No acute compression fracture or discitis-osteomyelitis. Cord: Normal caliber and signal. Posterior Fossa, vertebral arteries, paraspinal tissues: Visualized posterior fossa is normal. Vertebral artery flow voids are preserved. No prevertebral effusion. Disc levels: Sagittal imaging includes the  atlantoaxial joint to the level of the T3-4 disc space, with axial imaging of the disc spaces from C2-3 to C7-T1. C1-2: Normal. C2-3: Normal. C3-4: Small disc bulge with bilateral uncovertebral hypertrophy. Mild spinal canal stenosis. Moderate bilateral neural foraminal stenosis. Normal facets. C4-5: Small disc bulge with normal facets. No central spinal canal stenosis or neural foraminal stenosis. C5-6: Markedly motion degraded. No high-grade spinal canal stenosis. C6-7: Markedly motion degraded. No high-grade spinal canal stenosis. C7-T1: Normal. Sagittal imaging of the T1-T4 disc levels shows no spinal canal or neural foraminal stenosis. IMPRESSION: 1. Motion degraded examination, decreasing detailed assessment of the spinal canal and neural foramina on the axial images. 2. C3-4 mild spinal canal and moderate bilateral neural foraminal stenosis. 3. Otherwise, mild midcervical degenerative disc disease without high-grade stenosis, degraded by motion. Electronically Signed   By: Deatra Robinson M.D.   On: 02/24/2018 01:02    Assessment and Plan:   1.  Chest pain: Despite prolonged chest pain, first troponin was borderline elevated.  We will continue to trend troponin.  Agree with repeating echocardiogram.    2.  Chronic systolic heart failure: Does not appears to be volume overloaded despite the fact the patient has not taken any of the Lasix since last admission.  Restart carvedilol and lisinopril.  3.  COPD: He has markedly diminished breath sound on physical exam, however no active wheezing  4.  Polysubstance abuse: Uses marijuana, cocaine, and EtOH.  Last cocaine use was 3 weeks ago per patient  5.  Paroxysmal VT: Initially evaluated in 2013 for the same.  He has not been taking his carvedilol recently, will need to restart.  On telemetry, he does not have any prolonged episode, the longest run was 3 beats.      For questions or updates, please contact [facility] Please consult [email or web address]  for contact info under     Signed, Azalee Course, PA  02/24/2018 8:00 AM   This is a 62 year old male who has previously been homeless with polysubstance abuse including alcohol, cocaine and marijuana.  He has had multiple admissions to the hospital and in the past month has had 10 admissions and 7 ED visits.  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  He has also had a prior history of suicide attempt.    He initially presented in 2013 with chest pain and nonsustained VT but refused cardiac cath and symptoms were felt to be secondary to cocaine induced vasospasm as LV function was normal at that time.  He was then diagnosed with dilated cardiomyopathy with EF 25 to 30% in 2017 in the setting of pneumonia but did not follow-up with cardiology.  Repeat echo 10/07/2017 for COPD exacerbation and nonsustained VT showed EF 30 to 35% and mild pulmonary hypertension.  Readmitted 10/2017 with chest pain and cocaine use and left AMA.  Readmitted later in July and cardiology recommended medical therapy initially due to noncompliance.  Admitted yesterday after developing left-sided chest pain with radiation to the left shoulder and  back while walking to a store.  He had associated shortness of breath.  The pain lasted several hours and then resolved.  His troponin was 0.04 in admission.  EKG did not show any acute ST changes.  He denies any cocaine use in the past 3 weeks.  He continues to use marijuana.  Cardiology is now evaluating.  GEN: Well nourished, well developed in no acute distress HEENT: Normal NECK: No JVD; No carotid bruits LYMPHATICS: No lymphadenopathy CARDIAC:RRR, no murmurs, rubs, gallops RESPIRATORY:  Clear to auscultation without rales, wheezing or rhonchi  ABDOMEN: Soft, non-tender, non-distended MUSCULOSKELETAL:  No edema; No deformity  SKIN: Warm  and dry NEUROLOGIC:  Alert and oriented x 3 PSYCHIATRIC:  Normal affect   I personally reviewed twelve-lead EKG which showed normal sinus rhythm with no acute ST changes.  A 0.04 and less than 0.03.  BNP is mildly elevated at 523.  Creatinine 0.77.  Chest x-ray showed minimal basilar opacities with mild cardiomegaly.  Suspect the patient likely has some underlying CAD.  His chest pain in the past has been felt to be due to cocaine use but he has not used any cocaine in the past 8 weeks.  EKG is nonischemic.    I had a long talk with him today and recommended that given so many admissions in the past 6 months that we really need to determine if there is any underlying CAD present.  His social situation has changed some and he is now living in a home with another person.  He is tried to make some lifestyle modifications including no cocaine in the past 3 weeks as well as cutting back his alcohol use 1 beer per day instead of the prior 8 beers per day.  He also has received his Medicaid and can get medications.  He says that he also is following for disability.  I did inform him that if we proceed with invasive work-up and he was found to have CAD that was amenable to PCI he had to be willing to be compliant with medications.  Recommend that we proceed with right and left cardiac cath tomorrow to define coronary anatomy and assess filling pressures and then can make decisions going forward based on the findings.  Continue aspirin but decrease to 81 mg daily as well as high-dose Lipitor 80 mg daily and beta-blocker.  He does not appear to be volume overloaded on exam today.  He is currently on Lasix 40 mg IV twice daily which I recommend continuing for today and reassess tomorrow at the time of cath.  He has put out 600 cc thus far.  He has been placed on carvedilol 6.25 mg twice daily and lisinopril 5 mg daily.    Cardiac catheterization was discussed with the patient fully. The patient understands that risks  include but are not limited to stroke (1 in 1000), death (1 in 1000), kidney failure [usually temporary] (1 in 500), bleeding (1 in 200), allergic reaction [possibly serious] (1 in 200).  The patient understands and is willing to proceed.

## 2018-02-24 NOTE — ED Notes (Signed)
Admitting provider bedside 

## 2018-02-24 NOTE — Progress Notes (Signed)
Pt was threatening to leave if he was not able to eat, he has not eaten in a couple of days, MD explained to pt that he may go for a stress test tom am, pt was not concerned with the stress test, but just wanted to eat, MD gave orders for RN to order [facility] diet, will continue to monitor, Thanks Lavonda Jumbo RN.

## 2018-02-24 NOTE — Progress Notes (Signed)
Troponin 0.04, no s/s, MD notified will continue to monitor, Thanks Lavonda Jumbo RN.

## 2018-02-24 NOTE — Progress Notes (Signed)
In Progress  02/24/18 8:53 PM [place], Virgel Paling, RN  IVT consulted to start IV for cath tomorrow. This RN attempted x 1 to the RAC. Unable to thread. Suggested the next attempt be with [place] and patient refused. Primary RN informed.

## 2018-02-24 NOTE — Progress Notes (Signed)
PROGRESS NOTE    Brian [name]  WUJ:[id number] DOB: 02/10/1956 DOA: 02/23/2018 PCP: [name], PA-C   Brief Narrative:  62 year old with history of essential hypertension, systolic CHF ejection fraction 30%, polysubstance abuse, COPD came to the hospital with complains of chest pain and shoulder pain.  Initially was slightly noted to be slightly hypoxic 91% on room air.  Chest x-ray showed atelectasis.  His cardiac enzymes remain negative and cardiology was consulted.  Given his risk factors it was determined he would benefit from left heart catheterization.   Assessment & Plan:   Principal Problem:   Chest pain Active Problems:   Homelessness   Cocaine abuse (HCC)   Polysubstance (excluding opioids) dependence, daily use (HCC)   Alcohol dependence (HCC)   Acute on chronic respiratory failure with hypoxia (HCC)   Tobacco dependence   COPD exacerbation (HCC)   Acute on chronic combined systolic and diastolic CHF (congestive heart failure) (HCC)   Left shoulder pain   Neck pain on left side  Atypical chest pain, improved Acute on chronic combined systolic and diastolic congestive heart failure, ejection fraction 30% with grade 2 diastolic dysfunction, class III - Cardiac enzymes have remained negative but given concerns for the nature of his chest pain and being high risk cardiology plans on performing left heart catheterization tomorrow. - Check echocardiogram, continue Lasix 40 mg IV twice daily and reassess daily for the need of this. -Continue to provide supportive care.  Aspirin and statin.  On Coreg and lisinopril  Acute exacerbation of mild persistent COPD Tobacco use -Counseled to quit using tobacco - Continue bronchodilators.  Solu-Medrol 40 mg twice daily -Incentive spirometry and flutter valve added -Continue Dulera -Nicotine patch -Supplemental oxygen as needed.  Currently on 3 L nasal cannula.  Plan to wean this  Polysubstance abuse including  alcohol - Counseled to quit using this.  Nicotine patch -Alcohol withdrawal protocol in place  Shoulder pain and bilateral finger numbness -MRI of the C-spine showed C3-4 mild spinal canal and moderate bilateral neural foraminal stenosis.  Pain control   DVT prophylaxis: Subcutaneous Lovenox Code Status: Full code Family Communication: None Disposition Plan: Patient will require at least another 24 to 48 hours of inpatient stay until left heart catheterization and further plan is made  Consultants:   Cardiology  Procedures:   None  Antimicrobials:   None   Subjective: States he has some exertional shortness of breath this morning.  Chest pain slightly improved from yesterday evening.  Review of Systems Otherwise negative except as per HPI, including: General: Denies fever, chills, night sweats or unintended weight loss. Resp: Denies cough, wheezing Cardiac: Deniespalpitations, orthopnea, paroxysmal nocturnal dyspnea. GI: Denies abdominal pain, nausea, vomiting, diarrhea or constipation GU: Denies dysuria, frequency, hesitancy or incontinence MS: Denies muscle aches, joint pain or swelling Neuro: Denies headache, neurologic deficits (focal weakness, numbness, tingling), abnormal gait Psych: Denies anxiety, depression, SI/HI/AVH Skin: Denies new rashes or lesions ID: Denies sick contacts, exotic exposures, travel  Objective: Vitals:   02/24/18 0400 02/24/18 0600 02/24/18 0732 02/24/18 0816  BP:  126/75 140/83   Pulse:  68 97   Resp:   20   Temp:   97.9 F (36.6 C)   TempSrc:   Oral   SpO2: 93%  93% 93%  Weight:      Height:        Intake/Output Summary (Last 24 hours) at 02/24/2018 1118 Last data filed at 02/24/2018 1014 Gross per 24 hour  Intake -  Output 800 ml  Net -800 ml   Filed Weights   02/23/18 2039 02/24/18 0331  Weight: 87.1 kg 86.9 kg    Examination:  General exam: Appears calm and comfortable, on 3 L nasal cannula Respiratory system:  Diffuse coarse breath sounds Cardiovascular system: S1 & S2 heard, RRR. No JVD, murmurs, rubs, gallops or clicks. No pedal edema. Gastrointestinal system: Abdomen is nondistended, soft and nontender. No organomegaly or masses felt. Normal bowel sounds heard. Central nervous system: Alert and oriented. No focal neurological deficits. Extremities: Symmetric 5 x 5 power. Skin: No rashes, lesions or ulcers Psychiatry: Judgement and insight appear normal. Mood & affect appropriate.     Data Reviewed:   CBC: Recent Labs  Lab 02/23/18 2043  WBC 6.3  HGB 16.5  HCT 53.2*  MCV 102.9*  PLT 156   Basic Metabolic Panel: Recent Labs  Lab 02/23/18 2043  NA 138  K 4.0  CL 102  CO2 25  GLUCOSE 76  BUN 5*  CREATININE 0.77  CALCIUM 8.6*   GFR: Estimated Creatinine Clearance: 103.3 mL/min (by C-G formula based on SCr of 0.77 mg/dL). Liver Function Tests: No results for input(s): AST, ALT, ALKPHOS, BILITOT, PROT, ALBUMIN in the last 168 hours. No results for input(s): LIPASE, AMYLASE in the last 168 hours. No results for input(s): AMMONIA in the last 168 hours. Coagulation Profile: No results for input(s): INR, PROTIME in the last 168 hours. Cardiac Enzymes: Recent Labs  Lab 02/24/18 0302 02/24/18 0736  TROPONINI 0.04* <0.03   BNP (last 3 results) No results for input(s): PROBNP in the last 8760 hours. HbA1C: Recent Labs    02/24/18 0302  HGBA1C 5.2   CBG: No results for input(s): GLUCAP in the last 168 hours. Lipid Profile: Recent Labs    02/24/18 0302  CHOL 127  HDL 62  LDLCALC 51  TRIG 69  CHOLHDL 2.0   Thyroid Function Tests: No results for input(s): TSH, T4TOTAL, FREET4, T3FREE, THYROIDAB in the last 72 hours. Anemia Panel: No results for input(s): VITAMINB12, FOLATE, FERRITIN, TIBC, IRON, RETICCTPCT in the last 72 hours. Sepsis Labs: No results for input(s): PROCALCITON, LATICACIDVEN in the last 168 hours.  No results found for this or any previous visit  (from the past 240 hour(s)).       Radiology Studies: Dg Chest 2 View  Result Date: 02/23/2018 CLINICAL DATA:  Acute onset of sharp left-sided chest pain, radiating to the left shoulder and left arm. Left hand numbness. Dizziness. EXAM: CHEST - 2 VIEW COMPARISON:  Chest radiograph performed 02/11/2018 FINDINGS: The lungs are well-aerated. Minimal bibasilar opacity likely reflects atelectasis. There is no evidence of pleural effusion or pneumothorax. The heart is mildly enlarged. No acute osseous abnormalities are seen. IMPRESSION: Minimal bibasilar opacity likely reflects atelectasis. Mild cardiomegaly. Electronically Signed   By: Roanna Raider M.D.   On: 02/23/2018 21:21   Mr Cervical Spine Wo Contrast  Result Date: 02/24/2018 CLINICAL DATA:  Neck pain and left arm pain. Left finger tingling for 2 weeks. EXAM: MRI CERVICAL SPINE WITHOUT CONTRAST TECHNIQUE: Multiplanar, multisequence MR imaging of the cervical spine was performed. No intravenous contrast was administered. COMPARISON:  None. FINDINGS: The examination is markedly motion degraded, particularly on the axial images, which limits assessment of the spinal canal and neural foramina. Alignment: Normal. Vertebrae: There are Modic type 1 degenerative endplate signal changes at C3-4. No acute compression fracture or discitis-osteomyelitis. Cord: Normal caliber and signal. Posterior Fossa, vertebral arteries, paraspinal tissues: Visualized posterior fossa is  normal. Vertebral artery flow voids are preserved. No prevertebral effusion. Disc levels: Sagittal imaging includes the atlantoaxial joint to the level of the T3-4 disc space, with axial imaging of the disc spaces from C2-3 to C7-T1. C1-2: Normal. C2-3: Normal. C3-4: Small disc bulge with bilateral uncovertebral hypertrophy. Mild spinal canal stenosis. Moderate bilateral neural foraminal stenosis. Normal facets. C4-5: Small disc bulge with normal facets. No central spinal canal stenosis or  neural foraminal stenosis. C5-6: Markedly motion degraded. No high-grade spinal canal stenosis. C6-7: Markedly motion degraded. No high-grade spinal canal stenosis. C7-T1: Normal. Sagittal imaging of the T1-T4 disc levels shows no spinal canal or neural foraminal stenosis. IMPRESSION: 1. Motion degraded examination, decreasing detailed assessment of the spinal canal and neural foramina on the axial images. 2. C3-4 mild spinal canal and moderate bilateral neural foraminal stenosis. 3. Otherwise, mild midcervical degenerative disc disease without high-grade stenosis, degraded by motion. Electronically Signed   By: Deatra Robinson M.D.   On: 02/24/2018 01:02        Scheduled Meds: . aspirin EC  81 mg Oral Daily  . atorvastatin  80 mg Oral q1800  . carvedilol  6.25 mg Oral BID WC  . doxycycline  100 mg Oral Q12H  . folic acid  1 mg Oral Daily  . furosemide  40 mg Intravenous Q12H  . heparin injection (subcutaneous)  5,000 Units Subcutaneous Q8H  . ipratropium-albuterol  3 mL Nebulization TID  . lisinopril  5 mg Oral Daily  . LORazepam  0-4 mg Intravenous Q6H   Followed by  . [START ON 02/26/2018] LORazepam  0-4 mg Intravenous Q12H  . methylPREDNISolone (SOLU-MEDROL) injection  60 mg Intravenous TID  . mometasone-formoterol  2 puff Inhalation BID  . multivitamin with minerals  1 tablet Oral Daily  . nicotine  21 mg Transdermal Daily  . thiamine  100 mg Oral Daily   Or  . thiamine  100 mg Intravenous Daily   Continuous Infusions:   LOS: 0 days   Time spent= 30 mins    Rhyatt Muska Joline Maxcy, MD [facility] Pager [phone]   If 7PM-7AM, please contact night-coverage [email or web address] Password [email or web address] 02/24/2018, 11:18 AM

## 2018-02-24 NOTE — ED Notes (Signed)
Patient transported to MRI 

## 2018-02-25 ENCOUNTER — Encounter (HOSPITAL_COMMUNITY)
Admission: EM | Disposition: A | Discharge status: Home / Self Care | Payer: Self-pay | Source: Home / Self Care | Attending: Internal Medicine

## 2018-02-25 DIAGNOSIS — I5043 Acute on chronic combined systolic (congestive) and diastolic (congestive) heart failure: Secondary | ICD-10-CM

## 2018-02-25 DIAGNOSIS — R072 Precordial pain: Secondary | ICD-10-CM

## 2018-02-25 DIAGNOSIS — I200 Unstable angina: Secondary | ICD-10-CM

## 2018-02-25 DIAGNOSIS — Z590 Homelessness: Secondary | ICD-10-CM

## 2018-02-25 DIAGNOSIS — J441 Chronic obstructive pulmonary disease with (acute) exacerbation: Secondary | ICD-10-CM

## 2018-02-25 DIAGNOSIS — F10230 Alcohol dependence with withdrawal, uncomplicated: Secondary | ICD-10-CM

## 2018-02-25 DIAGNOSIS — J9621 Acute and chronic respiratory failure with hypoxia: Secondary | ICD-10-CM

## 2018-02-25 DIAGNOSIS — R0902 Hypoxemia: Secondary | ICD-10-CM

## 2018-02-25 DIAGNOSIS — I2510 Atherosclerotic heart disease of native coronary artery without angina pectoris: Secondary | ICD-10-CM

## 2018-02-25 DIAGNOSIS — I5042 Chronic combined systolic (congestive) and diastolic (congestive) heart failure: Secondary | ICD-10-CM

## 2018-02-25 HISTORY — PX: LEFT HEART CATH AND CORONARY ANGIOGRAPHY: CATH118249

## 2018-02-25 LAB — GLUCOSE, CAPILLARY
GLUCOSE-CAPILLARY: 124 mg/dL — AB (ref 70–99)
GLUCOSE-CAPILLARY: 132 mg/dL — AB (ref 70–99)
Glucose-Capillary: 191 mg/dL — ABNORMAL HIGH (ref 70–99)
Glucose-Capillary: 119 mg/dL — ABNORMAL HIGH (ref 70–99)

## 2018-02-25 SURGERY — LEFT HEART CATH AND CORONARY ANGIOGRAPHY
Anesthesia: LOCAL

## 2018-02-25 MED ORDER — LIDOCAINE HCL (PF) 1 % IJ SOLN
INTRAMUSCULAR | Status: DC | PRN
  Administered 2018-02-25: 2 mL

## 2018-02-25 MED ORDER — SODIUM CHLORIDE 0.9 % IV SOLN
INTRAVENOUS | Status: AC | PRN
  Administered 2018-02-25: 10 mL/h via INTRAVENOUS

## 2018-02-25 MED ORDER — MIDAZOLAM HCL 2 MG/2ML IJ SOLN
INTRAMUSCULAR | Status: DC | PRN
  Administered 2018-02-25: 1 mg via INTRAVENOUS

## 2018-02-25 MED ORDER — SODIUM CHLORIDE 0.9 % IV SOLN: 250.0000 mL | INTRAVENOUS | Status: DC | PRN

## 2018-02-25 MED ORDER — HEPARIN (PORCINE) IN NACL 1000-0.9 UT/500ML-% IV SOLN
INTRAVENOUS | Status: AC
  Filled 2018-02-25: qty 1000

## 2018-02-25 MED ORDER — IOHEXOL 350 MG/ML SOLN
INTRAVENOUS | Status: DC | PRN
  Administered 2018-02-25: 70 mL via INTRAVENOUS

## 2018-02-25 MED ORDER — SODIUM CHLORIDE 0.9% FLUSH
3.0000 mL | Freq: Two times a day (BID) | INTRAVENOUS | Status: DC
  Administered 2018-02-26: 3 mL via INTRAVENOUS

## 2018-02-25 MED ORDER — FENTANYL CITRATE (PF) 100 MCG/2ML IJ SOLN
INTRAMUSCULAR | Status: DC | PRN
  Administered 2018-02-25: 25 ug via INTRAVENOUS

## 2018-02-25 MED ORDER — VERAPAMIL HCL 2.5 MG/ML IV SOLN
INTRAVENOUS | Status: DC | PRN
  Administered 2018-02-25: 10 mL via INTRA_ARTERIAL

## 2018-02-25 MED ORDER — LEVALBUTEROL HCL 0.63 MG/3ML IN NEBU: 0.6300 mg | INHALATION_SOLUTION | Freq: Four times a day (QID) | RESPIRATORY_TRACT | Status: DC | PRN

## 2018-02-25 MED ORDER — MIDAZOLAM HCL 2 MG/2ML IJ SOLN
INTRAMUSCULAR | Status: AC
  Filled 2018-02-25: qty 2

## 2018-02-25 MED ORDER — HEPARIN SODIUM (PORCINE) 1000 UNIT/ML IJ SOLN
INTRAMUSCULAR | Status: AC
  Filled 2018-02-25: qty 1

## 2018-02-25 MED ORDER — VERAPAMIL HCL 2.5 MG/ML IV SOLN
INTRAVENOUS | Status: AC
  Filled 2018-02-25: qty 2

## 2018-02-25 MED ORDER — HEPARIN SODIUM (PORCINE) 5000 UNIT/ML IJ SOLN: 5000.0000 [IU] | Freq: Three times a day (TID) | INTRAMUSCULAR | Status: DC

## 2018-02-25 MED ORDER — HEPARIN (PORCINE) IN NACL 1000-0.9 UT/500ML-% IV SOLN
INTRAVENOUS | Status: DC | PRN
  Administered 2018-02-25 (×2): 500 mL

## 2018-02-25 MED ORDER — PREDNISONE 20 MG PO TABS
40.0000 mg | ORAL_TABLET | Freq: Every day | ORAL | Status: DC
  Administered 2018-02-26: 40 mg via ORAL
  Filled 2018-02-25: qty 2

## 2018-02-25 MED ORDER — LIDOCAINE HCL (PF) 1 % IJ SOLN
INTRAMUSCULAR | Status: AC
  Filled 2018-02-25: qty 30

## 2018-02-25 MED ORDER — SODIUM CHLORIDE 0.9 % IV SOLN: INTRAVENOUS | Status: AC

## 2018-02-25 MED ORDER — SODIUM CHLORIDE 0.9% FLUSH: 3.0000 mL | INTRAVENOUS | Status: DC | PRN

## 2018-02-25 MED ORDER — HEPARIN SODIUM (PORCINE) 1000 UNIT/ML IJ SOLN
INTRAMUSCULAR | Status: DC | PRN
  Administered 2018-02-25: 4500 [IU] via INTRAVENOUS

## 2018-02-25 MED ORDER — FENTANYL CITRATE (PF) 100 MCG/2ML IJ SOLN
INTRAMUSCULAR | Status: AC
  Filled 2018-02-25: qty 2

## 2018-02-25 SURGICAL SUPPLY — 9 items

## 2018-02-25 NOTE — H&P (View-Only) (Signed)
Progress Note  Patient Name: Brian [name] Date of Encounter: 02/25/2018  Primary Cardiologist: Nanetta Batty, MD   Subjective   Denies chest pain, no dyspnea lying flat.  Otherwise not very communicative.  Tells me he has "not made his mind up" whether he wants to have a heart catheterization or not. 13 beat run of nonsustained VT early this morning asymptomatic.  Inpatient Medications    Scheduled Meds: . aspirin EC  81 mg Oral Daily  . atorvastatin  80 mg Oral q1800  . carvedilol  6.25 mg Oral BID WC  . doxycycline  100 mg Oral Q12H  . folic acid  1 mg Oral Daily  . furosemide  40 mg Intravenous Q12H  . heparin injection (subcutaneous)  5,000 Units Subcutaneous Q8H  . ipratropium  0.5 mg Inhalation TID  . levalbuterol  1.25 mg Nebulization TID  . lisinopril  5 mg Oral Daily  . LORazepam  0-4 mg Intravenous Q6H   Followed by  . [START ON 02/26/2018] LORazepam  0-4 mg Intravenous Q12H  . methylPREDNISolone (SOLU-MEDROL) injection  40 mg Intravenous Q12H  . mometasone-formoterol  2 puff Inhalation BID  . multivitamin with minerals  1 tablet Oral Daily  . nicotine  21 mg Transdermal Daily  . sodium chloride flush  3 mL Intravenous Q12H  . thiamine  100 mg Oral Daily   Or  . thiamine  100 mg Intravenous Daily   Continuous Infusions: . sodium chloride    . sodium chloride 10 mL/hr at 02/25/18 0624   PRN Meds: sodium chloride, acetaminophen, dextromethorphan-guaiFENesin, hydrALAZINE, LORazepam **OR** LORazepam, morphine injection, nitroGLYCERIN, ondansetron (ZOFRAN) IV, oxyCODONE-acetaminophen, sodium chloride flush, zolpidem   Vital Signs    Vitals:   02/24/18 1700 02/24/18 1940 02/25/18 0014 02/25/18 0340  BP: (!) 151/96 (!) 148/83 140/87 (!) 156/105  Pulse: 81 84 (!) 58 77  Resp: 20 20 20 20   Temp: 98.1 F (36.7 C) 98.4 F (36.9 C) 98.6 F (37 C) 98 F (36.7 C)  TempSrc: Oral Oral Oral Oral  SpO2: 92% 96% 95% 93%  Weight:      Height:         Intake/Output Summary (Last 24 hours) at 02/25/2018 0905 Last data filed at 02/25/2018 0700 Gross per 24 hour  Intake 1680 ml  Output 3375 ml  Net -1695 ml   Filed Weights   02/23/18 2039 02/24/18 0331  Weight: 87.1 kg 86.9 kg    Telemetry    Sinus rhythm, nonsustained VT around 4 AM (13 beats)- Personally Reviewed  ECG    Sinus rhythm, mildly prolonged QT interval (484 ms), otherwise no repolarization changes- Personally Reviewed  Physical Exam  Poorly kempt, not very communicative, does not make eye contact GEN: No acute distress.   Neck: No JVD Cardiac: RRR, no murmurs, rubs, or gallops.  Respiratory: Clear to auscultation bilaterally, with markedly diminished breath sounds throughout GI: Soft, nontender, non-distended  MS: No edema; No deformity. Neuro:  Nonfocal  Psych: Normal affect   Labs    Chemistry Recent Labs  Lab 02/23/18 2043  NA 138  K 4.0  CL 102  CO2 25  GLUCOSE 76  BUN 5*  CREATININE 0.77  CALCIUM 8.6*  GFRNONAA >60  GFRAA >60  ANIONGAP 11     Hematology Recent Labs  Lab 02/23/18 2043  WBC 6.3  RBC 5.17  HGB 16.5  HCT 53.2*  MCV 102.9*  MCH 31.9  MCHC 31.0  RDW 13.3  PLT 156  Cardiac Enzymes Recent Labs  Lab 02/24/18 0302 02/24/18 0736 02/24/18 1402  TROPONINI 0.04* <0.03 <0.03    Recent Labs  Lab 02/23/18 2053  TROPIPOC 0.04     BNP Recent Labs  Lab 02/24/18 0302  BNP 523.3*     DDimer No results for input(s): DDIMER in the last 168 hours.   Radiology    Dg Chest 2 View  Result Date: 02/23/2018 CLINICAL DATA:  Acute onset of sharp left-sided chest pain, radiating to the left shoulder and left arm. Left hand numbness. Dizziness. EXAM: CHEST - 2 VIEW COMPARISON:  Chest radiograph performed 02/11/2018 FINDINGS: The lungs are well-aerated. Minimal bibasilar opacity likely reflects atelectasis. There is no evidence of pleural effusion or pneumothorax. The heart is mildly enlarged. No acute osseous  abnormalities are seen. IMPRESSION: Minimal bibasilar opacity likely reflects atelectasis. Mild cardiomegaly. Electronically Signed   By: Roanna Raider M.D.   On: 02/23/2018 21:21   Mr Cervical Spine Wo Contrast  Result Date: 02/24/2018 CLINICAL DATA:  Neck pain and left arm pain. Left finger tingling for 2 weeks. EXAM: MRI CERVICAL SPINE WITHOUT CONTRAST TECHNIQUE: Multiplanar, multisequence MR imaging of the cervical spine was performed. No intravenous contrast was administered. COMPARISON:  None. FINDINGS: The examination is markedly motion degraded, particularly on the axial images, which limits assessment of the spinal canal and neural foramina. Alignment: Normal. Vertebrae: There are Modic type 1 degenerative endplate signal changes at C3-4. No acute compression fracture or discitis-osteomyelitis. Cord: Normal caliber and signal. Posterior Fossa, vertebral arteries, paraspinal tissues: Visualized posterior fossa is normal. Vertebral artery flow voids are preserved. No prevertebral effusion. Disc levels: Sagittal imaging includes the atlantoaxial joint to the level of the T3-4 disc space, with axial imaging of the disc spaces from C2-3 to C7-T1. C1-2: Normal. C2-3: Normal. C3-4: Small disc bulge with bilateral uncovertebral hypertrophy. Mild spinal canal stenosis. Moderate bilateral neural foraminal stenosis. Normal facets. C4-5: Small disc bulge with normal facets. No central spinal canal stenosis or neural foraminal stenosis. C5-6: Markedly motion degraded. No high-grade spinal canal stenosis. C6-7: Markedly motion degraded. No high-grade spinal canal stenosis. C7-T1: Normal. Sagittal imaging of the T1-T4 disc levels shows no spinal canal or neural foraminal stenosis. IMPRESSION: 1. Motion degraded examination, decreasing detailed assessment of the spinal canal and neural foramina on the axial images. 2. C3-4 mild spinal canal and moderate bilateral neural foraminal stenosis. 3. Otherwise, mild  midcervical degenerative disc disease without high-grade stenosis, degraded by motion. Electronically Signed   By: Deatra Robinson M.D.   On: 02/24/2018 01:02    Cardiac Studies   Echo September 27, 2017 - Left ventricle: The cavity size was mildly dilated. Wall   thickness was normal. Systolic function was moderately to   severely reduced. The estimated ejection fraction was in the   range of 30% to 35%. Diffuse hypokinesis. Features are consistent   with a pseudonormal left ventricular filling pattern, with   concomitant abnormal relaxation and increased filling pressure   (grade 2 diastolic dysfunction). - Aortic valve: There was no stenosis. - Mitral valve: There was trivial regurgitation. - Left atrium: The atrium was mildly dilated. - Right ventricle: The cavity size was mildly dilated. Systolic   function was normal. - Right atrium: The atrium was mildly to moderately dilated. - Tricuspid valve: Peak RV-RA gradient (S): 40 mm Hg. - Pulmonary arteries: PA peak pressure: 48 mm Hg (S). - Systemic veins: IVC measured 2.5 cm with > 50% respirophasic   variation, suggesting RA pressure  8 mmHg. - Pericardium, extracardiac: A trivial pericardial effusion was   identified.  Impressions:  - Mildly dilated LV with EF 30-35%, diffuse hypokinesis. Mildly   dilated RV with normal systolic function. No significant valvular   abnormalities. Mild pulmonary hypertension.   Patient Profile     62 y.o. male with chronic heart failure due to moderately depressed left ventricular systolic function, known recurrent nonsustained VT, history of OPD, hypertension, polysubstance abuse (including alcohol and cocaine) and chronic noncompliance  Assessment & Plan    1. CHF: LV systolic function has been depressed at least since 2013 and he does not appear to be overtly hypervolemic today, despite noncompliance with medication. 2. Chest pain: he has had recurrent problems with chest pain, at one point  felt to be related to cocaine induced vasospasm.  Despite previous recommendations for cardiac catheterization, he has never waited in the hospital long enough to have the procedure and has left AMA.  He does not have any high risk ECG changes, other than prolonged QT interval, troponin is borderline at 0.04.  I tried to explain the catheterization procedure to him, he did not seem particularly interested.  My suspicion is he will leave AMA again. 3.  Substance abuse: Reports that his last cocaine use was 3 weeks ago.  Uses marijuana daily.  Reports that he has "cut back" with alcohol intake to just 1 beer a day, but he is very restless and I suspect he is having symptoms of withdrawal right now.  This probably also explains his elevated blood pressure.   4. NSVT: Hyperadrenergic state related to withdrawal and some degree of beta-blocker rebound (carvedilol restarted) is likely to cause further episodes of nonsustained VT.     For questions or updates, please contact [facility] Please consult [email or web address] for contact info under        Signed, Thurmon Fair, MD  02/25/2018, 9:05 AM

## 2018-02-25 NOTE — Progress Notes (Signed)
Pt had 13 bts of NSVT, has hx of this as well, pt was agitated, wanting to leave, RN was able to calm down pt and gave him IV med(CIWA), notified on call MD, will continue to monitor, Thanks Lavonda Jumbo RN.

## 2018-02-25 NOTE — Progress Notes (Signed)
CM talked to patient about disposition; he stated that he was staying with his Niece prior to admission and that he could probably go back to her house; has private insurance with Medicaid, pharmacy of choice is [facility]; Alexis Goodell [phone]

## 2018-02-25 NOTE — Progress Notes (Signed)
Patient refusing all breathing treatments.

## 2018-02-25 NOTE — Interval H&P Note (Signed)
History and Physical Interval Note:  02/25/2018 4:23 PM  Brian [name]  has presented today for cardiac catheterization with the diagnosis of unstable angina, cardiomyopathy. The various methods of treatment have been discussed with the patient and family. After consideration of risks, benefits and other options for treatment, the patient has consented to  Procedure(s): LEFT HEART CATH AND CORONARY ANGIOGRAPHY (N/A) as a surgical intervention .  The patient's history has been reviewed, patient examined, no change in status, stable for surgery.  I have reviewed the patient's chart and labs.  Questions were answered to the patient's satisfaction.    Cath Lab Visit (complete for each Cath Lab visit)  Clinical Evaluation Leading to the Procedure:   ACS: No.  Non-ACS:    Anginal Classification: CCS III  Anti-ischemic medical therapy: Minimal Therapy (1 class of medications)  Non-Invasive Test Results: No non-invasive testing performed  Prior CABG: No previous CABG         Verne Carrow

## 2018-02-25 NOTE — Progress Notes (Signed)
Pt very upset because of heart healthy diet, TR band still in place taking off the band and stared bleeding telling the nurses to get out of the room. After education pt allowed RN to secure TR band and placed 9cc back in band .

## 2018-02-25 NOTE — Progress Notes (Signed)
Progress Note  Patient Name: Brian [name] Date of Encounter: 02/25/2018  Primary Cardiologist: Nanetta Batty, MD   Subjective   Denies chest pain, no dyspnea lying flat.  Otherwise not very communicative.  Tells me he has "not made his mind up" whether he wants to have a heart catheterization or not. 13 beat run of nonsustained VT early this morning asymptomatic.  Inpatient Medications    Scheduled Meds: . aspirin EC  81 mg Oral Daily  . atorvastatin  80 mg Oral q1800  . carvedilol  6.25 mg Oral BID WC  . doxycycline  100 mg Oral Q12H  . folic acid  1 mg Oral Daily  . furosemide  40 mg Intravenous Q12H  . heparin injection (subcutaneous)  5,000 Units Subcutaneous Q8H  . ipratropium  0.5 mg Inhalation TID  . levalbuterol  1.25 mg Nebulization TID  . lisinopril  5 mg Oral Daily  . LORazepam  0-4 mg Intravenous Q6H   Followed by  . [START ON 02/26/2018] LORazepam  0-4 mg Intravenous Q12H  . methylPREDNISolone (SOLU-MEDROL) injection  40 mg Intravenous Q12H  . mometasone-formoterol  2 puff Inhalation BID  . multivitamin with minerals  1 tablet Oral Daily  . nicotine  21 mg Transdermal Daily  . sodium chloride flush  3 mL Intravenous Q12H  . thiamine  100 mg Oral Daily   Or  . thiamine  100 mg Intravenous Daily   Continuous Infusions: . sodium chloride    . sodium chloride 10 mL/hr at 02/25/18 0624   PRN Meds: sodium chloride, acetaminophen, dextromethorphan-guaiFENesin, hydrALAZINE, LORazepam **OR** LORazepam, morphine injection, nitroGLYCERIN, ondansetron (ZOFRAN) IV, oxyCODONE-acetaminophen, sodium chloride flush, zolpidem   Vital Signs    Vitals:   02/24/18 1700 02/24/18 1940 02/25/18 0014 02/25/18 0340  BP: (!) 151/96 (!) 148/83 140/87 (!) 156/105  Pulse: 81 84 (!) 58 77  Resp: 20 20 20 20   Temp: 98.1 F (36.7 C) 98.4 F (36.9 C) 98.6 F (37 C) 98 F (36.7 C)  TempSrc: Oral Oral Oral Oral  SpO2: 92% 96% 95% 93%  Weight:      Height:         Intake/Output Summary (Last 24 hours) at 02/25/2018 0905 Last data filed at 02/25/2018 0700 Gross per 24 hour  Intake 1680 ml  Output 3375 ml  Net -1695 ml   Filed Weights   02/23/18 2039 02/24/18 0331  Weight: 87.1 kg 86.9 kg    Telemetry    Sinus rhythm, nonsustained VT around 4 AM (13 beats)- Personally Reviewed  ECG    Sinus rhythm, mildly prolonged QT interval (484 ms), otherwise no repolarization changes- Personally Reviewed  Physical Exam  Poorly kempt, not very communicative, does not make eye contact GEN: No acute distress.   Neck: No JVD Cardiac: RRR, no murmurs, rubs, or gallops.  Respiratory: Clear to auscultation bilaterally, with markedly diminished breath sounds throughout GI: Soft, nontender, non-distended  MS: No edema; No deformity. Neuro:  Nonfocal  Psych: Normal affect   Labs    Chemistry Recent Labs  Lab 02/23/18 2043  NA 138  K 4.0  CL 102  CO2 25  GLUCOSE 76  BUN 5*  CREATININE 0.77  CALCIUM 8.6*  GFRNONAA >60  GFRAA >60  ANIONGAP 11     Hematology Recent Labs  Lab 02/23/18 2043  WBC 6.3  RBC 5.17  HGB 16.5  HCT 53.2*  MCV 102.9*  MCH 31.9  MCHC 31.0  RDW 13.3  PLT 156  Cardiac Enzymes Recent Labs  Lab 02/24/18 0302 02/24/18 0736 02/24/18 1402  TROPONINI 0.04* <0.03 <0.03    Recent Labs  Lab 02/23/18 2053  TROPIPOC 0.04     BNP Recent Labs  Lab 02/24/18 0302  BNP 523.3*     DDimer No results for input(s): DDIMER in the last 168 hours.   Radiology    Dg Chest 2 View  Result Date: 02/23/2018 CLINICAL DATA:  Acute onset of sharp left-sided chest pain, radiating to the left shoulder and left arm. Left hand numbness. Dizziness. EXAM: CHEST - 2 VIEW COMPARISON:  Chest radiograph performed 02/11/2018 FINDINGS: The lungs are well-aerated. Minimal bibasilar opacity likely reflects atelectasis. There is no evidence of pleural effusion or pneumothorax. The heart is mildly enlarged. No acute osseous  abnormalities are seen. IMPRESSION: Minimal bibasilar opacity likely reflects atelectasis. Mild cardiomegaly. Electronically Signed   By: Roanna Raider M.D.   On: 02/23/2018 21:21   Mr Cervical Spine Wo Contrast  Result Date: 02/24/2018 CLINICAL DATA:  Neck pain and left arm pain. Left finger tingling for 2 weeks. EXAM: MRI CERVICAL SPINE WITHOUT CONTRAST TECHNIQUE: Multiplanar, multisequence MR imaging of the cervical spine was performed. No intravenous contrast was administered. COMPARISON:  None. FINDINGS: The examination is markedly motion degraded, particularly on the axial images, which limits assessment of the spinal canal and neural foramina. Alignment: Normal. Vertebrae: There are Modic type 1 degenerative endplate signal changes at C3-4. No acute compression fracture or discitis-osteomyelitis. Cord: Normal caliber and signal. Posterior Fossa, vertebral arteries, paraspinal tissues: Visualized posterior fossa is normal. Vertebral artery flow voids are preserved. No prevertebral effusion. Disc levels: Sagittal imaging includes the atlantoaxial joint to the level of the T3-4 disc space, with axial imaging of the disc spaces from C2-3 to C7-T1. C1-2: Normal. C2-3: Normal. C3-4: Small disc bulge with bilateral uncovertebral hypertrophy. Mild spinal canal stenosis. Moderate bilateral neural foraminal stenosis. Normal facets. C4-5: Small disc bulge with normal facets. No central spinal canal stenosis or neural foraminal stenosis. C5-6: Markedly motion degraded. No high-grade spinal canal stenosis. C6-7: Markedly motion degraded. No high-grade spinal canal stenosis. C7-T1: Normal. Sagittal imaging of the T1-T4 disc levels shows no spinal canal or neural foraminal stenosis. IMPRESSION: 1. Motion degraded examination, decreasing detailed assessment of the spinal canal and neural foramina on the axial images. 2. C3-4 mild spinal canal and moderate bilateral neural foraminal stenosis. 3. Otherwise, mild  midcervical degenerative disc disease without high-grade stenosis, degraded by motion. Electronically Signed   By: Deatra Robinson M.D.   On: 02/24/2018 01:02    Cardiac Studies   Echo September 27, 2017 - Left ventricle: The cavity size was mildly dilated. Wall   thickness was normal. Systolic function was moderately to   severely reduced. The estimated ejection fraction was in the   range of 30% to 35%. Diffuse hypokinesis. Features are consistent   with a pseudonormal left ventricular filling pattern, with   concomitant abnormal relaxation and increased filling pressure   (grade 2 diastolic dysfunction). - Aortic valve: There was no stenosis. - Mitral valve: There was trivial regurgitation. - Left atrium: The atrium was mildly dilated. - Right ventricle: The cavity size was mildly dilated. Systolic   function was normal. - Right atrium: The atrium was mildly to moderately dilated. - Tricuspid valve: Peak RV-RA gradient (S): 40 mm Hg. - Pulmonary arteries: PA peak pressure: 48 mm Hg (S). - Systemic veins: IVC measured 2.5 cm with > 50% respirophasic   variation, suggesting RA pressure  8 mmHg. - Pericardium, extracardiac: A trivial pericardial effusion was   identified.  Impressions:  - Mildly dilated LV with EF 30-35%, diffuse hypokinesis. Mildly   dilated RV with normal systolic function. No significant valvular   abnormalities. Mild pulmonary hypertension.   Patient Profile     62 y.o. male with chronic heart failure due to moderately depressed left ventricular systolic function, known recurrent nonsustained VT, history of OPD, hypertension, polysubstance abuse (including alcohol and cocaine) and chronic noncompliance  Assessment & Plan    1. CHF: LV systolic function has been depressed at least since 2013 and he does not appear to be overtly hypervolemic today, despite noncompliance with medication. 2. Chest pain: he has had recurrent problems with chest pain, at one point  felt to be related to cocaine induced vasospasm.  Despite previous recommendations for cardiac catheterization, he has never waited in the hospital long enough to have the procedure and has left AMA.  He does not have any high risk ECG changes, other than prolonged QT interval, troponin is borderline at 0.04.  I tried to explain the catheterization procedure to him, he did not seem particularly interested.  My suspicion is he will leave AMA again. 3.  Substance abuse: Reports that his last cocaine use was 3 weeks ago.  Uses marijuana daily.  Reports that he has "cut back" with alcohol intake to just 1 beer a day, but he is very restless and I suspect he is having symptoms of withdrawal right now.  This probably also explains his elevated blood pressure.   4. NSVT: Hyperadrenergic state related to withdrawal and some degree of beta-blocker rebound (carvedilol restarted) is likely to cause further episodes of nonsustained VT.     For questions or updates, please contact [facility] Please consult [email or web address] for contact info under        Signed, Thurmon Fair, MD  02/25/2018, 9:05 AM

## 2018-02-25 NOTE — Progress Notes (Addendum)
PROGRESS NOTE  Brian [name]:[id number] DOB: 1955-10-30 DOA: 02/23/2018 PCP: [name], PA-C   LOS: 1 day   Brief Narrative / Interim history: 62 year old male with hypertension, systolic CHF, polysubstance abuse, COPD who was admitted to the hospital on 11/16 with chest pain.  Cardiac enzymes have remained negative.  Cardiology was consulted and given risk factors catheterization was recommended  Subjective: -Slightly agitated last night but better on Ativan, no complaints this morning.  Tells me he has been having intermittent chest pain for a while.  Awaiting cath  Assessment & Plan: Principal Problem:   Chest pain Active Problems:   Homelessness   Cocaine abuse (HCC)   Polysubstance (excluding opioids) dependence, daily use (HCC)   Alcohol dependence (HCC)   Acute on chronic respiratory failure with hypoxia (HCC)   Tobacco dependence   COPD exacerbation (HCC)   Acute on chronic combined systolic and diastolic CHF (congestive heart failure) (HCC)   Left shoulder pain   Neck pain on left side   Atypical chest pain, improved Acute on chronic combined systolic and diastolic congestive heart failure, ejection fraction 30% with grade 2 diastolic dysfunction, class III -Cardiology consulted, awaiting cardiac catheterization -Slightly fluid overloaded on admission and on IV Lasix, fluid status looks a lot better this morning -Continue aspirin, statin, Coreg, lisinopril  Acute exacerbation of mild persistent COPD, acute hypoxic respiratory failure Tobacco use -Continue bronchodilators, steroids and antibiotics -No wheezing this morning, on room air, hypoxia resolved  Polysubstance abuse including alcohol -Tenuous CIWA  Shoulder pain and bilateral finger numbness -MRI of the C-spine showed C3-4 mild spinal canal and moderate bilateral neural foraminal stenosis.  Pain control  Scheduled Meds: . aspirin EC  81 mg Oral Daily  . atorvastatin  80 mg Oral q1800    . carvedilol  6.25 mg Oral BID WC  . doxycycline  100 mg Oral Q12H  . folic acid  1 mg Oral Daily  . furosemide  40 mg Intravenous Q12H  . heparin injection (subcutaneous)  5,000 Units Subcutaneous Q8H  . ipratropium  0.5 mg Inhalation TID  . levalbuterol  1.25 mg Nebulization TID  . lisinopril  5 mg Oral Daily  . LORazepam  0-4 mg Intravenous Q6H   Followed by  . [START ON 02/26/2018] LORazepam  0-4 mg Intravenous Q12H  . methylPREDNISolone (SOLU-MEDROL) injection  40 mg Intravenous Q12H  . mometasone-formoterol  2 puff Inhalation BID  . multivitamin with minerals  1 tablet Oral Daily  . nicotine  21 mg Transdermal Daily  . sodium chloride flush  3 mL Intravenous Q12H  . thiamine  100 mg Oral Daily   Or  . thiamine  100 mg Intravenous Daily   Continuous Infusions: . sodium chloride    . sodium chloride 10 mL/hr at 02/25/18 0624   PRN Meds:.sodium chloride, acetaminophen, dextromethorphan-guaiFENesin, hydrALAZINE, LORazepam **OR** LORazepam, morphine injection, nitroGLYCERIN, ondansetron (ZOFRAN) IV, oxyCODONE-acetaminophen, sodium chloride flush, zolpidem   DVT prophylaxis: heparin Code Status: Full code Family Communication: No family at bedside Disposition Plan: Home when cleared by cardiology  Consultants:   Cardiology  Procedures:   Cath pending   Antimicrobials:  Doxycycline 11/16 >>   Objective: Vitals:   02/24/18 1700 02/24/18 1940 02/25/18 0014 02/25/18 0340  BP: (!) 151/96 (!) 148/83 140/87 (!) 156/105  Pulse: 81 84 (!) 58 77  Resp: 20 20 20 20   Temp: 98.1 F (36.7 C) 98.4 F (36.9 C) 98.6 F (37 C) 98 F (36.7 C)  TempSrc: Oral  Oral Oral Oral  SpO2: 92% 96% 95% 93%  Weight:      Height:        Intake/Output Summary (Last 24 hours) at 02/25/2018 0914 Last data filed at 02/25/2018 0700 Gross per 24 hour  Intake 1680 ml  Output 3375 ml  Net -1695 ml   Filed Weights   02/23/18 2039 02/24/18 0331  Weight: 87.1 kg 86.9 kg     Examination:  Constitutional: NAD ENMT: Mucous membranes are moist.  Respiratory: clear to auscultation bilaterally, no wheezing, no crackles.  Overall distant breath sounds Cardiovascular: Regular rate and rhythm, no murmurs / rubs / gallops.  Abdomen: no tenderness. Bowel sounds positive.  Musculoskeletal: no clubbing / cyanosis.  Skin: no rashes Neurologic: Nonfocal.  Psychiatric: Normal judgment and insight. Alert and oriented x 3. Normal mood.    Data Reviewed: I have independently reviewed following labs and imaging studies   CBC: Recent Labs  Lab 02/23/18 2043  WBC 6.3  HGB 16.5  HCT 53.2*  MCV 102.9*  PLT 156   Basic Metabolic Panel: Recent Labs  Lab 02/23/18 2043  NA 138  K 4.0  CL 102  CO2 25  GLUCOSE 76  BUN 5*  CREATININE 0.77  CALCIUM 8.6*   GFR: Estimated Creatinine Clearance: 103.3 mL/min (by C-G formula based on SCr of 0.77 mg/dL). Liver Function Tests: No results for input(s): AST, ALT, ALKPHOS, BILITOT, PROT, ALBUMIN in the last 168 hours. No results for input(s): LIPASE, AMYLASE in the last 168 hours. No results for input(s): AMMONIA in the last 168 hours. Coagulation Profile: No results for input(s): INR, PROTIME in the last 168 hours. Cardiac Enzymes: Recent Labs  Lab 02/24/18 0302 02/24/18 0736 02/24/18 1402  TROPONINI 0.04* <0.03 <0.03   BNP (last 3 results) No results for input(s): PROBNP in the last 8760 hours. HbA1C: Recent Labs    02/24/18 0302  HGBA1C 5.2   CBG: Recent Labs  Lab 02/24/18 0730 02/24/18 1150 02/24/18 1656 02/24/18 2113 02/25/18 0802  GLUCAP 191* 278* 146* 161* 124*   Lipid Profile: Recent Labs    02/24/18 0302  CHOL 127  HDL 62  LDLCALC 51  TRIG 69  CHOLHDL 2.0   Thyroid Function Tests: No results for input(s): TSH, T4TOTAL, FREET4, T3FREE, THYROIDAB in the last 72 hours. Anemia Panel: No results for input(s): VITAMINB12, FOLATE, FERRITIN, TIBC, IRON, RETICCTPCT in the last 72  hours. Urine analysis:    Component Value Date/Time   COLORURINE YELLOW 01/15/2018 0257   APPEARANCEUR CLEAR 01/15/2018 0257   APPEARANCEUR Clear 10/10/2012 0320   LABSPEC 1.016 01/15/2018 0257   LABSPEC 1.009 10/10/2012 0320   PHURINE 5.0 01/15/2018 0257   GLUCOSEU NEGATIVE 01/15/2018 0257   GLUCOSEU Negative 10/10/2012 0320   HGBUR NEGATIVE 01/15/2018 0257   BILIRUBINUR NEGATIVE 01/15/2018 0257   BILIRUBINUR Negative 10/10/2012 0320   KETONESUR NEGATIVE 01/15/2018 0257   PROTEINUR 100 (A) 01/15/2018 0257   UROBILINOGEN 0.2 08/27/2012 0659   NITRITE NEGATIVE 01/15/2018 0257   LEUKOCYTESUR NEGATIVE 01/15/2018 0257   LEUKOCYTESUR Negative 10/10/2012 0320   Sepsis Labs: Invalid input(s): PROCALCITONIN, LACTICIDVEN  No results found for this or any previous visit (from the past 240 hour(s)).    Radiology Studies: Dg Chest 2 View  Result Date: 02/23/2018 CLINICAL DATA:  Acute onset of sharp left-sided chest pain, radiating to the left shoulder and left arm. Left hand numbness. Dizziness. EXAM: CHEST - 2 VIEW COMPARISON:  Chest radiograph performed 02/11/2018 FINDINGS: The lungs are well-aerated. Minimal  bibasilar opacity likely reflects atelectasis. There is no evidence of pleural effusion or pneumothorax. The heart is mildly enlarged. No acute osseous abnormalities are seen. IMPRESSION: Minimal bibasilar opacity likely reflects atelectasis. Mild cardiomegaly. Electronically Signed   By: Roanna Raider M.D.   On: 02/23/2018 21:21   Mr Cervical Spine Wo Contrast  Result Date: 02/24/2018 CLINICAL DATA:  Neck pain and left arm pain. Left finger tingling for 2 weeks. EXAM: MRI CERVICAL SPINE WITHOUT CONTRAST TECHNIQUE: Multiplanar, multisequence MR imaging of the cervical spine was performed. No intravenous contrast was administered. COMPARISON:  None. FINDINGS: The examination is markedly motion degraded, particularly on the axial images, which limits assessment of the spinal canal and  neural foramina. Alignment: Normal. Vertebrae: There are Modic type 1 degenerative endplate signal changes at C3-4. No acute compression fracture or discitis-osteomyelitis. Cord: Normal caliber and signal. Posterior Fossa, vertebral arteries, paraspinal tissues: Visualized posterior fossa is normal. Vertebral artery flow voids are preserved. No prevertebral effusion. Disc levels: Sagittal imaging includes the atlantoaxial joint to the level of the T3-4 disc space, with axial imaging of the disc spaces from C2-3 to C7-T1. C1-2: Normal. C2-3: Normal. C3-4: Small disc bulge with bilateral uncovertebral hypertrophy. Mild spinal canal stenosis. Moderate bilateral neural foraminal stenosis. Normal facets. C4-5: Small disc bulge with normal facets. No central spinal canal stenosis or neural foraminal stenosis. C5-6: Markedly motion degraded. No high-grade spinal canal stenosis. C6-7: Markedly motion degraded. No high-grade spinal canal stenosis. C7-T1: Normal. Sagittal imaging of the T1-T4 disc levels shows no spinal canal or neural foraminal stenosis. IMPRESSION: 1. Motion degraded examination, decreasing detailed assessment of the spinal canal and neural foramina on the axial images. 2. C3-4 mild spinal canal and moderate bilateral neural foraminal stenosis. 3. Otherwise, mild midcervical degenerative disc disease without high-grade stenosis, degraded by motion. Electronically Signed   By: Deatra Robinson M.D.   On: 02/24/2018 01:02   Pamella Pert, MD, PhD [facility] Pager [phone]  If 7PM-7AM, please contact night-coverage [email or web address] Password [facility] 02/25/2018, 9:14 AM

## 2018-02-25 NOTE — Progress Notes (Signed)
Pt refused his vital signs taken.

## 2018-02-26 ENCOUNTER — Encounter: Payer: Self-pay | Admitting: Physician Assistant

## 2018-02-26 ENCOUNTER — Encounter: Payer: Self-pay | Admitting: Interventional Cardiology

## 2018-02-26 ENCOUNTER — Encounter (HOSPITAL_COMMUNITY): Payer: Self-pay | Admitting: Cardiovascular Disease

## 2018-02-26 DIAGNOSIS — R072 Precordial pain: Secondary | ICD-10-CM

## 2018-02-26 DIAGNOSIS — I10 Essential (primary) hypertension: Secondary | ICD-10-CM

## 2018-02-26 LAB — CBC
HCT: 58.7 % — ABNORMAL HIGH (ref 39.0–52.0)
HEMOGLOBIN: 18.1 g/dL — AB (ref 13.0–17.0)
MCH: 30.8 pg (ref 26.0–34.0)
MCHC: 30.8 g/dL (ref 30.0–36.0)
MCV: 100 fL (ref 80.0–100.0)
PLATELETS: 179 10*3/uL (ref 150–400)
RBC: 5.87 MIL/uL — AB (ref 4.22–5.81)
RDW: 12.9 % (ref 11.5–15.5)
WBC: 10.3 10*3/uL (ref 4.0–10.5)
nRBC: 0 % (ref 0.0–0.2)

## 2018-02-26 LAB — BASIC METABOLIC PANEL
Anion gap: 9 (ref 5–15)
BUN: 29 mg/dL — AB (ref 6–20)
CHLORIDE: 102 mmol/L (ref 98–111)
CO2: 31 mmol/L (ref 22–32)
CREATININE: 0.9 mg/dL (ref 0.61–1.24)
Calcium: 9.7 mg/dL (ref 8.9–10.3)
GFR calc Af Amer: >60 mL/min (ref >60)
Glucose, Bld: 94 mg/dL (ref 70–99)
POTASSIUM: 3.9 mmol/L (ref 3.5–5.1)
SODIUM: 142 mmol/L (ref 135–145)

## 2018-02-26 MED ORDER — ATORVASTATIN CALCIUM 80 MG PO TABS: 80.0000 mg | ORAL_TABLET | Freq: Every day | ORAL | 0 refills | Status: AC

## 2018-02-26 MED ORDER — FUROSEMIDE 40 MG PO TABS: 40.0000 mg | ORAL_TABLET | Freq: Every day | ORAL | 11 refills | Status: AC

## 2018-02-26 MED ORDER — DICLOFENAC SODIUM 1 % TD GEL: 4.0000 g | Freq: Four times a day (QID) | TRANSDERMAL | 1 refills | Status: AC

## 2018-02-26 MED ORDER — PREDNISONE 20 MG PO TABS: 40.0000 mg | ORAL_TABLET | Freq: Every day | ORAL | 0 refills | Status: AC

## 2018-02-26 MED ORDER — ALBUTEROL SULFATE HFA 108 (90 BASE) MCG/ACT IN AERS: 2.0000 | INHALATION_SPRAY | Freq: Four times a day (QID) | RESPIRATORY_TRACT | 2 refills | Status: AC | PRN

## 2018-02-26 MED ORDER — ASPIRIN 81 MG PO TBEC: 81.0000 mg | DELAYED_RELEASE_TABLET | Freq: Every day | ORAL | 1 refills | Status: AC

## 2018-02-26 MED ORDER — MOMETASONE FURO-FORMOTEROL FUM 200-5 MCG/ACT IN AERO: 2.0000 | INHALATION_SPRAY | Freq: Two times a day (BID) | RESPIRATORY_TRACT | 2 refills | Status: AC

## 2018-02-26 MED ORDER — LISINOPRIL 5 MG PO TABS: 10.0000 mg | ORAL_TABLET | Freq: Every day | ORAL | 0 refills | Status: AC

## 2018-02-26 MED ORDER — DOXYCYCLINE HYCLATE 100 MG PO TABS: 100.0000 mg | ORAL_TABLET | Freq: Two times a day (BID) | ORAL | 0 refills | Status: AC

## 2018-02-26 MED ORDER — LISINOPRIL 10 MG PO TABS: 10.0000 mg | ORAL_TABLET | Freq: Every day | ORAL | Status: DC

## 2018-02-26 NOTE — Discharge Instructions (Signed)
Follow with Clent Demark, PA-C in 5-7 days  Please get a complete blood count and chemistry panel checked by your Primary MD at your next visit, and again as instructed by your Primary MD. Please get your medications reviewed and adjusted by your Primary MD.  Please request your Primary MD to go over all Hospital Tests and Procedure/Radiological results at the follow up, please get all Hospital records sent to your Prim MD by signing hospital release before you go home.  If you had Pneumonia of Lung problems at the Hospital: Please get a 2 view Chest X ray done in 6-8 weeks after hospital discharge or sooner if instructed by your Primary MD.  If you have Congestive Heart Failure: Please call your Cardiologist or Primary MD anytime you have any of the following symptoms:  1) 3 pound weight gain in 24 hours or 5 pounds in 1 week  2) shortness of breath, with or without a dry hacking cough  3) swelling in the hands, feet or stomach  4) if you have to sleep on extra pillows at night in order to breathe  Follow cardiac low salt diet and 1.5 lit/day fluid restriction.  If you have diabetes Accuchecks 4 times/day, Once in AM empty stomach and then before each meal. Log in all results and show them to your primary doctor at your next visit. If any glucose reading is under 80 or above 300 call your primary MD immediately.  If you have Seizure/Convulsions/Epilepsy: Please do not drive, operate heavy machinery, participate in activities at heights or participate in high speed sports until you have seen by Primary MD or a Neurologist and advised to do so again.  If you had Gastrointestinal Bleeding: Please ask your Primary MD to check a complete blood count within one week of discharge or at your next visit. Your endoscopic/colonoscopic biopsies that are pending at the time of discharge, will also need to followed by your Primary MD.  Get Medicines reviewed and adjusted. Please take all your  medications with you for your next visit with your Primary MD  Please request your Primary MD to go over all hospital tests and procedure/radiological results at the follow up, please ask your Primary MD to get all Hospital records sent to his/her office.  If you experience worsening of your admission symptoms, develop shortness of breath, life threatening emergency, suicidal or homicidal thoughts you must seek medical attention immediately by calling 911 or calling your MD immediately  if symptoms less severe.  You must read complete instructions/literature along with all the possible adverse reactions/side effects for all the Medicines you take and that have been prescribed to you. Take any new Medicines after you have completely understood and accpet all the possible adverse reactions/side effects.   Do not drive or operate heavy machinery when taking Pain medications.   Do not take more than prescribed Pain, Sleep and Anxiety Medications  Special Instructions: If you have smoked or chewed Tobacco  in the last 2 yrs please stop smoking, stop any regular Alcohol  and or any Recreational drug use.  Wear Seat belts while driving.  Please note You were cared for by a hospitalist during your hospital stay. If you have any questions about your discharge medications or the care you received while you were in the hospital after you are discharged, you can call the unit and asked to speak with the hospitalist on call if the hospitalist that took care of you is not available.  Once you are discharged, your primary care physician will handle any further medical issues. Please note that NO REFILLS for any discharge medications will be authorized once you are discharged, as it is imperative that you return to your primary care physician (or establish a relationship with a primary care physician if you do not have one) for your aftercare needs so that they can reassess your need for medications and monitor your  lab values.  You can reach the hospitalist office at phone [phone] or fax [phone]   If you do not have a primary care physician, you can call [phone] for a physician referral.  Activity: As tolerated with Full fall precautions use walker/cane & assistance as needed  Diet: heart healthy  Disposition Home

## 2018-02-26 NOTE — Progress Notes (Signed)
Patient ready for discharge. 3 bus passes given.

## 2018-02-26 NOTE — Progress Notes (Signed)
Pt woke up agitated his bed alarm went off, pt verbally abusive to staff, refused bed alarm.

## 2018-02-26 NOTE — Progress Notes (Signed)
Asked by Dr. Royann Shivers to schedule 2 week f/u. Patient has already been discharged so patient's information was sent forward to scheduling team to help arrange and call pt. Dayna Dunn PA-C

## 2018-02-26 NOTE — Discharge Summary (Signed)
Physician Discharge Summary  Brian [name] AGT:[id number] DOB: 1956-01-09 DOA: 02/23/2018  PCP: [name], PA-C  Admit date: 02/23/2018 Discharge date: 02/26/2018  Admitted From: Home Disposition: Home  Recommendations for Outpatient Follow-up:  1. Follow up with PCP in 1-2 weeks 2. Follow-up with cardiology as scheduled  Home Health: None Equipment/Devices: None  Discharge Condition: Stable CODE STATUS: Full code Diet recommendation: Heart healthy  HPI: Per Dr. Clyde Lundborg, Brian [name] is a 62 y.o. male with medical history significant of homeless, hypertension, COPD, polysubstance abuse (tobacco, alcohol, cocaine marijuana), CHF with EF 30%, who presents with chest pain, left shoulder pain, left neck pain, cough, wheezing. Patient states that he started having left chest pain today. The pain is constant, 10 out of 10 severity, sharp, radiating to left arm and shoulder.  Patient further states that he has left shoulder pain, left arm and left neck pain, which is constant, sharp, 10 out of 10 severity.  It is associated with tingling sensation in left fingers.  No weakness, facial droop or slurred speech.  He has cough with greenish colored sputum production, shortness of breath, wheezing.  No fever or chills.  No nausea, vomiting, diarrhea or abdominal pain no symptoms of UTI.  Patient states that he is not taking any medications at home.  He is homeless. ED Course: pt was found to have negative troponin, WBC 6.3, electrolytes renal function okay, no fever, has tachycardia, tachypnea, oxygen satting 91% on room air, chest x-ray showed mild atelectasis.  Patient is placed on telemetry bed for observation.  Hospital Course: Atypical chest pain, improved Acute on chronic combined systolic and diastolic congestive heart failure, ejection fraction 30% with grade 2 diastolic dysfunction, class III -Cardiology consulted and followed patient while hospitalized, underwent a cardiac  catheterization which showed mild nonobstructive coronary artery disease.  He was slightly fluid overloaded on admission responded well to IV Lasix, converted to p.o.  Continue aspirin, statin, Coreg, lisinopril.  Will have outpatient follow-up with cardiology.  Acute exacerbation of mild persistent COPD, acute hypoxic respiratory failure Tobacco use -Continue bronchodilators, steroids dnd antibiotics, respiratory status improved, no longer wheezing, will be transitioned to p.o. prednisone for a quick taper, 3 more days of doxycycline and his inhalers were refilled. Polysubstance abuse including alcohol Shoulder pain and bilateral finger numbness -MRI of the C-spine showed C3-4 mild spinal canal and moderate bilateral neural foraminal stenosis. Pain control.    Discharge Diagnoses:  Principal Problem:   Chest pain Active Problems:   Homelessness   Cocaine abuse (HCC)   Polysubstance (excluding opioids) dependence, daily use (HCC)   Alcohol dependence (HCC)   Acute on chronic respiratory failure with hypoxia (HCC)   Tobacco dependence   COPD exacerbation (HCC)   Acute on chronic combined systolic and diastolic CHF (congestive heart failure) (HCC)   Left shoulder pain   Neck pain on left side   Precordial chest pain     Discharge Instructions   Allergies as of 02/26/2018   No Known Allergies     Medication List    STOP taking these medications   azithromycin 250 MG tablet Commonly known as:  ZITHROMAX     TAKE these medications   albuterol 108 (90 Base) MCG/ACT inhaler Commonly known as:  PROVENTIL HFA;VENTOLIN HFA Inhale 2 puffs into the lungs every 6 (six) hours as needed for wheezing or shortness of breath.   aspirin 81 MG EC tablet Take 1 tablet (81 mg total) by mouth daily. Start taking  on:  02/27/2018   atorvastatin 80 MG tablet Commonly known as:  LIPITOR Take 1 tablet (80 mg total) by mouth daily at 6 PM.   carvedilol 6.25 MG tablet Commonly known as:   COREG Take 1 tablet (6.25 mg total) by mouth 2 (two) times daily with a meal.   diclofenac sodium 1 % Gel Commonly known as:  VOLTAREN Apply 4 g topically 4 (four) times daily.   doxycycline 100 MG tablet Commonly known as:  VIBRA-TABS Take 1 tablet (100 mg total) by mouth 2 (two) times daily.   folic acid 1 MG tablet Commonly known as:  FOLVITE Take 1 tablet (1 mg total) by mouth daily.   furosemide 40 MG tablet Commonly known as:  LASIX Take 1 tablet (40 mg total) by mouth daily.   lisinopril 5 MG tablet Commonly known as:  PRINIVIL,ZESTRIL Take 2 tablets (10 mg total) by mouth daily. What changed:  how much to take   mometasone-formoterol 200-5 MCG/ACT Aero Commonly known as:  DULERA Inhale 2 puffs into the lungs 2 (two) times daily.   nicotine 21 mg/24hr patch Commonly known as:  NICODERM CQ - dosed in mg/24 hours Place 1 patch (21 mg total) onto the skin daily.   predniSONE 20 MG tablet Commonly known as:  DELTASONE Take 2 tablets (40 mg total) by mouth daily.      Follow-up Information    [name], PA-C. Schedule an appointment as soon as possible for a visit in 1 week(s).   Specialty:  Physician Assistant Contact information: [address] [place] [address] [phone]        Runell Gess, MD .   Specialties:  Cardiology, Radiology Contact information: [address] [place] [address] [phone]           Consultations:  Cardiology   Procedures/Studies:  Cardiac cath    Prox RCA lesion is 40% stenosed.  Mid RCA to Dist RCA lesion is 30% stenosed.  Prox LAD to Mid LAD lesion is 20% stenosed.  Ost 1st Diag lesion is 20% stenosed.   1. Mild non-obstructive CAD  Dg Chest 2 View  Result Date: 02/23/2018 CLINICAL DATA:  Acute onset of sharp left-sided chest pain, radiating to the left shoulder and left arm. Left hand numbness. Dizziness. EXAM: CHEST - 2 VIEW COMPARISON:  Chest radiograph  performed 02/11/2018 FINDINGS: The lungs are well-aerated. Minimal bibasilar opacity likely reflects atelectasis. There is no evidence of pleural effusion or pneumothorax. The heart is mildly enlarged. No acute osseous abnormalities are seen. IMPRESSION: Minimal bibasilar opacity likely reflects atelectasis. Mild cardiomegaly. Electronically Signed   By: Roanna Raider M.D.   On: 02/23/2018 21:21   Dg Chest 2 View  Result Date: 02/11/2018 CLINICAL DATA:  Chest pain EXAM: CHEST - 2 VIEW COMPARISON:  01/14/2018, 01/05/2018 FINDINGS: No focal opacity or pleural effusion. Hyperinflation. Stable cardiomediastinal silhouette with aortic atherosclerosis. No pneumothorax. IMPRESSION: No active cardiopulmonary disease. Electronically Signed   By: Jasmine Pang M.D.   On: 02/11/2018 17:56   Mr Cervical Spine Wo Contrast  Result Date: 02/24/2018 CLINICAL DATA:  Neck pain and left arm pain. Left finger tingling for 2 weeks. EXAM: MRI CERVICAL SPINE WITHOUT CONTRAST TECHNIQUE: Multiplanar, multisequence MR imaging of the cervical spine was performed. No intravenous contrast was administered. COMPARISON:  None. FINDINGS: The examination is markedly motion degraded, particularly on the axial images, which limits assessment of the spinal canal and neural foramina. Alignment: Normal. Vertebrae: There are  Modic type 1 degenerative endplate signal changes at C3-4. No acute compression fracture or discitis-osteomyelitis. Cord: Normal caliber and signal. Posterior Fossa, vertebral arteries, paraspinal tissues: Visualized posterior fossa is normal. Vertebral artery flow voids are preserved. No prevertebral effusion. Disc levels: Sagittal imaging includes the atlantoaxial joint to the level of the T3-4 disc space, with axial imaging of the disc spaces from C2-3 to C7-T1. C1-2: Normal. C2-3: Normal. C3-4: Small disc bulge with bilateral uncovertebral hypertrophy. Mild spinal canal stenosis. Moderate bilateral neural foraminal  stenosis. Normal facets. C4-5: Small disc bulge with normal facets. No central spinal canal stenosis or neural foraminal stenosis. C5-6: Markedly motion degraded. No high-grade spinal canal stenosis. C6-7: Markedly motion degraded. No high-grade spinal canal stenosis. C7-T1: Normal. Sagittal imaging of the T1-T4 disc levels shows no spinal canal or neural foraminal stenosis. IMPRESSION: 1. Motion degraded examination, decreasing detailed assessment of the spinal canal and neural foramina on the axial images. 2. C3-4 mild spinal canal and moderate bilateral neural foraminal stenosis. 3. Otherwise, mild midcervical degenerative disc disease without high-grade stenosis, degraded by motion. Electronically Signed   By: Deatra Robinson M.D.   On: 02/24/2018 01:02    Subjective: - no chest pain, shortness of breath, no abdominal pain, nausea or vomiting.   Discharge Exam: Vitals:   02/26/18 0838 02/26/18 0944  BP:  (!) 114/91  Pulse: 91 98  Resp: 20 19  Temp:  (!) 97.5 F (36.4 C)  SpO2: 97% 96%    General: Pt is alert, awake, not in acute distress Cardiovascular: RRR, S1/S2 +, no rubs, no gallops Respiratory: CTA bilaterally, no wheezing, no rhonchi Abdominal: Soft, NT, ND, bowel sounds + Extremities: no edema, no cyanosis    The results of significant diagnostics from this hospitalization (including imaging, microbiology, ancillary and laboratory) are listed below for reference.     Microbiology: No results found for this or any previous visit (from the past 240 hour(s)).   Labs: BNP (last 3 results) Recent Labs    01/05/18 1806 01/10/18 1448 02/24/18 0302  BNP 457.9* 112.7* 523.3*   Basic Metabolic Panel: Recent Labs  Lab 02/23/18 2043 02/26/18 0423  NA 138 142  K 4.0 3.9  CL 102 102  CO2 25 31  GLUCOSE 76 94  BUN 5* 29*  CREATININE 0.77 0.90  CALCIUM 8.6* 9.7   Liver Function Tests: No results for input(s): AST, ALT, ALKPHOS, BILITOT, PROT, ALBUMIN in the last 168  hours. No results for input(s): LIPASE, AMYLASE in the last 168 hours. No results for input(s): AMMONIA in the last 168 hours. CBC: Recent Labs  Lab 02/23/18 2043 02/26/18 0423  WBC 6.3 10.3  HGB 16.5 18.1*  HCT 53.2* 58.7*  MCV 102.9* 100.0  PLT 156 179   Cardiac Enzymes: Recent Labs  Lab 02/24/18 0302 02/24/18 0736 02/24/18 1402  TROPONINI 0.04* <0.03 <0.03   BNP: Invalid input(s): POCBNP CBG: Recent Labs  Lab 02/24/18 1656 02/24/18 2113 02/25/18 0802 02/25/18 1149 02/25/18 2123  GLUCAP 146* 161* 124* 132* 119*   D-Dimer No results for input(s): DDIMER in the last 72 hours. Hgb A1c Recent Labs    02/24/18 0302  HGBA1C 5.2   Lipid Profile Recent Labs    02/24/18 0302  CHOL 127  HDL 62  LDLCALC 51  TRIG 69  CHOLHDL 2.0   Thyroid function studies No results for input(s): TSH, T4TOTAL, T3FREE, THYROIDAB in the last 72 hours.  Invalid input(s): FREET3 Anemia work up No results for input(s): VITAMINB12, FOLATE,  FERRITIN, TIBC, IRON, RETICCTPCT in the last 72 hours. Urinalysis    Component Value Date/Time   COLORURINE YELLOW 01/15/2018 0257   APPEARANCEUR CLEAR 01/15/2018 0257   APPEARANCEUR Clear 10/10/2012 0320   LABSPEC 1.016 01/15/2018 0257   LABSPEC 1.009 10/10/2012 0320   PHURINE 5.0 01/15/2018 0257   GLUCOSEU NEGATIVE 01/15/2018 0257   GLUCOSEU Negative 10/10/2012 0320   HGBUR NEGATIVE 01/15/2018 0257   BILIRUBINUR NEGATIVE 01/15/2018 0257   BILIRUBINUR Negative 10/10/2012 0320   KETONESUR NEGATIVE 01/15/2018 0257   PROTEINUR 100 (A) 01/15/2018 0257   UROBILINOGEN 0.2 08/27/2012 0659   NITRITE NEGATIVE 01/15/2018 0257   LEUKOCYTESUR NEGATIVE 01/15/2018 0257   LEUKOCYTESUR Negative 10/10/2012 0320   Sepsis Labs Invalid input(s): PROCALCITONIN,  WBC,  LACTICIDVEN   Time coordinating discharge: 35 minutes  SIGNED:  Pamella Pert, MD  [facility] 02/26/2018, 11:05 AM Pager [phone]  If 7PM-7AM, please contact  night-coverage [email or web address] Password [email or web address]

## 2018-02-26 NOTE — Progress Notes (Signed)
Pt discharge instructions reviewed with pt. Pt verbalizes understanding and states he has no questions. Pt belongings with pt. Pt is not in distress. 3 bus passes given to pt per SW. Pt refuses wheelchair.

## 2018-02-26 NOTE — Clinical Social Work Note (Signed)
Clinical Social Work Assessment  Patient Details  Name: DUNG SALINGER MRN: [id number] Date of Birth: 06-Jan-1956  Date of referral:  02/26/18               Reason for consult:  Housing Concerns/Homelessness                Permission sought to share information with:    Permission granted to share information::  No  Name::        Agency::     Relationship::     Contact Information:     Housing/Transportation Living arrangements for the past 2 months:  Homeless, [place] of Information:  Patient, Medical Team Patient Interpreter Needed:  None Criminal Activity/Legal Involvement Pertinent to Current Situation/Hospitalization:  No - Comment as needed Significant Relationships:  Other Family Members, Siblings Lives with:  Relatives Do you feel safe going back to the place where you live?  Yes Need for family participation in patient care:  Yes (Comment)  Care giving concerns:  Homelessness.   Social Worker assessment / plan:  CSW met with patient. No supports at bedside. CSW introduced role and inquired about resource needs. Patient declined emergency assistance resources but is agreeable to bus passes. CSW provided 3 to get home and post-discharge doctor appts. CSW encouraged patient to go to DSS and sign up for [employer]. Patient asked CSW what information he should give to the people working on his disability application. CSW explained that I do not manage any disability cases so I could not provide advice on that. Patient plans to return to his niece's home today. No further concerns. CSW signing off as social work intervention is no longer needed.  Employment status:  Unemployed Forensic scientist:  Medicaid In [place] PT Recommendations:  Not assessed at this time Information / Referral to community resources:  Shelter, Other (Comment Required)(Emergency assistance resources, food resources, DSS ([employer]), Bus  passes.)  Patient/Family's Response to care:  Patient declined resources but agreeable to bus passes. Patient's niece and brother supportive and involved in patient's care. Patient appreciated social work intervention.  Patient/Family's Understanding of and Emotional Response to Diagnosis, Current Treatment, and Prognosis:  Patient has a good understanding of the reason for admission and social work consult. Patient appears happy with hospital care.  Emotional Assessment Appearance:  Appears stated age Attitude/Demeanor/Rapport:  Engaged, Gracious Affect (typically observed):  Accepting, Appropriate, Calm, Pleasant Orientation:  Oriented to Self, Oriented to Place, Oriented to  Time, Oriented to Situation Alcohol / Substance use:  Tobacco Use, Alcohol Use, Illicit Drugs Psych involvement (Current and /or in the community):  No (Comment)  Discharge Needs  Concerns to be addressed:  No discharge needs identified Readmission within the last 30 days:  No Current discharge risk:  Substance Abuse Barriers to Discharge:  No Barriers Identified   Candie Chroman, LCSW 02/26/2018, 11:48 AM

## 2018-02-26 NOTE — Progress Notes (Signed)
Progress Note  Patient Name: Brian [name] Date of Encounter: 02/26/2018  Primary Cardiologist: Nanetta Batty, MD   Subjective   A little agitated, pacing. Wants a bus token and wants to make sure records are sent to his disability application. No problems at cath site. No dyspnea walking in room. Cath with minor nonobstructive CAD. LVEDP 17 mm Hg.  Inpatient Medications    Scheduled Meds: . aspirin EC  81 mg Oral Daily  . atorvastatin  80 mg Oral q1800  . carvedilol  6.25 mg Oral BID WC  . folic acid  1 mg Oral Daily  . furosemide  40 mg Intravenous Q12H  . heparin injection (subcutaneous)  5,000 Units Subcutaneous Q8H  . lisinopril  5 mg Oral Daily  . LORazepam  0-4 mg Intravenous Q12H  . mometasone-formoterol  2 puff Inhalation BID  . multivitamin with minerals  1 tablet Oral Daily  . nicotine  21 mg Transdermal Daily  . predniSONE  40 mg Oral Q breakfast  . sodium chloride flush  3 mL Intravenous Q12H  . thiamine  100 mg Oral Daily   Continuous Infusions: . sodium chloride     PRN Meds: sodium chloride, acetaminophen, dextromethorphan-guaiFENesin, hydrALAZINE, levalbuterol, LORazepam **OR** LORazepam, morphine injection, nitroGLYCERIN, ondansetron (ZOFRAN) IV, oxyCODONE-acetaminophen, sodium chloride flush, zolpidem   Vital Signs    Vitals:   02/26/18 0014 02/26/18 0405 02/26/18 0838 02/26/18 0944  BP: 128/81 128/89  (!) 114/91  Pulse: 81 93 91 98  Resp: 20 20 20 19   Temp: 98.2 F (36.8 C) 98 F (36.7 C)  (!) 97.5 F (36.4 C)  TempSrc: Oral Oral  Oral  SpO2: 97% 94% 97% 96%  Weight:  84.9 kg    Height:        Intake/Output Summary (Last 24 hours) at 02/26/2018 1031 Last data filed at 02/26/2018 0949 Gross per 24 hour  Intake 1139.76 ml  Output 3600 ml  Net -2460.24 ml   Filed Weights   02/23/18 2039 02/24/18 0331 02/26/18 0405  Weight: 87.1 kg 86.9 kg 84.9 kg    Telemetry    Off monitor - Personally Reviewed  ECG    No new tracing  - Personally Reviewed  Physical Exam  Agitated, pacing in the room GEN: No acute distress.   Neck: No JVD Cardiac: RRR, no murmurs, rubs, or gallops.  Respiratory: Clear to auscultation bilaterally. GI: Soft, nontender, non-distended  MS: No edema; No deformity. Neuro:  Nonfocal  Psych: Normal affect   Labs    Chemistry Recent Labs  Lab 02/23/18 2043 02/26/18 0423  NA 138 142  K 4.0 3.9  CL 102 102  CO2 25 31  GLUCOSE 76 94  BUN 5* 29*  CREATININE 0.77 0.90  CALCIUM 8.6* 9.7  GFRNONAA >60 >60  GFRAA >60 >60  ANIONGAP 11 9     Hematology Recent Labs  Lab 02/23/18 2043 02/26/18 0423  WBC 6.3 10.3  RBC 5.17 5.87*  HGB 16.5 18.1*  HCT 53.2* 58.7*  MCV 102.9* 100.0  MCH 31.9 30.8  MCHC 31.0 30.8  RDW 13.3 12.9  PLT 156 179    Cardiac Enzymes Recent Labs  Lab 02/24/18 0302 02/24/18 0736 02/24/18 1402  TROPONINI 0.04* <0.03 <0.03    Recent Labs  Lab 02/23/18 2053  TROPIPOC 0.04     BNP Recent Labs  Lab 02/24/18 0302  BNP 523.3*     DDimer No results for input(s): DDIMER in the last 168 hours.   Radiology  No results found.  Cardiac Studies   CATH 02/25/2018  Prox RCA lesion is 40% stenosed.  Mid RCA to Dist RCA lesion is 30% stenosed.  Prox LAD to Mid LAD lesion is 20% stenosed.  Ost 1st Diag lesion is 20% stenosed.   1. Mild non-obstructive CAD  Recommendations: Medical management of CAD.   Echo September 27, 2017 - Left ventricle: The cavity size was mildly dilated. Wall thickness was normal. Systolic function was moderately to severely reduced. The estimated ejection fraction was in the range of 30% to 35%. Diffuse hypokinesis. Features are consistent with a pseudonormal left ventricular filling pattern, with concomitant abnormal relaxation and increased filling pressure (grade 2 diastolic dysfunction). - Aortic valve: There was no stenosis. - Mitral valve: There was trivial regurgitation. - Left atrium: The  atrium was mildly dilated. - Right ventricle: The cavity size was mildly dilated. Systolic function was normal. - Right atrium: The atrium was mildly to moderately dilated. - Tricuspid valve: Peak RV-RA gradient (S): 40 mm Hg. - Pulmonary arteries: PA peak pressure: 48 mm Hg (S). - Systemic veins: IVC measured 2.5 cm with > 50% respirophasic variation, suggesting RA pressure 8 mmHg. - Pericardium, extracardiac: A trivial pericardial effusion was identified.  Impressions:  - Mildly dilated LV with EF 30-35%, diffuse hypokinesis. Mildly dilated RV with normal systolic function. No significant valvular abnormalities. Mild pulmonary hypertension.   Patient Profile     62 y.o. male with chronic heart failure due to moderately depressed left ventricular systolic function, known recurrent nonsustained VT, history of COPD, hypertension, polysubstance abuse (including alcohol and cocaine) and chronic noncompliance  Assessment & Plan    1. CHF: LV systolic function has been depressed at least since 2013 and he appears clinically euvolemic. LVEDP minimally elevated at cath yesterday. Increase ACEi. When using beta blocker, always use carvedilol to add alpha blockade. Reinforced the need to avoid cocaine use to avoid drug side effects. 2.  Substance abuse: Most likely reason for his cardiomyopathy. Reports that his last cocaine use was 3 weeks ago.  Reports that he has "cut back" with alcohol intake to just 1 beer a day, but seems to have some manifestations of ETOH withdrawal, making me doubt this report.   3. NSVT: off monitor. On beta blocker; prefer carvedilol.     [facility] will sign off.   Medication Recommendations:  Lisinopril 10 mg daily, furosemide 40 mg daily, carvedilol 6.25 mg twice daily, Atorvastatin 80 mg daily, aspirin 81 mg daily.  Other recommendations (labs, testing, etc):  BMET in 2 weeks Follow up as an outpatient:  We will schedule visit in 2 weeks.  For  questions or updates, please contact [facility] Please consult [email or web address] for contact info under        Signed, Thurmon Fair, MD  02/26/2018, 10:31 AM

## 2018-03-08 ENCOUNTER — Other Ambulatory Visit: Payer: Self-pay

## 2018-03-08 ENCOUNTER — Inpatient Hospital Stay (HOSPITAL_COMMUNITY)
Admission: AD | Admit: 2018-03-08 | Discharge: 2018-04-10 | DRG: 004 | Disposition: E | Payer: Medicaid Other | Source: Other Acute Inpatient Hospital | Attending: Internal Medicine | Admitting: Internal Medicine

## 2018-03-08 ENCOUNTER — Inpatient Hospital Stay (HOSPITAL_COMMUNITY): Payer: Medicaid Other

## 2018-03-08 ENCOUNTER — Emergency Department
Admission: EM | Admit: 2018-03-08 | Discharge: 2018-03-08 | Disposition: A | Discharge status: Other Acute Inpatient Hospital | Payer: Medicaid Other | Attending: Emergency Medicine | Admitting: Emergency Medicine

## 2018-03-08 ENCOUNTER — Encounter: Payer: Self-pay | Admitting: Emergency Medicine

## 2018-03-08 ENCOUNTER — Emergency Department: Payer: Medicaid Other

## 2018-03-08 DIAGNOSIS — Z8249 Family history of ischemic heart disease and other diseases of the circulatory system: Secondary | ICD-10-CM | POA: Diagnosis not present

## 2018-03-08 DIAGNOSIS — Z79899 Other long term (current) drug therapy: Secondary | ICD-10-CM

## 2018-03-08 DIAGNOSIS — R1310 Dysphagia, unspecified: Secondary | ICD-10-CM | POA: Diagnosis not present

## 2018-03-08 DIAGNOSIS — I739 Peripheral vascular disease, unspecified: Secondary | ICD-10-CM | POA: Diagnosis present

## 2018-03-08 DIAGNOSIS — J440 Chronic obstructive pulmonary disease with acute lower respiratory infection: Secondary | ICD-10-CM | POA: Diagnosis present

## 2018-03-08 DIAGNOSIS — Z590 Homelessness: Secondary | ICD-10-CM | POA: Diagnosis not present

## 2018-03-08 DIAGNOSIS — I10 Essential (primary) hypertension: Secondary | ICD-10-CM | POA: Diagnosis not present

## 2018-03-08 DIAGNOSIS — Z66 Do not resuscitate: Secondary | ICD-10-CM | POA: Diagnosis not present

## 2018-03-08 DIAGNOSIS — J9602 Acute respiratory failure with hypercapnia: Secondary | ICD-10-CM | POA: Diagnosis not present

## 2018-03-08 DIAGNOSIS — R579 Shock, unspecified: Secondary | ICD-10-CM | POA: Diagnosis not present

## 2018-03-08 DIAGNOSIS — R0603 Acute respiratory distress: Secondary | ICD-10-CM

## 2018-03-08 DIAGNOSIS — Z930 Tracheostomy status: Secondary | ICD-10-CM | POA: Diagnosis not present

## 2018-03-08 DIAGNOSIS — I509 Heart failure, unspecified: Secondary | ICD-10-CM | POA: Diagnosis not present

## 2018-03-08 DIAGNOSIS — K279 Peptic ulcer, site unspecified, unspecified as acute or chronic, without hemorrhage or perforation: Secondary | ICD-10-CM | POA: Diagnosis present

## 2018-03-08 DIAGNOSIS — J449 Chronic obstructive pulmonary disease, unspecified: Secondary | ICD-10-CM | POA: Insufficient documentation

## 2018-03-08 DIAGNOSIS — J189 Pneumonia, unspecified organism: Secondary | ICD-10-CM

## 2018-03-08 DIAGNOSIS — F17210 Nicotine dependence, cigarettes, uncomplicated: Secondary | ICD-10-CM | POA: Insufficient documentation

## 2018-03-08 DIAGNOSIS — Z515 Encounter for palliative care: Secondary | ICD-10-CM | POA: Diagnosis not present

## 2018-03-08 DIAGNOSIS — Z978 Presence of other specified devices: Secondary | ICD-10-CM

## 2018-03-08 DIAGNOSIS — J9601 Acute respiratory failure with hypoxia: Secondary | ICD-10-CM

## 2018-03-08 DIAGNOSIS — M954 Acquired deformity of chest and rib: Secondary | ICD-10-CM | POA: Diagnosis present

## 2018-03-08 DIAGNOSIS — J14 Pneumonia due to Hemophilus influenzae: Secondary | ICD-10-CM | POA: Diagnosis present

## 2018-03-08 DIAGNOSIS — Z809 Family history of malignant neoplasm, unspecified: Secondary | ICD-10-CM | POA: Diagnosis not present

## 2018-03-08 DIAGNOSIS — R410 Disorientation, unspecified: Secondary | ICD-10-CM | POA: Diagnosis not present

## 2018-03-08 DIAGNOSIS — J95812 Postprocedural air leak: Secondary | ICD-10-CM | POA: Diagnosis not present

## 2018-03-08 DIAGNOSIS — Z9289 Personal history of other medical treatment: Secondary | ICD-10-CM

## 2018-03-08 DIAGNOSIS — I2699 Other pulmonary embolism without acute cor pulmonale: Secondary | ICD-10-CM | POA: Diagnosis present

## 2018-03-08 DIAGNOSIS — I280 Arteriovenous fistula of pulmonary vessels: Secondary | ICD-10-CM | POA: Diagnosis not present

## 2018-03-08 DIAGNOSIS — Z9119 Patient's noncompliance with other medical treatment and regimen: Secondary | ICD-10-CM

## 2018-03-08 DIAGNOSIS — I2510 Atherosclerotic heart disease of native coronary artery without angina pectoris: Secondary | ICD-10-CM | POA: Diagnosis not present

## 2018-03-08 DIAGNOSIS — I5023 Acute on chronic systolic (congestive) heart failure: Secondary | ICD-10-CM | POA: Diagnosis present

## 2018-03-08 DIAGNOSIS — B964 Proteus (mirabilis) (morganii) as the cause of diseases classified elsewhere: Secondary | ICD-10-CM | POA: Diagnosis present

## 2018-03-08 DIAGNOSIS — R627 Adult failure to thrive: Secondary | ICD-10-CM | POA: Diagnosis not present

## 2018-03-08 DIAGNOSIS — R509 Fever, unspecified: Secondary | ICD-10-CM | POA: Diagnosis not present

## 2018-03-08 DIAGNOSIS — F10239 Alcohol dependence with withdrawal, unspecified: Secondary | ICD-10-CM | POA: Diagnosis not present

## 2018-03-08 DIAGNOSIS — E162 Hypoglycemia, unspecified: Secondary | ICD-10-CM | POA: Diagnosis not present

## 2018-03-08 DIAGNOSIS — J810 Acute pulmonary edema: Secondary | ICD-10-CM | POA: Diagnosis not present

## 2018-03-08 DIAGNOSIS — R918 Other nonspecific abnormal finding of lung field: Secondary | ICD-10-CM | POA: Diagnosis present

## 2018-03-08 DIAGNOSIS — F1410 Cocaine abuse, uncomplicated: Secondary | ICD-10-CM | POA: Diagnosis present

## 2018-03-08 DIAGNOSIS — I82411 Acute embolism and thrombosis of right femoral vein: Secondary | ICD-10-CM | POA: Diagnosis present

## 2018-03-08 DIAGNOSIS — J9621 Acute and chronic respiratory failure with hypoxia: Secondary | ICD-10-CM | POA: Diagnosis present

## 2018-03-08 DIAGNOSIS — I82409 Acute embolism and thrombosis of unspecified deep veins of unspecified lower extremity: Secondary | ICD-10-CM

## 2018-03-08 DIAGNOSIS — J441 Chronic obstructive pulmonary disease with (acute) exacerbation: Secondary | ICD-10-CM | POA: Diagnosis present

## 2018-03-08 DIAGNOSIS — J9600 Acute respiratory failure, unspecified whether with hypoxia or hypercapnia: Secondary | ICD-10-CM

## 2018-03-08 DIAGNOSIS — R0902 Hypoxemia: Secondary | ICD-10-CM

## 2018-03-08 DIAGNOSIS — J156 Pneumonia due to other aerobic Gram-negative bacteria: Secondary | ICD-10-CM | POA: Diagnosis present

## 2018-03-08 DIAGNOSIS — Z781 Physical restraint status: Secondary | ICD-10-CM

## 2018-03-08 DIAGNOSIS — Z0189 Encounter for other specified special examinations: Secondary | ICD-10-CM

## 2018-03-08 DIAGNOSIS — Z7189 Other specified counseling: Secondary | ICD-10-CM | POA: Diagnosis not present

## 2018-03-08 DIAGNOSIS — I255 Ischemic cardiomyopathy: Secondary | ICD-10-CM | POA: Diagnosis present

## 2018-03-08 DIAGNOSIS — Z6829 Body mass index (BMI) 29.0-29.9, adult: Secondary | ICD-10-CM

## 2018-03-08 DIAGNOSIS — R339 Retention of urine, unspecified: Secondary | ICD-10-CM | POA: Diagnosis not present

## 2018-03-08 DIAGNOSIS — G9340 Encephalopathy, unspecified: Secondary | ICD-10-CM | POA: Diagnosis not present

## 2018-03-08 DIAGNOSIS — R570 Cardiogenic shock: Secondary | ICD-10-CM | POA: Insufficient documentation

## 2018-03-08 DIAGNOSIS — J9620 Acute and chronic respiratory failure, unspecified whether with hypoxia or hypercapnia: Secondary | ICD-10-CM | POA: Diagnosis not present

## 2018-03-08 DIAGNOSIS — R578 Other shock: Secondary | ICD-10-CM | POA: Diagnosis not present

## 2018-03-08 DIAGNOSIS — J9622 Acute and chronic respiratory failure with hypercapnia: Secondary | ICD-10-CM | POA: Diagnosis present

## 2018-03-08 DIAGNOSIS — Z7982 Long term (current) use of aspirin: Secondary | ICD-10-CM

## 2018-03-08 DIAGNOSIS — R451 Restlessness and agitation: Secondary | ICD-10-CM | POA: Diagnosis not present

## 2018-03-08 DIAGNOSIS — Z9911 Dependence on respirator [ventilator] status: Secondary | ICD-10-CM | POA: Diagnosis not present

## 2018-03-08 DIAGNOSIS — J9690 Respiratory failure, unspecified, unspecified whether with hypoxia or hypercapnia: Secondary | ICD-10-CM | POA: Diagnosis present

## 2018-03-08 DIAGNOSIS — R0602 Shortness of breath: Secondary | ICD-10-CM | POA: Diagnosis not present

## 2018-03-08 DIAGNOSIS — E876 Hypokalemia: Secondary | ICD-10-CM | POA: Diagnosis not present

## 2018-03-08 DIAGNOSIS — R739 Hyperglycemia, unspecified: Secondary | ICD-10-CM | POA: Diagnosis not present

## 2018-03-08 DIAGNOSIS — I5042 Chronic combined systolic (congestive) and diastolic (congestive) heart failure: Secondary | ICD-10-CM | POA: Insufficient documentation

## 2018-03-08 DIAGNOSIS — Z7952 Long term (current) use of systemic steroids: Secondary | ICD-10-CM

## 2018-03-08 DIAGNOSIS — B9789 Other viral agents as the cause of diseases classified elsewhere: Secondary | ICD-10-CM | POA: Diagnosis present

## 2018-03-08 DIAGNOSIS — L8990 Pressure ulcer of unspecified site, unspecified stage: Secondary | ICD-10-CM

## 2018-03-08 DIAGNOSIS — Y848 Other medical procedures as the cause of abnormal reaction of the patient, or of later complication, without mention of misadventure at the time of the procedure: Secondary | ICD-10-CM | POA: Diagnosis not present

## 2018-03-08 DIAGNOSIS — F419 Anxiety disorder, unspecified: Secondary | ICD-10-CM | POA: Diagnosis not present

## 2018-03-08 DIAGNOSIS — R34 Anuria and oliguria: Secondary | ICD-10-CM | POA: Diagnosis not present

## 2018-03-08 DIAGNOSIS — Z4659 Encounter for fitting and adjustment of other gastrointestinal appliance and device: Secondary | ICD-10-CM

## 2018-03-08 DIAGNOSIS — M1990 Unspecified osteoarthritis, unspecified site: Secondary | ICD-10-CM | POA: Diagnosis present

## 2018-03-08 DIAGNOSIS — Z915 Personal history of self-harm: Secondary | ICD-10-CM

## 2018-03-08 DIAGNOSIS — E669 Obesity, unspecified: Secondary | ICD-10-CM | POA: Diagnosis present

## 2018-03-08 LAB — URINE DRUG SCREEN, QUALITATIVE (ARMC ONLY)
Amphetamines, Ur Screen: NOT DETECTED
BARBITURATES, UR SCREEN: NOT DETECTED
BENZODIAZEPINE, UR SCRN: NOT DETECTED
COCAINE METABOLITE, UR ~~LOC~~: NOT DETECTED
Cannabinoid 50 Ng, Ur ~~LOC~~: POSITIVE — AB
MDMA (Ecstasy)Ur Screen: NOT DETECTED
Methadone Scn, Ur: NOT DETECTED
Opiate, Ur Screen: NOT DETECTED
PHENCYCLIDINE (PCP) UR S: NOT DETECTED
Tricyclic, Ur Screen: NOT DETECTED

## 2018-03-08 LAB — RESPIRATORY PANEL BY PCR
Adenovirus: NOT DETECTED
Bordetella pertussis: NOT DETECTED
Chlamydophila pneumoniae: NOT DETECTED
Coronavirus 229E: NOT DETECTED
Coronavirus HKU1: NOT DETECTED
Coronavirus NL63: NOT DETECTED
Coronavirus OC43: NOT DETECTED
INFLUENZA A-RVPPCR: NOT DETECTED
Influenza B: NOT DETECTED
Metapneumovirus: NOT DETECTED
Mycoplasma pneumoniae: NOT DETECTED
PARAINFLUENZA VIRUS 4-RVPPCR: NOT DETECTED
Parainfluenza Virus 1: NOT DETECTED
Parainfluenza Virus 2: NOT DETECTED
Parainfluenza Virus 3: NOT DETECTED
Respiratory Syncytial Virus: NOT DETECTED
Rhinovirus / Enterovirus: DETECTED — AB

## 2018-03-08 LAB — BLOOD GAS, VENOUS
ACID-BASE EXCESS: 8.6 mmol/L — AB (ref 0.0–2.0)
BICARBONATE: 39 mmol/L — AB (ref 20.0–28.0)
FIO2: 1
MECHVT: 550 mL
O2 SAT: 53.9 %
PATIENT TEMPERATURE: 37
PCO2 VEN: 85 mmHg — AB (ref 44.0–60.0)
PEEP: 8 cmH2O
PH VEN: 7.27 (ref 7.250–7.430)
PO2 VEN: 33 mmHg (ref 32.0–45.0)
RATE: 16 resp/min

## 2018-03-08 LAB — CBC WITH DIFFERENTIAL/PLATELET
ABS IMMATURE GRANULOCYTES: 0.07 10*3/uL (ref 0.00–0.07)
Basophils Absolute: 0.1 10*3/uL (ref 0.0–0.1)
Basophils Relative: 1 %
Eosinophils Absolute: 0.1 10*3/uL (ref 0.0–0.5)
Eosinophils Relative: 1 %
HEMATOCRIT: 57.3 % — AB (ref 39.0–52.0)
HEMOGLOBIN: 18 g/dL — AB (ref 13.0–17.0)
Immature Granulocytes: 1 %
LYMPHS PCT: 8 %
Lymphs Abs: 1 10*3/uL (ref 0.7–4.0)
MCH: 31.7 pg (ref 26.0–34.0)
MCHC: 31.4 g/dL (ref 30.0–36.0)
MCV: 101.1 fL — AB (ref 80.0–100.0)
MONO ABS: 0.7 10*3/uL (ref 0.1–1.0)
MONOS PCT: 6 %
NEUTROS ABS: 10.2 10*3/uL — AB (ref 1.7–7.7)
Neutrophils Relative %: 83 %
Platelets: 211 10*3/uL (ref 150–400)
RBC: 5.67 MIL/uL (ref 4.22–5.81)
RDW: 13.1 % (ref 11.5–15.5)
WBC: 12.1 10*3/uL — ABNORMAL HIGH (ref 4.0–10.5)
nRBC: 0 % (ref 0.0–0.2)

## 2018-03-08 LAB — URINALYSIS, COMPLETE (UACMP) WITH MICROSCOPIC
Bilirubin Urine: NEGATIVE
Glucose, UA: NEGATIVE mg/dL
Ketones, ur: NEGATIVE mg/dL
Leukocytes, UA: NEGATIVE
Nitrite: NEGATIVE
PH: 5 (ref 5.0–8.0)
Protein, ur: >=300 mg/dL — AB
SPECIFIC GRAVITY, URINE: 1.023 (ref 1.005–1.030)
Squamous Epithelial / LPF: NONE SEEN (ref 0–5)

## 2018-03-08 LAB — POTASSIUM: Potassium: 4.6 mmol/L (ref 3.5–5.1)

## 2018-03-08 LAB — GLUCOSE, CAPILLARY
GLUCOSE-CAPILLARY: 201 mg/dL — AB (ref 70–99)
Glucose-Capillary: 208 mg/dL — ABNORMAL HIGH (ref 70–99)
Glucose-Capillary: 193 mg/dL — ABNORMAL HIGH (ref 70–99)

## 2018-03-08 LAB — COMPREHENSIVE METABOLIC PANEL
ALBUMIN: 4.3 g/dL (ref 3.5–5.0)
ALK PHOS: 49 U/L (ref 38–126)
ALT: 70 U/L — ABNORMAL HIGH (ref 0–44)
AST: 46 U/L — ABNORMAL HIGH (ref 15–41)
Anion gap: 9 (ref 5–15)
BILIRUBIN TOTAL: 0.7 mg/dL (ref 0.3–1.2)
BUN: 18 mg/dL (ref 6–20)
CALCIUM: 9.4 mg/dL (ref 8.9–10.3)
CO2: 33 mmol/L — AB (ref 22–32)
Chloride: 101 mmol/L (ref 98–111)
Creatinine, Ser: 0.72 mg/dL (ref 0.61–1.24)
GFR calc non Af Amer: >60 mL/min (ref >60)
GLUCOSE: 136 mg/dL — AB (ref 70–99)
Potassium: 4.8 mmol/L (ref 3.5–5.1)
SODIUM: 143 mmol/L (ref 135–145)
Total Protein: 7.7 g/dL (ref 6.5–8.1)

## 2018-03-08 LAB — BLOOD GAS, ARTERIAL
Acid-Base Excess: 6.9 mmol/L — ABNORMAL HIGH (ref 0.0–2.0)
Bicarbonate: 33 mmol/L — ABNORMAL HIGH (ref 20.0–28.0)
FIO2: 0.4
MECHVT: 550 mL
O2 Saturation: 92.3 %
PEEP: 5 cmH2O
Patient temperature: 37
RATE: 20 resp/min
pCO2 arterial: 52 mmHg — ABNORMAL HIGH (ref 32.0–48.0)
pH, Arterial: 7.41 (ref 7.350–7.450)
pO2, Arterial: 64 mmHg — ABNORMAL LOW (ref 83.0–108.0)

## 2018-03-08 LAB — POCT I-STAT 3, VENOUS BLOOD GAS (G3P V)
Acid-Base Excess: 5 mmol/L — ABNORMAL HIGH (ref 0.0–2.0)
Bicarbonate: 35.1 mmol/L — ABNORMAL HIGH (ref 20.0–28.0)
O2 Saturation: 59 %
TCO2: 37 mmol/L — ABNORMAL HIGH (ref 22–32)
pCO2, Ven: 74.4 mmHg (ref 44.0–60.0)
pH, Ven: 7.282 (ref 7.250–7.430)
pO2, Ven: 36 mmHg (ref 32.0–45.0)

## 2018-03-08 LAB — INFLUENZA PANEL BY PCR (TYPE A & B)
INFLAPCR: NEGATIVE
Influenza B By PCR: NEGATIVE

## 2018-03-08 LAB — CK: CK TOTAL: 138 U/L (ref 49–397)

## 2018-03-08 LAB — BRAIN NATRIURETIC PEPTIDE: B Natriuretic Peptide: 823 pg/mL — ABNORMAL HIGH (ref 0.0–100.0)

## 2018-03-08 LAB — LACTIC ACID, PLASMA
LACTIC ACID, VENOUS: 0.9 mmol/L (ref 0.5–1.9)
Lactic Acid, Venous: 1.5 mmol/L (ref 0.5–1.9)

## 2018-03-08 LAB — TROPONIN I
Troponin I: 0.04 ng/mL (ref <0.03)
Troponin I: 0.03 ng/mL (ref <0.03)

## 2018-03-08 LAB — MAGNESIUM: Magnesium: 2.1 mg/dL (ref 1.7–2.4)

## 2018-03-08 LAB — COOXEMETRY PANEL
Carboxyhemoglobin: 5.1 % (ref 0.5–1.5)
Methemoglobin: 1.1 % (ref 0.0–1.5)
O2 Saturation: 83.2 %
Total hemoglobin: 17.2 g/dL — ABNORMAL HIGH (ref 12.0–16.0)

## 2018-03-08 LAB — HEPARIN LEVEL (UNFRACTIONATED): Heparin Unfractionated: 0.15 IU/mL — ABNORMAL LOW (ref 0.30–0.70)

## 2018-03-08 LAB — PHOSPHORUS: Phosphorus: 4.4 mg/dL (ref 2.5–4.6)

## 2018-03-08 LAB — MRSA PCR SCREENING: MRSA by PCR: NEGATIVE

## 2018-03-08 LAB — ECHOCARDIOGRAM COMPLETE

## 2018-03-08 MED ORDER — NOREPINEPHRINE 4 MG/250ML-% IV SOLN
4.0000 ug/min | INTRAVENOUS | Status: DC
  Administered 2018-03-08: 15 ug/min via INTRAVENOUS

## 2018-03-08 MED ORDER — PERFLUTREN LIPID MICROSPHERE
INTRAVENOUS | Status: AC
  Filled 2018-03-08: qty 10

## 2018-03-08 MED ORDER — VITAL HIGH PROTEIN PO LIQD
1000.0000 mL | ORAL | Status: DC
  Administered 2018-03-08 – 2018-03-11 (×4): 1000 mL

## 2018-03-08 MED ORDER — FAMOTIDINE 20 MG PO TABS: 20.0000 mg | ORAL_TABLET | Freq: Two times a day (BID) | ORAL | Status: DC

## 2018-03-08 MED ORDER — SUCCINYLCHOLINE CHLORIDE 20 MG/ML IJ SOLN
INTRAMUSCULAR | Status: AC | PRN
  Administered 2018-03-08: 200 mg via INTRAVENOUS

## 2018-03-08 MED ORDER — FENTANYL 2500MCG IN NS 250ML (10MCG/ML) PREMIX INFUSION
150.0000 ug/h | INTRAVENOUS | Status: DC
  Administered 2018-03-08: 150 ug/h via INTRAVENOUS
  Filled 2018-03-08: qty 250

## 2018-03-08 MED ORDER — NOREPINEPHRINE 4 MG/250ML-% IV SOLN
INTRAVENOUS | Status: AC
  Filled 2018-03-08: qty 250

## 2018-03-08 MED ORDER — ACETAMINOPHEN 325 MG PO TABS
650.0000 mg | ORAL_TABLET | ORAL | Status: DC | PRN
  Administered 2018-03-14 – 2018-03-26 (×7): 650 mg via ORAL
  Filled 2018-03-08 (×7): qty 2

## 2018-03-08 MED ORDER — ATORVASTATIN CALCIUM 80 MG PO TABS
80.0000 mg | ORAL_TABLET | Freq: Every day | ORAL | Status: DC
  Administered 2018-03-08 – 2018-03-10 (×3): 80 mg via ORAL
  Filled 2018-03-08 (×3): qty 1

## 2018-03-08 MED ORDER — HEPARIN SODIUM (PORCINE) 5000 UNIT/ML IJ SOLN: 5000.0000 [IU] | Freq: Three times a day (TID) | INTRAMUSCULAR | Status: DC

## 2018-03-08 MED ORDER — DOBUTAMINE IN D5W 4-5 MG/ML-% IV SOLN
2.5000 ug/kg/min | INTRAVENOUS | Status: DC
  Administered 2018-03-08: 2.5 ug/kg/min via INTRAVENOUS
  Filled 2018-03-08: qty 250

## 2018-03-08 MED ORDER — FUROSEMIDE 10 MG/ML IJ SOLN
40.0000 mg | Freq: Once | INTRAMUSCULAR | Status: AC
  Administered 2018-03-08: 40 mg via INTRAVENOUS
  Filled 2018-03-08: qty 4

## 2018-03-08 MED ORDER — PROPOFOL 1000 MG/100ML IV EMUL
INTRAVENOUS | Status: AC
  Filled 2018-03-08: qty 100

## 2018-03-08 MED ORDER — ALBUTEROL SULFATE (2.5 MG/3ML) 0.083% IN NEBU
2.5000 mg | INHALATION_SOLUTION | RESPIRATORY_TRACT | Status: DC | PRN
  Administered 2018-03-17: 2.5 mg via RESPIRATORY_TRACT
  Filled 2018-03-08: qty 3

## 2018-03-08 MED ORDER — MAGNESIUM SULFATE 2 GM/50ML IV SOLN
2.0000 g | Freq: Once | INTRAVENOUS | Status: AC
  Administered 2018-03-08: 2 g via INTRAVENOUS
  Filled 2018-03-08: qty 50

## 2018-03-08 MED ORDER — FAMOTIDINE 40 MG/5ML PO SUSR
20.0000 mg | Freq: Two times a day (BID) | ORAL | Status: DC
  Administered 2018-03-08 – 2018-03-10 (×6): 20 mg via ORAL
  Filled 2018-03-08 (×7): qty 2.5

## 2018-03-08 MED ORDER — VITAMIN B-1 100 MG PO TABS
100.0000 mg | ORAL_TABLET | Freq: Every day | ORAL | Status: DC
  Administered 2018-03-09 – 2018-04-06 (×29): 100 mg
  Filled 2018-03-08 (×29): qty 1

## 2018-03-08 MED ORDER — NICOTINE 21 MG/24HR TD PT24
21.0000 mg | MEDICATED_PATCH | Freq: Every day | TRANSDERMAL | Status: DC
  Administered 2018-03-08 – 2018-04-01 (×24): 21 mg via TRANSDERMAL
  Filled 2018-03-08 (×25): qty 1

## 2018-03-08 MED ORDER — HEPARIN BOLUS VIA INFUSION
4000.0000 [IU] | Freq: Once | INTRAVENOUS | Status: AC
  Administered 2018-03-08: 4000 [IU] via INTRAVENOUS
  Filled 2018-03-08: qty 4000

## 2018-03-08 MED ORDER — HEPARIN BOLUS VIA INFUSION
2000.0000 [IU] | Freq: Once | INTRAVENOUS | Status: AC
  Administered 2018-03-08: 2000 [IU] via INTRAVENOUS
  Filled 2018-03-08: qty 2000

## 2018-03-08 MED ORDER — PROPOFOL 1000 MG/100ML IV EMUL
5.0000 ug/kg/min | INTRAVENOUS | Status: DC
  Administered 2018-03-08: 100 ug/kg/min via INTRAVENOUS
  Administered 2018-03-08: 40 ug/kg/min via INTRAVENOUS

## 2018-03-08 MED ORDER — FENTANYL CITRATE (PF) 100 MCG/2ML IJ SOLN
250.0000 ug | Freq: Once | INTRAMUSCULAR | Status: AC
  Administered 2018-03-08: 250 ug via INTRAVENOUS

## 2018-03-08 MED ORDER — SODIUM CHLORIDE 0.9 % IV SOLN
Freq: Once | INTRAVENOUS | Status: AC
  Administered 2018-03-08: 07:00:00 via INTRAVENOUS

## 2018-03-08 MED ORDER — PROPOFOL 10 MG/ML IV BOLUS
100.0000 mg | Freq: Once | INTRAVENOUS | Status: AC
  Administered 2018-03-08: 100 mg via INTRAVENOUS

## 2018-03-08 MED ORDER — ORAL CARE MOUTH RINSE
15.0000 mL | OROMUCOSAL | Status: DC
  Administered 2018-03-08 – 2018-03-12 (×39): 15 mL via OROMUCOSAL

## 2018-03-08 MED ORDER — METHYLPREDNISOLONE SODIUM SUCC 125 MG IJ SOLR
125.0000 mg | Freq: Once | INTRAMUSCULAR | Status: AC
  Administered 2018-03-08: 125 mg via INTRAVENOUS
  Filled 2018-03-08: qty 2

## 2018-03-08 MED ORDER — PREDNISONE 20 MG PO TABS
40.0000 mg | ORAL_TABLET | Freq: Every day | ORAL | Status: AC
  Administered 2018-03-08 – 2018-03-12 (×5): 40 mg
  Filled 2018-03-08 (×5): qty 2

## 2018-03-08 MED ORDER — PROPOFOL 1000 MG/100ML IV EMUL
5.0000 ug/kg/min | INTRAVENOUS | Status: DC
  Administered 2018-03-08: 50 ug/kg/min via INTRAVENOUS
  Administered 2018-03-08: 60 ug/kg/min via INTRAVENOUS
  Administered 2018-03-08: 30 ug/kg/min via INTRAVENOUS
  Administered 2018-03-08: 50 ug/kg/min via INTRAVENOUS
  Administered 2018-03-09: 40 ug/kg/min via INTRAVENOUS
  Administered 2018-03-09: 60 ug/kg/min via INTRAVENOUS
  Administered 2018-03-09: 80 ug/kg/min via INTRAVENOUS
  Administered 2018-03-09: 60 ug/kg/min via INTRAVENOUS
  Administered 2018-03-09 (×2): 70 ug/kg/min via INTRAVENOUS
  Administered 2018-03-10: 30 ug/kg/min via INTRAVENOUS
  Administered 2018-03-10: 60 ug/kg/min via INTRAVENOUS
  Administered 2018-03-10: 20 ug/kg/min via INTRAVENOUS
  Administered 2018-03-10 (×2): 40 ug/kg/min via INTRAVENOUS
  Administered 2018-03-11: 50 ug/kg/min via INTRAVENOUS
  Administered 2018-03-11 – 2018-03-12 (×2): 40 ug/kg/min via INTRAVENOUS
  Filled 2018-03-08 (×18): qty 100

## 2018-03-08 MED ORDER — CHLORHEXIDINE GLUCONATE CLOTH 2 % EX PADS
6.0000 | MEDICATED_PAD | Freq: Every day | CUTANEOUS | Status: DC
  Administered 2018-03-09 – 2018-03-14 (×3): 6 via TOPICAL

## 2018-03-08 MED ORDER — FENTANYL CITRATE (PF) 100 MCG/2ML IJ SOLN
INTRAMUSCULAR | Status: AC | PRN
  Administered 2018-03-08: 200 ug via INTRAVENOUS

## 2018-03-08 MED ORDER — FENTANYL CITRATE (PF) 100 MCG/2ML IJ SOLN
50.0000 ug | INTRAMUSCULAR | Status: DC | PRN
  Administered 2018-03-08 – 2018-03-15 (×27): 50 ug via INTRAVENOUS
  Filled 2018-03-08 (×27): qty 2

## 2018-03-08 MED ORDER — PROPOFOL 1000 MG/100ML IV EMUL
INTRAVENOUS | Status: AC
  Administered 2018-03-08: 40 ug/kg/min via INTRAVENOUS
  Filled 2018-03-08: qty 100

## 2018-03-08 MED ORDER — DOXYCYCLINE HYCLATE 100 MG PO TABS
100.0000 mg | ORAL_TABLET | Freq: Two times a day (BID) | ORAL | Status: DC
  Administered 2018-03-08 – 2018-03-12 (×9): 100 mg
  Filled 2018-03-08 (×9): qty 1

## 2018-03-08 MED ORDER — FOLIC ACID 1 MG PO TABS
1.0000 mg | ORAL_TABLET | Freq: Every day | ORAL | Status: DC
  Administered 2018-03-09 – 2018-04-06 (×29): 1 mg
  Filled 2018-03-08 (×30): qty 1

## 2018-03-08 MED ORDER — ASPIRIN 81 MG PO CHEW
81.0000 mg | CHEWABLE_TABLET | Freq: Every day | ORAL | Status: DC
  Administered 2018-03-08 – 2018-03-10 (×3): 81 mg via ORAL
  Filled 2018-03-08 (×4): qty 1

## 2018-03-08 MED ORDER — HEPARIN (PORCINE) 25000 UT/250ML-% IV SOLN
1900.0000 [IU]/h | INTRAVENOUS | Status: DC
  Administered 2018-03-08: 1250 [IU]/h via INTRAVENOUS
  Administered 2018-03-09: 1450 [IU]/h via INTRAVENOUS
  Administered 2018-03-10: 1700 [IU]/h via INTRAVENOUS
  Administered 2018-03-10: 1450 [IU]/h via INTRAVENOUS
  Administered 2018-03-11 (×2): 1700 [IU]/h via INTRAVENOUS
  Filled 2018-03-08 (×7): qty 250

## 2018-03-08 MED ORDER — KETAMINE HCL 10 MG/ML IJ SOLN
INTRAMUSCULAR | Status: AC | PRN
  Administered 2018-03-08: 200 mg via INTRAVENOUS

## 2018-03-08 MED ORDER — IPRATROPIUM-ALBUTEROL 0.5-2.5 (3) MG/3ML IN SOLN
3.0000 mL | Freq: Four times a day (QID) | RESPIRATORY_TRACT | Status: DC
  Administered 2018-03-08 – 2018-03-14 (×25): 3 mL via RESPIRATORY_TRACT
  Filled 2018-03-08 (×25): qty 3

## 2018-03-08 MED ORDER — CHLORHEXIDINE GLUCONATE 0.12% ORAL RINSE (MEDLINE KIT)
15.0000 mL | Freq: Two times a day (BID) | OROMUCOSAL | Status: DC
  Administered 2018-03-08 – 2018-03-12 (×8): 15 mL via OROMUCOSAL

## 2018-03-08 MED ORDER — NOREPINEPHRINE 4 MG/250ML-% IV SOLN
0.0000 ug/min | INTRAVENOUS | Status: DC
  Administered 2018-03-08: 30 ug/min via INTRAVENOUS
  Administered 2018-03-08 (×2): 10 ug/min via INTRAVENOUS
  Administered 2018-03-08: 11:00:00 via INTRAVENOUS
  Administered 2018-03-09: 4 ug/min via INTRAVENOUS
  Filled 2018-03-08 (×3): qty 250

## 2018-03-08 NOTE — ED Provider Notes (Signed)
[facility] Emergency Department Provider Note  ____________________________________________   First MD Initiated Contact with Patient 03/05/2018 [phone]     (approximate)  I have reviewed the triage vital signs and the nursing notes.   HISTORY  Chief Complaint Respiratory Distress  Level 5 exemption history is limited by the patient's clinical condition  HPI Brian [name] is a 62 y.o. male who comes to the emergency department via EMS in severe respiratory distress.  Apparently the patient has been somewhat short of breath for the past several days and has a known history of both CHF and COPD.  When EMS arrived they noted he was saturating in the low 80s with elevated respiratory rate.  He was initially unable to tolerate BiPAP however eventually was able to tolerate.  They are unable to establish IV access.  Was given 2 DuoNeb's followed by another 2 which were going in route with mild improvement in his symptoms.  EMS noted very poor air entry.    Past Medical History:  Diagnosis Date  . Alcohol abuse    Heavy alcohol abuse and homelessness  . Arthritis   . Cardiomyopathy ([place])   . CHF (congestive heart failure) ([place])   . Cocaine abuse ([place])    And crack   . COPD (chronic obstructive pulmonary disease) ([place])   . Heart failure   . Homelessness   . Noncompliance   . Paroxysmal VT ([place])    a. first seen 2013 in setting of normal LVEF 55-60%, EF now decreased.  . Suicide attempt ([place])   . Tobacco abuse     Patient Active Problem List   Diagnosis Date Noted  . Precordial chest pain   . Neck pain on left side 02/24/2018  . Left arm pain   . Left shoulder pain 02/23/2018  . Prolonged Q-T interval on ECG 01/06/2018  . Pain in right knee 01/06/2018  . Atypical chest pain 01/05/2018  . Hypokalemia 01/05/2018  . Acute on chronic combined systolic and diastolic CHF (congestive heart failure) ([place]) 01/05/2018  . Alcohol abuse 01/05/2018  . Pulmonary  contusion 01/01/2018  . COPD exacerbation ([place]) 12/15/2017  . Acute respiratory failure with hypoxia ([place]) 11/13/2017  . Goals of care, counseling/discussion   . Palliative care by specialist   . Acute on chronic systolic CHF (congestive heart failure), NYHA class 4 ([place]) 11/03/2017  . Hyperglycemia 11/03/2017  . Acute respiratory failure with hypoxia and hypercapnia ([place]) 10/26/2017  . Acute on chronic respiratory failure with hypoxia ([place]) 09/27/2017  . Tobacco dependence 09/27/2017  . Chronic systolic CHF (congestive heart failure) ([place]) 09/25/2017  . COPD with acute exacerbation ([place]) 09/30/2015  . Substance induced mood disorder ([place]) 02/04/2013  . Polysubstance (excluding opioids) dependence, daily use ([place]) 02/03/2013  . Alcohol dependence ([place]) 02/03/2013  . Gout attack 11/12/2012  . Closed fracture of 5th metacarpal 11/12/2012  . Chest pain 08/27/2012  . Acute exacerbation of chronic obstructive pulmonary disease (COPD) ([place])   . Homelessness   . Cocaine abuse [facility])     Past Surgical History:  Procedure Laterality Date  . INNER EAR SURGERY    . LEFT HEART CATH AND CORONARY ANGIOGRAPHY N/A 02/25/2018   Procedure: LEFT HEART CATH AND CORONARY ANGIOGRAPHY;  Surgeon: Burnell Blanks, MD;  Location: [place] [facility];  Service: Cardiovascular;  Laterality: N/A;    Prior to Admission medications   Medication Sig Start Date End Date Taking? Authorizing Provider  albuterol (PROVENTIL HFA;VENTOLIN HFA) 108 (90 Base)  MCG/ACT inhaler Inhale 2 puffs into the lungs every 6 (six) hours as needed for wheezing or shortness of breath. 02/26/18   Caren Griffins, MD  aspirin EC 81 MG EC tablet Take 1 tablet (81 mg total) by mouth daily. 02/27/18   Caren Griffins, MD  atorvastatin (LIPITOR) 80 MG tablet Take 1 tablet (80 mg total) by mouth daily at 6 PM. 02/26/18   Caren Griffins, MD  carvedilol (COREG) 6.25 MG tablet Take 1 tablet (6.25 mg total) by mouth 2 (two) times  daily with a meal. 01/07/18 01/02/19  Eulogio Bear U, DO  diclofenac sodium (VOLTAREN) 1 % GEL Apply 4 g topically 4 (four) times daily. 02/26/18   Caren Griffins, MD  doxycycline (VIBRA-TABS) 100 MG tablet Take 1 tablet (100 mg total) by mouth 2 (two) times daily. 02/26/18   Caren Griffins, MD  folic acid (FOLVITE) 1 MG tablet Take 1 tablet (1 mg total) by mouth daily. 01/07/18 07/06/18  Geradine Girt, DO  furosemide (LASIX) 40 MG tablet Take 1 tablet (40 mg total) by mouth daily. 02/26/18 02/26/19  Caren Griffins, MD  lisinopril (PRINIVIL,ZESTRIL) 5 MG tablet Take 2 tablets (10 mg total) by mouth daily. 02/26/18   Caren Griffins, MD  mometasone-formoterol (DULERA) 200-5 MCG/ACT AERO Inhale 2 puffs into the lungs 2 (two) times daily. 02/26/18   Caren Griffins, MD  nicotine (NICODERM CQ - DOSED IN MG/24 HOURS) 21 mg/24hr patch Place 1 patch (21 mg total) onto the skin daily. Patient not taking: Reported on 01/05/2018 11/08/17   Clent Demark, PA-C  predniSONE (DELTASONE) 20 MG tablet Take 2 tablets (40 mg total) by mouth daily. 02/26/18   Caren Griffins, MD    Allergies Patient has no known allergies.  Family History  Problem Relation Age of Onset  . Coronary artery disease Unknown   . Diabetes Unknown   . Cancer Sister     Social History Social History   Tobacco Use  . Smoking status: Current Every Day Smoker    Packs/day: 2.00    Types: Cigarettes  . Smokeless tobacco: Never Used  Substance Use Topics  . Alcohol use: Yes    Comment: Heavy. 5-10 40oz daily  . Drug use: No    Types: Marijuana, Cocaine    Comment: Marijuana and cocaine, crack    Review of Systems Level 5 exemption history is limited by the patient's clinical condition ____________________________________________   PHYSICAL EXAM:  VITAL SIGNS: ED Triage Vitals [02/09/2018 0603]  Enc Vitals Group     BP      Pulse Rate (!) 120     Resp (!) 34     Temp      Temp src      SpO2 99 %      Weight      Height      Head Circumference      Peak Flow      Pain Score      Pain Loc      Pain Edu?      Excl. in [place]?     Constitutional: Appears critically ill in severe respiratory distress tolerating BiPAP but still using accessory muscles Eyes: PERRL EOMI. midrange and brisk Head: Atraumatic. Nose: No congestion/rhinnorhea. Mouth/Throat: No trismus Neck: Unable to lie completely flat.  Large JVD.   Cardiovascular: Tachycardic rate, regular rhythm. Grossly normal heart sounds.  Good peripheral circulation. Respiratory: Severe respiratory distress.  Crackles in all fields  and moving very little air Gastrointestinal: Soft nontender.  Umbilical hernia visualized when breathing heavily Musculoskeletal: 1+ pitting edema to midshin bilaterally legs equal in size Neurologic:  No gross focal neurologic deficits are appreciated. Skin: Diaphoretic Psychiatric: Anxious and panicked appearing  ____________________________________________   DIFFERENTIAL includes but not limited to  Flash pulmonary edema, pneumothorax, pulmonary embolism, COPD exacerbation, pneumonia, influenza ____________________________________________   LABS (all labs ordered are listed, but only abnormal results are displayed)  Labs Reviewed  COMPREHENSIVE METABOLIC PANEL - Abnormal; Notable for the following components:      Result Value   CO2 33 (*)    Glucose, Bld 136 (*)    AST 46 (*)    ALT 70 (*)    All other components within normal limits  BRAIN NATRIURETIC PEPTIDE - Abnormal; Notable for the following components:   B Natriuretic Peptide 823.0 (*)    All other components within normal limits  TROPONIN I - Abnormal; Notable for the following components:   Troponin I 0.04 (*)    All other components within normal limits  CBC WITH DIFFERENTIAL/PLATELET - Abnormal; Notable for the following components:   WBC 12.1 (*)    Hemoglobin 18.0 (*)    HCT 57.3 (*)    MCV 101.1 (*)    Neutro Abs 10.2 (*)     All other components within normal limits  URINALYSIS, COMPLETE (UACMP) WITH MICROSCOPIC - Abnormal; Notable for the following components:   Color, Urine AMBER (*)    APPearance CLOUDY (*)    Hgb urine dipstick SMALL (*)    Protein, ur >=300 (*)    Bacteria, UA FEW (*)    All other components within normal limits  BLOOD GAS, VENOUS - Abnormal; Notable for the following components:   pCO2, Ven 85 (*)    Bicarbonate 39.0 (*)    Acid-Base Excess 8.6 (*)    All other components within normal limits  URINE DRUG SCREEN, QUALITATIVE ([facility] ONLY) - Abnormal; Notable for the following components:   Cannabinoid 50 Ng, Ur [place] POSITIVE (*)    All other components within normal limits  LACTIC ACID, PLASMA  CK  INFLUENZA PANEL BY PCR (TYPE A & B)  LACTIC ACID, PLASMA  PROCALCITONIN    Lab work reviewed by me with a number of abnormalities most notably elevated BNP and troponin likely secondary to stretch.  Transaminitis could be secondary to alcohol abuse given his increased MCV versus hepatic congestion from his heart failure __________________________________________  EKG  ED ECG REPORT I, Darel Hong, the attending physician, personally viewed and interpreted this ECG.  Date: 02/16/2018 EKG Time: 0636 Rate: 98 Rhythm: normal sinus rhythm QRS Axis: Rightward axis Intervals: Short PR long QT ST/T Wave abnormalities: Mild ST depression in V4 and V5 as well as lead I with no reciprocal elevation. Narrative Interpretation: Abnormal EKG suggestive of diffuse ischemia but not STEMI  ED ECG REPORT I, Darel Hong, the attending physician, personally viewed and interpreted this ECG.  Date: 02/15/2018 EKG Time: 0654 Rate: 66 Rhythm: normal sinus rhythm QRS Axis: Rightward axis Intervals: normal ST/T Wave abnormalities: T wave inversion in V5 V6 with no ST elevation Narrative Interpretation: Consistent with diffuse mild ischemia but no  STEMI  ____________________________________________  RADIOLOGY  chest x-ray reviewed by me with ET tube and OG tube in good position Abdominal x-ray reviewed by me shows OG tube in good position ____________________________________________   PROCEDURES  Procedure(s) performed:   .Critical Care Performed by: Darel Hong, MD Authorized  by: Darel Hong, MD   Critical care provider statement:    Critical care time (minutes):  65   Critical care time was exclusive of:  Separately billable procedures and treating other patients   Critical care was necessary to treat or prevent imminent or life-threatening deterioration of the following conditions:  Shock and respiratory failure   Critical care was time spent personally by me on the following activities:  Development of treatment plan with patient or surrogate, discussions with consultants, evaluation of patient's response to treatment, examination of patient, obtaining history from patient or surrogate, ordering and performing treatments and interventions, ordering and review of laboratory studies, ordering and review of radiographic studies, pulse oximetry, re-evaluation of patient's condition and review of old charts Procedure Name: Intubation Date/Time: 02/22/2018 7:46 AM Performed by: Darel Hong, MD Pre-anesthesia Checklist: Patient identified, Patient being monitored, Emergency Drugs available, Timeout performed and Suction available Oxygen Delivery Method: Non-rebreather mask Preoxygenation: Pre-oxygenation with 100% oxygen Induction Type: Rapid sequence Ventilation: Mask ventilation without difficulty Laryngoscope Size: Mac and 4 Grade View: Grade I Tube size: 8.0 mm Number of attempts: 1 Placement Confirmation: ETT inserted through vocal cords under direct vision,  CO2 detector and Breath sounds checked- equal and bilateral Secured at: 24 cm Tube secured with: ETT holder Comments: Intubation was technically not  difficult however the patient did desaturate quickly and bottomed out around 35% although came up to around 81% with bagging.  I subsequently disconnected him from the vent and put all of my weight on his chest forcing expiration with immediate improvement in his symptoms    OG placement Date/Time: 02/10/2018 7:47 AM Performed by: Darel Hong, MD Authorized by: Darel Hong, MD  Consent: The procedure was performed in an emergent situation.  Sedation: Patient sedated: yes  Patient tolerance: Patient tolerated the procedure well with no immediate complications  .Central Line Date/Time: 02/17/2018 7:48 AM Performed by: Darel Hong, MD Authorized by: Darel Hong, MD   Consent:    Consent obtained:  Emergent situation Pre-procedure details:    Hand hygiene: Hand hygiene performed prior to insertion     Sterile barrier technique: All elements of maximal sterile technique followed     Skin preparation:  2% chlorhexidine   Skin preparation agent: Skin preparation agent completely dried prior to procedure   Sedation:    Sedation type:  Deep Procedure details:    Location:  R femoral   Patient position:  Reverse Trendelenburg   Procedural supplies:  Triple lumen   Landmarks identified: yes     Ultrasound guidance: no     Number of attempts:  1   Successful placement: yes   Post-procedure details:    Post-procedure:  Dressing applied and line sutured   Assessment:  Blood return through all ports and free fluid flow   Patient tolerance of procedure:  Tolerated well, no immediate complications   Angiocath insertion Performed by: Darel Hong  Consent: Verbal consent obtained. Risks and benefits: risks, benefits and alternatives were discussed Time out: Immediately prior to procedure a "time out" was called to verify the correct patient, procedure, equipment, support staff and site/side marked as required.  Preparation: Patient was prepped and draped in the usual  sterile fashion.  Vein Location: left AC  Ultrasound Guided  Gauge: 18  Normal blood return and flush without difficulty Patient tolerance: Patient tolerated the procedure well with no immediate complications.     Critical Care performed: yes  ____________________________________________   INITIAL IMPRESSION / ASSESSMENT AND PLAN /  ED COURSE  Pertinent labs & imaging results that were available during my care of the patient were reviewed by me and considered in my medical decision making (see chart for details).   As part of my medical decision making, I reviewed the following data within the [place] History obtained from family if available, nursing notes, old chart and ekg, as well as notes from prior ED visits.  The patient arrives in the emergency department critically ill-appearing.  He is hypoxic on room air and barely tolerating BiPAP secondary to anxiety and air hunger.  We were initially unable to obtain IV access and I failed to external jugular IVs.  I was subsequently able to place a left antecubital fossa 18-gauge IV and decision was made to intubate the patient given his respiratory distress.  He was preoxygenated after ketamine induction and he was saturating 100% when we gave succinylcholine.  He became hypoxic quite quickly following intubation and after bagging we were only able to get his set up to about 81%.  Given his history of COPD I had RT disconnect the vent and I got on top of his chest and for about 10 seconds I decompressed his chest eliminating a large amount of trapped air.  I then also increased his PEEP from 5-8 and his saturation went up immediately.  Given his critical status I placed a right femoral triple-lumen central line using landmarks in full sterile condition and during the central line it was noted that his blood pressure was critically low.  Levophed was found.  I then performed a bedside echocardiogram showing severely reduced  left ventricular function along with a completely solid IVC suggestive of cardiogenic shock.  I then ordered dobutamine.  First 2 EKGs are grossly diffusely ischemic however not STEMI.  I have a call out to cardiology now.     ----------------------------------------- 7:41 AM on 02/27/2018 -----------------------------------------  After the patient was intubated and placed on multiple vasopressors around 6:55 AM I spoke with on-call [place] cardiologist Dr. Curt Bears who agreed the patient did not require emergent Cath Lab activation.  He recommended continued medical management and to touch base with our in-house cardiologist however given her lack of capacity as her ICU was completely full he did feel that transfer to [facility] for heart failure specialist treatment would be beneficial.  I then spoke with Dr. Fletcher Anon who agreed with continued medical management and agreed the patient would not benefit from cardiac catheterization and at this point as he is responding to vasopressors would not likely require an emergent balloon pump.  I then spoke with [facility] intensivist Dr. Halford Chessman who has graciously agreed to accept the patient to his service as a transfer. ____________________________________________  ----------------------------------------- 8:29 AM on 02/17/2018 -----------------------------------------  About half hour ago I was in the patient's room evaluating his hemodynamics when he suddenly awoke and sat up and nearly self extubated.  Respiratory therapy was in the room with me.  No nurses were available and his propofol was not hooked up so I bolused 250 mcg of fentanyl and grabbed a 10 cc syringe and pulled 100 mg of propofol and infused into his left antecubital fossa and flushed it.  The patient subsequently was calmed down again.  His primary nurse then came to bedside and restarted his propofol infusion.  Respiratory therapy was present for all drug administration.  I only  administered the drug secondary to his acute critical illness, he became hypoxic during the event,  was panicked, and nearly self extubated.  FINAL CLINICAL IMPRESSION(S) / ED DIAGNOSES  Final diagnoses:  Acute respiratory failure with hypoxia (HCC)  Cardiogenic shock (HCC)      NEW MEDICATIONS STARTED DURING THIS VISIT:  New Prescriptions   No medications on file     Note:  This document was prepared using Dragon voice recognition software and may include unintentional dictation errors.     Darel Hong, MD 02/25/2018 [phone]

## 2018-03-08 NOTE — ED Notes (Signed)
MD preparing to intubate at this time.

## 2018-03-08 NOTE — ED Notes (Signed)
MD Rifenbark placing central line at this time

## 2018-03-08 NOTE — Progress Notes (Signed)
Preliminary notes---Bilateral lower extremities venous duplex exam completed.   Right: Findings consistent with acute deep vein thrombosis involving the right common femoral vein, right femoral vein, and right proximal profunda vein. No cystic structure found in the popliteal fossa. ----There is a circular thrombosis seen at CFV-FV prox where the head of Central Venous Catheter located.    Left: There is no evidence of deep vein thrombosis in the lower extremity. However, portions of this examination were limited- see technologist comments above. No cystic structure found in the popliteal fossa.  Brian [name] H Robben Jagiello(RDMS RVT) 02/15/2018 3:14 PM

## 2018-03-08 NOTE — H&P (Addendum)
NAME:  Brian [name], MRN:  [id number], DOB:  Dec 24, 1955, LOS: 0 ADMISSION DATE:  03/14/18, CONSULTATION DATE:  2018-03-14 REFERRING MD: Merrily Brittle, MD CHIEF COMPLAINT:  Acute respiratory failure   Brief History   Brian [name] is 62 y.o. M with a PMH of COPD, systolic CHF with EF 25-30%, substance use, paroxysmal VT, tobacco use and multiple prior hospitalizations resulting in patient leaving against medical advice who presented to [facility] ED on Mar 14, 2018 for respiratory distress and ultimately intubated for hypercapnic respiratory failure.  History of present illness   Patient sedated on ventilator and unable to provide history. History obtained via chart review.  Per ED physician note, "comes to the emergency department via EMS in severe respiratory distress.  Apparently the patient has been somewhat short of breath for the past several days and has a known history of both CHF and COPD.  When EMS arrived they noted he was saturating in the low 80s with elevated respiratory rate.  He was initially unable to tolerate BiPAP however eventually was able to tolerate.  They are unable to establish IV access.  Was given 2 DuoNeb's followed by another 2 which were going in route with mild improvement in his symptoms.   Patient was hypoxic despite supplemental oxygen and could not tolerate Bipap thus was intubated. He was also started on Levophed for shock as well as Dobutamine for concern of cardiogenic shock in setting of known sCHF.  Past Medical History   Past Medical History:  Diagnosis Date  . Alcohol abuse    Heavy alcohol abuse and homelessness  . Arthritis   . Cardiomyopathy (HCC)   . CHF (congestive heart failure) (HCC)   . Cocaine abuse (HCC)    And crack   . COPD (chronic obstructive pulmonary disease) (HCC)   . Heart failure   . Homelessness   . Noncompliance   . Paroxysmal VT (HCC)    a. first seen 2013 in setting of normal LVEF 55-60%, EF now decreased.  . Suicide  attempt (HCC)   . Tobacco abuse      Significant Hospital Events   2018/03/14 - Intubated in ED  Consults:  Cardiology  Procedures:  11/ 29 CVC in ED, R fem  Significant Diagnostic Tests:  n/a  Micro Data:  none  Antimicrobials:  Doxy 11/29 >   Interim history/subjective:  Transferred from [place] ED to [facility] CV[facility]. On ventilator as well as Dobutamine and Norepinephrine.  Objective   Blood pressure 108/78, pulse (!) 115, resp. rate 16, SpO2 95 %.    Vent Mode: PRVC FiO2 (%):  [60 %-100 %] 60 % Set Rate:  [16 bmp] 16 bmp Vt Set:  [510 mL-550 mL] 510 mL PEEP:  [8 cmH20] 8 cmH20 Plateau Pressure:  [15 cmH20] 15 cmH20  No intake or output data in the 24 hours ending 2018-03-14 1048 There were no vitals filed for this visit.  Examination: General: Sedated, but responds to voice HENT: Normocephalic, ETT in place, eyes close Lungs: Diminished sounds throughout, mechanically ventilated Cardiovascular: Tachycardic, no murmur Abdomen: Soft, mild-distention, minimal bowel sounds Extremities: warm, palpable pulses Neuro: sedated GU: Foley in place  Resolved Hospital Problem list   None  Assessment & Plan:  Critically-ill secondary to hypoxic and hypercapnic respiratory failure requiring mechanical ventilation; likely acute on chronic given elevated HCT and HCO3 Critically-ill secondary to shock, cardiogenic versus disrtibutive H/O CAD, sCHF w/ EF 25-30% H/O COPD H/O Alcohol abuse  Plan: - Full vent support, VAP bundle;  wean as tolerate. Serial VBG. - Wean dobutamine and favor NE given no evidence of cardiogenic shock on exam with warm extremities. Patient also with ongoing tachycardia and history of paroxysmal VT, thus dobutamine likely exacerbating this. Shock likely a result of acidemia from respiratory failure - Check LE duplex to evaluate for DVT. If hypoxia does not improve, will consider CTA to evaluate for PE - Cardiology consulted, appreciate assistance - Scheduled  Duonebs and start Doxy and Pred 40 mg x 5 days for COPD exacerbation - Continue home Aspirin, Atorvastatin - Propofol gtt and fentanyl injections for sedation currently; wean as respiratory status improves - Nicotine patch for h/o tobacco abuse - Holding home Lasix, Coreg, Lisinopril currently given shock - Unclear last alcohol drink, but will need to watch for withdrawal following sedation wean  Best practice:  Diet: NPO for now; consult RD for tube feeds Pain/Anxiety/Delirium protocol (if indicated): Ordered VAP protocol (if indicated): Ordered DVT prophylaxis: Heparin TID GI prophylaxis: Famotidine Glucose control: n/a Mobility: bedrest, ADAT Code Status: Full Family Communication: none present Disposition: ICU  Labs   CBC: Recent Labs  Lab Mar 15, 2018 0612  WBC 12.1*  NEUTROABS 10.2*  HGB 18.0*  HCT 57.3*  MCV 101.1*  PLT 211    Basic Metabolic Panel: Recent Labs  Lab 03/15/18 0612  NA 143  K 4.8  CL 101  CO2 33*  GLUCOSE 136*  BUN 18  CREATININE 0.72  CALCIUM 9.4   GFR: Estimated Creatinine Clearance: 102.2 mL/min (by C-G formula based on SCr of 0.72 mg/dL). Recent Labs  Lab 2018/03/15 0610 03/15/2018 0612  WBC  --  12.1*  LATICACIDVEN 0.9  --     Liver Function Tests: Recent Labs  Lab 03/15/2018 0612  AST 46*  ALT 70*  ALKPHOS 49  BILITOT 0.7  PROT 7.7  ALBUMIN 4.3   No results for input(s): LIPASE, AMYLASE in the last 168 hours. No results for input(s): AMMONIA in the last 168 hours.  ABG    Component Value Date/Time   PHART 7.41 March 15, 2018 0909   PCO2ART 52 (H) 2018-03-15 0909   PO2ART 64 (L) March 15, 2018 0909   HCO3 33.0 (H) 03-15-18 0909   TCO2 31 12/15/2017 1614   ACIDBASEDEF 1.0 12/15/2017 1614   O2SAT 92.3 March 15, 2018 0909     Coagulation Profile: No results for input(s): INR, PROTIME in the last 168 hours.  Cardiac Enzymes: Recent Labs  Lab 03/15/2018 0612  CKTOTAL 138  TROPONINI 0.04*    HbA1C: Hgb A1c MFr Bld  Date/Time  Value Ref Range Status  02/24/2018 03:02 AM 5.2 4.8 - 5.6 % Final    Comment:    (NOTE) Pre diabetes:          5.7%-6.4% Diabetes:              >6.4% Glycemic control for   <7.0% adults with diabetes   11/03/2017 06:51 PM 5.7 (H) 4.8 - 5.6 % Final    Comment:    (NOTE) Pre diabetes:          5.7%-6.4% Diabetes:              >6.4% Glycemic control for   <7.0% adults with diabetes     CBG: No results for input(s): GLUCAP in the last 168 hours.  Review of Systems:   Unable to complete at this time due to patient status  Past Medical History  He,  has a past medical history of Alcohol abuse, Arthritis, Cardiomyopathy (HCC), CHF (congestive heart  failure) (HCC), Cocaine abuse (HCC), COPD (chronic obstructive pulmonary disease) (HCC), Heart failure, Homelessness, Noncompliance, Paroxysmal VT (HCC), Suicide attempt (HCC), and Tobacco abuse.   Surgical History    Past Surgical History:  Procedure Laterality Date  . INNER EAR SURGERY    . LEFT HEART CATH AND CORONARY ANGIOGRAPHY N/A 02/25/2018   Procedure: LEFT HEART CATH AND CORONARY ANGIOGRAPHY;  Surgeon: Kathleene Hazel, MD;  Location: [facility] INVASIVE CV LAB;  Service: Cardiovascular;  Laterality: N/A;     Social History   reports that he has been smoking cigarettes. He has been smoking about 2.00 packs per day. He has never used smokeless tobacco. He reports that he drinks alcohol. He reports that he does not use drugs.   Family History   His family history includes Cancer in his sister; Coronary artery disease in his unknown relative; Diabetes in his unknown relative.   Allergies No Known Allergies   Home Medications  Prior to Admission medications   Medication Sig Start Date End Date Taking? Authorizing Provider  albuterol (PROVENTIL HFA;VENTOLIN HFA) 108 (90 Base) MCG/ACT inhaler Inhale 2 puffs into the lungs every 6 (six) hours as needed for wheezing or shortness of breath. 02/26/18   Leatha Gilding, MD    aspirin EC 81 MG EC tablet Take 1 tablet (81 mg total) by mouth daily. 02/27/18   Leatha Gilding, MD  atorvastatin (LIPITOR) 80 MG tablet Take 1 tablet (80 mg total) by mouth daily at 6 PM. 02/26/18   Leatha Gilding, MD  carvedilol (COREG) 6.25 MG tablet Take 1 tablet (6.25 mg total) by mouth 2 (two) times daily with a meal. 01/07/18 01/02/19  Marlin Canary U, DO  diclofenac sodium (VOLTAREN) 1 % GEL Apply 4 g topically 4 (four) times daily. 02/26/18   Leatha Gilding, MD  doxycycline (VIBRA-TABS) 100 MG tablet Take 1 tablet (100 mg total) by mouth 2 (two) times daily. 02/26/18   Leatha Gilding, MD  folic acid (FOLVITE) 1 MG tablet Take 1 tablet (1 mg total) by mouth daily. 01/07/18 07/06/18  Joseph Art, DO  furosemide (LASIX) 40 MG tablet Take 1 tablet (40 mg total) by mouth daily. 02/26/18 02/26/19  Leatha Gilding, MD  lisinopril (PRINIVIL,ZESTRIL) 5 MG tablet Take 2 tablets (10 mg total) by mouth daily. 02/26/18   Leatha Gilding, MD  mometasone-formoterol (DULERA) 200-5 MCG/ACT AERO Inhale 2 puffs into the lungs 2 (two) times daily. 02/26/18   Leatha Gilding, MD  nicotine (NICODERM CQ - DOSED IN MG/24 HOURS) 21 mg/24hr patch Place 1 patch (21 mg total) onto the skin daily. Patient not taking: Reported on 01/05/2018 11/08/17   [name], PA-C  predniSONE (DELTASONE) 20 MG tablet Take 2 tablets (40 mg total) by mouth daily. 02/26/18   Leatha Gilding, MD     Critical care time: [address] MD [place] [facility]

## 2018-03-08 NOTE — Progress Notes (Signed)
VBG results pH 7.28, pCO2 74.4. TCO2 37, O2 36. MD made aware.

## 2018-03-08 NOTE — Progress Notes (Signed)
  Echocardiogram 2D Echocardiogram has been performed.  Celene Skeen 02/28/2018, 3:37 PM

## 2018-03-08 NOTE — ED Triage Notes (Signed)
Pt from home via EMS for worsening SOB x3days. Per EMS pt was 80% on RA upon arrival and tachypnea on arrival. PT placed on c-pap and given 4 duonebs in route, diminished lung sounds. MD and RT at bedside.

## 2018-03-08 NOTE — Progress Notes (Addendum)
Pt had 12 beat run VT. MD notified. Orders received to draw mag, phos, and potassium.

## 2018-03-08 NOTE — Progress Notes (Signed)
ANTICOAGULATION CONSULT NOTE - Initial Consult  Pharmacy Consult for heparin  Indication: DVT  No Known Allergies  Patient Measurements:   Heparin Dosing Weight: 85kg  Vital Signs: Temp: 98.1 F (36.7 C) (11/29 1126) Temp Source: Axillary (11/29 1126) BP: 125/108 (11/29 1415) Pulse Rate: 63 (11/29 1415)  Labs: Recent Labs    02/24/2018 0612  HGB 18.0*  HCT 57.3*  PLT 211  CREATININE 0.72  CKTOTAL 138  TROPONINI 0.04*    Estimated Creatinine Clearance: 102.2 mL/min (by C-G formula based on SCr of 0.72 mg/dL).   Medical History: Past Medical History:  Diagnosis Date  . Alcohol abuse    Heavy alcohol abuse and homelessness  . Arthritis   . Cardiomyopathy (HCC)   . CHF (congestive heart failure) (HCC)   . Cocaine abuse (HCC)    And crack   . COPD (chronic obstructive pulmonary disease) (HCC)   . Heart failure   . Homelessness   . Noncompliance   . Paroxysmal VT (HCC)    a. first seen 2013 in setting of normal LVEF 55-60%, EF now decreased.  . Suicide attempt (HCC)   . Tobacco abuse     Assessment: 62yom transfered from [facility] intubated and sedated for respiratory distress and hypotension.  New + DVT will start heparin, CBC ok will follow.   Goal of Therapy:  Heparin level 0.3-0.7 units/ml Monitor platelets by anticoagulation protocol: Yes   Plan:  Heparin bolus 4000 uts iv x1  Heparin drip 1250 uts/hr Check 6hr HL Daily HL, CBC  Leota Sauers Pharm.D. CPP, BCPS Clinical Pharmacist [phone] 02/13/2018 3:14 PM

## 2018-03-08 NOTE — Progress Notes (Signed)
CRITICAL VALUE ALERT  Critical Value:  Carboxyhemoglobin 5.1  Date & Time Notied:  02/26/18 1130  Provider Notified: [place]  Orders Received/Actions taken: MD made aware. No new orders at this time.

## 2018-03-08 NOTE — Progress Notes (Signed)
Initial Nutrition Assessment  DOCUMENTATION CODES:   Not applicable  INTERVENTION:   Initiate tube feeding via OG tube: - Vital High Protein @ 50 ml/hr (1200 ml/day)  Tube feeding regimen provides 1200 kcal, 105 grams of protein, and 1008 ml of H2O.   Tube feeding regimen and current propofol provides 1739 total kcal (98% of needs).  NUTRITION DIAGNOSIS:   Inadequate oral intake related to inability to eat as evidenced by NPO status.  GOAL:   Patient will meet greater than or equal to 90% of their needs  MONITOR:   Vent status, Labs, I & O's, Weight trends, TF tolerance  REASON FOR ASSESSMENT:        ASSESSMENT:   62 year old male who [facility] ED on 11/29 with acute respiratory failure. Pt was intubated in the ED. PMH significant for COPD, CHF, substance use, tobacco use, and multiple prior hospitalizations with pt leaving AMA.   Discussed pt with RN.  No family present at time of visit so unable to obtain diet or weight history. Per weight history in chart, pt's weight appears to have been stable over the past 6 months.  Patient is currently intubated on ventilator support MVe: 9.9 L/min Temp (24hrs), Avg:98 F (36.7 C), Min:97.8 F (36.6 C), Max:98.1 F (36.7 C) BP: 110/70 MAP: 83  Propofol: 20.4 ml/hr (provides 539 kcal/day) Levophed: 113 ml/hr  Medications reviewed and include: Pepcid, prednisone  Labs reviewed: elevated LFTs  NUTRITION - FOCUSED PHYSICAL EXAM:    Most Recent Value  Orbital Region  No depletion  Upper Arm Region  No depletion  Thoracic and Lumbar Region  No depletion  Buccal Region  Unable to assess  Temple Region  No depletion  Clavicle Bone Region  No depletion  Clavicle and Acromion Bone Region  No depletion  Scapular Bone Region  Unable to assess  Dorsal Hand  No depletion  Patellar Region  No depletion  Anterior Thigh Region  No depletion  Posterior Calf Region  No depletion  Edema (RD Assessment)  Mild  [generalized]  Hair  Reviewed  Eyes  Unable to assess  Mouth  Unable to assess  Skin  Reviewed  Nails  Reviewed       Diet Order:   Diet Order            Diet NPO time specified  Diet effective now              EDUCATION NEEDS:   Not appropriate for education at this time  Skin:  Skin Assessment: Reviewed RN Assessment  Last BM:  PTA/unknown  Height:   Ht Readings from Last 1 Encounters:  02/24/18 5\' 7"  (1.702 m)    Weight:   Wt Readings from Last 1 Encounters:  02/26/18 84.9 kg    Ideal Body Weight:  67.27 kg (estimated using weight from 11/19)  BMI:  29.3 kg/m^2 (calculated using weight from 11/19 and height from 11/17)  Estimated Nutritional Needs:   Kcal:  1781  Protein:  100-115 grams  Fluid:  >/= 1.7 L    Earma Reading, MS, RD, LDN Inpatient Clinical Dietitian Pager: [phone] Weekend/After Hours: [phone]

## 2018-03-08 NOTE — Progress Notes (Signed)
2029: 6 beats of VTach. Electrolytes were checked this evening and are within range. Reported to [place] Haw, Charity fundraiser.  No new orders.   2152: I asked if patient needs to continue to be on Droplet Precautions.  Dr. Darrick Penna stated patient must remain on Droplet Precautions.

## 2018-03-08 NOTE — ED Notes (Signed)
EMTALA reviewed by this RN. Pt is intubated at this time and is unable to sign consent for transfer. No family is present at the bedside at this time.

## 2018-03-08 NOTE — Progress Notes (Signed)
ANTICOAGULATION CONSULT NOTE   Pharmacy Consult for heparin  Indication: DVT  No Known Allergies  Patient Measurements: Height: 5\' 7"  (170.2 cm) Weight: 197 lb 15.6 oz (89.8 kg) IBW/kg (Calculated) : 66.1 Heparin Dosing Weight: 85kg  Vital Signs: Temp: 98.4 F (36.9 C) (11/29 2000) Temp Source: Axillary (11/29 2000) BP: 117/83 (11/29 2300) Pulse Rate: 71 (11/29 2300)  Labs: Recent Labs    02/21/2018 0612 02/14/2018 1703 02/13/2018 2216  HGB 18.0*  --   --   HCT 57.3*  --   --   PLT 211  --   --   HEPARINUNFRC  --   --  0.15*  CREATININE 0.72  --   --   CKTOTAL 138  --   --   TROPONINI 0.04* 0.03*  --     Estimated Creatinine Clearance: 105 mL/min (by C-G formula based on SCr of 0.72 mg/dL).   Medical History: Past Medical History:  Diagnosis Date  . Alcohol abuse    Heavy alcohol abuse and homelessness  . Arthritis   . Cardiomyopathy (HCC)   . CHF (congestive heart failure) (HCC)   . Cocaine abuse (HCC)    And crack   . COPD (chronic obstructive pulmonary disease) (HCC)   . Heart failure   . Homelessness   . Noncompliance   . Paroxysmal VT (HCC)    a. first seen 2013 in setting of normal LVEF 55-60%, EF now decreased.  . Suicide attempt (HCC)   . Tobacco abuse     Assessment: 62yom transfered from [facility] intubated and sedated for respiratory distress and hypotension.  New + DVT will start heparin, CBC ok will follow. Initial heparin level 0.15 units/ml   Goal of Therapy:  Heparin level 0.3-0.7 units/ml Monitor platelets by anticoagulation protocol: Yes   Plan:  Heparin bolus 2000 uts iv x1  Heparin drip 1450 uts/hr Check 6hr HL Daily HL, CBC  Thanks for allowing pharmacy to be a part of this patient's care.  Talbert Cage, PharmD Clinical Pharmacist

## 2018-03-08 NOTE — Progress Notes (Signed)
[facility] from [facility] after transfer from [facility]. ETT charted at 24 but found at 26. CXR will be obtained and placement verified. RN aware. RT will continue to monitor.

## 2018-03-09 ENCOUNTER — Other Ambulatory Visit: Payer: Self-pay

## 2018-03-09 ENCOUNTER — Encounter (HOSPITAL_COMMUNITY): Payer: Self-pay | Admitting: *Deleted

## 2018-03-09 LAB — GLUCOSE, CAPILLARY
Glucose-Capillary: 141 mg/dL — ABNORMAL HIGH (ref 70–99)
Glucose-Capillary: 143 mg/dL — ABNORMAL HIGH (ref 70–99)
Glucose-Capillary: 150 mg/dL — ABNORMAL HIGH (ref 70–99)
Glucose-Capillary: 151 mg/dL — ABNORMAL HIGH (ref 70–99)
Glucose-Capillary: 160 mg/dL — ABNORMAL HIGH (ref 70–99)
Glucose-Capillary: 201 mg/dL — ABNORMAL HIGH (ref 70–99)

## 2018-03-09 LAB — CBC
HCT: 51 % (ref 39.0–52.0)
Hemoglobin: 15.6 g/dL (ref 13.0–17.0)
MCH: 31 pg (ref 26.0–34.0)
MCHC: 30.6 g/dL (ref 30.0–36.0)
MCV: 101.2 fL — AB (ref 80.0–100.0)
NRBC: 0 % (ref 0.0–0.2)
Platelets: 194 10*3/uL (ref 150–400)
RBC: 5.04 MIL/uL (ref 4.22–5.81)
RDW: 12.8 % (ref 11.5–15.5)
WBC: 15.6 10*3/uL — ABNORMAL HIGH (ref 4.0–10.5)

## 2018-03-09 LAB — BASIC METABOLIC PANEL
Anion gap: 8 (ref 5–15)
BUN: 26 mg/dL — ABNORMAL HIGH (ref 6–20)
CALCIUM: 8.9 mg/dL (ref 8.9–10.3)
CO2: 32 mmol/L (ref 22–32)
Chloride: 98 mmol/L (ref 98–111)
Creatinine, Ser: 0.94 mg/dL (ref 0.61–1.24)
GFR calc Af Amer: >60 mL/min (ref >60)
GFR calc non Af Amer: >60 mL/min (ref >60)
Glucose, Bld: 164 mg/dL — ABNORMAL HIGH (ref 70–99)
Potassium: 4.2 mmol/L (ref 3.5–5.1)
Sodium: 138 mmol/L (ref 135–145)

## 2018-03-09 LAB — BLOOD GAS, VENOUS
Acid-Base Excess: 8.9 mmol/L — ABNORMAL HIGH (ref 0.0–2.0)
Bicarbonate: 34.7 mmol/L — ABNORMAL HIGH (ref 20.0–28.0)
Drawn by: 535471
O2 Saturation: 73.6 %
Patient temperature: 98.6
pCO2, Ven: 65.7 mmHg — ABNORMAL HIGH (ref 44.0–60.0)
pH, Ven: 7.342 (ref 7.250–7.430)
pO2, Ven: 40.3 mmHg (ref 32.0–45.0)

## 2018-03-09 LAB — HEPARIN LEVEL (UNFRACTIONATED)
Heparin Unfractionated: 0.35 IU/mL (ref 0.30–0.70)
Heparin Unfractionated: 0.35 IU/mL (ref 0.30–0.70)

## 2018-03-09 LAB — MAGNESIUM
Magnesium: 2.1 mg/dL (ref 1.7–2.4)
Magnesium: 2 mg/dL (ref 1.7–2.4)

## 2018-03-09 LAB — PHOSPHORUS
PHOSPHORUS: 3 mg/dL (ref 2.5–4.6)
Phosphorus: 3.9 mg/dL (ref 2.5–4.6)

## 2018-03-09 LAB — PROTIME-INR
INR: 0.9
Prothrombin Time: 12.1 seconds (ref 11.4–15.2)

## 2018-03-09 LAB — TRIGLYCERIDES: Triglycerides: 67 mg/dL (ref <150)

## 2018-03-09 MED ORDER — MIDAZOLAM HCL 2 MG/2ML IJ SOLN
4.0000 mg | Freq: Once | INTRAMUSCULAR | Status: AC
  Administered 2018-03-09: 4 mg via INTRAVENOUS
  Filled 2018-03-09: qty 4

## 2018-03-09 MED ORDER — DIAZEPAM 5 MG PO TABS
10.0000 mg | ORAL_TABLET | Freq: Four times a day (QID) | ORAL | Status: DC
  Administered 2018-03-09 – 2018-03-11 (×7): 10 mg via ORAL
  Filled 2018-03-09 (×7): qty 2

## 2018-03-09 MED ORDER — DEXMEDETOMIDINE HCL IN NACL 400 MCG/100ML IV SOLN
0.4000 ug/kg/h | INTRAVENOUS | Status: DC
  Administered 2018-03-09: 0.4 ug/kg/h via INTRAVENOUS
  Administered 2018-03-09: 0.8 ug/kg/h via INTRAVENOUS
  Administered 2018-03-10: 1.2 ug/kg/h via INTRAVENOUS
  Administered 2018-03-10: 0.8 ug/kg/h via INTRAVENOUS
  Administered 2018-03-10: 1.2 ug/kg/h via INTRAVENOUS
  Administered 2018-03-10: 0.891 ug/kg/h via INTRAVENOUS
  Administered 2018-03-10 (×2): 1.2 ug/kg/h via INTRAVENOUS
  Administered 2018-03-11: 1.1 ug/kg/h via INTRAVENOUS
  Administered 2018-03-11: 1.2 ug/kg/h via INTRAVENOUS
  Administered 2018-03-11: 1.1 ug/kg/h via INTRAVENOUS
  Administered 2018-03-11 (×3): 1.2 ug/kg/h via INTRAVENOUS
  Administered 2018-03-12 (×3): 1 ug/kg/h via INTRAVENOUS
  Administered 2018-03-12: 0.4 ug/kg/h via INTRAVENOUS
  Administered 2018-03-12: 1.2 ug/kg/h via INTRAVENOUS
  Administered 2018-03-13 (×2): 1 ug/kg/h via INTRAVENOUS
  Administered 2018-03-13: 1.3 ug/kg/h via INTRAVENOUS
  Administered 2018-03-13: 0.8 ug/kg/h via INTRAVENOUS
  Administered 2018-03-13: 0.6 ug/kg/h via INTRAVENOUS
  Administered 2018-03-13: 0.8 ug/kg/h via INTRAVENOUS
  Administered 2018-03-13: 1.3 ug/kg/h via INTRAVENOUS
  Administered 2018-03-15: 0.4 ug/kg/h via INTRAVENOUS
  Administered 2018-03-15: 1 ug/kg/h via INTRAVENOUS
  Administered 2018-03-15: 1.5 ug/kg/h via INTRAVENOUS
  Filled 2018-03-09 (×13): qty 100
  Filled 2018-03-09: qty 200
  Filled 2018-03-09 (×11): qty 100
  Filled 2018-03-09: qty 300
  Filled 2018-03-09 (×2): qty 100

## 2018-03-09 MED ORDER — DIAZEPAM 5 MG PO TABS
5.0000 mg | ORAL_TABLET | Freq: Four times a day (QID) | ORAL | Status: DC
  Administered 2018-03-09: 5 mg via ORAL
  Filled 2018-03-09: qty 1

## 2018-03-09 NOTE — Plan of Care (Signed)
  Problem: Nutrition: Goal: Adequate nutrition will be maintained Outcome: Progressing   

## 2018-03-09 NOTE — Plan of Care (Signed)
°  Problem: Coping: °Goal: Level of anxiety will decrease °Outcome: Progressing °  °

## 2018-03-09 NOTE — Progress Notes (Signed)
0402: I spoke with Dr. Darrick Penna in regards to patient getting more agitated throughout shift.  I shared my concern about ETOH withdraw.  Propofol can be titrated higher.  She encouraged me to titrate Propofol higher to prevent self harm/extubation. She stated if patient needs Precedex or soft wrist restraints then contact her again.

## 2018-03-09 NOTE — Progress Notes (Addendum)
ANTICOAGULATION CONSULT NOTE   Pharmacy Consult for heparin  Indication: DVT  No Known Allergies  Patient Measurements: Height: 5\' 7"  (170.2 cm) Weight: 197 lb 15.6 oz (89.8 kg) IBW/kg (Calculated) : 66.1 Heparin Dosing Weight: 85kg  Vital Signs: Temp: 98.1 F (36.7 C) (11/30 0754) Temp Source: Oral (11/30 0754) BP: 107/76 (11/30 0700) Pulse Rate: 70 (11/30 0700)  Labs: Recent Labs    02/10/2018 0612 02/18/2018 1703 02/26/2018 2216 03/09/18 0441  HGB 18.0*  --   --  15.6  HCT 57.3*  --   --  51.0  PLT 211  --   --  194  LABPROT  --   --   --  12.1  INR  --   --   --  0.90  HEPARINUNFRC  --   --  0.15* 0.35  CREATININE 0.72  --   --  0.94  CKTOTAL 138  --   --   --   TROPONINI 0.04* 0.03*  --   --     Estimated Creatinine Clearance: 89.4 mL/min (by C-G formula based on SCr of 0.94 mg/dL).   Medical History: Past Medical History:  Diagnosis Date  . Alcohol abuse    Heavy alcohol abuse and homelessness  . Arthritis   . Cardiomyopathy (HCC)   . CHF (congestive heart failure) (HCC)   . Cocaine abuse (HCC)    And crack   . COPD (chronic obstructive pulmonary disease) (HCC)   . Heart failure   . Homelessness   . Noncompliance   . Paroxysmal VT (HCC)    a. first seen 2013 in setting of normal LVEF 55-60%, EF now decreased.  . Suicide attempt (HCC)   . Tobacco abuse     Assessment: 62yom transfered from [facility] intubated and sedated for respiratory distress and hypotension.  New + DVT will start heparin.   Heparin level therapeutic at 0.35 on heparin 1450 units/hr. CBC okay. No signs/symptoms of bleeding or issues with infusion reported by nursing.  Goal of Therapy:  Heparin level 0.3-0.7 units/ml Monitor platelets by anticoagulation protocol: Yes   Plan:  Continue heparin drip at 1450 uts/hr Confirm 6hr HL to ensure heparin remains therapeutic Daily HL, CBC  Thanks for allowing pharmacy to be a part of this patient's care.   Marcelino Freestone, PharmD PGY2  Cardiology Pharmacy Resident Phone [phone] Please check AMION for all Pharmacist numbers by unit 03/09/2018 8:00 AM    Addendum: Confirmatory heparin level remains therapeutic at 0.35. Continue heparin 1450 units/hr. Check next HL tomorrow AM. Follow plans for transition to oral anticoagulation. Patient will require lead in period given DVT.  Marcelino Freestone, PharmD 03/09/2018 12:39 PM

## 2018-03-09 NOTE — Progress Notes (Signed)
NAME:  Brian [name], MRN:  [id number], DOB:  01-03-1956, LOS: 1 ADMISSION DATE:  03/15/2018, CONSULTATION DATE:  15-Mar-2018 REFERRING MD: Merrily Brittle, MD CHIEF COMPLAINT:  Acute respiratory failure   Brief History   Brian [name] is 62 y.o. M with a PMH of COPD, systolic CHF with EF 25-30%, substance use, paroxysmal VT, tobacco use and multiple prior hospitalizations resulting in patient leaving against medical advice who presented to [facility] ED on 03-15-18 for respiratory distress and ultimately intubated for hypercapnic respiratory failure.  History of present illness   Patient sedated on ventilator and unable to provide history. History obtained via chart review.  Per ED physician note, "comes to the emergency department via EMS in severe respiratory distress.  Apparently the patient has been somewhat short of breath for the past several days and has a known history of both CHF and COPD.  When EMS arrived they noted he was saturating in the low 80s with elevated respiratory rate.  He was initially unable to tolerate BiPAP however eventually was able to tolerate.  They are unable to establish IV access.  Was given 2 DuoNeb's followed by another 2 which were going in route with mild improvement in his symptoms.   Patient was hypoxic despite supplemental oxygen and could not tolerate Bipap thus was intubated. He was also started on Levophed for shock as well as Dobutamine for concern of cardiogenic shock in setting of known sCHF.  Past Medical History   Past Medical History:  Diagnosis Date  . Alcohol abuse    Heavy alcohol abuse and homelessness  . Arthritis   . Cardiomyopathy (HCC)   . CHF (congestive heart failure) (HCC)   . Cocaine abuse (HCC)    And crack   . COPD (chronic obstructive pulmonary disease) (HCC)   . Heart failure   . Homelessness   . Noncompliance   . Paroxysmal VT (HCC)    a. first seen 2013 in setting of normal LVEF 55-60%, EF now decreased.  . Suicide  attempt (HCC)   . Tobacco abuse      Significant Hospital Events   Mar 15, 2018 - Intubated in ED  Consults:  Cardiology  Procedures:  11/ 29 CVC in ED, R fem  Significant Diagnostic Tests:  11/29 LE Duplex with RLE DVT  Micro Data:  none  Antimicrobials:  Doxy 11/29 >   Interim history/subjective:  Agitated when sedation is weaned suggestive of possible withdrawal. Ventilator settings have been weaned.   Objective   Blood pressure 132/82, pulse 73, temperature 98.1 F (36.7 C), temperature source Oral, resp. rate 17, height 5\' 7"  (1.702 m), weight 89.8 kg, SpO2 97 %.    Vent Mode: PRVC FiO2 (%):  [40 %-60 %] 40 % Set Rate:  [16 bmp] 16 bmp Vt Set:  [510 mL] 510 mL PEEP:  [8 cmH20] 8 cmH20 Plateau Pressure:  [12 cmH20-18 cmH20] 12 cmH20   Intake/Output Summary (Last 24 hours) at 03/09/2018 0845 Last data filed at 03/09/2018 0800 Gross per 24 hour  Intake 2475.25 ml  Output 1850 ml  Net 625.25 ml   Filed Weights   03/15/2018 1730 03/09/18 0500  Weight: 89.8 kg 89.8 kg    Examination: General: Sedated, but intermittently follows commands HENT: ETT in place, eyes closed Lungs: Diminished sounds throughout, mechanically ventilated, no wheezing Cardiovascular: RRR, no murmur Abdomen: Soft, no distention, minimal bowel sounds Extremities: warm, palpable pulses Neuro: sedated GU: Foley in place  Resolved Hospital Problem list   None  Assessment & Plan:  Critically-ill secondary to hypoxic and hypercapnic respiratory failure requiring mechanical ventilation; likely acute on chronic given elevated HCT and HCO3 Critically-ill secondary to shock, likely distributive, improving COPD exacerbation Rhivovirus infection RLE DVT H/O CAD, sCHF w/ EF 25-30% H/O COPD H/O Alcohol abuse  Plan: - Full vent support, VAP bundle; wean as tolerate. Serial VBG. Likely transition to PSV today with hope to extubate later today - Off Dobutamine. Wean NE today, on minimal  currently. - Continue Hep gtt for RLE DVT. Will need to transition to oral anticoagulation prior to d/c. - Continue regular Duonebs with Doxy and Pred 40 mg x 5 days for COPD exacerbation - Continue home Aspirin, Atorvastatin - Propofol gtt and fentanyl injections for sedation currently. Will start schedule Valium and consider adding Precedex in planning for extubation given history of EtoH use and concern for withdrawal. - Nicotine patch for h/o tobacco abuse - Holding home Lasix, Coreg, Lisinopril currently given shock - When extubated will start EtOH withdrawal scoring  Best practice:  Diet: TF per RD Pain/Anxiety/Delirium protocol (if indicated): Ordered VAP protocol (if indicated): Ordered DVT prophylaxis: Heparin infusion for DVT GI prophylaxis: Famotidine Glucose control: n/a Mobility: bedrest, ADAT Code Status: Full Family Communication: none present Disposition: ICU  Labs   CBC: Recent Labs  Lab 2018-03-16 0612 03/09/18 0441  WBC 12.1* 15.6*  NEUTROABS 10.2*  --   HGB 18.0* 15.6  HCT 57.3* 51.0  MCV 101.1* 101.2*  PLT 211 194    Basic Metabolic Panel: Recent Labs  Lab 2018-03-16 0612 16-Mar-2018 1806 03/09/18 0441  NA 143  --  138  K 4.8 4.6 4.2  CL 101  --  98  CO2 33*  --  32  GLUCOSE 136*  --  164*  BUN 18  --  26*  CREATININE 0.72  --  0.94  CALCIUM 9.4  --  8.9  MG  --  2.1 2.1  PHOS  --  4.4 3.9   GFR: Estimated Creatinine Clearance: 89.4 mL/min (by C-G formula based on SCr of 0.94 mg/dL). Recent Labs  Lab 03-16-18 0610 2018-03-16 0612 03-16-18 1950 03/09/18 0441  WBC  --  12.1*  --  15.6*  LATICACIDVEN 0.9  --  1.5  --     Liver Function Tests: Recent Labs  Lab 03-16-18 0612  AST 46*  ALT 70*  ALKPHOS 49  BILITOT 0.7  PROT 7.7  ALBUMIN 4.3   No results for input(s): LIPASE, AMYLASE in the last 168 hours. No results for input(s): AMMONIA in the last 168 hours.  ABG    Component Value Date/Time   PHART 7.41 03-16-18 0909    PCO2ART 52 (H) 2018/03/16 0909   PO2ART 64 (L) Mar 16, 2018 0909   HCO3 35.1 (H) 2018/03/16 1528   TCO2 37 (H) Mar 16, 2018 1528   ACIDBASEDEF 1.0 12/15/2017 1614   O2SAT 59.0 03/16/2018 1528     Coagulation Profile: Recent Labs  Lab 03/09/18 0441  INR 0.90    Cardiac Enzymes: Recent Labs  Lab 03-16-2018 0612 03-16-2018 1703  CKTOTAL 138  --   TROPONINI 0.04* 0.03*    HbA1C: Hgb A1c MFr Bld  Date/Time Value Ref Range Status  02/24/2018 03:02 AM 5.2 4.8 - 5.6 % Final    Comment:    (NOTE) Pre diabetes:          5.7%-6.4% Diabetes:              >6.4% Glycemic control for   <7.0% adults  with diabetes   11/03/2017 06:51 PM 5.7 (H) 4.8 - 5.6 % Final    Comment:    (NOTE) Pre diabetes:          5.7%-6.4% Diabetes:              >6.4% Glycemic control for   <7.0% adults with diabetes     CBG: Recent Labs  Lab April 02, 2018 1548 04-02-2018 2002 April 02, 2018 2310 03/09/18 0320 03/09/18 0753  GLUCAP 201* 208* 193* 201* 160*    Review of Systems:   Unable to complete at this time due to patient status  Past Medical History  He,  has a past medical history of Alcohol abuse, Arthritis, Cardiomyopathy (HCC), CHF (congestive heart failure) (HCC), Cocaine abuse (HCC), COPD (chronic obstructive pulmonary disease) (HCC), Heart failure, Homelessness, Noncompliance, Paroxysmal VT (HCC), Suicide attempt (HCC), and Tobacco abuse.   Surgical History    Past Surgical History:  Procedure Laterality Date  . INNER EAR SURGERY    . LEFT HEART CATH AND CORONARY ANGIOGRAPHY N/A 02/25/2018   Procedure: LEFT HEART CATH AND CORONARY ANGIOGRAPHY;  Surgeon: Kathleene Hazel, MD;  Location: [facility] INVASIVE CV LAB;  Service: Cardiovascular;  Laterality: N/A;     Social History   reports that he has been smoking cigarettes. He has been smoking about 2.00 packs per day. He has never used smokeless tobacco. He reports that he drinks alcohol. He reports that he does not use drugs.   Family History     His family history includes Cancer in his sister; Coronary artery disease in his unknown relative; Diabetes in his unknown relative.   Allergies No Known Allergies   Home Medications  Prior to Admission medications   Medication Sig Start Date End Date Taking? Authorizing Provider  albuterol (PROVENTIL HFA;VENTOLIN HFA) 108 (90 Base) MCG/ACT inhaler Inhale 2 puffs into the lungs every 6 (six) hours as needed for wheezing or shortness of breath. 02/26/18   Leatha Gilding, MD  aspirin EC 81 MG EC tablet Take 1 tablet (81 mg total) by mouth daily. 02/27/18   Leatha Gilding, MD  atorvastatin (LIPITOR) 80 MG tablet Take 1 tablet (80 mg total) by mouth daily at 6 PM. 02/26/18   Leatha Gilding, MD  carvedilol (COREG) 6.25 MG tablet Take 1 tablet (6.25 mg total) by mouth 2 (two) times daily with a meal. 01/07/18 01/02/19  Marlin Canary U, DO  diclofenac sodium (VOLTAREN) 1 % GEL Apply 4 g topically 4 (four) times daily. 02/26/18   Leatha Gilding, MD  doxycycline (VIBRA-TABS) 100 MG tablet Take 1 tablet (100 mg total) by mouth 2 (two) times daily. 02/26/18   Leatha Gilding, MD  folic acid (FOLVITE) 1 MG tablet Take 1 tablet (1 mg total) by mouth daily. 01/07/18 07/06/18  Joseph Art, DO  furosemide (LASIX) 40 MG tablet Take 1 tablet (40 mg total) by mouth daily. 02/26/18 02/26/19  Leatha Gilding, MD  lisinopril (PRINIVIL,ZESTRIL) 5 MG tablet Take 2 tablets (10 mg total) by mouth daily. 02/26/18   Leatha Gilding, MD  mometasone-formoterol (DULERA) 200-5 MCG/ACT AERO Inhale 2 puffs into the lungs 2 (two) times daily. 02/26/18   Leatha Gilding, MD  nicotine (NICODERM CQ - DOSED IN MG/24 HOURS) 21 mg/24hr patch Place 1 patch (21 mg total) onto the skin daily. Patient not taking: Reported on 01/05/2018 11/08/17   [name], PA-C  predniSONE (DELTASONE) 20 MG tablet Take 2 tablets (40 mg total) by mouth  daily. 02/26/18   Leatha Gilding, MD     Critical care time: [address]     Morene Antu, MD [place] [facility]

## 2018-03-10 DIAGNOSIS — G9340 Encephalopathy, unspecified: Secondary | ICD-10-CM

## 2018-03-10 DIAGNOSIS — J9601 Acute respiratory failure with hypoxia: Secondary | ICD-10-CM

## 2018-03-10 DIAGNOSIS — R410 Disorientation, unspecified: Secondary | ICD-10-CM

## 2018-03-10 DIAGNOSIS — R579 Shock, unspecified: Secondary | ICD-10-CM

## 2018-03-10 DIAGNOSIS — J9602 Acute respiratory failure with hypercapnia: Secondary | ICD-10-CM

## 2018-03-10 LAB — HEPARIN LEVEL (UNFRACTIONATED)
Heparin Unfractionated: 0.1 IU/mL — ABNORMAL LOW (ref 0.30–0.70)
Heparin Unfractionated: 0.44 IU/mL (ref 0.30–0.70)
Heparin Unfractionated: 0.46 IU/mL (ref 0.30–0.70)

## 2018-03-10 LAB — GLUCOSE, CAPILLARY
GLUCOSE-CAPILLARY: 188 mg/dL — AB (ref 70–99)
Glucose-Capillary: 121 mg/dL — ABNORMAL HIGH (ref 70–99)
Glucose-Capillary: 126 mg/dL — ABNORMAL HIGH (ref 70–99)
Glucose-Capillary: 128 mg/dL — ABNORMAL HIGH (ref 70–99)
Glucose-Capillary: 148 mg/dL — ABNORMAL HIGH (ref 70–99)
Glucose-Capillary: 160 mg/dL — ABNORMAL HIGH (ref 70–99)

## 2018-03-10 LAB — BASIC METABOLIC PANEL
Anion gap: 11 (ref 5–15)
BUN: 26 mg/dL — ABNORMAL HIGH (ref 6–20)
CO2: 30 mmol/L (ref 22–32)
Calcium: 9.1 mg/dL (ref 8.9–10.3)
Chloride: 97 mmol/L — ABNORMAL LOW (ref 98–111)
Creatinine, Ser: 0.77 mg/dL (ref 0.61–1.24)
GFR calc Af Amer: >60 mL/min (ref >60)
GFR calc non Af Amer: >60 mL/min (ref >60)
Glucose, Bld: 147 mg/dL — ABNORMAL HIGH (ref 70–99)
Potassium: 3.9 mmol/L (ref 3.5–5.1)
Sodium: 138 mmol/L (ref 135–145)

## 2018-03-10 LAB — CBC
HCT: 50.6 % (ref 39.0–52.0)
HEMOGLOBIN: 15.3 g/dL (ref 13.0–17.0)
MCH: 30.9 pg (ref 26.0–34.0)
MCHC: 30.2 g/dL (ref 30.0–36.0)
MCV: 102.2 fL — ABNORMAL HIGH (ref 80.0–100.0)
Platelets: 177 10*3/uL (ref 150–400)
RBC: 4.95 MIL/uL (ref 4.22–5.81)
RDW: 13 % (ref 11.5–15.5)
WBC: 15.2 10*3/uL — AB (ref 4.0–10.5)
nRBC: 0 % (ref 0.0–0.2)

## 2018-03-10 LAB — MAGNESIUM
Magnesium: 1.7 mg/dL (ref 1.7–2.4)
Magnesium: 1.8 mg/dL (ref 1.7–2.4)

## 2018-03-10 LAB — PROTIME-INR
INR: 0.9
PROTHROMBIN TIME: 12 s (ref 11.4–15.2)

## 2018-03-10 LAB — PHOSPHORUS
Phosphorus: 2.8 mg/dL (ref 2.5–4.6)
Phosphorus: 3.8 mg/dL (ref 2.5–4.6)

## 2018-03-10 MED ORDER — RISPERIDONE 1 MG/ML PO SOLN
1.0000 mg | Freq: Two times a day (BID) | ORAL | Status: DC
  Administered 2018-03-10 – 2018-03-11 (×3): 1 mg via ORAL
  Filled 2018-03-10 (×4): qty 1

## 2018-03-10 MED ORDER — FUROSEMIDE 10 MG/ML IJ SOLN
40.0000 mg | Freq: Four times a day (QID) | INTRAMUSCULAR | Status: AC
  Administered 2018-03-10 (×3): 40 mg via INTRAVENOUS
  Filled 2018-03-10 (×3): qty 4

## 2018-03-10 MED ORDER — MAGNESIUM SULFATE 2 GM/50ML IV SOLN
2.0000 g | Freq: Once | INTRAVENOUS | Status: AC
  Administered 2018-03-10: 2 g via INTRAVENOUS
  Filled 2018-03-10: qty 50

## 2018-03-10 MED ORDER — POTASSIUM CHLORIDE 20 MEQ/15ML (10%) PO SOLN
40.0000 meq | Freq: Three times a day (TID) | ORAL | Status: AC
  Administered 2018-03-10 (×2): 40 meq
  Filled 2018-03-10 (×2): qty 30

## 2018-03-10 MED ORDER — ONDANSETRON HCL 4 MG/2ML IJ SOLN: 4.0000 mg | Freq: Four times a day (QID) | INTRAMUSCULAR | Status: DC | PRN

## 2018-03-10 MED ORDER — HEPARIN BOLUS VIA INFUSION
3000.0000 [IU] | Freq: Once | INTRAVENOUS | Status: AC
  Administered 2018-03-10: 3000 [IU] via INTRAVENOUS
  Filled 2018-03-10: qty 3000

## 2018-03-10 NOTE — Progress Notes (Signed)
ANTICOAGULATION CONSULT NOTE - Follow Up Consult  Pharmacy Consult for heparin Indication: DVT  Labs: Recent Labs    02/28/2018 0612 03/05/2018 1703  03/09/18 0441 03/09/18 0955 03/10/18 0346  HGB 18.0*  --   --  15.6  --  15.3  HCT 57.3*  --   --  51.0  --  50.6  PLT 211  --   --  194  --  177  LABPROT  --   --   --  12.1  --  12.0  INR  --   --   --  0.90  --  0.90  HEPARINUNFRC  --   --    < > 0.35 0.35 0.10*  CREATININE 0.72  --   --  0.94  --  0.77  CKTOTAL 138  --   --   --   --   --   TROPONINI 0.04* 0.03*  --   --   --   --    < > = values in this interval not displayed.    Assessment: 62yo male now subtherapeutic on heparin after two levels at lower end of goal; no gtt issues of signs of bleeding per RN.  Goal of Therapy:  Heparin level 0.3-0.7 units/ml   Plan:  Will rebolus with heparin 3000 units and increase heparin gtt by 3 units/kg/hr to 1700 units/hr and check level in 6 hours.    Vernard Gambles, PharmD, BCPS  03/10/2018,5:50 AM

## 2018-03-10 NOTE — Progress Notes (Signed)
ANTICOAGULATION CONSULT NOTE   Pharmacy Consult for heparin  Indication: DVT  No Known Allergies  Patient Measurements: Height: 5\' 7"  (170.2 cm) Weight: 194 lb 0.1 oz (88 kg) IBW/kg (Calculated) : 66.1 Heparin Dosing Weight: 85kg  Vital Signs: Temp: 98.9 F (37.2 C) (12/01 1604) Temp Source: Oral (12/01 1604) BP: 109/80 (12/01 2000) Pulse Rate: 77 (12/01 2000)  Labs: Recent Labs    03/21/2018 0612 March 21, 2018 1703  03/09/18 0441  03/10/18 0346 03/10/18 1204 03/10/18 1837  HGB 18.0*  --   --  15.6  --  15.3  --   --   HCT 57.3*  --   --  51.0  --  50.6  --   --   PLT 211  --   --  194  --  177  --   --   LABPROT  --   --   --  12.1  --  12.0  --   --   INR  --   --   --  0.90  --  0.90  --   --   HEPARINUNFRC  --   --    < > 0.35   < > 0.10* 0.44 0.46  CREATININE 0.72  --   --  0.94  --  0.77  --   --   CKTOTAL 138  --   --   --   --   --   --   --   TROPONINI 0.04* 0.03*  --   --   --   --   --   --    < > = values in this interval not displayed.    Estimated Creatinine Clearance: 104 mL/min (by C-G formula based on SCr of 0.77 mg/dL).  Assessment: 62yom transfered from [facility] intubated and sedated for respiratory distress and hypotension.  New + DVT will start heparin.   Heparin level remains therapeutic on heparin 1700 units/hr. No bleeding noted.  Goal of Therapy:  Heparin level 0.3-0.7 units/ml Monitor platelets by anticoagulation protocol: Yes   Plan:  Continue heparin drip at 1700 uts/hr Daily HL, CBC  Thanks for allowing pharmacy to be a part of this patient's care.  Lysle Pearl, PharmD, BCPS Please see AMION for all pharmacy numbers 03/10/2018 8:20 PM

## 2018-03-10 NOTE — Progress Notes (Signed)
ANTICOAGULATION CONSULT NOTE   Pharmacy Consult for heparin  Indication: DVT  No Known Allergies  Patient Measurements: Height: 5\' 7"  (170.2 cm) Weight: 194 lb 0.1 oz (88 kg) IBW/kg (Calculated) : 66.1 Heparin Dosing Weight: 85kg  Vital Signs: Temp: 98.9 F (37.2 C) (12/01 1200) Temp Source: Oral (12/01 1200) BP: 102/77 (12/01 1300) Pulse Rate: 72 (12/01 1300)  Labs: Recent Labs    03/07/2018 0612 02/21/2018 1703  03/09/18 0441 03/09/18 0955 03/10/18 0346 03/10/18 1204  HGB 18.0*  --   --  15.6  --  15.3  --   HCT 57.3*  --   --  51.0  --  50.6  --   PLT 211  --   --  194  --  177  --   LABPROT  --   --   --  12.1  --  12.0  --   INR  --   --   --  0.90  --  0.90  --   HEPARINUNFRC  --   --    < > 0.35 0.35 0.10* 0.44  CREATININE 0.72  --   --  0.94  --  0.77  --   CKTOTAL 138  --   --   --   --   --   --   TROPONINI 0.04* 0.03*  --   --   --   --   --    < > = values in this interval not displayed.    Estimated Creatinine Clearance: 104 mL/min (by C-G formula based on SCr of 0.77 mg/dL).   Medical History: Past Medical History:  Diagnosis Date  . Alcohol abuse    Heavy alcohol abuse and homelessness  . Arthritis   . Cardiomyopathy (HCC)   . CHF (congestive heart failure) (HCC)   . Cocaine abuse (HCC)    And crack   . COPD (chronic obstructive pulmonary disease) (HCC)   . Heart failure   . Homelessness   . Noncompliance   . Paroxysmal VT (HCC)    a. first seen 2013 in setting of normal LVEF 55-60%, EF now decreased.  . Suicide attempt (HCC)   . Tobacco abuse     Assessment: 62yom transfered from [facility] intubated and sedated for respiratory distress and hypotension.  New + DVT will start heparin.   Heparin level therapeutic at 0.44 on heparin 1700 units/hr. CBC okay. No signs/symptoms of bleeding or issues with infusion reported by nursing.  Goal of Therapy:  Heparin level 0.3-0.7 units/ml Monitor platelets by anticoagulation protocol: Yes   Plan:   Continue heparin drip at 1700 uts/hr Confirm 6hr HL to ensure heparin remains therapeutic Daily HL, CBC Follow plans for transition to oral anticoagulation. Patient will require lead in period given DVT.  Thanks for allowing pharmacy to be a part of this patient's care.   Marcelino Freestone, PharmD PGY2 Cardiology Pharmacy Resident Phone [phone] Please check AMION for all Pharmacist numbers by unit 03/10/2018 1:12 PM

## 2018-03-10 NOTE — Progress Notes (Signed)
MD Molli Knock notified of 1600 blood glucose level (188) and 1700 Mg (1.8) and K (3.8). Orders received. Norva Karvonen, RN

## 2018-03-10 NOTE — Progress Notes (Signed)
NAME:  Brian [name], MRN:  [id number], DOB:  1955/08/12, LOS: 2 ADMISSION DATE:  03/02/2018, CONSULTATION DATE:  02/08/2018 REFERRING MD: Merrily Brittle, MD CHIEF COMPLAINT:  Acute respiratory failure   Brief History   Brian [name] is 62 y.o. M with a PMH of COPD, systolic CHF with EF 25-30%, substance use, paroxysmal VT, tobacco use and multiple prior hospitalizations resulting in patient leaving against medical advice who presented to [facility] ED on 02/23/2018 for respiratory distress and ultimately intubated for hypercapnic respiratory failure.  History of present illness   Patient sedated on ventilator and unable to provide history. History obtained via chart review.  Per ED physician note, "comes to the emergency department via EMS in severe respiratory distress.  Apparently the patient has been somewhat short of breath for the past several days and has a known history of both CHF and COPD.  When EMS arrived they noted he was saturating in the low 80s with elevated respiratory rate.  He was initially unable to tolerate BiPAP however eventually was able to tolerate.  They are unable to establish IV access.  Was given 2 DuoNeb's followed by another 2 which were going in route with mild improvement in his symptoms.   Patient was hypoxic despite supplemental oxygen and could not tolerate Bipap thus was intubated. He was also started on Levophed for shock as well as Dobutamine for concern of cardiogenic shock in setting of known sCHF.  Past Medical History   Past Medical History:  Diagnosis Date  . Alcohol abuse    Heavy alcohol abuse and homelessness  . Arthritis   . Cardiomyopathy (HCC)   . CHF (congestive heart failure) (HCC)   . Cocaine abuse (HCC)    And crack   . COPD (chronic obstructive pulmonary disease) (HCC)   . Heart failure   . Homelessness   . Noncompliance   . Paroxysmal VT (HCC)    a. first seen 2013 in setting of normal LVEF 55-60%, EF now decreased.  . Suicide  attempt (HCC)   . Tobacco abuse      Significant Hospital Events   02/12/2018 - Intubated in ED  Consults:  Cardiology  Procedures:  11/ 29 CVC in ED, R fem  Significant Diagnostic Tests:  11/29 LE Duplex with RLE DVT  Micro Data:  none  Antimicrobials:  Doxy 11/29 >   Interim history/subjective:  No events overnight, agitated at times when off sedation  Objective   Blood pressure 107/88, pulse 70, temperature 98 F (36.7 C), resp. rate (!) 33, height 5\' 7"  (1.702 m), weight 88 kg, SpO2 93 %.    Vent Mode: PRVC FiO2 (%):  [40 %] 40 % Set Rate:  [16 bmp] 16 bmp Vt Set:  [510 mL] 510 mL PEEP:  [8 cmH20] 8 cmH20 Pressure Support:  [15 cmH20] 15 cmH20 Plateau Pressure:  [16 cmH20-18 cmH20] 18 cmH20   Intake/Output Summary (Last 24 hours) at 03/10/2018 0947 Last data filed at 03/10/2018 0900 Gross per 24 hour  Intake 3009.44 ml  Output 1940 ml  Net 1069.44 ml   Filed Weights   02/13/2018 1730 03/09/18 0500 03/10/18 0200  Weight: 89.8 kg 89.8 kg 88 kg    Examination: General: Sedated but once propofol is off patient becomes very agitated HENT: [place]/AT, PERRL, EOM-I and MMM Lungs: Distant BS diffusely Cardiovascular: RRR, Nl S1/S2 and -M/R/G Abdomen: Soft, NT, ND and +BS Extremities: -edema and -tenderness Neuro: Sedated but when off sedation becomes very agitated and  non-purposeful GU: Foley in place  Resolved Hospital Problem list   None  Assessment & Plan:  Critically-ill secondary to hypoxic and hypercapnic respiratory failure requiring mechanical ventilation; likely acute on chronic given elevated HCT and HCO3 Critically-ill secondary to shock, likely distributive, improving COPD exacerbation Rhivovirus infection RLE DVT H/O CAD, sCHF w/ EF 25-30% H/O COPD H/O Alcohol abuse  Plan: - Maintain on full vent support - Start Risperdal today for agitation, QT is 300 this AM but does have history of QT prolongation - Attempt to get off propofol when Risperdal  is absorbed - Wean pressors for MAP of 65 - Active diureses today - Continue Hep gtt for RLE DVT. Will need to transition to oral anticoagulation prior to d/c. - Continue regular Duonebs with Doxy and Pred 40 mg x 5 days for COPD exacerbation - Continue home Aspirin, Atorvastatin - Propofol and precedex ordered, see plan above - Nicotine patch for h/o tobacco abuse - Holding home Lasix, Coreg, Lisinopril currently given shock - When extubated will start EtOH withdrawal scoring - Thiamine and folate - Will attempt weaning in AM if able to diurese - CXR and ABG in AM - Replace K  Best practice:  Diet: TF per RD Pain/Anxiety/Delirium protocol (if indicated): Ordered VAP protocol (if indicated): Ordered DVT prophylaxis: Heparin infusion for DVT GI prophylaxis: Famotidine Glucose control: n/a Mobility: bedrest, ADAT Code Status: Full Family Communication: none present Disposition: ICU  Labs   CBC: Recent Labs  Lab 02/28/2018 0612 03/09/18 0441 03/10/18 0346  WBC 12.1* 15.6* 15.2*  NEUTROABS 10.2*  --   --   HGB 18.0* 15.6 15.3  HCT 57.3* 51.0 50.6  MCV 101.1* 101.2* 102.2*  PLT 211 194 177    Basic Metabolic Panel: Recent Labs  Lab 02/24/2018 0612 02/25/2018 1806 03/09/18 0441 03/09/18 1525 03/10/18 0346  NA 143  --  138  --  138  K 4.8 4.6 4.2  --  3.9  CL 101  --  98  --  97*  CO2 33*  --  32  --  30  GLUCOSE 136*  --  164*  --  147*  BUN 18  --  26*  --  26*  CREATININE 0.72  --  0.94  --  0.77  CALCIUM 9.4  --  8.9  --  9.1  MG  --  2.1 2.1 2.0 1.7  PHOS  --  4.4 3.9 3.0 2.8   GFR: Estimated Creatinine Clearance: 104 mL/min (by C-G formula based on SCr of 0.77 mg/dL). Recent Labs  Lab 03/03/2018 0610 02/09/2018 0612 02/20/2018 1950 03/09/18 0441 03/10/18 0346  WBC  --  12.1*  --  15.6* 15.2*  LATICACIDVEN 0.9  --  1.5  --   --     Liver Function Tests: Recent Labs  Lab 02/12/2018 0612  AST 46*  ALT 70*  ALKPHOS 49  BILITOT 0.7  PROT 7.7  ALBUMIN 4.3    No results for input(s): LIPASE, AMYLASE in the last 168 hours. No results for input(s): AMMONIA in the last 168 hours.  ABG    Component Value Date/Time   PHART 7.41 02/11/2018 0909   PCO2ART 52 (H) 02/21/2018 0909   PO2ART 64 (L) 03/05/2018 0909   HCO3 34.7 (H) 03/09/2018 0950   TCO2 37 (H) 02/25/2018 1528   ACIDBASEDEF 1.0 12/15/2017 1614   O2SAT 73.6 03/09/2018 0950     Coagulation Profile: Recent Labs  Lab 03/09/18 0441 03/10/18 0346  INR 0.90 0.90  Cardiac Enzymes: Recent Labs  Lab March 31, 2018 0612 2018-03-31 1703  CKTOTAL 138  --   TROPONINI 0.04* 0.03*    HbA1C: Hgb A1c MFr Bld  Date/Time Value Ref Range Status  02/24/2018 03:02 AM 5.2 4.8 - 5.6 % Final    Comment:    (NOTE) Pre diabetes:          5.7%-6.4% Diabetes:              >6.4% Glycemic control for   <7.0% adults with diabetes   11/03/2017 06:51 PM 5.7 (H) 4.8 - 5.6 % Final    Comment:    (NOTE) Pre diabetes:          5.7%-6.4% Diabetes:              >6.4% Glycemic control for   <7.0% adults with diabetes     CBG: Recent Labs  Lab 03/09/18 1538 03/09/18 1958 03/09/18 2343 03/10/18 0359 03/10/18 0755  GLUCAP 151* 150* 143* 128* 121*    Review of Systems:   Unable to complete at this time due to patient status  Past Medical History  He,  has a past medical history of Alcohol abuse, Arthritis, Cardiomyopathy (HCC), CHF (congestive heart failure) (HCC), Cocaine abuse (HCC), COPD (chronic obstructive pulmonary disease) (HCC), Heart failure, Homelessness, Noncompliance, Paroxysmal VT (HCC), Suicide attempt (HCC), and Tobacco abuse.   Surgical History    Past Surgical History:  Procedure Laterality Date  . INNER EAR SURGERY    . LEFT HEART CATH AND CORONARY ANGIOGRAPHY N/A 02/25/2018   Procedure: LEFT HEART CATH AND CORONARY ANGIOGRAPHY;  Surgeon: Kathleene Hazel, MD;  Location: [facility] INVASIVE CV LAB;  Service: Cardiovascular;  Laterality: N/A;     Social History   reports  that he has been smoking cigarettes. He has been smoking about 2.00 packs per day. He has never used smokeless tobacco. He reports that he drinks alcohol. He reports that he does not use drugs.   Family History   His family history includes Cancer in his sister; Coronary artery disease in his unknown relative; Diabetes in his unknown relative.   Allergies No Known Allergies   Home Medications  Prior to Admission medications   Medication Sig Start Date End Date Taking? Authorizing Provider  albuterol (PROVENTIL HFA;VENTOLIN HFA) 108 (90 Base) MCG/ACT inhaler Inhale 2 puffs into the lungs every 6 (six) hours as needed for wheezing or shortness of breath. 02/26/18   Leatha Gilding, MD  aspirin EC 81 MG EC tablet Take 1 tablet (81 mg total) by mouth daily. 02/27/18   Leatha Gilding, MD  atorvastatin (LIPITOR) 80 MG tablet Take 1 tablet (80 mg total) by mouth daily at 6 PM. 02/26/18   Leatha Gilding, MD  carvedilol (COREG) 6.25 MG tablet Take 1 tablet (6.25 mg total) by mouth 2 (two) times daily with a meal. 01/07/18 01/02/19  Marlin Canary U, DO  diclofenac sodium (VOLTAREN) 1 % GEL Apply 4 g topically 4 (four) times daily. 02/26/18   Leatha Gilding, MD  doxycycline (VIBRA-TABS) 100 MG tablet Take 1 tablet (100 mg total) by mouth 2 (two) times daily. 02/26/18   Leatha Gilding, MD  folic acid (FOLVITE) 1 MG tablet Take 1 tablet (1 mg total) by mouth daily. 01/07/18 07/06/18  Joseph Art, DO  furosemide (LASIX) 40 MG tablet Take 1 tablet (40 mg total) by mouth daily. 02/26/18 02/26/19  Leatha Gilding, MD  lisinopril (PRINIVIL,ZESTRIL) 5 MG tablet Take 2 tablets (  10 mg total) by mouth daily. 02/26/18   Leatha Gilding, MD  mometasone-formoterol (DULERA) 200-5 MCG/ACT AERO Inhale 2 puffs into the lungs 2 (two) times daily. 02/26/18   Leatha Gilding, MD  nicotine (NICODERM CQ - DOSED IN MG/24 HOURS) 21 mg/24hr patch Place 1 patch (21 mg total) onto the skin daily. Patient not  taking: Reported on 01/05/2018 11/08/17   [name], PA-C  predniSONE (DELTASONE) 20 MG tablet Take 2 tablets (40 mg total) by mouth daily. 02/26/18   Leatha Gilding, MD

## 2018-03-10 DEATH — deceased

## 2018-03-11 ENCOUNTER — Inpatient Hospital Stay (HOSPITAL_COMMUNITY): Payer: Medicaid Other

## 2018-03-11 LAB — BASIC METABOLIC PANEL
Anion gap: 5 (ref 5–15)
BUN: 26 mg/dL — ABNORMAL HIGH (ref 6–20)
CO2: 39 mmol/L — ABNORMAL HIGH (ref 22–32)
Calcium: 9.4 mg/dL (ref 8.9–10.3)
Chloride: 97 mmol/L — ABNORMAL LOW (ref 98–111)
Creatinine, Ser: 1.05 mg/dL (ref 0.61–1.24)
Glucose, Bld: 118 mg/dL — ABNORMAL HIGH (ref 70–99)
Potassium: 4.3 mmol/L (ref 3.5–5.1)
Sodium: 141 mmol/L (ref 135–145)

## 2018-03-11 LAB — HEPARIN LEVEL (UNFRACTIONATED): Heparin Unfractionated: 0.48 IU/mL (ref 0.30–0.70)

## 2018-03-11 LAB — CBC
HCT: 51.2 % (ref 39.0–52.0)
Hemoglobin: 15.5 g/dL (ref 13.0–17.0)
MCH: 30.9 pg (ref 26.0–34.0)
MCHC: 30.3 g/dL (ref 30.0–36.0)
MCV: 102 fL — ABNORMAL HIGH (ref 80.0–100.0)
Platelets: 183 10*3/uL (ref 150–400)
RBC: 5.02 MIL/uL (ref 4.22–5.81)
RDW: 13.2 % (ref 11.5–15.5)
WBC: 11.8 10*3/uL — AB (ref 4.0–10.5)
nRBC: 0 % (ref 0.0–0.2)

## 2018-03-11 LAB — POCT I-STAT 3, ART BLOOD GAS (G3+)
ACID-BASE EXCESS: 12 mmol/L — AB (ref 0.0–2.0)
Bicarbonate: 37.2 mmol/L — ABNORMAL HIGH (ref 20.0–28.0)
O2 Saturation: 93 %
TCO2: 39 mmol/L — ABNORMAL HIGH (ref 22–32)
pCO2 arterial: 48.9 mmHg — ABNORMAL HIGH (ref 32.0–48.0)
pH, Arterial: 7.489 — ABNORMAL HIGH (ref 7.350–7.450)
pO2, Arterial: 63 mmHg — ABNORMAL LOW (ref 83.0–108.0)

## 2018-03-11 LAB — GLUCOSE, CAPILLARY
GLUCOSE-CAPILLARY: 103 mg/dL — AB (ref 70–99)
GLUCOSE-CAPILLARY: 122 mg/dL — AB (ref 70–99)
Glucose-Capillary: 111 mg/dL — ABNORMAL HIGH (ref 70–99)
Glucose-Capillary: 112 mg/dL — ABNORMAL HIGH (ref 70–99)
Glucose-Capillary: 113 mg/dL — ABNORMAL HIGH (ref 70–99)
Glucose-Capillary: 131 mg/dL — ABNORMAL HIGH (ref 70–99)
Glucose-Capillary: 173 mg/dL — ABNORMAL HIGH (ref 70–99)

## 2018-03-11 LAB — PROTIME-INR
INR: 0.97
Prothrombin Time: 12.8 seconds (ref 11.4–15.2)

## 2018-03-11 LAB — MAGNESIUM: Magnesium: 2.3 mg/dL (ref 1.7–2.4)

## 2018-03-11 LAB — TRIGLYCERIDES: Triglycerides: 118 mg/dL (ref <150)

## 2018-03-11 LAB — PHOSPHORUS: Phosphorus: 3.6 mg/dL (ref 2.5–4.6)

## 2018-03-11 LAB — BRAIN NATRIURETIC PEPTIDE: B Natriuretic Peptide: 110.6 pg/mL — ABNORMAL HIGH (ref 0.0–100.0)

## 2018-03-11 MED ORDER — ASPIRIN 81 MG PO CHEW
81.0000 mg | CHEWABLE_TABLET | Freq: Every day | ORAL | Status: DC
  Administered 2018-03-11 – 2018-04-06 (×27): 81 mg
  Filled 2018-03-11 (×26): qty 1

## 2018-03-11 MED ORDER — RISPERIDONE 1 MG/ML PO SOLN
1.0000 mg | Freq: Two times a day (BID) | ORAL | Status: DC
  Administered 2018-03-11 – 2018-03-14 (×7): 1 mg
  Filled 2018-03-11 (×9): qty 1

## 2018-03-11 MED ORDER — METOCLOPRAMIDE HCL 5 MG/ML IJ SOLN
10.0000 mg | Freq: Four times a day (QID) | INTRAMUSCULAR | Status: AC
  Administered 2018-03-11 – 2018-03-12 (×4): 10 mg via INTRAVENOUS
  Filled 2018-03-11 (×4): qty 2

## 2018-03-11 MED ORDER — ATORVASTATIN CALCIUM 80 MG PO TABS
80.0000 mg | ORAL_TABLET | Freq: Every day | ORAL | Status: DC
  Administered 2018-03-11 – 2018-04-05 (×22): 80 mg
  Filled 2018-03-11 (×24): qty 1

## 2018-03-11 MED ORDER — SODIUM CHLORIDE 0.9 % IV BOLUS
500.0000 mL | Freq: Once | INTRAVENOUS | Status: AC
  Administered 2018-03-11: 500 mL via INTRAVENOUS

## 2018-03-11 MED ORDER — FAMOTIDINE 40 MG/5ML PO SUSR
20.0000 mg | Freq: Two times a day (BID) | ORAL | Status: DC
  Administered 2018-03-11 – 2018-03-12 (×3): 20 mg
  Filled 2018-03-11 (×2): qty 2.5

## 2018-03-11 MED ORDER — SODIUM CHLORIDE 0.9 % IV SOLN
2.0000 g | INTRAVENOUS | Status: DC
  Administered 2018-03-11 – 2018-03-20 (×10): 2 g via INTRAVENOUS
  Filled 2018-03-11 (×12): qty 20

## 2018-03-11 MED ORDER — SODIUM CHLORIDE 0.9% FLUSH: 10.0000 mL | INTRAVENOUS | Status: DC | PRN

## 2018-03-11 MED ORDER — CHLORHEXIDINE GLUCONATE CLOTH 2 % EX PADS
6.0000 | MEDICATED_PAD | Freq: Every day | CUTANEOUS | Status: DC
  Administered 2018-03-11: 6 via TOPICAL

## 2018-03-11 MED ORDER — SODIUM CHLORIDE 0.9% FLUSH
10.0000 mL | Freq: Two times a day (BID) | INTRAVENOUS | Status: DC
  Administered 2018-03-11 – 2018-03-13 (×3): 10 mL

## 2018-03-11 MED ORDER — DIAZEPAM 5 MG PO TABS
10.0000 mg | ORAL_TABLET | Freq: Four times a day (QID) | ORAL | Status: DC
  Administered 2018-03-11 – 2018-03-12 (×5): 10 mg
  Filled 2018-03-11 (×5): qty 2

## 2018-03-11 MED FILL — Perflutren Lipid Microsphere IV Susp 1.1 MG/ML: INTRAVENOUS | Qty: 10 | Status: AC

## 2018-03-11 NOTE — Progress Notes (Signed)
ANTICOAGULATION CONSULT NOTE   Pharmacy Consult for heparin  Indication: DVT  No Known Allergies  Patient Measurements: Height: 5\' 7"  (170.2 cm) Weight: 186 lb 8.2 oz (84.6 kg) IBW/kg (Calculated) : 66.1 Heparin Dosing Weight: 85kg  Vital Signs: Temp: 99.9 F (37.7 C) (12/02 0802) Temp Source: Oral (12/02 0802) BP: 92/71 (12/02 1103) Pulse Rate: 71 (12/02 1103)  Labs: Recent Labs    02/09/2018 1703  03/09/18 0441  03/10/18 0346 03/10/18 1204 03/10/18 1837 03/11/18 0406  HGB  --    < > 15.6  --  15.3  --   --  15.5  HCT  --   --  51.0  --  50.6  --   --  51.2  PLT  --   --  194  --  177  --   --  183  LABPROT  --   --  12.1  --  12.0  --   --  12.8  INR  --   --  0.90  --  0.90  --   --  0.97  HEPARINUNFRC  --    < > 0.35   < > 0.10* 0.44 0.46 0.48  CREATININE  --   --  0.94  --  0.77  --   --  1.05  TROPONINI 0.03*  --   --   --   --   --   --   --    < > = values in this interval not displayed.    Estimated Creatinine Clearance: 77.8 mL/min (by C-G formula based on SCr of 1.05 mg/dL).  Assessment: 62yom transfered from [facility] intubated and sedated for respiratory distress and hypotension.  New + DVT will start heparin.   Heparin level continues to be therapeutic on heparin 1700 units/hr. No bleeding noted. CBC stable.   Goal of Therapy:  Heparin level 0.3-0.7 units/ml Monitor platelets by anticoagulation protocol: Yes   Plan:  Continue heparin drip at 1700 uts/hr Daily HL, CBC  Thanks for allowing pharmacy to be a part of this patient's care.  Sheppard Coil PharmD., BCPS Clinical Pharmacist 03/11/2018 11:30 AM

## 2018-03-11 NOTE — Progress Notes (Signed)
NAME:  Brian [name], MRN:  [id number], DOB:  07-May-1955, LOS: 3 ADMISSION DATE:  2018/03/14, CONSULTATION DATE:  Mar 14, 2018 REFERRING MD:  Lamont Snowball - ED, CHIEF COMPLAINT:  Acute respiratory failure.   HPI/course in hospital  62 year old man who was intubated and transferred from [facility] for hypercapneic respiratory failure.   Past Medical History  He,  has a past medical history of Alcohol abuse, Arthritis, Cardiomyopathy (HCC), CHF (congestive heart failure) (HCC) with EF 25%. ,Cocaine abuse (HCC), COPD (chronic obstructive pulmonary disease) (HCC), Heart failure, Homelessness, Noncompliance with multiple trips to hospital before leaving AMA  , Paroxysmal VT (HCC), Suicide attempt (HCC), and Tobacco abuse.    Interim history/subjective:  On SBT , agitation periodically   Objective   Blood pressure 92/71, pulse 71, temperature 99.9 F (37.7 C), temperature source Oral, resp. rate (!) 24, height 5\' 7"  (1.702 m), weight 84.6 kg, SpO2 98 %.    Vent Mode: PSV;CPAP FiO2 (%):  [40 %-50 %] 50 % Set Rate:  [16 bmp] 16 bmp Vt Set:  [510 mL] 510 mL PEEP:  [8 cmH20] 8 cmH20 Pressure Support:  [12 cmH20] 12 cmH20 Plateau Pressure:  [17 cmH20-19 cmH20] 19 cmH20   Intake/Output Summary (Last 24 hours) at 03/11/2018 1140 Last data filed at 03/11/2018 1100 Gross per 24 hour  Intake 2522.07 ml  Output 4170 ml  Net -1647.93 ml   Filed Weights   03/09/18 0500 03/10/18 0200 03/11/18 0500  Weight: 89.8 kg 88 kg 84.6 kg    Examination: Physical Exam  Constitutional: He appears well-developed and well-nourished. He appears listless. He appears distressed. He is intubated.  HENT:  Head: Normocephalic.  Eyes: Pupils are equal, round, and reactive to light. Conjunctivae are normal.  Cardiovascular: Regular rhythm and normal heart sounds. Tachycardia present.  Extremities warm   Respiratory: Accessory muscle usage present. Tachypnea noted. He is intubated. He is in respiratory distress. He  has wheezes in the right upper field, the right middle field, the left upper field and the left middle field.  Distressed and not tolerating PSV 12/8  GI: Soft. There is no tenderness.  Genitourinary:  Genitourinary Comments: Catheter in place.  Neurological: He appears listless.  Skin: Skin is warm.  Line sites intact     Ancillary tests (personally reviewed)  CBC: Recent Labs  Lab Mar 14, 2018 0612 03/09/18 0441 03/10/18 0346 03/11/18 0406  WBC 12.1* 15.6* 15.2* 11.8*  NEUTROABS 10.2*  --   --   --   HGB 18.0* 15.6 15.3 15.5  HCT 57.3* 51.0 50.6 51.2  MCV 101.1* 101.2* 102.2* 102.0*  PLT 211 194 177 183    Basic Metabolic Panel: Recent Labs  Lab 03/14/2018 0612  14-Mar-2018 1806 03/09/18 0441 03/09/18 1525 03/10/18 0346 03/10/18 1712 03/11/18 0406  NA 143  --   --  138  --  138  --  141  K 4.8  --  4.6 4.2  --  3.9  --  4.3  CL 101  --   --  98  --  97*  --  97*  CO2 33*  --   --  32  --  30  --  39*  GLUCOSE 136*  --   --  164*  --  147*  --  118*  BUN 18  --   --  26*  --  26*  --  26*  CREATININE 0.72  --   --  0.94  --  0.77  --  1.05  CALCIUM 9.4  --   --  8.9  --  9.1  --  9.4  MG  --    < > 2.1 2.1 2.0 1.7 1.8 2.3  PHOS  --    < > 4.4 3.9 3.0 2.8 3.8 3.6   < > = values in this interval not displayed.   GFR: Estimated Creatinine Clearance: 77.8 mL/min (by C-G formula based on SCr of 1.05 mg/dL). Recent Labs  Lab 02/24/2018 0610 02/17/2018 0612 03/02/2018 1950 03/09/18 0441 03/10/18 0346 03/11/18 0406  WBC  --  12.1*  --  15.6* 15.2* 11.8*  LATICACIDVEN 0.9  --  1.5  --   --   --     Liver Function Tests: Recent Labs  Lab 03/05/2018 0612  AST 46*  ALT 70*  ALKPHOS 49  BILITOT 0.7  PROT 7.7  ALBUMIN 4.3   No results for input(s): LIPASE, AMYLASE in the last 168 hours. No results for input(s): AMMONIA in the last 168 hours.  ABG - compensated repiratory acidosis.     Component Value Date/Time   PHART 7.489 (H) 03/11/2018 0322   PCO2ART 48.9 (H)  03/11/2018 0322   PO2ART 63.0 (L) 03/11/2018 0322   HCO3 37.2 (H) 03/11/2018 0322   TCO2 39 (H) 03/11/2018 0322   ACIDBASEDEF 1.0 12/15/2017 1614   O2SAT 93.0 03/11/2018 0322      CBG: Recent Labs  Lab 03/10/18 1606 03/10/18 1952 03/10/18 2330 03/11/18 0416 03/11/18 0906  GLUCAP 188* 160* 148* 111* 103*   Microbiology:  + Rhinovirus. - MRSA  CXR 12/2: hyperinflation with no clear infiltrate or signs of pulmonary edema.  Assessment & Plan:  Critically ill due to acute respiratory failure requiring mechanical ventilation Cause of respiratory failure appears to be AECOPD +/- superimposed LRTI - viral panel + for rhinovirus Possible HF component as BNP has been climbing, although examination not suggestive.  Nicotine addiction Critically ill due to agitated delirium requiring titration of sedative infusions.  PLAN: Continue long protective ventilatory support.  Daily SBT  Chest physiotherapy Continue bronchodilators Empiric CAP coverage pending cultures. Repeat BNP and diurese if elevated  Continue NRT Continue current sedation strategy.  Best practice:  Diet: Tube feeds on hold - restart with Reglan Pain/Anxiety/Delirium protocol (if indicated): propofol/dexmedetomidine and fentanyl prn VAP protocol (if indicated): bundle  DVT prophylaxis: IV heparin for AF GI prophylaxis: famotidine Glucose control: euglycemic with no coverage. Mobility: Bed rest Code Status: Full - will need to discuss in light of patient's non-compliance. Family Communication: No family present. Disposition: ICU   Critical care time: .     Lynnell Catalan, MD [facility] ICU Physician [facility]  Pager: [phone] Mobile: [phone] After hours: [phone].

## 2018-03-11 NOTE — Progress Notes (Signed)
Patient gradually desatting, lowest SpO2 88, despite suction, repositioning, and increasing sedation/administering fentanyl. RT called and RT changed vent settings (O2 increased to 50%).

## 2018-03-12 LAB — CBC
HCT: 52.9 % — ABNORMAL HIGH (ref 39.0–52.0)
Hemoglobin: 15.8 g/dL (ref 13.0–17.0)
MCH: 30.7 pg (ref 26.0–34.0)
MCHC: 29.9 g/dL — ABNORMAL LOW (ref 30.0–36.0)
MCV: 102.7 fL — ABNORMAL HIGH (ref 80.0–100.0)
Platelets: 137 10*3/uL — ABNORMAL LOW (ref 150–400)
RBC: 5.15 MIL/uL (ref 4.22–5.81)
RDW: 13.1 % (ref 11.5–15.5)
WBC: 9.9 10*3/uL (ref 4.0–10.5)
nRBC: 0 % (ref 0.0–0.2)

## 2018-03-12 LAB — PROTIME-INR
INR: 0.95
PROTHROMBIN TIME: 12.6 s (ref 11.4–15.2)

## 2018-03-12 LAB — HEPARIN LEVEL (UNFRACTIONATED)
Heparin Unfractionated: <0.1 IU/mL — ABNORMAL LOW (ref 0.30–0.70)
Heparin Unfractionated: 0.1 IU/mL — ABNORMAL LOW (ref 0.30–0.70)

## 2018-03-12 LAB — GLUCOSE, CAPILLARY
GLUCOSE-CAPILLARY: 113 mg/dL — AB (ref 70–99)
Glucose-Capillary: 119 mg/dL — ABNORMAL HIGH (ref 70–99)
Glucose-Capillary: 135 mg/dL — ABNORMAL HIGH (ref 70–99)
Glucose-Capillary: 121 mg/dL — ABNORMAL HIGH (ref 70–99)
Glucose-Capillary: 126 mg/dL — ABNORMAL HIGH (ref 70–99)

## 2018-03-12 LAB — BASIC METABOLIC PANEL
Anion gap: 14 (ref 5–15)
BUN: 30 mg/dL — ABNORMAL HIGH (ref 6–20)
CALCIUM: 9.7 mg/dL (ref 8.9–10.3)
CO2: 28 mmol/L (ref 22–32)
Chloride: 99 mmol/L (ref 98–111)
Creatinine, Ser: 1.03 mg/dL (ref 0.61–1.24)
GFR calc non Af Amer: >60 mL/min (ref >60)
Glucose, Bld: 116 mg/dL — ABNORMAL HIGH (ref 70–99)
Potassium: 4.6 mmol/L (ref 3.5–5.1)
Sodium: 141 mmol/L (ref 135–145)

## 2018-03-12 LAB — MAGNESIUM: Magnesium: 2 mg/dL (ref 1.7–2.4)

## 2018-03-12 MED ORDER — HALOPERIDOL LACTATE 5 MG/ML IJ SOLN
INTRAMUSCULAR | Status: AC
  Filled 2018-03-12: qty 1

## 2018-03-12 MED ORDER — ENOXAPARIN SODIUM 80 MG/0.8ML ~~LOC~~ SOLN
80.0000 mg | Freq: Two times a day (BID) | SUBCUTANEOUS | Status: DC
  Filled 2018-03-12: qty 0.8

## 2018-03-12 MED ORDER — ENOXAPARIN SODIUM 80 MG/0.8ML ~~LOC~~ SOLN
80.0000 mg | Freq: Two times a day (BID) | SUBCUTANEOUS | Status: DC
  Administered 2018-03-13 – 2018-03-20 (×16): 80 mg via SUBCUTANEOUS
  Filled 2018-03-12 (×18): qty 0.8

## 2018-03-12 MED ORDER — ORAL CARE MOUTH RINSE
15.0000 mL | Freq: Two times a day (BID) | OROMUCOSAL | Status: DC
  Administered 2018-03-12 – 2018-03-13 (×3): 15 mL via OROMUCOSAL

## 2018-03-12 MED ORDER — BOOST / RESOURCE BREEZE PO LIQD CUSTOM: 1.0000 | Freq: Three times a day (TID) | ORAL | Status: DC

## 2018-03-12 MED ORDER — ENOXAPARIN SODIUM 100 MG/ML ~~LOC~~ SOLN
1.0000 mg/kg | Freq: Two times a day (BID) | SUBCUTANEOUS | Status: DC
  Filled 2018-03-12: qty 0.85

## 2018-03-12 MED ORDER — DIAZEPAM 5 MG PO TABS
10.0000 mg | ORAL_TABLET | Freq: Four times a day (QID) | ORAL | Status: DC | PRN
  Administered 2018-03-13 – 2018-03-14 (×3): 10 mg via ORAL
  Filled 2018-03-12 (×3): qty 2

## 2018-03-12 MED ORDER — NICOTINE 21 MG/24HR TD PT24: 21.0000 mg | MEDICATED_PATCH | Freq: Every day | TRANSDERMAL | Status: DC

## 2018-03-12 MED ORDER — ENOXAPARIN SODIUM 80 MG/0.8ML ~~LOC~~ SOLN
80.0000 mg | Freq: Once | SUBCUTANEOUS | Status: AC
  Administered 2018-03-12: 80 mg via SUBCUTANEOUS
  Filled 2018-03-12 (×2): qty 0.8

## 2018-03-12 MED ORDER — HALOPERIDOL LACTATE 5 MG/ML IJ SOLN
5.0000 mg | Freq: Once | INTRAMUSCULAR | Status: AC
  Administered 2018-03-12: 5 mg via INTRAVENOUS

## 2018-03-12 NOTE — Progress Notes (Signed)
Nutrition Follow-up  INTERVENTION:   - Boost Breeze po TID, each supplement provides 250 kcal and 9 grams of protein  - Will follow for further diet advancement and supplement as appropriate  - Will d/c tube feeding orders  NUTRITION DIAGNOSIS:   Inadequate oral intake related to inability to eat as evidenced by NPO status.  Progressing, pt now on CLD  GOAL:   Patient will meet greater than or equal to 90% of their needs  Progressing  MONITOR:   Vent status, Labs, I & O's, Weight trends, TF tolerance  REASON FOR ASSESSMENT:   Ventilator, Consult Enteral/tube feeding initiation and management  ASSESSMENT:   62 year old male who presented to the [facility] ED on 11/29 with acute respiratory failure. Pt was intubated in the ED. PMH significant for COPD, CHF, substance use, tobacco use, and multiple prior hospitalizations with pt leaving AMA.  12/1 - pt with episode of emesis, TF held 12/3 - extubated, diet advanced to clear liquids at 1400  Discussed pt with RN. Pt extubated shortly after RD visit.  Noted pt with 12 lb weight loss since admission. Suspect this is related to fluid status given CHF diagnosis. Will continue to monitor weight trends during admission.  Plan is for pt to be advanced to clear liquids this afternoon and for RN to advance diet as tolerated. Will order Boost Breeze oral nutrition supplement and monitor for diet advancement.  Medications reviewed and include: Pepcid, folic acid, thiamine, IV antibiotics, Precedex, heparin  Labs reviewed: BUN 30 (H) CBG's: 135, 119, 131, 113, 112, 122 x 24 hours  UOP: 1360 ml x 24 hours  Diet Order:   Diet Order            Diet clear liquid Room service appropriate? Yes; Fluid consistency: Thin  Diet effective 1400              EDUCATION NEEDS:   Not appropriate for education at this time  Skin:  Skin Assessment: Reviewed RN Assessment  Last BM:  PTA/unknown  Height:   Ht Readings from Last 1  Encounters:  02/10/2018 5\' 7"  (1.702 m)    Weight:   Wt Readings from Last 1 Encounters:  03/12/18 84.1 kg    Ideal Body Weight:  67.3 kg  BMI:  Body mass index is 29.04 kg/m.  Estimated Nutritional Needs:   Kcal:  2000-2200  Protein:  100-115 grams  Fluid:  >/= 1.7 L    Earma Reading, MS, RD, LDN Inpatient Clinical Dietitian Pager: [phone] Weekend/After Hours: [phone]

## 2018-03-12 NOTE — Progress Notes (Signed)
NAME:  Brian [name], MRN:  [id number], DOB:  10-Jul-1955, LOS: 4 ADMISSION DATE:  Apr 06, 2018, CONSULTATION DATE:  06-Apr-2018 REFERRING MD:  Lamont Snowball - ED, CHIEF COMPLAINT:  Acute respiratory failure.   HPI/course in hospital  62 year old man who was intubated and transferred from [facility] for hypercapneic respiratory failure.   Past Medical History  He,  has a past medical history of Alcohol abuse, Arthritis, Cardiomyopathy (HCC), CHF (congestive heart failure) (HCC) with EF 25%. ,Cocaine abuse (HCC), COPD (chronic obstructive pulmonary disease) (HCC), Heart failure, Homelessness, Noncompliance with multiple trips to hospital before leaving AMA  , Paroxysmal VT (HCC), Suicide attempt (HCC), and Tobacco abuse.    Interim history/subjective:  Periodic agitation overnight.   Objective   Blood pressure 122/80, pulse 98, temperature (!) 97.5 F (36.4 C), temperature source Oral, resp. rate (!) 21, height 5\' 7"  (1.702 m), weight 84.1 kg, SpO2 94 %.    Vent Mode: PSV;CPAP FiO2 (%):  [40 %-50 %] 40 % Set Rate:  [16 bmp] 16 bmp Vt Set:  [510 mL] 510 mL PEEP:  [5 cmH20-8 cmH20] 5 cmH20 Pressure Support:  [7 cmH20-10 cmH20] 7 cmH20 Plateau Pressure:  [11 cmH20-17 cmH20] 15 cmH20   Intake/Output Summary (Last 24 hours) at 03/12/2018 1441 Last data filed at 03/12/2018 1410 Gross per 24 hour  Intake 1381.5 ml  Output 1360 ml  Net 21.5 ml   Filed Weights   03/10/18 0200 03/11/18 0500 03/12/18 0419  Weight: 88 kg 84.6 kg 84.1 kg    Examination: Physical Exam  Constitutional: He appears well-developed and well-nourished. He appears listless. He appears distressed. He is intubated.  HENT:  Head: Normocephalic.  Eyes: Pupils are equal, round, and reactive to light. Conjunctivae are normal.  Cardiovascular: Regular rhythm and normal heart sounds. Tachycardia present.  Extremities warm   Respiratory: Accessory muscle usage present. Tachypnea noted. He is intubated. No respiratory  distress. He has no wheezes.  Distressed and not tolerating PSV 12/8  GI: Soft. There is no tenderness.  Genitourinary:  Genitourinary Comments: Catheter in place.  Neurological: He appears listless.  Skin: Skin is warm.  Line sites intact     Ancillary tests (personally reviewed)  CBC: Recent Labs  Lab April 06, 2018 0612 03/09/18 0441 03/10/18 0346 03/11/18 0406 03/12/18 0306  WBC 12.1* 15.6* 15.2* 11.8* 9.9  NEUTROABS 10.2*  --   --   --   --   HGB 18.0* 15.6 15.3 15.5 15.8  HCT 57.3* 51.0 50.6 51.2 52.9*  MCV 101.1* 101.2* 102.2* 102.0* 102.7*  PLT 211 194 177 183 137*    Basic Metabolic Panel: Recent Labs  Lab 2018-04-06 0612  04/06/2018 1806 03/09/18 0441 03/09/18 1525 03/10/18 0346 03/10/18 1712 03/11/18 0406 03/12/18 0306  NA 143  --   --  138  --  138  --  141 141  K 4.8  --  4.6 4.2  --  3.9  --  4.3 4.6  CL 101  --   --  98  --  97*  --  97* 99  CO2 33*  --   --  32  --  30  --  39* 28  GLUCOSE 136*  --   --  164*  --  147*  --  118* 116*  BUN 18  --   --  26*  --  26*  --  26* 30*  CREATININE 0.72  --   --  0.94  --  0.77  --  1.05  1.03  CALCIUM 9.4  --   --  8.9  --  9.1  --  9.4 9.7  MG  --    < > 2.1 2.1 2.0 1.7 1.8 2.3 2.0  PHOS  --    < > 4.4 3.9 3.0 2.8 3.8 3.6  --    < > = values in this interval not displayed.   GFR: Estimated Creatinine Clearance: 79.1 mL/min (by C-G formula based on SCr of 1.03 mg/dL). Recent Labs  Lab 02/10/2018 0610  03/02/2018 1950 03/09/18 0441 03/10/18 0346 03/11/18 0406 03/12/18 0306  WBC  --    < >  --  15.6* 15.2* 11.8* 9.9  LATICACIDVEN 0.9  --  1.5  --   --   --   --    < > = values in this interval not displayed.    Liver Function Tests: Recent Labs  Lab 03/01/2018 0612  AST 46*  ALT 70*  ALKPHOS 49  BILITOT 0.7  PROT 7.7  ALBUMIN 4.3   No results for input(s): LIPASE, AMYLASE in the last 168 hours. No results for input(s): AMMONIA in the last 168 hours.  ABG - compensated repiratory acidosis.       Component Value Date/Time   PHART 7.489 (H) 03/11/2018 0322   PCO2ART 48.9 (H) 03/11/2018 0322   PO2ART 63.0 (L) 03/11/2018 0322   HCO3 37.2 (H) 03/11/2018 0322   TCO2 39 (H) 03/11/2018 0322   ACIDBASEDEF 1.0 12/15/2017 1614   O2SAT 93.0 03/11/2018 0322      CBG: Recent Labs  Lab 03/11/18 1959 03/11/18 2346 03/12/18 0341 03/12/18 0812 03/12/18 1222  GLUCAP 113* 131* 119* 135* 121*   Microbiology:  + Rhinovirus. - MRSA, respiratory culture H. Influenzae - B-lactamase negative.   CXR 12/2: hyperinflation with no clear infiltrate or signs of pulmonary edema.  Assessment & Plan:  Critically ill due to acute respiratory failure requiring mechanical ventilation - ready for extubation. Cause of respiratory failure appears to be AECOPD +/- superimposed LRTI - viral panel + for rhinovirus Possible HF component as BNP has been climbing, although examination not suggestive.  Nicotine addiction Critically ill due to agitated delirium requiring titration of sedative infusions. Pulmonary embolism.  PLAN: Passed SBT - extubated successfully.  Chest physiotherapy Continue bronchodilators Close monitoring today as at high risk for reintubation.  Swallow evaluation.  Empiric CAP coverage pending cultures. Repeat BNP and diurese if elevated  Continue NRT Continue current sedation strategy. Transition heparin to Lovenox.  Best practice:  Diet: NPO pending SLP evaluation.  Pain/Anxiety/Delirium protocol (if indicated): dexmedetomidine and prn fentanyl VAP protocol (if indicated): n/a DVT prophylaxis: IV heparin for DVT GI prophylaxis: d/c Glucose control: euglycemic with no coverage. Mobility: Bed rest Code Status: Full - will need to discuss in light of patient's non-compliance. Family Communication: No family present. Disposition: ICU   Critical care time: .     Lynnell Catalan, MD [facility] ICU Physician [facility]  Pager: [phone] Mobile:  [phone] After hours: [phone].

## 2018-03-12 NOTE — Procedures (Signed)
Extubation Procedure Note  Patient Details:   Name: Brian [name] DOB: 01-Aug-1955 MRN: [id number]   Airway Documentation:    Vent end date: 03/12/18 Vent end time: 1213   Evaluation  O2 sats: stable throughout Complications: No apparent complications Patient did tolerate procedure well. Bilateral Breath Sounds: Diminished, Clear   Yes   Patient extubated to 6L [place] without complications. RN at bedside. Positive cuff leak noted. Will continue to monitor.   Rance Muir 03/12/2018, 12:17 PM

## 2018-03-12 NOTE — Progress Notes (Addendum)
ANTICOAGULATION CONSULT NOTE   Pharmacy Consult for heparin  Indication: DVT  No Known Allergies  Patient Measurements: Height: 5\' 7"  (170.2 cm) Weight: 185 lb 6.5 oz (84.1 kg) IBW/kg (Calculated) : 66.1 Heparin Dosing Weight: 85kg  Vital Signs: Temp: 98.6 F (37 C) (12/03 0751) Temp Source: Oral (12/03 0400) BP: 97/65 (12/03 0800) Pulse Rate: 68 (12/03 0800)  Labs: Recent Labs    03/10/18 0346  03/11/18 0406 03/12/18 0306 03/12/18 0716  HGB 15.3  --  15.5 15.8  --   HCT 50.6  --  51.2 52.9*  --   PLT 177  --  183 137*  --   LABPROT 12.0  --  12.8 12.6  --   INR 0.90  --  0.97 0.95  --   HEPARINUNFRC 0.10*   < > 0.48 <0.10* 0.10*  CREATININE 0.77  --  1.05 1.03  --    < > = values in this interval not displayed.    Estimated Creatinine Clearance: 79.1 mL/min (by C-G formula based on SCr of 1.03 mg/dL).  Assessment: 62yom transfered from [facility] intubated and sedated for respiratory distress and hypotension.  New + DVT will start heparin.   Heparin level at goal yesterday then dropped to undetectable this morning, repeat still undetectable. No infusions issues noted per RN. Will adjust rate and recheck level tonight.    Goal of Therapy:  Heparin level 0.3-0.7 units/ml Monitor platelets by anticoagulation protocol: Yes   Plan:  Increase heparin drip to 1900 uts/hr Daily HL, CBC  Thanks for allowing pharmacy to be a part of this patient's care.  Sheppard Coil PharmD., BCPS Clinical Pharmacist 03/12/2018 9:00 AM   Addendum:  New orders received to change to sq lovenox for dvt. Will stop heparin now.   1mg /kg q12 hours of lovenox  03/12/2018 2:55 PM

## 2018-03-12 NOTE — Progress Notes (Signed)
eLink Physician-Brief Progress Note Patient Name: Brian [name] DOB: 17-Sep-1955 MRN: [id number]   Date of Service  03/12/2018  HPI/Events of Note  Agitation - Currently on Precedex IV infusion at 1.2 mcg/kg/hour. QTc interval = 0.47 seconds.   eICU Interventions  Will order: 1. Increase ceiling on Precedex IV infusion to 1.5 mcg/kg/hour.      Intervention Category Major Interventions: Delirium, psychosis, severe agitation - evaluation and management  [name] 03/12/2018, 11:26 PM

## 2018-03-13 LAB — BASIC METABOLIC PANEL
Anion gap: 16 — ABNORMAL HIGH (ref 5–15)
BUN: 27 mg/dL — ABNORMAL HIGH (ref 6–20)
CO2: 26 mmol/L (ref 22–32)
Calcium: 9.9 mg/dL (ref 8.9–10.3)
Chloride: 104 mmol/L (ref 98–111)
Creatinine, Ser: 0.7 mg/dL (ref 0.61–1.24)
GFR calc Af Amer: >60 mL/min (ref >60)
GFR calc non Af Amer: >60 mL/min (ref >60)
Glucose, Bld: 88 mg/dL (ref 70–99)
Potassium: 4.6 mmol/L (ref 3.5–5.1)
Sodium: 146 mmol/L — ABNORMAL HIGH (ref 135–145)

## 2018-03-13 LAB — MAGNESIUM
MAGNESIUM: 1.6 mg/dL — AB (ref 1.7–2.4)
Magnesium: 1.8 mg/dL (ref 1.7–2.4)
Magnesium: 1.7 mg/dL (ref 1.7–2.4)

## 2018-03-13 LAB — PROTIME-INR
INR: 0.95
PROTHROMBIN TIME: 12.5 s (ref 11.4–15.2)

## 2018-03-13 LAB — CULTURE, RESPIRATORY W GRAM STAIN

## 2018-03-13 LAB — CBC
HCT: 51.5 % (ref 39.0–52.0)
Hemoglobin: 15.1 g/dL (ref 13.0–17.0)
MCH: 30.1 pg (ref 26.0–34.0)
MCHC: 29.3 g/dL — ABNORMAL LOW (ref 30.0–36.0)
MCV: 102.8 fL — ABNORMAL HIGH (ref 80.0–100.0)
PLATELETS: 195 10*3/uL (ref 150–400)
RBC: 5.01 MIL/uL (ref 4.22–5.81)
RDW: 12.9 % (ref 11.5–15.5)
WBC: 9.5 10*3/uL (ref 4.0–10.5)
nRBC: 0 % (ref 0.0–0.2)

## 2018-03-13 LAB — GLUCOSE, CAPILLARY
Glucose-Capillary: 100 mg/dL — ABNORMAL HIGH (ref 70–99)
Glucose-Capillary: 103 mg/dL — ABNORMAL HIGH (ref 70–99)
Glucose-Capillary: 108 mg/dL — ABNORMAL HIGH (ref 70–99)
Glucose-Capillary: 76 mg/dL (ref 70–99)
Glucose-Capillary: 78 mg/dL (ref 70–99)
Glucose-Capillary: 86 mg/dL (ref 70–99)
Glucose-Capillary: 93 mg/dL (ref 70–99)
Glucose-Capillary: 94 mg/dL (ref 70–99)

## 2018-03-13 LAB — PHOSPHORUS
Phosphorus: 3.2 mg/dL (ref 2.5–4.6)
Phosphorus: 2.9 mg/dL (ref 2.5–4.6)

## 2018-03-13 MED ORDER — JEVITY 1.5 CAL/FIBER PO LIQD
1000.0000 mL | ORAL | Status: DC
  Administered 2018-03-13 – 2018-03-22 (×9): 1000 mL
  Filled 2018-03-13 (×13): qty 1000

## 2018-03-13 MED ORDER — CLONAZEPAM 0.5 MG PO TBDP
1.0000 mg | ORAL_TABLET | Freq: Two times a day (BID) | ORAL | Status: DC
  Administered 2018-03-13 – 2018-03-15 (×5): 1 mg via ORAL
  Filled 2018-03-13 (×5): qty 2

## 2018-03-13 MED ORDER — VITAL HIGH PROTEIN PO LIQD: 1000.0000 mL | ORAL | Status: DC

## 2018-03-13 MED ORDER — PRO-STAT SUGAR FREE PO LIQD: 30.0000 mL | Freq: Two times a day (BID) | ORAL | Status: DC

## 2018-03-13 MED ORDER — DEXTROSE 5 % IV SOLN
INTRAVENOUS | Status: DC
  Administered 2018-03-13 – 2018-03-17 (×4): via INTRAVENOUS

## 2018-03-13 MED ORDER — PRO-STAT SUGAR FREE PO LIQD
30.0000 mL | Freq: Every day | ORAL | Status: DC
  Administered 2018-03-14 – 2018-03-22 (×8): 30 mL
  Filled 2018-03-13 (×9): qty 30

## 2018-03-13 MED ORDER — CLONAZEPAM 0.1 MG/ML ORAL SUSPENSION
1.0000 mg | Freq: Two times a day (BID) | ORAL | Status: DC
  Filled 2018-03-13: qty 10

## 2018-03-13 NOTE — Evaluation (Signed)
Clinical/Bedside Swallow Evaluation Patient Details  Name: Brian [name] MRN: [id number] Date of Birth: 1956/04/06  Today's Date: 03/13/2018 Time: SLP Start Time (ACUTE ONLY): 4098 SLP Stop Time (ACUTE ONLY): 0852 SLP Time Calculation (min) (ACUTE ONLY): 14 min  Past Medical History:  Past Medical History:  Diagnosis Date  . Alcohol abuse    Heavy alcohol abuse and homelessness  . Arthritis   . Cardiomyopathy (HCC)   . CHF (congestive heart failure) (HCC)   . Cocaine abuse (HCC)    And crack   . COPD (chronic obstructive pulmonary disease) (HCC)   . Heart failure   . Homelessness   . Noncompliance   . Paroxysmal VT (HCC)    a. first seen 2013 in setting of normal LVEF 55-60%, EF now decreased.  . Suicide attempt (HCC)   . Tobacco abuse    Past Surgical History:  Past Surgical History:  Procedure Laterality Date  . INNER EAR SURGERY    . LEFT HEART CATH AND CORONARY ANGIOGRAPHY N/A 02/25/2018   Procedure: LEFT HEART CATH AND CORONARY ANGIOGRAPHY;  Surgeon: Kathleene Hazel, MD;  Location: [facility] INVASIVE CV LAB;  Service: Cardiovascular;  Laterality: N/A;   HPI:  Brian [name] is 62 y.o. M with a PMH of COPD, systolic CHF, alcohol and cocaine use, tobacco use, recurrent respiratory failure episodes, and multiple prior hospitalizations resulting in patient leaving against medical advice. He transferred to [facility] (from [facility]) on 02/14/2018 for severe respiratory distress; ETT 11/29-12/3. Pt seen by [facility] SLP for swallow eval in July 2019 and recommended regular/thin diet without further need for ST. CXR possible mild edema and suspected small left pleural effusion.    Assessment / Plan / Recommendation Clinical Impression  Although awake, pt remained very lethargic throughout clinical swallow evaluation today, requiring continuous stimulation to participate. Pt's vocal quality was hoarse and significantly reduced in intensity, likely due to recent prolonged intubation. He  presented with severe congestion and weak/congested coughing both at baseline and following straw sips of thin. Consistent poor bolus awareness and significant anterior loss was also noted throughout ice chip and thin trials. Given degree of lethargy, no further POs were attempted. Recommend continue NPO (may wish to consider temporary alternative means of nutrition) and ST will continue to follow acutely to provide dysphagia intervention and determine readiness for POs/instrumental swallow evaluation.   SLP Visit Diagnosis: Dysphagia, unspecified (R13.10)    Aspiration Risk  Severe aspiration risk    Diet Recommendation NPO   Medication Administration: Via alternative means    Other  Recommendations Oral Care Recommendations: Oral care QID   Follow up Recommendations Skilled Nursing facility      Frequency and Duration min 2x/week  2 weeks       Prognosis Prognosis for Safe Diet Advancement: Good Barriers to Reach Goals: Severity of deficits      Swallow Study   General HPI: Brian [name] is 62 y.o. M with a PMH of COPD, systolic CHF, alcohol and cocaine use, tobacco use, recurrent respiratory failure episodes, and multiple prior hospitalizations resulting in patient leaving against medical advice. He transferred to [facility] (from [facility]) on 02/25/2018 for severe respiratory distress; ETT 11/29-12/3. Pt seen by [facility] SLP for swallow eval in July 2019 and recommended regular/thin diet without further need for ST. CXR possible mild edema and suspected small left pleural effusion.  Type of Study: Bedside Swallow Evaluation Previous Swallow Assessment: 11/04/17 (reg/thin) Diet Prior to this Study: NPO Temperature Spikes Noted: No Respiratory Status:  Nasal cannula History of Recent Intubation: Yes Length of Intubations (days): 5 days Date extubated: 03/12/18 Behavior/Cognition: Lethargic/Drowsy;Cooperative Oral Cavity Assessment: Dry Oral Care Completed by SLP: Yes Oral Cavity - Dentition:  Edentulous Self-Feeding Abilities: Total assist Patient Positioning: Upright in bed Baseline Vocal Quality: Hoarse;Low vocal intensity Volitional Cough: Weak;Congested Volitional Swallow: Unable to elicit    Oral/Motor/Sensory Function Overall Oral Motor/Sensory Function: Within functional limits(for tasks assessed)   Ice Chips Ice chips: Impaired Presentation: Spoon Oral Phase Impairments: Reduced lingual movement/coordination;Poor awareness of bolus Oral Phase Functional Implications: Left anterior spillage;Prolonged oral transit Pharyngeal Phase Impairments: Suspected delayed Swallow   Thin Liquid Thin Liquid: Impaired Presentation: Straw;Cup Oral Phase Impairments: Reduced labial seal;Poor awareness of bolus Oral Phase Functional Implications: Left anterior spillage Pharyngeal  Phase Impairments: Suspected delayed Swallow;Throat Clearing - Immediate    Nectar Thick Nectar Thick Liquid: Not tested   Honey Thick Honey Thick Liquid: Not tested   Puree Puree: Not tested   Solid    Suzzette Righter, Student SLP  Solid: Not tested      Suzzette Righter 03/13/2018,9:18 AM

## 2018-03-13 NOTE — Procedures (Signed)
Cortrak  Person Inserting Tube:  Kendell Bane C, RD Tube Type:  Cortrak - 43 inches Tube Location:  Right nare Initial Placement:  Stomach Secured by: Bridle Technique Used to Measure Tube Placement:  Documented cm marking at nare/ corner of mouth Cortrak Secured At:  75 cm    Cortrak Tube Team Note:  Consult received to place a Cortrak feeding tube.   No x-ray is required. RN may begin using tube.   If the tube becomes dislodged please keep the tube and contact the Cortrak team at [email or web address] (password [email or web address]) for replacement.  If after hours and replacement cannot be delayed, place a NG tube and confirm placement with an abdominal x-ray.    Kendell Bane RD, LDN, CNSC [phone] Pager [phone] After Hours Pager'

## 2018-03-13 NOTE — Progress Notes (Signed)
Nutrition Follow-up  INTERVENTION:   Tube feeding via Cortrak: - Jevity 1.5 @ 55 ml/hr (1320 ml/day) - Pro-stat 30 ml daily - Free water flushes per MD  Tube feeding regimen provides 2080 kcal, 99 grams of protein, and 1003 ml of H2O.  NUTRITION DIAGNOSIS:   Inadequate oral intake related to inability to eat as evidenced by NPO status.  Ongoing  GOAL:   Patient will meet greater than or equal to 90% of their needs  Met via TF regimen  MONITOR:   Vent status, Labs, I & O's, Weight trends, TF tolerance  REASON FOR ASSESSMENT:   Ventilator, Consult Enteral/tube feeding initiation and management  ASSESSMENT:   62 year old male who presented to the [facility] ED on 11/29 with acute respiratory failure. Pt was intubated in the ED. PMH significant for COPD, CHF, substance use, tobacco use, and multiple prior hospitalizations with pt leaving AMA.  12/1 - pt with episode of emesis, TF held 12/3 - extubated 12/4 - NPO per SLP, Cortrak placed (tip is gastric)  Discussed pt with RN. Pt lethargic due to recent bath and Cortrak placement. Per RN, pt tolerated Cortrak placement well. Per RN, attempting to wean Precedex.  Current tube feeding order per Adult TF Protocol: Vital High Protein @ 40 ml/hr, Pro-stat 30 ml BID  Medications reviewed and include: folic acid, thiamine, IV Rocephin, Precedex @ 18 ml/hr, D5 @ 30 ml/hr  Labs reviewed: sodium 146 (H), BUN 27 (H) CBG's: 94, 93, 76, 100, 78 x 12 hours  UOP: 2000 ml x 24 hours  Diet Order:   Diet Order            Diet NPO time specified  Diet effective now              EDUCATION NEEDS:   Not appropriate for education at this time  Skin:  Skin Assessment: Reviewed RN Assessment  Last BM:  12/4  Height:   Ht Readings from Last 1 Encounters:  02/08/2018 _0  (1.702 m)    Weight:   Wt Readings from Last 1 Encounters:  03/13/18 82.3 kg    Ideal Body Weight:  67.3 kg  BMI:  Body mass index is 28.42  kg/m.  Estimated Nutritional Needs:   Kcal:  2000-2200  Protein:  100-115 grams  Fluid:  >/= 1.7 L    Gaynell Face, MS, RD, LDN Inpatient Clinical Dietitian Pager: [phone] Weekend/After Hours: [phone]

## 2018-03-13 NOTE — Progress Notes (Signed)
CPT not done x 2 attempts d/t pt in procedure at bedside.

## 2018-03-13 NOTE — Progress Notes (Signed)
NAME:  Brian [name], MRN:  [id number], DOB:  03/15/1956, LOS: 5 ADMISSION DATE:  April 06, 2018, CONSULTATION DATE:  04/06/2018 REFERRING MD:  Lamont Snowball - ED, CHIEF COMPLAINT:  Acute respiratory failure.   HPI/course in hospital  62 year old man who was intubated and transferred from [facility] for hypercapneic respiratory failure.   Past Medical History  He,  has a past medical history of Alcohol abuse, Arthritis, Cardiomyopathy (HCC), CHF (congestive heart failure) (HCC) with EF 25%. ,Cocaine abuse (HCC), COPD (chronic obstructive pulmonary disease) (HCC), Heart failure, Homelessness, Noncompliance with multiple trips to hospital before leaving AMA  , Paroxysmal VT (HCC), Suicide attempt (HCC), and Tobacco abuse.    Interim history/subjective:  Periodic agitation overnight.   Objective   Blood pressure 105/65, pulse (!) 59, temperature 98.1 F (36.7 C), temperature source Oral, resp. rate (!) 24, height 5\' 7"  (1.702 m), weight 82.3 kg, SpO2 94 %.    Vent Mode: PSV;CPAP FiO2 (%):  [40 %] 40 % PEEP:  [5 cmH20] 5 cmH20 Pressure Support:  [7 cmH20] 7 cmH20   Intake/Output Summary (Last 24 hours) at 03/13/2018 1052 Last data filed at 03/13/2018 1000 Gross per 24 hour  Intake 765.37 ml  Output 2350 ml  Net -1584.63 ml   Filed Weights   03/11/18 0500 03/12/18 0419 03/13/18 0500  Weight: 84.6 kg 84.1 kg 82.3 kg    Examination: Physical Exam  Constitutional: He appears well-developed and well-nourished. He appears listless. No distress. He is sedated.  HENT:  Head: Normocephalic.  Eyes: Pupils are equal, round, and reactive to light. Conjunctivae are normal.  Cardiovascular: Regular rhythm and normal heart sounds. Tachycardia present.  Extremities warm   Respiratory: Breath sounds normal. No accessory muscle usage. Tachypnea noted. No respiratory distress. He has no wheezes.  Distressed and not tolerating PSV 12/8  GI: Soft. There is no tenderness.  Genitourinary:  Genitourinary  Comments: Catheter in place.  Neurological: He appears listless.  Skin: Skin is warm.  Line sites intact     Ancillary tests (personally reviewed)  CBC: Recent Labs  Lab 04-06-2018 0612 03/09/18 0441 03/10/18 0346 03/11/18 0406 03/12/18 0306 03/13/18 0534  WBC 12.1* 15.6* 15.2* 11.8* 9.9 9.5  NEUTROABS 10.2*  --   --   --   --   --   HGB 18.0* 15.6 15.3 15.5 15.8 15.1  HCT 57.3* 51.0 50.6 51.2 52.9* 51.5  MCV 101.1* 101.2* 102.2* 102.0* 102.7* 102.8*  PLT 211 194 177 183 137* 195    Basic Metabolic Panel: Recent Labs  Lab 03/09/18 0441 03/09/18 1525 03/10/18 0346 03/10/18 1712 03/11/18 0406 03/12/18 0306 03/13/18 0534  NA 138  --  138  --  141 141 146*  K 4.2  --  3.9  --  4.3 4.6 4.6  CL 98  --  97*  --  97* 99 104  CO2 32  --  30  --  39* 28 26  GLUCOSE 164*  --  147*  --  118* 116* 88  BUN 26*  --  26*  --  26* 30* 27*  CREATININE 0.94  --  0.77  --  1.05 1.03 0.70  CALCIUM 8.9  --  9.1  --  9.4 9.7 9.9  MG 2.1 2.0 1.7 1.8 2.3 2.0 1.8  PHOS 3.9 3.0 2.8 3.8 3.6  --   --    GFR: Estimated Creatinine Clearance: 100.8 mL/min (by C-G formula based on SCr of 0.7 mg/dL). Recent Labs  Lab 06-Apr-2018 [phone]  2018/03/24 1950  03/10/18 0346 03/11/18 0406 03/12/18 0306 03/13/18 0534  WBC  --    < >  --    < > 15.2* 11.8* 9.9 9.5  LATICACIDVEN 0.9  --  1.5  --   --   --   --   --    < > = values in this interval not displayed.    Liver Function Tests: Recent Labs  Lab 03-24-2018 0612  AST 46*  ALT 70*  ALKPHOS 49  BILITOT 0.7  PROT 7.7  ALBUMIN 4.3   No results for input(s): LIPASE, AMYLASE in the last 168 hours. No results for input(s): AMMONIA in the last 168 hours.  ABG - compensated repiratory acidosis.     Component Value Date/Time   PHART 7.489 (H) 03/11/2018 0322   PCO2ART 48.9 (H) 03/11/2018 0322   PO2ART 63.0 (L) 03/11/2018 0322   HCO3 37.2 (H) 03/11/2018 0322   TCO2 39 (H) 03/11/2018 0322   ACIDBASEDEF 1.0 12/15/2017 1614   O2SAT 93.0  03/11/2018 0322      CBG: Recent Labs  Lab 03/12/18 1953 03/13/18 0005 03/13/18 0008 03/13/18 0422 03/13/18 0812  GLUCAP 126* 78 100* 76 93   Microbiology:  + Rhinovirus. - MRSA, respiratory culture H. Influenzae - B-lactamase negative.   CXR 12/2: hyperinflation with no clear infiltrate or signs of pulmonary edema.  Assessment & Plan:  Was critically ill due to acute respiratory failure requiring mechanical ventilation - extubated 03/12/18 Cause of respiratory failure appears to be AECOPD +/- superimposed LRTI - viral panel + for rhinovirus Possible HF component as BNP has been climbing, although examination not suggestive.  Nicotine addiction Still Critically ill due to agitated delirium requiring titration of sedative infusions. Pulmonary embolism.  PLAN: Passed SBT - extubated successfully.  Continue chest physiotherapy Continue bronchodilators Close monitoring today as at high risk for reintubation.  SBFT as failed swallow evaluation.  Empiric CAP coverage pending cultures. Repeat BNP and diurese if elevated  Continue NRT Continue current sedation strategy with precedex. - introduce enteral anxiolytics once Cortrak in place. Transition heparin to Lovenox.  Best practice:  Diet: SBFT  Pain/Anxiety/Delirium protocol (if indicated): dexmedetomidine and prn fentanyl VAP protocol (if indicated): n/a DVT prophylaxis: IV heparin for DVT GI prophylaxis: d/c Glucose control: euglycemic with no coverage. Mobility: Bed rest Code Status: Full - will need to discuss in light of patient's non-compliance. Family Communication: No family present. Disposition: ICU   Critical care time: .     Lynnell Catalan, MD [facility] ICU Physician [facility] [place] Critical Care  Pager: [phone] Mobile: [phone] After hours: [phone].

## 2018-03-13 NOTE — Progress Notes (Signed)
Occupational Therapy Evaluation Patient Details Name: Brian [name] MRN: [id number] DOB: 02-Feb-1956 Today's Date: 03/13/2018    History of Present Illness 62 year old man with multiple hospital admissions (11) in past 6 months who was intubated and transferred from [facility] for hypercapneic respiratory failure. Extubated 12/3. Past medical history of Alcohol abuse, Arthritis, Cardiomyopathy (HCC), CHF (congestive heart failure) (HCC) with EF 25%.Paroxysmal VT (HCC), Suicide attempt (HCC), and Tobacco abuse.   Clinical Impression   Pt apparently homeless PTA, completing ADL and mobility at independent level. Unable to document PLOF/living situation as pt confused and no family/caregiver available.  Pt currently requires +2 Mod A for sit - stand and able to take several side steps to reposition self in bed. VSS with exception of O2 desat to low 80s although poor waveform. Unable to progress OOB to chair today due to having coretrack placed. Once EOB, pt more alert and following 1 step commands with a delay in processing, although pt on precedex. Pt attempting to verbalize and joke at times although difficult to understand. Pt will benefit from rehab at SNF to maximize functional level of independence. Will follow acutely to address established goals.  Pt will most likely benefit from use of sitter in order to safely progress to OOB.     Follow Up Recommendations  SNF;Supervision/Assistance - 24 hour    Equipment Recommendations  3 in 1 bedside commode    Recommendations for Other Services       Precautions / Restrictions Precautions Precautions: Fall;Other (comment) Precaution Comments: watch O2 Sats      Mobility Bed Mobility Overal bed mobility: Needs Assistance Bed Mobility: Supine to Sit     Supine to sit: Mod assist;+2 for physical assistance     General bed mobility comments: Pt shaking head when asked to sit EOB; Initially required Max A, then once more alert, pt helping to  lift legs to EOB; vc to use rails to assist and physical A to translate trunk to upright  Transfers Overall transfer level: Needs assistance Equipment used: 2 person hand held assist Transfers: Sit to/from Stand Sit to Stand: Mod assist;+2 physical assistance         General transfer comment: unable to achieve full upright posture in standing    Balance Overall balance assessment: Needs assistance   Sitting balance-Leahy Scale: Fair       Standing balance-Leahy Scale: Poor                             ADL either performed or assessed with clinical judgement   ADL Overall ADL's : Needs assistance/impaired Eating/Feeding: NPO   Grooming: Moderate assistance   Upper Body Bathing: Maximal assistance;Sitting   Lower Body Bathing: Maximal assistance;Sit to/from stand   Upper Body Dressing : Maximal assistance;Sitting   Lower Body Dressing: Total assistance;Sit to/from stand               Functional mobility during ADLs: +2 for physical assistance;Moderate assistance;Cueing for safety;Cueing for sequencing       Vision   Additional Comments: unsure if pt wears glasses     Perception Perception Comments: will further assess   Praxis      Pertinent Vitals/Pain Pain Assessment: Faces Faces Pain Scale: Hurts little more Pain Location: RUE with movemetn Pain Descriptors / Indicators: Grimacing;Discomfort Pain Intervention(s): Limited activity within patient's tolerance     Hand Dominance Right   Extremity/Trunk Assessment Upper Extremity Assessment Upper Extremity  Assessment: Generalized weakness;RUE deficits/detail;LUE deficits/detail RUE Deficits / Details: pain with R shoulder movemetn - premorbid deficits? LUE Deficits / Details: able to reach face to wash face; generalized weakness   Lower Extremity Assessment Lower Extremity Assessment: Defer to PT evaluation   Cervical / Trunk Assessment Cervical / Trunk Assessment: Kyphotic;Other  exceptions(forward head)   Communication Communication Communication: Other (comment)(poor voice quality; difficult to understand)   Cognition Arousal/Alertness: Lethargic;Suspect due to medications(on Precedex) Behavior During Therapy: Flat affect Overall Cognitive Status: Impaired/Different from baseline Area of Impairment: Orientation;Attention;Safety/judgement;Awareness;Problem solving                 Orientation Level: Disoriented to;Place;Time;Situation Current Attention Level: Sustained     Safety/Judgement: Decreased awareness of safety;Decreased awareness of deficits Awareness: Intellectual Problem Solving: Slow processing;Decreased initiation     General Comments       Exercises Exercises: General Upper Extremity General Exercises - Upper Extremity Shoulder Flexion: AAROM;10 reps;Both Shoulder ABduction: AAROM;Both;10 reps;Seated Elbow Flexion: AROM;AAROM;Both;10 reps;Seated Elbow Extension: AROM;AAROM;Both;10 reps;Seated Digit Composite Flexion: AROM;AAROM;Both;10 reps;Seated Composite Extension: AROM;AAROM;Both;10 reps;Seated   Shoulder Instructions      Home Living Family/patient expects to be discharged to:: Unsure                                        Prior Functioning/Environment Level of Independence: Independent        Comments: Pt unable to give PLOF/living situation but is known to PT who states pt is homeless.        OT Problem List: Decreased strength;Decreased range of motion;Decreased activity tolerance;Impaired balance (sitting and/or standing);Decreased cognition;Decreased coordination;Decreased safety awareness;Decreased knowledge of use of DME or AE;Cardiopulmonary status limiting activity;Impaired UE functional use;Pain      OT Treatment/Interventions: Self-care/ADL training;Therapeutic exercise;Energy conservation;DME and/or AE instruction;Therapeutic activities;Cognitive remediation/compensation;Patient/family  education;Balance training    OT Goals(Current goals can be found in the care plan section) Acute Rehab OT Goals Patient Stated Goal: to comb his hair OT Goal Formulation: Patient unable to participate in goal setting Time For Goal Achievement: 03/27/18 Potential to Achieve Goals: Good  OT Frequency: Min 2X/week   Barriers to D/C:    homeless       Co-evaluation PT/OT/SLP Co-Evaluation/Treatment: Yes Reason for Co-Treatment: Complexity of the patient's impairments (multi-system involvement);For patient/therapist safety;Necessary to address cognition/behavior during functional activity   OT goals addressed during session: ADL's and self-care      AM-PAC OT "6 Clicks" Daily Activity     Outcome Measure Help from another person eating meals?: Total Help from another person taking care of personal grooming?: A Lot Help from another person toileting, which includes using toliet, bedpan, or urinal?: Total Help from another person bathing (including washing, rinsing, drying)?: A Lot Help from another person to put on and taking off regular upper body clothing?: A Lot Help from another person to put on and taking off regular lower body clothing?: Total 6 Click Score: 9   End of Session Equipment Utilized During Treatment: Gait belt;Oxygen Nurse Communication: Mobility status;Need for lift equipment(Stedy)  Activity Tolerance: Patient tolerated treatment well Patient left: in bed;with call bell/phone within reach;with nursing/sitter in room(preparing pt for coretrack)  OT Visit Diagnosis: Other abnormalities of gait and mobility (R26.89);Muscle weakness (generalized) (M62.81);Other symptoms and signs involving cognitive function;Pain Pain - Right/Left: Right Pain - part of body: Shoulder  Time: 1610-9604 OT Time Calculation (min): 32 min Charges:  OT General Charges $OT Visit: 1 Visit OT Evaluation $OT Eval Moderate Complexity: 1 Mod  Dantae Meunier, OT/L   Acute  OT Clinical Specialist Acute Rehabilitation Services Pager [phone] Office [phone]   [facility] 03/13/2018, 1:50 PM

## 2018-03-13 NOTE — Evaluation (Signed)
Physical Therapy Evaluation Patient Details Name: Brian [name] MRN: [id number] DOB: 11/26/1955 Today's Date: 03/13/2018   History of Present Illness  62 year old man who was intubated and transferred from [facility] for hypercapneic respiratory failure. Extubated 12/3. Past medical history of Alcohol abuse, Arthritis, Cardiomyopathy (HCC), CHF (congestive heart failure) (HCC) with EF 25%.Paroxysmal VT (HCC), Suicide attempt (HCC), and Tobacco abuse.  Clinical Impression  Pt admitted with above diagnosis. Pt currently with functional limitations due to the deficits listed below (see PT Problem List). Patient homeless prior to admission. Currently presenting with decreased functional mobility secondary to cognitive impairments, weakness, and balance impairments. Requiring two person moderate assistance for transfers and taking several side steps. SpO2 90% on 6L O2, HR 65-71 bpm. Pt will benefit from skilled PT to increase their independence and safety with mobility to allow discharge to the venue listed below.       Follow Up Recommendations SNF;Supervision/Assistance - 24 hour    Equipment Recommendations  Other (comment)(defer)    Recommendations for Other Services       Precautions / Restrictions Precautions Precautions: Fall;Other (comment) Precaution Comments: watch O2 Sats Restrictions Weight Bearing Restrictions: No      Mobility  Bed Mobility Overal bed mobility: Needs Assistance Bed Mobility: Supine to Sit     Supine to sit: Mod assist;+2 for physical assistance     General bed mobility comments: Pt shaking head when asked to sit EOB; Initially required Max A, then once more alert, pt helping to lift legs to EOB; vc to use rails to assist and physical A to trnaslate trunk to upright  Transfers Overall transfer level: Needs assistance Equipment used: 2 person hand held assist Transfers: Sit to/from Stand Sit to Stand: Mod assist;+2 physical assistance          General transfer comment: ModA + 2 for standing x 2 trials. Pt with significant forward trunk/hip/knee flexion despite multidmoal cueing. unable to achieve full upright posture in standing. Took several side steps towards head of bed with cues for initiation  Ambulation/Gait                Stairs            Wheelchair Mobility    Modified Rankin (Stroke Patients Only)       Balance Overall balance assessment: Needs assistance   Sitting balance-Leahy Scale: Fair       Standing balance-Leahy Scale: Poor                               Pertinent Vitals/Pain Pain Assessment: Faces Faces Pain Scale: Hurts little more Pain Location: RUE with movemetn Pain Descriptors / Indicators: Grimacing;Discomfort Pain Intervention(s): Limited activity within patient's tolerance;Monitored during session    Home Living Family/patient expects to be discharged to:: Unsure                      Prior Function Level of Independence: Independent         Comments: Pt unable to give PLOF/living situation but is known to PT who states pt is homeless.     Hand Dominance   Dominant Hand: Right    Extremity/Trunk Assessment   Upper Extremity Assessment Upper Extremity Assessment: RUE deficits/detail;Generalized weakness RUE Deficits / Details: pain with right shoulder flexion LUE Deficits / Details: able to reach face to wash face; generalized weakness    Lower Extremity Assessment Lower Extremity Assessment:  Generalized weakness    Cervical / Trunk Assessment Cervical / Trunk Assessment: Kyphotic;Other exceptions(forward head)  Communication   Communication: Other (comment)(poor voice quality; difficult to understand)  Cognition Arousal/Alertness: Lethargic;Suspect due to medications(on Precedex) Behavior During Therapy: Flat affect Overall Cognitive Status: Impaired/Different from baseline Area of Impairment:  Orientation;Attention;Safety/judgement;Awareness;Problem solving                 Orientation Level: Disoriented to;Place;Time;Situation Current Attention Level: Sustained     Safety/Judgement: Decreased awareness of safety;Decreased awareness of deficits Awareness: Intellectual Problem Solving: Slow processing;Decreased initiation        General Comments      Exercises General Exercises - Upper Extremity Shoulder Flexion: AAROM;10 reps;Both Shoulder ABduction: AAROM;Both;10 reps;Seated Elbow Flexion: AROM;AAROM;Both;10 reps;Seated Elbow Extension: AROM;AAROM;Both;10 reps;Seated Digit Composite Flexion: AROM;AAROM;Both;10 reps;Seated Composite Extension: AROM;AAROM;Both;10 reps;Seated   Assessment/Plan    PT Assessment Patient needs continued PT services  PT Problem List Decreased strength;Decreased range of motion;Decreased activity tolerance;Decreased balance;Decreased mobility;Decreased coordination;Decreased cognition;Pain       PT Treatment Interventions DME instruction;Gait training;Functional mobility training;Therapeutic activities;Therapeutic exercise;Balance training;Cognitive remediation;Patient/family education    PT Goals (Current goals can be found in the Care Plan section)  Acute Rehab PT Goals Patient Stated Goal: to comb his hair PT Goal Formulation: With patient Time For Goal Achievement: 03/27/18 Potential to Achieve Goals: Fair    Frequency Min 2X/week   Barriers to discharge Other (comment) homeless    Co-evaluation PT/OT/SLP Co-Evaluation/Treatment: Yes Reason for Co-Treatment: Complexity of the patient's impairments (multi-system involvement);For patient/therapist safety;To address functional/ADL transfers PT goals addressed during session: Mobility/safety with mobility OT goals addressed during session: ADL's and self-care       AM-PAC PT "6 Clicks" Mobility  Outcome Measure Help needed turning from your back to your side while in  a flat bed without using bedrails?: A Little Help needed moving from lying on your back to sitting on the side of a flat bed without using bedrails?: A Lot Help needed moving to and from a bed to a chair (including a wheelchair)?: A Lot Help needed standing up from a chair using your arms (e.g., wheelchair or bedside chair)?: A Lot Help needed to walk in hospital room?: A Lot Help needed climbing 3-5 steps with a railing? : Total 6 Click Score: 12    End of Session Equipment Utilized During Treatment: Gait belt;Oxygen Activity Tolerance: Patient tolerated treatment well Patient left: in bed;with call bell/phone within reach;with nursing/sitter in room Nurse Communication: Mobility status PT Visit Diagnosis: Unsteadiness on feet (R26.81);Muscle weakness (generalized) (M62.81);Repeated falls (R29.6)    Time: 6073-7106 PT Time Calculation (min) (ACUTE ONLY): 30 min   Charges:   PT Evaluation $PT Eval Moderate Complexity: 1 Mod         Laurina Bustle, [place], DPT Acute Rehabilitation Services Pager [phone] Office [phone]   Vanetta Mulders 03/13/2018, 2:16 PM

## 2018-03-13 NOTE — Progress Notes (Signed)
eLink Physician-Brief Progress Note Patient Name: Brian [name] DOB: [birth year] MRN: [id number]   Date of Service  03/13/2018  HPI/Events of Note  Hypoglycemia Blood glucose = 78. Patient is NPO.   eICU Interventions  Will order: 1. D5W to run IV at 30 mL/hour.      Intervention Category Major Interventions: Other:  [name] Dennard Nip 03/13/2018, 5:07 AM

## 2018-03-13 NOTE — Progress Notes (Signed)
Called Dr. Dellie Catholic in regards to increased patient agitation/aggression. Patient attempting to get out of bed, states he wants to leave now. Explained to patient that he is still in the hospital and reminded him of the events from the last few days. This seemed to calm patient for now, will continue to monitor closely.   New orders for precedex titration received.

## 2018-03-14 ENCOUNTER — Ambulatory Visit: Payer: Self-pay | Admitting: Cardiology

## 2018-03-14 LAB — PROTIME-INR
INR: 0.97
PROTHROMBIN TIME: 12.8 s (ref 11.4–15.2)

## 2018-03-14 LAB — BASIC METABOLIC PANEL
Anion gap: 11 (ref 5–15)
BUN: 25 mg/dL — ABNORMAL HIGH (ref 6–20)
CO2: 30 mmol/L (ref 22–32)
Calcium: 9.6 mg/dL (ref 8.9–10.3)
Chloride: 101 mmol/L (ref 98–111)
Creatinine, Ser: 0.56 mg/dL — ABNORMAL LOW (ref 0.61–1.24)
Glucose, Bld: 112 mg/dL — ABNORMAL HIGH (ref 70–99)
Potassium: 3.9 mmol/L (ref 3.5–5.1)
Sodium: 142 mmol/L (ref 135–145)

## 2018-03-14 LAB — GLUCOSE, CAPILLARY
Glucose-Capillary: 103 mg/dL — ABNORMAL HIGH (ref 70–99)
Glucose-Capillary: 110 mg/dL — ABNORMAL HIGH (ref 70–99)
Glucose-Capillary: 116 mg/dL — ABNORMAL HIGH (ref 70–99)
Glucose-Capillary: 121 mg/dL — ABNORMAL HIGH (ref 70–99)
Glucose-Capillary: 127 mg/dL — ABNORMAL HIGH (ref 70–99)
Glucose-Capillary: 129 mg/dL — ABNORMAL HIGH (ref 70–99)

## 2018-03-14 LAB — PHOSPHORUS
Phosphorus: 5.8 mg/dL — ABNORMAL HIGH (ref 2.5–4.6)
Phosphorus: 4 mg/dL (ref 2.5–4.6)

## 2018-03-14 LAB — MAGNESIUM
MAGNESIUM: 1.6 mg/dL — AB (ref 1.7–2.4)
Magnesium: 1.9 mg/dL (ref 1.7–2.4)

## 2018-03-14 MED ORDER — MAGNESIUM SULFATE 2 GM/50ML IV SOLN
2.0000 g | Freq: Once | INTRAVENOUS | Status: AC
  Administered 2018-03-14: 2 g via INTRAVENOUS
  Filled 2018-03-14: qty 50

## 2018-03-14 MED ORDER — CHLORHEXIDINE GLUCONATE 0.12 % MT SOLN
15.0000 mL | Freq: Two times a day (BID) | OROMUCOSAL | Status: DC
  Administered 2018-03-14 – 2018-03-17 (×5): 15 mL via OROMUCOSAL
  Filled 2018-03-14 (×2): qty 15

## 2018-03-14 MED ORDER — ORAL CARE MOUTH RINSE
15.0000 mL | Freq: Two times a day (BID) | OROMUCOSAL | Status: DC
  Administered 2018-03-14 – 2018-03-17 (×7): 15 mL via OROMUCOSAL

## 2018-03-14 MED ORDER — IPRATROPIUM-ALBUTEROL 0.5-2.5 (3) MG/3ML IN SOLN
3.0000 mL | Freq: Four times a day (QID) | RESPIRATORY_TRACT | Status: DC
  Administered 2018-03-14 – 2018-04-06 (×88): 3 mL via RESPIRATORY_TRACT
  Filled 2018-03-14 (×91): qty 3

## 2018-03-14 NOTE — Progress Notes (Signed)
SLP Cancellation Note  Patient Details Name: Brian [name] MRN: [id number] DOB: 16-Aug-1955   Cancelled treatment:       Reason Eval/Treat Not Completed: Patient not medically ready. No improvement to warrant Po trials today. Will follow for readiness.    Nassim Cosma, Riley Nearing 03/14/2018, 2:19 PM

## 2018-03-14 NOTE — Progress Notes (Signed)
NAME:  LYAM PROVENCIO, MRN:  [id number], DOB:  1955-04-20, LOS: 6 ADMISSION DATE:  02/26/2018, CONSULTATION DATE:  02/08/2018 REFERRING MD:  Lamont Snowball - ED, CHIEF COMPLAINT:  Acute respiratory failure.   HPI/course in hospital  62 year old man who was intubated and transferred from [facility] for hypercapneic respiratory failure.   Past Medical History  He,  has a past medical history of Alcohol abuse, Arthritis, Cardiomyopathy (HCC), CHF (congestive heart failure) (HCC) with EF 25%. ,Cocaine abuse (HCC), COPD (chronic obstructive pulmonary disease) (HCC), Heart failure, Homelessness, Noncompliance with multiple trips to hospital before leaving AMA  , Paroxysmal VT (HCC), Suicide attempt (HCC), and Tobacco abuse.   Interim history/subjective:  Extubated, fairly sedate. precedex being weaned, now 0.2 Has had sinus tachycardia, 120-140   Objective   Blood pressure (!) 164/79, pulse (!) 129, temperature 98.6 F (37 C), temperature source Oral, resp. rate 18, height 5\' 7"  (1.702 m), weight 82.3 kg, SpO2 (!) 86 %.        Intake/Output Summary (Last 24 hours) at 03/14/2018 1014 Last data filed at 03/14/2018 0800 Gross per 24 hour  Intake 1460.31 ml  Output 1200 ml  Net 260.31 ml   Filed Weights   03/12/18 0419 03/13/18 0500 03/14/18 0500  Weight: 84.1 kg 82.3 kg 82.3 kg   General: Chronically ill-appearing man, somewhat disheveled Neuro: Sleepy, wakes to voice and will mumble responses, moves his upper extremities, lower extremities. HEENT: Poor dentition, oropharynx clear, pupils equal and react Neck: Some upper airway secretions noted, no overt stridor Lungs: Very distant, basal lateral inspiratory crackles, no wheezing CV: Regular, tachycardic to 130s, no murmur Abd: Soft, no apparent tenderness, positive bowel sounds Ext: Trace lower extremity edema   Examination: Vitals:   03/14/18 0900 03/14/18 1000 03/14/18 1045 03/14/18 1100  BP:  126/78  (!) 142/95  Pulse: (!) 129 (!)  142 (!) 140 (!) 145  Resp: 18 (!) 28 (!) 24 18  Temp:      TempSrc:      SpO2: (!) 86% 92% 94% 94%  Weight:      Height:         Ancillary tests (personally reviewed)  CBC: Recent Labs  Lab 02/25/2018 0612 03/09/18 0441 03/10/18 0346 03/11/18 0406 03/12/18 0306 03/13/18 0534  WBC 12.1* 15.6* 15.2* 11.8* 9.9 9.5  NEUTROABS 10.2*  --   --   --   --   --   HGB 18.0* 15.6 15.3 15.5 15.8 15.1  HCT 57.3* 51.0 50.6 51.2 52.9* 51.5  MCV 101.1* 101.2* 102.2* 102.0* 102.7* 102.8*  PLT 211 194 177 183 137* 195    Basic Metabolic Panel: Recent Labs  Lab 03/10/18 0346 03/10/18 1712 03/11/18 0406 03/12/18 0306 03/13/18 0534 03/13/18 1304 03/13/18 1924 03/14/18 0421  NA 138  --  141 141 146*  --   --  142  K 3.9  --  4.3 4.6 4.6  --   --  3.9  CL 97*  --  97* 99 104  --   --  101  CO2 30  --  39* 28 26  --   --  30  GLUCOSE 147*  --  118* 116* 88  --   --  112*  BUN 26*  --  26* 30* 27*  --   --  25*  CREATININE 0.77  --  1.05 1.03 0.70  --   --  0.56*  CALCIUM 9.1  --  9.4 9.7 9.9  --   --  9.6  MG 1.7 1.8 2.3 2.0 1.8 1.7 1.6* 1.6*  PHOS 2.8 3.8 3.6  --   --  3.2 2.9 5.8*   GFR: Estimated Creatinine Clearance: 100.8 mL/min (A) (by C-G formula based on SCr of 0.56 mg/dL (L)). Recent Labs  Lab 2018/03/18 0610  03/18/2018 1950  03/10/18 0346 03/11/18 0406 03/12/18 0306 03/13/18 0534  WBC  --    < >  --    < > 15.2* 11.8* 9.9 9.5  LATICACIDVEN 0.9  --  1.5  --   --   --   --   --    < > = values in this interval not displayed.    Liver Function Tests: Recent Labs  Lab March 18, 2018 0612  AST 46*  ALT 70*  ALKPHOS 49  BILITOT 0.7  PROT 7.7  ALBUMIN 4.3   No results for input(s): LIPASE, AMYLASE in the last 168 hours. No results for input(s): AMMONIA in the last 168 hours.  ABG - compensated repiratory acidosis.     Component Value Date/Time   PHART 7.489 (H) 03/11/2018 0322   PCO2ART 48.9 (H) 03/11/2018 0322   PO2ART 63.0 (L) 03/11/2018 0322   HCO3 37.2 (H)  03/11/2018 0322   TCO2 39 (H) 03/11/2018 0322   ACIDBASEDEF 1.0 12/15/2017 1614   O2SAT 93.0 03/11/2018 0322      CBG: Recent Labs  Lab 03/13/18 1649 03/13/18 1959 03/13/18 2343 03/14/18 0414 03/14/18 0808  GLUCAP 86 103* 108* 110* 103*   Microbiology:  + Rhinovirus. - MRSA, respiratory culture H. Influenzae - B-lactamase negative.   CXR 12/2: hyperinflation with no clear infiltrate or signs of pulmonary edema.  Assessment & Plan:  Acute respiratory failure requiring mechanical ventilation - extubated 03/12/18 Cause of respiratory failure appears to be AECOPD +/- superimposed LRTI - viral panel + for rhinovirus Consider PE Continue to push aggressive pulmonary hygiene, balanced degree of sedation with agitation and airway protection.  Adjust sedation 12/5 Okay for NT suctioning as needed Continue bronchodilators as ordered Ceftriaxone day #4, empiric for possible CAP.  Follow chest x-ray 12/6  Possible HF component as BNP has been climbing, although examination not suggestive.  Sinus tachycardia, question due to volume status, possibly withdrawal Goal volume status even to slightly net negative  Encephalopathy, agitated delirium requiring titration of sedating medications Goal transition off Precedex 12/5 Continue scheduled clonazepam Decrease symptom triggered sedating medications, he appears to be oversedated today  R femoral DVT with suspected pulmonary embolism. Anticoagulation with enoxaparin  Tobacco abuse BD's as ordered  Dysphagia.  Failed swallow evaluation Core track tube in place   Best practice:  Diet: SBFT  Pain/Anxiety/Delirium protocol (if indicated): Clonazepam, weaning Precedex off VAP protocol (if indicated): n/a DVT prophylaxis: Enoxaparin GI prophylaxis: d/c Glucose control: euglycemic with no coverage. Mobility: Bed rest Code Status: Full - will need to discuss in light of patient's non-compliance. Family Communication: No family  12/5 Disposition: ICU   Critical care time: 71 minutes     Levy Pupa, MD, PhD 03/14/2018, 12:07 PM [facility] [phone] or if no answer [phone]

## 2018-03-14 NOTE — Progress Notes (Signed)
RN noted patient was sating in upper 80s and was more sleepy than an hour ago. Patient would still arouse to voice. Patient would still cough, but secretions could be heard audibly.  Patient was transferred from chair to bed placed on NRB at 15L.  Patient was NT suctioned by RT and had multiple white secretions.  Upon reassessment, patient's lungs were clearer sounding.  Patient now on HFNC at 15L, tachypneic at times.

## 2018-03-14 NOTE — Progress Notes (Signed)
RN paged CCM regarding pt heart rate in 140s.  No new orders given at this time.

## 2018-03-15 ENCOUNTER — Inpatient Hospital Stay (HOSPITAL_COMMUNITY): Payer: Medicaid Other

## 2018-03-15 LAB — BLOOD GAS, ARTERIAL
Acid-Base Excess: 13.7 mmol/L — ABNORMAL HIGH (ref 0.0–2.0)
Bicarbonate: 39.3 mmol/L — ABNORMAL HIGH (ref 20.0–28.0)
Drawn by: 347621
O2 Content: 12 L/min
O2 Saturation: 97.4 %
PCO2 ART: 67.1 mmHg — AB (ref 32.0–48.0)
Patient temperature: 98.8
pH, Arterial: 7.386 (ref 7.350–7.450)
pO2, Arterial: 95.6 mmHg (ref 83.0–108.0)

## 2018-03-15 LAB — GLUCOSE, CAPILLARY
GLUCOSE-CAPILLARY: 143 mg/dL — AB (ref 70–99)
Glucose-Capillary: 170 mg/dL — ABNORMAL HIGH (ref 70–99)
Glucose-Capillary: 110 mg/dL — ABNORMAL HIGH (ref 70–99)
Glucose-Capillary: 118 mg/dL — ABNORMAL HIGH (ref 70–99)
Glucose-Capillary: 134 mg/dL — ABNORMAL HIGH (ref 70–99)

## 2018-03-15 LAB — POCT I-STAT 3, ART BLOOD GAS (G3+)
Acid-Base Excess: 12 mmol/L — ABNORMAL HIGH (ref 0.0–2.0)
Bicarbonate: 41.8 mmol/L — ABNORMAL HIGH (ref 20.0–28.0)
O2 Saturation: 90 %
Patient temperature: 97.3
TCO2: 44 mmol/L — ABNORMAL HIGH (ref 22–32)
pCO2 arterial: 74.2 mmHg (ref 32.0–48.0)
pH, Arterial: 7.355 (ref 7.350–7.450)
pO2, Arterial: 62 mmHg — ABNORMAL LOW (ref 83.0–108.0)

## 2018-03-15 LAB — BASIC METABOLIC PANEL
Anion gap: 13 (ref 5–15)
BUN: 15 mg/dL (ref 6–20)
CO2: 34 mmol/L — ABNORMAL HIGH (ref 22–32)
Calcium: 9.6 mg/dL (ref 8.9–10.3)
Chloride: 97 mmol/L — ABNORMAL LOW (ref 98–111)
Creatinine, Ser: 0.64 mg/dL (ref 0.61–1.24)
GFR calc Af Amer: >60 mL/min (ref >60)
GFR calc non Af Amer: >60 mL/min (ref >60)
Glucose, Bld: 121 mg/dL — ABNORMAL HIGH (ref 70–99)
POTASSIUM: 4 mmol/L (ref 3.5–5.1)
Sodium: 144 mmol/L (ref 135–145)

## 2018-03-15 LAB — PROTIME-INR
INR: 1.01
PROTHROMBIN TIME: 13.2 s (ref 11.4–15.2)

## 2018-03-15 LAB — MAGNESIUM: Magnesium: 1.8 mg/dL (ref 1.7–2.4)

## 2018-03-15 LAB — BRAIN NATRIURETIC PEPTIDE: B Natriuretic Peptide: 292.8 pg/mL — ABNORMAL HIGH (ref 0.0–100.0)

## 2018-03-15 MED ORDER — CLONAZEPAM 0.5 MG PO TBDP: 0.5000 mg | ORAL_TABLET | Freq: Two times a day (BID) | ORAL | Status: DC

## 2018-03-15 MED ORDER — CLONAZEPAM 0.5 MG PO TBDP
0.5000 mg | ORAL_TABLET | Freq: Two times a day (BID) | ORAL | Status: DC
  Administered 2018-03-15 – 2018-03-24 (×16): 0.5 mg
  Filled 2018-03-15 (×16): qty 1

## 2018-03-15 MED ORDER — INFLUENZA VAC SPLIT QUAD 0.5 ML IM SUSY: 0.5000 mL | PREFILLED_SYRINGE | INTRAMUSCULAR | Status: DC | PRN

## 2018-03-15 MED ORDER — LORAZEPAM 2 MG/ML IJ SOLN
INTRAMUSCULAR | Status: AC
  Administered 2018-03-15: 0.5 mg via INTRAVENOUS
  Filled 2018-03-15: qty 1

## 2018-03-15 MED ORDER — LORAZEPAM 2 MG/ML IJ SOLN
0.5000 mg | Freq: Once | INTRAMUSCULAR | Status: AC
  Administered 2018-03-15: 0.5 mg via INTRAVENOUS

## 2018-03-15 MED ORDER — DEXMEDETOMIDINE HCL IN NACL 400 MCG/100ML IV SOLN
0.4000 ug/kg/h | INTRAVENOUS | Status: DC
  Administered 2018-03-16: 1 ug/kg/h via INTRAVENOUS
  Administered 2018-03-16 (×2): 0.6 ug/kg/h via INTRAVENOUS
  Administered 2018-03-17: 0.4 ug/kg/h via INTRAVENOUS
  Administered 2018-03-17: 0.8 ug/kg/h via INTRAVENOUS
  Filled 2018-03-15 (×5): qty 100

## 2018-03-15 MED ORDER — CLONAZEPAM 0.1 MG/ML ORAL SUSPENSION: 0.5000 mg | Freq: Two times a day (BID) | ORAL | Status: DC

## 2018-03-15 MED ORDER — RISPERIDONE 0.5 MG PO TABS
0.5000 mg | ORAL_TABLET | Freq: Two times a day (BID) | ORAL | Status: DC
  Administered 2018-03-15 – 2018-04-01 (×32): 0.5 mg
  Filled 2018-03-15 (×36): qty 1

## 2018-03-15 NOTE — Progress Notes (Signed)
eLink Physician-Brief Progress Note Patient Name: Brian [name] DOB: 1955/09/06 MRN: [id number]   Date of Service  03/15/2018  HPI/Events of Note  Pt quite agitated, pulling off his oyygen mask despite requiring 12 liters high flow o2 to maintain his saturation. The concern is for ETOH withdrawal. Sats have been right around 90 %.  eICU Interventions  Resume Precedex infusion-titrate gradually as necessary , will check a cxr, N-Peptide and an BMET        [name] U Kristof Nadeem 03/15/2018, 1:46 AM

## 2018-03-15 NOTE — Progress Notes (Signed)
PT Cancellation Note  Patient Details Name: Brian [name] MRN: [id number] DOB: Oct 26, 1955   Cancelled Treatment:    Reason Eval/Treat Not Completed: Patient's level of consciousness. Per discussion with RN, pt with decreased responsiveness and not appropriate for therapy today.  Laurina Bustle, PT, DPT Acute Rehabilitation Services Pager [phone] Office [phone]    Vanetta Mulders 03/15/2018, 10:51 AM

## 2018-03-15 NOTE — Progress Notes (Signed)
Critical ABG results given to RN.  

## 2018-03-15 NOTE — Progress Notes (Signed)
NAME:  Brian [name], MRN:  [id number], DOB:  11-Sep-1955, LOS: 7 ADMISSION DATE:  02/08/2018, CONSULTATION DATE:  03/01/2018 REFERRING MD:  Lamont Snowball - ED, CHIEF COMPLAINT:  Acute respiratory failure.   HPI/course in hospital  62 year old man who was intubated and transferred from [facility] for hypercapneic respiratory failure.   Past Medical History  He,  has a past medical history of Alcohol abuse, Arthritis, Cardiomyopathy (HCC), CHF (congestive heart failure) (HCC) with EF 25%. ,Cocaine abuse (HCC), COPD (chronic obstructive pulmonary disease) (HCC), Heart failure, Homelessness, Noncompliance with multiple trips to hospital before leaving AMA  , Paroxysmal VT (HCC), Suicide attempt (HCC), and Tobacco abuse.   Interim history/subjective:  He was active overnight, Precedex was restarted and titrated as high as 1.6.  This morning reportedly he was still interacting, able to speak with the staff.  Slowly through the morning he has become less and less responsive now very difficult wake.  His Precedex has been titrated to off   Objective   Blood pressure 103/67, pulse 75, temperature 98.8 F (37.1 C), temperature source Oral, resp. rate (!) 27, height 5\' 7"  (1.702 m), weight 81.4 kg, SpO2 99 %.        Intake/Output Summary (Last 24 hours) at 03/15/2018 0944 Last data filed at 03/15/2018 0800 Gross per 24 hour  Intake 2415.41 ml  Output 1740 ml  Net 675.41 ml   Filed Weights   03/13/18 0500 03/14/18 0500 03/15/18 0500  Weight: 82.3 kg 82.3 kg 81.4 kg   General: Chronically ill-appearing man, disheveled Neuro: Very difficult to wake, grimace with pain, he did finally wake up enough to try to speak with suctioning HEENT: Edentulous, oropharynx clear, pupils equal and react Neck: No stridor, no secretions this is improved) Lungs: Very distant, basal lateral inspiratory crackles, no wheezing CV: Regular, heart rate 60s to 70s Abd: Soft, nondistended, positive bowel sounds Ext: No  significant edema   Examination: Vitals:   03/15/18 0600 03/15/18 0700 03/15/18 0800 03/15/18 0822  BP: 122/82 126/79 103/67   Pulse: 82 93 75   Resp: (!) 32 (!) 33 (!) 27   Temp:      TempSrc:      SpO2: 100% 98% 100% 99%  Weight:      Height:         Ancillary tests (personally reviewed)  CBC: Recent Labs  Lab 03/09/18 0441 03/10/18 0346 03/11/18 0406 03/12/18 0306 03/13/18 0534  WBC 15.6* 15.2* 11.8* 9.9 9.5  HGB 15.6 15.3 15.5 15.8 15.1  HCT 51.0 50.6 51.2 52.9* 51.5  MCV 101.2* 102.2* 102.0* 102.7* 102.8*  PLT 194 177 183 137* 195    Basic Metabolic Panel: Recent Labs  Lab 03/11/18 0406 03/12/18 0306 03/13/18 0534 03/13/18 1304 03/13/18 1924 03/14/18 0421 03/14/18 2313 03/15/18 0250  NA 141 141 146*  --   --  142  --  144  K 4.3 4.6 4.6  --   --  3.9  --  4.0  CL 97* 99 104  --   --  101  --  97*  CO2 39* 28 26  --   --  30  --  34*  GLUCOSE 118* 116* 88  --   --  112*  --  121*  BUN 26* 30* 27*  --   --  25*  --  15  CREATININE 1.05 1.03 0.70  --   --  0.56*  --  0.64  CALCIUM 9.4 9.7 9.9  --   --  9.6  --  9.6  MG 2.3 2.0 1.8 1.7 1.6* 1.6* 1.9 1.8  PHOS 3.6  --   --  3.2 2.9 5.8* 4.0  --    GFR: Estimated Creatinine Clearance: 100.3 mL/min (by C-G formula based on SCr of 0.64 mg/dL). Recent Labs  Lab 03/06/2018 1950  03/10/18 0346 03/11/18 0406 03/12/18 0306 03/13/18 0534  WBC  --    < > 15.2* 11.8* 9.9 9.5  LATICACIDVEN 1.5  --   --   --   --   --    < > = values in this interval not displayed.    Liver Function Tests: No results for input(s): AST, ALT, ALKPHOS, BILITOT, PROT, ALBUMIN in the last 168 hours. No results for input(s): LIPASE, AMYLASE in the last 168 hours. No results for input(s): AMMONIA in the last 168 hours.  ABG - compensated repiratory acidosis.     Component Value Date/Time   PHART 7.386 03/15/2018 0340   PCO2ART 67.1 (HH) 03/15/2018 0340   PO2ART 95.6 03/15/2018 0340   HCO3 39.3 (H) 03/15/2018 0340   TCO2 39  (H) 03/11/2018 0322   ACIDBASEDEF 1.0 12/15/2017 1614   O2SAT 97.4 03/15/2018 0340      CBG: Recent Labs  Lab 03/14/18 1618 03/14/18 2002 03/14/18 2349 03/15/18 0347 03/15/18 0820  GLUCAP 121* 129* 116* 143* 170*   Microbiology:  + Rhinovirus. - MRSA, respiratory culture H. Influenzae - B-lactamase negative.   CXR 12/2: hyperinflation with no clear infiltrate or signs of pulmonary edema.  Assessment & Plan:  Acute respiratory failure requiring mechanical ventilation - extubated 03/12/18 Cause of respiratory failure appears to be AECOPD +/- superimposed LRTI - viral panel + for rhinovirus.  Now at some risk for respiratory failure due to encephalopathy, suppressed mental status (in part medications) Consider PE in setting DVT Sedation held.  Will need to adjust carefully as I suspect this is contributing to respiratory suppression currently.  He is at risk for reintubation Repeat ABG now Okay for NT suctioning if needed Continue bronchodilators as ordered Ceftriaxone day #5, empiric for possible CAP   Possible HF component as BNP has been climbing, although examination not suggestive.  Sinus tachycardia, question due to volume status, possibly withdrawal Goal volume status even to slightly net negative Diuresis as he can tolerate but need to try to avoid a contraction alkalosis given his baseline chronic hypercapnic respiratory failure  Encephalopathy, agitated delirium requiring titration of sedating medications.  Currently oversedated, obtunded Precedex off Hold clonazepam and other sedating medications Consider head CT if he does not clear with discontinuation of his sedation  R femoral DVT with suspected pulmonary embolism. Anticoagulation with enoxaparin  Tobacco abuse Bronchodilators as ordered  Dysphagia.  Failed swallow evaluation Feeding tube in place for meds and nutrition   Best practice:  Diet: SBFT  Pain/Anxiety/Delirium protocol (if indicated): Stop  Precedex, stop clonazepam now VAP protocol (if indicated): n/a DVT prophylaxis: Enoxaparin GI prophylaxis: d/c Glucose control: euglycemic with no coverage. Mobility: Bed rest Code Status: Full - will need to discuss in light of patient's non-compliance. Family Communication: No family present 12/5 Disposition: ICU   Critical care time: 102 minutes     Levy Pupa, MD, PhD 03/15/2018, 9:44 AM [facility] [phone] or if no answer [phone]

## 2018-03-15 NOTE — Progress Notes (Signed)
SLP Cancellation Note  Patient Details Name: NERICK HILLSON MRN: [id number] DOB: [birth year]   Cancelled treatment:        Per notes in chart, pt is poorly responsive this morning. Will continue to follow for safest and most appropriate intervention for swallow.    Royce Macadamia 03/15/2018, 11:13 AM   Breck Coons Lonell Face.Ed Nurse, children's [phone] Office [phone]

## 2018-03-15 NOTE — Progress Notes (Signed)
eLink Physician-Brief Progress Note Patient Name: Brian [name] DOB: June 23, 1955 MRN: [id number]   Date of Service  03/15/2018  HPI/Events of Note  Extremely agitated-literally climbing out of bed when I went into his room via camera to assess him. He is at high risk for falling.  eICU Interventions  Precedex infusion targeting a RAS of 0 to -2         Trenise Turay U Layman Gully 03/15/2018, 11:34 PM

## 2018-03-15 NOTE — Progress Notes (Signed)
ANTICOAGULATION CONSULT NOTE   Pharmacy Consult for Lovenox Indication: DVT  No Known Allergies  Patient Measurements: Height: 5\' 7"  (170.2 cm) Weight: 179 lb 7.3 oz (81.4 kg) IBW/kg (Calculated) : 66.1 Heparin Dosing Weight: 85kg  Vital Signs: Temp: 98.8 F (37.1 C) (12/06 0400) Temp Source: Oral (12/06 0400) BP: 103/67 (12/06 0800) Pulse Rate: 75 (12/06 0800)  Labs: Recent Labs    03/13/18 0534 03/14/18 0421 03/15/18 0250  HGB 15.1  --   --   HCT 51.5  --   --   PLT 195  --   --   LABPROT 12.5 12.8 13.2  INR 0.95 0.97 1.01  CREATININE 0.70 0.56* 0.64    Estimated Creatinine Clearance: 100.3 mL/min (by C-G formula based on SCr of 0.64 mg/dL).  Assessment: 62yom transfered from [facility] intubated and sedated for respiratory distress and hypotension.  New + DVT started on heparin, then switched to full-dose Lovenox.  No overt bleeding or complications noted, CBC stable.  Goal of Therapy:  Anti Xa level 0.6-1.2 Monitor platelets by anticoagulation protocol: Yes   Plan:  Continue Lovenox 80 mg sq q 12 hrs. CBC q 72 hrs F/u for plans for oral anticoagulation as able.  Jenetta Downer, [facility] Clinical Pharmacist Phone [phone]  03/15/2018 10:38 AM

## 2018-03-15 NOTE — NC FL2 (Addendum)
[place] MEDICAID FL2 LEVEL OF CARE SCREENING TOOL     IDENTIFICATION  Patient Name: Brian [name] Birthdate: [birth year] Sex: male Admission Date (Current Location): 03/09/2018  [facility] and [place] Number:  Producer, television/film/video and Address:  [facility] [place], [address], [place], [place] [address]      Provider Number: [id number]  Attending Physician Name and Address:  Lynnell Catalan, MD  Relative Name and Phone Number:       Current Level of Care: Hospital Recommended Level of Care: Skilled Nursing Facility Prior Approval Number:    Date Approved/Denied:   PASRR Number: [id number]  Discharge Plan: SNF    Current Diagnoses: Patient Active Problem List   Diagnosis Date Noted  . Respiratory failure (HCC) 03/01/2018  . Precordial chest pain   . Neck pain on left side 02/24/2018  . Left arm pain   . Left shoulder pain 02/23/2018  . Prolonged Q-T interval on ECG 01/06/2018  . Pain in right knee 01/06/2018  . Atypical chest pain 01/05/2018  . Hypokalemia 01/05/2018  . Acute on chronic combined systolic and diastolic CHF (congestive heart failure) (HCC) 01/05/2018  . Alcohol abuse 01/05/2018  . Pulmonary contusion 01/01/2018  . COPD exacerbation (HCC) 12/15/2017  . Acute respiratory failure with hypoxia (HCC) 11/13/2017  . Goals of care, counseling/discussion   . Palliative care by specialist   . Acute on chronic systolic CHF (congestive heart failure), NYHA class 4 (HCC) 11/03/2017  . Hyperglycemia 11/03/2017  . Acute respiratory failure with hypoxia and hypercapnia (HCC) 10/26/2017  . Acute on chronic respiratory failure with hypoxia (HCC) 09/27/2017  . Tobacco dependence 09/27/2017  . Chronic systolic CHF (congestive heart failure) (HCC) 09/25/2017  . COPD with acute exacerbation (HCC) 09/30/2015  . Substance induced mood disorder (HCC) 02/04/2013  . Polysubstance (excluding opioids) dependence, daily use (HCC) 02/03/2013  . Alcohol  dependence (HCC) 02/03/2013  . Gout attack 11/12/2012  . Closed fracture of 5th metacarpal 11/12/2012  . Chest pain 08/27/2012  . Acute exacerbation of chronic obstructive pulmonary disease (COPD) (HCC)   . Homelessness   . Cocaine abuse (HCC)     Orientation RESPIRATION BLADDER Height & Weight     Self  Trach, cuffed External catheter, Incontinent(placed 03/13/18) Weight: 179 lb 7.3 oz (81.4 kg) Height:  5\' 7"  (170.2 cm)  BEHAVIORAL SYMPTOMS/MOOD NEUROLOGICAL BOWEL NUTRITION STATUS      Continent Diet(NPO at this time)  AMBULATORY STATUS COMMUNICATION OF NEEDS Skin   Extensive Assist Verbally Normal                       Personal Care Assistance Level of Assistance  Bathing, Dressing, Feeding Bathing Assistance: Maximum assistance Feeding assistance: Limited assistance Dressing Assistance: Maximum assistance     Functional Limitations Info  Hearing, Speech, Sight Sight Info: Adequate Hearing Info: Adequate Speech Info: Adequate    SPECIAL CARE FACTORS FREQUENCY  PT (By licensed PT), OT (By licensed OT)     PT Frequency: 2x OT Frequency: 2x            Contractures Contractures Info: Not present    Additional Factors Info  Code Status, Allergies Code Status Info: Full Code Allergies Info: No known allergies           Current Medications (03/15/2018):  This is the current hospital active medication list Current Facility-Administered Medications  Medication Dose Route Frequency Provider Last Rate Last Dose  . acetaminophen (TYLENOL) tablet 650 mg  650 mg Oral Q4H PRN Dover, Levi Aland, MD   650 mg at 03/14/18 1451  . albuterol (PROVENTIL) (2.5 MG/3ML) 0.083% nebulizer solution 2.5 mg  2.5 mg Nebulization Q2H PRN Dover, Kenton L, MD      . aspirin chewable tablet 81 mg  81 mg Per Tube Daily Agarwala, Daleen Bo, MD   81 mg at 03/15/18 1021  . atorvastatin (LIPITOR) tablet 80 mg  80 mg Per Tube q1800 Lynnell Catalan, MD   80 mg at 03/13/18 1747  . cefTRIAXone  (ROCEPHIN) 2 g in sodium chloride 0.9 % 100 mL IVPB  2 g Intravenous Q24H Agarwala, Ravi, MD 200 mL/hr at 03/15/18 1233 2 g at 03/15/18 1233  . chlorhexidine (PERIDEX) 0.12 % solution 15 mL  15 mL Mouth Rinse BID Lynnell Catalan, MD   15 mL at 03/14/18 2242  . clonazePAM (KLONOPIN) disintegrating tablet 0.5 mg  0.5 mg Per Tube BID Lynnell Catalan, MD      . dextrose 5 % solution   Intravenous Continuous Karl Ito, MD 30 mL/hr at 03/15/18 1236    . enoxaparin (LOVENOX) injection 80 mg  80 mg Subcutaneous Q12H Earnie Larsson, RPH   80 mg at 03/15/18 1018  . feeding supplement (JEVITY 1.5 CAL/FIBER) liquid 1,000 mL  1,000 mL Per Tube Continuous Lynnell Catalan, MD 55 mL/hr at 03/14/18 1900    . feeding supplement (PRO-STAT SUGAR FREE 64) liquid 30 mL  30 mL Per Tube Daily Lynnell Catalan, MD   30 mL at 03/15/18 0901  . fentaNYL (SUBLIMAZE) injection 50 mcg  50 mcg Intravenous Q1H PRN Dover, Levi Aland, MD   50 mcg at 03/14/18 2040  . folic acid (FOLVITE) tablet 1 mg  1 mg Per Tube Daily Coralyn Helling, MD   1 mg at 03/15/18 0901  . Influenza vac split quadrivalent PF (FLUARIX) injection 0.5 mL  0.5 mL Intramuscular Prior to discharge Agarwala, Daleen Bo, MD      . ipratropium-albuterol (DUONEB) 0.5-2.5 (3) MG/3ML nebulizer solution 3 mL  3 mL Nebulization QID Leslye Peer, MD   3 mL at 03/15/18 1530  . MEDLINE mouth rinse  15 mL Mouth Rinse q12n4p Agarwala, Ravi, MD   15 mL at 03/15/18 1237  . nicotine (NICODERM CQ - dosed in mg/24 hours) patch 21 mg  21 mg Transdermal Daily Dover, Levi Aland, MD   21 mg at 03/15/18 0902  . ondansetron (ZOFRAN) injection 4 mg  4 mg Intravenous Q6H PRN Alyson Reedy, MD      . risperiDONE (RISPERDAL) tablet 0.5 mg  0.5 mg Per Tube BID Leslye Peer, MD      . thiamine (VITAMIN B-1) tablet 100 mg  100 mg Per Tube Daily Coralyn Helling, MD   100 mg at 03/15/18 0901     Discharge Medications: Please see discharge summary for a list of discharge medications.  Relevant  Imaging Results:  Relevant Lab Results:   Additional Information SSN: [id number]  Maree Krabbe, LCSW

## 2018-03-16 LAB — BASIC METABOLIC PANEL
Anion gap: 11 (ref 5–15)
BUN: 19 mg/dL (ref 6–20)
CO2: 34 mmol/L — ABNORMAL HIGH (ref 22–32)
Calcium: 9.7 mg/dL (ref 8.9–10.3)
Chloride: 97 mmol/L — ABNORMAL LOW (ref 98–111)
Creatinine, Ser: 0.65 mg/dL (ref 0.61–1.24)
GFR calc non Af Amer: >60 mL/min (ref >60)
Glucose, Bld: 128 mg/dL — ABNORMAL HIGH (ref 70–99)
Potassium: 4.3 mmol/L (ref 3.5–5.1)
Sodium: 142 mmol/L (ref 135–145)

## 2018-03-16 LAB — CBC
HCT: 53.8 % — ABNORMAL HIGH (ref 39.0–52.0)
Hemoglobin: 15.8 g/dL (ref 13.0–17.0)
MCH: 29.9 pg (ref 26.0–34.0)
MCHC: 29.4 g/dL — ABNORMAL LOW (ref 30.0–36.0)
MCV: 101.7 fL — ABNORMAL HIGH (ref 80.0–100.0)
Platelets: 201 10*3/uL (ref 150–400)
RBC: 5.29 MIL/uL (ref 4.22–5.81)
RDW: 12.4 % (ref 11.5–15.5)
WBC: 11.8 10*3/uL — AB (ref 4.0–10.5)
nRBC: 0 % (ref 0.0–0.2)

## 2018-03-16 LAB — GLUCOSE, CAPILLARY
Glucose-Capillary: 119 mg/dL — ABNORMAL HIGH (ref 70–99)
Glucose-Capillary: 126 mg/dL — ABNORMAL HIGH (ref 70–99)
Glucose-Capillary: 130 mg/dL — ABNORMAL HIGH (ref 70–99)
Glucose-Capillary: 141 mg/dL — ABNORMAL HIGH (ref 70–99)
Glucose-Capillary: 144 mg/dL — ABNORMAL HIGH (ref 70–99)
Glucose-Capillary: 99 mg/dL (ref 70–99)

## 2018-03-16 LAB — PROTIME-INR
INR: 1.02
Prothrombin Time: 13.3 seconds (ref 11.4–15.2)

## 2018-03-16 MED ORDER — LORAZEPAM 2 MG/ML IJ SOLN
0.5000 mg | INTRAMUSCULAR | Status: DC | PRN
  Administered 2018-03-16 – 2018-03-23 (×8): 1 mg via INTRAVENOUS
  Filled 2018-03-16 (×8): qty 1

## 2018-03-16 NOTE — Progress Notes (Signed)
SLP Cancellation Note  Patient Details Name: EIVIN RACHOW MRN: [id number] DOB: [birth year]   Cancelled treatment:       Reason Eval/Treat Not Completed: Medical issues which prohibited therapy; pt with decreased mentation and currently on 15L HFNC with labored breathing noted at rest; ST will continue efforts prn in acute setting.   Tressie Stalker, M.S., CCC-SLP 03/16/2018, 8:51 AM

## 2018-03-16 NOTE — Progress Notes (Signed)
RT NOTES: CPT and breathing treatment held. Patient asleep and does not want to be bothered

## 2018-03-16 NOTE — Progress Notes (Signed)
CPT held overnight. Beginning of shift, pt was extremely agitated, sitting on side of bed, trying to get up. Pt finally resting comfortably at this time. RT will continue to monitor.

## 2018-03-16 NOTE — Progress Notes (Addendum)
Pt extremely agitated an combative. MD paged for new orders. Pt trying to unsafely stand and ambulate against staff's advice. Pt is extremely high fall risk. Therapeutic communication and redirection attempted multiple times with staff without any results. Pt using profanity and threatening staff. New orders given for Precedex with RAS of -2. Pt in now resting in bed with RAS of -1 on maximum prescribed dose. There is still some impulsive behavior but improved. Will continue to monitor and update as needed. Current vitals HR 110. o2 97% [id number], b/p 116/85 (96) Respirations 21.

## 2018-03-16 NOTE — Progress Notes (Signed)
RT NOTES: Patient refused breathing treatment and CPT at this time. Pt agitated and requesting water.

## 2018-03-16 NOTE — Progress Notes (Signed)
NAME:  Brian [name], MRN:  [id number], DOB:  04-20-1955, LOS: 8 ADMISSION DATE:  02/09/2018, CONSULTATION DATE:  02/26/2018 REFERRING MD:  Lamont Snowball - ED, CHIEF COMPLAINT:  Acute respiratory failure.   HPI/course in hospital  62 year old man who was intubated and transferred from [facility] for hypercapneic respiratory failure.   Past Medical History  He,  has a past medical history of Alcohol abuse, Arthritis, Cardiomyopathy (HCC), CHF (congestive heart failure) (HCC) with EF 25%. ,Cocaine abuse (HCC), COPD (chronic obstructive pulmonary disease) (HCC), Heart failure, Homelessness, Noncompliance with multiple trips to hospital before leaving AMA  , Paroxysmal VT (HCC), Suicide attempt (HCC), and Tobacco abuse.   Significant tests/ events reviewed 12/5 Precedex was restarted and titrated as high as 1.6.  Interim history/subjective:   Remains on Precedex for intermittent agitation. He is on high flow nasal cannula. Afebrile  Objective   Blood pressure 93/65, pulse 64, temperature 98.7 F (37.1 C), temperature source Oral, resp. rate (!) 31, height 5\' 7"  (1.702 m), weight 82.2 kg, SpO2 99 %.        Intake/Output Summary (Last 24 hours) at 03/16/2018 1417 Last data filed at 03/16/2018 1200 Gross per 24 hour  Intake 1514 ml  Output 475 ml  Net 1039 ml   Filed Weights   03/14/18 0500 03/15/18 0500 03/16/18 0412  Weight: 82.3 kg 81.4 kg 82.2 kg   General: Chronically ill-appearing man, disheveled Neuro: Awake, lethargic, interactive, was able to tell me his name and that he is in the hospital HEENT: Edentulous, no pallor icterus Neck: No stridor, no JVD Lungs: Fine crackles right base, no rhonchi CV: Regular S1-S2 Abd: Soft, nondistended, positive bowel sounds Ext: No significant edema   Examination: Vitals:   03/16/18 1000 03/16/18 1100 03/16/18 1200 03/16/18 1311  BP: 109/70 106/62 93/65   Pulse: 80 79 64   Resp: (!) 30 (!) 23 (!) 31   Temp: 98.7 F (37.1 C)       TempSrc: Oral     SpO2: 95% 98% 100% 99%  Weight:      Height:        Microbiology:  + Rhinovirus. - MRSA, respiratory culture H. Influenzae - B-lactamase negative.   Chest x-ray 12/6 personally reviewed which shows bibasilar airspace disease  Assessment & Plan:  Acute respiratory failure requiring mechanical ventilation - extubated 03/12/18 Cause of respiratory failure appears to be AECOPD +/- superimposed LRTI - viral panel + for rhinovirus.   Consider PE in setting DVT  Continue to dial down oxygen, no steroids needed since he does not have bronchospasm Continue bronchodilators as ordered Ceftriaxone day # 6/7 , empiric for possible CAP  Acute on chronic systolic heart failure-EF 35% Possible HF component as BNP has been climbing, although examination not suggestive.   Goal volume status even to slightly net negative   Acute Encephalopathy, agitated delirium requiring titration of sedating medications.  Currently optimal  Precedex prn -can be off daytime Ct clonazepam , use Ativan per CIWA protocol DC Risperdal in 24 hours  R femoral DVT with suspected pulmonary embolism. Anticoagulation with enoxaparin  Tobacco abuse Bronchodilators as ordered  Dysphagia.   Feeding tube in place for meds and nutrition Needs repeat swallow evaluation at some point   The patient is critically ill with multiple organ systems failure and requires high complexity decision making for assessment and support, frequent evaluation and titration of therapies, application of advanced monitoring technologies and extensive interpretation of multiple databases. Critical Care Time devoted  to patient care services described in this note independent of APP/resident  time is 31 minutes.   Comer Locket Vassie Loll MD

## 2018-03-17 ENCOUNTER — Inpatient Hospital Stay (HOSPITAL_COMMUNITY): Payer: Medicaid Other

## 2018-03-17 ENCOUNTER — Inpatient Hospital Stay: Payer: Self-pay

## 2018-03-17 LAB — POCT I-STAT 3, ART BLOOD GAS (G3+)
Acid-Base Excess: 11 mmol/L — ABNORMAL HIGH (ref 0.0–2.0)
Bicarbonate: 40 mmol/L — ABNORMAL HIGH (ref 20.0–28.0)
O2 Saturation: 94 %
Patient temperature: 98.7
TCO2: 42 mmol/L — ABNORMAL HIGH (ref 22–32)
pCO2 arterial: 67.7 mmHg (ref 32.0–48.0)
pH, Arterial: 7.38 (ref 7.350–7.450)
pO2, Arterial: 76 mmHg — ABNORMAL LOW (ref 83.0–108.0)

## 2018-03-17 LAB — BLOOD GAS, ARTERIAL
ACID-BASE EXCESS: 16.7 mmol/L — AB (ref 0.0–2.0)
BICARBONATE: 44.9 mmol/L — AB (ref 20.0–28.0)
DRAWN BY: 819031
FIO2: 100
O2 Content: 15 L/min
O2 Saturation: 94.4 %
PO2 ART: 83.6 mmHg (ref 83.0–108.0)
Patient temperature: 98.7
pCO2 arterial: 115 mmHg (ref 32.0–48.0)
pH, Arterial: 7.216 — ABNORMAL LOW (ref 7.350–7.450)

## 2018-03-17 LAB — GLUCOSE, CAPILLARY
GLUCOSE-CAPILLARY: 92 mg/dL (ref 70–99)
Glucose-Capillary: 102 mg/dL — ABNORMAL HIGH (ref 70–99)
Glucose-Capillary: 103 mg/dL — ABNORMAL HIGH (ref 70–99)
Glucose-Capillary: 111 mg/dL — ABNORMAL HIGH (ref 70–99)
Glucose-Capillary: 120 mg/dL — ABNORMAL HIGH (ref 70–99)

## 2018-03-17 LAB — BASIC METABOLIC PANEL
Anion gap: 14 (ref 5–15)
BUN: 21 mg/dL — ABNORMAL HIGH (ref 6–20)
CO2: 32 mmol/L (ref 22–32)
CREATININE: 0.51 mg/dL — AB (ref 0.61–1.24)
Calcium: 9.9 mg/dL (ref 8.9–10.3)
Chloride: 95 mmol/L — ABNORMAL LOW (ref 98–111)
GFR calc Af Amer: >60 mL/min (ref >60)
GFR calc non Af Amer: >60 mL/min (ref >60)
Glucose, Bld: 130 mg/dL — ABNORMAL HIGH (ref 70–99)
Potassium: 5.3 mmol/L — ABNORMAL HIGH (ref 3.5–5.1)
Sodium: 141 mmol/L (ref 135–145)

## 2018-03-17 LAB — PROTIME-INR
INR: 0.93
Prothrombin Time: 12.4 seconds (ref 11.4–15.2)

## 2018-03-17 LAB — CBC
HCT: 58.2 % — ABNORMAL HIGH (ref 39.0–52.0)
Hemoglobin: 17.5 g/dL — ABNORMAL HIGH (ref 13.0–17.0)
MCH: 30.8 pg (ref 26.0–34.0)
MCHC: 30.1 g/dL (ref 30.0–36.0)
MCV: 102.5 fL — ABNORMAL HIGH (ref 80.0–100.0)
PLATELETS: 217 10*3/uL (ref 150–400)
RBC: 5.68 MIL/uL (ref 4.22–5.81)
RDW: 12.4 % (ref 11.5–15.5)
WBC: 11 10*3/uL — AB (ref 4.0–10.5)
nRBC: 0 % (ref 0.0–0.2)

## 2018-03-17 LAB — MAGNESIUM: Magnesium: 1.9 mg/dL (ref 1.7–2.4)

## 2018-03-17 LAB — PHOSPHORUS: Phosphorus: 4.1 mg/dL (ref 2.5–4.6)

## 2018-03-17 MED ORDER — ETOMIDATE 2 MG/ML IV SOLN
0.3000 mg/kg | Freq: Once | INTRAVENOUS | Status: AC
  Administered 2018-03-17: 20 mg via INTRAVENOUS

## 2018-03-17 MED ORDER — MIDAZOLAM HCL 2 MG/2ML IJ SOLN
2.0000 mg | Freq: Once | INTRAMUSCULAR | Status: AC
  Administered 2018-03-17: 2 mg via INTRAVENOUS

## 2018-03-17 MED ORDER — FENTANYL CITRATE (PF) 100 MCG/2ML IJ SOLN
100.0000 ug | Freq: Once | INTRAMUSCULAR | Status: AC
  Administered 2018-03-17: 100 ug via INTRAVENOUS

## 2018-03-17 MED ORDER — CHLORHEXIDINE GLUCONATE 0.12% ORAL RINSE (MEDLINE KIT): 15.0000 mL | Freq: Two times a day (BID) | OROMUCOSAL | Status: DC

## 2018-03-17 MED ORDER — DOCUSATE SODIUM 50 MG/5ML PO LIQD
100.0000 mg | Freq: Two times a day (BID) | ORAL | Status: DC | PRN
  Administered 2018-03-25 – 2018-03-26 (×2): 100 mg
  Filled 2018-03-17 (×2): qty 10

## 2018-03-17 MED ORDER — MIDAZOLAM HCL 2 MG/2ML IJ SOLN
INTRAMUSCULAR | Status: AC
  Filled 2018-03-17: qty 2

## 2018-03-17 MED ORDER — FENTANYL 2500MCG IN NS 250ML (10MCG/ML) PREMIX INFUSION
25.0000 ug/h | INTRAVENOUS | Status: DC
  Administered 2018-03-17: 50 ug/h via INTRAVENOUS
  Administered 2018-03-18: 100 ug/h via INTRAVENOUS
  Administered 2018-03-18: 150 ug/h via INTRAVENOUS
  Administered 2018-03-19: 100 ug/h via INTRAVENOUS
  Administered 2018-03-20: 150 ug/h via INTRAVENOUS
  Administered 2018-03-21: 200 ug/h via INTRAVENOUS
  Administered 2018-03-21: 150 ug/h via INTRAVENOUS
  Administered 2018-03-22 (×2): 200 ug/h via INTRAVENOUS
  Administered 2018-03-22: 150 ug/h via INTRAVENOUS
  Administered 2018-03-23 – 2018-03-24 (×4): 200 ug/h via INTRAVENOUS
  Administered 2018-03-25: 300 ug/h via INTRAVENOUS
  Administered 2018-03-25 – 2018-03-26 (×2): 200 ug/h via INTRAVENOUS
  Administered 2018-03-26 – 2018-03-27 (×2): 150 ug/h via INTRAVENOUS
  Filled 2018-03-17 (×19): qty 250

## 2018-03-17 MED ORDER — ORAL CARE MOUTH RINSE
15.0000 mL | OROMUCOSAL | Status: DC
  Administered 2018-03-17 – 2018-03-20 (×31): 15 mL via OROMUCOSAL

## 2018-03-17 MED ORDER — FENTANYL BOLUS VIA INFUSION
50.0000 ug | INTRAVENOUS | Status: DC | PRN
  Administered 2018-03-22 – 2018-03-24 (×4): 50 ug via INTRAVENOUS
  Filled 2018-03-17: qty 50

## 2018-03-17 MED ORDER — NOREPINEPHRINE 4 MG/250ML-% IV SOLN
0.0000 ug/min | INTRAVENOUS | Status: DC
  Administered 2018-03-17: 8 ug/min via INTRAVENOUS
  Administered 2018-03-19: 2 ug/min via INTRAVENOUS
  Filled 2018-03-17: qty 250

## 2018-03-17 MED ORDER — FENTANYL CITRATE (PF) 100 MCG/2ML IJ SOLN
50.0000 ug | Freq: Once | INTRAMUSCULAR | Status: DC
  Filled 2018-03-17: qty 2

## 2018-03-17 MED ORDER — FENTANYL CITRATE (PF) 100 MCG/2ML IJ SOLN
INTRAMUSCULAR | Status: AC
  Filled 2018-03-17: qty 2

## 2018-03-17 NOTE — Progress Notes (Signed)
eLink Physician-Brief Progress Note Patient Name: Brian [name] DOB: 1955-04-17 MRN: [id number]   Date of Service  03/17/2018  HPI/Events of Note  Fever to 99.3 F. Patient already on Ceftriaxone.  HR = 130. Tylenol given.   eICU Interventions  Will obtain blood cultures X 2.     Intervention Category Major Interventions: Infection - evaluation and management  Breiona Couvillon Eugene 03/17/2018, 6:51 AM

## 2018-03-17 NOTE — Progress Notes (Signed)
RT NOTES: Critical ABG results given to Dr Vassie Loll. MD at bedside. Patient intubated at this time and placed on ventilator PRVC 530/20/+8/100%.

## 2018-03-17 NOTE — Progress Notes (Signed)
Pt's SpO2 remains 88-89% with vent settings 530/20/100%/+8. Okay per Dr. Arsenio Loader to increase peep to 10cmH20 and do recruitment. Pt tolerated recruitment, SpO2 increased to 90-91%. RT will continue to monitor.

## 2018-03-17 NOTE — Progress Notes (Signed)
Pt woke up on ventilator and began swinging hands and feet at staff and slamming hands onto bedrails.  Therapist, nutritional at bedside.  Reorientation unsuccessful.  CCM paged. Restraints ordered and applied. Pt calm for now. Will continue to monitor.

## 2018-03-17 NOTE — Progress Notes (Signed)
RT NOTES: CPT held at this time d/t procedure.

## 2018-03-17 NOTE — Progress Notes (Signed)
eLink Physician-Brief Progress Note Patient Name: Brian [name] DOB: 12/01/1955 MRN: [id number]   Date of Service  03/17/2018  HPI/Events of Note  Request to review CXR from 8:57 PM for ETT position. ETT in trachea about 2.9-3.0 cm above carina.   eICU Interventions  ETT position is satisfactory.     Intervention Category Major Interventions: Other:  Lenell Antu 03/17/2018, 9:31 PM

## 2018-03-17 NOTE — Progress Notes (Signed)
RT NOTES: CPT held at this time d/t new intubation

## 2018-03-17 NOTE — Progress Notes (Signed)
NAME:  Brian [name], MRN:  [id number], DOB:  1956/03/07, LOS: 9 ADMISSION DATE:  04/01/2018, CONSULTATION DATE:  Apr 01, 2018 REFERRING MD:  Lamont Snowball - ED, CHIEF COMPLAINT:  Acute respiratory failure.   HPI/course in hospital  62 year old man who was intubated and transferred from [facility] for hypercapneic respiratory failure.   Past Medical History  He,  has a past medical history of Alcohol abuse, Arthritis, Cardiomyopathy (HCC), CHF (congestive heart failure) (HCC) with EF 25%. ,Cocaine abuse (HCC), COPD (chronic obstructive pulmonary disease) (HCC), Heart failure, Homelessness, Noncompliance with multiple trips to hospital before leaving AMA  , Paroxysmal VT (HCC), Suicide attempt (HCC), and Tobacco abuse.   Significant tests/ events reviewed 12/5 Precedex was restarted and titrated as high as 1.6. 12/7 on HFNC 15L, interactive on precedex  Interim history/subjective:  Febrile overnight In extremis since 6 am this morning , on NRB Diaphoretic   Objective   Blood pressure 126/62, pulse (!) 126, temperature 98.7 F (37.1 C), temperature source Oral, resp. rate (!) 32, height 5\' 7"  (1.702 m), weight 80.7 kg, SpO2 98 %.        Intake/Output Summary (Last 24 hours) at 03/17/2018 0805 Last data filed at 03/17/2018 0600 Gross per 24 hour  Intake 1801.35 ml  Output 775 ml  Net 1026.35 ml   Filed Weights   03/15/18 0500 03/16/18 0412 03/17/18 0400  Weight: 81.4 kg 82.2 kg 80.7 kg   General: acutely ill-appearing man, disheveled, older than age Neuro: less responsive, sticks his tongue out but does not follow other commands HEENT: Edentulous, no pallor icterus Neck: No stridor, no JVD Lungs: decreased BL, Fine crackles right base, no rhonchi CV: tachy S1-S2 Abd: Soft, nondistended, positive bowel sounds Ext: No significant edema   Examination: Vitals:   03/17/18 0300 03/17/18 0400 03/17/18 0500 03/17/18 0600  BP: 135/71 (!) 160/96 126/62   Pulse: 86 95 81 (!) 126    Resp: (!) 34 (!) 29 (!) 25 (!) 32  Temp:  98.7 F (37.1 C)    TempSrc:  Oral    SpO2: 96% (!) 88% 94% 98%  Weight:  80.7 kg    Height:        Microbiology:  + Rhinovirus. 12/2 respiratory culture >>proteus H. Influenzae - B-lactamase negative.   Chest x-ray 12/7 personally reviewed which shows improved bibasilar airspace disease  Assessment & Plan:  Acute hypercarbic respiratory failure requiring mechanical ventilation - extubated 03/12/18, but deterioration again 12/8  Cause of respiratory failure appears to be AECOPD +/- superimposed LRTI - viral panel + for rhinovirus.   Consider PE in setting DVT  Reintubate - PRVC vent settings Continue bronchodilators as ordered Ceftriaxone day # 7/10, empiric for possible CAP - use pct to guide duration  Acute on chronic systolic heart failure-EF 35%  Goal volume status even to slightly net negative Follow BNP   Acute Encephalopathy, agitated delirium requiring titration of sedating medications.  Currently optimal  Precedex + fent int, goal RASS -1 Ct clonazepam , use Ativan per CIWA protocol DC Risperdal in 24 hours  R femoral DVT with suspected pulmonary embolism. Anticoagulation with enoxaparin  Tobacco abuse Bronchodilators as ordered  Dysphagia.   Feeding tube in place for meds and nutrition   DIfficult situation - no advance directives here, had to reintubate for recurrent  hypercarbic failure   The patient is critically ill with multiple organ systems failure and requires high complexity decision making for assessment and support, frequent evaluation and titration of therapies,  application of advanced monitoring technologies and extensive interpretation of multiple databases. Critical Care Time devoted to patient care services described in this note independent of APP/resident  time is 35 minutes.    Comer Locket Vassie Loll MD  [phone]

## 2018-03-17 NOTE — Progress Notes (Signed)
RT called to assess pt for episodes of desaturation. Pt currently on 15L HFNC, increased from 10L HFNC due to SpO2 in 80's. PRN neb tx given, and CPT done at that time. Pt with strong, productive cough, returning tenacious white secretions. Pt also had to be NTS'd by RT due to breath sounds with increasing rhonchi/crackles. Pt tolerated well, but continued to sat around 88-89%. Pt placed on 100% NRB due to mouth breathing. SpO2 has increased to around 92% with RR around 30. Pt asleep and in no obvious respiratory distress at this time. RT will continue to monitor.

## 2018-03-17 NOTE — Progress Notes (Signed)
Upon am assessment pt found to be in resp. Distress. Pt on 15L NRB. RR 30-40, HR 130s.   Pt unable to follow commands and sweating profusely. CCM called to bedside to assess and pt rapidly intubated.  Pt now resting comfortably. HR 80s RR 20 on ventilator.  Will continue to monitor.

## 2018-03-17 NOTE — Progress Notes (Signed)
Patient HR sustaining in the 130's. MD notified. Patient febrile and tylenol given.   Will continue to monitor.   Hetty Linhart E Mylinda Latina, [place]

## 2018-03-18 ENCOUNTER — Encounter: Payer: Self-pay | Admitting: *Deleted

## 2018-03-18 ENCOUNTER — Inpatient Hospital Stay (HOSPITAL_COMMUNITY): Payer: Medicaid Other

## 2018-03-18 DIAGNOSIS — J810 Acute pulmonary edema: Secondary | ICD-10-CM

## 2018-03-18 DIAGNOSIS — R0902 Hypoxemia: Secondary | ICD-10-CM

## 2018-03-18 LAB — BASIC METABOLIC PANEL
Anion gap: 12 (ref 5–15)
BUN: 33 mg/dL — ABNORMAL HIGH (ref 6–20)
CO2: 36 mmol/L — ABNORMAL HIGH (ref 22–32)
Calcium: 9.5 mg/dL (ref 8.9–10.3)
Chloride: 96 mmol/L — ABNORMAL LOW (ref 98–111)
Creatinine, Ser: 0.83 mg/dL (ref 0.61–1.24)
GFR calc non Af Amer: >60 mL/min (ref >60)
Glucose, Bld: 117 mg/dL — ABNORMAL HIGH (ref 70–99)
Potassium: 3.8 mmol/L (ref 3.5–5.1)
Sodium: 144 mmol/L (ref 135–145)

## 2018-03-18 LAB — GLUCOSE, CAPILLARY
GLUCOSE-CAPILLARY: 168 mg/dL — AB (ref 70–99)
Glucose-Capillary: 109 mg/dL — ABNORMAL HIGH (ref 70–99)
Glucose-Capillary: 116 mg/dL — ABNORMAL HIGH (ref 70–99)
Glucose-Capillary: 128 mg/dL — ABNORMAL HIGH (ref 70–99)
Glucose-Capillary: 146 mg/dL — ABNORMAL HIGH (ref 70–99)

## 2018-03-18 LAB — PROTIME-INR
INR: 1.03
Prothrombin Time: 13.4 seconds (ref 11.4–15.2)

## 2018-03-18 LAB — MAGNESIUM: Magnesium: 2.1 mg/dL (ref 1.7–2.4)

## 2018-03-18 LAB — PHOSPHORUS: PHOSPHORUS: 2.8 mg/dL (ref 2.5–4.6)

## 2018-03-18 MED ORDER — SODIUM CHLORIDE 0.9% FLUSH: 10.0000 mL | INTRAVENOUS | Status: DC | PRN

## 2018-03-18 MED ORDER — SODIUM CHLORIDE 0.9 % IV BOLUS
500.0000 mL | Freq: Once | INTRAVENOUS | Status: AC
  Administered 2018-03-18: 500 mL via INTRAVENOUS

## 2018-03-18 MED ORDER — SODIUM CHLORIDE 0.9% FLUSH
10.0000 mL | Freq: Two times a day (BID) | INTRAVENOUS | Status: DC
  Administered 2018-03-18: 10 mL
  Administered 2018-03-18: 30 mL
  Administered 2018-03-19: 10 mL
  Administered 2018-03-19: 30 mL
  Administered 2018-03-20 – 2018-04-06 (×25): 10 mL

## 2018-03-18 MED ORDER — SODIUM CHLORIDE 3 % IN NEBU
4.0000 mL | INHALATION_SOLUTION | Freq: Two times a day (BID) | RESPIRATORY_TRACT | Status: AC
  Administered 2018-03-18 – 2018-03-20 (×6): 4 mL via RESPIRATORY_TRACT
  Filled 2018-03-18 (×7): qty 4

## 2018-03-18 MED ORDER — CHLORHEXIDINE GLUCONATE CLOTH 2 % EX PADS
6.0000 | MEDICATED_PAD | Freq: Every day | CUTANEOUS | Status: DC
  Administered 2018-03-18 – 2018-04-05 (×16): 6 via TOPICAL

## 2018-03-18 NOTE — Progress Notes (Signed)
Nutrition Follow-up  DOCUMENTATION CODES:   Not applicable  INTERVENTION:   Recommend resuming TF regimen as follows: Jevity 1.5 at 55 mL/hr ProStat 1x daily - provides 2080 kcal, 99 g protein, and 1003 mL free water - recommend additional 165 mL water flushes q 6 hrs to better meet hydration needs  Continue thiamine and folvite daily  NUTRITION DIAGNOSIS:   Inadequate oral intake related to inability to eat as evidenced by NPO status.  Ongoing  GOAL:   Patient will meet greater than or equal to 90% of their needs   MONITOR:   Vent status, Labs, I & O's, Weight trends, TF tolerance  REASON FOR ASSESSMENT:   Ventilator, Consult Enteral/tube feeding initiation and management  ASSESSMENT:   61 year old male who presented to [facility] ED on 11/29 with acute respiratory failure. Pt was intubated in the ED. PMH significant for COPD, CHF, substance use, tobacco use, and multiple prior hospitalizations with pt leaving AMA.  12/1 - pt with episode of emesis, TF held 12/3 - extubated 12/4 - NPO per SLP, Cortrak placed (tip is gastric) 12/8 - pt reintubated  Patient is currently intubated on ventilator support MV: 10.6 L/min Temp (24hrs), Avg:99.5 F (37.5 C), Min:99 F (37.2 C), Max:100.7 F (38.2 C)  Meds: Lipitor 80 mg daily, ProStat 1 pkt daily, fentanyl 10 mcg/mL infusion, folvite 1 mg daily, thiamine 100 mg daily, Jevity 1.5 at 55 mL/hr Labs: chloride 96, glucose 117, BUN 33  Pt wt down 1.1 kg since 12/4 and down 3.7 kg since admit on 11/29  Diet Order:   Diet Order            Diet NPO time specified  Diet effective now              EDUCATION NEEDS:  Not appropriate for education at this time  Skin:  Skin Assessment: Reviewed RN Assessment  Last BM:  12/4  Height:  Ht Readings from Last 1 Encounters:  Mar 25, 2018 5\' 7"  (1.702 m)    Weight:  Wt Readings from Last 1 Encounters:  03/18/18 81.2 kg    Ideal Body Weight:  67.3 kg  BMI:  Body mass  index is 28.04 kg/m.  Estimated Nutritional Needs:   Kcal:  2000-2200  Protein:  100-115 grams  Fluid:  >/= 1.7 L  Jolaine Artist, MS, RDN, LDN Pager: [phone]

## 2018-03-18 NOTE — Progress Notes (Signed)
SLP Cancellation Note  Patient Details Name: DAJUAN ELVIN MRN: [id number] DOB: Sep 16, 1955   Cancelled treatment:        On ventilator. Will follow.   Royce Macadamia 03/18/2018, 8:24 AM   Breck Coons Lonell Face.Ed Nurse, children's [phone] Office [phone]

## 2018-03-18 NOTE — Progress Notes (Signed)
eLink Physician-Brief Progress Note Patient Name: Brian [name] DOB: [birth year] MRN: [id number]   Date of Service  03/18/2018  HPI/Events of Note  Oliguria - Bladder scan only has about 200 mL of urine.   eICU Interventions  Will order: 1. Will bolus with 0.9 NaCl 500 mL IV over 30 minutes now.      Intervention Category Intermediate Interventions: Oliguria - evaluation and management  Jakayla Schweppe Eugene 03/18/2018, 3:33 AM

## 2018-03-18 NOTE — Progress Notes (Addendum)
NAME:  Brian [name], MRN:  [id number], DOB:  Jun 14, 1955, LOS: 10 ADMISSION DATE:  02/16/2018, CONSULTATION DATE:  03/02/2018 REFERRING MD:  Lamont Snowball - ED, CHIEF COMPLAINT:  Acute respiratory failure.   HPI/course in hospital  62 year old man who was intubated and transferred from [facility] for hypercapneic respiratory failure.   Past Medical History  He,  has a past medical history of Alcohol abuse, Arthritis, Cardiomyopathy (HCC), CHF (congestive heart failure) (HCC) with EF 25%. ,Cocaine abuse (HCC), COPD (chronic obstructive pulmonary disease) (HCC), Heart failure, Homelessness, Noncompliance with multiple trips to hospital before leaving AMA  , Paroxysmal VT (HCC), Suicide attempt (HCC), and Tobacco abuse.   Significant tests/ events reviewed 12/5 Precedex was restarted and titrated as high as 1.6. 12/7 on HFNC 15L, interactive on precedex 12/8 re-intubated for hypoxia (? atx vs PNA) 12/9 high peep/fio2. Slowly weaning  Interim history/subjective:  Slowly weaning FIO2   Objective   Blood pressure (Abnormal) 81/58, pulse 81, temperature 99.1 F (37.3 C), temperature source Oral, resp. rate 20, height 5\' 7"  (1.702 m), weight 81.2 kg, SpO2 91 %.    Vent Mode: PRVC FiO2 (%):  [90 %-100 %] 90 % Set Rate:  [20 bmp] 20 bmp Vt Set:  [530 mL] 530 mL PEEP:  [8 cmH20-10 cmH20] 10 cmH20 Plateau Pressure:  [14 cmH20-20 cmH20] 19 cmH20   Intake/Output Summary (Last 24 hours) at 03/18/2018 1140 Last data filed at 03/18/2018 0800 Gross per 24 hour  Intake 1050.41 ml  Output 130 ml  Net 920.41 ml   Filed Weights   03/16/18 0412 03/17/18 0400 03/18/18 0305  Weight: 82.2 kg 80.7 kg 81.2 kg   General:  62 year old male sedated on vent  HENT NCAT orally intubated.  Pulm clear w/ prolonged exhale phase  Card RRR, no MRG abd soft no pain no OM GU due to void.  Neuro awakens easily no focal def  Ext LE edema trace   Assessment & Plan:  Acute hypercarbic respiratory failure  requiring mechanical ventilation.  Cause of respiratory failure appears to be AECOPD +/- PNA w/ viral panel + for rhinovirus & proteus and H influenza  - extubated 03/12/18, but deterioration again 12/8  Consider PE in setting DVT PCXR ETT good position. Bibasilar volume loss.  -his FIO2 requirements seem out of proportion to his CXR changes.  Plan Cont full vent support; wean PEEP/FIO2 PAD protocol RASS goal 0 to -1 VAP bundle  Scheduled BDs Day 8/10 ceftriaxone; f/u repeat cultures  Cont LMWH for treatment of PE/DVT Adding HT saline neb->may need to consider CT imaging or bubble study in am if O2 requirements stay high (seems out of proportion to cxr changes)  Acute on chronic systolic heart failure-EF 35% Plan Keep euvolemic (doesn't look overloaded) Ck CVP Cont tele    Acute Encephalopathy, agitated delirium requiring titration of sedating medications.  Currently optimal  Plan PAD (RAS goal as above) Cont clonazepam and fent gtt at current recipe (no role to change this until O2 requirements down)  R femoral DVT with suspected pulmonary embolism. Plan Cont therapeutic LMWH    Dysphagia.   Plan Cont tubefeeds   Urinary retention Plan Place foley  DIfficult situation - no advance directives here, had to reintubate for recurrent  hypercarbic failure     Simonne Martinet ACNP-BC [facility] Pulmonary/Critical Care Pager # [phone] OR # [phone] if no answer  Attending Note:  62 year old male with COPD who was intubated and extubated to  fail extubation then reintubated.  On exam, decreased BS in all lung fields.  I reviewed CXR myself, ETT is in a good position.  Discussed with PCCM-NP.  Will continue active vent support for now.  If O2 and PEEP demand remain high will consider an echo with a bubble study.  Continue sedation as ordered.  Maintain as euvolemic as possible, appears euvolemic now.  Abx as ordered.  PCCM will continue to follow.  The patient is critically ill  with multiple organ systems failure and requires high complexity decision making for assessment and support, frequent evaluation and titration of therapies, application of advanced monitoring technologies and extensive interpretation of multiple databases.   Critical Care Time devoted to patient care services described in this note is  33  Minutes. This time reflects time of care of this signee Dr Koren Bound. This critical care time does not reflect procedure time, or teaching time or supervisory time of PA/NP/Med student/Med Resident etc but could involve care discussion time.  Alyson Reedy, M.D. [facility] Pulmonary/Critical Care Medicine. Pager: [phone]. After hours pager: [phone].

## 2018-03-18 NOTE — Progress Notes (Signed)
OT Cancellation Note  Patient Details Name: KAVISH LALOR MRN: [id number] DOB: 1955/07/06   Cancelled Treatment:    Reason Eval/Treat Not Completed: Medical issues which prohibited therapy(pt intubated and sedated on 03/17/18, will follow.)  Evern Bio 03/18/2018, 9:55 AM  Martie Round, OTR/L Acute Rehabilitation Services Pager: [phone] Office: [phone]

## 2018-03-18 NOTE — Progress Notes (Signed)
Peripherally Inserted Central Catheter/Midline Placement  The IV Nurse has discussed with the patient and/or persons authorized to consent for the patient, the purpose of this procedure and the potential benefits and risks involved with this procedure.  The benefits include less needle sticks, lab draws from the catheter, and the patient may be discharged home with the catheter. Risks include, but not limited to, infection, bleeding, blood clot (thrombus formation), and puncture of an artery; nerve damage and irregular heartbeat and possibility to perform a PICC exchange if needed/ordered by physician.  Alternatives to this procedure were also discussed.  Bard Power PICC patient education guide, fact sheet on infection prevention and patient information card has been provided to patient /or left at bedside.    PICC/Midline Placement Documentation  PICC Triple Lumen 03/18/18 PICC Right Basilic 44 cm 0 cm (Active)  Indication for Insertion or Continuance of Line Prolonged intravenous therapies 03/18/2018 12:35 PM  Exposed Catheter (cm) 0 cm 03/18/2018 12:35 PM  Site Assessment Clean;Dry;Intact 03/18/2018 12:35 PM  Lumen #1 Status Flushed;Blood return noted 03/18/2018 12:35 PM  Lumen #2 Status Flushed;Blood return noted 03/18/2018 12:35 PM  Lumen #3 Status Flushed;Blood return noted 03/18/2018 12:35 PM  Dressing Type Transparent 03/18/2018 12:35 PM  Dressing Status Clean;Dry;Intact;Antimicrobial disc in place 03/18/2018 12:35 PM  Dressing Intervention New dressing 03/18/2018 12:35 PM  Dressing Change Due 03/25/18 03/18/2018 12:35 PM   Emergent consesnt signed by Brian [name]    Brian [name] 03/18/2018, 12:38 PM

## 2018-03-18 NOTE — Progress Notes (Signed)
PT Cancellation Note  Patient Details Name: Brian [name] MRN: [id number] DOB: 11-Oct-1955   Cancelled Treatment:    Reason Eval/Treat Not Completed: Medical issues which prohibited therapy(intubated )   [name] 03/18/2018, 9:55 AM

## 2018-03-18 NOTE — Progress Notes (Signed)
ANTICOAGULATION CONSULT NOTE   Pharmacy Consult for Lovenox Indication: DVT  No Known Allergies  Patient Measurements: Height: 5\' 7"  (170.2 cm) Weight: 179 lb 0.2 oz (81.2 kg) IBW/kg (Calculated) : 66.1 Heparin Dosing Weight: 85kg  Vital Signs: Temp: 99.2 F (37.3 C) (12/09 0400) Temp Source: Oral (12/09 0400) BP: 80/62 (12/09 0600) Pulse Rate: 68 (12/09 0600)  Labs: Recent Labs    03/16/18 0445 03/17/18 0342 03/17/18 0633 03/18/18 0312  HGB 15.8 17.5*  --   --   HCT 53.8* 58.2*  --   --   PLT 201 217  --   --   LABPROT 13.3  --  12.4 13.4  INR 1.02  --  0.93 1.03  CREATININE 0.65 0.51*  --  0.83    Estimated Creatinine Clearance: 96.5 mL/min (by C-G formula based on SCr of 0.83 mg/dL).  Assessment: 62yom transfered from [facility] intubated and sedated for respiratory distress and hypotension.  New + DVT started on heparin, then switched to full-dose Lovenox.  No overt bleeding or complications noted, CBC stable.  Goal of Therapy:  Anti Xa level 0.6-1.2 Monitor platelets by anticoagulation protocol: Yes   Plan:  Continue Lovenox 80 mg sq q 12 hrs. CBC q 72 hrs F/u for plans for oral anticoagulation as able.  Jenetta Downer, [facility] Clinical Pharmacist Phone [phone]  03/18/2018 7:44 AM

## 2018-03-19 ENCOUNTER — Inpatient Hospital Stay (HOSPITAL_COMMUNITY): Payer: Medicaid Other

## 2018-03-19 DIAGNOSIS — E876 Hypokalemia: Secondary | ICD-10-CM

## 2018-03-19 DIAGNOSIS — J9602 Acute respiratory failure with hypercapnia: Secondary | ICD-10-CM

## 2018-03-19 LAB — COMPREHENSIVE METABOLIC PANEL
ALT: 38 U/L (ref 0–44)
AST: 23 U/L (ref 15–41)
Albumin: 2.6 g/dL — ABNORMAL LOW (ref 3.5–5.0)
Alkaline Phosphatase: 36 U/L — ABNORMAL LOW (ref 38–126)
Anion gap: 12 (ref 5–15)
BUN: 30 mg/dL — ABNORMAL HIGH (ref 6–20)
CO2: 34 mmol/L — ABNORMAL HIGH (ref 22–32)
CREATININE: 0.7 mg/dL (ref 0.61–1.24)
Calcium: 8.8 mg/dL — ABNORMAL LOW (ref 8.9–10.3)
Chloride: 97 mmol/L — ABNORMAL LOW (ref 98–111)
GFR calc Af Amer: >60 mL/min (ref >60)
GFR calc non Af Amer: >60 mL/min (ref >60)
Glucose, Bld: 173 mg/dL — ABNORMAL HIGH (ref 70–99)
Potassium: 3.6 mmol/L (ref 3.5–5.1)
SODIUM: 143 mmol/L (ref 135–145)
TOTAL PROTEIN: 5.9 g/dL — AB (ref 6.5–8.1)
Total Bilirubin: 0.4 mg/dL (ref 0.3–1.2)

## 2018-03-19 LAB — BLOOD GAS, ARTERIAL
Acid-Base Excess: 11.5 mmol/L — ABNORMAL HIGH (ref 0.0–2.0)
Bicarbonate: 36.1 mmol/L — ABNORMAL HIGH (ref 20.0–28.0)
Drawn by: 274071
FIO2: 70
MECHVT: 530 mL
O2 Saturation: 94.8 %
PEEP: 10 cmH2O
PO2 ART: 71 mmHg — AB (ref 83.0–108.0)
Patient temperature: 98.6
RATE: 2 resp/min
pCO2 arterial: 52.1 mmHg — ABNORMAL HIGH (ref 32.0–48.0)
pH, Arterial: 7.455 — ABNORMAL HIGH (ref 7.350–7.450)

## 2018-03-19 LAB — GLUCOSE, CAPILLARY
GLUCOSE-CAPILLARY: 118 mg/dL — AB (ref 70–99)
GLUCOSE-CAPILLARY: 102 mg/dL — AB (ref 70–99)
GLUCOSE-CAPILLARY: 115 mg/dL — AB (ref 70–99)
Glucose-Capillary: 133 mg/dL — ABNORMAL HIGH (ref 70–99)
Glucose-Capillary: 144 mg/dL — ABNORMAL HIGH (ref 70–99)
Glucose-Capillary: 129 mg/dL — ABNORMAL HIGH (ref 70–99)
Glucose-Capillary: 122 mg/dL — ABNORMAL HIGH (ref 70–99)

## 2018-03-19 LAB — CBC
HCT: 43.7 % (ref 39.0–52.0)
Hemoglobin: 13.4 g/dL (ref 13.0–17.0)
MCH: 31.2 pg (ref 26.0–34.0)
MCHC: 30.7 g/dL (ref 30.0–36.0)
MCV: 101.9 fL — ABNORMAL HIGH (ref 80.0–100.0)
Platelets: 202 10*3/uL (ref 150–400)
RBC: 4.29 MIL/uL (ref 4.22–5.81)
RDW: 13 % (ref 11.5–15.5)
WBC: 10.6 10*3/uL — AB (ref 4.0–10.5)
nRBC: 0 % (ref 0.0–0.2)

## 2018-03-19 LAB — PROTIME-INR
INR: 1.11
Prothrombin Time: 14.2 seconds (ref 11.4–15.2)

## 2018-03-19 LAB — BRAIN NATRIURETIC PEPTIDE: B Natriuretic Peptide: 52.1 pg/mL (ref 0.0–100.0)

## 2018-03-19 MED ORDER — FAMOTIDINE 40 MG/5ML PO SUSR
40.0000 mg | Freq: Every day | ORAL | Status: DC
  Administered 2018-03-19 – 2018-03-20 (×2): 40 mg
  Filled 2018-03-19 (×2): qty 5

## 2018-03-19 MED ORDER — DEXTROSE IN LACTATED RINGERS 5 % IV SOLN
INTRAVENOUS | Status: DC
  Administered 2018-03-19 – 2018-03-29 (×8): via INTRAVENOUS
  Filled 2018-03-19 (×3): qty 1000

## 2018-03-19 NOTE — Progress Notes (Signed)
  Echocardiogram 2D Echocardiogram has been performed.  Dodd Schmid T Rian Busche 03/19/2018, 3:24 PM

## 2018-03-19 NOTE — Progress Notes (Signed)
Transported to CT and back to 2 Heart. No noted respiratory issues.

## 2018-03-19 NOTE — Progress Notes (Addendum)
NAME:  Brian [name], MRN:  [id number], DOB:  [birth year], LOS: 74 ADMISSION DATE:  02/15/2018, CONSULTATION DATE:  02/08/2018 REFERRING MD:  Mable Paris - ED, CHIEF COMPLAINT:  Acute respiratory failure.   HPI/course in hospital  62 year old man who was intubated and transferred from [facility] for hypercapneic respiratory failure.   Past Medical History  He,  has a past medical history of Alcohol abuse, Arthritis, Cardiomyopathy ([place]), CHF (congestive heart failure) ([place]) with EF 25%. ,Cocaine abuse ([place]), COPD (chronic obstructive pulmonary disease) ([place]), Heart failure, Homelessness, Noncompliance with multiple trips to hospital before leaving AMA  , Paroxysmal VT ([place]), Suicide attempt ([place]), and Tobacco abuse.   Significant tests/ events reviewed 12/5 Precedex was restarted and titrated as high as 1.6. 12/7 on HFNC 15L, interactive on precedex 12/8 re-intubated for hypoxia (? atx vs PNA) 12/9 high peep/fio2. Slowly weaning 12/10 getting CT chest and bubble study as FIO2 and PEEP requirements seem out of proportion to CXR findings.   Diagnostic tests:  CT chest 12/10>>> ECHO w/ bubble study 12/10>>>  Culture data  12/2 sputum: H influenza (Beta lactamase neg); Proteus mirablis  bcx2 12/8>>> 12/8 sputum>>>  abx  Rocephin 12/2>>> Interim history/subjective:  No sig change   Objective   Blood pressure (Abnormal) 80/58, pulse 90, temperature 98.7 F (37.1 C), temperature source Oral, resp. rate 20, height '5\' 7"'  (1.702 m), weight 84.7 kg, SpO2 92 %. CVP:  [0 mmHg-14 mmHg] 4 mmHg  Vent Mode: PRVC FiO2 (%):  [70 %-80 %] 70 % Set Rate:  [20 bmp] 20 bmp Vt Set:  [530 mL] 530 mL PEEP:  [10 cmH20] 10 cmH20 Plateau Pressure:  [18 cmH20-20 cmH20] 18 cmH20   Intake/Output Summary (Last 24 hours) at 03/19/2018 0936 Last data filed at 03/19/2018 0800 Gross per 24 hour  Intake 1929.21 ml  Output 1335 ml  Net 594.21 ml   Filed Weights   03/17/18 0400 03/18/18 0305 03/19/18 0500    Weight: 80.7 kg 81.2 kg 84.7 kg   General 62 year old chronically ill appearing male HENT NCAT on JVD orally intubated and w/out JVD pulm clear, decreased bases. No accessory use Card RRR w/out MRG abd not tender + bowel sounds  Neuro awake follows commands no focal def GU cl yellow Ext brisk CR  Assessment & Plan:  Acute hypercarbic respiratory failure requiring mechanical ventilation.  Cause of respiratory failure appears to be AECOPD +/- PNA w/ viral panel + for rhinovirus & proteus and H influenza  - extubated 03/12/18, but deterioration again 12/8  [facility] personally reviewed: no sig change in bibasilar airspace disease.  -His FIO2 and peep requirements seem out of proportion to his CXR changes. He has been on therapeutic LMWH so doubt that this is PE burden especially as CVP only 5.  Plan Cont full vent support Wean for sats > 88% Decreased RR to 18.  PAD protocol VAP bundle CT chest to better evaluate effusions/atx Cont pulm hygiene  ECHO w/ bubble study Cont BDs and HT saline nebs May need trach Day 9 of 10 rocephin   Acute on chronic systolic heart failure-EF 35% -CVP and contraction alk suggest mild volume depletion  Plan Gentle hydration  Cont tele Cont asa and lipitor   Acute Encephalopathy, agitated delirium requiring titration of sedating medications.  Currently optimal  Plan Cont clonazepam, Risperdal and fent gtt. RASS goal -1  R femoral DVT with suspected pulmonary embolism. Plan Cont therapeutic LMWH    Dysphagia.   Plan  Cont tube feeds  Urinary retention Plan Keep foley in place for now    Best practice    Nutrition: tubefeeds DVT: therapeutic LMWH Glucose:NI  Sedation: PAD protocol RASS goal -1 PUD: pepcid   Erick Colace ACNP-BC [place] Pager # [phone] OR # [phone] if no answer  Attending Note:  62 year old male with extensive [place] who presents to PCCM intubated with both hypoxemic and hypercarbic  respiratory failure.  On exam, lungs are clear.  I reviewed CXR myself, hypoxemia is well out of proportions with CXR findings on the CXR that I reviewed myself.  Will order a CT of the chest to evaluate lung parenchyma.  Cardiac echo with a bubble study ordered.  Maintain on full vent support.  Titrate O2 for sat of 88-92% as able.  Hold off weaning for now.  Suspect will need an tracheostomy once PEEP and FiO2 are more stable.  PCCM will continue to follow.  The patient is critically ill with multiple organ systems failure and requires high complexity decision making for assessment and support, frequent evaluation and titration of therapies, application of advanced monitoring technologies and extensive interpretation of multiple databases.   Critical Care Time devoted to patient care services described in this note is  34  Minutes. This time reflects time of care of this signee Dr Jennet Maduro. This critical care time does not reflect procedure time, or teaching time or supervisory time of PA/NP/Med student/Med Resident etc but could involve care discussion time.  Rush Farmer, M.D. [facility] Pulmonary/Critical Care Medicine. Pager: [phone]. After hours pager: [phone].

## 2018-03-19 NOTE — Progress Notes (Signed)
Speech Language Pathology Discharge Patient Details Name: Brian [name] MRN: [id number] DOB: 11/07/1955 Today's Date: 03/19/2018 Time:  -     Patient discharged from SLP services secondary to medical decline - will need to re-order SLP to resume therapy services. Pt remains orally intubated. Please reconsult when extubated.  Please see latest therapy progress note for current level of functioning and progress toward goals.    Progress and discharge plan discussed with patient and/or caregiver: Patient unable to participate in discharge planning and no caregivers available  GO     Royce Macadamia 03/19/2018, 2:01 PM    Breck Coons Lonell Face.Ed Nurse, children's [phone] Office [phone]

## 2018-03-20 ENCOUNTER — Inpatient Hospital Stay (HOSPITAL_COMMUNITY): Payer: Medicaid Other

## 2018-03-20 ENCOUNTER — Inpatient Hospital Stay (INDEPENDENT_AMBULATORY_CARE_PROVIDER_SITE_OTHER): Payer: Self-pay | Admitting: Physician Assistant

## 2018-03-20 DIAGNOSIS — I280 Arteriovenous fistula of pulmonary vessels: Secondary | ICD-10-CM

## 2018-03-20 DIAGNOSIS — J441 Chronic obstructive pulmonary disease with (acute) exacerbation: Secondary | ICD-10-CM

## 2018-03-20 DIAGNOSIS — I509 Heart failure, unspecified: Secondary | ICD-10-CM

## 2018-03-20 LAB — CULTURE, RESPIRATORY W GRAM STAIN

## 2018-03-20 LAB — POCT I-STAT 3, ART BLOOD GAS (G3+)
ACID-BASE EXCESS: 9 mmol/L — AB (ref 0.0–2.0)
ACID-BASE EXCESS: 9 mmol/L — AB (ref 0.0–2.0)
Bicarbonate: 35.2 mmol/L — ABNORMAL HIGH (ref 20.0–28.0)
Bicarbonate: 36.5 mmol/L — ABNORMAL HIGH (ref 20.0–28.0)
O2 Saturation: 67 %
O2 Saturation: 95 %
PCO2 ART: 56 mmHg — AB (ref 32.0–48.0)
Patient temperature: 100.8
TCO2: 37 mmol/L — ABNORMAL HIGH (ref 22–32)
TCO2: 38 mmol/L — ABNORMAL HIGH (ref 22–32)
pCO2 arterial: 64.4 mmHg — ABNORMAL HIGH (ref 32.0–48.0)
pH, Arterial: 7.367 (ref 7.350–7.450)
pH, Arterial: 7.412 (ref 7.350–7.450)
pO2, Arterial: 38 mmHg — CL (ref 83.0–108.0)
pO2, Arterial: 88 mmHg (ref 83.0–108.0)

## 2018-03-20 LAB — GLUCOSE, CAPILLARY
Glucose-Capillary: 103 mg/dL — ABNORMAL HIGH (ref 70–99)
Glucose-Capillary: 104 mg/dL — ABNORMAL HIGH (ref 70–99)
Glucose-Capillary: 108 mg/dL — ABNORMAL HIGH (ref 70–99)
Glucose-Capillary: 124 mg/dL — ABNORMAL HIGH (ref 70–99)
Glucose-Capillary: 143 mg/dL — ABNORMAL HIGH (ref 70–99)
Glucose-Capillary: 85 mg/dL (ref 70–99)
Glucose-Capillary: 86 mg/dL (ref 70–99)

## 2018-03-20 LAB — COMPREHENSIVE METABOLIC PANEL
ALT: 33 U/L (ref 0–44)
AST: 22 U/L (ref 15–41)
Albumin: 2.1 g/dL — ABNORMAL LOW (ref 3.5–5.0)
Alkaline Phosphatase: 33 U/L — ABNORMAL LOW (ref 38–126)
Anion gap: 8 (ref 5–15)
BUN: 22 mg/dL — ABNORMAL HIGH (ref 6–20)
CO2: 34 mmol/L — ABNORMAL HIGH (ref 22–32)
Calcium: 8.7 mg/dL — ABNORMAL LOW (ref 8.9–10.3)
Chloride: 101 mmol/L (ref 98–111)
Creatinine, Ser: 0.74 mg/dL (ref 0.61–1.24)
Glucose, Bld: 323 mg/dL — ABNORMAL HIGH (ref 70–99)
POTASSIUM: 3.9 mmol/L (ref 3.5–5.1)
Sodium: 143 mmol/L (ref 135–145)
Total Bilirubin: 0.2 mg/dL — ABNORMAL LOW (ref 0.3–1.2)
Total Protein: 5.6 g/dL — ABNORMAL LOW (ref 6.5–8.1)

## 2018-03-20 LAB — CULTURE, RESPIRATORY

## 2018-03-20 LAB — PROTIME-INR
INR: 1.1
Prothrombin Time: 14.1 seconds (ref 11.4–15.2)

## 2018-03-20 MED ORDER — MIDAZOLAM HCL 2 MG/2ML IJ SOLN
4.0000 mg | Freq: Once | INTRAMUSCULAR | Status: AC
  Administered 2018-03-20: 4 mg via INTRAVENOUS

## 2018-03-20 MED ORDER — MIDAZOLAM HCL 2 MG/2ML IJ SOLN
INTRAMUSCULAR | Status: AC
  Filled 2018-03-20: qty 2

## 2018-03-20 MED ORDER — CHLORHEXIDINE GLUCONATE 0.12% ORAL RINSE (MEDLINE KIT)
15.0000 mL | Freq: Two times a day (BID) | OROMUCOSAL | Status: DC
  Administered 2018-03-20 – 2018-04-07 (×34): 15 mL via OROMUCOSAL

## 2018-03-20 MED ORDER — ORAL CARE MOUTH RINSE
15.0000 mL | OROMUCOSAL | Status: DC
  Administered 2018-03-20 – 2018-04-06 (×143): 15 mL via OROMUCOSAL

## 2018-03-20 MED ORDER — MIDAZOLAM HCL 2 MG/2ML IJ SOLN
INTRAMUSCULAR | Status: AC
  Administered 2018-03-20: 4 mg via INTRAVENOUS
  Filled 2018-03-20: qty 2

## 2018-03-20 NOTE — Progress Notes (Signed)
PT Cancellation Note  Patient Details Name: Brian [name] MRN: [id number] DOB: 11-Jan-1956   Cancelled Treatment:    Reason Eval/Treat Not Completed: Medical issues which prohibited therapy. Pt with medical decline and remains orally intubated. PT will sign off; discussed with RN. Please re-consult when pt is medically ready.   Laurina Bustle, PT, DPT Acute Rehabilitation Services Pager [phone] Office [phone]    Vanetta Mulders 03/20/2018, 8:52 AM

## 2018-03-20 NOTE — Interval H&P Note (Signed)
History and Physical Interval Note:  03/20/2018 4:28 PM  Brian [name]  has presented today for surgery, with the diagnosis of * No surgery found *  The various methods of treatment have been discussed with the patient and family. After consideration of risks, benefits and other options for treatment, the patient has consented to  * No surgery found * as a surgical intervention .  The patient's history has been reviewed, patient examined, no change in status, stable for surgery.  I have reviewed the patient's chart and labs.  Questions were answered to the patient's satisfaction.     Dalton [employer]

## 2018-03-20 NOTE — CV Procedure (Signed)
Procedure: TEE  Indication: ?Right to left shunt at atrial level  Sedation: Fentanyl 100 mcg/hr, Versed 4 mg IV  Findings: Please see echo section for full report.  Normal LV size with mild LV hypertrophy.  EF 45%, mild diffuse hypokinesis.  Mildly dilated RV with mildly decreased systolic function.  Mild left atrial enlargement, no LA appendage thrombus.  There was no definite PFO or ASD noted by color doppler or bubble study.  Trivial TR, unable to estimate PA systolic pressure.  Trivial MR.  Trileaflet aortic valve with no stenosis or regurgitation.  Normal caliber ascending aorta.    Impression: No evidence for right to left shunt seen.   Marca Ancona 03/20/2018 4:49 PM

## 2018-03-20 NOTE — H&P (View-Only) (Signed)
NAME:  Brian [name], MRN:  [id number], DOB:  08-23-1955, LOS: 12 ADMISSION DATE:  02/19/2018, CONSULTATION DATE:  03/03/2018 REFERRING MD:  Mable Paris - ED, CHIEF COMPLAINT:  Acute respiratory failure.   HPI/course in hospital  62 year old man who was intubated and transferred from [facility] for hypercapneic respiratory failure.   Past Medical History  He,  has a past medical history of Alcohol abuse, Arthritis, Cardiomyopathy ([place]), CHF (congestive heart failure) ([place]) with EF 25%. ,Cocaine abuse ([place]), COPD (chronic obstructive pulmonary disease) ([place]), Heart failure, Homelessness, Noncompliance with multiple trips to hospital before leaving AMA  , Paroxysmal VT ([place]), Suicide attempt ([place]), and Tobacco abuse.   Significant tests/ events reviewed 12/5 Precedex was restarted and titrated as high as 1.6. 12/7 on HFNC 15L, interactive on precedex 12/8 re-intubated for hypoxia (? atx vs PNA) 12/9 high peep/fio2. Slowly weaning 12/10 getting CT chest and bubble study as FIO2 and PEEP requirements seem out of proportion to CXR findings.  12/11: + Right to left shunt (small via TTE). CT chest w/ bibasilar PNA; TEE ordered. Still not weaning much.   Diagnostic tests:  CT chest 12/10>>>R>L PNA/consolidation left superior seg pulm nodules.  ECHO w/ bubble study 12/10>>>small right to left shunt.  Ejection fraction 40 to 45%, mild diffuse hypokinesis  Culture data  12/2 sputum: H influenza (Beta lactamase neg); Proteus mirablis  bcx2 12/8>>> 12/8 sputum>>>few candida   abx  Rocephin 12/2>>>12/11 Interim history/subjective:  No sig change    Objective   Blood pressure (Abnormal) 89/57, pulse 77, temperature (Abnormal) 101 F (38.3 C), temperature source Axillary, resp. rate 18, height _0  (1.702 m), weight 84.9 kg, SpO2 93 %. CVP:  [2 mmHg-6 mmHg] 2 mmHg  Vent Mode: PRVC FiO2 (%):  [65 %-70 %] 65 % Set Rate:  [18 bmp] 18 bmp Vt Set:  [530 mL] 530 mL PEEP:  [8 cmH20] 8  cmH20 Plateau Pressure:  [15 cmH20-22 cmH20] 17 cmH20   Intake/Output Summary (Last 24 hours) at 03/20/2018 2202 Last data filed at 03/20/2018 0700 Gross per 24 hour  Intake 3152.75 ml  Output 945 ml  Net 2207.75 ml   Filed Weights   03/18/18 0305 03/19/18 0500 03/20/18 0458  Weight: 81.2 kg 84.7 kg 84.9 kg  General: 62 year old white male currently resting in bed he is in no acute distress HEENT normocephalic atraumatic no jugular venous distention orally intubated Pulmonary: Clear to auscultation no accessory use no wheezing Cardiac: Regular rate and rhythm without murmur rub or gallop Abdomen: Soft nontender  Ext:  warm dry brisk cap refill strong pulses Neuro: Awake, follows commands, no focal deficits.  Assessment & Plan:  Acute hypercarbic respiratory failure requiring mechanical ventilation.  Cause of respiratory failure appears to be AECOPD +/- PNA w/ viral panel + for rhinovirus & proteus and H influenza  - extubated 03/12/18, but deterioration again 12/8  CT chest was personally reviewed: This demonstrates bibasilar right greater than left pneumonia/consolidation very minimal effusions.  He does have a small left pulmonary nodules -Bubble study via transthoracic echocardiogram did show small shunt however calculated shunt at bedside seems out of proportion to degree of shunt Plan Continuing full ventilator support Weaning FiO2 for saturations greater than 88% Continuing scheduled bronchodilators Discontinued ceftriaxone status post 10 days For transfer esophageal echocardiogram today to further evaluate presence of possible right to left shunt I am concerned he may need tracheostomy  Acute on chronic systolic heart failure-EF 35% -CVP and contraction alk suggest mild volume  depletion  Plan Cont gentle hydration  Cont tele  Cont asa   Acute Encephalopathy, agitated delirium requiring titration of sedating medications.  Currently optimal  Plan Cont Clonazepam,  Risperdal and fent gtt for RASS -1    R femoral DVT with suspected pulmonary embolism. Plan Cont therapeutic LMWH   Dysphagia.   Plan Cont tubefeeds  Urinary retention Plan Keep foley in place   Hyperglycemia Plan ssi    Best practice    Nutrition: tubefeeds DVT: therapeutic LMWH Glucose:NI  Sedation: PAD protocol RASS goal -1 PUD: pepcid   Erick Colace ACNP-BC [place] Pager # [phone] OR # [phone] if no answer  Attending Note:  62 year old male with COPD and CHF presenting with respiratory failure after cocaine abuse.  Patient's O2 demand has been very high and continued to increase overnight.  On exam, clear lungs.  I reviewed chest CT myself, lungs are relatively clear for the level of O2 demand.  Discussed with cardiology, the patient's PaO2 on 21% is 38 and increases to 88 with 100% FiO2 indicating a large R to L shunt.  The echo indicated a small shunt which does not fit physiologically.  Spoke with Dr. Marigene Ehlers, will perform a TEE today and evaluate the shunt and evaluate if the shunt needs to be closed.  Will f/u in the afternoon.  The patient is critically ill with multiple organ systems failure and requires high complexity decision making for assessment and support, frequent evaluation and titration of therapies, application of advanced monitoring technologies and extensive interpretation of multiple databases.   Critical Care Time devoted to patient care services described in this note is  40  Minutes. This time reflects time of care of this signee Dr Jennet Maduro. This critical care time does not reflect procedure time, or teaching time or supervisory time of PA/NP/Med student/Med Resident etc but could involve care discussion time.  Rush Farmer, M.D. [facility] Pulmonary/Critical Care Medicine. Pager: [phone]. After hours pager: [phone].

## 2018-03-20 NOTE — Progress Notes (Addendum)
NAME:  Brian [name], MRN:  [id number], DOB:  1955/09/13, LOS: 12 ADMISSION DATE:  02/24/2018, CONSULTATION DATE:  02/27/2018 REFERRING MD:  Mable Paris - ED, CHIEF COMPLAINT:  Acute respiratory failure.   HPI/course in hospital  62 year old man who was intubated and transferred from [facility] for hypercapneic respiratory failure.   Past Medical History  He,  has a past medical history of Alcohol abuse, Arthritis, Cardiomyopathy ([place]), CHF (congestive heart failure) ([place]) with EF 25%. ,Cocaine abuse ([place]), COPD (chronic obstructive pulmonary disease) ([place]), Heart failure, Homelessness, Noncompliance with multiple trips to hospital before leaving AMA  , Paroxysmal VT ([place]), Suicide attempt ([name]), and Tobacco abuse.   Significant tests/ events reviewed 12/5 Precedex was restarted and titrated as high as 1.6. 12/7 on HFNC 15L, interactive on precedex 12/8 re-intubated for hypoxia (? atx vs PNA) 12/9 high peep/fio2. Slowly weaning 12/10 getting CT chest and bubble study as FIO2 and PEEP requirements seem out of proportion to CXR findings.  12/11: + Right to left shunt (small via TTE). CT chest w/ bibasilar PNA; TEE ordered. Still not weaning much.   Diagnostic tests:  CT chest 12/10>>>R>L PNA/consolidation left superior seg pulm nodules.  ECHO w/ bubble study 12/10>>>small right to left shunt.  Ejection fraction 40 to 45%, mild diffuse hypokinesis  Culture data  12/2 sputum: H influenza (Beta lactamase neg); Proteus mirablis  bcx2 12/8>>> 12/8 sputum>>>few candida   abx  Rocephin 12/2>>>12/11 Interim history/subjective:  No sig change    Objective   Blood pressure (Abnormal) 89/57, pulse 77, temperature (Abnormal) 101 F (38.3 C), temperature source Axillary, resp. rate 18, height _0  (1.702 m), weight 84.9 kg, SpO2 93 %. CVP:  [2 mmHg-6 mmHg] 2 mmHg  Vent Mode: PRVC FiO2 (%):  [65 %-70 %] 65 % Set Rate:  [18 bmp] 18 bmp Vt Set:  [530 mL] 530 mL PEEP:  [8 cmH20] 8  cmH20 Plateau Pressure:  [15 cmH20-22 cmH20] 17 cmH20   Intake/Output Summary (Last 24 hours) at 03/20/2018 6568 Last data filed at 03/20/2018 0700 Gross per 24 hour  Intake 3152.75 ml  Output 945 ml  Net 2207.75 ml   Filed Weights   03/18/18 0305 03/19/18 0500 03/20/18 0458  Weight: 81.2 kg 84.7 kg 84.9 kg  General: 62 year old white male currently resting in bed he is in no acute distress HEENT normocephalic atraumatic no jugular venous distention orally intubated Pulmonary: Clear to auscultation no accessory use no wheezing Cardiac: Regular rate and rhythm without murmur rub or gallop Abdomen: Soft nontender  Ext:  warm dry brisk cap refill strong pulses Neuro: Awake, follows commands, no focal deficits.  Assessment & Plan:  Acute hypercarbic respiratory failure requiring mechanical ventilation.  Cause of respiratory failure appears to be AECOPD +/- PNA w/ viral panel + for rhinovirus & proteus and H influenza  - extubated 03/12/18, but deterioration again 12/8  CT chest was personally reviewed: This demonstrates bibasilar right greater than left pneumonia/consolidation very minimal effusions.  He does have a small left pulmonary nodules -Bubble study via transthoracic echocardiogram did show small shunt however calculated shunt at bedside seems out of proportion to degree of shunt Plan Continuing full ventilator support Weaning FiO2 for saturations greater than 88% Continuing scheduled bronchodilators Discontinued ceftriaxone status post 10 days For transfer esophageal echocardiogram today to further evaluate presence of possible right to left shunt I am concerned he may need tracheostomy  Acute on chronic systolic heart failure-EF 35% -CVP and contraction alk suggest mild volume  depletion  Plan Cont gentle hydration  Cont tele  Cont asa   Acute Encephalopathy, agitated delirium requiring titration of sedating medications.  Currently optimal  Plan Cont Clonazepam,  Risperdal and fent gtt for RASS -1    R femoral DVT with suspected pulmonary embolism. Plan Cont therapeutic LMWH   Dysphagia.   Plan Cont tubefeeds  Urinary retention Plan Keep foley in place   Hyperglycemia Plan ssi    Best practice    Nutrition: tubefeeds DVT: therapeutic LMWH Glucose:NI  Sedation: PAD protocol RASS goal -1 PUD: pepcid   Erick Colace ACNP-BC [place] Pager # [phone] OR # [phone] if no answer  Attending Note:  62 year old male with COPD and CHF presenting with respiratory failure after cocaine abuse.  Patient's O2 demand has been very high and continued to increase overnight.  On exam, clear lungs.  I reviewed chest CT myself, lungs are relatively clear for the level of O2 demand.  Discussed with cardiology, the patient's PaO2 on 21% is 38 and increases to 88 with 100% FiO2 indicating a large R to L shunt.  The echo indicated a small shunt which does not fit physiologically.  Spoke with Dr. Marigene Ehlers, will perform a TEE today and evaluate the shunt and evaluate if the shunt needs to be closed.  Will f/u in the afternoon.  The patient is critically ill with multiple organ systems failure and requires high complexity decision making for assessment and support, frequent evaluation and titration of therapies, application of advanced monitoring technologies and extensive interpretation of multiple databases.   Critical Care Time devoted to patient care services described in this note is  40  Minutes. This time reflects time of care of this signee Dr Jennet Maduro. This critical care time does not reflect procedure time, or teaching time or supervisory time of PA/NP/Med student/Med Resident etc but could involve care discussion time.  Rush Farmer, M.D. [facility] Pulmonary/Critical Care Medicine. Pager: [phone]. After hours pager: [phone].

## 2018-03-21 ENCOUNTER — Inpatient Hospital Stay (HOSPITAL_COMMUNITY): Payer: Medicaid Other

## 2018-03-21 DIAGNOSIS — I10 Essential (primary) hypertension: Secondary | ICD-10-CM

## 2018-03-21 DIAGNOSIS — Z930 Tracheostomy status: Secondary | ICD-10-CM

## 2018-03-21 DIAGNOSIS — L8990 Pressure ulcer of unspecified site, unspecified stage: Secondary | ICD-10-CM

## 2018-03-21 LAB — GLUCOSE, CAPILLARY
Glucose-Capillary: 102 mg/dL — ABNORMAL HIGH (ref 70–99)
Glucose-Capillary: 115 mg/dL — ABNORMAL HIGH (ref 70–99)
Glucose-Capillary: 286 mg/dL — ABNORMAL HIGH (ref 70–99)
Glucose-Capillary: 79 mg/dL (ref 70–99)
Glucose-Capillary: 82 mg/dL (ref 70–99)
Glucose-Capillary: 87 mg/dL (ref 70–99)
Glucose-Capillary: 96 mg/dL (ref 70–99)

## 2018-03-21 LAB — PROTIME-INR
INR: 1.1
Prothrombin Time: 14.1 seconds (ref 11.4–15.2)

## 2018-03-21 MED ORDER — PROPOFOL 10 MG/ML IV BOLUS: 500.0000 mg | Freq: Once | INTRAVENOUS | Status: DC

## 2018-03-21 MED ORDER — PROPOFOL 10 MG/ML IV BOLUS: 40.0000 mg | Freq: Once | INTRAVENOUS | Status: DC

## 2018-03-21 MED ORDER — FAMOTIDINE IN NACL 20-0.9 MG/50ML-% IV SOLN
20.0000 mg | Freq: Two times a day (BID) | INTRAVENOUS | Status: DC
  Administered 2018-03-21 – 2018-03-25 (×9): 20 mg via INTRAVENOUS
  Filled 2018-03-21 (×9): qty 50

## 2018-03-21 MED ORDER — SODIUM BICARBONATE 650 MG PO TABS
650.0000 mg | ORAL_TABLET | Freq: Once | ORAL | Status: DC
  Filled 2018-03-21: qty 1

## 2018-03-21 MED ORDER — VECURONIUM BROMIDE 10 MG IV SOLR
10.0000 mg | Freq: Once | INTRAVENOUS | Status: AC
  Administered 2018-03-21: 10 mg via INTRAVENOUS
  Filled 2018-03-21: qty 10

## 2018-03-21 MED ORDER — ENOXAPARIN SODIUM 80 MG/0.8ML ~~LOC~~ SOLN
80.0000 mg | Freq: Two times a day (BID) | SUBCUTANEOUS | Status: DC
  Administered 2018-03-22 – 2018-04-06 (×31): 80 mg via SUBCUTANEOUS
  Filled 2018-03-21 (×31): qty 0.8

## 2018-03-21 MED ORDER — PANCRELIPASE (LIP-PROT-AMYL) 10440-39150 UNITS PO TABS
20880.0000 [IU] | ORAL_TABLET | Freq: Once | ORAL | Status: AC
  Administered 2018-03-21: 10440 [IU]
  Filled 2018-03-21 (×2): qty 2

## 2018-03-21 MED ORDER — FENTANYL CITRATE (PF) 100 MCG/2ML IJ SOLN
200.0000 ug | Freq: Once | INTRAMUSCULAR | Status: DC
  Filled 2018-03-21: qty 4

## 2018-03-21 MED ORDER — ETOMIDATE 2 MG/ML IV SOLN
40.0000 mg | Freq: Once | INTRAVENOUS | Status: AC
  Administered 2018-03-21: 20 mg via INTRAVENOUS
  Filled 2018-03-21: qty 20

## 2018-03-21 MED ORDER — MIDAZOLAM HCL 2 MG/2ML IJ SOLN
5.0000 mg | Freq: Once | INTRAMUSCULAR | Status: AC
  Administered 2018-03-21: 2 mg via INTRAVENOUS
  Filled 2018-03-21: qty 6

## 2018-03-21 NOTE — Progress Notes (Addendum)
Spoke w/ CCM earlier re: pt on 100% fio2= aware.  No new RT orders currently.  CPT held this round d/t fresh trach.

## 2018-03-21 NOTE — Care Management Note (Addendum)
Case Management Note  Patient Details  Name: Brian [name] MRN: [id number] Date of Birth: 1955/09/04  Subjective/Objective:  62yo male presented from OSH for hypercapnic respiratory failure.                 Action/Plan: CM consult acknowledged and following for dispositional needs. PT/OT recommending SNF with CSW following for homelessness and placement needs.CM team will continue to follow.  Expected Discharge Date:                 Expected Discharge Plan:  Skilled Nursing Facility  In-House Referral:  Clinical Social Work  Discharge planning Services  CM Consult  Post Acute Care Choice:  NA Choice offered to:  NA  DME Arranged:  N/A DME Agency:  NA  HH Arranged:  NA HH Agency:  NA  Status of Service:  In process, will continue to follow  If discussed at Long Length of Stay Meetings, dates discussed:    Additional Comments: 03/25/18 @ 1351-Babetta Paterson RNCM-CM consult acknowledge for LTACH. Patient has  Medicaid with no LTACH benefits. CSW is following for trach/vent SNF placement. CM will continue to follow.   Colleen Can RN, BSN, NCM-BC, ACM-RN [phone] 03/21/2018, 2:28 PM

## 2018-03-21 NOTE — Progress Notes (Signed)
RN called d/t pt desat to 84%.  I was in a procedure, Marinell Blight RRT increased fio2 to 100%.

## 2018-03-21 NOTE — Progress Notes (Addendum)
NAME:  Brian [name], MRN:  [id number], DOB:  [birth year], LOS: 13 ADMISSION DATE:  02/08/2018, CONSULTATION DATE:  02/09/2018 REFERRING MD:  Lamont Snowball - ED, CHIEF COMPLAINT:  Acute respiratory failure.   HPI/course in hospital  62 year old man who was intubated and transferred from [facility] for hypercapneic respiratory failure.   Past Medical History  He,  has a past medical history of Alcohol abuse, Arthritis, Cardiomyopathy ([facility]), CHF (congestive heart failure) ([facility]) with EF 25%. ,Cocaine abuse ([facility]), COPD (chronic obstructive pulmonary disease) ([facility]), Heart failure, Homelessness, Noncompliance with multiple trips to hospital before leaving AMA  , Paroxysmal VT ([facility]), Suicide attempt ([facility]), and Tobacco abuse.   Significant tests/ events reviewed 12/5 Precedex was restarted and titrated as high as 1.6. 12/7 on HFNC 15L, interactive on precedex 12/8 re-intubated for hypoxia (? atx vs PNA) 12/9 high peep/fio2. Slowly weaning 12/10 getting CT chest and bubble study as FIO2 and PEEP requirements seem out of proportion to CXR findings.  12/11: + Right to left shunt (small via TTE). CT chest w/ bibasilar PNA; TEE ordered. Still not weaning much. TEE was neg for sig shunt. ABX stopped.  12/12 preparing for trach  Diagnostic tests:  CT chest 12/10>>>R>L PNA/consolidation left superior seg pulm nodules.  ECHO w/ bubble study 12/10>>>small right to left shunt.  Ejection fraction 40 to 45%, mild diffuse hypokinesis  Culture data  12/2 sputum: H influenza (Beta lactamase neg); Proteus mirablis  bcx2 12/8>>> 12/8 sputum>>>few candida   abx  Rocephin 12/2>>>12/11 Interim history/subjective:  No sig change/ still on high flow    Objective   Blood pressure (Abnormal) 93/58, pulse 80, temperature (Abnormal) 100.4 F (38 C), temperature source Axillary, resp. rate 18, height 5\' 7"  (1.702 m), weight 86.3 kg, SpO2 90 %.    Vent Mode: PRVC FiO2 (%):  [50 %-100 %] 50 % Set Rate:  [18 bmp] 18  bmp Vt Set:  [530 mL] 530 mL PEEP:  [5 cmH20-8 cmH20] 5 cmH20 Plateau Pressure:  [10 cmH20-17 cmH20] 13 cmH20   Intake/Output Summary (Last 24 hours) at 03/21/2018 0817 Last data filed at 03/21/2018 0600 Gross per 24 hour  Intake 2639.66 ml  Output 940 ml  Net 1699.66 ml   Filed Weights   03/19/18 0500 03/20/18 0458 03/21/18 0409  Weight: 84.7 kg 84.9 kg 86.3 kg  General: 62 year old white male currently remaining on high FiO2 and PEEP support HEENT normocephalic atraumatic orally intubated no jugular venous distention mucous membranes are moist sclera nonicteric Pulmonary: Diminished bases no accessory use no wheezing. Cardiac: Regular rate and rhythm without murmur rub or gallop Abdomen: Soft nontender no organomegaly. Extremities: Warm, dry, brisk capillary refill, no edema. GU: Clear yellow Neuro: Awake, follows commands, seemingly appropriate.  No focal deficits noted.   Assessment & Plan:   Acute hypercarbic respiratory failure requiring mechanical ventilation.  Cause of respiratory failure appears to be AECOPD +/- PNA w/ viral panel + for rhinovirus & proteus and H influenza  -TEE neg for sig R->L shunt. This makes explanation for hypoxia a little difficult given it still seems out of proportion to CT changes. Underlying COPD Plan Janina Mayo today if can touch base w/ family Full vent support w/ plan to begin PSV once trach in place. May just need higher FIO2 Cont BDs Cont pulm hygiene OOB after trach   Pulmonary Nodules Plan  f/u 6 months   Acute on chronic systolic heart failure-EF 35%  Plan KVO IVFs Cont tele   Acute Encephalopathy, agitated  delirium requiring titration of sedating medications.  Currently optimal  Plan Cont Clonazepam, Risperdal, fent at current.  After trach dc fent    R femoral DVT with suspected pulmonary embolism. Plan Cont therapeutic LMWH, holding for trach, will resume in a.m. 12/13  Dysphagia.   Plan Cont tubefeeds  Urinary  retention Plan Keeping f/c in place   Hyperglycemia Plan ssi    Best practice    Nutrition: tubefeeds DVT: therapeutic LMWH Glucose:NI  Sedation: PAD protocol RASS goal -1 PUD: pepcid   Simonne Martinet ACNP-BC [facility] Pulmonary/Critical Care Pager # [phone] OR # [phone] if no answer  Attending Note:  62 year old male with polysubstance and etoh abuse history who presents to PCCM with hypoxemic respiratory failure.  All work up for V/Q mismatch are negative at this point.  No events overnight, remains on high O2 demand.  On exam, he is arousable and follows commands with clear lungs.  I reviewed CXR myself, ETT is in a good position.  Discussed with PCCM-NP.  Will proceed with tracheostomy today.  Continue weaning as able.  Diureses as BP allows.  Will likely be a long term wean.  PCCM will continue to manage.  The patient is critically ill with multiple organ systems failure and requires high complexity decision making for assessment and support, frequent evaluation and titration of therapies, application of advanced monitoring technologies and extensive interpretation of multiple databases.   Critical Care Time devoted to patient care services described in this note is  32  Minutes. This time reflects time of care of this signee Dr Koren Bound. This critical care time does not reflect procedure time, or teaching time or supervisory time of PA/NP/Med student/Med Resident etc but could involve care discussion time.  Alyson Reedy, M.D. [facility] Pulmonary/Critical Care Medicine. Pager: [phone]. After hours pager: [phone].

## 2018-03-21 NOTE — Procedures (Signed)
Bedside Tracheostomy Insertion Procedure Note   Patient Details:   Name: Brian [name] DOB: 10/14/1955 MRN: [id number]  Procedure: Tracheostomy  Pre Procedure Assessment: ET Tube Size: 7.5 ET Tube secured at lip (cm): 22 Bite block in place: No Breath Sounds: Clear  Post Procedure Assessment: BP 105/61   Pulse 76   Temp 97.7 F (36.5 C) (Oral)   Resp 17   Ht 5\' 7"  (1.702 m)   Wt 86.3 kg   SpO2 93%   BMI 29.80 kg/m  O2 sats: stable throughout Complications: No apparent complications Patient did tolerate procedure well Tracheostomy Brand:Shiley Tracheostomy Style:Cuffed Tracheostomy Size: 6 Tracheostomy Secured HOZ:[id number], velcro Tracheostomy Placement Confirmation:Trach cuff visualized and in place and Chest X ray ordered for placement    Jacqulynn Cadet 03/21/2018, 2:19 PM

## 2018-03-21 NOTE — Clinical Social Work Note (Signed)
Clinical Social Work Assessment  Patient Details  Name: Brian [name] MRN: [id number] Date of Birth: 1956-01-30  Date of referral:  03/21/18               Reason for consult:  Facility Placement                Permission sought to share information with:    Permission granted to share information::  Yes, Release of Information Signed  Name::     Forensic scientist::  [employer] facilities  Relationship::  IT consultant Information:     Housing/Transportation Living arrangements for the past 2 months:  Homeless Source of Information:  Other (Comment Required)(Neice) Patient Interpreter Needed:  None Criminal Activity/Legal Involvement Pertinent to Current Situation/Hospitalization:  No - Comment as needed Significant Relationships:  Other Family Members Lives with:  Self Do you feel safe going back to the place where you live?  No Need for family participation in patient care:  Yes (Comment)  Care giving concerns:  Pt has a trach placed today. NO family or friends present at bedside.   Social Worker assessment / plan:  CSW spoke with pt's niece via telephone. Pt's niece states pt is homeless however would come stay with her for short periods of time. Pt's niece states pt is a alcoholic and pt's niece would not allow pt to drink at her home. Pt's niece lives in [place] and has a small child in the home. Pt's niece agreeable to SNF for pt at d/c. In terms of plan after SNF Pt's niece states pt will come stay with her until he is independent again. CSW to follow up with facilities that take trach patients.  Employment status:  Unemployed Health and safety inspector:  Medicaid In [place] PT Recommendations:  Skilled Nursing Facility Information / Referral to community resources:  Skilled Nursing Facility  Patient/Family's Response to care:  Pt's neice verbalized understanding of CSW role and expressed appreciation for support. Pt's neice denies any concern regarding pt care at this  time.   Patient/Family's Understanding of and Emotional Response to Diagnosis, Current Treatment, and Prognosis:  Pt's neice understanding and realistic regarding pt's physical limitations. Pt's neice understands the need for pt to d/c to SNF -- pt's Niece agreeable. Pt,s neice denies any concern regarding pt's treatment plan at this time. CSW will continue to provide support and facilitate d/c needs.   Emotional Assessment Appearance:  Appears stated age Attitude/Demeanor/Rapport:  Unable to Assess Affect (typically observed):  Unable to Assess Orientation:  (Unable to assess, pt on trach) Alcohol / Substance use:  Not Applicable Psych involvement (Current and /or in the community):  No (Comment)  Discharge Needs  Concerns to be addressed:  Basic Needs, Care Coordination, Homelessness Readmission within the last 30 days:  Yes Current discharge risk:  Dependent with Mobility Barriers to Discharge:  Continued Medical Work up   [employer], LCSW 03/21/2018, 2:49 PM

## 2018-03-21 NOTE — Progress Notes (Signed)
Pt w/ desat episode to 84% sustained, fio2 increased to 60%.  RN aware. Sat 88-89%

## 2018-03-21 NOTE — Progress Notes (Signed)
ANTICOAGULATION CONSULT NOTE   Pharmacy Consult for Lovenox Indication: DVT  No Known Allergies  Patient Measurements: Height: 5\' 7"  (170.2 cm) Weight: 190 lb 4.1 oz (86.3 kg) IBW/kg (Calculated) : 66.1 Heparin Dosing Weight: 85kg  Vital Signs: Temp: 100.7 F (38.2 C) (12/12 0756) Temp Source: Oral (12/12 0756) BP: 105/61 (12/12 0900) Pulse Rate: 74 (12/12 0900)  Labs: Recent Labs    03/19/18 0416 03/20/18 0455 03/21/18 0407  HGB 13.4  --   --   HCT 43.7  --   --   PLT 202  --   --   LABPROT 14.2 14.1 14.1  INR 1.11 1.10 1.10  CREATININE 0.70 0.74  --     Estimated Creatinine Clearance: 103.1 mL/min (by C-G formula based on SCr of 0.74 mg/dL).  Assessment: 62yom transfered from [facility] intubated and sedated for respiratory distress and hypotension.  New + DVT started on heparin, then switched to full-dose Lovenox.  No overt bleeding or complications noted, CBC stable.  Of note patient's weight has increased since admission but this is likely because she is 9L positive. Will keep dose as is for now   Goal of Therapy:  Anti Xa level 0.6-1.2 Monitor platelets by anticoagulation protocol: Yes   Plan:  Hold Lovenox today for tracheostomy. Will resume Lovenox 80 mg twice daily tomorrow  CBC q 72 hrs F/u for plans for oral anticoagulation as able.  Vinnie Level, PharmD., BCPS Clinical Pharmacist Clinical phone for 03/21/18 until 3:30pm: [phone] If after 3:30pm, please refer to [facility] for unit-specific pharmacist

## 2018-03-21 NOTE — Procedures (Signed)
Percutaneous Tracheostomy Placement  Consent from family.  Patient sedated, paralyzed and position.  Placed on 100% FiO2 and RR matched.  Area cleaned and draped.  Lidocaine/epi injected.  Skin incision done followed by blunt dissection.  Trachea palpated then punctured, catheter passed and visualized bronchoscopically.  Wire placed and visualized.  Catheter removed.  Airway then entered and dilated.  Size 6 cuffed shiley trach placed and visualized bronchoscopically well above carina.  Good volume returns.  Patient tolerated the procedure well without complications.  Minimal blood loss.  CXR ordered and pending.  Hadyn Blanck G. Atzel Mccambridge, M.D. [facility]monary/Critical Care Medicine. Pager: [phone]. After hours pager: [phone].  

## 2018-03-21 NOTE — Procedures (Signed)
Bronchoscopy Procedure Note Brian [name] [id number] 14-Sep-1955  Procedure: Bronchoscopy Indications: tracheostomy placement   Procedure Details Consent: Risks of procedure as well as the alternatives and risks of each were explained to the (patient/caregiver).  Consent for procedure obtained. Time Out: Verified patient identification, verified procedure, site/side was marked, verified correct patient position, special equipment/implants available, medications/allergies/relevent history reviewed, required imaging and test results available.  Performed  In preparation for procedure, patient was given 100% FiO2 and bronchoscope lubricated. Sedation: Benzodiazepines and Etomidate  Procedure done by P Domnic Vantol ACNP-BC, under direct supervision of Dr Molli Knock. At first bronch was introduce through ET tube and structures of tracheal rings, carina identified for operator of tracheostomy who was Dr Molli Knock . Light of bronch passed through trachea and skin for indentification of tracheal rings for tracheostomy puncture. After this, under bronchoscopy guidance,  ET tube was pulled back sufficiently and very carefully. The ET tube was  pulled back enough to give room for tracheostomy operator and yet at same time to to ensure a secured airway. After this was accomplished, bronchoscope was withdrawn into the ET tube. After this,  Dr Molli Knock  then performed tracheostomy under video visual provided by flexible video bronchoscopy. Followng introduction of tracheostomy,  the bronchoscope was removed from ET tube and introduced through tracheostomy. Correct position of tracheostomy was ensured, with enough room between carina and distal tracheostomy and no evidence of bleeding. The bronchoscope was then withdrawn. Respiratory therapist was then instructed to remove the ET tube.  Dr Molli Knock  then proceeded to complete the tracheostomy with stay sutures   No complications   Simonne Martinet ACNP-BC [facility]  Pulmonary/Critical Care Pager # [phone] OR # [phone] if no answer  Shelby Mattocks 03/21/2018

## 2018-03-22 LAB — COMPREHENSIVE METABOLIC PANEL
ALBUMIN: 1.9 g/dL — AB (ref 3.5–5.0)
ALT: 38 U/L (ref 0–44)
AST: 29 U/L (ref 15–41)
Alkaline Phosphatase: 34 U/L — ABNORMAL LOW (ref 38–126)
Anion gap: 11 (ref 5–15)
BILIRUBIN TOTAL: 0.3 mg/dL (ref 0.3–1.2)
BUN: 18 mg/dL (ref 6–20)
CO2: 29 mmol/L (ref 22–32)
Calcium: 8.5 mg/dL — ABNORMAL LOW (ref 8.9–10.3)
Chloride: 101 mmol/L (ref 98–111)
Creatinine, Ser: 0.53 mg/dL — ABNORMAL LOW (ref 0.61–1.24)
GFR calc Af Amer: >60 mL/min (ref >60)
GFR calc non Af Amer: >60 mL/min (ref >60)
GLUCOSE: 376 mg/dL — AB (ref 70–99)
POTASSIUM: 4.2 mmol/L (ref 3.5–5.1)
Sodium: 141 mmol/L (ref 135–145)
Total Protein: 5.3 g/dL — ABNORMAL LOW (ref 6.5–8.1)

## 2018-03-22 LAB — GLUCOSE, CAPILLARY
GLUCOSE-CAPILLARY: 88 mg/dL (ref 70–99)
GLUCOSE-CAPILLARY: 111 mg/dL — AB (ref 70–99)
Glucose-Capillary: 99 mg/dL (ref 70–99)
Glucose-Capillary: 84 mg/dL (ref 70–99)
Glucose-Capillary: 107 mg/dL — ABNORMAL HIGH (ref 70–99)
Glucose-Capillary: 108 mg/dL — ABNORMAL HIGH (ref 70–99)

## 2018-03-22 LAB — CBC
HCT: 39.7 % (ref 39.0–52.0)
Hemoglobin: 11.7 g/dL — ABNORMAL LOW (ref 13.0–17.0)
MCH: 30.8 pg (ref 26.0–34.0)
MCHC: 29.5 g/dL — ABNORMAL LOW (ref 30.0–36.0)
MCV: 104.5 fL — ABNORMAL HIGH (ref 80.0–100.0)
Platelets: 194 10*3/uL (ref 150–400)
RBC: 3.8 MIL/uL — ABNORMAL LOW (ref 4.22–5.81)
RDW: 13.2 % (ref 11.5–15.5)
WBC: 9.1 10*3/uL (ref 4.0–10.5)
nRBC: 0 % (ref 0.0–0.2)

## 2018-03-22 LAB — CULTURE, BLOOD (ROUTINE X 2)
Culture: NO GROWTH
Culture: NO GROWTH
Special Requests: ADEQUATE
Special Requests: ADEQUATE

## 2018-03-22 LAB — PROTIME-INR
INR: 1.14
Prothrombin Time: 14.5 seconds (ref 11.4–15.2)

## 2018-03-22 MED ORDER — FENTANYL 75 MCG/HR TD PT72
75.0000 ug | MEDICATED_PATCH | TRANSDERMAL | Status: DC
  Administered 2018-03-22: 75 ug via TRANSDERMAL
  Filled 2018-03-22: qty 1

## 2018-03-22 MED ORDER — VITAL AF 1.2 CAL PO LIQD
1000.0000 mL | ORAL | Status: DC
  Administered 2018-03-22 – 2018-03-31 (×10): 1000 mL

## 2018-03-22 NOTE — Progress Notes (Addendum)
NAME:  Brian [name], MRN:  [id number], DOB:  1955-11-12, LOS: 14 ADMISSION DATE:  02/18/2018, CONSULTATION DATE:  03/03/2018 REFERRING MD:  Brian [name] - ED, CHIEF COMPLAINT:  Acute respiratory failure.   HPI/course in hospital  62 year old man who was intubated and transferred from [facility] for hypercapneic respiratory failure.   Past Medical History  He,  has a past medical history of Alcohol abuse, Arthritis, Cardiomyopathy (HCC), CHF (congestive heart failure) (HCC) with EF 25%. ,Cocaine abuse (HCC), COPD (chronic obstructive pulmonary disease) (HCC), Heart failure, Homelessness, Noncompliance with multiple trips to hospital before leaving AMA  , Paroxysmal VT (HCC), Suicide attempt (HCC), and Tobacco abuse.   Significant tests/ events reviewed 12/5 Precedex was restarted and titrated as high as 1.6. 12/7 on HFNC 15L, interactive on precedex 12/8 re-intubated for hypoxia (? atx vs PNA) 12/9 high peep/fio2. Slowly weaning 12/10 getting CT chest and bubble study as FIO2 and PEEP requirements seem out of proportion to CXR findings.  12/11: + Right to left shunt (small via TTE). CT chest w/ bibasilar PNA; TEE ordered. Still not weaning much. TEE was neg for sig shunt. ABX stopped.  12/12 trach placed. CXR stable. Still requiring high FIO2 and PEEP.  12/13 placing Cortrack.   Diagnostic tests:  CT chest 12/10>>>R>L PNA/consolidation left superior seg pulm nodules.  ECHO w/ bubble study 12/10>>>small right to left shunt.  Ejection fraction 40 to 45%, mild diffuse hypokinesis  Culture data  12/2 sputum: H influenza (Beta lactamase neg); Proteus mirablis  bcx2 12/8>>> 12/8 sputum>>>few candida   abx  Rocephin 12/2>>>12/11 Interim history/subjective:  Still on high FIO2 and peep.  Agitated   Objective   Blood pressure 126/71, pulse 83, temperature (Abnormal) 101.4 F (38.6 C), temperature source Axillary, resp. rate (Abnormal) 23, height 5\' 7"  (1.702 m), weight 83.8 kg, SpO2 91  %. CVP:  [3 mmHg] 3 mmHg  Vent Mode: PRVC FiO2 (%):  [60 %-100 %] 90 % Set Rate:  [18 bmp] 18 bmp Vt Set:  [530 mL] 530 mL PEEP:  [5 cmH20] 5 cmH20 Plateau Pressure:  [10 cmH20-14 cmH20] 13 cmH20   Intake/Output Summary (Last 24 hours) at 03/22/2018 0924 Last data filed at 03/22/2018 0800 Gross per 24 hour  Intake 2245.64 ml  Output 980 ml  Net 1265.64 ml   Filed Weights   03/21/18 0409 03/21/18 0839 03/22/18 0355  Weight: 86.3 kg 86.3 kg 83.8 kg   General 62 year old white male. Currently a little agitated.  HENT NCAT. #6 trach site unremarkable. Some bloody tracheal secretion  Pulm: scattered coarse rhonchi no accessory use, still on high FIO2 70% Card RRR no MRG abd not tender + bowel sounds no OB EXT no sig edema brisk CR strong pulses  Neuro awake, interactive. Agitated today  GU cl yellow    Assessment & Plan:   Acute hypercarbic/hypoxic respiratory failure requiring mechanical ventilation.  Cause of respiratory failure appears to be AECOPD +/- PNA w/ viral panel + for rhinovirus & proteus and H influenza  -on-going refractory hypoxia. Seemingly only explained by slow to resolve PNA and underlying COPD (bubble study neg) s/p trach 12/12 -pcxr personally reviewed s/p trach w/ bibasilar atx and small effusions but not changes  Plan Cont wean FIO2 for sats >88% Cont PEEP at 10 Cont aggressive pulm hygiene w/ BDs and HT saline neb OOB Routine trach care Will prob need LTAC   Pulmonary Nodules Plan  F/u imaging 6 mo.    Acute on chronic  systolic heart failure-EF 35%  Plan Keep even Cont tele   Acute Encephalopathy, agitated delirium requiring titration of sedating medications.  Had been optimal. Little worse today now that he has missed clonazepam and Risperdal dosing  Plan Resume Risperdal and clonazepam Add fent patch Hope to wean off fent gtt after back on above meds.  OOB   R femoral DVT with suspected pulmonary embolism. Plan Resuming therapeutic  LMWH  Dysphagia.   Plan Cont tubefeeds. Placing cortrak Will need PEG likely will ask IR to eval   Urinary retention Plan Keeping F/C in place   Hyperglycemia Plan ssi    Best practice    Nutrition: tubefeeds DVT: therapeutic LMWH Glucose:NI  Sedation: PAD protocol RASS goal -1 PUD: pepcid   Brian [name] ACNP-BC [facility] Pulmonary/Critical Care Pager # [phone] OR # [phone] if no answer  Attending Note:  62 year old male with PMH of COPD who presents with acute on chronic hypoxemic respiratory failure.  Patient is failing to improve with oxygenation overnight with frequent desaturation.  On exam, clear lung with distant BS.  I reviewed CXR myself, trach is in a good position.  Discussed with PCCM-NP.  Will place cortrak today.  Will likely need PEG placement.  Increase PEEP to improve oxygenation.  No evidence of shunt noted.  Maintain on full support for now, will likely need placement in the near future.   PCCM will continue to follow.  The patient is critically ill with multiple organ systems failure and requires high complexity decision making for assessment and support, frequent evaluation and titration of therapies, application of advanced monitoring technologies and extensive interpretation of multiple databases.   Critical Care Time devoted to patient care services described in this note is  32  Minutes. This time reflects time of care of this signee Dr Brian [name]. This critical care time does not reflect procedure time, or teaching time or supervisory time of PA/NP/Med student/Med Resident etc but could involve care discussion time.  Brian [name], M.D. [facility] Pulmonary/Critical Care Medicine. Pager: [phone]. After hours pager: [phone].

## 2018-03-22 NOTE — Plan of Care (Signed)
  Problem: Clinical Measurements: Goal: Ability to maintain clinical measurements within normal limits will improve Outcome: Progressing Goal: Will remain free from infection Outcome: Progressing Goal: Diagnostic test results will improve Outcome: Progressing Goal: Cardiovascular complication will be avoided Outcome: Progressing   Problem: Nutrition: Goal: Adequate nutrition will be maintained Outcome: Progressing   Problem: Elimination: Goal: Will not experience complications related to urinary retention Outcome: Progressing   Problem: Role Relationship: Goal: Method of communication will improve Outcome: Progressing

## 2018-03-22 NOTE — Procedures (Signed)
Cortrak  Person Inserting Tube:  Brian [name], RD Tube Type:  Cortrak - 43 inches Tube Location:  Left nare Initial Placement:  Postpyloric Secured by: Bridle Technique Used to Measure Tube Placement:  Documented cm marking at nare/ corner of mouth Cortrak Secured At:  86 cm   No x-ray is required. RN may begin using tube.   If the tube becomes dislodged please keep the tube and contact the Cortrak team at [email or web address] (password [email or web address]) for replacement.  If after hours and replacement cannot be delayed, place a NG tube and confirm placement with an abdominal x-ray.   Vanessa Kick RD, LDN Clinical Nutrition Pager # [phone]

## 2018-03-22 NOTE — Evaluation (Signed)
Physical Therapy Evaluation Patient Details Name: Brian [name] MRN: [id number] DOB: 1955-07-23 Today's Date: 03/22/2018   History of Present Illness  62 year old man who was intubated and transferred from [facility] for hypercapneic respiratory failure. Extubated 12/3. Developed encephalopathy 12/6 and reintubated due to respiratory failure due to PNA bil on 12/8.  Trach performed 12/12.  right femoral DVT as well.  Past medical history of Alcohol abuse, Arthritis, Cardiomyopathy (HCC), CHF (congestive heart failure) (HCC) with EF 25%.Paroxysmal VT (HCC), Suicide attempt (HCC), and Tobacco abuse.  Clinical Impression  Pt admitted with above diagnosis. Pt currently with functional limitations due to the deficits listed below (see PT Problem List). Pt was only able to perform UE and LE exercises and needed constant cues and assist for exercises as pt is weak and kept falling asleep.  Did not mobilize to EOB as FiO2 at 70%. Will continue to progress pt as pt is able.   Pt will benefit from skilled PT to increase their independence and safety with mobility to allow discharge to the venue listed below.      Follow Up Recommendations SNF;Supervision/Assistance - 24 hour    Equipment Recommendations  Other (comment)(TBA)    Recommendations for Other Services       Precautions / Restrictions Precautions Precautions: Fall;Other (comment) Precaution Comments: watch O2 Sats Restrictions Weight Bearing Restrictions: No      Mobility  Bed Mobility               General bed mobility comments: Pt on 70% FiO2 therefore bed level eval.   Transfers                    Ambulation/Gait                Stairs            Wheelchair Mobility    Modified Rankin (Stroke Patients Only)       Balance                                             Pertinent Vitals/Pain Pain Assessment: Faces Faces Pain Scale: Hurts little more Pain Location:  generalized Pain Descriptors / Indicators: Grimacing;Discomfort Pain Intervention(s): Limited activity within patient's tolerance;Monitored during session;Repositioned    Home Living Family/patient expects to be discharged to:: Unsure                 Additional Comments: Note says pt may go stay with niece but can't drink if he goes with her.  Pt was homeless PTA.     Prior Function Level of Independence: Independent         Comments: Pt unable to give PLOF/living situation but is known to PT who states pt is homeless.     Hand Dominance   Dominant Hand: Right    Extremity/Trunk Assessment   Upper Extremity Assessment RUE Deficits / Details: pain with right shoulder flexion LUE Deficits / Details:  generalized weakness    Lower Extremity Assessment Lower Extremity Assessment: Generalized weakness       Communication   Communication: Tracheostomy  Cognition Arousal/Alertness: Lethargic;Suspect due to medications(on Precedex) Behavior During Therapy: Flat affect Overall Cognitive Status: Impaired/Different from baseline Area of Impairment: Orientation;Attention;Safety/judgement;Awareness;Problem solving                 Orientation Level: Disoriented to;Place;Time;Situation Current Attention  Level: Sustained     Safety/Judgement: Decreased awareness of safety;Decreased awareness of deficits Awareness: Intellectual Problem Solving: Slow processing;Decreased initiation        General Comments      Exercises General Exercises - Upper Extremity Shoulder Flexion: AAROM;10 reps;Both;Supine Shoulder Horizontal ADduction: AROM;Both;10 reps;Supine Elbow Flexion: AROM;AAROM;Both;10 reps;Supine Elbow Extension: AROM;AAROM;Both;10 reps;Supine General Exercises - Lower Extremity Ankle Circles/Pumps: AROM;Both;10 reps;Supine Quad Sets: AROM;Both;10 reps;Supine Heel Slides: Both;10 reps;Supine;AAROM Hip ABduction/ADduction: AAROM;Both;5  reps;Supine Straight Leg Raises: AAROM;Both;10 reps;Supine   Assessment/Plan    PT Assessment Patient needs continued PT services  PT Problem List Decreased strength;Decreased range of motion;Decreased activity tolerance;Decreased balance;Decreased mobility;Decreased coordination;Decreased cognition;Pain;Decreased knowledge of use of DME;Decreased safety awareness;Decreased knowledge of precautions;Cardiopulmonary status limiting activity       PT Treatment Interventions DME instruction;Gait training;Functional mobility training;Therapeutic activities;Therapeutic exercise;Balance training;Cognitive remediation;Patient/family education    PT Goals (Current goals can be found in the Care Plan section)  Acute Rehab PT Goals Patient Stated Goal: unable to state PT Goal Formulation: With patient Time For Goal Achievement: 04/05/18 Potential to Achieve Goals: Fair    Frequency Min 2X/week   Barriers to discharge Other (comment) homeless    Co-evaluation               AM-PAC PT "6 Clicks" Mobility  Outcome Measure Help needed turning from your back to your side while in a flat bed without using bedrails?: Total Help needed moving from lying on your back to sitting on the side of a flat bed without using bedrails?: Total Help needed moving to and from a bed to a chair (including a wheelchair)?: Total Help needed standing up from a chair using your arms (e.g., wheelchair or bedside chair)?: Total Help needed to walk in hospital room?: Total Help needed climbing 3-5 steps with a railing? : Total 6 Click Score: 6    End of Session Equipment Utilized During Treatment: Gait belt;Oxygen Activity Tolerance: Patient limited by fatigue Patient left: in bed;with call bell/phone within reach;with bed alarm set Nurse Communication: Mobility status PT Visit Diagnosis: Unsteadiness on feet (R26.81);Muscle weakness (generalized) (M62.81);Repeated falls (R29.6)    Time: 1287-8676 PT Time  Calculation (min) (ACUTE ONLY): 11 min   Charges:   PT Evaluation $PT Eval Moderate Complexity: 1 Mod          Alper Guilmette,PT Acute Rehabilitation Services Pager:  [phone]  Office:  [phone]    Berline Lopes 03/22/2018, 1:03 PM

## 2018-03-22 NOTE — Progress Notes (Signed)
Nutrition Follow-up  DOCUMENTATION CODES:   Not applicable  INTERVENTION:   Tube Feeding:  Change to Vital AF 1.2 @ 70 ml/hr  Provides 2016 kcals, 126 g of protein and 1361 mL of free water Meets 100% estimated calorie and protein needs   NUTRITION DIAGNOSIS:   Inadequate oral intake related to inability to eat as evidenced by NPO status.  Being addressed via TF   GOAL:   Patient will meet greater than or equal to 90% of their needs  Met  MONITOR:   Vent status, Labs, I & O's, Weight trends, TF tolerance  REASON FOR ASSESSMENT:   Ventilator, Consult Enteral/tube feeding initiation and management  ASSESSMENT:   62 year old male who presented to the [facility] ED on 11/29 with acute respiratory failure. Pt was intubated in the ED. PMH significant for COPD, CHF, substance use, tobacco use, and multiple prior hospitalizations with pt leaving AMA.  11/29 Intubated 12/03 Extubated 12/04 NPO, Cortrak Placed 12/08 Re-Intubated 12/12 Trach Placed, Cortrak clogged 12/13 Cortrak Re-placed, post-pyloric  Patient is currently on ventilator support via trach MV: 9.3 L/min Temp (24hrs), Avg:100.8 F (38.2 C), Min:99.3 F (37.4 C), Max:101.6 F (38.7 C)  Pt has been receiving Jevity 1.5 @ 55 ml/hr, Pro-Stat 30 mL daily via Cortrak  Net +10 L since admission; admission wt 89.8 kg. Noted current wt 83.8 kg. Lowest wt 80.7 kg since admission. Plan to utilize this as EDW at present time.   Physical Exam repeated; noted some muscle wasting present on this exam. New stage II noted to sacrum  Labs: reviewed Meds: fentanyl   NUTRITION - FOCUSED PHYSICAL EXAM:    Most Recent Value  Orbital Region  No depletion  Upper Arm Region  No depletion  Thoracic and Lumbar Region  No depletion  Buccal Region  No depletion  Temple Region  Mild depletion  Clavicle Bone Region  No depletion  Clavicle and Acromion Bone Region  No depletion  Scapular Bone Region  No depletion  Dorsal Hand   Unable to assess  Patellar Region  Mild depletion  Anterior Thigh Region  Mild depletion  Posterior Calf Region  Mild depletion  Edema (RD Assessment)  Mild  Hair  Reviewed  Eyes  Unable to assess  Mouth  Unable to assess  Skin  Reviewed  Nails  Reviewed       Diet Order:   Diet Order            Diet NPO time specified  Diet effective midnight              EDUCATION NEEDS:   Not appropriate for education at this time  Skin:  Skin Assessment: Skin Integrity Issues: Skin Integrity Issues:: Stage II Stage II: sacrum  Last BM:  12/4  Height:   Ht Readings from Last 1 Encounters:  02/22/2018 '5\' 7"'  (1.702 m)    Weight:   Wt Readings from Last 1 Encounters:  03/22/18 83.8 kg    Ideal Body Weight:  67.3 kg  BMI:  Body mass index is 28.94 kg/m.  Estimated Nutritional Needs:   Kcal:  2035 kcals  Protein:  120-145 g  Fluid:  >/= 2 L   Kerman Passey MS, RD, LDN, CNSC [phone] Pager  [phone] Weekend/On-Call Pager

## 2018-03-23 ENCOUNTER — Inpatient Hospital Stay (HOSPITAL_COMMUNITY): Payer: Medicaid Other

## 2018-03-23 LAB — COMPREHENSIVE METABOLIC PANEL
ALBUMIN: 2 g/dL — AB (ref 3.5–5.0)
ALT: 49 U/L — AB (ref 0–44)
AST: 40 U/L (ref 15–41)
Alkaline Phosphatase: 50 U/L (ref 38–126)
Anion gap: 7 (ref 5–15)
BUN: 21 mg/dL — ABNORMAL HIGH (ref 6–20)
CO2: 31 mmol/L (ref 22–32)
Calcium: 8.3 mg/dL — ABNORMAL LOW (ref 8.9–10.3)
Chloride: 102 mmol/L (ref 98–111)
Creatinine, Ser: 0.55 mg/dL — ABNORMAL LOW (ref 0.61–1.24)
GFR calc Af Amer: >60 mL/min (ref >60)
GFR calc non Af Amer: >60 mL/min (ref >60)
GLUCOSE: 168 mg/dL — AB (ref 70–99)
Potassium: 3.9 mmol/L (ref 3.5–5.1)
Sodium: 140 mmol/L (ref 135–145)
Total Bilirubin: 0.3 mg/dL (ref 0.3–1.2)
Total Protein: 5.5 g/dL — ABNORMAL LOW (ref 6.5–8.1)

## 2018-03-23 LAB — POCT I-STAT 3, ART BLOOD GAS (G3+)
Acid-Base Excess: 4 mmol/L — ABNORMAL HIGH (ref 0.0–2.0)
Bicarbonate: 31 mmol/L — ABNORMAL HIGH (ref 20.0–28.0)
O2 SAT: 83 %
PCO2 ART: 55 mmHg — AB (ref 32.0–48.0)
PH ART: 7.361 (ref 7.350–7.450)
Patient temperature: 99.8
TCO2: 33 mmol/L — ABNORMAL HIGH (ref 22–32)
pO2, Arterial: 53 mmHg — ABNORMAL LOW (ref 83.0–108.0)

## 2018-03-23 LAB — CBC
HCT: 41.4 % (ref 39.0–52.0)
Hemoglobin: 11.9 g/dL — ABNORMAL LOW (ref 13.0–17.0)
MCH: 29.7 pg (ref 26.0–34.0)
MCHC: 28.7 g/dL — ABNORMAL LOW (ref 30.0–36.0)
MCV: 103.2 fL — ABNORMAL HIGH (ref 80.0–100.0)
Platelets: 219 10*3/uL (ref 150–400)
RBC: 4.01 MIL/uL — ABNORMAL LOW (ref 4.22–5.81)
RDW: 13.1 % (ref 11.5–15.5)
WBC: 8.2 10*3/uL (ref 4.0–10.5)
nRBC: 0 % (ref 0.0–0.2)

## 2018-03-23 LAB — GLUCOSE, CAPILLARY
GLUCOSE-CAPILLARY: 109 mg/dL — AB (ref 70–99)
Glucose-Capillary: 103 mg/dL — ABNORMAL HIGH (ref 70–99)
Glucose-Capillary: 112 mg/dL — ABNORMAL HIGH (ref 70–99)
Glucose-Capillary: 115 mg/dL — ABNORMAL HIGH (ref 70–99)
Glucose-Capillary: 97 mg/dL (ref 70–99)
Glucose-Capillary: 98 mg/dL (ref 70–99)

## 2018-03-23 LAB — PROTIME-INR
INR: 1.11
Prothrombin Time: 14.2 seconds (ref 11.4–15.2)

## 2018-03-23 LAB — BRAIN NATRIURETIC PEPTIDE: B Natriuretic Peptide: 165.3 pg/mL — ABNORMAL HIGH (ref 0.0–100.0)

## 2018-03-23 MED ORDER — HYDROMORPHONE HCL 1 MG/ML PO LIQD
4.0000 mg | Freq: Four times a day (QID) | ORAL | Status: DC
  Administered 2018-03-23 – 2018-03-24 (×5): 4 mg
  Filled 2018-03-23 (×5): qty 4

## 2018-03-23 MED ORDER — LORAZEPAM 2 MG/ML IJ SOLN
0.5000 mg | INTRAMUSCULAR | Status: DC | PRN
  Administered 2018-03-24 – 2018-04-02 (×16): 1 mg via INTRAVENOUS
  Filled 2018-03-23 (×18): qty 1

## 2018-03-23 MED ORDER — FUROSEMIDE 10 MG/ML IJ SOLN
40.0000 mg | Freq: Once | INTRAMUSCULAR | Status: AC
  Administered 2018-03-23: 40 mg via INTRAVENOUS
  Filled 2018-03-23: qty 4

## 2018-03-23 NOTE — Progress Notes (Signed)
Nurse heard ventilator alarming.Patient's feet were out of the bed and patient was sitting up. Patient removed ventilator tubing from trach. Nurse attempted distraction and increasing fentanyl drip. Lorazepam 1 mg given. O2 sats decreased to 80s. RT contacted and called to bedside. Patient became combative when RT attempted to suction patient and place ventilator circuit tubing on the trach. Interventions ineffective. Dr. Violet Baldy contacted. Orders received for bilateral soft wrist restraints. See orders. Will continue to monitor.

## 2018-03-23 NOTE — Progress Notes (Signed)
eLink Physician-Brief Progress Note Patient Name: DORMAN CLINKSCALES DOB: 06-25-1955 MRN: [id number]   Date of Service  03/23/2018  HPI/Events of Note  Patient with episodes of agitation, pulling himself off the vent with subsequent desaturation now requiring FiO2 100%, PEEP 10, still with elevated Aa gradient. Increased Fentanyl titration and additional Ativan given.  Peak pressure 24-25 with minimal thin secretions.  eICU Interventions  CXR with atelectasis and possible bilateral effusion as seen on previous CT. BNP normal 3 days prior. Will recheck and assess need for diuresis. Patient as right to left shunt which could be contributing to hypoxemia.     Intervention Category Major Interventions: Hypoxemia - evaluation and management;Delirium, psychosis, severe agitation - evaluation and management  Darl Pikes 03/23/2018, 3:46 AM

## 2018-03-23 NOTE — Progress Notes (Signed)
NAME:  Brian [name], MRN:  [id number], DOB:  1956/02/24, LOS: 14 ADMISSION DATE:  02/28/2018, CONSULTATION DATE:  03/04/2018 REFERRING MD:  Lamont Snowball - ED, CHIEF COMPLAINT:  Acute respiratory failure.  HPI/course in hospital  62 year old man who was intubated and transferred from [facility] for hypercapneic respiratory failure. Deemed to be secondary to acute exacerbation of COPD and pneumonia secondary to Proteus mirabilis and Haemophilus influenzae. Has completed appropriate course of antibiotic. Initially extubated but required reintubation for hypoxia.  No evidence of shunt on TEE. Predominantly bibasilar infiltrates Sedation has been problem initially with alcohol withdrawal and then agitated delirium. Tracheostomy and Cortrak were placed.  Past Medical History  He,  has a past medical history of Alcohol abuse, Arthritis, Cardiomyopathy (HCC), CHF (congestive heart failure) (HCC), Cocaine abuse (HCC), COPD (chronic obstructive pulmonary disease) (HCC), Heart failure, Homelessness, Noncompliance, Paroxysmal VT (HCC), Suicide attempt (HCC), and Tobacco abuse.  Past Surgical History:  Procedure Laterality Date  . INNER EAR SURGERY    . LEFT HEART CATH AND CORONARY ANGIOGRAPHY N/A 02/25/2018   Procedure: LEFT HEART CATH AND CORONARY ANGIOGRAPHY;  Surgeon: Kathleene Hazel, MD;  Location: [facility] INVASIVE CV LAB;  Service: Cardiovascular;  Laterality: N/A;      Interim history/subjective:  Continues to have periods of agitation and desaturation overnight.  Objective   Blood pressure 97/64, pulse 91, temperature 99 F (37.2 C), temperature source Oral, resp. rate 18, height 5\' 7"  (1.702 m), weight 83.8 kg, SpO2 95 %. CVP:  [6 mmHg-18 mmHg] 7 mmHg  Vent Mode: PRVC FiO2 (%):  [70 %-100 %] 70 % Set Rate:  [18 bmp] 18 bmp Vt Set:  [530 mL] 530 mL PEEP:  [10 cmH20-12 cmH20] 10 cmH20 Plateau Pressure:  [16 cmH20-21 cmH20] 19 cmH20   Intake/Output Summary (Last 24 hours) at 03/23/2018  1607 Last data filed at 03/23/2018 1400 Gross per 24 hour  Intake 2146.44 ml  Output 2160 ml  Net -13.56 ml   Filed Weights   03/21/18 0839 03/22/18 0355 03/23/18 0500  Weight: 86.3 kg 83.8 kg 83.8 kg    Examination: Physical Exam  Constitutional: He appears listless.  Ill kempt  HENT:  Head: Normocephalic and atraumatic.  Eyes: Pupils are equal, round, and reactive to light.  Neck: No JVD present.  Cardiovascular: Normal rate and regular rhythm.  Respiratory: No respiratory distress. He has no wheezes. He has no rales.  GI: Soft.  Neurological: He appears listless.  No focal deficit  Skin: Skin is warm, dry and intact.     Ancillary tests (personally reviewed)  CBC: Recent Labs  Lab 03/17/18 0342 03/19/18 0416 03/22/18 0358 03/23/18 0229  WBC 11.0* 10.6* 9.1 8.2  HGB 17.5* 13.4 11.7* 11.9*  HCT 58.2* 43.7 39.7 41.4  MCV 102.5* 101.9* 104.5* 103.2*  PLT 217 202 194 219    Basic Metabolic Panel: Recent Labs  Lab 03/17/18 0342 03/18/18 0312 03/19/18 0416 03/20/18 0455 03/22/18 0358 03/23/18 0229  NA 141 144 143 143 141 140  K 5.3* 3.8 3.6 3.9 4.2 3.9  CL 95* 96* 97* 101 101 102  CO2 32 36* 34* 34* 29 31  GLUCOSE 130* 117* 173* 323* 376* 168*  BUN 21* 33* 30* 22* 18 21*  CREATININE 0.51* 0.83 0.70 0.74 0.53* 0.55*  CALCIUM 9.9 9.5 8.8* 8.7* 8.5* 8.3*  MG 1.9 2.1  --   --   --   --   PHOS 4.1 2.8  --   --   --   --  GFR: Estimated Creatinine Clearance: 101.7 mL/min (A) (by C-G formula based on SCr of 0.55 mg/dL (L)). Recent Labs  Lab 03/17/18 0342 03/19/18 0416 03/22/18 0358 03/23/18 0229  WBC 11.0* 10.6* 9.1 8.2    Liver Function Tests: Recent Labs  Lab 03/19/18 0416 03/20/18 0455 03/22/18 0358 03/23/18 0229  AST 23 22 29  40  ALT 38 33 38 49*  ALKPHOS 36* 33* 34* 50  BILITOT 0.4 0.2* 0.3 0.3  PROT 5.9* 5.6* 5.3* 5.5*  ALBUMIN 2.6* 2.1* 1.9* 2.0*   No results for input(s): LIPASE, AMYLASE in the last 168 hours. No results for  input(s): AMMONIA in the last 168 hours.  ABG    Component Value Date/Time   PHART 7.361 03/23/2018 0347   PCO2ART 55.0 (H) 03/23/2018 0347   PO2ART 53.0 (L) 03/23/2018 0347   HCO3 31.0 (H) 03/23/2018 0347   TCO2 33 (H) 03/23/2018 0347   ACIDBASEDEF 1.0 12/15/2017 1614   O2SAT 83.0 03/23/2018 0347     Coagulation Profile: Recent Labs  Lab 03/19/18 0416 03/20/18 0455 03/21/18 0407 03/22/18 0358 03/23/18 0229  INR 1.11 1.10 1.10 1.14 1.11    Cardiac Enzymes: No results for input(s): CKTOTAL, CKMB, CKMBINDEX, TROPONINI in the last 168 hours.   CBG: Recent Labs  Lab 03/22/18 2329 03/23/18 0339 03/23/18 0742 03/23/18 1135 03/23/18 1548  GLUCAP 111* 103* 109* 97 112*     Assessment & Plan:   Critically ill due to acute respiratory failure with ongoing ventilator dependence now status post tracheostomy. Weaning has been limited due to recurrent episodes of desaturation. This may be due to mucous plugging This may be due to pulmonary edema Critically ill due to sedation requirement  PLAN:  Continue to wean FiO2 and PEEP Trial of diuresis Continue chest physiotherapy Commence trach collar trials once on nominal ventilator settings and has passed trial spontaneous breathing. Has completed course of antibiotics Switch fentanyl patch to enteral Dilaudid to permit more rapid titration.  Wean IV fentanyl as tolerated  Best practice:  Diet: Tube feeds Pain/Anxiety/Delirium protocol (if indicated): Transitioning to oral sedation VAP protocol (if indicated): Bundle in place DVT prophylaxis: Lovenox GI prophylaxis: Famotidine Glucose control: Adequate control with no coverage Mobility: Bed rest Code Status: Full code Family Communication: No family present Disposition: Will need to identify family and next of kin for goals of care discussion.   Critical care time: 40 minutes of critical care time was spent in assessment of the patient.  Review of his ventilator  settings and radiology imaging.  Assessment of his suitability for weaning, assessment for his volume status.  Titration and modifications in sedative therapy.    Lynnell Catalan, MD [facility] ICU Physician [facility]  Pager: [phone] Mobile: [phone] After hours: [phone].  03/23/2018, 4:07 PM

## 2018-03-23 NOTE — Progress Notes (Signed)
RT called to room, pt was very agitated and SATS dropped in the 80's.  ABG drawn per E-link.  PEEP increased to 12 and Fio2 increased to 100%.  Sp02 is now 90%. Will continue to monitor.

## 2018-03-24 LAB — GLUCOSE, CAPILLARY
GLUCOSE-CAPILLARY: 112 mg/dL — AB (ref 70–99)
Glucose-Capillary: 115 mg/dL — ABNORMAL HIGH (ref 70–99)
Glucose-Capillary: 116 mg/dL — ABNORMAL HIGH (ref 70–99)

## 2018-03-24 LAB — PROTIME-INR
INR: 1.13
Prothrombin Time: 14.4 seconds (ref 11.4–15.2)

## 2018-03-24 MED ORDER — FUROSEMIDE 10 MG/ML IJ SOLN
40.0000 mg | Freq: Every day | INTRAMUSCULAR | Status: AC
  Administered 2018-03-24 – 2018-03-26 (×3): 40 mg via INTRAVENOUS
  Filled 2018-03-24 (×3): qty 4

## 2018-03-24 MED ORDER — CLONAZEPAM 0.5 MG PO TBDP
0.5000 mg | ORAL_TABLET | Freq: Three times a day (TID) | ORAL | Status: DC
  Administered 2018-03-24 – 2018-03-26 (×5): 0.5 mg
  Filled 2018-03-24 (×5): qty 1

## 2018-03-24 MED ORDER — HYDROMORPHONE HCL 1 MG/ML PO LIQD
5.0000 mg | Freq: Four times a day (QID) | ORAL | Status: DC
  Administered 2018-03-24 – 2018-03-25 (×3): 5 mg
  Filled 2018-03-24 (×3): qty 5

## 2018-03-24 NOTE — Progress Notes (Addendum)
NAME:  Brian [name], MRN:  [id number], DOB:  1955/04/30, LOS: 16 ADMISSION DATE:  March 13, 2018, CONSULTATION DATE:  03/13/18 REFERRING MD:  Lamont Snowball - ED, CHIEF COMPLAINT:  Acute respiratory failure.   Brief Summary:  62 year old man who was intubated and transferred from [facility] for hypercapneic respiratory failure in the setting of exacerbation of COPD, Rhinovirus, proteus mirabilis / H. Influenzae pneumonia. New DVT on anticoagulation. Extubated but required reintubation for hypoxia.  Completed appropriate antibiotics.  Course complicated by ETOH withdrawal and agitated delirium.  No evidence of shunt on TEE.  S/P trach, cortrak.    Past Medical History  ETOH Abuse  Cardiomyopathy  Cocaine abuse  COPD  Homelessness  Medical non-compliance  Paroxysmal VT Suicide attempt  Tobacco Abuse  DVT   Significant Events:  11/29  Admit with hypercarbic resp fx in setting of PNA + AECOPD.  DVT+ 12/05 Precedex   12/07 HFNC 15L, interactive on precedex 12/08 Re-intubated for hypoxia (? atx vs PNA) 12/09 High peep/fio2. Slowly weaning 12/10 CT chest and bubble study as FIO2 and PEEP requirements seem out of proportion to CXR findings.  12/11 Right to left shunt (small via TTE). CT chest w/ bibasilar PNA; TEE ordered. Still not weaning much. TEE was neg for sig shunt. ABX stopped.  12/12 Trach placed. CXR stable. Still requiring high FIO2 and PEEP.  12/13 Cortrack.    Procedures  ETT 12/8 >>12/12  RUE PICC 12/9 >> Trach 12/12 >>   Diagnostic tests:  DVT 11/29 >> acute DVT involving the right common femoral vein, right femoral vein, right proximal profunda CT chest 12/10 >> R>L PNA/consolidation left superior seg pulm nodules.  ECHO w/ bubble study 12/10 >> small right to left shunt.  Ejection fraction 40 to 45%, mild diffuse hypokinesis  Cultures:  Tracheal aspirate 12/2 >> proteus mirabilis >> pan sensitive                Haemophilus influenzae  Tracheal aspirate 12/8 >> few  candida  BCx2 12/8 >> negative    Antibiotics  Rocephin 12/2 >> 12/11  Interim history/subjective:  Tmax 100.2.  I/O- 1L positive in last 24 hours, 10L positive for admit.  RN reports ongoing agitated delirium.  No acute events overnight.   Objective   Blood pressure 94/61, pulse 80, temperature 100.2 F (37.9 C), temperature source Axillary, resp. rate 19, height 5\' 7"  (1.702 m), weight 85.8 kg, SpO2 93 %.    Vent Mode: PRVC FiO2 (%):  [70 %] 70 % Set Rate:  [18 bmp] 18 bmp Vt Set:  [530 mL] 530 mL PEEP:  [10 cmH20] 10 cmH20 Plateau Pressure:  [16 cmH20-18 cmH20] 16 cmH20   Intake/Output Summary (Last 24 hours) at 03/24/2018 1436 Last data filed at 03/24/2018 1200 Gross per 24 hour  Intake 2738.55 ml  Output 1070 ml  Net 1668.55 ml   Filed Weights   03/22/18 0355 03/23/18 0500 03/24/18 0500  Weight: 83.8 kg 83.8 kg 85.8 kg   General: chronically ill appearing male sitting up in bed in NAD   HEENT: MM pink/moist, trach midline c/d/i, sutures intact  Neuro: Awake, alert, intermittent agitation / thrashes head back and forth / writhing in bed, attempts to communicate by mouthing words  CV: s1s2 rrr, no m/r/g PULM: even/non-labored, lungs bilaterally diminished but clear  WU:JWJX, non-tender, bsx4 active  Extremities: warm/dry, no edema  Skin: no rashes or lesions  Recent Labs  Lab 03/19/18 0416 03/22/18 0358 03/23/18 0229  HGB 13.4  11.7* 11.9*  HCT 43.7 39.7 41.4  WBC 10.6* 9.1 8.2  PLT 202 194 219   Recent Labs  Lab 03/18/18 0312 03/19/18 0416 03/20/18 0455 03/22/18 0358 03/23/18 0229  NA 144 143 143 141 140  K 3.8 3.6 3.9 4.2 3.9  CL 96* 97* 101 101 102  CO2 36* 34* 34* 29 31  GLUCOSE 117* 173* 323* 376* 168*  BUN 33* 30* 22* 18 21*  CREATININE 0.83 0.70 0.74 0.53* 0.55*  CALCIUM 9.5 8.8* 8.7* 8.5* 8.3*  MG 2.1  --   --   --   --   PHOS 2.8  --   --   --   --     Assessment & Plan:   Acute Hypercarbic / Hypoxic Respiratory Failure  -in setting  of rhinovirus+, proteus/H.influenza PNA, AECOPD  -s/p trach 12/12  -negative bubble study (small, see above) P: PRVC 8cc/kg  Wean PEEP / FiO2 for sats 88-94% Trach care per protocol  Chest PT  Goal ATC trials  Likely will need LTAC Will need to remove trach sutures around 12/21  Duoneb QID  Nicotine patch   Pulmonary Nodules P: Will need outpatient imaging in 3-6 months   Acute on Chronic Systolic Heart Failure-EF 35%  P: Goal even to slightly negative balance  Tele monitoring  Repeat labs in am and consider further diuresis   Acute Encephalopathy / Agitated Delirium  P: Fentanyl gtt, wean to off as able Dilaudid 4mg  PT Q6 Risperdal 0.5 mg BID  Increase klonopin 0.5 mg Q8   PT / OT efforts  Consider reducing ceiling of fentanyl if remains near 250 mcg tomorrow with increase in dilaudid   R Femoral DVT with suspected pulmonary embolism. P: Continue LMWH  Dysphagia.   P: TF per protocol  Will need PEG   Urinary retention P: Continue foley cath   Hyperglycemia P: Monitor - normal off SSI    Best practice   Nutrition: TF DVT: therapeutic LMWH Glucose: n/a Sedation: PAD PUD: Pepcid   Canary Brim, NP-C [facility] Pgr: [phone] or if no answer [phone] 03/24/2018, 3:19 PM

## 2018-03-24 NOTE — Progress Notes (Signed)
ANTICOAGULATION CONSULT NOTE   Pharmacy Consult for Lovenox Indication: DVT  No Known Allergies  Patient Measurements: Height: 5\' 7"  (170.2 cm) Weight: 189 lb 2.5 oz (85.8 kg) IBW/kg (Calculated) : 66.1 Heparin Dosing Weight: 85kg  Vital Signs: Temp: 100.2 F (37.9 C) (12/15 1121) Temp Source: Axillary (12/15 1121) BP: 94/61 (12/15 1200) Pulse Rate: 80 (12/15 1200)  Labs: Recent Labs    03/22/18 0358 03/23/18 0229 03/24/18 0558  HGB 11.7* 11.9*  --   HCT 39.7 41.4  --   PLT 194 219  --   LABPROT 14.5 14.2 14.4  INR 1.14 1.11 1.13  CREATININE 0.53* 0.55*  --     Estimated Creatinine Clearance: 102.8 mL/min (A) (by C-G formula based on SCr of 0.55 mg/dL (L)).  Assessment: 62yom transfered from [facility] intubated and sedated for respiratory distress and hypotension.  New + DVT started on heparin, then switched to full-dose Lovenox.  No overt bleeding or complications noted, CBC stable.  Of note patient's weight has increased since admission but this is likely because she is 9L positive. Will keep dose as is for now 1mg /kg q12 = 80mg  q12h (wt 85kg)   12/13 enoxaparin was held for trach - restarted 12/14 CBC remained stable   Goal of Therapy:  Anti Xa level 0.6-1.2 Monitor platelets by anticoagulation protocol: Yes   Plan:  continue enoxaparin 80mg  BID F/u change to oral AC  Leota Sauers Pharm.D. CPP, BCPS Clinical Pharmacist [phone] 03/24/2018 12:21 PM

## 2018-03-25 DIAGNOSIS — J9620 Acute and chronic respiratory failure, unspecified whether with hypoxia or hypercapnia: Secondary | ICD-10-CM

## 2018-03-25 LAB — BASIC METABOLIC PANEL
Anion gap: 8 (ref 5–15)
BUN: 21 mg/dL — ABNORMAL HIGH (ref 6–20)
CO2: 33 mmol/L — AB (ref 22–32)
Calcium: 8.5 mg/dL — ABNORMAL LOW (ref 8.9–10.3)
Chloride: 99 mmol/L (ref 98–111)
Creatinine, Ser: 0.43 mg/dL — ABNORMAL LOW (ref 0.61–1.24)
GFR calc non Af Amer: >60 mL/min (ref >60)
Glucose, Bld: 135 mg/dL — ABNORMAL HIGH (ref 70–99)
Potassium: 3.9 mmol/L (ref 3.5–5.1)
SODIUM: 140 mmol/L (ref 135–145)

## 2018-03-25 LAB — GLUCOSE, CAPILLARY
Glucose-Capillary: 107 mg/dL — ABNORMAL HIGH (ref 70–99)
Glucose-Capillary: 111 mg/dL — ABNORMAL HIGH (ref 70–99)
Glucose-Capillary: 111 mg/dL — ABNORMAL HIGH (ref 70–99)
Glucose-Capillary: 112 mg/dL — ABNORMAL HIGH (ref 70–99)
Glucose-Capillary: 114 mg/dL — ABNORMAL HIGH (ref 70–99)
Glucose-Capillary: 114 mg/dL — ABNORMAL HIGH (ref 70–99)
Glucose-Capillary: 115 mg/dL — ABNORMAL HIGH (ref 70–99)
Glucose-Capillary: 115 mg/dL — ABNORMAL HIGH (ref 70–99)

## 2018-03-25 LAB — CBC
HEMATOCRIT: 39.4 % (ref 39.0–52.0)
HEMOGLOBIN: 11.8 g/dL — AB (ref 13.0–17.0)
MCH: 30.6 pg (ref 26.0–34.0)
MCHC: 29.9 g/dL — ABNORMAL LOW (ref 30.0–36.0)
MCV: 102.3 fL — ABNORMAL HIGH (ref 80.0–100.0)
Platelets: 266 10*3/uL (ref 150–400)
RBC: 3.85 MIL/uL — ABNORMAL LOW (ref 4.22–5.81)
RDW: 13.2 % (ref 11.5–15.5)
WBC: 8.5 10*3/uL (ref 4.0–10.5)
nRBC: 0 % (ref 0.0–0.2)

## 2018-03-25 MED ORDER — HYDROMORPHONE HCL 2 MG PO TABS
5.0000 mg | ORAL_TABLET | Freq: Four times a day (QID) | ORAL | Status: DC
  Administered 2018-03-25 – 2018-03-27 (×9): 5 mg via ORAL
  Filled 2018-03-25 (×9): qty 3

## 2018-03-25 MED ORDER — BISACODYL 10 MG RE SUPP: 10.0000 mg | Freq: Every day | RECTAL | Status: DC | PRN

## 2018-03-25 MED ORDER — POLYETHYLENE GLYCOL 3350 17 G PO PACK
17.0000 g | PACK | Freq: Every day | ORAL | Status: DC | PRN
  Administered 2018-03-25: 17 g via ORAL
  Filled 2018-03-25: qty 1

## 2018-03-25 NOTE — Plan of Care (Signed)
  Problem: Clinical Measurements: Goal: Ability to maintain clinical measurements within normal limits will improve Outcome: Progressing Goal: Will remain free from infection Outcome: Progressing Goal: Diagnostic test results will improve Outcome: Progressing Goal: Respiratory complications will improve Outcome: Progressing Goal: Cardiovascular complication will be avoided Outcome: Progressing   Problem: Nutrition: Goal: Adequate nutrition will be maintained Outcome: Progressing   Problem: Clinical Measurements: Goal: Ability to maintain clinical measurements within normal limits will improve Outcome: Progressing Goal: Will remain free from infection Outcome: Progressing Goal: Diagnostic test results will improve Outcome: Progressing Goal: Respiratory complications will improve Outcome: Progressing Goal: Cardiovascular complication will be avoided Outcome: Progressing   Problem: Nutrition: Goal: Adequate nutrition will be maintained Outcome: Progressing

## 2018-03-25 NOTE — Progress Notes (Signed)
NAME:  Brian [name], MRN:  [id number], DOB:  May 31, 1955, LOS: 17 ADMISSION DATE:  04-02-18, CONSULTATION DATE:  2018-04-02 REFERRING MD:  Lamont Snowball - ED, CHIEF COMPLAINT:  Acute respiratory failure.   Brief Summary:  62 year old man who was intubated and transferred from [facility] for hypercapneic respiratory failure in the setting of exacerbation of COPD, Rhinovirus, proteus mirabilis / H. Influenzae pneumonia. New DVT on anticoagulation. Extubated but required reintubation for hypoxia.  Completed appropriate antibiotics.  Course complicated by ETOH withdrawal and agitated delirium.  No evidence of shunt on TEE.  S/P trach, cortrak.    Past Medical History  ETOH Abuse  Cardiomyopathy  Cocaine abuse  COPD  Homelessness  Medical non-compliance  Paroxysmal VT Suicide attempt  Tobacco Abuse  DVT  Results for Brian [name], Brian [name] (MRN [id number]) as of 03/25/2018 13:44  Ref. Range 01/06/2018 16:12 02/24/2018 07:41  COCAINE Latest Ref Range: NONE DETECTED  POSITIVE (A) POSITIVE (A)  Tetrahydrocannabinol Latest Ref Range: NONE DETECTED  POSITIVE (A) POSITIVE (A)   Significant Events:  11/29  Admit with hypercarbic resp fx in setting of PNA + AECOPD.  DVT+ 12/05 Precedex   12/07 HFNC 15L, interactive on precedex 12/08 Re-intubated for hypoxia (? atx vs PNA) 12/09 High peep/fio2. Slowly weaning 12/10 CT chest and bubble study as FIO2 and PEEP requirements seem out of proportion to CXR findings.  12/11 Right to left shunt (small via TTE). CT chest w/ bibasilar PNA; TEE ordered. Still not weaning much. TEE was neg for sig shunt. ABX stopped.  12/12 Trach placed. CXR stable. Still requiring high FIO2 and PEEP. Trach aspirate with proteus and H flu 12/13 Cortrack.  12/15 - Tmax 100.2.  I/O- 1L positive in last 24 hours, 10L positive for admit.  RN reports ongoing agitated delirium.  No acute events overnight.     Procedures  ETT 12/8 >>12/12  RUE PICC 12/9 >> Trach 12/12 >>   Diagnostic  tests:  DVT 11/29 >> acute DVT involving the right common femoral vein, right femoral vein, right proximal profunda CT chest 12/10 >> R>L PNA/consolidation left superior seg pulm nodules.  ECHO w/ bubble study 12/10 >> small right to left shunt.  Ejection fraction 40 to 45%, mild diffuse hypokinesis  Cultures:  Tracheal aspirate 12/2 >> proteus mirabilis >> pan sensitive                Haemophilus influenzae  Tracheal aspirate 12/8 >> few candida  BCx2 12/8 >> negative    Antibiotics  Rocephin 12/2 >> 12/11  Interim history/subjective:   12/16 - fent gtt titrated off. 60% fio3/peep 5+. Follows commands. Afebrile  Objective   Blood pressure 96/64, pulse 79, temperature 99.3 F (37.4 C), temperature source Oral, resp. rate 18, height 5\' 7"  (1.702 m), weight 86.3 kg, SpO2 91 %.    Vent Mode: PRVC FiO2 (%):  [60 %-70 %] 60 % Set Rate:  [18 bmp] 18 bmp Vt Set:  [530 mL] 530 mL PEEP:  [10 cmH20] 10 cmH20 Plateau Pressure:  [12 cmH20-21 cmH20] 21 cmH20   Intake/Output Summary (Last 24 hours) at 03/25/2018 1341 Last data filed at 03/25/2018 1100 Gross per 24 hour  Intake 1979.3 ml  Output 1945 ml  Net 34.3 ml   Filed Weights   03/23/18 0500 03/24/18 0500 03/25/18 0500  Weight: 83.8 kg 85.8 kg 86.3 kg    General Appearance:  Looks criticall ill Head:  Normocephalic, without obvious abnormality, atraumatic Eyes:  PERRL - yes, conjunctiva/corneas -  muddy     Ears:  Normal external ear canals, both ears Nose:  G tube - yes Throat:  ETT TUBE - no , OG tube - no. TRACH + Neck:  Supple,  No enlargement/tenderness/nodules Lungs: Clear to auscultation bilaterally, Ventilator   Synchrony - yes Heart:  S1 and S2 normal, no murmur, CVP - no.  Pressors - no Abdomen:  Soft, no masses, no organomegaly Genitalia / Rectal:  Not done Extremities:  Extremities- intact Skin:  ntact in exposed areas . Sacral area - not examined Neurologic:  Sedation - ? fent gtt -> RASS - -1 . Moves all 4s  - yes. CAM-ICU - follows commands     LABS    PULMONARY Recent Labs  Lab 03/19/18 0830 03/20/18 0855 03/20/18 0920 03/23/18 0347  PHART 7.455* 7.412 7.367 7.361  PCO2ART 52.1* 56.0* 64.4* 55.0*  PO2ART 71.0* 38.0* 88.0 53.0*  HCO3 36.1* 35.2* 36.5* 31.0*  TCO2  --  37* 38* 33*  O2SAT 94.8 67.0 95.0 83.0    CBC Recent Labs  Lab 03/22/18 0358 03/23/18 0229 03/25/18 0511  HGB 11.7* 11.9* 11.8*  HCT 39.7 41.4 39.4  WBC 9.1 8.2 8.5  PLT 194 219 266    COAGULATION Recent Labs  Lab 03/20/18 0455 03/21/18 0407 03/22/18 0358 03/23/18 0229 03/24/18 0558  INR 1.10 1.10 1.14 1.11 1.13    CARDIAC  No results for input(s): TROPONINI in the last 168 hours. No results for input(s): PROBNP in the last 168 hours.   CHEMISTRY Recent Labs  Lab 03/19/18 0416 03/20/18 0455 03/22/18 0358 03/23/18 0229 03/25/18 0511  NA 143 143 141 140 140  K 3.6 3.9 4.2 3.9 3.9  CL 97* 101 101 102 99  CO2 34* 34* 29 31 33*  GLUCOSE 173* 323* 376* 168* 135*  BUN 30* 22* 18 21* 21*  CREATININE 0.70 0.74 0.53* 0.55* 0.43*  CALCIUM 8.8* 8.7* 8.5* 8.3* 8.5*   Estimated Creatinine Clearance: 103.1 mL/min (A) (by C-G formula based on SCr of 0.43 mg/dL (L)).   LIVER Recent Labs  Lab 03/19/18 0416 03/20/18 0455 03/21/18 0407 03/22/18 0358 03/23/18 0229 03/24/18 0558  AST 23 22  --  29 40  --   ALT 38 33  --  38 49*  --   ALKPHOS 36* 33*  --  34* 50  --   BILITOT 0.4 0.2*  --  0.3 0.3  --   PROT 5.9* 5.6*  --  5.3* 5.5*  --   ALBUMIN 2.6* 2.1*  --  1.9* 2.0*  --   INR 1.11 1.10 1.10 1.14 1.11 1.13     INFECTIOUS No results for input(s): LATICACIDVEN, PROCALCITON in the last 168 hours.   ENDOCRINE CBG (last 3)  Recent Labs    03/25/18 0347 03/25/18 0749 03/25/18 1129  GLUCAP 107* 111* 111*         IMAGING x48h  - image(s) personally visualized  -   highlighted in bold No results found.   Assessment & Plan:   Acute Hypercarbic / Hypoxic Respiratory  Failure  -in setting of rhinovirus+, proteus/H.influenza PNA, AECOPD  -s/p trach 12/12  -negative bubble study (small, see above)  12/16- full vent support 60% fio2  P: PRVC Trach care per protocol  Chest PT  Goal ATC trials  Likely will need LTAC Will need to remove trach sutures around 12/21  Duoneb QID  Nicotine patch   Pulmonary Nodules P: Will need outpatient imaging in 3-6 months   Acute  on Chronic Systolic Heart Failure-EF 35%  P: Goal even to slightly negative balance  Tele monitoring  Repeat labs in am and consider further diuresis   Acute Encephalopathy / Agitated Delirium   12/16- able to come off fent gtt P: Fentanyl gtt, wean to off as able Dilaudid 4mg  PT Q6 Risperdal 0.5 mg BID  Increase klonopin 0.5 mg Q8   PT / OT efforts  Consider reducing ceiling of fentanyl if remains near 250 mcg tomorrow with increase in dilaudid  Consider precedex gtt if needed  R Femoral DVT with suspected pulmonary embolism. P: Continue LMWH  Dysphagia.   P: TF per protocol  Will need PEG   Urinary retention P: Continue foley cath   Hyperglycemia P: Monitor - normal off SSI    Best practice   Nutrition: TF DVT: therapeutic LMWH Glucose: n/a Sedation: PAD PUD: Pepcid  Family - none at bedside Dispo - ICU. IF stable, consider SDU 03/26/18. LTAC referral placed  ATTESTATION & SIGNATURE   The patient AVEN ESSA is critically ill with multiple organ systems failure and requires high complexity decision making for assessment and support, frequent evaluation and titration of therapies, application of advanced monitoring technologies and extensive interpretation of multiple databases.   Critical Care Time devoted to patient care services described in this note is  30  Minutes. This time reflects time of care of this signee Dr Kalman Shan. This critical care time does not reflect procedure time, or teaching time or supervisory time of PA/NP/Med  student/Med Resident etc but could involve care discussion time     Dr. Kalman Shan, M.D., [facility].C.P Pulmonary and Critical Care Medicine Staff Physician [facility] [place] [facility] Pager: [phone], If no answer or between  15:00h - 7:00h: call [phone]  03/25/2018 1:41 PM

## 2018-03-25 NOTE — Progress Notes (Signed)
SLP Cancellation Note  Patient Details Name: Brian [name] MRN: [id number] DOB: 1955/05/20   Cancelled:   Orders for swallowing/PMV still stand in chart - SLP has been monitoring for potential candidacy for inline PMV.  At this time, PEEP and Fi02 requirements hover at the threshold for appropriate candidacy.  Will continue to follow.  [name] L. [name], MA CCC/SLP Acute Rehabilitation Services Office number [phone] Pager [phone]         [name] 03/25/2018, 4:24 PM

## 2018-03-26 DIAGNOSIS — J9600 Acute respiratory failure, unspecified whether with hypoxia or hypercapnia: Secondary | ICD-10-CM

## 2018-03-26 LAB — CBC WITH DIFFERENTIAL/PLATELET
Abs Immature Granulocytes: 0.03 10*3/uL (ref 0.00–0.07)
BASOS ABS: 0 10*3/uL (ref 0.0–0.1)
Basophils Relative: 0 %
Eosinophils Absolute: 0.3 10*3/uL (ref 0.0–0.5)
Eosinophils Relative: 3 %
HCT: 40.3 % (ref 39.0–52.0)
Hemoglobin: 12.2 g/dL — ABNORMAL LOW (ref 13.0–17.0)
Immature Granulocytes: 0 %
Lymphocytes Relative: 21 %
Lymphs Abs: 2 10*3/uL (ref 0.7–4.0)
MCH: 30.2 pg (ref 26.0–34.0)
MCHC: 30.3 g/dL (ref 30.0–36.0)
MCV: 99.8 fL (ref 80.0–100.0)
Monocytes Absolute: 1 10*3/uL (ref 0.1–1.0)
Monocytes Relative: 11 %
NRBC: 0 % (ref 0.0–0.2)
Neutro Abs: 6.3 10*3/uL (ref 1.7–7.7)
Neutrophils Relative %: 65 %
Platelets: 277 10*3/uL (ref 150–400)
RBC: 4.04 MIL/uL — ABNORMAL LOW (ref 4.22–5.81)
RDW: 13 % (ref 11.5–15.5)
WBC: 9.6 10*3/uL (ref 4.0–10.5)

## 2018-03-26 LAB — BASIC METABOLIC PANEL
Anion gap: 12 (ref 5–15)
BUN: 22 mg/dL — ABNORMAL HIGH (ref 6–20)
CO2: 33 mmol/L — AB (ref 22–32)
Calcium: 9.1 mg/dL (ref 8.9–10.3)
Chloride: 95 mmol/L — ABNORMAL LOW (ref 98–111)
Creatinine, Ser: 0.56 mg/dL — ABNORMAL LOW (ref 0.61–1.24)
GFR calc non Af Amer: >60 mL/min (ref >60)
Glucose, Bld: 110 mg/dL — ABNORMAL HIGH (ref 70–99)
Potassium: 3.9 mmol/L (ref 3.5–5.1)
Sodium: 140 mmol/L (ref 135–145)

## 2018-03-26 LAB — GLUCOSE, CAPILLARY
Glucose-Capillary: 107 mg/dL — ABNORMAL HIGH (ref 70–99)
Glucose-Capillary: 107 mg/dL — ABNORMAL HIGH (ref 70–99)
Glucose-Capillary: 111 mg/dL — ABNORMAL HIGH (ref 70–99)
Glucose-Capillary: 133 mg/dL — ABNORMAL HIGH (ref 70–99)
Glucose-Capillary: 79 mg/dL (ref 70–99)

## 2018-03-26 LAB — PHOSPHORUS: Phosphorus: 3.6 mg/dL (ref 2.5–4.6)

## 2018-03-26 LAB — MAGNESIUM: Magnesium: 1.8 mg/dL (ref 1.7–2.4)

## 2018-03-26 MED ORDER — CLONAZEPAM 0.5 MG PO TBDP
0.5000 mg | ORAL_TABLET | Freq: Four times a day (QID) | ORAL | Status: DC
  Administered 2018-03-26 – 2018-04-02 (×29): 0.5 mg
  Filled 2018-03-26 (×27): qty 1
  Filled 2018-03-26: qty 2
  Filled 2018-03-26: qty 1

## 2018-03-26 MED ORDER — MAGNESIUM SULFATE 2 GM/50ML IV SOLN: 2.0000 g | Freq: Once | INTRAVENOUS | Status: DC

## 2018-03-26 MED ORDER — POTASSIUM CHLORIDE 20 MEQ/15ML (10%) PO SOLN
40.0000 meq | Freq: Once | ORAL | Status: AC
  Administered 2018-03-26: 40 meq
  Filled 2018-03-26: qty 30

## 2018-03-26 MED ORDER — MAGNESIUM SULFATE 2 GM/50ML IV SOLN
2.0000 g | Freq: Once | INTRAVENOUS | Status: AC
  Administered 2018-03-26: 2 g via INTRAVENOUS
  Filled 2018-03-26: qty 50

## 2018-03-26 MED ORDER — FAMOTIDINE 40 MG/5ML PO SUSR
20.0000 mg | Freq: Two times a day (BID) | ORAL | Status: DC
  Administered 2018-03-26 – 2018-04-06 (×23): 20 mg
  Filled 2018-03-26 (×22): qty 2.5

## 2018-03-26 NOTE — Progress Notes (Addendum)
Nutrition Follow-up  DOCUMENTATION CODES:   Not applicable  INTERVENTION:   Tube Feeding:  Continue Vital AF 1.2 @ 70 ml/hr Provide 126 g of protein, 2016 kcals, 1361 mL of free water Meets 100% estimated protein and calorie needs   NUTRITION DIAGNOSIS:   Inadequate oral intake related to inability to eat as evidenced by NPO status.  Being addressed via TF  GOAL:   Patient will meet greater than or equal to 90% of their needs  Progressing  MONITOR:   Vent status, Labs, I & O's, Weight trends, TF tolerance  REASON FOR ASSESSMENT:   Ventilator, Consult Enteral/tube feeding initiation and management  ASSESSMENT:   62 year old male who presented to the [facility] ED on 11/29 with acute respiratory failure. Pt was intubated in the ED. PMH significant for COPD, CHF, substance use, tobacco use, and multiple prior hospitalizations with pt leaving AMA.  11/29 Intubated 12/03 Extubated 12/04 NPO, Cortrak Placed 12/08 Re-Intubated 12/12 Trach Placed, Cortrak clogged 12/13 Cortrak Re-placed, post-pyloric  Patient remains on vent support via trach MV: 8.1 L/min Temp (24hrs), Avg:99.5 F (37.5 C), Min:98.3 F (36.8 C), Max:101.6 F (38.7 C)  Tolerating Vital AF 1.2 @ 70 ml/hr via Cortrak  Utilizing 80.7 kg as EDW; current wt 86 kg, admission wt 89.8 kg. Net +11 Lper I/O flow sheet  Labs: reviewed Meds: thiamine, folic acid   Diet Order:   Diet Order    None      EDUCATION NEEDS:   Not appropriate for education at this time  Skin:  Skin Assessment: Skin Integrity Issues: Skin Integrity Issues:: Stage II Stage II: sacrum  Last BM:  12/13 (pt has been receiving prn bowel regimen)  Height:   Ht Readings from Last 1 Encounters:  03-Apr-2018 5\' 7"  (1.702 m)    Weight:   Wt Readings from Last 1 Encounters:  03/26/18 86 kg    Ideal Body Weight:  67.3 kg  BMI:  Body mass index is 29.69 kg/m.  Estimated Nutritional Needs:   Kcal:  2012 kcals   Protein:   120-145 g  Fluid:  >/= 2 L   Romelle Starcher MS, RD, LDN, CNSC [phone] Pager  [phone] Weekend/On-Call Pager

## 2018-03-26 NOTE — Progress Notes (Signed)
eLink Physician-Brief Progress Note Patient Name: SAVIEN HRDLICKA DOB: 1956/03/06 MRN: [id number]   Date of Service  03/26/2018  HPI/Events of Note  Agitation - Request to renew restraint orders which have expired.   eICU Interventions  Will renew bilateral soft wrist restraints X 6 hours. Please have day rounding team renew the order for 24 hours.      Intervention Category Minor Interventions: Agitation / anxiety - evaluation and management  Sommer,Steven Eugene 03/26/2018, 2:10 AM

## 2018-03-26 NOTE — Progress Notes (Signed)
NAME:  Brian [name], MRN:  [id number], DOB:  12/03/1955, LOS: 18 ADMISSION DATE:  03/21/18, CONSULTATION DATE:  Mar 21, 2018 REFERRING MD:  Brian [name] - ED, CHIEF COMPLAINT:  Acute respiratory failure.   Brief Summary:  62 year old man who was intubated and transferred from [facility] for hypercapneic respiratory failure in the setting of exacerbation of COPD, Rhinovirus, proteus mirabilis / H. Influenzae pneumonia. New DVT on anticoagulation. Extubated but required reintubation for hypoxia.  Completed appropriate antibiotics.  Course complicated by ETOH withdrawal and agitated delirium.  No evidence of shunt on TEE.  S/P trach, cortrak.    Past Medical History  ETOH Abuse  Cardiomyopathy  Cocaine abuse  COPD  Homelessness  Medical non-compliance  Paroxysmal VT Suicide attempt  Tobacco Abuse  DVT  Results for Brian [name], Brian [name] (MRN [id number]) as of 03/25/2018 13:44  Ref. Range 01/06/2018 16:12 02/24/2018 07:41  COCAINE Latest Ref Range: NONE DETECTED  POSITIVE (A) POSITIVE (A)  Tetrahydrocannabinol Latest Ref Range: NONE DETECTED  POSITIVE (A) POSITIVE (A)   Significant Events:  11/29  Admit with hypercarbic resp fx in setting of PNA + AECOPD.  DVT+ 12/05 Precedex   12/07 HFNC 15L, interactive on precedex 12/08 Re-intubated for hypoxia (? atx vs PNA) 12/09 High peep/fio2. Slowly weaning 12/10 CT chest and bubble study as FIO2 and PEEP requirements seem out of proportion to CXR findings.  12/11 Right to left shunt (small via TTE). CT chest w/ bibasilar PNA; TEE ordered. Still not weaning much. TEE was neg for sig shunt. ABX stopped.  12/12 Trach placed. CXR stable. Still requiring high FIO2 and PEEP. Trach aspirate with proteus and H flu 12/13 Cortrack.  12/15 - Tmax 100.2.  I/O- 1L positive in last 24 hours, 10L positive for admit.  RN reports ongoing agitated delirium.  No acute events overnight.  12/16 - fent gtt titrated off. 60% fio3/peep 5+. Follows commands.  Afebrile  Procedures  ETT 12/8 >>12/12  RUE PICC 12/9 >> Trach 12/12 >>   Diagnostic tests:  DVT 11/29 >> acute DVT involving the right common femoral vein, right femoral vein, right proximal profunda CT chest 12/10 >> R>L PNA/consolidation left superior seg pulm nodules.  ECHO w/ bubble study 12/10 >> small right to left shunt.  Ejection fraction 40 to 45%, mild diffuse hypokinesis  Cultures:  Tracheal aspirate 12/2 >> proteus mirabilis >> pan sensitive                Haemophilus influenzae  Tracheal aspirate 12/8 >> neg BCx2 12/8 >> negative  RVP 11/29 >> rhinovirus   Antibiotics  Rocephin 12/2 >> 12/11  Interim history/subjective:  Tmax 101.6 yest, normal today No events overnight Currently weaned to 50% and 8 peep  Fentanyl gtt remains at 200 ml/hr with patient intermittently trying to get out of bed  Objective   Blood pressure 122/70, pulse 83, temperature 98.3 F (36.8 C), resp. rate 20, height 5\' 7"  (1.702 m), weight 86 kg, SpO2 (!) 87 %.    Vent Mode: PRVC FiO2 (%):  [50 %-60 %] 50 % Set Rate:  [18 bmp] 18 bmp Vt Set:  [510 mL-530 mL] 510 mL PEEP:  [10 cmH20] 10 cmH20 Plateau Pressure:  [17 cmH20-21 cmH20] 17 cmH20   Intake/Output Summary (Last 24 hours) at 03/26/2018 0959 Last data filed at 03/26/2018 0900 Gross per 24 hour  Intake 2421.09 ml  Output 2695 ml  Net -273.91 ml   Filed Weights   03/24/18 0500 03/25/18 0500 03/26/18 0500  Weight: 85.8 kg 86.3 kg 86 kg   General:  Chronically ill, older than stated age male in NAD HEENT: MM pink/moist, poor dentition, Left nare cortrak, 6 shiley cuffed trach- sutured, empiric pressure dressing some old bloody secretions around site- difficult to see due to patients beard Neuro: Awakens, follows commands, still attempts to sit up and climb out of bed, requiring restraints CV: RR, no murmur PULM: even/non-labored on MV, lungs bilaterally clear GI: soft, non-tender, bs active  Extremities: warm/dry, mild  generalized edema Skin: no rashes    LABS    PULMONARY Recent Labs  Lab 03/20/18 0855 03/20/18 0920 03/23/18 0347  PHART 7.412 7.367 7.361  PCO2ART 56.0* 64.4* 55.0*  PO2ART 38.0* 88.0 53.0*  HCO3 35.2* 36.5* 31.0*  TCO2 37* 38* 33*  O2SAT 67.0 95.0 83.0    CBC Recent Labs  Lab 03/23/18 0229 03/25/18 0511 03/26/18 0222  HGB 11.9* 11.8* 12.2*  HCT 41.4 39.4 40.3  WBC 8.2 8.5 9.6  PLT 219 266 277    COAGULATION Recent Labs  Lab 03/20/18 0455 03/21/18 0407 03/22/18 0358 03/23/18 0229 03/24/18 0558  INR 1.10 1.10 1.14 1.11 1.13    CARDIAC  No results for input(s): TROPONINI in the last 168 hours. No results for input(s): PROBNP in the last 168 hours.   CHEMISTRY Recent Labs  Lab 03/20/18 0455 03/22/18 0358 03/23/18 0229 03/25/18 0511 03/26/18 0222  NA 143 141 140 140 140  K 3.9 4.2 3.9 3.9 3.9  CL 101 101 102 99 95*  CO2 34* 29 31 33* 33*  GLUCOSE 323* 376* 168* 135* 110*  BUN 22* 18 21* 21* 22*  CREATININE 0.74 0.53* 0.55* 0.43* 0.56*  CALCIUM 8.7* 8.5* 8.3* 8.5* 9.1  MG  --   --   --   --  1.8  PHOS  --   --   --   --  3.6   Estimated Creatinine Clearance: 102.9 mL/min (A) (by C-G formula based on SCr of 0.56 mg/dL (L)).   LIVER Recent Labs  Lab 03/20/18 0455 03/21/18 0407 03/22/18 0358 03/23/18 0229 03/24/18 0558  AST 22  --  29 40  --   ALT 33  --  38 49*  --   ALKPHOS 33*  --  34* 50  --   BILITOT 0.2*  --  0.3 0.3  --   PROT 5.6*  --  5.3* 5.5*  --   ALBUMIN 2.1*  --  1.9* 2.0*  --   INR 1.10 1.10 1.14 1.11 1.13     INFECTIOUS No results for input(s): LATICACIDVEN, PROCALCITON in the last 168 hours.   ENDOCRINE CBG (last 3)  Recent Labs    03/26/18 0003 03/26/18 0344 03/26/18 0831  GLUCAP 79 107* 107*   IMAGING x48h  - image(s) personally visualized  -   highlighted in bold No results found.   Assessment & Plan:   Acute Hypercarbic / Hypoxic Respiratory Failure  -in setting of rhinovirus+,  proteus/H.influenza PNA, AECOPD  -s/p trach 12/12  -negative bubble study (small, see above) P: 12/17-PRVC 8 cc/kg/ rate 18-   full vent support weaned today to 50%/ 8 peep, if tolerates could start SBT trials Trach care per protocol  Chest PT  Goal ATC trials eventually Likely will need LTAC Will need to remove trach sutures around 12/21  Duoneb QID  Nicotine patch   Pulmonary Nodules P: Will need outpatient imaging in 3-6 months   Acute on Chronic Systolic Heart Failure-EF 35%  -  neg even balance/ remains net +10L, good UOP P: Tele monitoring Goal even to slightly negative balance daily Renal function stable/ trend Additional lasix 40mg  and KCL 40 meq  Mag 2gm now  Acute Encephalopathy / Agitated Delirium  P: Fentanyl gtt, wean to off as able Dilaudid 5mg  PT Q6 Risperdal 0.5 mg BID  Increase klonopin 0.5 mg Q6   PT / OT efforts  Consider reducing ceiling of fentanyl if remains near 250 mcg tomorrow with increase in dilaudid  Consider precedex gtt if needed  R Femoral DVT with suspected pulmonary embolism. P: Continue LMWH  Dysphagia.   P: TF per protocol  Will need PEG   Urinary retention P: Continue foley cath   Hyperglycemia P: Monitor - normal off SSI    Best practice   Nutrition: TF DVT: therapeutic LMWH Glucose: n/a Sedation: PAD PUD: Pepcid  Family - none at bedside 12/17 Dispo  LTAC referral placed  CCT 35 mins  Posey Boyer, AGACNP-BC [facility] Pgr: [phone] or if no answer [phone] 03/26/2018, 9:59 AM

## 2018-03-27 ENCOUNTER — Inpatient Hospital Stay (HOSPITAL_COMMUNITY): Payer: Medicaid Other

## 2018-03-27 DIAGNOSIS — J9621 Acute and chronic respiratory failure with hypoxia: Secondary | ICD-10-CM

## 2018-03-27 LAB — GLUCOSE, CAPILLARY
GLUCOSE-CAPILLARY: 105 mg/dL — AB (ref 70–99)
GLUCOSE-CAPILLARY: 106 mg/dL — AB (ref 70–99)
Glucose-Capillary: 104 mg/dL — ABNORMAL HIGH (ref 70–99)
Glucose-Capillary: 108 mg/dL — ABNORMAL HIGH (ref 70–99)
Glucose-Capillary: 107 mg/dL — ABNORMAL HIGH (ref 70–99)
Glucose-Capillary: 128 mg/dL — ABNORMAL HIGH (ref 70–99)
Glucose-Capillary: 105 mg/dL — ABNORMAL HIGH (ref 70–99)

## 2018-03-27 LAB — BASIC METABOLIC PANEL
Anion gap: 10 (ref 5–15)
BUN: 18 mg/dL (ref 6–20)
CO2: 33 mmol/L — ABNORMAL HIGH (ref 22–32)
Calcium: 9.2 mg/dL (ref 8.9–10.3)
Chloride: 96 mmol/L — ABNORMAL LOW (ref 98–111)
Creatinine, Ser: 0.56 mg/dL — ABNORMAL LOW (ref 0.61–1.24)
Glucose, Bld: 111 mg/dL — ABNORMAL HIGH (ref 70–99)
Potassium: 4.3 mmol/L (ref 3.5–5.1)
Sodium: 139 mmol/L (ref 135–145)

## 2018-03-27 LAB — CBC WITH DIFFERENTIAL/PLATELET
ABS IMMATURE GRANULOCYTES: 0.06 10*3/uL (ref 0.00–0.07)
Basophils Absolute: 0 10*3/uL (ref 0.0–0.1)
Basophils Relative: 0 %
Eosinophils Absolute: 0.3 10*3/uL (ref 0.0–0.5)
Eosinophils Relative: 3 %
HCT: 42.2 % (ref 39.0–52.0)
Hemoglobin: 12.7 g/dL — ABNORMAL LOW (ref 13.0–17.0)
Immature Granulocytes: 1 %
Lymphocytes Relative: 16 %
Lymphs Abs: 1.8 10*3/uL (ref 0.7–4.0)
MCH: 30.2 pg (ref 26.0–34.0)
MCHC: 30.1 g/dL (ref 30.0–36.0)
MCV: 100.5 fL — ABNORMAL HIGH (ref 80.0–100.0)
Monocytes Absolute: 1.3 10*3/uL — ABNORMAL HIGH (ref 0.1–1.0)
Monocytes Relative: 11 %
NEUTROS ABS: 7.7 10*3/uL (ref 1.7–7.7)
Neutrophils Relative %: 69 %
Platelets: 318 10*3/uL (ref 150–400)
RBC: 4.2 MIL/uL — ABNORMAL LOW (ref 4.22–5.81)
RDW: 12.9 % (ref 11.5–15.5)
WBC: 11.2 10*3/uL — ABNORMAL HIGH (ref 4.0–10.5)
nRBC: 0 % (ref 0.0–0.2)

## 2018-03-27 LAB — PHOSPHORUS: PHOSPHORUS: 3.6 mg/dL (ref 2.5–4.6)

## 2018-03-27 LAB — MAGNESIUM: Magnesium: 1.9 mg/dL (ref 1.7–2.4)

## 2018-03-27 MED ORDER — HYDROMORPHONE HCL 1 MG/ML IJ SOLN: 1.0000 mg | INTRAMUSCULAR | Status: DC | PRN

## 2018-03-27 MED ORDER — HYDROMORPHONE BOLUS VIA INFUSION
1.0000 mg | INTRAVENOUS | Status: DC | PRN
  Administered 2018-03-27: 2 mg via INTRAVENOUS
  Administered 2018-03-27 – 2018-03-28 (×2): 4 mg via INTRAVENOUS
  Administered 2018-03-28 – 2018-03-29 (×8): 2 mg via INTRAVENOUS
  Administered 2018-03-30: 4 mg via INTRAVENOUS
  Administered 2018-03-30: 2 mg via INTRAVENOUS
  Administered 2018-03-30: 4 mg via INTRAVENOUS
  Administered 2018-03-30: 2 mg via INTRAVENOUS
  Administered 2018-03-31 – 2018-04-01 (×6): 4 mg via INTRAVENOUS
  Administered 2018-04-01 (×2): 2 mg via INTRAVENOUS
  Administered 2018-04-02: 3 mg via INTRAVENOUS
  Administered 2018-04-02: 2 mg via INTRAVENOUS
  Administered 2018-04-02 (×2): 3 mg via INTRAVENOUS
  Administered 2018-04-04 (×3): 2 mg via INTRAVENOUS
  Filled 2018-03-27: qty 4

## 2018-03-27 MED ORDER — DEXMEDETOMIDINE HCL IN NACL 200 MCG/50ML IV SOLN: 0.4000 ug/kg/h | INTRAVENOUS | Status: DC

## 2018-03-27 MED ORDER — FUROSEMIDE 10 MG/ML IJ SOLN
40.0000 mg | Freq: Three times a day (TID) | INTRAMUSCULAR | Status: DC
  Administered 2018-03-27 – 2018-04-03 (×21): 40 mg via INTRAVENOUS
  Filled 2018-03-27 (×21): qty 4

## 2018-03-27 MED ORDER — MAGNESIUM SULFATE 2 GM/50ML IV SOLN
2.0000 g | Freq: Once | INTRAVENOUS | Status: AC
  Administered 2018-03-27: 2 g via INTRAVENOUS
  Filled 2018-03-27: qty 50

## 2018-03-27 MED ORDER — SODIUM CHLORIDE 0.9 % IV SOLN
0.5000 mg/h | INTRAVENOUS | Status: DC
  Administered 2018-03-27: 0.5 mg/h via INTRAVENOUS
  Administered 2018-03-28: 1 mg/h via INTRAVENOUS
  Administered 2018-03-29: 1.5 mg/h via INTRAVENOUS
  Administered 2018-03-30 – 2018-04-02 (×5): 2 mg/h via INTRAVENOUS
  Administered 2018-04-04 – 2018-04-06 (×2): 1 mg/h via INTRAVENOUS
  Administered 2018-04-06 – 2018-04-07 (×3): 8 mg/h via INTRAVENOUS
  Filled 2018-03-27 (×14): qty 5

## 2018-03-27 MED ORDER — DEXMEDETOMIDINE HCL IN NACL 200 MCG/50ML IV SOLN
0.4000 ug/kg/h | INTRAVENOUS | Status: DC
  Administered 2018-03-27: 0.7 ug/kg/h via INTRAVENOUS
  Administered 2018-03-27 (×2): 0.5 ug/kg/h via INTRAVENOUS
  Administered 2018-03-28: 0.4 ug/kg/h via INTRAVENOUS
  Administered 2018-03-28: 0.5 ug/kg/h via INTRAVENOUS
  Administered 2018-03-28: 0.4 ug/kg/h via INTRAVENOUS
  Administered 2018-03-28 (×2): 0.5 ug/kg/h via INTRAVENOUS
  Administered 2018-03-29: 0.6 ug/kg/h via INTRAVENOUS
  Administered 2018-03-29: 0.7 ug/kg/h via INTRAVENOUS
  Administered 2018-03-29: 0.4 ug/kg/h via INTRAVENOUS
  Administered 2018-03-29: 0.7 ug/kg/h via INTRAVENOUS
  Administered 2018-03-29: 0.4 ug/kg/h via INTRAVENOUS
  Administered 2018-03-30: 0.9 ug/kg/h via INTRAVENOUS
  Administered 2018-03-30: 1.2 ug/kg/h via INTRAVENOUS
  Administered 2018-03-30: 0.9 ug/kg/h via INTRAVENOUS
  Administered 2018-03-30: 1 ug/kg/h via INTRAVENOUS
  Administered 2018-03-30 (×2): 0.9 ug/kg/h via INTRAVENOUS
  Administered 2018-03-30: 1.2 ug/kg/h via INTRAVENOUS
  Administered 2018-03-30: 1 ug/kg/h via INTRAVENOUS
  Administered 2018-03-30 (×2): 0.9 ug/kg/h via INTRAVENOUS
  Administered 2018-03-31: 1.2 ug/kg/h via INTRAVENOUS
  Administered 2018-03-31: 0.9 ug/kg/h via INTRAVENOUS
  Administered 2018-03-31: 1.2 ug/kg/h via INTRAVENOUS
  Administered 2018-03-31: 1 ug/kg/h via INTRAVENOUS
  Administered 2018-03-31: 1.2 ug/kg/h via INTRAVENOUS
  Filled 2018-03-27 (×3): qty 50
  Filled 2018-03-27: qty 100
  Filled 2018-03-27 (×8): qty 50
  Filled 2018-03-27: qty 100
  Filled 2018-03-27 (×13): qty 50

## 2018-03-27 NOTE — Progress Notes (Signed)
CPT held at this time due to patient is slid down in the bed, legs off bed, almost lying flat. Patient is agitated and not cooperating.

## 2018-03-27 NOTE — Progress Notes (Signed)
NAME:  Brian [name], MRN:  [id number], DOB:  04-Aug-1955, LOS: 19 ADMISSION DATE:  03/30/2018, CONSULTATION DATE:  03-30-2018 REFERRING MD:  Lamont Snowball - ED, CHIEF COMPLAINT:  Acute respiratory failure.   Brief Summary:  62 year old man who was intubated and transferred from [facility] for hypercapneic respiratory failure in the setting of exacerbation of COPD, Rhinovirus, proteus mirabilis / H. Influenzae pneumonia. New DVT on anticoagulation. Extubated but required reintubation for hypoxia.  Completed appropriate antibiotics.  Course complicated by ETOH withdrawal and agitated delirium.  No evidence of shunt on TEE.  S/P trach, cortrak.    Past Medical History  ETOH Abuse  Cardiomyopathy  Cocaine abuse  COPD  Homelessness  Medical non-compliance  Paroxysmal VT Suicide attempt  Tobacco Abuse  DVT  Results for Brian, [name] (MRN [id number]) as of 03/25/2018 13:44  Ref. Range 01/06/2018 16:12 02/24/2018 07:41  COCAINE Latest Ref Range: NONE DETECTED  POSITIVE (A) POSITIVE (A)  Tetrahydrocannabinol Latest Ref Range: NONE DETECTED  POSITIVE (A) POSITIVE (A)   Significant Events:  11/29  Admit with hypercarbic resp fx in setting of PNA + AECOPD.  DVT+ 12/05 Precedex   12/07 HFNC 15L, interactive on precedex 12/08 Re-intubated for hypoxia (? atx vs PNA) 12/09 High peep/fio2. Slowly weaning 12/10 CT chest and bubble study as FIO2 and PEEP requirements seem out of proportion to CXR findings.  12/11 Right to left shunt (small via TTE). CT chest w/ bibasilar PNA; TEE ordered. Still not weaning much. TEE was neg for sig shunt. ABX stopped.  12/12 Trach placed. CXR stable. Still requiring high FIO2 and PEEP. Trach aspirate with proteus and H flu 12/13 Cortrack.  12/15 - Tmax 100.2.  I/O- 1L positive in last 24 hours, 10L positive for admit.  RN reports ongoing agitated delirium.  No acute events overnight.  12/16 - fent gtt titrated off. 60% fio3/peep 5+. Follows commands. Afebrile 12/17 -  Tmax 101.6 yest, normal today. No events overnight. Currently weaned to 50% and 8 peep  Fentanyl gtt remains at 200 ml/hr with patient intermittently trying to get out of bed  Procedures  ETT 12/8 >>12/12  RUE PICC 12/9 >> Trach 12/12 >>   Diagnostic tests:  DVT 11/29 >> acute DVT involving the right common femoral vein, right femoral vein, right proximal profunda CT chest 12/10 >> R>L PNA/consolidation left superior seg pulm nodules.  ECHO w/ bubble study 12/10 >> small right to left shunt.  Ejection fraction 40 to 45%, mild diffuse hypokinesis  Cultures:  Tracheal aspirate 12/2 >> proteus mirabilis >> pan sensitive                Haemophilus influenzae  Tracheal aspirate 12/8 >> neg BCx2 12/8 >> negative  RVP 11/29 >> rhinovirus   Antibiotics  Rocephin 12/2 >> 12/11  Interim history/subjective:   12/18 -On fent gtt, risperdan scheduled, nicotine patch, ativan prn and dialudid prn -> still Ongoing agitation itermittently. CPT held. On 70% fio2 and peep 8   Objective   Blood pressure 126/85, pulse 95, temperature 99.1 F (37.3 C), temperature source Oral, resp. rate (!) 29, height 5\' 7"  (1.702 m), weight 86.6 kg, SpO2 98 %.    Vent Mode: PRVC FiO2 (%):  [50 %-90 %] 70 % Set Rate:  [18 bmp] 18 bmp Vt Set:  [510 mL] 510 mL PEEP:  [8 cmH20] 8 cmH20 Plateau Pressure:  [15 cmH20] 15 cmH20   Intake/Output Summary (Last 24 hours) at 03/27/2018 1211 Last data filed at  03/27/2018 1000 Gross per 24 hour  Intake 2033.31 ml  Output 1050 ml  Net 983.31 ml   Filed Weights   03/25/18 0500 03/26/18 0500 03/27/18 0432  Weight: 86.3 kg 86 kg 86.6 kg    General Appearance:  Looks criticall ill OBESE -+ Head:  Normocephalic, without obvious abnormality, atraumatic Eyes:  PERRL - yes, conjunctiva/corneas - clear     Ears:  Normal external ear canals, both ears Nose:  G tube - no Throat:  ETT TUBE - no , OG tube - no. TRACH + Neck:  Supple,  No  enlargement/tenderness/nodules Lungs: Clear to auscultation bilaterally, Ventilator   Synchrony - no Heart:  S1 and S2 normal, no murmur, CVP - no.  Pressors - no Abdomen:  Soft, no masses, no organomegaly Genitalia / Rectal:  Not done Extremities:  Extremities- intact Skin:  ntact in exposed areas . Sacral area - not examined Neurologic:  Sedation - fent gtt + dilaudid gtt -> RASS - +2 to -2 or sometimes evern +3 . Moves all 4s - yes. CAM-ICU - positive . Orientation - not      LABS    PULMONARY Recent Labs  Lab 03/23/18 0347  PHART 7.361  PCO2ART 55.0*  PO2ART 53.0*  HCO3 31.0*  TCO2 33*  O2SAT 83.0    CBC Recent Labs  Lab 03/25/18 0511 03/26/18 0222 03/27/18 0432  HGB 11.8* 12.2* 12.7*  HCT 39.4 40.3 42.2  WBC 8.5 9.6 11.2*  PLT 266 277 318    COAGULATION Recent Labs  Lab 03/21/18 0407 03/22/18 0358 03/23/18 0229 03/24/18 0558  INR 1.10 1.14 1.11 1.13    CARDIAC  No results for input(s): TROPONINI in the last 168 hours. No results for input(s): PROBNP in the last 168 hours.   CHEMISTRY Recent Labs  Lab 03/22/18 0358 03/23/18 0229 03/25/18 0511 03/26/18 0222 03/27/18 0432  NA 141 140 140 140 139  K 4.2 3.9 3.9 3.9 4.3  CL 101 102 99 95* 96*  CO2 29 31 33* 33* 33*  GLUCOSE 376* 168* 135* 110* 111*  BUN 18 21* 21* 22* 18  CREATININE 0.53* 0.55* 0.43* 0.56* 0.56*  CALCIUM 8.5* 8.3* 8.5* 9.1 9.2  MG  --   --   --  1.8 1.9  PHOS  --   --   --  3.6 3.6   Estimated Creatinine Clearance: 103.2 mL/min (A) (by C-G formula based on SCr of 0.56 mg/dL (L)).   LIVER Recent Labs  Lab 03/21/18 0407 03/22/18 0358 03/23/18 0229 03/24/18 0558  AST  --  29 40  --   ALT  --  38 49*  --   ALKPHOS  --  34* 50  --   BILITOT  --  0.3 0.3  --   PROT  --  5.3* 5.5*  --   ALBUMIN  --  1.9* 2.0*  --   INR 1.10 1.14 1.11 1.13     INFECTIOUS No results for input(s): LATICACIDVEN, PROCALCITON in the last 168 hours.   ENDOCRINE CBG (last 3)  Recent  Labs    03/27/18 0005 03/27/18 0328 03/27/18 0813  GLUCAP 106* 105* 108*   IMAGING x48h  - image(s) personally visualized  -   highlighted in bold Dg Chest Port 1 View  Result Date: 03/27/2018 CLINICAL DATA:  Respiratory failure EXAM: PORTABLE CHEST 1 VIEW COMPARISON:  03/23/2018 FINDINGS: Support devices are stable. Cardiomegaly with vascular congestion and bilateral lower lobe opacities. Layering effusions. No real change since  prior study. IMPRESSION: Continued bilateral lower lobe atelectasis or infiltrates. Layering effusions. Electronically Signed   By: Charlett Nose M.D.   On: 03/27/2018 07:21     Assessment & Plan:   Acute on chronic Hypercarbic / Hypoxic Respiratory Failure  -in setting of rhinovirus+, proteus/H.influenza PNA, AECOPD  -s/p trach 12/12  -negative bubble study (small, see above)   03/27/2018 - > does not meet criteria for Trach wean  due to agitation and 70% fio2   P: PRVC, PSV if tolerated  Trach care per protocol  Chest PT  Likely will need LTAC Will need to remove trach sutures around 12/21  Duoneb QID  Nicotine patch   Pulmonary Nodules P: Will need outpatient imaging in 3-6 months   Acute on Chronic Systolic Heart Failure-EF 35%  - neg even balance/ remains net +10L as of 03/26/18  P: Restart scheduled lasix ekg 03/28/18 for qTC   Acute Encephalopathy / Agitated Delirium   03/27/2018 -ongoing severe despite fent gtt, scheduled dilaudid q6h injection, risperdal, klonopin   P: Rotate fent gtt to dilaudid gtt Dilaudid prn Add precedex Continue risperdal Continue klonopin Check qtc 03/28/18   R Femoral DVT with suspected pulmonary embolism. P: Continue LMWH  Dysphagia.   P: TF per protocol  Will need PEG   Urinary retention P: Continue foley cath   Hyperglycemia P: Monitor - normal off SSI    Best practice   Nutrition: TF DVT: therapeutic LMWH Glucose: n/a Sedation: PAD PUD: Pepcid  Family - none at bedside  12/17 and 03/27/18 Dispo  LTAC referral placed    ATTESTATION & SIGNATURE   The patient Brian [name] is critically ill with multiple organ systems failure and requires high complexity decision making for assessment and support, frequent evaluation and titration of therapies, application of advanced monitoring technologies and extensive interpretation of multiple databases.   Critical Care Time devoted to patient care services described in this note is  30  Minutes. This time reflects time of care of this signee Dr Kalman Shan. This critical care time does not reflect procedure time, or teaching time or supervisory time of PA/NP/Med student/Med Resident etc but could involve care discussion time     Dr. Kalman Shan, M.D., [facility].C.P Pulmonary and Critical Care Medicine Staff Physician [facility] Pager: [phone], If no answer or between  15:00h - 7:00h: call [phone]  03/27/2018 12:31 PM

## 2018-03-27 NOTE — Progress Notes (Signed)
PT Cancellation Note  Patient Details Name: Brian [name] MRN: [id number] DOB: 09-May-1955   Cancelled Treatment:    Reason Eval/Treat Not Completed: Other (comment). Pt sedated and unable to arouse.   Laurina Bustle, PT, DPT Acute Rehabilitation Services Pager [phone] Office [phone]    Vanetta Mulders 03/27/2018, 3:43 PM

## 2018-03-27 NOTE — Progress Notes (Signed)
CPT held at this time due to patient resting comfortably. Patient has been agitated and restless earlier in this shift.

## 2018-03-27 NOTE — Progress Notes (Addendum)
Remaining fentanyl wasted into the sink in the presence of Glennon Hamilton. Appx 

## 2018-03-27 NOTE — Progress Notes (Signed)
Order received for foley insertion. RN able to get paid to void 400 cc in urinal. Spoke with MD who stated okay to hold off on foley for now and monitor for retention.

## 2018-03-27 NOTE — Progress Notes (Signed)
ANTICOAGULATION CONSULT NOTE   Pharmacy Consult for Lovenox Indication: DVT  No Known Allergies  Patient Measurements: Height: 5\' 7"  (170.2 cm) Weight: 190 lb 14.7 oz (86.6 kg) IBW/kg (Calculated) : 66.1 Heparin Dosing Weight: 85kg  Vital Signs: Temp: 99.1 F (37.3 C) (12/18 0801) Temp Source: Oral (12/18 0801) BP: 147/86 (12/18 0700) Pulse Rate: 118 (12/18 0801)  Labs: Recent Labs    03/25/18 0511 03/26/18 0222 03/27/18 0432  HGB 11.8* 12.2* 12.7*  HCT 39.4 40.3 42.2  PLT 266 277 318  CREATININE 0.43* 0.56* 0.56*    Estimated Creatinine Clearance: 103.2 mL/min (A) (by C-G formula based on SCr of 0.56 mg/dL (L)).  Assessment: 62yom transfered from [facility] intubated and sedated for respiratory distress and hypotension.  New + DVT started on heparin, then switched to full-dose Lovenox.   -hg= 12.7, SCr= 0.5, wt 86kg (fluid positive ~ 13L)   Goal of Therapy:  Monitor platelets by anticoagulation protocol: Yes   Plan:  continue enoxaparin 80mg  BID Will follow patient progress  Harland German, PharmD Clinical Pharmacist **Pharmacist phone directory can now be found on [email or web address] ([email or web address]).  Listed under [facility].

## 2018-03-28 LAB — CBC WITH DIFFERENTIAL/PLATELET
ABS IMMATURE GRANULOCYTES: 0.03 10*3/uL (ref 0.00–0.07)
BASOS PCT: 0 %
Basophils Absolute: 0 10*3/uL (ref 0.0–0.1)
Eosinophils Absolute: 0.4 10*3/uL (ref 0.0–0.5)
Eosinophils Relative: 4 %
HCT: 38.2 % — ABNORMAL LOW (ref 39.0–52.0)
Hemoglobin: 12 g/dL — ABNORMAL LOW (ref 13.0–17.0)
IMMATURE GRANULOCYTES: 0 %
Lymphocytes Relative: 14 %
Lymphs Abs: 1.3 10*3/uL (ref 0.7–4.0)
MCH: 31.6 pg (ref 26.0–34.0)
MCHC: 31.4 g/dL (ref 30.0–36.0)
MCV: 100.5 fL — ABNORMAL HIGH (ref 80.0–100.0)
Monocytes Absolute: 0.9 10*3/uL (ref 0.1–1.0)
Monocytes Relative: 10 %
NEUTROS PCT: 72 %
Neutro Abs: 6.4 10*3/uL (ref 1.7–7.7)
PLATELETS: 355 10*3/uL (ref 150–400)
RBC: 3.8 MIL/uL — ABNORMAL LOW (ref 4.22–5.81)
RDW: 12.9 % (ref 11.5–15.5)
WBC: 9 10*3/uL (ref 4.0–10.5)
nRBC: 0 % (ref 0.0–0.2)

## 2018-03-28 LAB — BASIC METABOLIC PANEL
Anion gap: 11 (ref 5–15)
BUN: 15 mg/dL (ref 6–20)
CO2: 33 mmol/L — ABNORMAL HIGH (ref 22–32)
Calcium: 8.7 mg/dL — ABNORMAL LOW (ref 8.9–10.3)
Chloride: 97 mmol/L — ABNORMAL LOW (ref 98–111)
Creatinine, Ser: 0.42 mg/dL — ABNORMAL LOW (ref 0.61–1.24)
GFR calc Af Amer: >60 mL/min (ref >60)
GFR calc non Af Amer: >60 mL/min (ref >60)
Glucose, Bld: 157 mg/dL — ABNORMAL HIGH (ref 70–99)
Potassium: 3.7 mmol/L (ref 3.5–5.1)
Sodium: 141 mmol/L (ref 135–145)

## 2018-03-28 LAB — GLUCOSE, CAPILLARY
GLUCOSE-CAPILLARY: 129 mg/dL — AB (ref 70–99)
Glucose-Capillary: 114 mg/dL — ABNORMAL HIGH (ref 70–99)
Glucose-Capillary: 116 mg/dL — ABNORMAL HIGH (ref 70–99)
Glucose-Capillary: 120 mg/dL — ABNORMAL HIGH (ref 70–99)
Glucose-Capillary: 126 mg/dL — ABNORMAL HIGH (ref 70–99)
Glucose-Capillary: 130 mg/dL — ABNORMAL HIGH (ref 70–99)

## 2018-03-28 LAB — MAGNESIUM: Magnesium: 1.9 mg/dL (ref 1.7–2.4)

## 2018-03-28 LAB — PHOSPHORUS: Phosphorus: 4.2 mg/dL (ref 2.5–4.6)

## 2018-03-28 MED ORDER — MAGNESIUM SULFATE 2 GM/50ML IV SOLN
2.0000 g | Freq: Once | INTRAVENOUS | Status: AC
  Administered 2018-03-28: 2 g via INTRAVENOUS
  Filled 2018-03-28: qty 50

## 2018-03-28 MED ORDER — POTASSIUM CHLORIDE 10 MEQ/50ML IV SOLN
10.0000 meq | INTRAVENOUS | Status: AC
  Administered 2018-03-28 (×2): 10 meq via INTRAVENOUS
  Filled 2018-03-28 (×2): qty 50

## 2018-03-28 MED ORDER — MIDAZOLAM HCL 2 MG/2ML IJ SOLN
INTRAMUSCULAR | Status: AC
  Administered 2018-03-28: 2 mg
  Filled 2018-03-28: qty 4

## 2018-03-28 MED ORDER — POTASSIUM CHLORIDE 20 MEQ/15ML (10%) PO SOLN
40.0000 meq | Freq: Once | ORAL | Status: AC
  Administered 2018-03-28: 40 meq
  Filled 2018-03-28: qty 30

## 2018-03-28 NOTE — Clinical Social Work Note (Signed)
PT on vent at 50% Fi02. Pt can not go to vent SNF unless Fi02 is 40% or less. CSW continuing to follow for medically stability.  [place], [place] [phone]

## 2018-03-28 NOTE — Progress Notes (Signed)
Patient has been incontinent of urine. He gets extremely agitated when he has to urinate and is unable to get OOB. An external catheter was attempted overnight by night shift RN and this morning, but patient was able to remove it repeatedly, despite being in bilateral wrist restraints. While changing pt's linens after incontinence episodes, his O2 sats drop to 82-84% and it takes 30-60 minutes for him to recover and get his O2 sats to an appropriate level. RT aware. Dr. Marchelle Gearing notified and stated it was ok to place a foley catheter for now to prevent patient decline.  Leanna Battles, RN

## 2018-03-28 NOTE — Progress Notes (Signed)
NAME:  Brian [name], MRN:  [id number], DOB:  [birth year], LOS: 20 ADMISSION DATE:  03-26-2018, CONSULTATION DATE:  03/26/18 REFERRING MD:  Lamont Snowball - ED, CHIEF COMPLAINT:  Acute respiratory failure.   Brief Summary:  62 year old man who was intubated and transferred from [facility] for hypercapneic respiratory failure in the setting of exacerbation of COPD, Rhinovirus, proteus mirabilis / H. Influenzae pneumonia. New DVT on anticoagulation. Extubated but required reintubation for hypoxia.  Completed appropriate antibiotics.  Course complicated by ETOH withdrawal and agitated delirium.  No evidence of shunt on TEE.  S/P trach, cortrak.   Social hx elicited 03/28/18 - per RN - homeless, drug abuser, alcoholic  Past Medical History  ETOH Abuse  Cardiomyopathy  Cocaine abuse  COPD (might be severe based on huge barrel chest) Homelessness  Medical non-compliance  Paroxysmal VT Suicide attempt  Tobacco Abuse  DVT  Results for KINSLER, SOEDER (MRN [id number]) as of 03/25/2018 13:44  Ref. Range 01/06/2018 16:12 02/24/2018 07:41  COCAINE Latest Ref Range: NONE DETECTED  POSITIVE (A) POSITIVE (A)  Tetrahydrocannabinol Latest Ref Range: NONE DETECTED  POSITIVE (A) POSITIVE (A)   Significant Events:  11/29  Admit with hypercarbic resp fx in setting of PNA + AECOPD.  DVT+ 12/05 Precedex   12/07 HFNC 15L, interactive on precedex 12/08 Re-intubated for hypoxia (? atx vs PNA) 12/09 High peep/fio2. Slowly weaning 12/10 CT chest and bubble study as FIO2 and PEEP requirements seem out of proportion to CXR findings.  12/11 Right to left shunt (small via TTE). CT chest w/ bibasilar PNA; TEE ordered. Still not weaning much. TEE was neg for sig shunt. ABX stopped.  12/12 Trach placed. CXR stable. Still requiring high FIO2 and PEEP. Trach aspirate with proteus and H flu 12/13 Cortrack.  12/15 - Tmax 100.2.  I/O- 1L positive in last 24 hours, 10L positive for admit.  RN reports ongoing agitated delirium.   No acute events overnight.  12/16 - fent gtt titrated off. 60% fio3/peep 5+. Follows commands. Afebrile 12/17 - Tmax 101.6 yest, normal today. No events overnight. Currently weaned to 50% and 8 peep  Fentanyl gtt remains at 200 ml/hr with patient intermittently trying to get out of bed  12/18 -On fent gtt, risperdan scheduled, nicotine patch, ativan prn and dialudid prn -> still Ongoing agitation itermittently. CPT held. On 70% fio2 and peep 8   Procedures  ETT 12/8 >>12/12  RUE PICC 12/9 >> Trach 12/12 >>   Diagnostic tests:  DVT 11/29 >> acute DVT involving the right common femoral vein, right femoral vein, right proximal profunda CT chest 12/10 >> R>L PNA/consolidation left superior seg pulm nodules.  ECHO w/ bubble study 12/10 >> small right to left shunt.  Ejection fraction 40 to 45%, mild diffuse hypokinesis  Cultures:  Tracheal aspirate 12/2 >> proteus mirabilis >> pan sensitive                Haemophilus influenzae  Tracheal aspirate 12/8 >> neg BCx2 12/8 >> negative  RVP 11/29 >> rhinovirus   Antibiotics  Rocephin 12/2 >> 12/11  Interim history/subjective:   12/19 - 60% fio2, peep 8. Now on dilaudid gtt and precedex gtt with ativan prn, risperdal scheduled and nicotine patch. Did SBT for 1h and then agitated. Aigtation some better controlled with dilaudid but still intermittently very agitated. Also desaturates easy. RN requesting foley back. Being diuresed Social: no family has visited. RN reports homeless state   Objective   Blood pressure 90/63, pulse 62,  temperature 98.5 F (36.9 C), temperature source Axillary, resp. rate 20, height 5\' 7"  (1.702 m), weight 84.6 kg, SpO2 92 %.    Vent Mode: PRVC FiO2 (%):  [40 %-100 %] 100 % Set Rate:  [18 bmp] 18 bmp Vt Set:  [510 mL] 510 mL PEEP:  [8 cmH20] 8 cmH20 Pressure Support:  [12 cmH20] 12 cmH20 Plateau Pressure:  [13 cmH20-15 cmH20] 13 cmH20   Intake/Output Summary (Last 24 hours) at 03/28/2018 1104 Last data  filed at 03/28/2018 0800 Gross per 24 hour  Intake 661.45 ml  Output 1300 ml  Net -638.55 ml   Filed Weights   03/26/18 0500 03/27/18 0432 03/28/18 0600  Weight: 86 kg 86.6 kg 84.6 kg   General Appearance:  Looks criticall ill OBESE - + Head:  Normocephalic, without obvious abnormality, atraumatic Eyes:  PERRL - yes, conjunctiva/corneas - clear     Ears:  Normal external ear canals, both ears Nose:  intact Throat:  ETT TUBE - no , OG tube - no. TRACH +. BEARD + Neck:  Supple,  No enlargement/tenderness/nodules Lungs: Clear to auscultation bilaterally, Ventilator   Synchrony - yes when not agitated. HUGE BARREL CHESt Heart:  S1 and S2 normal, no murmur, CVP - no.  Pressors - no Abdomen:  Soft, no masses, no organomegaly Genitalia / Rectal:  Not done Extremities:  Extremities- intact Skin:  ntact in exposed areas . Sacral area - not examined Neurologic:  Sedation - dilaudid gtt and precedex gtt -> RASS - -3 currently . Moves all 4s - yes esp when agitated. CAM-ICU - +ve delirum when agitatd . Orientation - can occ follow commands        LABS    PULMONARY Recent Labs  Lab 03/23/18 0347  PHART 7.361  PCO2ART 55.0*  PO2ART 53.0*  HCO3 31.0*  TCO2 33*  O2SAT 83.0    CBC Recent Labs  Lab 03/26/18 0222 03/27/18 0432 03/28/18 0417  HGB 12.2* 12.7* 12.0*  HCT 40.3 42.2 38.2*  WBC 9.6 11.2* 9.0  PLT 277 318 355    COAGULATION Recent Labs  Lab 03/22/18 0358 03/23/18 0229 03/24/18 0558  INR 1.14 1.11 1.13    CARDIAC  No results for input(s): TROPONINI in the last 168 hours. No results for input(s): PROBNP in the last 168 hours.   CHEMISTRY Recent Labs  Lab 03/23/18 0229 03/25/18 0511 03/26/18 0222 03/27/18 0432 03/28/18 0020 03/28/18 0417  NA 140 140 140 139 141  --   K 3.9 3.9 3.9 4.3 3.7  --   CL 102 99 95* 96* 97*  --   CO2 31 33* 33* 33* 33*  --   GLUCOSE 168* 135* 110* 111* 157*  --   BUN 21* 21* 22* 18 15  --   CREATININE 0.55* 0.43*  0.56* 0.56* 0.42*  --   CALCIUM 8.3* 8.5* 9.1 9.2 8.7*  --   MG  --   --  1.8 1.9  --  1.9  PHOS  --   --  3.6 3.6  --  4.2   Estimated Creatinine Clearance: 102.1 mL/min (A) (by C-G formula based on SCr of 0.42 mg/dL (L)).   LIVER Recent Labs  Lab 03/22/18 0358 03/23/18 0229 03/24/18 0558  AST 29 40  --   ALT 38 49*  --   ALKPHOS 34* 50  --   BILITOT 0.3 0.3  --   PROT 5.3* 5.5*  --   ALBUMIN 1.9* 2.0*  --   INR  1.14 1.11 1.13     INFECTIOUS No results for input(s): LATICACIDVEN, PROCALCITON in the last 168 hours.   ENDOCRINE CBG (last 3)  Recent Labs    03/28/18 0007 03/28/18 0337 03/28/18 0748  GLUCAP 116* 129* 130*   IMAGING x48h  - image(s) personally visualized  -   highlighted in bold Dg Chest Port 1 View  Result Date: 03/27/2018 CLINICAL DATA:  Respiratory failure EXAM: PORTABLE CHEST 1 VIEW COMPARISON:  03/23/2018 FINDINGS: Support devices are stable. Cardiomegaly with vascular congestion and bilateral lower lobe opacities. Layering effusions. No real change since prior study. IMPRESSION: Continued bilateral lower lobe atelectasis or infiltrates. Layering effusions. Electronically Signed   By: Charlett NoseKevin  Dover M.D.   On: 03/27/2018 07:21     Assessment & Plan: 20  Days LOS   RESP Baseline: COPD NOS (but big barrel chest so likely severe) Current: Acute on chronic Hypercarbic / Hypoxic Respiratory Failure s/p trach 03/21/18 -in setting of rhinovirus+, proteus/H.influenza PNA, AECOPD  -negative bubble study (small, see above)   03/28/2018 - > continued failure to wean due to fio2 60% and peep 8 and agitated delirum  P: PRVC, PSV if tolerated  Trach care per protocol  Chest PT  LTAC but not insurance eligible Will need to remove trach sutures around 12/21  Duoneb QID  Nicotine patch   Pulmonary Nodules P: Will need outpatient imaging in 3-6 months   Acute on Chronic Systolic Heart Failure-EF 35%  -- QTC 443 msec on 03/28/18 - +12.4L positive  since admit 03/01/2018 on 03/26/18  - improved to +11.98L positive on 03/28/2018  P: Scheduled lasix    Acute Encephalopathy / Agitated Delirium   03/28/2018 - improved but ongoing    P: o dilaudid gtt Dilaudid prn Precedex Continue risperdal Continue klonopin   R Femoral DVT with suspected pulmonary embolism. P: Continue LMWH  Dysphagia.   P: TF per protocol  Will need PEG   Urinary retention 12/19 - did not tolerate being without foley P: Reinsert foley   Hyperglycemia P: Monitor - normal off SSI    Best practice   Nutrition: TF DVT: therapeutic LMWH Glucose: n/a Sedation: PAD PUD: Pepcid  Family - none at bedside 12/17 and 03/27/18 and 03/28/18 Dispo  LTAC ineliglble Goal: Call [employer] care consult (homeless, poor prognosis, poor baseline health and chronic criticall illness with > 50% 1 year mortality)     ATTESTATION & SIGNATURE   The patient Elsie SaasRichard B Bas is critically ill with multiple organ systems failure and requires high complexity decision making for assessment and support, frequent evaluation and titration of therapies, application of advanced monitoring technologies and extensive interpretation of multiple databases.   Critical Care Time devoted to patient care services described in this note is  30  Minutes. This time reflects time of care of this signee Dr Kalman ShanMurali Esaiah Wanless. This critical care time does not reflect procedure time, or teaching time or supervisory time of PA/NP/Med student/Med Resident etc but could involve care discussion time     Dr. Kalman ShanMurali Kathrynn Backstrom, M.D., [facility] Pulmonary and Critical Care Medicine Staff Physician [facility] [place] Pulmonary and Critical Care Pager: [phone], If no answer or between  15:00h - 7:00h: call [phone]  03/28/2018 11:04 AM

## 2018-03-28 NOTE — Progress Notes (Addendum)
PCCM INTERVAL PROGRESS NOTE  Called to bedside for patient with tracheostomy cuff leak. He is day 7 with trach, sutures still in place. He was not noted to have this cuff leak earlier today. More air has been added to cuff and despite high cuff pressure, he continues to have significant air leak and is able to phonate. Unfortunately he has been unable to participate in any meaningful weaning efforts and will need to continue with cuffed trach. CXR reviewed. Externally it does not appear as though his trach has become dislodged in any way. Seems like he may benefit from XLT.   Plan: Change trach to #6 XLT Continue full vent support.    Joneen Roach, AGACNP-BC [facility] Pulmonary/Critical Care Pager [phone] or [phone]  03/28/2018 4:53 PM

## 2018-03-28 NOTE — Progress Notes (Signed)
Trach changed to 6.0 XLT at this time. Sutures removed prior to change. Tolerated well. Placed on 100% prior to. No complications. Placed back on vent and re-secured per policy. NP at bedside for procedure.

## 2018-03-28 NOTE — Evaluation (Signed)
Physical Therapy Evaluation Patient Details Name: Brian SaasRichard B [name] MRN: [id number] DOB: 05/24/1955 Today's Date: 03/28/2018   History of Present Illness  62 year old man who [facility] hyper[facility]ne[facility] respiratory failure. Extubated 12/3. Developed encephalopathy 12/6 and reintubated due to respiratory failure due to PNA bil on 12/8.  Trach performed 12/12.  right femoral DVT as well.  Past medical history of Alcohol abuse, Arthritis, Cardiomyopathy (HCC), CHF (congestive heart failure) (HCC) with EF 25%.Paroxysmal VT (HCC), Suicide attempt (HCC), and Tobacco abuse.  Clinical Impression  Pt again evaluated but eval very limited due to pt's respiratory status and his cognitive status. Attempted bed level ex's but pt not following any commands pt constantly moving legs off of bed and attempting to resist other movements. Pt attempting to sit up in bed. Will follow pt for increased medical stability and improved cognition that will allow him to participate in PT. Will follow up late next week for improvements in these.     Follow Up Recommendations SNF;Supervision/Assistance - 24 hour    Equipment Recommendations  Other (comment)(TBD)    Recommendations for Other Services       Precautions / Restrictions Precautions Precautions: Fall;Other (comment) Precaution Comments: vent, trach, watch O2 Sats Restrictions Weight Bearing Restrictions: No      Mobility  Bed Mobility                  Transfers                    Ambulation/Gait                Stairs            Wheelchair Mobility    Modified Rankin (Stroke Patients Only)       Balance                                             Pertinent Vitals/Pain Pain Assessment: Faces Faces Pain Scale: No hurt    Home Living Family/patient expects to be discharged to:: Skilled nursing facility                      Prior Function Level of  Independence: Independent               Hand Dominance   Dominant Hand: Right    Extremity/Trunk Assessment   Upper Extremity Assessment Upper Extremity Assessment: Difficult to assess due to impaired cognition    Lower Extremity Assessment Lower Extremity Assessment: Difficult to assess due to impaired cognition       Communication   Communication: Tracheostomy  Cognition Arousal/Alertness: Awake/alert Behavior During Therapy: Restless;Agitated;Impulsive Overall Cognitive Status: Difficult to assess                                 General Comments: Pt not following commands      General Comments      Exercises     Assessment/Plan    PT Assessment Patient needs continued PT services  PT Problem List Decreased mobility;Decreased cognition;Decreased activity tolerance       PT Treatment Interventions DME instruction;Functional mobility training;Therapeutic activities;Therapeutic exercise;Balance training;Cognitive remediation;Patient/family education    PT Goals (Current goals can be found in the Care Plan section)  Acute Rehab  PT Goals Patient Stated Goal: unable to state PT Goal Formulation: Patient unable to participate in goal setting Time For Goal Achievement: 04/11/18 Potential to Achieve Goals: Fair    Frequency Min 1X/week   Barriers to discharge        Co-evaluation               AM-PAC PT "6 Clicks" Mobility  Outcome Measure Help needed turning from your back to your side while in a flat bed without using bedrails?: Total Help needed moving from lying on your back to sitting on the side of a flat bed without using bedrails?: Total Help needed moving to and from a bed to a chair (including a wheelchair)?: Total Help needed standing up from a chair using your arms (e.g., wheelchair or bedside chair)?: Total Help needed to walk in hospital room?: Total Help needed climbing 3-5 steps with a railing? : Total 6 Click Score:  6    End of Session   Activity Tolerance: Treatment limited secondary to agitation Patient left: in bed;with restraints reapplied   PT Visit Diagnosis: Other abnormalities of gait and mobility (R26.89)    Time: 7078-6754 PT Time Calculation (min) (ACUTE ONLY): 9 min   Charges:   PT Evaluation $PT Eval Moderate Complexity: 1 Mod          [facility] PT Acute Rehabilitation Services Pager [phone] Office [phone]   Angelina Ok [facility] 03/28/2018, 4:18 PM

## 2018-03-28 NOTE — Progress Notes (Signed)
  Speech Language Pathology  Patient Details Name: Brian [name] MRN: [id number] DOB: 1956/03/29 Today's Date: 03/28/2018 Time:  -     Inline PMV not initiated due to decreased O2 sats on CPAP and when RN switched to [facility]. Will follow for appropriateness  Royce Macadamia M.Ed Sports administrator Pager [phone] Office [phone]                      GO                Royce Macadamia 03/28/2018, 10:22 AM

## 2018-03-29 ENCOUNTER — Inpatient Hospital Stay (HOSPITAL_COMMUNITY): Payer: Medicaid Other

## 2018-03-29 DIAGNOSIS — Z515 Encounter for palliative care: Secondary | ICD-10-CM

## 2018-03-29 DIAGNOSIS — R451 Restlessness and agitation: Secondary | ICD-10-CM

## 2018-03-29 LAB — CBC WITH DIFFERENTIAL/PLATELET
Abs Immature Granulocytes: 0.04 10*3/uL (ref 0.00–0.07)
Basophils Absolute: 0 10*3/uL (ref 0.0–0.1)
Basophils Relative: 1 %
Eosinophils Absolute: 0.3 10*3/uL (ref 0.0–0.5)
Eosinophils Relative: 4 %
HCT: 40.6 % (ref 39.0–52.0)
HEMOGLOBIN: 12.1 g/dL — AB (ref 13.0–17.0)
Immature Granulocytes: 1 %
LYMPHS PCT: 23 %
Lymphs Abs: 1.9 10*3/uL (ref 0.7–4.0)
MCH: 29.9 pg (ref 26.0–34.0)
MCHC: 29.8 g/dL — ABNORMAL LOW (ref 30.0–36.0)
MCV: 100.2 fL — ABNORMAL HIGH (ref 80.0–100.0)
Monocytes Absolute: 0.9 10*3/uL (ref 0.1–1.0)
Monocytes Relative: 10 %
NEUTROS ABS: 5.3 10*3/uL (ref 1.7–7.7)
Neutrophils Relative %: 61 %
Platelets: 350 10*3/uL (ref 150–400)
RBC: 4.05 MIL/uL — ABNORMAL LOW (ref 4.22–5.81)
RDW: 12.9 % (ref 11.5–15.5)
WBC: 8.5 10*3/uL (ref 4.0–10.5)
nRBC: 0 % (ref 0.0–0.2)

## 2018-03-29 LAB — BASIC METABOLIC PANEL
ANION GAP: 13 (ref 5–15)
BUN: 23 mg/dL — ABNORMAL HIGH (ref 6–20)
CO2: 34 mmol/L — ABNORMAL HIGH (ref 22–32)
Calcium: 9.1 mg/dL (ref 8.9–10.3)
Chloride: 94 mmol/L — ABNORMAL LOW (ref 98–111)
Creatinine, Ser: 0.58 mg/dL — ABNORMAL LOW (ref 0.61–1.24)
GFR calc Af Amer: >60 mL/min (ref >60)
GFR calc non Af Amer: >60 mL/min (ref >60)
Glucose, Bld: 156 mg/dL — ABNORMAL HIGH (ref 70–99)
POTASSIUM: 4.1 mmol/L (ref 3.5–5.1)
Sodium: 141 mmol/L (ref 135–145)

## 2018-03-29 LAB — GLUCOSE, CAPILLARY
Glucose-Capillary: 123 mg/dL — ABNORMAL HIGH (ref 70–99)
Glucose-Capillary: 135 mg/dL — ABNORMAL HIGH (ref 70–99)
Glucose-Capillary: 128 mg/dL — ABNORMAL HIGH (ref 70–99)
Glucose-Capillary: 120 mg/dL — ABNORMAL HIGH (ref 70–99)
Glucose-Capillary: 123 mg/dL — ABNORMAL HIGH (ref 70–99)

## 2018-03-29 LAB — PHOSPHORUS: Phosphorus: 4.8 mg/dL — ABNORMAL HIGH (ref 2.5–4.6)

## 2018-03-29 LAB — MAGNESIUM: MAGNESIUM: 1.9 mg/dL (ref 1.7–2.4)

## 2018-03-29 MED ORDER — MIDAZOLAM HCL 2 MG/2ML IJ SOLN
2.0000 mg | Freq: Once | INTRAMUSCULAR | Status: AC
  Administered 2018-03-29: 2 mg via INTRAVENOUS
  Filled 2018-03-29: qty 2

## 2018-03-29 MED ORDER — MAGNESIUM SULFATE 2 GM/50ML IV SOLN
2.0000 g | Freq: Once | INTRAVENOUS | Status: AC
  Administered 2018-03-29: 2 g via INTRAVENOUS
  Filled 2018-03-29: qty 50

## 2018-03-29 NOTE — Consult Note (Addendum)
Consultation Note Date: 03/29/2018   Patient Name: Brian [name]  DOB: 1955-11-26  MRN: [id number]  Age / Sex: 62 y.o., male  PCP: Clent Demark, PA-C Referring Physician: Brand Males, MD  Reason for Consultation: Establishing goals of care and Psychosocial/spiritual support  HPI/Patient Profile: 62 y.o. male  with past medical history of osteoarthritis, COPD, ischemic cardiomyopathy with an ejection fraction of 25% (ejection fraction in July was 30 to 35%), history of V. tach, history of homelessness, polysubstance use disorder admitted on 02/11/2018 with hypercapnic respiratory failure.  He was intubated and transferred from [facility] to [facility] for more intensive care.  He was extubated on 03/12/2018 but required reintubation on 03/15/2018.  Tracheostomy was performed on 03/21/2018.  At this point, he is unable to use a [employer] valve.  His hospitalization has been complicated by alcohol withdrawal, agitation.  He remains on Precedex secondary to agitation as well as restraints.  Unfortunately his oxygen requirements which are now at 70% per trach collar are prohibiting him from disposition to skilled nursing or LTAC.  Patient has been seen by palliative medicine team in July 2019 and at that time he had verbalized that he would want full scope of treatment were his condition to decline.  He is now a full code.  Consult ordered for goals of care and psychosocial support.  When I met with Brian [name] in July 2019, he shared he had a daughter but did not give her phone number to me.  His brother, Brian [name], is listed under demographics as emergency contact.   Clinical Assessment and Goals of Care: Unfortunately patient cannot speak for himself secondary to high oxygen demands, trach collar, no Passy-Muir valve.  When you  speak to him he becomes agitated and does not appear  to have the ability to write answers at this point.  I did attempt to call his brother, Brian [name], at [phone] but received no answer or means to leave a message.  Nursing reports that she has not seen anyone into visit  It is not clear who his spokesperson will be since he is unable to speak for himself.  As noted above, I had met with Brian [name] in the past and he indicated he had a daughter but he did not give me her phone number to place in the chart.  I did attempt to reach out to his brother but was unable to reach him.  Per discussions in the past, Brian [name] of indicated he would want every everything done, full scope of interventions    SUMMARY OF RECOMMENDATIONS   Patient is a full code Continue full scope of treatment Will reach out to social work to see if we can find family member either his brother or his daughter At this point, until his O2 requirements are at 40%, we are in the process of trying for skilled nursing or LTAC placement when patient is medically ready Recommend continued goals of care conversations either inpatient stay or in  outpatient setting in skilled nursing facility.  Those resources can be accessed through either [facility] at [phone] or [facility] palliative medicine division at [phone] Code Status/Advance Care Planning:  Full code    Symptom Management:   Pain shortness of breath: Continue with Dilaudid infusion as directed by CCM  Agitation: Continue with Precedex as directed by CCM with a goal for patient to be restraint free as soon as possible Palliative Prophylaxis:   Aspiration, Bowel Regimen, Delirium Protocol, Eye Care, Frequent Pain Assessment, Oral Care and Turn Reposition  Additional Recommendations (Limitations, Scope, Preferences):  Full Scope Treatment  Psycho-social/Spiritual:   Desire for further Chaplaincy support:no  Additional Recommendations: Referral to Community  Resources   Prognosis:   Unable to determine  Discharge Planning: [place] for rehab with Palliative care service follow-up      Primary Diagnoses: Present on Admission: . Respiratory failure ([place])   I have reviewed the medical record, interviewed the patient and family, and examined the patient. The following aspects are pertinent.  Past Medical History:  Diagnosis Date  . Alcohol abuse    Heavy alcohol abuse and homelessness  . Arthritis   . Cardiomyopathy ([place])   . CHF (congestive heart failure) ([place])   . Cocaine abuse ([place])    And crack   . COPD (chronic obstructive pulmonary disease) ([place])   . Heart failure   . Homelessness   . Noncompliance   . Paroxysmal VT ([place])    a. first seen 2013 in setting of normal LVEF 55-60%, EF now decreased.  . Suicide attempt ([place])   . Tobacco abuse    Social History   Socioeconomic History  . Marital status: Divorced    Spouse name: Not on file  . Number of children: Not on file  . Years of education: Not on file  . Highest education level: Not on file  Occupational History  . Occupation: unemployed  Social Needs  . Financial resource strain: Very hard  . Food insecurity:    Worry: Sometimes true    Inability: Sometimes true  . Transportation needs:    Medical: Yes    Non-medical: Yes  Tobacco Use  . Smoking status: Current Every Day Smoker    Packs/day: 2.00    Types: Cigarettes  . Smokeless tobacco: Never Used  Substance and Sexual Activity  . Alcohol use: Yes    Comment: Heavy. 5-10 40oz daily  . Drug use: No    Types: Marijuana, Cocaine    Comment: Marijuana and cocaine, crack  . Sexual activity: Not Currently    Birth control/protection: None  Lifestyle  . Physical activity:    Days per week: 0 days    Minutes per session: 0 min  . Stress: Only a little  Relationships  . Social connections:    Talks on phone: More than three times a week    Gets together: Once a week    Attends  religious service: Never    Active member of club or organization: Yes    Attends meetings of clubs or organizations: Never    Relationship status: Divorced  Other Topics Concern  . Not on file  Social History Narrative   Currently homeless. Has poor social support, no family, just friends and police who check on him. Heavy alcohol noted in the past, currently states he drinks "1-2 beers a day" and last drink was earlier today. States he may get minor shakes and anxiety but denies frank  EtOH withdrawal, seizures, DT's. States last cocaine and MJ abuse was last week. Denies IVDU    Family History  Problem Relation Age of Onset  . Coronary artery disease Unknown   . Diabetes Unknown   . Cancer Sister    Scheduled Meds: . aspirin  81 mg Per Tube Daily  . atorvastatin  80 mg Per Tube q1800  . chlorhexidine gluconate (MEDLINE KIT)  15 mL Mouth Rinse BID  . Chlorhexidine Gluconate Cloth  6 each Topical Daily  . clonazepam  0.5 mg Per Tube Q6H  . enoxaparin (LOVENOX) injection  80 mg Subcutaneous Q12H  . famotidine  20 mg Per Tube BID  . folic acid  1 mg Per Tube Daily  . furosemide  40 mg Intravenous Q8H  . ipratropium-albuterol  3 mL Nebulization QID  . mouth rinse  15 mL Mouth Rinse 10 times per day  . nicotine  21 mg Transdermal Daily  . risperiDONE  0.5 mg Per Tube BID  . sodium chloride flush  10-40 mL Intracatheter Q12H  . thiamine  100 mg Per Tube Daily   Continuous Infusions: . dexmedetomidine (PRECEDEX) IV infusion 0.4 mcg/kg/hr (03/29/18 1000)  . dextrose 5% lactated ringers 10 mL/hr at 03/29/18 1000  . feeding supplement (VITAL AF 1.2 CAL) 1,000 mL (03/29/18 0021)  . HYDROmorphone 1 mg/hr (03/29/18 1000)  . magnesium sulfate 1 - 4 g bolus IVPB 2 g (03/29/18 1017)   PRN Meds:.acetaminophen, albuterol, bisacodyl, docusate, HYDROmorphone, Influenza vac split quadrivalent PF, LORazepam, ondansetron (ZOFRAN) IV, polyethylene glycol, sodium chloride flush Medications Prior to  Admission:  Prior to Admission medications   Medication Sig Start Date End Date Taking? Authorizing Provider  albuterol (PROVENTIL HFA;VENTOLIN HFA) 108 (90 Base) MCG/ACT inhaler Inhale 2 puffs into the lungs every 6 (six) hours as needed for wheezing or shortness of breath. Patient not taking: Reported on 03/11/2018 02/26/18   Caren Griffins, MD  aspirin EC 81 MG EC tablet Take 1 tablet (81 mg total) by mouth daily. Patient not taking: Reported on 03/11/2018 02/27/18   Caren Griffins, MD  atorvastatin (LIPITOR) 80 MG tablet Take 1 tablet (80 mg total) by mouth daily at 6 PM. 02/26/18   Caren Griffins, MD  carvedilol (COREG) 6.25 MG tablet Take 1 tablet (6.25 mg total) by mouth 2 (two) times daily with a meal. 01/07/18 01/02/19  Eulogio Bear U, DO  diclofenac sodium (VOLTAREN) 1 % GEL Apply 4 g topically 4 (four) times daily. 02/26/18   Caren Griffins, MD  doxycycline (VIBRA-TABS) 100 MG tablet Take 1 tablet (100 mg total) by mouth 2 (two) times daily. 02/26/18   Caren Griffins, MD  folic acid (FOLVITE) 1 MG tablet Take 1 tablet (1 mg total) by mouth daily. 01/07/18 07/06/18  Geradine Girt, DO  furosemide (LASIX) 40 MG tablet Take 1 tablet (40 mg total) by mouth daily. 02/26/18 02/26/19  Caren Griffins, MD  lisinopril (PRINIVIL,ZESTRIL) 5 MG tablet Take 2 tablets (10 mg total) by mouth daily. 02/26/18   Caren Griffins, MD  mometasone-formoterol (DULERA) 200-5 MCG/ACT AERO Inhale 2 puffs into the lungs 2 (two) times daily. 02/26/18   Caren Griffins, MD  nicotine (NICODERM CQ - DOSED IN MG/24 HOURS) 21 mg/24hr patch Place 1 patch (21 mg total) onto the skin daily. Patient not taking: Reported on 01/05/2018 11/08/17   Clent Demark, PA-C  predniSONE (DELTASONE) 20 MG tablet Take 2 tablets (40 mg total) by mouth daily. 02/26/18  Caren Griffins, MD   No Known Allergies Review of Systems  Unable to perform ROS: Intubated    Physical Exam Vitals signs and nursing note  reviewed.  Constitutional:      General: He is in acute distress.     Appearance: He is ill-appearing.  HENT:     Head: Normocephalic and atraumatic.     Nose: Nose normal.     Mouth/Throat:     Comments: trach Cardiovascular:     Rate and Rhythm: Normal rate.  Pulmonary:     Comments: Trach with 70% Skin:    General: Skin is warm and dry.  Neurological:     Mental Status: He is alert. He is disoriented.  Psychiatric:     Comments: Agitated; in restraints,o n precedex     Vital Signs: BP 93/62   Pulse (!) 58   Temp (!) 97.4 F (36.3 C) (Axillary)   Resp 19   Ht '5\' 7"'  (1.702 m)   Wt 82 kg   SpO2 93%   BMI 28.31 kg/m  Pain Scale: CPOT POSS *See Group Information*: 1-Acceptable,Awake and alert Pain Score: Asleep   SpO2: SpO2: 93 % O2 Device:SpO2: 93 % O2 Flow Rate: .O2 Flow Rate (L/min): 15 L/min  IO: Intake/output summary:   Intake/Output Summary (Last 24 hours) at 03/29/2018 1115 Last data filed at 03/29/2018 1000 Gross per 24 hour  Intake 2295.92 ml  Output 2385 ml  Net -89.08 ml    LBM: Last BM Date: 03/22/18 Baseline Weight: Weight: 89.8 kg Most recent weight: Weight: 82 kg     Palliative Assessment/Data:   Flowsheet Rows     Most Recent Value  Intake Tab  Referral Department  Critical care  Unit at Time of Referral  ICU  Palliative Care Primary Diagnosis  Sepsis/Infectious Disease  Date Notified  03/28/18  Palliative Care Type  Return patient Palliative Care  Date of Admission  03/01/2018  Date first seen by Palliative Care  03/29/18  # of days Palliative referral response time  1 Day(s)  # of days IP prior to Palliative referral  20  Clinical Assessment  Palliative Performance Scale Score  30%  Pain Max last 24 hours  Not able to report  Pain Min Last 24 hours  Not able to report  Dyspnea Max Last 24 Hours  Not able to report  Dyspnea Min Last 24 hours  Not able to report  Nausea Max Last 24 Hours  Not able to report  Nausea Min Last 24  Hours  Not able to report  Anxiety Max Last 24 Hours  Not able to report  Anxiety Min Last 24 Hours  Not able to report  Other Max Last 24 Hours  Not able to report  Psychosocial & Spiritual Assessment  Palliative Care Outcomes  Patient/Family meeting held?  No  Palliative Care follow-up planned  Yes, Facility      Time In: 0930 Time Out: 1030 Time Total: 60 Greater than 50%  of this time was spent counseling and coordinating care related to the above assessment and plan.  Signed by: Dory Horn, NP   Please contact Palliative Medicine Team phone at [phone] for questions and concerns.  For individual provider: See Shea Evans

## 2018-03-29 NOTE — Progress Notes (Signed)
ANTICOAGULATION CONSULT NOTE   Pharmacy Consult for Lovenox Indication: DVT  No Known Allergies  Patient Measurements: Height: 5\' 7"  (170.2 cm) Weight: 180 lb 12.4 oz (82 kg) IBW/kg (Calculated) : 66.1 Heparin Dosing Weight: 85kg  Vital Signs: Temp: 97.4 F (36.3 C) (12/20 0735) Temp Source: Axillary (12/20 0735) BP: 82/57 (12/20 0820) Pulse Rate: 74 (12/20 0820)  Labs: Recent Labs    03/27/18 0432 03/28/18 0020 03/28/18 0417 03/29/18 0002 03/29/18 0508  HGB 12.7*  --  12.0*  --  12.1*  HCT 42.2  --  38.2*  --  40.6  PLT 318  --  355  --  350  CREATININE 0.56* 0.42*  --  0.58*  --     Estimated Creatinine Clearance: 100.7 mL/min (A) (by C-G formula based on SCr of 0.58 mg/dL (L)).  Assessment: 62yom transfered from [facility] intubated and sedated for respiratory distress and hypotension.  New + DVT started on heparin, then switched to full-dose Lovenox.   -hg= 12s, SCr= 0.5, wt 86kg   Patient stable without issues. Pharmacy to sign off and follow peripherally while in ICU.   Goal of Therapy:  Monitor platelets by anticoagulation protocol: Yes   Plan:  continue enoxaparin 80mg  BID Will follow patient progress  Sheppard Coil PharmD., BCPS Clinical Pharmacist 03/29/2018 8:50 AM

## 2018-03-29 NOTE — Progress Notes (Signed)
NAME:  Brian [name], MRN:  [id number], DOB:  [birth year], LOS: 21 ADMISSION DATE:  04/02/2018, CONSULTATION DATE:  April 07, 2018 REFERRING MD:  Lamont Snowball - ED, CHIEF COMPLAINT:  Acute respiratory failure.   Brief Summary:  62 year old man who was intubated and transferred from [facility] for hypercapneic respiratory failure in the setting of exacerbation of COPD, Rhinovirus, proteus mirabilis / H. Influenzae pneumonia. New DVT on anticoagulation. Extubated but required reintubation for hypoxia.  Completed appropriate antibiotics.  Course complicated by ETOH withdrawal and agitated delirium.  No evidence of shunt on TEE.  S/P trach, cortrak.   Social hx elicited 03/28/18 - per RN - homeless, drug abuser, alcoholic  Past Medical History  ETOH Abuse  Cardiomyopathy  Cocaine abuse  COPD (might be severe based on huge barrel chest) Homelessness  Medical non-compliance  Paroxysmal VT Suicide attempt  Tobacco Abuse  DVT  Results for [name], Brian (MRN [id number]) as of 03/25/2018 13:44  Ref. Range 01/06/2018 16:12 02/24/2018 07:41  COCAINE Latest Ref Range: NONE DETECTED  POSITIVE (A) POSITIVE (A)  Tetrahydrocannabinol Latest Ref Range: NONE DETECTED  POSITIVE (A) POSITIVE (A)   Significant Events:  11/29  Admit with hypercarbic resp fx in setting of PNA + AECOPD.  DVT+ 12/05 Precedex   12/07 HFNC 15L, interactive on precedex 12/08 Re-intubated for hypoxia (? atx vs PNA) 12/09 High peep/fio2. Slowly weaning 12/10 CT chest and bubble study as FIO2 and PEEP requirements seem out of proportion to CXR findings.  12/11 Right to left shunt (small via TTE). CT chest w/ bibasilar PNA; TEE ordered. Still not weaning much. TEE was neg for sig shunt. ABX stopped.  12/12 Trach placed. CXR stable. Still requiring high FIO2 and PEEP. Trach aspirate with proteus and H flu 12/13 Cortrack.  12/15 - Tmax 100.2.  I/O- 1L positive in last 24 hours, 10L positive for admit.  RN reports ongoing agitated delirium.   No acute events overnight.  12/16 - fent gtt titrated off. 60% fio3/peep 5+. Follows commands. Afebrile 12/17 - Tmax 101.6 yest, normal today. No events overnight. Currently weaned to 50% and 8 peep  Fentanyl gtt remains at 200 ml/hr with patient intermittently trying to get out of bed  12/18 -On fent gtt, risperdan scheduled, nicotine patch, ativan prn and dialudid prn -> still Ongoing agitation itermittently. CPT held. On 70% fio2 and peep 8   12/19 - 60% fio2, peep 8. Now on dilaudid gtt and precedex gtt with ativan prn, risperdal scheduled and nicotine patch. Did SBT for 1h and then agitated. Aigtation some better controlled with dilaudid but still intermittently very agitated. Also desaturates easy. RN requesting foley back. Being diuresed Social: no family has visited. RN reports homeless state. Palliative consult ordered. XLT trach change done    Procedures  ETT 12/8 >>12/12  RUE PICC 12/9 >> Trach 12/12 >>  (changed to XLT 12/19 >>  Diagnostic tests:  DVT 11/29 >> acute DVT involving the right common femoral vein, right femoral vein, right proximal profunda CT chest 12/10 >> R>L PNA/consolidation left superior seg pulm nodules.  ECHO w/ bubble study 12/10 >> small right to left shunt.  Ejection fraction 40 to 45%, mild diffuse hypokinesis  Cultures:  Tracheal aspirate 12/2 >> proteus mirabilis >> pan sensitive                Haemophilus influenzae  Tracheal aspirate 12/8 >> neg BCx2 12/8 >> negative  RVP 11/29 >> rhinovirus   Antibiotics  Rocephin 12/2 >> 12/11  Interim history/subjective:    12/20 - changed to XLT  Yesterday due to cuff leak. On precedex gtt and dilaudid gtt. On 70% fio2 and peep   Objective   Blood pressure 98/61, pulse 65, temperature (!) 97.4 F (36.3 C), temperature source Axillary, resp. rate 18, height 5\' 7"  (1.702 m), weight 82 kg, SpO2 91 %.    Vent Mode: PRVC FiO2 (%):  [60 %-80 %] 70 % Set Rate:  [18 bmp] 18 bmp Vt Set:  [510 mL] 510  mL PEEP:  [8 cmH20] 8 cmH20 Plateau Pressure:  [15 cmH20-18 cmH20] 16 cmH20   Intake/Output Summary (Last 24 hours) at 03/29/2018 0915 Last data filed at 03/29/2018 0900 Gross per 24 hour  Intake 2458.73 ml  Output 2725 ml  Net -266.27 ml   Filed Weights   03/27/18 0432 03/28/18 0600 03/29/18 0500  Weight: 86.6 kg 84.6 kg 82 kg    General Appearance:  Looks criticall ill OBESE - yes Head:  Normocephalic, without obvious abnormality, atraumatic Eyes:  PERRL - yes, conjunctiva/corneas - clear     Ears:  Normal external ear canals, both ears Nose:  G tube - yes Throat:  ETT TUBE - no , OG tube - no. TRACH  + Neck:  Supple,  No enlargement/tenderness/nodules Lungs: Clear to auscultation bilaterally, Ventilator   Synchrony - yes when quiet Heart:  S1 and S2 normal, no murmur, CVP - no.  Pressors - no Abdomen:  Soft, no masses, no organomegaly Genitalia / Rectal:  Not done Extremities:  Extremities- intact Skin:  ntact in exposed areas . Sacral area - not examined Neurologic:  Sedation - dialudid gtt, precedex gtt -> RASS - -3 . Moves all 4s - yes when agitated. CAM-ICU - positive for delirum during wua . Orientation - not           LABS    PULMONARY Recent Labs  Lab 03/23/18 0347  PHART 7.361  PCO2ART 55.0*  PO2ART 53.0*  HCO3 31.0*  TCO2 33*  O2SAT 83.0    CBC Recent Labs  Lab 03/27/18 0432 03/28/18 0417 03/29/18 0508  HGB 12.7* 12.0* 12.1*  HCT 42.2 38.2* 40.6  WBC 11.2* 9.0 8.5  PLT 318 355 350    COAGULATION Recent Labs  Lab 03/23/18 0229 03/24/18 0558  INR 1.11 1.13    CARDIAC  No results for input(s): TROPONINI in the last 168 hours. No results for input(s): PROBNP in the last 168 hours.   CHEMISTRY Recent Labs  Lab 03/25/18 0511 03/26/18 0222 03/27/18 0432 03/28/18 0020 03/28/18 0417 03/29/18 0002 03/29/18 0508  NA 140 140 139 141  --  141  --   K 3.9 3.9 4.3 3.7  --  4.1  --   CL 99 95* 96* 97*  --  94*  --   CO2 33* 33*  33* 33*  --  34*  --   GLUCOSE 135* 110* 111* 157*  --  156*  --   BUN 21* 22* 18 15  --  23*  --   CREATININE 0.43* 0.56* 0.56* 0.42*  --  0.58*  --   CALCIUM 8.5* 9.1 9.2 8.7*  --  9.1  --   MG  --  1.8 1.9  --  1.9  --  1.9  PHOS  --  3.6 3.6  --  4.2  --  4.8*   Estimated Creatinine Clearance: 100.7 mL/min (A) (by C-G formula based on SCr of 0.58 mg/dL (L)).   LIVER Recent  Labs  Lab 03/23/18 0229 03/24/18 0558  AST 40  --   ALT 49*  --   ALKPHOS 50  --   BILITOT 0.3  --   PROT 5.5*  --   ALBUMIN 2.0*  --   INR 1.11 1.13     INFECTIOUS No results for input(s): LATICACIDVEN, PROCALCITON in the last 168 hours.   ENDOCRINE CBG (last 3)  Recent Labs    03/28/18 2010 03/29/18 0351 03/29/18 0732  GLUCAP 120* 123* 135*   IMAGING x48h  - image(s) personally visualized  -   highlighted in bold No results found.   Assessment & Plan: 21  Days LOS   RESP Baseline: COPD NOS (but big barrel chest so likely severe) Current: Acute on chronic Hypercarbic / Hypoxic Respiratory Failure s/p trach 03/21/18 -in setting of rhinovirus+, proteus/H.influenza PNA, AECOPD  -negative bubble study (small, see above)   03/29/2018 - -> continued failure to do sbt with fio2 70% and agitated delirium  P: PRVC but reduced RR to 12 from 18 to see if air trapping can be improved PSV if tolerated  Trach care per protocol  Chest PT  LTAC but not insurance eligible Will need to remove trach sutures around 12/21  Duoneb QID  Nicotine patch   Pulmonary Nodules P: Will need outpatient imaging in 3-6 months   Acute on Chronic Systolic Heart Failure-EF 35%  -- QTC 443 msec on 03/28/18 -  Peak +12.4L positive on 03/26/18  since admit 02/21/2018 on 03/26/18  - improved to +11.8L positive on 03/29/2018  P: Scheduled lasix    Acute Encephalopathy / Agitated Delirium   03/29/18 - ongoing severe and precluding vent liberalization  P: dilaudid gtt Dilaudid prn Precedex Continue  risperdal Continue klonopin perioidc QTc checks   R Femoral DVT with suspected pulmonary embolism. P: Continue LMWH  Dysphagia.   P: TF per protocol  Will need PEG   Urinary retention 12/19 - did not tolerate being without foley and reinserted foley 03/28/18  P:  foley management I/O  Hyperglycemia P: Monitor - normal off SSI    Best practice   Nutrition: TF DVT: therapeutic LMWH Glucose: n/a Sedation: PAD PUD: Pepcid  Family - none at bedside 12/17 and 03/27/18 and 03/28/18, 03/29/18 Dispo  LTAC ineliglble due to insurance Goal: Call [employer] care consult (homeless, poor prognosis, poor baseline health and chronic criticall illness with > 50% 1 year mortality) - called and left message 03/29/18     ATTESTATION & SIGNATURE   The patient KA FLAMMER is critically ill with multiple organ systems failure and requires high complexity decision making for assessment and support, frequent evaluation and titration of therapies, application of advanced monitoring technologies and extensive interpretation of multiple databases.   Critical Care Time devoted to patient care services described in this note is  30  Minutes. This time reflects time of care of this signee Dr Kalman Shan. This critical care time does not reflect procedure time, or teaching time or supervisory time of PA/NP/Med student/Med Resident etc but could involve care discussion time     Dr. Kalman Shan, M.D., [facility].C.P Pulmonary and Critical Care Medicine Staff Physician [facility] [place] [facility] Pager: [phone], If no answer or between  15:00h - 7:00h: call [phone]  03/29/2018 9:15 AM

## 2018-03-29 NOTE — Plan of Care (Signed)
  Problem: Health Behavior/Discharge Planning: Goal: Ability to manage health-related needs will improve Outcome: Not Progressing   Problem: Clinical Measurements: Goal: Ability to maintain clinical measurements within normal limits will improve Outcome: Not Progressing Goal: Will remain free from infection Outcome: Not Progressing Goal: Diagnostic test results will improve Outcome: Not Progressing Goal: Respiratory complications will improve Outcome: Not Progressing Goal: Cardiovascular complication will be avoided Outcome: Not Progressing   Problem: Activity: Goal: Risk for activity intolerance will decrease Outcome: Not Progressing   Problem: Nutrition: Goal: Adequate nutrition will be maintained Outcome: Not Progressing   Problem: Coping: Goal: Level of anxiety will decrease Outcome: Not Progressing   Problem: Elimination: Goal: Will not experience complications related to bowel motility Outcome: Not Progressing Goal: Will not experience complications related to urinary retention Outcome: Not Progressing   Problem: Pain Managment: Goal: General experience of comfort will improve Outcome: Not Progressing   Problem: Skin Integrity: Goal: Risk for impaired skin integrity will decrease Outcome: Not Progressing   Problem: Activity: Goal: Ability to tolerate increased activity will improve Outcome: Not Progressing   Problem: Role Relationship: Goal: Method of communication will improve Outcome: Not Progressing   

## 2018-03-30 LAB — GLUCOSE, CAPILLARY
Glucose-Capillary: 119 mg/dL — ABNORMAL HIGH (ref 70–99)
Glucose-Capillary: 124 mg/dL — ABNORMAL HIGH (ref 70–99)
Glucose-Capillary: 133 mg/dL — ABNORMAL HIGH (ref 70–99)
Glucose-Capillary: 137 mg/dL — ABNORMAL HIGH (ref 70–99)
Glucose-Capillary: 151 mg/dL — ABNORMAL HIGH (ref 70–99)
Glucose-Capillary: 153 mg/dL — ABNORMAL HIGH (ref 70–99)

## 2018-03-30 LAB — MAGNESIUM: Magnesium: 1.9 mg/dL (ref 1.7–2.4)

## 2018-03-30 LAB — BASIC METABOLIC PANEL
Anion gap: 12 (ref 5–15)
BUN: 25 mg/dL — ABNORMAL HIGH (ref 6–20)
CALCIUM: 9.2 mg/dL (ref 8.9–10.3)
CO2: 36 mmol/L — AB (ref 22–32)
Chloride: 93 mmol/L — ABNORMAL LOW (ref 98–111)
Creatinine, Ser: 0.62 mg/dL (ref 0.61–1.24)
GFR calc Af Amer: >60 mL/min (ref >60)
GFR calc non Af Amer: >60 mL/min (ref >60)
Glucose, Bld: 163 mg/dL — ABNORMAL HIGH (ref 70–99)
Potassium: 4.1 mmol/L (ref 3.5–5.1)
Sodium: 141 mmol/L (ref 135–145)

## 2018-03-30 LAB — CBC WITH DIFFERENTIAL/PLATELET
Abs Immature Granulocytes: 0.04 10*3/uL (ref 0.00–0.07)
Basophils Absolute: 0 10*3/uL (ref 0.0–0.1)
Basophils Relative: 0 %
Eosinophils Absolute: 0.3 10*3/uL (ref 0.0–0.5)
Eosinophils Relative: 3 %
HCT: 41 % (ref 39.0–52.0)
Hemoglobin: 12.4 g/dL — ABNORMAL LOW (ref 13.0–17.0)
Immature Granulocytes: 0 %
Lymphocytes Relative: 21 %
Lymphs Abs: 2 10*3/uL (ref 0.7–4.0)
MCH: 30.8 pg (ref 26.0–34.0)
MCHC: 30.2 g/dL (ref 30.0–36.0)
MCV: 102 fL — ABNORMAL HIGH (ref 80.0–100.0)
Monocytes Absolute: 0.9 10*3/uL (ref 0.1–1.0)
Monocytes Relative: 10 %
Neutro Abs: 6 10*3/uL (ref 1.7–7.7)
Neutrophils Relative %: 66 %
Platelets: 387 10*3/uL (ref 150–400)
RBC: 4.02 MIL/uL — ABNORMAL LOW (ref 4.22–5.81)
RDW: 13 % (ref 11.5–15.5)
WBC: 9.3 10*3/uL (ref 4.0–10.5)
nRBC: 0 % (ref 0.0–0.2)

## 2018-03-30 LAB — PHOSPHORUS: PHOSPHORUS: 4.1 mg/dL (ref 2.5–4.6)

## 2018-03-30 NOTE — Progress Notes (Signed)
NAME:  Brian [name], MRN:  [id number], DOB:  08-14-1955, LOS: 23 ADMISSION DATE:  02/21/2018, CONSULTATION DATE:  02/21/2018 REFERRING MD:  Lamont Snowball - ED, CHIEF COMPLAINT:  Acute respiratory failure.   Brief Summary:  62 year old man who was intubated and transferred from [facility] for hypercapneic respiratory failure in the setting of exacerbation of COPD, Rhinovirus, proteus mirabilis / H. Influenzae pneumonia. New DVT on anticoagulation. Extubated but required reintubation for hypoxia.  Completed appropriate antibiotics.  Course complicated by ETOH withdrawal and agitated delirium.  No evidence of shunt on TEE.  S/P trach, cortrak.   Social hx elicited 03/28/18 - per RN - homeless, drug abuser, alcoholic  Past Medical History  ETOH Abuse  Cardiomyopathy  Cocaine abuse  COPD (might be severe based on huge barrel chest) Homelessness  Medical non-compliance  Paroxysmal VT Suicide attempt  Tobacco Abuse  DVT  Results for Brian [name], Brian [name] (MRN [id number]) as of 03/25/2018 13:44  Ref. Range 01/06/2018 16:12 02/24/2018 07:41  COCAINE Latest Ref Range: NONE DETECTED  POSITIVE (A) POSITIVE (A)  Tetrahydrocannabinol Latest Ref Range: NONE DETECTED  POSITIVE (A) POSITIVE (A)   Significant Events:  11/29  Admit with hypercarbic resp fx in setting of PNA + AECOPD.  DVT+ 12/05 Precedex   12/07 HFNC 15L, interactive on precedex 12/08 Re-intubated for hypoxia (? atx vs PNA) 12/09 High peep/fio2. Slowly weaning 12/10 CT chest and bubble study as FIO2 and PEEP requirements seem out of proportion to CXR findings.  12/11 Right to left shunt (small via TTE). CT chest w/ bibasilar PNA; TEE ordered. Still not weaning much. TEE was neg for sig shunt. ABX stopped.  12/12 Trach placed. CXR stable. Still requiring high FIO2 and PEEP. Trach aspirate with proteus and H flu 12/13 Cortrack.  12/15 - Tmax 100.2.  I/O- 1L positive in last 24 hours, 10L positive for admit.  RN reports ongoing agitated delirium.   No acute events overnight.  12/16 - fent gtt titrated off. 60% fio3/peep 5+. Follows commands. Afebrile 12/17 - Tmax 101.6 yest, normal today. No events overnight. Currently weaned to 50% and 8 peep  Fentanyl gtt remains at 200 ml/hr with patient intermittently trying to get out of bed  12/18 -On fent gtt, risperdan scheduled, nicotine patch, ativan prn and dialudid prn -> still Ongoing agitation itermittently. CPT held. On 70% fio2 and peep 8   12/19 - 60% fio2, peep 8. Now on dilaudid gtt and precedex gtt with ativan prn, risperdal scheduled and nicotine patch. Did SBT for 1h and then agitated. Aigtation some better controlled with dilaudid but still intermittently very agitated. Also desaturates easy. RN requesting foley back. Being diuresed Social: no family has visited. RN reports homeless state. Palliative consult ordered. XLT trach change done  12/21- sedated on dilaudid and precedex. Will attempt an awakening trial.   Procedures  ETT 12/8 >>12/12  RUE PICC 12/9 >> Trach 12/12 >>  (changed to XLT 12/19 >>  Diagnostic tests:  DVT 11/29 >> acute DVT involving the right common femoral vein, right femoral vein, right proximal profunda CT chest 12/10 >> R>L PNA/consolidation left superior seg pulm nodules.  ECHO w/ bubble study 12/10 >> small right to left shunt.  Ejection fraction 40 to 45%, mild diffuse hypokinesis  Cultures:  Tracheal aspirate 12/2 >> proteus mirabilis >> pan sensitive                Haemophilus influenzae  Tracheal aspirate 12/8 >> neg BCx2 12/8 >> negative  RVP  11/29 >> rhinovirus   Antibiotics  Rocephin 12/2 >> 12/11  Interim history/subjective:    12/20 - changed to XLT  Yesterday due to cuff leak. On precedex gtt and dilaudid gtt. On 70% fio2 and peep   Objective   Blood pressure 105/64, pulse 63, temperature 98.8 F (37.1 C), temperature source Oral, resp. rate 17, height 5\' 7"  (1.702 m), weight 82.7 kg, SpO2 91 %.    Vent Mode: PRVC FiO2 (%):   [50 %-60 %] 50 % Set Rate:  [12 bmp] 12 bmp Vt Set:  [510 mL] 510 mL PEEP:  [5 cmH20-8 cmH20] 5 cmH20 Plateau Pressure:  [14 cmH20-16 cmH20] 16 cmH20   Intake/Output Summary (Last 24 hours) at 03/30/2018 1248 Last data filed at 03/30/2018 1200 Gross per 24 hour  Intake 2728.35 ml  Output 2470 ml  Net 258.35 ml   Filed Weights   03/28/18 0600 03/29/18 0500 03/30/18 0401  Weight: 84.6 kg 82 kg 82.7 kg    General Appearance:  Looks criticall ill OBESE - yes Head:  Normocephalic, without obvious abnormality, atraumatic Eyes:  PERRL - yes, conjunctiva/corneas - clear     Ears:  Normal external ear canals, both ears Nose:  G tube - yes Throat:  ETT TUBE - no , OG tube - no. TRACH  + Neck:  Supple,  No enlargement/tenderness/nodules. No JVD. Lungs: Clear to auscultation bilaterally, Ventilator   Synchrony - synchronized at this time. Heart:  S1 and S2 normal, no murmur, CVP - no.  Pressors - no Abdomen:  Soft, no masses, no organomegaly Genitalia / Rectal:  Not done Extremities:  Extremities- intact Skin:  intact in exposed areas .  Neurologic:  Sedation - dialudid gtt, precedex gtt -> RASS - -3 . Moves all 4s - yes when agitated. CAM-ICU - positive for delirum during wua . Orientation - not available.           LABS    PULMONARY No results for input(s): PHART, PCO2ART, PO2ART, HCO3, TCO2, O2SAT in the last 168 hours.  Invalid input(s): PCO2, PO2  CBC Recent Labs  Lab 03/28/18 0417 03/29/18 0508 03/30/18 0400  HGB 12.0* 12.1* 12.4*  HCT 38.2* 40.6 41.0  WBC 9.0 8.5 9.3  PLT 355 350 387    COAGULATION Recent Labs  Lab 03/24/18 0558  INR 1.13    CARDIAC  No results for input(s): TROPONINI in the last 168 hours. No results for input(s): PROBNP in the last 168 hours.   CHEMISTRY Recent Labs  Lab 03/26/18 0222 03/27/18 0432 03/28/18 0020 03/28/18 0417 03/29/18 0002 03/29/18 0508 03/30/18 0001 03/30/18 0400  NA 140 139 141  --  141  --  141  --     K 3.9 4.3 3.7  --  4.1  --  4.1  --   CL 95* 96* 97*  --  94*  --  93*  --   CO2 33* 33* 33*  --  34*  --  36*  --   GLUCOSE 110* 111* 157*  --  156*  --  163*  --   BUN 22* 18 15  --  23*  --  25*  --   CREATININE 0.56* 0.56* 0.42*  --  0.58*  --  0.62  --   CALCIUM 9.1 9.2 8.7*  --  9.1  --  9.2  --   MG 1.8 1.9  --  1.9  --  1.9  --  1.9  PHOS 3.6 3.6  --  4.2  --  4.8*  --  4.1   Estimated Creatinine Clearance: 101 mL/min (by C-G formula based on SCr of 0.62 mg/dL).   LIVER Recent Labs  Lab 03/24/18 0558  INR 1.13     INFECTIOUS No results for input(s): LATICACIDVEN, PROCALCITON in the last 168 hours.   ENDOCRINE CBG (last 3)  Recent Labs    03/30/18 0349 03/30/18 0755 03/30/18 1123  GLUCAP 133* 124* 119*   IMAGING x48h  - image(s) personally visualized  -   highlighted in bold Dg Chest Port 1 View  Result Date: 03/29/2018 CLINICAL DATA:  Tracheostomy. EXAM: PORTABLE CHEST 1 VIEW COMPARISON:  03/27/2018. FINDINGS: Stable enlarged cardiac silhouette. Increased bibasilar pleural fluid and ill-defined increased density. Mild increase in prominence of the pulmonary vasculature. Stable prominence of the interstitial markings. Tracheostomy tube in satisfactory position. Feeding tube extending into the inferior mediastinum and not visualized inferiorly due to under penetration of that portion of the image. Right PICC tip at the superior cavoatrial junction. Unremarkable bones. IMPRESSION: 1. Increased bibasilar atelectasis and pleural fluid. 2. Stable cardiomegaly and chronic interstitial lung disease with interval mild pulmonary vascular congestion. Electronically Signed   By: Beckie Salts M.D.   On: 03/29/2018 10:43     Assessment & Plan: 22  Days LOS   RESP Baseline: COPD NOS (but big barrel chest so likely severe) Current: Acute on chronic Hypercarbic / Hypoxic Respiratory Failure s/p trach 03/21/18 -in setting of rhinovirus+, proteus/H.influenza PNA, AECOPD   -negative bubble study (small, see above)   P: PRVC but reduced RR to 12 from 18 to see if air trapping can be improved PSV if tolerated. Nurse to perform an awakening trial first today. Trach care per protocol  Chest PT  LTAC but not insurance eligible Will need to remove trach sutures around 12/21  Duoneb QID  Nicotine patch   Pulmonary Nodules P: Will need outpatient imaging in 3-6 months   Acute on Chronic Systolic Heart Failure-EF 35%  -- QTC 443 msec on 03/28/18 -  Peak +12.4L positive on 03/26/18  since admit 2018-03-22 on 03/26/18  - improved to +11.8L positive on 03/30/2018  P: Scheduled lasix    Acute Encephalopathy / Agitated Delirium   03/29/18 - ongoing severe and precluding vent liberalization  P: dilaudid gtt Dilaudid prn Precedex Continue risperdal Continue klonopin perioidc QTc checks; no change in duration from 12/7 to 12/19. Follow up ecg on 12/22   R Femoral DVT with suspected pulmonary embolism. P: Continue LMWH No change in platelet count.  Dysphagia.   P: TF per protocol  Will need PEG   Urinary retention 12/19 - did not tolerate being without foley and reinserted foley 03/28/18  P:  foley management I/O  Hyperglycemia P: Monitor - normal off SSI. Glucose 111-163 over past 24 hours.   Best practice   Nutrition: TF DVT: therapeutic LMWH Glucose: n/a Sedation: PAD PUD: Pepcid  Family - none at bedside 12/17 and 03/27/18 and 03/28/18, 03/29/18, 03/30/18 Dispo  LTAC ineliglble due to insurance Goal: Call [employer] care consult (homeless, poor prognosis, poor baseline health and chronic criticall illness with > 50% 1 year mortality) - called and left message 03/29/18     ATTESTATION & SIGNATURE   The patient Brian [name] is critically ill with multiple organ systems failure and requires high complexity decision making for assessment and support, frequent evaluation and titration of therapies, application of advanced  monitoring technologies and extensive interpretation of multiple databases.   Critical  Care Time devoted to patient care services described in this note is  35  Minutes. This time reflects time of care of this signee Dr Jannet Askew. This critical care time does not reflect procedure time, or teaching time or supervisory time of PA/NP/Med student/Med Resident etc but could involve care discussion time     Jannet Askew, MD Pulmonary and Critical Care Medicine Staff Physician [facility] Pulmonary and Critical Care Pager: [phone]  03/30/2018 12:48 PM

## 2018-03-30 NOTE — Plan of Care (Signed)
  Problem: Health Behavior/Discharge Planning: Goal: Ability to manage health-related needs will improve Outcome: Not Progressing   Problem: Clinical Measurements: Goal: Ability to maintain clinical measurements within normal limits will improve Outcome: Not Progressing Goal: Will remain free from infection Outcome: Not Progressing Goal: Diagnostic test results will improve Outcome: Not Progressing Goal: Respiratory complications will improve Outcome: Not Progressing Goal: Cardiovascular complication will be avoided Outcome: Not Progressing   Problem: Activity: Goal: Risk for activity intolerance will decrease Outcome: Not Progressing   Problem: Nutrition: Goal: Adequate nutrition will be maintained Outcome: Not Progressing   Problem: Coping: Goal: Level of anxiety will decrease Outcome: Not Progressing   Problem: Elimination: Goal: Will not experience complications related to bowel motility Outcome: Not Progressing Goal: Will not experience complications related to urinary retention Outcome: Not Progressing   Problem: Pain Managment: Goal: General experience of comfort will improve Outcome: Not Progressing   Problem: Skin Integrity: Goal: Risk for impaired skin integrity will decrease Outcome: Not Progressing   Problem: Activity: Goal: Ability to tolerate increased activity will improve Outcome: Not Progressing   Problem: Role Relationship: Goal: Method of communication will improve Outcome: Not Progressing   

## 2018-03-30 NOTE — Progress Notes (Signed)
Attempted to reach pt's brother again, Ferando Skillin [phone]. No answer or means to leave a message. Awaiting SW input to see if we can locate family  Thank you, Eduard Roux, ANP  Palliative Medicine Team

## 2018-03-31 LAB — PHOSPHORUS: Phosphorus: 4.3 mg/dL (ref 2.5–4.6)

## 2018-03-31 LAB — GLUCOSE, CAPILLARY
Glucose-Capillary: 121 mg/dL — ABNORMAL HIGH (ref 70–99)
Glucose-Capillary: 129 mg/dL — ABNORMAL HIGH (ref 70–99)
Glucose-Capillary: 132 mg/dL — ABNORMAL HIGH (ref 70–99)
Glucose-Capillary: 137 mg/dL — ABNORMAL HIGH (ref 70–99)
Glucose-Capillary: 145 mg/dL — ABNORMAL HIGH (ref 70–99)
Glucose-Capillary: 145 mg/dL — ABNORMAL HIGH (ref 70–99)
Glucose-Capillary: 147 mg/dL — ABNORMAL HIGH (ref 70–99)

## 2018-03-31 LAB — CBC WITH DIFFERENTIAL/PLATELET
Abs Immature Granulocytes: 0.05 10*3/uL (ref 0.00–0.07)
Basophils Absolute: 0 10*3/uL (ref 0.0–0.1)
Basophils Relative: 0 %
Eosinophils Absolute: 0.2 10*3/uL (ref 0.0–0.5)
Eosinophils Relative: 2 %
HCT: 41 % (ref 39.0–52.0)
HEMOGLOBIN: 12.5 g/dL — AB (ref 13.0–17.0)
Immature Granulocytes: 1 %
Lymphocytes Relative: 22 %
Lymphs Abs: 2.1 10*3/uL (ref 0.7–4.0)
MCH: 30.9 pg (ref 26.0–34.0)
MCHC: 30.5 g/dL (ref 30.0–36.0)
MCV: 101.2 fL — ABNORMAL HIGH (ref 80.0–100.0)
MONO ABS: 0.9 10*3/uL (ref 0.1–1.0)
Monocytes Relative: 9 %
Neutro Abs: 6.3 10*3/uL (ref 1.7–7.7)
Neutrophils Relative %: 66 %
Platelets: 400 10*3/uL (ref 150–400)
RBC: 4.05 MIL/uL — ABNORMAL LOW (ref 4.22–5.81)
RDW: 13 % (ref 11.5–15.5)
WBC: 9.5 10*3/uL (ref 4.0–10.5)
nRBC: 0 % (ref 0.0–0.2)

## 2018-03-31 LAB — MAGNESIUM: Magnesium: 1.8 mg/dL (ref 1.7–2.4)

## 2018-03-31 MED ORDER — DEXMEDETOMIDINE HCL IN NACL 400 MCG/100ML IV SOLN
0.4000 ug/kg/h | INTRAVENOUS | Status: DC
  Administered 2018-03-31 (×4): 1 ug/kg/h via INTRAVENOUS
  Administered 2018-04-01 (×2): 1.2 ug/kg/h via INTRAVENOUS
  Administered 2018-04-01: 1 ug/kg/h via INTRAVENOUS
  Administered 2018-04-01 – 2018-04-02 (×3): 1.2 ug/kg/h via INTRAVENOUS
  Administered 2018-04-02 (×4): 1 ug/kg/h via INTRAVENOUS
  Administered 2018-04-03: 0.8 ug/kg/h via INTRAVENOUS
  Administered 2018-04-03 (×2): 1.2 ug/kg/h via INTRAVENOUS
  Administered 2018-04-03: 0.8 ug/kg/h via INTRAVENOUS
  Administered 2018-04-04: 1.2 ug/kg/h via INTRAVENOUS
  Administered 2018-04-04 (×2): 0.7 ug/kg/h via INTRAVENOUS
  Administered 2018-04-05 (×3): 1.2 ug/kg/h via INTRAVENOUS
  Administered 2018-04-05: 1 ug/kg/h via INTRAVENOUS
  Administered 2018-04-05: 1.2 ug/kg/h via INTRAVENOUS
  Administered 2018-04-05: 1 ug/kg/h via INTRAVENOUS
  Administered 2018-04-06 – 2018-04-07 (×5): 1.2 ug/kg/h via INTRAVENOUS
  Administered 2018-04-07: 1 ug/kg/h via INTRAVENOUS
  Filled 2018-03-31 (×8): qty 100
  Filled 2018-03-31: qty 200
  Filled 2018-03-31 (×21): qty 100
  Filled 2018-03-31: qty 200
  Filled 2018-03-31: qty 100
  Filled 2018-03-31: qty 200
  Filled 2018-03-31: qty 100

## 2018-03-31 NOTE — Progress Notes (Signed)
NAME:  Brian [name], MRN:  [id number], DOB:  10/22/1955, LOS: 24 ADMISSION DATE:  March 28, 2018, CONSULTATION DATE:  03-28-2018 REFERRING MD:  Lamont Snowball - ED, CHIEF COMPLAINT:  Acute respiratory failure.   Brief Summary:  62 year old man who was intubated and transferred from [facility] for hypercapneic respiratory failure in the setting of exacerbation of COPD, Rhinovirus, proteus mirabilis / H. Influenzae pneumonia. New DVT on anticoagulation. Extubated but required reintubation for hypoxia.  Completed appropriate antibiotics.  Course complicated by ETOH withdrawal and agitated delirium.  No evidence of shunt on TEE.  S/P trach, cortrak.   Social hx elicited 03/28/18 - per RN - homeless, drug abuser, alcoholic  Past Medical History  ETOH Abuse  Cardiomyopathy  Cocaine abuse  COPD (might be severe based on huge barrel chest) Homelessness  Medical non-compliance  Paroxysmal VT Suicide attempt  Tobacco Abuse  DVT  Results for Brian [name], Brian [name] (MRN [id number]) as of 03/25/2018 13:44  Ref. Range 01/06/2018 16:12 02/24/2018 07:41  COCAINE Latest Ref Range: NONE DETECTED  POSITIVE (A) POSITIVE (A)  Tetrahydrocannabinol Latest Ref Range: NONE DETECTED  POSITIVE (A) POSITIVE (A)   Significant Events:  11/29  Admit with hypercarbic resp fx in setting of PNA + AECOPD.  DVT+ 12/05 Precedex   12/07 HFNC 15L, interactive on precedex 12/08 Re-intubated for hypoxia (? atx vs PNA) 12/09 High peep/fio2. Slowly weaning 12/10 CT chest and bubble study as FIO2 and PEEP requirements seem out of proportion to CXR findings.  12/11 Right to left shunt (small via TTE). CT chest w/ bibasilar PNA; TEE ordered. Still not weaning much. TEE was neg for sig shunt. ABX stopped.  12/12 Trach placed. CXR stable. Still requiring high FIO2 and PEEP. Trach aspirate with proteus and H flu 12/13 Cortrack.  12/15 - Tmax 100.2.  I/O- 1L positive in last 24 hours, 10L positive for admit.  RN reports ongoing agitated delirium.   No acute events overnight.  12/16 - fent gtt titrated off. 60% fio3/peep 5+. Follows commands. Afebrile 12/17 - Tmax 101.6 yest, normal today. No events overnight. Currently weaned to 50% and 8 peep  Fentanyl gtt remains at 200 ml/hr with patient intermittently trying to get out of bed  12/18 -On fent gtt, risperdan scheduled, nicotine patch, ativan prn and dialudid prn -> still Ongoing agitation itermittently. CPT held. On 70% fio2 and peep 8   12/19 - 60% fio2, peep 8. Now on dilaudid gtt and precedex gtt with ativan prn, risperdal scheduled and nicotine patch. Did SBT for 1h and then agitated. Aigtation some better controlled with dilaudid but still intermittently very agitated. Also desaturates easy. RN requesting foley back. Being diuresed Social: no family has visited. RN reports homeless state. Palliative consult ordered. XLT trach change done  12/21- sedated on dilaudid and precedex. Will attempt an awakening trial. 12/22- unable to tolerate holding of sedation yesterday and today.   Procedures  ETT 12/8 >>12/12  RUE PICC 12/9 >> Trach 12/12 >>  (changed to XLT 12/19 >>  Diagnostic tests:  DVT 11/29 >> acute DVT involving the right common femoral vein, right femoral vein, right proximal profunda CT chest 12/10 >> R>L PNA/consolidation left superior seg pulm nodules.  ECHO w/ bubble study 12/10 >> small right to left shunt.  Ejection fraction 40 to 45%, mild diffuse hypokinesis  Cultures:  Tracheal aspirate 12/2 >> proteus mirabilis >> pan sensitive                Haemophilus influenzae  Tracheal  aspirate 12/8 >> neg BCx2 12/8 >> negative  RVP 11/29 >> rhinovirus   Antibiotics  Rocephin 12/2 >> 12/11  Interim history/subjective:    12/20 - changed to XLT  Yesterday due to cuff leak. On precedex gtt and dilaudid gtt. On 70% fio2 and peep   Objective   Blood pressure (!) 91/58, pulse 61, temperature 99.5 F (37.5 C), temperature source Oral, resp. rate 20, height 5\' 7"   (1.702 m), weight 81.8 kg, SpO2 93 %.    Vent Mode: PRVC FiO2 (%):  [70 %-100 %] 70 % Set Rate:  [12 bmp] 12 bmp Vt Set:  [510 mL] 510 mL PEEP:  [8 cmH20] 8 cmH20 Plateau Pressure:  [13 cmH20-18 cmH20] 16 cmH20   Intake/Output Summary (Last 24 hours) at 03/31/2018 1739 Last data filed at 03/31/2018 1624 Gross per 24 hour  Intake 1607.47 ml  Output 2500 ml  Net -892.53 ml   Filed Weights   03/29/18 0500 03/30/18 0401 03/31/18 0410  Weight: 82 kg 82.7 kg 81.8 kg    General Appearance:  Currently sedate, RASS -3 Head:  Normocephalic, without obvious abnormality, atraumatic Eyes:  PERRL - yes, conjunctiva/corneas - clear   No new findings.  Ears:  Normal external ear canals, both ears Nose:  G tube - yes Throat:  ETT TUBE - no , OG tube - no. TRACH  Neck:  Supple,  No enlargement/tenderness/nodules. No JVD. + no air leak. No dehiscence of the trach wound. Lungs: scattered rhonchi, no wheezes/crackles. Ventilator   Synchrony - synchronized at this time. Heart:  S1 and S2 normal, no murmur, CVP - no.  Pressors - no Abdomen:  Soft, no masses, no organomegaly Genitalia / Rectal:  Not done Extremities:  Extremities- intact Skin:  intact in exposed areas .  Neurologic:  Sedation - dialudid gtt, precedex gtt -> RASS - -3 . Moves all 4s - yes when agitated. CAM-ICU - positive for delirum during wua . Orientation - not available.           LABS    PULMONARY No results for input(s): PHART, PCO2ART, PO2ART, HCO3, TCO2, O2SAT in the last 168 hours.  Invalid input(s): PCO2, PO2  CBC Recent Labs  Lab 03/29/18 0508 03/30/18 0400 03/31/18 0411  HGB 12.1* 12.4* 12.5*  HCT 40.6 41.0 41.0  WBC 8.5 9.3 9.5  PLT 350 387 400    COAGULATION No results for input(s): INR in the last 168 hours.  CARDIAC  No results for input(s): TROPONINI in the last 168 hours. No results for input(s): PROBNP in the last 168 hours.   CHEMISTRY Recent Labs  Lab 03/26/18 0222 03/27/18 0432  03/28/18 0020 03/28/18 0417 03/29/18 0002 03/29/18 0508 03/30/18 0001 03/30/18 0400 03/31/18 0411  NA 140 139 141  --  141  --  141  --   --   K 3.9 4.3 3.7  --  4.1  --  4.1  --   --   CL 95* 96* 97*  --  94*  --  93*  --   --   CO2 33* 33* 33*  --  34*  --  36*  --   --   GLUCOSE 110* 111* 157*  --  156*  --  163*  --   --   BUN 22* 18 15  --  23*  --  25*  --   --   CREATININE 0.56* 0.56* 0.42*  --  0.58*  --  0.62  --   --  CALCIUM 9.1 9.2 8.7*  --  9.1  --  9.2  --   --   MG 1.8 1.9  --  1.9  --  1.9  --  1.9 1.8  PHOS 3.6 3.6  --  4.2  --  4.8*  --  4.1 4.3   Estimated Creatinine Clearance: 100.6 mL/min (by C-G formula based on SCr of 0.62 mg/dL).   LIVER No results for input(s): AST, ALT, ALKPHOS, BILITOT, PROT, ALBUMIN, INR in the last 168 hours.   INFECTIOUS No results for input(s): LATICACIDVEN, PROCALCITON in the last 168 hours.   ENDOCRINE CBG (last 3)  Recent Labs    03/31/18 1227 03/31/18 1552 03/31/18 1559  GLUCAP 147* 145* 137*   IMAGING x48h  - image(s) personally visualized  -   highlighted in bold No results found.   Assessment & Plan: 23  Days LOS   RESP Baseline: COPD NOS (but big barrel chest so likely severe) Current: Acute on chronic Hypercarbic / Hypoxic Respiratory Failure s/p trach 03/21/18 -in setting of rhinovirus+, proteus/H.influenza PNA, AECOPD  -negative bubble study (small, see above)   P: PRVC but reduced RR to 12 from 18 to see if air trapping can be improved PSV if tolerated. Attempt when successful with awakening trial. Trach care per protocol  Chest PT  LTAC but not insurance eligible Will need to remove trach sutures around 12/21  Duoneb QID  Nicotine patch   Pulmonary Nodules P: Will need outpatient imaging in 3-6 months   Acute on Chronic Systolic Heart Failure-EF 35%  -- QTC 443 msec on 03/28/18 -  Peak +12.4L positive on 03/26/18  since admit 08/27/2017 on 03/26/18  - improved to +11.8L positive on  03/31/2018  P: Scheduled lasix    Acute Encephalopathy / Agitated Delirium   03/29/18 - ongoing severe and precluding vent liberalization  P: dilaudid gtt Dilaudid prn Precedex Continue risperdal Continue klonopin perioidc QTc checks; no change in duration from 12/7 to 12/19. Follow up ecg on 12/22 shows a QTc of 428 msec.   R Femoral DVT with suspected pulmonary embolism. P: Continue LMWH No change in platelet count.  Dysphagia.   P: TF per protocol  Will need PEG   Urinary retention 12/19 - did not tolerate being without foley and reinserted foley 03/28/18  P:  foley management I/O  Hyperglycemia P: Monitor - normal off SSI. Glucose 111-163 over past 24 hours.   Best practice   Nutrition: TF DVT: therapeutic LMWH Glucose: n/a Sedation: PAD PUD: Pepcid  Family - none at bedside 12/17 and 03/27/18 and 03/28/18, 03/29/18, 03/30/18 Dispo  LTAC ineliglble due to insurance Goal: Call [employer] care consult (homeless, poor prognosis, poor baseline health and chronic criticall illness with > 50% 1 year mortality) - called and left message 03/29/18     ATTESTATION & SIGNATURE   The patient Elsie SaasRichard B Griffiths is critically ill with multiple organ systems failure and requires high complexity decision making for assessment and support, frequent evaluation and titration of therapies, application of advanced monitoring technologies and extensive interpretation of multiple databases.   Critical Care Time devoted to patient care services described in this note is  35  Minutes. This time reflects time of care of this signee Dr Jannet AskewMichael Rada Zegers. This critical care time does not reflect procedure time, or teaching time or supervisory time of PA/NP/Med student/Med Resident etc but could involve care discussion time     Jannet AskewMichael Gizell Danser, MD Pulmonary and Critical Care Medicine Staff Physician  [facility] Pulmonary and Critical Care Pager: [phone]  03/31/2018 5:39 PM

## 2018-04-01 ENCOUNTER — Encounter (HOSPITAL_COMMUNITY): Payer: Self-pay | Admitting: *Deleted

## 2018-04-01 ENCOUNTER — Inpatient Hospital Stay (HOSPITAL_COMMUNITY): Payer: Medicaid Other

## 2018-04-01 LAB — CBC WITH DIFFERENTIAL/PLATELET
Abs Immature Granulocytes: 0.05 10*3/uL (ref 0.00–0.07)
Basophils Absolute: 0 10*3/uL (ref 0.0–0.1)
Basophils Relative: 0 %
Eosinophils Absolute: 0.3 10*3/uL (ref 0.0–0.5)
Eosinophils Relative: 3 %
HCT: 41.1 % (ref 39.0–52.0)
Hemoglobin: 12.6 g/dL — ABNORMAL LOW (ref 13.0–17.0)
Immature Granulocytes: 1 %
Lymphocytes Relative: 20 %
Lymphs Abs: 1.9 10*3/uL (ref 0.7–4.0)
MCH: 31.3 pg (ref 26.0–34.0)
MCHC: 30.7 g/dL (ref 30.0–36.0)
MCV: 102 fL — ABNORMAL HIGH (ref 80.0–100.0)
MONO ABS: 0.9 10*3/uL (ref 0.1–1.0)
Monocytes Relative: 9 %
Neutro Abs: 6.5 10*3/uL (ref 1.7–7.7)
Neutrophils Relative %: 67 %
Platelets: 371 10*3/uL (ref 150–400)
RBC: 4.03 MIL/uL — ABNORMAL LOW (ref 4.22–5.81)
RDW: 13.2 % (ref 11.5–15.5)
WBC: 9.6 10*3/uL (ref 4.0–10.5)
nRBC: 0 % (ref 0.0–0.2)

## 2018-04-01 LAB — GLUCOSE, CAPILLARY
Glucose-Capillary: 130 mg/dL — ABNORMAL HIGH (ref 70–99)
Glucose-Capillary: 140 mg/dL — ABNORMAL HIGH (ref 70–99)
Glucose-Capillary: 143 mg/dL — ABNORMAL HIGH (ref 70–99)
Glucose-Capillary: 109 mg/dL — ABNORMAL HIGH (ref 70–99)
Glucose-Capillary: 137 mg/dL — ABNORMAL HIGH (ref 70–99)

## 2018-04-01 LAB — MAGNESIUM: Magnesium: 1.7 mg/dL (ref 1.7–2.4)

## 2018-04-01 LAB — BASIC METABOLIC PANEL
Anion gap: 11 (ref 5–15)
BUN: 28 mg/dL — ABNORMAL HIGH (ref 6–20)
CO2: 37 mmol/L — ABNORMAL HIGH (ref 22–32)
Calcium: 9.2 mg/dL (ref 8.9–10.3)
Chloride: 95 mmol/L — ABNORMAL LOW (ref 98–111)
Creatinine, Ser: 0.6 mg/dL — ABNORMAL LOW (ref 0.61–1.24)
GFR calc Af Amer: >60 mL/min (ref >60)
GFR calc non Af Amer: >60 mL/min (ref >60)
Glucose, Bld: 163 mg/dL — ABNORMAL HIGH (ref 70–99)
Potassium: 3.8 mmol/L (ref 3.5–5.1)
Sodium: 143 mmol/L (ref 135–145)

## 2018-04-01 LAB — PHOSPHORUS: Phosphorus: 4.3 mg/dL (ref 2.5–4.6)

## 2018-04-01 MED ORDER — PRO-STAT SUGAR FREE PO LIQD
30.0000 mL | Freq: Every day | ORAL | Status: DC
  Administered 2018-04-01 – 2018-04-06 (×6): 30 mL
  Filled 2018-04-01 (×5): qty 30

## 2018-04-01 MED ORDER — VITAL AF 1.2 CAL PO LIQD
1000.0000 mL | ORAL | Status: DC
  Administered 2018-04-01: 1000 mL
  Administered 2018-04-02: 60 mL
  Administered 2018-04-02: 60 mL/h
  Administered 2018-04-03 – 2018-04-06 (×5): 1000 mL

## 2018-04-01 MED ORDER — NICOTINE 14 MG/24HR TD PT24
14.0000 mg | MEDICATED_PATCH | Freq: Every day | TRANSDERMAL | Status: DC
  Administered 2018-04-02: 14 mg via TRANSDERMAL
  Filled 2018-04-01: qty 1

## 2018-04-01 MED ORDER — BETHANECHOL CHLORIDE 5 MG PO TABS
5.0000 mg | ORAL_TABLET | Freq: Every day | ORAL | Status: DC
  Filled 2018-04-01: qty 1

## 2018-04-01 MED ORDER — IOPAMIDOL (ISOVUE-370) INJECTION 76%
INTRAVENOUS | Status: AC
  Administered 2018-04-01: 60 mL
  Filled 2018-04-01: qty 100

## 2018-04-01 MED ORDER — OLANZAPINE 10 MG IM SOLR
5.0000 mg | Freq: Three times a day (TID) | INTRAMUSCULAR | Status: DC | PRN
  Filled 2018-04-01: qty 10

## 2018-04-01 MED ORDER — MAGNESIUM SULFATE 2 GM/50ML IV SOLN
2.0000 g | Freq: Once | INTRAVENOUS | Status: AC
  Administered 2018-04-01: 2 g via INTRAVENOUS
  Filled 2018-04-01: qty 50

## 2018-04-01 MED ORDER — RISPERIDONE 1 MG PO TABS
1.0000 mg | ORAL_TABLET | Freq: Two times a day (BID) | ORAL | Status: DC
  Administered 2018-04-01 – 2018-04-06 (×10): 1 mg
  Filled 2018-04-01 (×10): qty 1

## 2018-04-01 MED ORDER — VALPROATE SODIUM 500 MG/5ML IV SOLN
250.0000 mg | Freq: Three times a day (TID) | INTRAVENOUS | Status: DC
  Administered 2018-04-01 – 2018-04-02 (×3): 250 mg via INTRAVENOUS
  Filled 2018-04-01 (×4): qty 2.5

## 2018-04-01 MED ORDER — BETHANECHOL CHLORIDE 5 MG PO TABS
5.0000 mg | ORAL_TABLET | Freq: Three times a day (TID) | ORAL | Status: DC
  Administered 2018-04-01 – 2018-04-06 (×15): 5 mg
  Filled 2018-04-01 (×16): qty 1

## 2018-04-01 MED ORDER — CHLORHEXIDINE GLUCONATE 0.12 % MT SOLN
OROMUCOSAL | Status: AC
  Filled 2018-04-01: qty 15

## 2018-04-01 MED ORDER — BETHANECHOL CHLORIDE 5 MG PO TABS
5.0000 mg | ORAL_TABLET | Freq: Three times a day (TID) | ORAL | Status: DC
  Filled 2018-04-01 (×2): qty 1

## 2018-04-01 NOTE — Progress Notes (Signed)
Initial Nutrition Assessment  DOCUMENTATION CODES:   Not applicable  INTERVENTION:   Tube Feeding:  Change Vital AF 1.2 to 60 ml/hr Add Pro-Stat 30 mL daily Provides 1828 kcals, 123 g of protein and 1166 mL of free water Meets 100% estimated calorie and protein needs   NUTRITION DIAGNOSIS:   Inadequate oral intake related to inability to eat as evidenced by NPO status.  Being addressed via TF   GOAL:   Patient will meet greater than or equal to 90% of their needs  Met  MONITOR:   Vent status, Labs, I & O's, Weight trends, TF tolerance  REASON FOR ASSESSMENT:   Ventilator, Consult Enteral/tube feeding initiation and management  ASSESSMENT:   62 year old male who presented to the [facility] ED on 11/29 with acute respiratory failure. Pt was intubated in the ED. PMH significant for COPD, CHF, substance use, tobacco use, and multiple prior hospitalizations with pt leaving AMA.   11/29 Intubated 12/03 Extubated 12/04 NPO, Cortrak Placed 12/08 Re-Intubated 12/12 Trach Placed, Cortrak clogged 12/13 Cortrak Re-placed, post-pyloric  Pt remains agitated at times, pt on precedex and hydromorphone drips Patient remains on vent support via trach, requiring high FiO2. Palliative care consulted but unable to get in touch with family MV: 7.6 L/min Temp (24hrs), Avg:99.4 F (37.4 C), Min:99 F (37.2 C), Max:99.8 F (37.7 C)  Tolerating Vital AF 1.2  @ 70 ml/hr via Cortrak tube  Current wt 80.7 kg, net positive 7 L per I/O flow sheet. Edema present on exam; unsure if dry weight  No new pressure injuries identified; stage II on sacrum  Labs: CBGs 232-147 Meds: lasix  Diet Order:   Diet Order    None      EDUCATION NEEDS:   Not appropriate for education at this time  Skin:  Skin Assessment: Skin Integrity Issues: Skin Integrity Issues:: Stage II Stage II: sacum  Last BM:  12/21  Height:   Ht Readings from Last 1 Encounters:  02/10/2018 _0  (1.702 m)     Weight:   Wt Readings from Last 1 Encounters:  04/01/18 80.7 kg    Ideal Body Weight:  67.3 kg  BMI:  Body mass index is 27.86 kg/m.  Estimated Nutritional Needs:   Kcal:  9927 kcals   Protein:  120-145 g  Fluid:  >/= 2 L   Kerman Passey MS, RD, LDN, CNSC [phone] Pager  [phone] Weekend/On-Call Pager

## 2018-04-01 NOTE — Progress Notes (Addendum)
NAME:  Brian [name], MRN:  [id number], DOB:  Oct 27, 1955, LOS: 23 ADMISSION DATE:  02/22/2018, CONSULTATION DATE:  02/15/2018 REFERRING MD:  Lamont Snowball - ED, CHIEF COMPLAINT:  Acute respiratory failure.   Brief Summary:  62 year old man who was intubated and transferred from [facility] for hypercapneic respiratory failure in the setting of exacerbation of COPD, Rhinovirus, proteus mirabilis / H. Influenzae pneumonia. New DVT on anticoagulation. Extubated but required reintubation for hypoxia.  Completed appropriate antibiotics.  Course complicated by ETOH withdrawal and agitated delirium.  No evidence of shunt on TEE.  S/P trach, cortrak.   Social hx elicited 03/28/18 - per RN - homeless, drug abuser, alcoholic  Past Medical History  ETOH Abuse Cardiomyopathy  Cocaine abuse  COPD (likely severe based on huge barrel chest) Homelessness  Medical non-compliance  Paroxysmal VT Suicide attempt  Tobacco Abuse  DVT  Significant Events:  11/29  Admit with hypercarbic resp fx in setting of PNA + AECOPD.  DVT+ 12/05 Precedex   12/07 HFNC 15L, interactive on precedex 12/08 Re-intubated for hypoxia (? atx vs PNA) 12/09 High peep/fio2. Slowly weaning 12/10 CT chest and bubble study as FIO2 and PEEP requirements seem out of proportion to CXR findings.  12/11 Right to left shunt (small via TTE). CT chest w/ bibasilar PNA; TEE ordered. Still not weaning much. TEE was neg for sig shunt. ABX stopped.  12/12 Trach placed. CXR stable. Still requiring high FIO2 and PEEP. Trach aspirate with proteus and H flu 12/13 Cortrack.  12/15 - Tmax 100.2.  I/O- 1L positive in last 24 hours, 10L positive for admit.  RN reports ongoing agitated delirium.  No acute events overnight.  12/16 - fent gtt titrated off. 60% fio3/peep 5+. Follows commands. Afebrile 12/17 - Febrile, vent settins weaned.  12/18 -On fent gtt, risperdan scheduled, nicotine patch, ativan prn and dialudid prn -> still Ongoing agitation itermittently.  CPT held. On 70% fio2 and peep 8 12/19 - 60% fio2, peep 8. Now on dilaudid gtt and precedex gtt with ativan prn, risperdal scheduled and nicotine patch. Still very agitated despite now dilaudid infuison and precedex. Trach changed to XLT 12/21- sedated on dilaudid and precedex. Will attempt an awakening trial. 12/23 remains supremely agitated at times despite dilaudid and dex infusions. .   Procedures  ETT 12/8 >>12/12  RUE PICC 12/9 >> Trach 12/12 >> changed to XLT 12/19 >>  Diagnostic tests:  DVT 11/29 > acute DVT involving the right common femoral vein, right femoral vein, right proximal profunda CT chest 12/10 > R>L PNA/consolidation left superior seg pulm nodules.  ECHO w/ bubble study 12/10 >> small right to left shunt.  Ejection fraction 40 to 45%, mild diffuse hypokinesis  Cultures:  Tracheal aspirate 12/2 >> proteus mirabilis >> pan sensitive                Haemophilus influenzae  Tracheal aspirate 12/8 >> neg BCx2 12/8 >> negative  RVP 11/29 >> rhinovirus   Antibiotics  Rocephin 12/2 >> 12/11  Interim history/subjective:   Agitation and inability to wean persisted over the weekend. Very combative with staff this morning. Palliative consulted over the weekend and is attempting to contact family. Cuff leak noted.   Objective   Blood pressure 104/64, pulse 96, temperature 99.4 F (37.4 C), temperature source Oral, resp. rate (!) 21, height 5\' 7"  (1.702 m), weight 80.7 kg, SpO2 91 %.    Vent Mode: PRVC FiO2 (%):  [70 %-100 %] 70 % Set Rate:  [  12 bmp] 12 bmp Vt Set:  [510 mL] 510 mL PEEP:  [8 cmH20] 8 cmH20 Plateau Pressure:  [15 cmH20-16 cmH20] 16 cmH20   Intake/Output Summary (Last 24 hours) at 04/01/2018 1018 Last data filed at 04/01/2018 1000 Gross per 24 hour  Intake 2440.76 ml  Output 2265 ml  Net 175.76 ml   Filed Weights   03/30/18 0401 03/31/18 0410 04/01/18 0453  Weight: 82.7 kg 81.8 kg 80.7 kg    General:  elderly appearing man on vent.  Neuro:   Sedated HEENT:  [place]/AT, No JVD noted, PERRL Cardiovascular:  RRR, no MRG Lungs:  Clear bilateral breath sounds.  Abdomen:  Soft, non-distended, non-tender.  Musculoskeletal:  No acute deformity or ROM limitation Skin:  Intact, MMM   LABS    CBC Recent Labs  Lab 03/30/18 0400 03/31/18 0411 04/01/18 0455  HGB 12.4* 12.5* 12.6*  HCT 41.0 41.0 41.1  WBC 9.3 9.5 9.6  PLT 387 400 371    CHEMISTRY Recent Labs  Lab 03/27/18 0432 03/28/18 0020 03/28/18 0417 03/29/18 0002 03/29/18 0508 03/30/18 0001 03/30/18 0400 03/31/18 0411 04/01/18 0455  NA 139 141  --  141  --  141  --   --  143  K 4.3 3.7  --  4.1  --  4.1  --   --  3.8  CL 96* 97*  --  94*  --  93*  --   --  95*  CO2 33* 33*  --  34*  --  36*  --   --  37*  GLUCOSE 111* 157*  --  156*  --  163*  --   --  163*  BUN 18 15  --  23*  --  25*  --   --  28*  CREATININE 0.56* 0.42*  --  0.58*  --  0.62  --   --  0.60*  CALCIUM 9.2 8.7*  --  9.1  --  9.2  --   --  9.2  MG 1.9  --  1.9  --  1.9  --  1.9 1.8 1.7  PHOS 3.6  --  4.2  --  4.8*  --  4.1 4.3 4.3   Estimated Creatinine Clearance: 99.9 mL/min (A) (by C-G formula based on SCr of 0.6 mg/dL (L)).  ENDOCRINE CBG (last 3)  Recent Labs    03/31/18 2354 04/01/18 0419 04/01/18 0800  GLUCAP 129* 130* 140*   IMAGING x48h  - image(s) personally visualized  -   highlighted in bold No results found.   Assessment & Plan: 24  Days LOS    Acute on chronic hypoxic Respiratory Failure s/p trach 03/21/18 -in setting of rhinovirus, proteus/H.influenza PNA, and likely AECOPD. There is also some concern for PE based on positive DVT. Also history of CHF with mild vascular congestion on CXR.  Bubble study also indicated a very small R>L shunt. Truthfully, its not clear that we fully understand why he continues to have a significant degree of hypoxia.  - Full vent support - PSV if tolerated. Limited by hypoxia and AMS - Wean FiO2 as tolerated - Continued diuresis.  - VAP  bundle - Will do CTA chest to evaluate hypoxia and evaluate for PE.   COPD NOS (but big barrel chest so likely severe) with chronic hypercarbic respiratory failure. - Duoneb QID  Pulmonary Nodules - Will need outpatient imaging in 3-6 months   Acute on Chronic Systolic Heart Failure-EF 35% : remains volume 7L pos on  I&O for admission, which is an improvement, however, CXR continues to appear as volume up.  - Scheduled lasix - start low dose ACE-I once BP able to tolerate.  Acute Encephalopathy / Agitated Delirium: Continues despite quite aggressive pain/sedation medication.  - Precedex infusion - Dilaudid infusion - RASS goal 0 - Increase risperidone to 1mg  BID. Please increase by 1mg  per day as indicated up to max 6mg  per day.  - Continue Klonopin  - Start low dose valproic acid.  - PRN Zyprexa for emergency use. - Wean nicotine from 21mg  to 14mg   - periodic QTc checks.  12/22 QTC 428  R Femoral DVT with suspected pulmonary embolism. - Continue LMWH - No change in platelet count.  Dysphagia.   P: TF per protocol  Will need PEG   Urinary retention 12/19 - did not tolerate being without foley and reinserted foley 03/28/18  P: foley management I/O  Hyperglycemia P: Monitor - normal off SSI. Glucose 111-163 over past 24 hours.   Best practice   Nutrition: TF DVT: therapeutic LMWH Glucose: n/a Sedation: PAD PUD: Pepcid  Family - family has not been available. Appreciate palliative care/social work ongoing efforts to contact.  Dispo  LTAC ineliglble due to insurance   My critical care time excluding procedures: 35 minutes   Joneen RoachPaul Hoffman, AGACNP-BC [facility] Pulmonary/Critical Care Pager [phone] or [phone]  04/01/2018 10:51 AM

## 2018-04-01 NOTE — Progress Notes (Signed)
Patient has 2 Nieces that have visited and may be best contact to support family discussions.  Marcelino Duster [phone] [place] [phone]

## 2018-04-01 NOTE — Progress Notes (Signed)
Patient transported to CT and back without complications. RN at bedside.  

## 2018-04-02 DIAGNOSIS — Z9911 Dependence on respirator [ventilator] status: Secondary | ICD-10-CM

## 2018-04-02 LAB — CBC WITH DIFFERENTIAL/PLATELET
ABS IMMATURE GRANULOCYTES: 0.06 10*3/uL (ref 0.00–0.07)
Basophils Absolute: 0 10*3/uL (ref 0.0–0.1)
Basophils Relative: 0 %
Eosinophils Absolute: 0.3 10*3/uL (ref 0.0–0.5)
Eosinophils Relative: 4 %
HCT: 33.1 % — ABNORMAL LOW (ref 39.0–52.0)
Hemoglobin: 9.8 g/dL — ABNORMAL LOW (ref 13.0–17.0)
Immature Granulocytes: 1 %
Lymphocytes Relative: 21 %
Lymphs Abs: 1.4 10*3/uL (ref 0.7–4.0)
MCH: 30.2 pg (ref 26.0–34.0)
MCHC: 29.6 g/dL — AB (ref 30.0–36.0)
MCV: 102.2 fL — ABNORMAL HIGH (ref 80.0–100.0)
MONO ABS: 0.7 10*3/uL (ref 0.1–1.0)
Monocytes Relative: 10 %
Neutro Abs: 4.5 10*3/uL (ref 1.7–7.7)
Neutrophils Relative %: 64 %
Platelets: 264 10*3/uL (ref 150–400)
RBC: 3.24 MIL/uL — ABNORMAL LOW (ref 4.22–5.81)
RDW: 13.1 % (ref 11.5–15.5)
WBC: 6.9 10*3/uL (ref 4.0–10.5)
nRBC: 0 % (ref 0.0–0.2)

## 2018-04-02 LAB — GLUCOSE, CAPILLARY
GLUCOSE-CAPILLARY: 124 mg/dL — AB (ref 70–99)
Glucose-Capillary: 124 mg/dL — ABNORMAL HIGH (ref 70–99)
Glucose-Capillary: 127 mg/dL — ABNORMAL HIGH (ref 70–99)
Glucose-Capillary: 130 mg/dL — ABNORMAL HIGH (ref 70–99)
Glucose-Capillary: 130 mg/dL — ABNORMAL HIGH (ref 70–99)
Glucose-Capillary: 132 mg/dL — ABNORMAL HIGH (ref 70–99)

## 2018-04-02 LAB — AMMONIA: Ammonia: 29 umol/L (ref 9–35)

## 2018-04-02 LAB — BASIC METABOLIC PANEL
ANION GAP: 9 (ref 5–15)
BUN: 27 mg/dL — ABNORMAL HIGH (ref 6–20)
CALCIUM: 8.8 mg/dL — AB (ref 8.9–10.3)
CO2: 34 mmol/L — ABNORMAL HIGH (ref 22–32)
Chloride: 98 mmol/L (ref 98–111)
Creatinine, Ser: 0.57 mg/dL — ABNORMAL LOW (ref 0.61–1.24)
GFR calc Af Amer: >60 mL/min (ref >60)
GFR calc non Af Amer: >60 mL/min (ref >60)
Glucose, Bld: 132 mg/dL — ABNORMAL HIGH (ref 70–99)
Potassium: 3.3 mmol/L — ABNORMAL LOW (ref 3.5–5.1)
Sodium: 141 mmol/L (ref 135–145)

## 2018-04-02 LAB — PHOSPHORUS: Phosphorus: 3.6 mg/dL (ref 2.5–4.6)

## 2018-04-02 LAB — HEPATIC FUNCTION PANEL
ALT: 44 U/L (ref 0–44)
AST: 35 U/L (ref 15–41)
Albumin: 2.5 g/dL — ABNORMAL LOW (ref 3.5–5.0)
Alkaline Phosphatase: 53 U/L (ref 38–126)
Bilirubin, Direct: 0.1 mg/dL (ref 0.0–0.2)
Indirect Bilirubin: 0.5 mg/dL (ref 0.3–0.9)
Total Bilirubin: 0.6 mg/dL (ref 0.3–1.2)
Total Protein: 6.8 g/dL (ref 6.5–8.1)

## 2018-04-02 LAB — MAGNESIUM: Magnesium: 2 mg/dL (ref 1.7–2.4)

## 2018-04-02 MED ORDER — FREE WATER
100.0000 mL | Freq: Three times a day (TID) | Status: DC
  Administered 2018-04-02 – 2018-04-06 (×13): 100 mL

## 2018-04-02 MED ORDER — HYDROMORPHONE HCL 2 MG PO TABS
2.0000 mg | ORAL_TABLET | ORAL | Status: DC
  Administered 2018-04-02 – 2018-04-06 (×25): 2 mg via ORAL
  Filled 2018-04-02 (×25): qty 1

## 2018-04-02 MED ORDER — CLONAZEPAM 0.5 MG PO TBDP
1.0000 mg | ORAL_TABLET | Freq: Four times a day (QID) | ORAL | Status: DC
  Administered 2018-04-02 – 2018-04-06 (×15): 1 mg
  Filled 2018-04-02 (×4): qty 2
  Filled 2018-04-02: qty 4
  Filled 2018-04-02 (×3): qty 2
  Filled 2018-04-02: qty 4
  Filled 2018-04-02 (×4): qty 2
  Filled 2018-04-02: qty 4
  Filled 2018-04-02 (×2): qty 2

## 2018-04-02 MED ORDER — POTASSIUM CHLORIDE 20 MEQ/15ML (10%) PO SOLN
40.0000 meq | ORAL | Status: AC
  Administered 2018-04-02 (×2): 40 meq via ORAL
  Filled 2018-04-02 (×2): qty 30

## 2018-04-02 MED ORDER — VALPROATE SODIUM 500 MG/5ML IV SOLN
500.0000 mg | Freq: Three times a day (TID) | INTRAVENOUS | Status: DC
  Administered 2018-04-02 – 2018-04-06 (×12): 500 mg via INTRAVENOUS
  Filled 2018-04-02 (×14): qty 5

## 2018-04-02 MED ORDER — LORAZEPAM 2 MG/ML IJ SOLN
1.0000 mg | INTRAMUSCULAR | Status: DC | PRN
  Administered 2018-04-02 – 2018-04-06 (×5): 2 mg via INTRAVENOUS
  Filled 2018-04-02 (×5): qty 1

## 2018-04-02 MED ORDER — SENNOSIDES 8.8 MG/5ML PO SYRP
5.0000 mL | ORAL_SOLUTION | Freq: Two times a day (BID) | ORAL | Status: DC
  Administered 2018-04-02 – 2018-04-06 (×9): 5 mL via ORAL
  Filled 2018-04-02 (×9): qty 5

## 2018-04-02 NOTE — Progress Notes (Signed)
Chest pt not done due to pt being agitated.

## 2018-04-02 NOTE — Plan of Care (Signed)
  Problem: Clinical Measurements: Goal: Will remain free from infection Outcome: Progressing   Problem: Activity: Goal: Risk for activity intolerance will decrease Outcome: Progressing   Problem: Nutrition: Goal: Adequate nutrition will be maintained Outcome: Progressing   Problem: Elimination: Goal: Will not experience complications related to bowel motility Outcome: Progressing Goal: Will not experience complications related to urinary retention Outcome: Progressing   Problem: Skin Integrity: Goal: Risk for impaired skin integrity will decrease Outcome: Progressing

## 2018-04-02 NOTE — Progress Notes (Signed)
NAME:  Brian [name], MRN:  [id number], DOB:  [birth year], LOS: 23 ADMISSION DATE:  03/06/2018, CONSULTATION DATE:  02/22/2018 REFERRING MD:  Lamont Snowball - ED, CHIEF COMPLAINT:  Acute respiratory failure.   Brief Summary:  62 year old man who was intubated and transferred from [facility] for hypercapneic respiratory failure in the setting of exacerbation of COPD, Rhinovirus, proteus mirabilis / H. Influenzae pneumonia. New DVT on anticoagulation. Extubated but required reintubation for hypoxia.  Completed appropriate antibiotics.  Course complicated by ETOH withdrawal and agitated delirium.  No evidence of shunt on TEE.  S/P trach, cortrak.   Social hx elicited 03/28/18 - per RN - homeless, drug abuser, alcoholic  Past Medical History  ETOH Abuse Cardiomyopathy  Cocaine abuse  COPD (likely severe based on huge barrel chest) Homelessness  Medical non-compliance  Paroxysmal VT Suicide attempt  Tobacco Abuse  DVT   Significant Events:  11/29  Admit with hypercarbic resp fx in setting of PNA + AECOPD.  DVT+ 12/05 Precedex   12/07 HFNC 15L, interactive on precedex 12/08 Re-intubated for hypoxia (? atx vs PNA) 12/09 High peep/fio2. Slowly weaning 12/10 CT chest and bubble study as FIO2 and PEEP requirements seem out of proportion to CXR findings.  12/11 Right to left shunt (small via TTE). CT chest w/ bibasilar PNA; TEE ordered. Still not weaning much. TEE was neg for sig shunt. ABX stopped.  12/12 Trach placed. CXR stable. Still requiring high FIO2 and PEEP. Trach aspirate with proteus and H flu 12/13 Cortrack.  12/15 - Tmax 100.2.  I/O- 1L positive in last 24 hours, 10L positive for admit.  RN reports ongoing agitated delirium.  No acute events overnight.  12/16 - fent gtt titrated off. 60% fio3/peep 5+. Follows commands. Afebrile 12/17 - Febrile, vent settins weaned.  12/18 -On fent gtt, risperdan scheduled, nicotine patch, ativan prn and dialudid prn -> still Ongoing agitation  itermittently. CPT held. On 70% fio2 and peep 8 12/19 - 60% fio2, peep 8. Now on dilaudid gtt and precedex gtt with ativan prn, risperdal scheduled and nicotine patch. Still very agitated despite now dilaudid infuison and precedex. Trach changed to XLT 12/21- sedated on dilaudid and precedex. Will attempt an awakening trial. 12/24 remains supremely agitated at times despite dilaudid and dex infusions. Riseperdal and Depakote.   Procedures  ETT 12/8 >>12/12  RUE PICC 12/9 >> Trach 12/12 >> changed to XLT 12/19 >>  Diagnostic tests:  DVT 11/29 > acute DVT involving the right common femoral vein, right femoral vein, right proximal profunda CT chest 12/10 > R>L PNA/consolidation left superior seg pulm nodules.  ECHO w/ bubble study 12/10 >> small right to left shunt.  Ejection fraction 40 to 45%, mild diffuse hypokinesis CTA chest 12/24 > No evidence PE, Bilateral effusion with persistent bilateral lower lobe consolidation. Persistent ATX in RML and lingula. Interval development of multifocal bilateral pulmonary nodules which are either ground-glass or part solid in appearance. Part solid nodule in the right upper lobe may have a central cavitary component.  Cultures:  Tracheal aspirate 12/2 >> proteus mirabilis >> pan sensitive                Haemophilus influenzae  Tracheal aspirate 12/8 >> neg BCx2 12/8 >> negative  RVP 11/29 >> rhinovirus   Antibiotics  Rocephin 12/2 >> 12/11  Interim history/subjective:   Continues to be agitated despite medication changes made yesterday.    Objective   Blood pressure 95/66, pulse (!) 51, temperature 98.6 F (37  C), temperature source Oral, resp. rate 18, height 5\' 7"  (1.702 m), weight 82.1 kg, SpO2 97 %.    Vent Mode: PRVC FiO2 (%):  [60 %-70 %] 60 % Set Rate:  [12 bmp-18 bmp] 18 bmp Vt Set:  [510 mL] 510 mL PEEP:  [8 cmH20] 8 cmH20 Plateau Pressure:  [15 cmH20-17 cmH20] 16 cmH20   Intake/Output Summary (Last 24 hours) at 04/02/2018  0831 Last data filed at 04/02/2018 0700 Gross per 24 hour  Intake 2762.09 ml  Output 2435 ml  Net 327.09 ml   Filed Weights   03/31/18 0410 04/01/18 0453 04/02/18 0500  Weight: 81.8 kg 80.7 kg 82.1 kg    General:  elderly appearing man on vent.  Neuro:  Agitated, trying to climb out of bed this morning.  HEENT:  [place]/AT, No JVD noted, PERRL Cardiovascular:  RRR, no MRG Lungs:  Clear bilateral  Abdomen:  Soft, non-distended, non-tender.  Musculoskeletal:  No acute deformity or ROM limitation. Skin:  Intact, MMM   LABS    CBC Recent Labs  Lab 03/31/18 0411 04/01/18 0455 04/02/18 0417  HGB 12.5* 12.6* 9.8*  HCT 41.0 41.1 33.1*  WBC 9.5 9.6 6.9  PLT 400 371 264    CHEMISTRY Recent Labs  Lab 03/28/18 0020  03/29/18 0002 03/29/18 0508 03/30/18 0001 03/30/18 0400 03/31/18 0411 04/01/18 0455 04/02/18 0555  NA 141  --  141  --  141  --   --  143 141  K 3.7  --  4.1  --  4.1  --   --  3.8 3.3*  CL 97*  --  94*  --  93*  --   --  95* 98  CO2 33*  --  34*  --  36*  --   --  37* 34*  GLUCOSE 157*  --  156*  --  163*  --   --  163* 132*  BUN 15  --  23*  --  25*  --   --  28* 27*  CREATININE 0.42*  --  0.58*  --  0.62  --   --  0.60* 0.57*  CALCIUM 8.7*  --  9.1  --  9.2  --   --  9.2 8.8*  MG  --    < >  --  1.9  --  1.9 1.8 1.7 2.0  PHOS  --    < >  --  4.8*  --  4.1 4.3 4.3 3.6   < > = values in this interval not displayed.   Estimated Creatinine Clearance: 100.7 mL/min (A) (by C-G formula based on SCr of 0.57 mg/dL (L)).  ENDOCRINE CBG (last 3)  Recent Labs    04/01/18 2359 04/02/18 0355 04/02/18 0758  GLUCAP 127* 130* 124*   IMAGING x48h  - image(s) personally visualized  -   highlighted in bold Ct Angio Chest Pe W Or Wo Contrast  Result Date: 04/01/2018 CLINICAL DATA:  Dyspnea.  Evaluate for pulmonary embolus. EXAM: CT ANGIOGRAPHY CHEST WITH CONTRAST TECHNIQUE: Multidetector CT imaging of the chest was performed using the standard protocol during bolus  administration of intravenous contrast. Multiplanar CT image reconstructions and MIPs were obtained to evaluate the vascular anatomy. CONTRAST:  25mL ISOVUE-370 IOPAMIDOL (ISOVUE-370) INJECTION 76% COMPARISON:  03/19/2018 FINDINGS: Cardiovascular: Mild cardiac enlargement. The main pulmonary artery appears patent. There is no saddle embolus. No lobar or segmental pulmonary artery filling defects identified. No pericardial effusion. Aortic atherosclerosis noted. There are calcifications in the  left main coronary artery, LAD, left circumflex and RCA. Mediastinum/Nodes: Tracheostomy tube tip is above the carina. Normal appearance of the thyroid gland. Enteric tube is in place with tip post pyloric. Lungs/Pleura: Mild to moderate changes of emphysema. There are small to moderate bilateral pleural effusions. Bilateral lower lobe airspace consolidation and atelectasis noted. Right middle lobe and lingular atelectasis is also identified. Multifocal bilateral sub solid nodules are identified in both lungs. For example, -Index nodule within the right upper lobe is new measuring 1.4 cm and may have a central cavitary component. -Also new is a ground-glass nodule in the right upper lobe measuring 1.5 cm, image 64/8. -Posterior right upper lobe solid and ground-glass attenuating nodule is new measuring 1.7 cm, image 74/8. -Pure ground-glass attenuating nodule in the right upper lobe measures 1.4 cm, image 103/8. Upper Abdomen: Small volume of ascites noted within the left upper quadrant of the abdomen. Musculoskeletal: Spondylosis within the thoracic spine. Review of the MIP images confirms the above findings. IMPRESSION: 1. No evidence for acute pulmonary embolus. 2. Bilateral pleural effusions with persistent bilateral lower lobe airspace consolidation and atelectasis compatible with multifocal pneumonia. There is persistent subsegmental atelectasis in the right middle lobe and lingula. 3. Interval development of multifocal  bilateral pulmonary nodules which are either ground-glass or part solid in appearance. Part solid nodule in the right upper lobe may have a central cavitary component. Differential considerations include inflammatory or infectious etiologies. Atypical infection and septic emboli not excluded. 4.  Aortic Atherosclerosis (ICD10-I70.0). 5. Multi vessel coronary artery atherosclerotic calcifications including left main disease. Electronically Signed   By: Signa Kell M.D.   On: 04/01/2018 16:55     Assessment & Plan: 25  Days LOS    Acute on chronic hypoxic Respiratory Failure s/p trach 03/21/18 -in setting of rhinovirus, proteus/H.influenza PNA, and likely AECOPD. There is also some concern for PE based on positive DVT. Also history of CHF with mild vascular congestion on CXR.  Bubble study also indicated a very small R>L shunt. Truthfully, its not clear that we fully understand why he continues to have a significant degree of hypoxia.  - Full vent support - PSV if tolerated. Limited by hypoxia and AMS - Wean FiO2 as tolerated - Continued diuresis. Will give K - VAP bundle  COPD NOS (but big barrel chest so likely severe) with chronic hypercarbic respiratory failure. - Duoneb QID  Pulmonary Nodules - Will need outpatient imaging in 3-6 months   Acute on Chronic Systolic Heart Failure-EF 35% : remains volume 6.5L pos on I&O for admission, which is an improvement, however, CXR continues to appear as volume up.  - Continue scheduled lasix - start low dose ACE-I once BP able to tolerate.  Acute Encephalopathy / Agitated Delirium: Continues despite quite aggressive pain/sedation medication.  - Precedex infusion - Dilaudid infusion - RASS goal 0 - Increase risperidone to 1mg  BID. Please increase by 1mg  per day as indicated up to max 6mg  per day. Will hold off today due to QTC borderline high at 500 today.  - Continue Klonopin  - Increase dose valproic acid.  - PRN Zyprexa for emergency use  assuming QTC safe - Wean nicotine from 14mg   - Follow QTC on monitor  R Femoral DVT with suspected pulmonary embolism. - Continue LMWH - No change in platelet count - HGB drop today, will monitor  Dysphagia.   P: TF per protocol  Add free water Will need PEG after holiday  Urinary retention P: foley management  I/O  Hyperglycemia P: Monitor - normal off SSI. Glucose 111-163 over past 24 hours.   Best practice   Nutrition: TF DVT: therapeutic LMWH Glucose: n/a Sedation: PAD PUD: Pepcid  Family - family has not been available. Appreciate palliative care/social work ongoing efforts to contact.  Dispo  LTAC ineliglble due to insurance   My critical care time excluding procedures: 35 minutes   Joneen RoachPaul Marcelo Ickes, AGACNP-BC [facility] Pager [phone] or [phone]  04/02/2018 8:31 AM

## 2018-04-02 NOTE — Progress Notes (Signed)
Palliative:  I met at Brian [name] bedside but no family at bedside. He is very agitated and RN is at bedside working with him. Unfortunately he has not had fairly consistent relief with current medications and continues to become very agitated and anxious with little activity and at random times per nursing staff.   I called and left message for Brian [name] (niece?). I called and was able to speak with Brian [name] (niece). Brian [name] was noted by previous palliative consult note this summer as a niece he has kept up with and even lived with at times. Brian [name] tells me that Brian [name] is her mother and Brian [name] is her father (the only other listed contacts). Brian [name] tells me that she tries to look out for Brian [name].   I explained to Brian [name] that he has unfortunately not had much improvement and has continued to be severely agitated. Brian [name] becomes tearful and tells me that she wonders if she made the right decision for trach and expresses that she does not want him to continue to suffer. I explained that this is precisely what we should sit down and discuss. We planned to meet together with palliative care (along with her father Brian [name]) on Friday 04/05/18 at 3pm. She will be prepared to make some difficult decisions. In the mean time we will continue to attempt to better manage his agitation.   Plan: - Increase clonazepam from 0.5 mg to 1 mg per tube every 6 hours.  - Increase ativan dosage to 1-2 mg IV every 4 hours prn.  - Palliative meeting with family 04/05/18 3pm to consider transition to comfort care.   Discussed plan with RN and medication changes with Dr. Rowe Pavy.   25 min  Vinie Sill, NP Palliative Medicine Team Pager # [phone] (M-F 8a-5p) Team Phone # [phone] (Nights/Weekends)

## 2018-04-03 ENCOUNTER — Inpatient Hospital Stay (HOSPITAL_COMMUNITY): Payer: Medicaid Other

## 2018-04-03 DIAGNOSIS — Z9911 Dependence on respirator [ventilator] status: Secondary | ICD-10-CM

## 2018-04-03 DIAGNOSIS — R451 Restlessness and agitation: Secondary | ICD-10-CM

## 2018-04-03 LAB — MAGNESIUM: Magnesium: 1.9 mg/dL (ref 1.7–2.4)

## 2018-04-03 LAB — CBC
HCT: 40.2 % (ref 39.0–52.0)
HEMOGLOBIN: 11.7 g/dL — AB (ref 13.0–17.0)
MCH: 29.5 pg (ref 26.0–34.0)
MCHC: 29.1 g/dL — ABNORMAL LOW (ref 30.0–36.0)
MCV: 101.3 fL — ABNORMAL HIGH (ref 80.0–100.0)
Platelets: 346 10*3/uL (ref 150–400)
RBC: 3.97 MIL/uL — ABNORMAL LOW (ref 4.22–5.81)
RDW: 13.2 % (ref 11.5–15.5)
WBC: 7.6 10*3/uL (ref 4.0–10.5)
nRBC: 0 % (ref 0.0–0.2)

## 2018-04-03 LAB — BASIC METABOLIC PANEL
Anion gap: 9 (ref 5–15)
BUN: 30 mg/dL — ABNORMAL HIGH (ref 6–20)
CO2: 33 mmol/L — ABNORMAL HIGH (ref 22–32)
Calcium: 9.1 mg/dL (ref 8.9–10.3)
Chloride: 102 mmol/L (ref 98–111)
Creatinine, Ser: 0.62 mg/dL (ref 0.61–1.24)
GFR calc Af Amer: >60 mL/min (ref >60)
Glucose, Bld: 195 mg/dL — ABNORMAL HIGH (ref 70–99)
POTASSIUM: 3.6 mmol/L (ref 3.5–5.1)
Sodium: 144 mmol/L (ref 135–145)

## 2018-04-03 LAB — GLUCOSE, CAPILLARY
Glucose-Capillary: 101 mg/dL — ABNORMAL HIGH (ref 70–99)
Glucose-Capillary: 107 mg/dL — ABNORMAL HIGH (ref 70–99)
Glucose-Capillary: 110 mg/dL — ABNORMAL HIGH (ref 70–99)
Glucose-Capillary: 114 mg/dL — ABNORMAL HIGH (ref 70–99)
Glucose-Capillary: 127 mg/dL — ABNORMAL HIGH (ref 70–99)
Glucose-Capillary: 128 mg/dL — ABNORMAL HIGH (ref 70–99)

## 2018-04-03 LAB — VALPROIC ACID LEVEL: Valproic Acid Lvl: 40 ug/mL — ABNORMAL LOW (ref 50.0–100.0)

## 2018-04-03 MED ORDER — POTASSIUM CHLORIDE 20 MEQ/15ML (10%) PO SOLN: 20.0000 meq | Freq: Once | ORAL | Status: DC

## 2018-04-03 MED ORDER — HALOPERIDOL LACTATE 5 MG/ML IJ SOLN: 1.0000 mg | Freq: Four times a day (QID) | INTRAMUSCULAR | Status: DC | PRN

## 2018-04-03 MED ORDER — FUROSEMIDE 10 MG/ML IJ SOLN
60.0000 mg | Freq: Three times a day (TID) | INTRAMUSCULAR | Status: DC
  Administered 2018-04-03 – 2018-04-06 (×9): 60 mg via INTRAVENOUS
  Filled 2018-04-03 (×9): qty 6

## 2018-04-03 MED ORDER — POTASSIUM CHLORIDE 20 MEQ/15ML (10%) PO SOLN
20.0000 meq | Freq: Two times a day (BID) | ORAL | Status: DC
  Administered 2018-04-03 – 2018-04-06 (×7): 20 meq via ORAL
  Filled 2018-04-03 (×7): qty 15

## 2018-04-03 MED ORDER — POTASSIUM CHLORIDE 10 MEQ/100ML IV SOLN
10.0000 meq | INTRAVENOUS | Status: AC
  Administered 2018-04-03 (×2): 10 meq via INTRAVENOUS
  Filled 2018-04-03 (×2): qty 100

## 2018-04-03 MED ORDER — HALOPERIDOL LACTATE 5 MG/ML IJ SOLN
1.0000 mg | Freq: Once | INTRAMUSCULAR | Status: DC
  Filled 2018-04-03: qty 1

## 2018-04-03 NOTE — Progress Notes (Signed)
Bladder scan @0000  showed . Condom cath still on pt w/ no output. Elink called to verify in&out cath as best option since pt has been retaining urine for >15hrs. Spoke with elink RN. Waiting to hear back from elink MD. Will continue to monitor pt closely.

## 2018-04-03 NOTE — Progress Notes (Signed)
@  2210 pt sustained a 17 beat run of VTach. Pt asleep & resting. Strip printed & placed in chart. Elink notified & waiting orders.

## 2018-04-03 NOTE — Progress Notes (Signed)
  Fio2 droped from 100% to 80%, pt tolerating well. sp02 97%

## 2018-04-03 NOTE — Progress Notes (Signed)
Palliative:  I checked in with Brian [name] who does not appear agitated. RN reports that he has been calm for her so far this morning and she is trying to not agitate him. Ativan was changed to haldol which seems to work better for him. No changes to regimen currently. Still requiring 70% FiO2 on vent. Prognosis remains very poor given high needs of medications to keep him calm and lack of improvement in respiratory status. COPD likely much more severe than originally anticipated.   Meeting is scheduled with family for Friday. Niece, Brian [name], seemed prepared to discuss possible transition to comfort on Friday as she is upset that he continues to suffer without any progress or improvement.   No changes today.   Exam: Resting on vent. VSS. No distress.   15 min  Yong Channel, NP Palliative Medicine Team Pager # [phone] (M-F 8a-5p) Team Phone # [phone] (Nights/Weekends)

## 2018-04-03 NOTE — Progress Notes (Signed)
eLink Physician-Brief Progress Note Patient Name: EVENS SODERBERG DOB: Aug 01, 1955 MRN: [id number]   Date of Service  04/03/2018  HPI/Events of Note    eICU Interventions  Agitation on precedex at 1.2, dialudid gtt.  Alcohol withdrawal. K at 3.6, qtc 460.  Plan: - Kcl 10 meq q1 hr x 2 doses - Haldol 1 mg IV once.  Watch for torsades. Mag level ok.      Intervention Category Minor Interventions: Electrolytes abnormality - evaluation and management;Agitation / anxiety - evaluation and management  Ranee Gosselin 04/03/2018, 6:18 AM

## 2018-04-03 NOTE — Progress Notes (Addendum)
NAME:  Brian [name], MRN:  [id number], DOB:  24-Jul-1955, LOS: 23 ADMISSION DATE:  Mar 14, 2018, CONSULTATION DATE:  14-Mar-2018 REFERRING MD:  Lamont Snowball - ED, CHIEF COMPLAINT:  Acute respiratory failure.   Brief Summary:  62 year old man who was intubated and transferred from [facility] for hypercapneic respiratory failure in the setting of exacerbation of COPD, Rhinovirus, proteus mirabilis / H. Influenzae pneumonia. New DVT on anticoagulation. Extubated but required reintubation for hypoxia.  Completed appropriate antibiotics.  Course complicated by ETOH withdrawal and agitated delirium.  No evidence of shunt on TEE.  S/P trach, cortrak.   Social hx elicited 03/28/18 - per RN - homeless, drug abuser, alcoholic  Past Medical History  ETOH Abuse Cardiomyopathy  Cocaine abuse  COPD (likely severe based on huge barrel chest) Homelessness  Medical non-compliance  Paroxysmal VT Suicide attempt  Tobacco Abuse  DVT   Significant Events:  11/29  Admit with hypercarbic resp fx in setting of PNA + AECOPD.  DVT+ 12/05 Precedex   12/07 HFNC 15L, interactive on precedex 12/08 Re-intubated for hypoxia (? atx vs PNA) 12/09 High peep/fio2. Slowly weaning 12/10 CT chest and bubble study as FIO2 and PEEP requirements seem out of proportion to CXR findings.  12/11 Right to left shunt (small via TTE). CT chest w/ bibasilar PNA; TEE ordered. Still not weaning much. TEE was neg for sig shunt. ABX stopped.  12/12 Trach placed. CXR stable. Still requiring high FIO2 and PEEP. Trach aspirate with proteus and H flu 12/13 Cortrack.  12/15 - Tmax 100.2.  I/O- 1L positive in last 24 hours, 10L positive for admit.  RN reports ongoing agitated delirium.  No acute events overnight.  12/16 - fent gtt titrated off. 60% fio3/peep 5+. Follows commands. Afebrile 12/17 - Febrile, vent settins weaned.  12/18 -On fent gtt, risperdan scheduled, nicotine patch, ativan prn and dialudid prn -> still Ongoing agitation  itermittently. CPT held. On 70% fio2 and peep 8 12/19 - 60% fio2, peep 8. Now on dilaudid gtt and precedex gtt with ativan prn, risperdal scheduled and nicotine patch. Still very agitated despite now dilaudid infuison and precedex. Trach changed to XLT 12/21- sedated on dilaudid and precedex. Will attempt an awakening trial. 12/24 remains supremely agitated at times despite dilaudid and dex infusions. Riseperdal and Depakote.  04/03/2018 not well sedated.  Procedures  ETT 12/8 >>12/12  RUE PICC 12/9 >> Trach 12/12 >> changed to XLT 12/19 >>  Diagnostic tests:  DVT 11/29 > acute DVT involving the right common femoral vein, right femoral vein, right proximal profunda CT chest 12/10 > R>L PNA/consolidation left superior seg pulm nodules.  ECHO w/ bubble study 12/10 >> small right to left shunt.  Ejection fraction 40 to 45%, mild diffuse hypokinesis CTA chest 12/24 > No evidence PE, Bilateral effusion with persistent bilateral lower lobe consolidation. Persistent ATX in RML and lingula. Interval development of multifocal bilateral pulmonary nodules which are either ground-glass or part solid in appearance. Part solid nodule in the right upper lobe may have a central cavitary component.  Cultures:  Tracheal aspirate 12/2 >> proteus mirabilis >> pan sensitive                Haemophilus influenzae  Tracheal aspirate 12/8 >> neg BCx2 12/8 >> negative  RVP 11/29 >> rhinovirus   Antibiotics  Rocephin 12/2 >> 12/11  Interim history/subjective:   Currently well sedated  Objective   Blood pressure 117/69, pulse (!) 56, temperature 99.6 F (37.6 C), temperature source Oral,  resp. rate 18, height 5\' 7"  (1.702 m), weight 83 kg, SpO2 98 %.    Vent Mode: PRVC FiO2 (%):  [60 %-100 %] 70 % Set Rate:  [18 bmp] 18 bmp Vt Set:  [510 mL] 510 mL PEEP:  [8 cmH20] 8 cmH20 Plateau Pressure:  [15 cmH20-18 cmH20] 16 cmH20   Intake/Output Summary (Last 24 hours) at 04/03/2018 0858 Last data filed at  04/03/2018 0800 Gross per 24 hour  Intake 2070.46 ml  Output 1725 ml  Net 345.46 ml   Filed Weights   04/01/18 0453 04/02/18 0500 04/03/18 0305  Weight: 80.7 kg 82.1 kg 83 kg    General: Elderly disheveled male who is well sedated currently HEENT: Ileostomy in place Neuro: Currently heavily sedated when not sedated is agitated and thrashing in the bed CV: Sounds are distant PULM: even/non-labored, lungs bilaterally managed throughout RU:EAVW, non-tender, bsx4 active  Extremities: warm/dry, negative edema  Skin: no rashes or lesions    LABS    CBC Recent Labs  Lab 04/01/18 0455 04/02/18 0417 04/03/18 0516  HGB 12.6* 9.8* 11.7*  HCT 41.1 33.1* 40.2  WBC 9.6 6.9 7.6  PLT 371 264 346    CHEMISTRY Recent Labs  Lab 03/29/18 0002 03/29/18 0508 03/30/18 0001 03/30/18 0400 03/31/18 0411 04/01/18 0455 04/02/18 0555 04/03/18 0516  NA 141  --  141  --   --  143 141 144  K 4.1  --  4.1  --   --  3.8 3.3* 3.6  CL 94*  --  93*  --   --  95* 98 102  CO2 34*  --  36*  --   --  37* 34* 33*  GLUCOSE 156*  --  163*  --   --  163* 132* 195*  BUN 23*  --  25*  --   --  28* 27* 30*  CREATININE 0.58*  --  0.62  --   --  0.60* 0.57* 0.62  CALCIUM 9.1  --  9.2  --   --  9.2 8.8* 9.1  MG  --  1.9  --  1.9 1.8 1.7 2.0 1.9  PHOS  --  4.8*  --  4.1 4.3 4.3 3.6  --    Estimated Creatinine Clearance: 101.3 mL/min (by C-G formula based on SCr of 0.62 mg/dL).  ENDOCRINE CBG (last 3)  Recent Labs    04/03/18 0012 04/03/18 0351 04/03/18 0753  GLUCAP 128* 127* 101*   IMAGING x48h  - image(s) personally visualized  -   highlighted in bold Ct Angio Chest Pe W Or Wo Contrast  Result Date: 04/01/2018 CLINICAL DATA:  Dyspnea.  Evaluate for pulmonary embolus. EXAM: CT ANGIOGRAPHY CHEST WITH CONTRAST TECHNIQUE: Multidetector CT imaging of the chest was performed using the standard protocol during bolus administration of intravenous contrast. Multiplanar CT image reconstructions and MIPs  were obtained to evaluate the vascular anatomy. CONTRAST:  60mL ISOVUE-370 IOPAMIDOL (ISOVUE-370) INJECTION 76% COMPARISON:  03/19/2018 FINDINGS: Cardiovascular: Mild cardiac enlargement. The main pulmonary artery appears patent. There is no saddle embolus. No lobar or segmental pulmonary artery filling defects identified. No pericardial effusion. Aortic atherosclerosis noted. There are calcifications in the left main coronary artery, LAD, left circumflex and RCA. Mediastinum/Nodes: Tracheostomy tube tip is above the carina. Normal appearance of the thyroid gland. Enteric tube is in place with tip post pyloric. Lungs/Pleura: Mild to moderate changes of emphysema. There are small to moderate bilateral pleural effusions. Bilateral lower lobe airspace consolidation and atelectasis noted.  Right middle lobe and lingular atelectasis is also identified. Multifocal bilateral sub solid nodules are identified in both lungs. For example, -Index nodule within the right upper lobe is new measuring 1.4 cm and may have a central cavitary component. -Also new is a ground-glass nodule in the right upper lobe measuring 1.5 cm, image 64/8. -Posterior right upper lobe solid and ground-glass attenuating nodule is new measuring 1.7 cm, image 74/8. -Pure ground-glass attenuating nodule in the right upper lobe measures 1.4 cm, image 103/8. Upper Abdomen: Small volume of ascites noted within the left upper quadrant of the abdomen. Musculoskeletal: Spondylosis within the thoracic spine. Review of the MIP images confirms the above findings. IMPRESSION: 1. No evidence for acute pulmonary embolus. 2. Bilateral pleural effusions with persistent bilateral lower lobe airspace consolidation and atelectasis compatible with multifocal pneumonia. There is persistent subsegmental atelectasis in the right middle lobe and lingula. 3. Interval development of multifocal bilateral pulmonary nodules which are either ground-glass or part solid in appearance.  Part solid nodule in the right upper lobe may have a central cavitary component. Differential considerations include inflammatory or infectious etiologies. Atypical infection and septic emboli not excluded. 4.  Aortic Atherosclerosis (ICD10-I70.0). 5. Multi vessel coronary artery atherosclerotic calcifications including left main disease. Electronically Signed   By: Signa Kell M.D.   On: 04/01/2018 16:55   Dg Chest Port 1 View  Result Date: 04/03/2018 CLINICAL DATA:  Hypoxia. COPD and CHF. EXAM: PORTABLE CHEST 1 VIEW COMPARISON:  03/29/2018 in the CT of 04/01/2018. FINDINGS: Tracheostomy appropriately position. Feeding tube extends beyond the inferior aspect of the film. Right-sided PICC line tip at low SVC or cavoatrial junction. Cardiomegaly accentuated by AP portable technique. Atherosclerosis in the transverse aorta. Small bilateral pleural effusions are similar. No pneumothorax. Mild pulmonary venous congestion. Minimal improvement in right and similar left base airspace disease. IMPRESSION: 1. Minimal improvement in right and similar left base airspace disease. 2. Cardiomegaly with mild pulmonary venous congestion and small bilateral pleural effusions. Electronically Signed   By: Jeronimo Greaves M.D.   On: 04/03/2018 08:53     Assessment & Plan: 26  Days LOS    Acute on chronic hypoxic Respiratory Failure s/p trach 03/21/18 -in setting of rhinovirus, proteus/H.influenza PNA, and likely AECOPD. There is also some concern for PE based on positive DVT. Also history of CHF with mild vascular congestion on CXR.  Bubble study also indicated a very small R>L shunt. Truthfully, its not clear that we fully understand why he continues to have a significant degree of hypoxia.  -Currently on 70 % FiO2 - Wean FiO2 as tolerated - Continued diuresis.  - VAP bundle  COPD NOS (but big barrel chest so likely severe) with chronic hypercarbic respiratory failure. -Bronchodilators  Pulmonary Nodules - Will  need outpatient imaging in 3-6 months   Acute on Chronic Systolic Heart Failure-EF 35% : remains volume 6.5L pos on I&O for admission, which is an improvement, however, CXR continues to appear as volume up.  -Diuresis as tolerated -ACE inhibitor in future  Acute Encephalopathy / Agitated Delirium: Continues despite quite aggressive pain/sedation medication.  -Precedex has been started -Dilaudid infusion and being given p.o., therefore we will try to get off the IV Dilaudid -RA SS score goal 0 -Risperidone 1 mg twice daily -Continue Klonopin 1 mg every 6 hours -Valproic acid currently at 500 mg every 8 - PRN Zyprexa for emergency use assuming QTC safe -DC nicotine patch -QTc intervals  R Femoral DVT with suspected pulmonary embolism. -  Continue low molecular weight heparin -Monitor platelets and hemoglobin   Dysphagia.   P: Tube feeding Free water Need PEG in the future  Urinary retention P: Foley placement  Hyperglycemia CBG (last 3)  Recent Labs    04/03/18 0012 04/03/18 0351 04/03/18 0753  GLUCAP 128* 127* 101*    P:  Sliding scale insulin protocol   Best practice   Nutrition: TF DVT: therapeutic LMWH Glucose: n/a Sedation: PAD PUD: Pepcid  Family - family has not been available. Appreciate palliative care/social work ongoing efforts to contact.  Dispo  LTAC ineliglble due to insurance   My critical care time excluding procedures: 35 minutes   Brett CanalesSteve Minor ACNP [facility] Pager [phone] till 1 pm If no answer page [phone] 04/03/2018, 8:59 AM  Attending Note:  62 year old male who [facility] hypercarbic respiratory failure from COPD exacerbation from rhinovirus and subsequent bacterial pneumonia.  Patient was trached but is making very little progress with weaning with coarse BS diffusely.  I reviewed CXR myself, trach is in a good position.  Discussed with PCCM-NP.  Will continue vent weaning efforts.   Will need PEG tube.  Continue dilaudid drip.  Continue precedex.  PO resperidal, clonazepam, zyprex and may need need help form psych.  PCCM will continue to follow.  The patient is critically ill with multiple organ systems failure and requires high complexity decision making for assessment and support, frequent evaluation and titration of therapies, application of advanced monitoring technologies and extensive interpretation of multiple databases.   Critical Care Time devoted to patient care services described in this note is  32  Minutes. This time reflects time of care of this signee Dr Koren BoundWesam . This critical care time does not reflect procedure time, or teaching time or supervisory time of PA/NP/Med student/Med Resident etc but could involve care discussion time.  Alyson ReedyWesam G. , M.D. [facility]. Pager: [phone]. After hours pager: [phone].

## 2018-04-03 NOTE — Progress Notes (Signed)
eLink Physician-Brief Progress Note Patient Name: ROHAAN BONAWITZ DOB: 02-15-1956 MRN: [id number]   Date of Service  04/03/2018  HPI/Events of Note    eICU Interventions  Recurring urinary retension.  Now scan > 500. Agitated, confused and trached.   Placed foley order.      Intervention Category Minor Interventions: Other:;Routine modifications to care plan (e.g. PRN medications for pain, fever)  Ranee Gosselin 04/03/2018, 1:03 AM

## 2018-04-04 LAB — GLUCOSE, CAPILLARY
Glucose-Capillary: 110 mg/dL — ABNORMAL HIGH (ref 70–99)
Glucose-Capillary: 110 mg/dL — ABNORMAL HIGH (ref 70–99)
Glucose-Capillary: 111 mg/dL — ABNORMAL HIGH (ref 70–99)
Glucose-Capillary: 117 mg/dL — ABNORMAL HIGH (ref 70–99)
Glucose-Capillary: 130 mg/dL — ABNORMAL HIGH (ref 70–99)
Glucose-Capillary: 144 mg/dL — ABNORMAL HIGH (ref 70–99)

## 2018-04-04 LAB — BASIC METABOLIC PANEL
Anion gap: 11 (ref 5–15)
BUN: 31 mg/dL — ABNORMAL HIGH (ref 6–20)
CO2: 32 mmol/L (ref 22–32)
Calcium: 8.6 mg/dL — ABNORMAL LOW (ref 8.9–10.3)
Chloride: 101 mmol/L (ref 98–111)
Creatinine, Ser: 0.74 mg/dL (ref 0.61–1.24)
GFR calc Af Amer: >60 mL/min (ref >60)
GFR calc non Af Amer: >60 mL/min (ref >60)
Glucose, Bld: 367 mg/dL — ABNORMAL HIGH (ref 70–99)
POTASSIUM: 4 mmol/L (ref 3.5–5.1)
Sodium: 144 mmol/L (ref 135–145)

## 2018-04-04 LAB — CBC
HCT: 41 % (ref 39.0–52.0)
Hemoglobin: 12 g/dL — ABNORMAL LOW (ref 13.0–17.0)
MCH: 29.9 pg (ref 26.0–34.0)
MCHC: 29.3 g/dL — ABNORMAL LOW (ref 30.0–36.0)
MCV: 102 fL — ABNORMAL HIGH (ref 80.0–100.0)
NRBC: 0 % (ref 0.0–0.2)
Platelets: 328 10*3/uL (ref 150–400)
RBC: 4.02 MIL/uL — ABNORMAL LOW (ref 4.22–5.81)
RDW: 13.2 % (ref 11.5–15.5)
WBC: 8.7 10*3/uL (ref 4.0–10.5)

## 2018-04-04 LAB — MAGNESIUM: Magnesium: 1.7 mg/dL (ref 1.7–2.4)

## 2018-04-04 MED ORDER — MAGNESIUM SULFATE 2 GM/50ML IV SOLN: 2.0000 g | Freq: Once | INTRAVENOUS | Status: DC

## 2018-04-04 MED ORDER — MAGNESIUM SULFATE 4 GM/100ML IV SOLN
4.0000 g | Freq: Once | INTRAVENOUS | Status: AC
  Administered 2018-04-04: 4 g via INTRAVENOUS
  Filled 2018-04-04: qty 100

## 2018-04-04 NOTE — Progress Notes (Signed)
PT Cancellation Note and Discharge  Patient Details Name: Brian [name] MRN: [id number] DOB: 08-17-1955   Cancelled Treatment:    Reason Eval/Treat Not Completed: Fatigue/lethargy limiting ability to participate. Pt remains heavily sedated, requiring 60% FiO2 on vent, and unable to arouse to participate in therapeutic exercises. Pt cannot follow simple commands. Patient is being discharged from PT services secondary to: Medical decline- will need to re-order to resume therapy services. Will sign off at this time. Please re-consult if medical status improves.  Laurina Bustle, PT, DPT Acute Rehabilitation Services Pager [phone] Office [phone]     Vanetta Mulders 04/04/2018, 9:43 AM

## 2018-04-04 NOTE — Plan of Care (Signed)
  Problem: Health Behavior/Discharge Planning: Goal: Ability to manage health-related needs will improve Outcome: Not Progressing   Problem: Clinical Measurements: Goal: Ability to maintain clinical measurements within normal limits will improve Outcome: Not Progressing Goal: Will remain free from infection Outcome: Not Progressing Goal: Diagnostic test results will improve Outcome: Not Progressing Goal: Respiratory complications will improve Outcome: Not Progressing Goal: Cardiovascular complication will be avoided Outcome: Not Progressing   Problem: Activity: Goal: Risk for activity intolerance will decrease Outcome: Not Progressing   Problem: Nutrition: Goal: Adequate nutrition will be maintained Outcome: Not Progressing   Problem: Coping: Goal: Level of anxiety will decrease Outcome: Not Progressing   Problem: Elimination: Goal: Will not experience complications related to bowel motility Outcome: Not Progressing Goal: Will not experience complications related to urinary retention Outcome: Not Progressing   Problem: Pain Managment: Goal: General experience of comfort will improve Outcome: Not Progressing   Problem: Skin Integrity: Goal: Risk for impaired skin integrity will decrease Outcome: Not Progressing   Problem: Activity: Goal: Ability to tolerate increased activity will improve Outcome: Not Progressing   Problem: Role Relationship: Goal: Method of communication will improve Outcome: Not Progressing

## 2018-04-04 NOTE — Progress Notes (Signed)
NAME:  Elsie SaasRichard B Haman, MRN:  [id number], DOB:  02/13/1956, LOS: 23 ADMISSION DATE:  04/25/2017, CONSULTATION DATE:  04/25/2017 REFERRING MD:  Lamont Snowballifenbark - ED, CHIEF COMPLAINT:  Acute respiratory failure.   Brief Summary:  62 year old man who [facility]ne[facility] respiratory failure in the setting of exacerbation of COPD, Rhinovirus, proteus mirabilis / H. Influenzae pneumonia. New DVT on anticoagulation. Extubated but required reintubation for hypoxia.  Completed appropriate antibiotics.  Course complicated by ETOH withdrawal and agitated delirium.  No evidence of shunt on TEE.  S/P trach, cortrak.   Social hx elicited 03/28/18 - per RN - homeless, drug abuser, alcoholic  Past Medical History  ETOH Abuse Cardiomyopathy  Cocaine abuse  COPD (likely severe based on huge barrel chest) Homelessness  Medical non-compliance  Paroxysmal VT Suicide attempt  Tobacco Abuse  DVT   Significant Events:  11/29  Admit with hypercarbic resp fx in setting of PNA + AECOPD.  DVT+ 12/05 Precedex   12/07 HFNC 15L, interactive on precedex 12/08 Re-intubated for hypoxia (? atx vs PNA) 12/09 High peep/fio2. Slowly weaning 12/10 CT chest and bubble study as FIO2 and PEEP requirements seem out of proportion to CXR findings.  12/11 Right to left shunt (small via TTE). CT chest w/ bibasilar PNA; TEE ordered. Still not weaning much. TEE was neg for sig shunt. ABX stopped.  12/12 Trach placed. CXR stable. Still requiring high FIO2 and PEEP. Trach aspirate with proteus and H flu 12/13 Cortrack.  12/15 - Tmax 100.2.  I/O- 1L positive in last 24 hours, 10L positive for admit.  RN reports ongoing agitated delirium.  No acute events overnight.  12/16 - fent gtt titrated off. 60% fio3/peep 5+. Follows commands. Afebrile 12/17 - Febrile, vent settins weaned.  12/18 -On fent gtt, risperdan scheduled, nicotine patch, ativan prn and dialudid prn -> still Ongoing agitation  itermittently. CPT held. On 70% fio2 and peep 8 12/19 - 60% fio2, peep 8. Now on dilaudid gtt and precedex gtt with ativan prn, risperdal scheduled and nicotine patch. Still very agitated despite now dilaudid infuison and precedex. Trach changed to XLT 12/21- sedated on dilaudid and precedex. Will attempt an awakening trial. 12/24 remains supremely agitated at times despite dilaudid and dex infusions. Riseperdal and Depakote.  12/25 agitation improved  Procedures  ETT 12/8 >>12/12  RUE PICC 12/9 >> Trach 12/12 >> changed to XLT 12/19 >>  Diagnostic tests:  DVT 11/29 > acute DVT involving the right common femoral vein, right femoral vein, right proximal profunda CT chest 12/10 > R>L PNA/consolidation left superior seg pulm nodules.  ECHO w/ bubble study 12/10 >> small right to left shunt.  Ejection fraction 40 to 45%, mild diffuse hypokinesis CTA chest 12/24 > No evidence PE, Bilateral effusion with persistent bilateral lower lobe consolidation. Persistent ATX in RML and lingula. Interval development of multifocal bilateral pulmonary nodules which are either ground-glass or part solid in appearance. Part solid nodule in the right upper lobe may have a central cavitary component.  Cultures:  Tracheal aspirate 12/2 >> proteus mirabilis >> pan sensitive                Haemophilus influenzae  Tracheal aspirate 12/8 >> neg BCx2 12/8 >> negative  RVP 11/29 >> rhinovirus   Antibiotics  Rocephin 12/2 >> 12/11  Interim history/subjective:  Comfortable on vent.  Sedated, vitals WNL  Objective   Blood pressure 117/70, pulse (!) 55, temperature 99 F (37.2 C), temperature  source Oral, resp. rate 18, height 5\' 7"  (1.702 m), weight 81.6 kg, SpO2 95 %.    Vent Mode: PRVC FiO2 (%):  [60 %-70 %] 60 % Set Rate:  [18 bmp] 18 bmp Vt Set:  [510 mL] 510 mL PEEP:  [8 cmH20] 8 cmH20 Plateau Pressure:  [15 cmH20-18 cmH20] 18 cmH20   Intake/Output Summary (Last 24 hours) at 04/04/2018 8003 Last data  filed at 04/04/2018 0700 Gross per 24 hour  Intake 1469.48 ml  Output 3295 ml  Net -1825.52 ml   Filed Weights   04/02/18 0500 04/03/18 0305 04/04/18 0500  Weight: 82.1 kg 83 kg 81.6 kg    General: Elderly disheveled male who is well sedated currently, in NAD HEENT: Trach C/D/I Neuro: Currently heavily sedated (when not sedated is agitated and thrashing in the bed) CV: Sounds are distant PULM: even/non-labored, lungs bilaterally managed throughout KJ:ZPHX, non-tender, bsx4 active  Extremities: warm/dry, negative edema  Skin: no rashes or lesions   Assessment & Plan: 27  Days LOS    Acute on chronic hypoxic Respiratory Failure s/p trach 03/21/18 -in setting of rhinovirus, proteus/H.influenza PNA, and likely AECOPD. There is also some concern for PE based on positive DVT. Also history of CHF with mild vascular congestion on CXR.  Bubble study also indicated a very small R>L shunt. Truthfully, its not clear that we fully understand why he continues to have a significant degree of hypoxia.  - Currently on 70 % FiO2, wean FiO2 as tolerated - Continued diuresis - VAP bundle  COPD NOS (but big barrel chest so likely severe) with chronic hypercarbic respiratory failure. - Bronchodilators  Pulmonary Nodules - Will need outpatient imaging in 3-6 months   Acute on Chronic Systolic Heart Failure-EF 35% : remains volume 1L pos on I&O for admission, which is an improvement, however, CXR continues to appear as volume up.  -Diuresis as tolerated -ACE inhibitor in future  Acute Encephalopathy / Agitated Delirium: very sensitive to medication changes (goes from comfortable to agitated and thrashing in bed) - Continue precedex and dilaudid gtts - Continue risperidone 1 mg twice daily - Continue Klonopin 1 mg every 6 hours - Continue Valproic acid currently at 500 mg every 8 - PRN Zyprexa for emergency use assuming QTC safe - DC nicotine patch - Monitor QTc intervals - RASS score goal 0 -  Might need psych consult (depending on goals of care following family meeting 12/27)  R Femoral DVT with suspected pulmonary embolism. - Continue low molecular weight heparin - Monitor platelets and hemoglobin  Dysphagia.   - Continue tube feeding  Urinary retention - Continue foley, urecholine  Hyperglycemia - Sliding scale insulin if glucose consistently > 180  Goals of care / failure to thrive - palliative care following, appreciate the assistance - palliative care to meet with family Fri 12/27 who are considering withdrawal of care  Best practice   Nutrition: TF DVT: therapeutic LMWH Glucose: n/a Sedation: PAD PUD: Pepcid  Family Family has not been available; however, palliative care were able to contact them (niece, Marcelino Duster) and are planning meeting for Friday 12/27 to discuss withdrawal. Dispo:  ICU.  LTAC ineliglble due to insurance   Rutherford Guys, [place] [facility] [place] [facility] Pager: [phone]  or [phone] 04/04/2018, 9:45 AM

## 2018-04-05 DIAGNOSIS — Z7189 Other specified counseling: Secondary | ICD-10-CM

## 2018-04-05 LAB — CBC
HCT: 42.3 % (ref 39.0–52.0)
Hemoglobin: 12.6 g/dL — ABNORMAL LOW (ref 13.0–17.0)
MCH: 30.1 pg (ref 26.0–34.0)
MCHC: 29.8 g/dL — ABNORMAL LOW (ref 30.0–36.0)
MCV: 101 fL — ABNORMAL HIGH (ref 80.0–100.0)
Platelets: 331 10*3/uL (ref 150–400)
RBC: 4.19 MIL/uL — ABNORMAL LOW (ref 4.22–5.81)
RDW: 13.3 % (ref 11.5–15.5)
WBC: 9.4 10*3/uL (ref 4.0–10.5)
nRBC: 0 % (ref 0.0–0.2)

## 2018-04-05 LAB — BASIC METABOLIC PANEL
Anion gap: 10 (ref 5–15)
BUN: 42 mg/dL — ABNORMAL HIGH (ref 6–20)
CO2: 36 mmol/L — ABNORMAL HIGH (ref 22–32)
Calcium: 9.3 mg/dL (ref 8.9–10.3)
Chloride: 99 mmol/L (ref 98–111)
Creatinine, Ser: 0.75 mg/dL (ref 0.61–1.24)
GFR calc Af Amer: >60 mL/min (ref >60)
GFR calc non Af Amer: >60 mL/min (ref >60)
Glucose, Bld: 134 mg/dL — ABNORMAL HIGH (ref 70–99)
POTASSIUM: 4.1 mmol/L (ref 3.5–5.1)
SODIUM: 145 mmol/L (ref 135–145)

## 2018-04-05 LAB — PHOSPHORUS: PHOSPHORUS: 4.4 mg/dL (ref 2.5–4.6)

## 2018-04-05 LAB — GLUCOSE, CAPILLARY
Glucose-Capillary: 110 mg/dL — ABNORMAL HIGH (ref 70–99)
Glucose-Capillary: 119 mg/dL — ABNORMAL HIGH (ref 70–99)
Glucose-Capillary: 128 mg/dL — ABNORMAL HIGH (ref 70–99)
Glucose-Capillary: 137 mg/dL — ABNORMAL HIGH (ref 70–99)
Glucose-Capillary: 148 mg/dL — ABNORMAL HIGH (ref 70–99)
Glucose-Capillary: 155 mg/dL — ABNORMAL HIGH (ref 70–99)

## 2018-04-05 LAB — MRSA PCR SCREENING: MRSA by PCR: NEGATIVE

## 2018-04-05 LAB — MAGNESIUM: Magnesium: 2.2 mg/dL (ref 1.7–2.4)

## 2018-04-05 NOTE — Progress Notes (Signed)
NAME:  Brian [name], MRN:  [id number], DOB:  1956/04/08, LOS: 23 ADMISSION DATE:  02/14/2018, CONSULTATION DATE:  03/05/2018 REFERRING MD:  Lamont Snowball - ED, CHIEF COMPLAINT:  Acute respiratory failure.   Brief Summary:  62 year old man who was intubated and transferred from [facility] for hypercapneic respiratory failure in the setting of exacerbation of COPD, Rhinovirus, proteus mirabilis / H. Influenzae pneumonia. New DVT on anticoagulation. Extubated but required reintubation for hypoxia.  Completed appropriate antibiotics.  Course complicated by ETOH withdrawal and agitated delirium.  No evidence of shunt on TEE.  S/P trach, cortrak.   Social hx elicited 03/28/18 - per RN - homeless, drug abuser, alcoholic  Past Medical History  ETOH Abuse Cardiomyopathy  Cocaine abuse  COPD (likely severe based on huge barrel chest) Homelessness  Medical non-compliance  Paroxysmal VT Suicide attempt  Tobacco Abuse  DVT   Significant Events:  11/29  Admit with hypercarbic resp fx in setting of PNA + AECOPD.  DVT+ 12/05 Precedex   12/07 HFNC 15L, interactive on precedex 12/08 Re-intubated for hypoxia (? atx vs PNA) 12/09 High peep/fio2. Slowly weaning 12/10 CT chest and bubble study as FIO2 and PEEP requirements seem out of proportion to CXR findings.  12/11 Right to left shunt (small via TTE). CT chest w/ bibasilar PNA; TEE ordered. Still not weaning much. TEE was neg for sig shunt. ABX stopped.  12/12 Trach placed. CXR stable. Still requiring high FIO2 and PEEP. Trach aspirate with proteus and H flu 12/13 Cortrack.  12/15 - Tmax 100.2.  I/O- 1L positive in last 24 hours, 10L positive for admit.  RN reports ongoing agitated delirium.  No acute events overnight.  12/16 - fent gtt titrated off. 60% fio3/peep 5+. Follows commands. Afebrile 12/17 - Febrile, vent settins weaned.  12/18 -On fent gtt, risperdan scheduled, nicotine patch, ativan prn and dialudid prn -> still Ongoing agitation  itermittently. CPT held. On 70% fio2 and peep 8 12/19 - 60% fio2, peep 8. Now on dilaudid gtt and precedex gtt with ativan prn, risperdal scheduled and nicotine patch. Still very agitated despite now dilaudid infuison and precedex. Trach changed to XLT 12/21- sedated on dilaudid and precedex. Will attempt an awakening trial. 12/24 remains supremely agitated at times despite dilaudid and dex infusions. Riseperdal and Depakote.  12/25 agitation improved 1226 - Comfortable on vent.  Sedated, vitals WNL  Procedures  ETT 12/8 >>12/12  RUE PICC 12/9 >> Trach 12/12 >> changed to XLT 12/19 >>  Diagnostic tests:  DVT 11/29 > acute DVT involving the right common femoral vein, right femoral vein, right proximal profunda CT chest 12/10 > R>L PNA/consolidation left superior seg pulm nodules.  ECHO w/ bubble study 12/10 >> small right to left shunt.  Ejection fraction 40 to 45%, mild diffuse hypokinesis CTA chest 12/24 > No evidence PE, Bilateral effusion with persistent bilateral lower lobe consolidation. Persistent ATX in RML and lingula. Interval development of multifocal bilateral pulmonary nodules which are either ground-glass or part solid in appearance. Part solid nodule in the right upper lobe may have a central cavitary component.  Cultures:  Tracheal aspirate 12/2 >> proteus mirabilis >> pan sensitive                Haemophilus influenzae  Tracheal aspirate 12/8 >> neg BCx2 12/8 >> negative  RVP 11/29 >> rhinovirus   Antibiotics  Rocephin 12/2 >> 12/11  Interim history/subjective:  12.27 - 60% fio2, peep 8 - unchanged .sedated on vent. Goals of care  for 04/05/18 today  Objective   Blood pressure 129/65, pulse 60, temperature 99.4 F (37.4 C), temperature source Oral, resp. rate 18, height 5\' 7"  (1.702 m), weight 82.8 kg, SpO2 90 %.    Vent Mode: PRVC FiO2 (%):  [60 %] 60 % Set Rate:  [18 bmp] 18 bmp Vt Set:  [510 mL] 510 mL PEEP:  [8 cmH20] 8 cmH20 Plateau Pressure:  [15 cmH20-18  cmH20] 16 cmH20   Intake/Output Summary (Last 24 hours) at 04/05/2018 1016 Last data filed at 04/05/2018 0800 Gross per 24 hour  Intake 2139.62 ml  Output 2410 ml  Net -270.38 ml   Filed Weights   04/03/18 0305 04/04/18 0500 04/05/18 0446  Weight: 83 kg 81.6 kg 82.8 kg     General Appearance:  Looks criticall ill OBESE - yes Head:  Normocephalic, without obvious abnormality, atraumatic Eyes:  PERRL - yes, conjunctiva/corneas - clear     Ears:  Normal external ear canals, both ears Nose:  G tube - yes, panda Throat:  ETT TUBE - no , OG tube - no. TRACH + Neck:  Supple,  No enlargement/tenderness/nodules Lungs: Clear to auscultation bilaterally, Ventilator   Synchrony - yes Heart:  S1 and S2 normal, no murmur, CVP - no.  Pressors - no Abdomen:  Soft, no masses, no organomegaly Genitalia / Rectal:  Not done Extremities:  Extremities- intact Skin:  ntact in exposed areas . Sacral area - not examined Neurologic:  Sedation - gtt -> RASS - -3      Assessment & Plan: 28  Days LOS    Acute on chronic hypoxic Respiratory Failure s/p trach 03/21/18 -in setting of rhinovirus, proteus/H.influenza PNA, and likely AECOPD. There is also some concern for PE based on positive DVT. Also history of CHF with mild vascular congestion on CXR.  Bubble study also indicated a very small R>L shunt. Truthfully, its not clear that we fully understand why he continues to have a significant degree of hypoxia.     - 04/05/2018 - > does not meet criteria for SBT/Extubation in setting of Acute Respiratory Failure due to delirium and sever hypoxemia due to Bilateral LL consolidaton causing functional shunt. Now on 60% fio2  PLAN - PRVC - Continued diuresis - VAP bundle  COPD NOS (but big barrel chest so likely severe) with chronic hypercarbic respiratory failure. - Bronchodilators  Pulmonary Nodules - Will need outpatient imaging in 3-6 months   Acute on Chronic Systolic Heart Failure-EF 35% :  remains volume 1L pos on I&O for admission, which is an improvement, however, CXR continues to appear as volume up.  -Diuresis as tolerated -ACE inhibitor in future  Acute Encephalopathy / Agitated Delirium: very sensitive to medication changes (goes from comfortable to agitated and thrashing in bed) - Continue precedex and dilaudid gtts - Continue risperidone 1 mg twice daily - Continue Klonopin 1 mg every 6 hours - Continue Valproic acid currently at 500 mg every 8 - PRN Zyprexa for emergency use assuming QTC safe - DC nicotine patch - Monitor QTc intervals - RASS score goal 0 - Might need psych consult (depending on goals of care following family meeting 12/27)  R Femoral DVT with suspected pulmonary embolism. - Continue low molecular weight heparin - Monitor platelets and hemoglobin  Dysphagia.   - Continue tube feeding  Urinary retention - Continue foley, urecholine  Hyperglycemia - Sliding scale insulin if glucose consistently > 180  Goals of care / failure to thrive - palliative care following, appreciate  the assistance - palliative care to meet with family Fri 12/27 who are considering withdrawal of care  Best practice   Nutrition: TF DVT: therapeutic LMWH Glucose: n/a Sedation: PAD PUD: Pepcid  Family Family has not been available; however, palliative care were able to contact them (niece, Marcelino Duster) and are planning meeting for Friday 12/27 to discuss withdrawal. Dispo:  ICU.  LTAC ineliglble due to insurance    ATTESTATION & SIGNATURE   The patient APOLONIO CUTTING is critically ill with multiple organ systems failure and requires high complexity decision making for assessment and support, frequent evaluation and titration of therapies, application of advanced monitoring technologies and extensive interpretation of multiple databases.   Critical Care Time devoted to patient care services described in this note is  30  Minutes. This time reflects time of care  of this signee Dr Kalman Shan. This critical care time does not reflect procedure time, or teaching time or supervisory time of PA/NP/Med student/Med Resident etc but could involve care discussion time     Dr. Kalman Shan, M.D., [facility].C.P Pulmonary and Critical Care Medicine Staff Physician [facility] Pager: [phone], If no answer or between  15:00h - 7:00h: call [phone]  04/05/2018 10:25 AM

## 2018-04-05 NOTE — Progress Notes (Signed)
Daily Progress Note   Patient Name: Brian [name]       Date: 04/05/2018 DOB: 06/22/1955  Age: 62 y.o. MRN#: [id number] Attending Physician: Brand Males, MD Primary Care Physician: Tawny Asal Admit Date: 03/01/2018  Reason for Consultation/Follow-up: Establishing goals of care, Psychosocial/spiritual support and Withdrawal of life-sustaining treatment  Subjective: Patient seen, chart reviewed.  Met with patient's niece Morad Tal as well as other nieces Frankey Shown and Jana Half.  Patient has a brother (Michelle's father, Nicole Kindred) who was unable to attend today's meeting.  Sharyn Lull has been the decision maker thus far with his support.  Family appears to be ready to withdraw ventilator support as patient is not improving despite antibiotics pulmonary support.  He remains on Precedex, Klonopin, Ativan as needed, Zyprexa, Risperdal, Depakote, as well as Dilaudid to maintain comfort.  When efforts have been made to wean off of the ventilator, decrease sedation, patient becomes extremely agitated.  We have not been able to achieve a waking calm for Mr. Fiallos unfortunately  Family members present today shared that he would not want to live this way  Length of Stay: 28  Current Medications: Scheduled Meds:  . aspirin  81 mg Per Tube Daily  . atorvastatin  80 mg Per Tube q1800  . bethanechol  5 mg Per Tube TID  . chlorhexidine gluconate (MEDLINE KIT)  15 mL Mouth Rinse BID  . Chlorhexidine Gluconate Cloth  6 each Topical Daily  . clonazepam  1 mg Per Tube Q6H  . enoxaparin (LOVENOX) injection  80 mg Subcutaneous Q12H  . famotidine  20 mg Per Tube BID  . feeding supplement (PRO-STAT SUGAR FREE 64)  30 mL Per Tube Daily  . folic acid  1 mg Per Tube Daily  . free  water  100 mL Per Tube Q8H  . furosemide  60 mg Intravenous Q8H  . haloperidol lactate  1 mg Intravenous Once  . HYDROmorphone  2 mg Oral Q4H  . ipratropium-albuterol  3 mL Nebulization QID  . mouth rinse  15 mL Mouth Rinse 10 times per day  . potassium chloride  20 mEq Oral BID  . risperiDONE  1 mg Per Tube BID  . sennosides  5 mL Oral BID  . sodium chloride flush  10-40 mL Intracatheter Q12H  . thiamine  100 mg Per Tube Daily    Continuous Infusions: . dexmedetomidine 1.2 mcg/kg/hr (04/05/18 1600)  . feeding supplement (VITAL AF 1.2 CAL) 1,000 mL (04/05/18 1549)  . HYDROmorphone 1 mg/hr (04/05/18 1600)  . valproate sodium Stopped (04/05/18 1413)    PRN Meds: acetaminophen, albuterol, bisacodyl, docusate, HYDROmorphone, Influenza vac split quadrivalent PF, LORazepam, OLANZapine, ondansetron (ZOFRAN) IV, polyethylene glycol, sodium chloride flush  Physical Exam Vitals signs and nursing note reviewed.  Constitutional:      Comments: Patient minimally responsive.  Still on sedation.  Still in restraints  Neck:     Comments: Trach to ventilator support Cardiovascular:     Rate and Rhythm: Normal rate.     Comments: Prolonged QT Pulmonary:     Comments: Trach with ventilator support No progress weaning Genitourinary:    Comments: Foley Neurological:     Comments: No overt agitation but patient is sedated.  Otherwise unable to test  Psychiatric:     Comments: Unable to test             Vital Signs: BP 127/60   Pulse 66   Temp 99.8 F (37.7 C) (Oral)   Resp 18   Ht '5\' 7"'  (1.702 m)   Wt 82.8 kg   SpO2 92%   BMI 28.59 kg/m  SpO2: SpO2: 92 % O2 Device: O2 Device: Ventilator O2 Flow Rate: O2 Flow Rate (L/min): 15 L/min  Intake/output summary:   Intake/Output Summary (Last 24 hours) at 04/05/2018 1650 Last data filed at 04/05/2018 1600 Gross per 24 hour  Intake 2129.47 ml  Output 2160 ml  Net -30.53 ml   LBM: Last BM Date: 04/03/18 Baseline Weight: Weight:  89.8 kg Most recent weight: Weight: 82.8 kg       Palliative Assessment/Data:    Flowsheet Rows     Most Recent Value  Intake Tab  Referral Department  Critical care  Unit at Time of Referral  ICU  Palliative Care Primary Diagnosis  Sepsis/Infectious Disease  Date Notified  03/28/18  Palliative Care Type  Return patient Palliative Care  Date of Admission  02/14/2018  Date first seen by Palliative Care  03/29/18  # of days Palliative referral response time  1 Day(s)  # of days IP prior to Palliative referral  20  Clinical Assessment  Palliative Performance Scale Score  30%  Pain Max last 24 hours  Not able to report  Pain Min Last 24 hours  Not able to report  Dyspnea Max Last 24 Hours  Not able to report  Dyspnea Min Last 24 hours  Not able to report  Nausea Max Last 24 Hours  Not able to report  Nausea Min Last 24 Hours  Not able to report  Anxiety Max Last 24 Hours  Not able to report  Anxiety Min Last 24 Hours  Not able to report  Other Max Last 24 Hours  Not able to report  Psychosocial & Spiritual Assessment  Palliative Care Outcomes  Patient/Family meeting held?  No  Palliative Care follow-up planned  Yes, Facility      Patient Active Problem List   Diagnosis Date Noted  . Ventilator dependence ([place])   . Agitation   . Pressure injury of skin 03/21/2018  . Status post tracheostomy ([place])   . Respiratory failure ([place]) 02/24/2018  . Precordial chest pain   . Neck pain on left side 02/24/2018  . Left arm pain   . Left shoulder pain 02/23/2018  . Prolonged Q-T interval on  ECG 01/06/2018  . Pain in right knee 01/06/2018  . Atypical chest pain 01/05/2018  . Hypokalemia 01/05/2018  . Acute on chronic combined systolic and diastolic CHF (congestive heart failure) ([place]) 01/05/2018  . Alcohol abuse 01/05/2018  . Pulmonary contusion 01/01/2018  . COPD exacerbation ([place]) 12/15/2017  . Acute respiratory failure ([place]) 11/13/2017  . Palliative care encounter   .  Palliative care by specialist   . Acute on chronic systolic CHF (congestive heart failure), NYHA class 4 ([place]) 11/03/2017  . Hyperglycemia 11/03/2017  . Acute respiratory failure with hypoxia and hypercapnia ([place]) 10/26/2017  . Acute on chronic respiratory failure with hypoxia ([place]) 09/27/2017  . Tobacco dependence 09/27/2017  . Chronic systolic CHF (congestive heart failure) ([place]) 09/25/2017  . COPD with acute exacerbation ([place]) 09/30/2015  . Substance induced mood disorder ([place]) 02/04/2013  . Polysubstance (excluding opioids) dependence, daily use ([place]) 02/03/2013  . Alcohol dependence ([place]) 02/03/2013  . Gout attack 11/12/2012  . Closed fracture of 5th metacarpal 11/12/2012  . Chest pain 08/27/2012  . Acute exacerbation of chronic obstructive pulmonary disease (COPD) ([place])   . Homelessness   . Cocaine abuse [facility])     Palliative Care Assessment & Plan   Patient Profile: 62 y.o. male  with past medical history of osteoarthritis, COPD, ischemic cardiomyopathy with an ejection fraction of 25% (ejection fraction in July was 30 to 35%), history of V. tach, history of homelessness, polysubstance use disorder admitted on 02/18/2018 with hypercapnic respiratory failure.  He was intubated and transferred from [facility] to [facility] for more intensive care.  He was extubated on 03/12/2018 but required reintubation on 03/15/2018.  Tracheostomy was performed on 03/21/2018.  At this point, he is unable to use a [employer] valve.  His hospitalization has been complicated by alcohol withdrawal, agitation.  He remains on Precedex secondary to agitation as well as restraints.  12/27: Despite continued antibiotics, ventilator support, aggressive treatment, patient's lung function is not improving and he has not been able to wean off the ventilator.  Family meeting was held with patient's nieces today who are the decision makers.  Patient is now a partial code, no CPR, no pressors, no  defibrillation, do not reintubate if patient self extubate   Recommendations/Plan:  Partial code.  Continue with ventilator overnight but note do not progress care to CPR, defibrillation, pressors if he were to decompensate overnight.  Do not reintubate if he self extubate  Plan is for palliative medicine provider to phone  Niece, Sharyn Lull, in the morning to arrange a time this weekend to withdraw ventilator support.  They would like their minister to come prior to this happening and plans are to call him tonight to arrange that  Continue with ventilator support, all medications as currently ordered  Goals of Care and Additional Recommendations:  Limitations on Scope of Treatment: No Chemotherapy and No Surgical Procedures  Code Status:    Code Status Orders  (From admission, onward)         Start     Ordered   04/05/18 1624  Limited resuscitation (code)  Continuous    Question Answer Comment  In the event of cardiac or respiratory ARREST: Initiate Code Blue, Call Rapid Response No   In the event of cardiac or respiratory ARREST: Perform CPR No   In the event of cardiac or respiratory ARREST: Perform Intubation/Mechanical Ventilation Yes   In the event of cardiac or respiratory ARREST: Use NIPPV/BiPAp only if indicated No  In the event of cardiac or respiratory ARREST: Administer ACLS medications if indicated No   In the event of cardiac or respiratory ARREST: Perform Defibrillation or Cardioversion if indicated No      04/05/18 1623        Code Status History    Date Active Date Inactive Code Status Order ID Comments User Context   02/26/2018 1044 04/05/2018 1623 Full Code [id number]  Marisa Sprinkles, MD Inpatient   02/24/2018 0208 02/26/2018 1351 Full Code [id number]  Ivor Costa, MD ED   01/05/2018 2247 01/07/2018 1802 Full Code [id number]  Reubin Milan, MD Inpatient   01/05/2018 2058 01/05/2018 2247 Full Code [id number]  Reubin Milan, MD ED   01/01/2018 0329  01/03/2018 1810 Full Code [id number]  Harrie Foreman, MD Inpatient   12/15/2017 1726 12/18/2017 1747 Full Code [id number]  Cristal Deer, MD ED   11/13/2017 0439 11/16/2017 1312 Full Code [id number]  Rise Patience, MD ED   11/03/2017 1553 11/06/2017 1712 Full Code [id number]  Lady Deutscher, MD ED   10/26/2017 2338 10/28/2017 2243 Full Code [id number]  Hugelmeyer, Alexis, DO ED   10/14/2017 2207 10/16/2017 1644 Full Code [id number]  Vianne Bulls, MD ED   09/27/2017 1052 10/01/2017 1615 Full Code [id number]  Karmen Bongo, MD ED   09/25/2017 0504 09/26/2017 1633 Full Code [id number]  Vianne Bulls, MD ED   10/20/2015 0727 10/22/2015 1705 Full Code [id number]  Rise Patience, MD Inpatient   09/30/2015 2027 10/07/2015 1542 Full Code [id number]  Truett Mainland, DO Inpatient   02/03/2013 1543 02/08/2013 1137 Full Code [id number]  Waylan Boga, NP Inpatient   02/03/2013 0347 02/03/2013 1543 Full Code [id number]  Martie Lee, PA-C ED   11/10/2012 2026 11/12/2012 1654 Full Code [id number]  Jonetta Osgood, MD Inpatient   08/07/2012 0158 08/08/2012 1554 Full Code [id number]  Verl Dicker, PA-C ED   06/02/2012 2032 06/04/2012 0014 Full Code [id number]  Delaine Lame ED   04/20/2011 0506 04/22/2011 1428 Full Code [id number]  Mariel Craft, RN ED   03/22/2011 0600 03/22/2011 2315 Full Code [id number]  Chauncy Passy, MD ED       Prognosis:  Prognosis would likely be hours to days once ventilator support withdrawn  Discharge Planning:  Anticipated Hospital Death once ventilator support is withdrawn    Thank you for allowing the Palliative Medicine Team to assist in the care of this patient.   Time In: 1500 Time Out: 1615 Total Time 75 min Prolonged Time Billed  yes       Greater than 50%  of this time was spent counseling and coordinating care related to the above assessment and plan.  Dory Horn, NP  Please contact Palliative Medicine Team phone at [phone] for questions and  concerns.

## 2018-04-05 NOTE — Social Work (Signed)
CSW aware there is a family meeting with PMT scheduled for today. CSW continuing to follow for support with disposition when medically appropriate.  Octavio Graves, MSW, [facility] Clinical Social Work [phone]

## 2018-04-06 DIAGNOSIS — R0603 Acute respiratory distress: Secondary | ICD-10-CM

## 2018-04-06 LAB — CBC
HCT: 42.5 % (ref 39.0–52.0)
Hemoglobin: 12.5 g/dL — ABNORMAL LOW (ref 13.0–17.0)
MCH: 29.8 pg (ref 26.0–34.0)
MCHC: 29.4 g/dL — ABNORMAL LOW (ref 30.0–36.0)
MCV: 101.4 fL — ABNORMAL HIGH (ref 80.0–100.0)
Platelets: 279 10*3/uL (ref 150–400)
RBC: 4.19 MIL/uL — ABNORMAL LOW (ref 4.22–5.81)
RDW: 13.3 % (ref 11.5–15.5)
WBC: 15.8 10*3/uL — ABNORMAL HIGH (ref 4.0–10.5)
nRBC: 0 % (ref 0.0–0.2)

## 2018-04-06 LAB — BASIC METABOLIC PANEL
ANION GAP: 11 (ref 5–15)
BUN: 43 mg/dL — ABNORMAL HIGH (ref 6–20)
CO2: 34 mmol/L — ABNORMAL HIGH (ref 22–32)
Calcium: 9 mg/dL (ref 8.9–10.3)
Chloride: 99 mmol/L (ref 98–111)
Creatinine, Ser: 0.76 mg/dL (ref 0.61–1.24)
GFR calc Af Amer: >60 mL/min (ref >60)
GFR calc non Af Amer: >60 mL/min (ref >60)
GLUCOSE: 174 mg/dL — AB (ref 70–99)
Potassium: 4.3 mmol/L (ref 3.5–5.1)
Sodium: 144 mmol/L (ref 135–145)

## 2018-04-06 LAB — GLUCOSE, CAPILLARY
Glucose-Capillary: 123 mg/dL — ABNORMAL HIGH (ref 70–99)
Glucose-Capillary: 160 mg/dL — ABNORMAL HIGH (ref 70–99)

## 2018-04-06 MED ORDER — MIDAZOLAM BOLUS VIA INFUSION
2.0000 mg | INTRAVENOUS | Status: DC | PRN
  Filled 2018-04-06: qty 10

## 2018-04-06 MED ORDER — SODIUM CHLORIDE 0.9 % IV SOLN
2.0000 mg/h | INTRAVENOUS | Status: DC
  Administered 2018-04-06 (×2): 10 mg/h via INTRAVENOUS
  Administered 2018-04-06: 2 mg/h via INTRAVENOUS
  Administered 2018-04-07 (×2): 9 mg/h via INTRAVENOUS
  Administered 2018-04-07: 10 mg/h via INTRAVENOUS
  Filled 2018-04-06 (×6): qty 10

## 2018-04-06 MED ORDER — HYDROMORPHONE BOLUS VIA INFUSION
1.0000 mg | INTRAVENOUS | Status: DC | PRN
  Filled 2018-04-06: qty 4

## 2018-04-06 MED ORDER — KETOROLAC TROMETHAMINE 15 MG/ML IJ SOLN: 15.0000 mg | Freq: Four times a day (QID) | INTRAMUSCULAR | Status: DC | PRN

## 2018-04-06 MED ORDER — ACETAMINOPHEN 650 MG RE SUPP: 650.0000 mg | Freq: Four times a day (QID) | RECTAL | Status: DC | PRN

## 2018-04-06 MED ORDER — ACETAMINOPHEN 160 MG/5ML PO SOLN
650.0000 mg | ORAL | Status: DC | PRN
  Administered 2018-04-06: 650 mg via ORAL
  Filled 2018-04-06: qty 20.3

## 2018-04-06 MED ORDER — LORAZEPAM 2 MG/ML IJ SOLN: 1.0000 mg | INTRAMUSCULAR | Status: DC | PRN

## 2018-04-06 MED ORDER — ACETAMINOPHEN 325 MG PO TABS
650.0000 mg | ORAL_TABLET | ORAL | Status: DC | PRN
  Administered 2018-04-06: 650 mg via ORAL
  Filled 2018-04-06: qty 2

## 2018-04-06 MED ORDER — CHLORHEXIDINE GLUCONATE CLOTH 2 % EX PADS
6.0000 | MEDICATED_PAD | Freq: Every day | CUTANEOUS | Status: DC
  Administered 2018-04-06 – 2018-04-07 (×2): 6 via TOPICAL

## 2018-04-06 MED ORDER — GLYCOPYRROLATE 0.2 MG/ML IJ SOLN: 0.2000 mg | Freq: Four times a day (QID) | INTRAMUSCULAR | Status: DC | PRN

## 2018-04-06 NOTE — Progress Notes (Signed)
NAME:  Brian [name], MRN:  [id number], DOB:  1955-08-06, LOS: 23 ADMISSION DATE:  03-17-2018, CONSULTATION DATE:  03/17/18 REFERRING MD:  Lamont Snowball - ED, CHIEF COMPLAINT:  Acute respiratory failure.   Brief Summary:  62 year old man who was intubated and transferred from [facility] for hypercapneic respiratory failure in the setting of exacerbation of COPD, Rhinovirus, proteus mirabilis / H. Influenzae pneumonia. New DVT on anticoagulation. Extubated but required reintubation for hypoxia.  Completed appropriate antibiotics.  Course complicated by ETOH withdrawal and agitated delirium.  No evidence of shunt on TEE.  S/P trach, cortrak.   Social hx elicited 03/28/18 - per RN - homeless, drug abuser, alcoholic  Past Medical History  ETOH Abuse Cardiomyopathy  Cocaine abuse  COPD (likely severe based on huge barrel chest) Homelessness  Medical non-compliance  Paroxysmal VT Suicide attempt  Tobacco Abuse  DVT   Significant Events:  11/29  Admit with hypercarbic resp fx in setting of PNA + AECOPD.  DVT+ 12/05 Precedex   12/07 HFNC 15L, interactive on precedex 12/08 Re-intubated for hypoxia (? atx vs PNA) 12/09 High peep/fio2. Slowly weaning 12/10 CT chest and bubble study as FIO2 and PEEP requirements seem out of proportion to CXR findings.  12/11 Right to left shunt (small via TTE). CT chest w/ bibasilar PNA; TEE ordered. Still not weaning much. TEE was neg for sig shunt. ABX stopped.  12/12 Trach placed. CXR stable. Still requiring high FIO2 and PEEP. Trach aspirate with proteus and H flu 12/13 Cortrack.  12/15 - Tmax 100.2.  I/O- 1L positive in last 24 hours, 10L positive for admit.  RN reports ongoing agitated delirium.  No acute events overnight.  12/16 - fent gtt titrated off. 60% fio3/peep 5+. Follows commands. Afebrile 12/17 - Febrile, vent settins weaned.  12/18 -On fent gtt, risperdan scheduled, nicotine patch, ativan prn and dialudid prn -> still Ongoing agitation  itermittently. CPT held. On 70% fio2 and peep 8 12/19 - 60% fio2, peep 8. Now on dilaudid gtt and precedex gtt with ativan prn, risperdal scheduled and nicotine patch. Still very agitated despite now dilaudid infuison and precedex. Trach changed to XLT 12/21- sedated on dilaudid and precedex. Will attempt an awakening trial. 12/24 remains supremely agitated at times despite dilaudid and dex infusions. Riseperdal and Depakote.  12/25 agitation improved 1226 - Comfortable on vent.  Sedated, vitals WNL 12/27 - 12.27 - 60% fio2, peep 8 - unchanged .sedated on vent. Goals of care for 04/05/18 - with pall care -> terminal wean at some point next fe days  Procedures  ETT 12/8 >>12/12  RUE PICC 12/9 >> Trach 12/12 >> changed to XLT 12/19 >>  Diagnostic tests:  DVT 11/29 > acute DVT involving the right common femoral vein, right femoral vein, right proximal profunda CT chest 12/10 > R>L PNA/consolidation left superior seg pulm nodules.  ECHO w/ bubble study 12/10 >> small right to left shunt.  Ejection fraction 40 to 45%, mild diffuse hypokinesis CTA chest 12/24 > No evidence PE, Bilateral effusion with persistent bilateral lower lobe consolidation. Persistent ATX in RML and lingula. Interval development of multifocal bilateral pulmonary nodules which are either ground-glass or part solid in appearance. Part solid nodule in the right upper lobe may have a central cavitary component.  Cultures:  Tracheal aspirate 12/2 >> proteus mirabilis >> pan sensitive                Haemophilus influenzae  Tracheal aspirate 12/8 >> neg BCx2 12/8 >> negative  RVP 11/29 >> rhinovirus   Antibiotics  Rocephin 12/2 >> 12/11  Interim history/subjective:    12/28 - 50% fio2, off restraints. On dilaudid gtt, precedex gtt 1.2, and scheduled klonopin, dilaudid tablet, zyprexa, valproic acid, and  risperdal -> able to tolerate bath yesterday without agitation  Objective   Blood pressure 124/66, pulse 66,  temperature 99.9 F (37.7 C), temperature source Axillary, resp. rate (!) 21, height 5\' 7"  (1.702 m), weight 81 kg, SpO2 (!) 88 %.    Vent Mode: PRVC FiO2 (%):  [50 %-60 %] 50 % Set Rate:  [18 bmp] 18 bmp Vt Set:  [510 mL] 510 mL PEEP:  [8 cmH20] 8 cmH20 Plateau Pressure:  [15 cmH20-17 cmH20] 17 cmH20   Intake/Output Summary (Last 24 hours) at 04/06/2018 1035 Last data filed at 04/06/2018 1000 Gross per 24 hour  Intake 2488.34 ml  Output 2975 ml  Net -486.66 ml   Filed Weights   04/04/18 0500 04/05/18 0446 04/06/18 0400  Weight: 81.6 kg 82.8 kg 81 kg    General Appearance:  Looks criticall ill OBESE - + Head:  Normocephalic, without obvious abnormality, atraumatic Eyes:  PERRL - yes, conjunctiva/corneas - muddy     Ears:  Normal external ear canals, both ears Nose:  G tube - yes Throat:  ETT TUBE - NO , OG tube - NO. TRACH + Neck:  Supple,  No enlargement/tenderness/nodules Lungs: Clear to auscultation bilaterally, Ventilator   Synchrony - yes Heart:  S1 and S2 normal, no murmur, CVP - x.  Pressors - no Abdomen:  Soft, no masses, no organomegaly Genitalia / Rectal:  Not done Extremities:  Extremities- intact Skin:  ntact in exposed areas . Sacral area - no examined Neurologic:  Sedation - many cns agents -> RASS - -4 . Moves all 4s - not tested. CAM-ICU - NA . Orientation - NA         Assessment & Plan: 29  Days LOS    Acute on chronic hypoxic Respiratory Failure s/p trach 03/21/18 -in setting of rhinovirus, proteus/H.influenza PNA, and likely AECOPD. There is also some concern for PE based on positive DVT. Also history of CHF with mild vascular congestion on CXR.  Bubble study also indicated a very small R>L shunt. Truthfully, its not clear that we fully understand why he continues to have a significant degree of hypoxia but possibly due to Pulmonary shunt from bilateral LL consolidation.    04/06/2018 - > does NOT meet criteria for SBT/Extubation in setting of Acute  on chronic Respiratory Failure due to severe hypoxemia and acute encephalopathy    PLAN - PRVC - VAP bundle  COPD NOS (but big barrel chest so likely severe) with chronic hypercarbic respiratory failure. - Bronchodilators  Pulmonary Nodules - Will need outpatient imaging in 3-6 months if goals of care changes to full code  Acute on Chronic Systolic Heart Failure-EF 35% : remains volume 1L pos on I&O for admission, which is an improvement, however, CXR continues to appear as volume up.  -Diuresis as tolerated -ACE inhibitor in future  Acute Encephalopathy / Agitated Delirium: very sensitive to medication changes (goes from comfortable to agitated and thrashing in bed)  12/28 - RASS -4  - Wean off precedex  - continue dilaudid gtt but stop dilaudid tab - Continue risperidone 1 mg twice daily - Continue Klonopin 1 mg every 6 hours - Continue Valproic acid currently at 500 mg every 8 - PRN Zyprexa for emergency use assuming QTC safe -  DC nicotine patch - Monitor QTc intervals - RASS score goal 0   R Femoral DVT with suspected pulmonary embolism. - Continue low molecular weight heparin - Monitor platelets and hemoglobin  Dysphagia.   - Continue tube feeding  Urinary retention - Continue foley, urecholine  Hyperglycemia - Sliding scale insulin if glucose consistently > 180  Goals of care / failure to thrive - possible terminal wean 04/06/18 or 04-09-2018 - pall care coordinating  Best practice   Nutrition: TF DVT: therapeutic LMWH Glucose: n/a Sedation: PAD PUD: Pepcid  Family Family - see pall care notes Dispo:  ICU.  LTAC ineliglble due to insurance     ATTESTATION & SIGNATURE   The patient GEORGIO HATTABAUGH is critically ill with multiple organ systems failure and requires high complexity decision making for assessment and support, frequent evaluation and titration of therapies, application of advanced monitoring technologies and extensive interpretation of  multiple databases.   Critical Care Time devoted to patient care services described in this note is  30  Minutes. This time reflects time of care of this signee Dr Kalman Shan. This critical care time does not reflect procedure time, or teaching time or supervisory time of PA/NP/Med student/Med Resident etc but could involve care discussion time     Dr. Kalman Shan, M.D., [facility].C.P Pulmonary and Critical Care Medicine Staff Physician [facility] Pager: [phone], If no answer or between  15:00h - 7:00h: call [phone]  04/06/2018 10:43 AM

## 2018-04-06 NOTE — Progress Notes (Signed)
Daily Progress Note   Patient Name: Brian [name]       Date: 04/06/2018 DOB: 1955/09/30  Age: 62 y.o. MRN#: [id number] Attending Physician: Brand Males, MD Primary Care Physician: Tawny Asal Admit Date: 02/14/2018  Reason for Consultation/Follow-up: Establishing goals of care, Non pain symptom management, Pain control, Psychosocial/spiritual support, Terminal Care and Withdrawal of life-sustaining treatment  Subjective: Pt seen, chart reviewed. [place] Lexine Baton and Sharyn Lull( primary spokesperson for pt and decision maker) at the bedside. Chaplain called for bedside prayer. Family has decided to withdraw ventilator support, pursue comfort care. Pt now DNR  Length of Stay: 29  Current Medications: Scheduled Meds:  . chlorhexidine gluconate (MEDLINE KIT)  15 mL Mouth Rinse BID  . Chlorhexidine Gluconate Cloth  6 each Topical Q0600    Continuous Infusions: . dexmedetomidine Stopped (04/06/18 1141)  . HYDROmorphone 1 mg/hr (04/06/18 1400)  . midazolam (VERSED) infusion      PRN Meds: acetaminophen, albuterol, HYDROmorphone, LORazepam, midazolam, ondansetron (ZOFRAN) IV, sodium chloride flush  Physical Exam Vitals signs and nursing note reviewed.  Constitutional:      Comments: Unresponsive unless sedation is decreased then rapidly becomes agitated and tachypneic  HENT:     Head: Normocephalic and atraumatic.  Neck:     Comments: trach Cardiovascular:     Pulses: Normal pulses.     Comments: 65/min Pulmonary:     Comments: Trach with full vent support Abdominal:     Comments: Cortrak  Genitourinary:    Comments: foley Neurological:     Comments: Unable to test  Psychiatric:     Comments: Agitated, tachypneic with reduction of sedation otherwise unable  to test             Vital Signs: BP (!) 105/58   Pulse 69   Temp (!) 102.5 F (39.2 C) (Oral)   Resp 20   Ht '5\' 7"'  (1.702 m)   Wt 81 kg   SpO2 (!) 89%   BMI 27.97 kg/m  SpO2: SpO2: (!) 89 % O2 Device: O2 Device: Ventilator O2 Flow Rate: O2 Flow Rate (L/min): 15 L/min  Intake/output summary:   Intake/Output Summary (Last 24 hours) at 04/06/2018 1500 Last data filed at 04/06/2018 1400 Gross per 24 hour  Intake 2183 ml  Output 2615 ml  Net -432 ml  LBM: Last BM Date: 04/03/18 Baseline Weight: Weight: 89.8 kg Most recent weight: Weight: 81 kg       Palliative Assessment/Data:    Flowsheet Rows     Most Recent Value  Intake Tab  Referral Department  Critical care  Unit at Time of Referral  ICU  Palliative Care Primary Diagnosis  Sepsis/Infectious Disease  Date Notified  03/28/18  Palliative Care Type  Return patient Palliative Care  Date of Admission  02/24/2018  Date first seen by Palliative Care  03/29/18  # of days Palliative referral response time  1 Day(s)  # of days IP prior to Palliative referral  20  Clinical Assessment  Palliative Performance Scale Score  30%  Pain Max last 24 hours  Not able to report  Pain Min Last 24 hours  Not able to report  Dyspnea Max Last 24 Hours  Not able to report  Dyspnea Min Last 24 hours  Not able to report  Nausea Max Last 24 Hours  Not able to report  Nausea Min Last 24 Hours  Not able to report  Anxiety Max Last 24 Hours  Not able to report  Anxiety Min Last 24 Hours  Not able to report  Other Max Last 24 Hours  Not able to report  Psychosocial & Spiritual Assessment  Palliative Care Outcomes  Patient/Family meeting held?  No  Palliative Care follow-up planned  Yes, Facility      Patient Active Problem List   Diagnosis Date Noted  . Ventilator dependence ([place])   . Agitation   . Pressure injury of skin 03/21/2018  . Status post tracheostomy ([place])   . Respiratory failure ([place]) 02/25/2018  . Precordial chest  pain   . Neck pain on left side 02/24/2018  . Left arm pain   . Left shoulder pain 02/23/2018  . Prolonged Q-T interval on ECG 01/06/2018  . Pain in right knee 01/06/2018  . Atypical chest pain 01/05/2018  . Hypokalemia 01/05/2018  . Acute on chronic combined systolic and diastolic CHF (congestive heart failure) ([place]) 01/05/2018  . Alcohol abuse 01/05/2018  . Pulmonary contusion 01/01/2018  . COPD exacerbation ([place]) 12/15/2017  . Acute respiratory failure ([place]) 11/13/2017  . Goals of care, counseling/discussion   . Palliative care by specialist   . Acute on chronic systolic CHF (congestive heart failure), NYHA class 4 ([place]) 11/03/2017  . Hyperglycemia 11/03/2017  . Acute respiratory failure with hypoxia and hypercapnia ([place]) 10/26/2017  . Acute on chronic respiratory failure with hypoxia ([place]) 09/27/2017  . Tobacco dependence 09/27/2017  . Chronic systolic CHF (congestive heart failure) ([place]) 09/25/2017  . COPD with acute exacerbation ([place]) 09/30/2015  . Substance induced mood disorder ([place]) 02/04/2013  . Polysubstance (excluding opioids) dependence, daily use ([place]) 02/03/2013  . Alcohol dependence ([place]) 02/03/2013  . Gout attack 11/12/2012  . Closed fracture of 5th metacarpal 11/12/2012  . Chest pain 08/27/2012  . Acute exacerbation of chronic obstructive pulmonary disease (COPD) ([place])   . Homelessness   . Cocaine abuse [facility])     Palliative Care Assessment & Plan   Patient Profile: 62 y.o. male  with past medical history of osteoarthritis, COPD, ischemic cardiomyopathy with an ejection fraction of 25% (ejection fraction in July was 30 to 35%), history of V. tach, history of homelessness, polysubstance use disorder admitted on 03/06/2018 with hypercapnic respiratory failure.  He was intubated and transferred from [facility] to [facility] for more intensive care.  He was extubated on 03/12/2018  but required reintubation on 03/15/2018.  Tracheostomy was performed on  03/21/2018.  At this point, he is unable to use a [employer] valve.  His hospitalization has been complicated by alcohol withdrawal, agitation.  He remains on Precedex secondary to agitation as well as restraints.  Patient has been seen by palliative medicine team in July 2019 and at that time he had verbalized that he would want full scope of treatment were his condition to decline.  He is now a full code. Unfortunately, despite full aggressive support pt has been unable to wean off ventilator support to trach  and family feels that pt would not want to live on life support. They are now opting for comfort care, withdrawal of ventilator support, DC cortrak, non-comfort care related RX    Recommendations/Plan:   Agitation with associated tachypnea has been difficult to control. Pt has been unable to wean off precedex without distress. Will continue precedex 1.2   Cont dilaudid infusion with titration range up to 8 mg/hr with bolus q30 min  Add versed continuous infusion 2-10/hr basal and bolus 2-10 PRN  Add robinul 0.4 q4 PRN  Continue ativan PRN  RN may pronounce  Goals of Care and Additional Recommendations:  Limitations on Scope of Treatment: Full Comfort Care  Code Status:    Code Status Orders  (From admission, onward)         Start     Ordered   04/06/18 1425  Do not attempt resuscitation (DNR)  Continuous    Question Answer Comment  In the event of cardiac or respiratory ARREST Do not call a "code blue"   In the event of cardiac or respiratory ARREST Do not perform Intubation, CPR, defibrillation or ACLS   In the event of cardiac or respiratory ARREST Use medication by any route, position, wound care, and other measures to relive pain and suffering. May use oxygen, suction and manual treatment of airway obstruction as needed for comfort.      04/06/18 1424        Code Status History    Date Active Date Inactive Code Status Order ID Comments User Context    04/05/2018 1623 04/06/2018 1424 Partial Code [id number]  Dory Horn, NP Inpatient   02/14/2018 1044 04/05/2018 1623 Full Code [id number]  Marisa Sprinkles, MD Inpatient   02/24/2018 0208 02/26/2018 1351 Full Code [id number]  Ivor Costa, MD ED   01/05/2018 2247 01/07/2018 1802 Full Code [id number]  Reubin Milan, MD Inpatient   01/05/2018 2058 01/05/2018 2247 Full Code [id number]  Reubin Milan, MD ED   01/01/2018 0329 01/03/2018 1810 Full Code [id number]  Harrie Foreman, MD Inpatient   12/15/2017 1726 12/18/2017 1747 Full Code [id number]  Cristal Deer, MD ED   11/13/2017 0439 11/16/2017 1312 Full Code [id number]  Rise Patience, MD ED   11/03/2017 1553 11/06/2017 1712 Full Code [id number]  Lady Deutscher, MD ED   10/26/2017 2338 10/28/2017 2243 Full Code [id number]  Hugelmeyer, Alexis, DO ED   10/14/2017 2207 10/16/2017 1644 Full Code [id number]  Vianne Bulls, MD ED   09/27/2017 1052 10/01/2017 1615 Full Code [id number]  Karmen Bongo, MD ED   09/25/2017 0504 09/26/2017 1633 Full Code [id number]  Vianne Bulls, MD ED   10/20/2015 0727 10/22/2015 1705 Full Code [id number]  Rise Patience, MD Inpatient   09/30/2015 2027 10/07/2015 1542 Full Code [id number]  Truett Mainland, DO Inpatient   02/03/2013 1543 02/08/2013 1137 Full Code [id number]  Waylan Boga, NP Inpatient   02/03/2013 0347 02/03/2013 1543 Full Code [id number]  Martie Lee, PA-C ED   11/10/2012 2026 11/12/2012 1654 Full Code [id number]  Jonetta Osgood, MD Inpatient   08/07/2012 0158 08/08/2012 1554 Full Code [id number]  Verl Dicker, PA-C ED   06/02/2012 2032 06/04/2012 0014 Full Code [id number]  Delaine Lame ED   04/20/2011 0506 04/22/2011 1428 Full Code [id number]  Mariel Craft, RN ED   03/22/2011 0600 03/22/2011 2315 Full Code [id number]  Chauncy Passy, MD ED       Prognosis:   Hours - Days I the setting of withdrawal of ventilator support to trach, acute resp failure with inability to wean  Discharge  Planning:  Anticipated Hospital Death  Care plan was discussed with Dr. Rowe Pavy, Jinny Blossom, Pharm-D, Dr. Chase Caller  Thank you for allowing the Palliative Medicine Team to assist in the care of this patient.   Time In: 1400 Time Out: 1530 Total Time 90 min Prolonged Time Billed  yes       Greater than 50%  of this time was spent counseling and coordinating care related to the above assessment and plan.  Dory Horn, NP  Please contact Palliative Medicine Team phone at [phone] for questions and concerns.

## 2018-04-06 NOTE — Progress Notes (Signed)
RT note: Removed patient from vent and placed on trach collar per MD order.

## 2018-04-06 NOTE — Progress Notes (Signed)
   04/06/18 1600  Clinical Encounter Type  Visited With Patient and family together;Health care provider  Visit Type Initial;Spiritual support;Psychological support;Patient actively dying  Referral From Palliative care team  Spiritual Encounters  Spiritual Needs Emotional;Grief support;Prayer  Stress Factors  Family Stress Factors Major life changes;Loss of control   Responded to pg from NP S. Bullard of PMT as pt was soon to be removed from ventilator.  Met w/ and prayed w/ 2 nieces, pt, and NP and RN.  Encouraged nieces to speak anything they wished to tell their uncle. Pls pg chaplain to return if family desires.  Myra Gianotti resident, [phone]

## 2018-04-07 DIAGNOSIS — R0602 Shortness of breath: Secondary | ICD-10-CM

## 2018-04-07 LAB — GLUCOSE, CAPILLARY
Glucose-Capillary: 143 mg/dL — ABNORMAL HIGH (ref 70–99)
Glucose-Capillary: 124 mg/dL — ABNORMAL HIGH (ref 70–99)

## 2018-04-10 NOTE — Plan of Care (Signed)
  Problem: Health Behavior/Discharge Planning: Goal: Ability to manage health-related needs will improve Outcome: Completed/Met   Problem: Clinical Measurements: Goal: Ability to maintain clinical measurements within normal limits will improve Outcome: Completed/Met Goal: Will remain free from infection Outcome: Completed/Met Goal: Diagnostic test results will improve Outcome: Completed/Met Goal: Respiratory complications will improve Outcome: Completed/Met Goal: Cardiovascular complication will be avoided Outcome: Completed/Met   Problem: Activity: Goal: Risk for activity intolerance will decrease Outcome: Completed/Met   Problem: Nutrition: Goal: Adequate nutrition will be maintained Outcome: Completed/Met   Problem: Coping: Goal: Level of anxiety will decrease Outcome: Completed/Met   Problem: Elimination: Goal: Will not experience complications related to bowel motility Outcome: Completed/Met Goal: Will not experience complications related to urinary retention Outcome: Completed/Met   Problem: Pain Managment: Goal: General experience of comfort will improve Outcome: Completed/Met   Problem: Skin Integrity: Goal: Risk for impaired skin integrity will decrease Outcome: Completed/Met   Problem: Activity: Goal: Ability to tolerate increased activity will improve Outcome: Completed/Met   Problem: Role Relationship: Goal: Method of communication will improve Outcome: Completed/Met

## 2018-04-10 NOTE — Progress Notes (Addendum)
NAME:  Brian [name], MRN:  [id number], DOB:  11-21-1955, LOS: 23 ADMISSION DATE:  09-Mar-2018, CONSULTATION DATE:  2018/03/09 REFERRING MD:  Lamont Snowball - ED, CHIEF COMPLAINT:  Acute respiratory failure.   Brief Summary:  63 year old man who was intubated and transferred from [facility] for hypercapneic respiratory failure in the setting of exacerbation of COPD, Rhinovirus, proteus mirabilis / H. Influenzae pneumonia. New DVT on anticoagulation. Extubated but required reintubation for hypoxia.  Completed appropriate antibiotics.  Course complicated by ETOH withdrawal and agitated delirium.  No evidence of shunt on TEE.  S/P trach, cortrak.   Social hx elicited 03/28/18 - per RN - homeless, drug abuser, alcoholic  Past Medical History  ETOH Abuse Cardiomyopathy  Cocaine abuse  COPD (likely severe based on huge barrel chest) Homelessness  Medical non-compliance  Paroxysmal VT Suicide attempt  Tobacco Abuse  DVT   Significant Events:  11/29  Admit with hypercarbic resp fx in setting of PNA + AECOPD.  DVT+ 12/05 Precedex   12/07 HFNC 15L, interactive on precedex 12/08 Re-intubated for hypoxia (? atx vs PNA) 12/09 High peep/fio2. Slowly weaning 12/10 CT chest and bubble study as FIO2 and PEEP requirements seem out of proportion to CXR findings.  12/11 Right to left shunt (small via TTE). CT chest w/ bibasilar PNA; TEE ordered. Still not weaning much. TEE was neg for sig shunt. ABX stopped.  12/12 Trach placed. CXR stable. Still requiring high FIO2 and PEEP. Trach aspirate with proteus and H flu 12/13 Cortrack.  12/15 - Tmax 100.2.  I/O- 1L positive in last 24 hours, 10L positive for admit.  RN reports ongoing agitated delirium.  No acute events overnight.  12/16 - fent gtt titrated off. 60% fio3/peep 5+. Follows commands. Afebrile 12/17 - Febrile, vent settins weaned.  12/18 -On fent gtt, risperdan scheduled, nicotine patch, ativan prn and dialudid prn -> still Ongoing agitation  itermittently. CPT held. On 70% fio2 and peep 8 12/19 - 60% fio2, peep 8. Now on dilaudid gtt and precedex gtt with ativan prn, risperdal scheduled and nicotine patch. Still very agitated despite now dilaudid infuison and precedex. Trach changed to XLT 12/21- sedated on dilaudid and precedex. Will attempt an awakening trial. 12/24 remains supremely agitated at times despite dilaudid and dex infusions. Riseperdal and Depakote.  12/25 agitation improved 1226 - Comfortable on vent.  Sedated, vitals WNL 12/27 - 12.27 - 60% fio2, peep 8 - unchanged .sedated on vent. Goals of care for 04/05/18 - with pall care -> terminal wean at some point next fe days  12/28 - 50% fio2, off restraints. On dilaudid gtt, precedex gtt 1.2, and scheduled klonopin, dilaudid tablet, zyprexa, valproic acid, and  risperdal -> able to tolerate bath yesterday without agitation. Terminal wean started  Procedures  ETT 12/8 >>12/12  RUE PICC 12/9 >> Trach 12/12 >> changed to XLT 12/19 >>  Diagnostic tests:  DVT 11/29 > acute DVT involving the right common femoral vein, right femoral vein, right proximal profunda CT chest 12/10 > R>L PNA/consolidation left superior seg pulm nodules.  ECHO w/ bubble study 12/10 >> small right to left shunt.  Ejection fraction 40 to 45%, mild diffuse hypokinesis CTA chest 12/24 > No evidence PE, Bilateral effusion with persistent bilateral lower lobe consolidation. Persistent ATX in RML and lingula. Interval development of multifocal bilateral pulmonary nodules which are either ground-glass or part solid in appearance. Part solid nodule in the right upper lobe may have a central cavitary component.  Cultures:  Tracheal  aspirate 12/2 >> proteus mirabilis >> pan sensitive                Haemophilus influenzae  Tracheal aspirate 12/8 >> neg BCx2 12/8 >> negative  RVP 11/29 >> rhinovirus   Antibiotics  Rocephin 12/2 >> 12/11  Interim history/subjective:   12/29 - off vent. On sedation gtt.  RASS -4/-5 even without precedex. Pusle ox 60s. Mottled. Now in terminal care mode. ATC +.   Objective   Blood pressure 111/61, pulse (!) 104, temperature 99.9 F (37.7 C), temperature source Oral, resp. rate (!) 29, height 5\' 7"  (1.702 m), weight 81 kg, SpO2 (!) 46 %.    Vent Mode: PRVC FiO2 (%):  [28 %-50 %] 28 % Set Rate:  [18 bmp] 18 bmp Vt Set:  [510 mL] 510 mL PEEP:  [8 cmH20] 8 cmH20 Plateau Pressure:  [17 cmH20] 17 cmH20   Intake/Output Summary (Last 24 hours) at 03/18/2018 1009 Last data filed at 03/14/2018 0800 Gross per 24 hour  Intake 914.47 ml  Output 1550 ml  Net -635.53 ml   Filed Weights   04/04/18 0500 04/05/18 0446 04/06/18 0400  Weight: 81.6 kg 82.8 kg 81 kg    Bearded male Moribund looking Mottled Comatose ATC + Abd soft          Assessment & Plan: 30  Days LOS    Acute on chronic hypoxic Respiratory Failure s/p trach 03/21/18 -in setting of rhinovirus, proteus/H.influenza PNA, and likely AECOPD. There is also some concern for PE based on positive DVT. Also history of CHF with mild vascular congestion on CXR.  Bubble study also indicated a very small R>L shunt. Truthfully, its not clear that we fully understand why he continues to have a significant degree of hypoxia but possibly due to Pulmonary shunt from bilateral LL consolidation.    Pulmonary Nodules  Acute on Chronic Systolic Heart Failure-EF 35% :    Acute Encephalopathy / Agitated Delirium: - _ Obtundation with comfort care measures   R Femoral DVT with suspected pulmonary embolism.   Terminal Care   - terminal care with palliative care.  - life expectancy is a few days - move to 6N palliative floor  - will ask [facility] to be primary from 04/08/18 with ccm off - d.w Dr Caleb PoppNettey Rayna Sexton([place]) - [facility] primary from 04/08/18    Best practice   Nutrition: NPO    SIGNATURE    Dr. Kalman ShanMurali Tyisha Cressy, M.D., F.C.C.P,  Pulmonary and Critical Care Medicine Staff Physician, [facility] Center Director - Interstitial Lung Disease  Program  Pulmonary Fibrosis [facility] [place], [place], [id number]  Pager: [phone], If no answer or between  15:00h - 7:00h: call [phone] Telephone: [phone]  10:13 AM 03/27/2018

## 2018-04-10 NOTE — Progress Notes (Signed)
Daily Progress Note   Patient Name: Brian [name]       Date: 05-04-18 DOB: September 10, 1955  Age: 63 y.o. MRN#: [id number] Attending Physician: Brand Males, MD Primary Care Physician: Tawny Asal Admit Date: 02/28/2018  Reason for Consultation/Follow-up: Non pain symptom management, Pain control, Psychosocial/spiritual support, Terminal Care and Withdrawal of life-sustaining treatment  Subjective: Patient seen, chart reviewed.  No family currently at the bedside but per RN, family has been at the bedside all night.  Patient appears comfortable; no grimacing.  His respiratory rate has remained in the upper 20s low 30s throughout the night.  He has belly breathing at this point but no agonal respirations.  There is been no agitation.  Faint mottling to feet and knees noted.  His extremities are cool.  Sats have remained in the 40s and 50s throughout the night  Length of Stay: 30  Current Medications: Scheduled Meds:  . chlorhexidine gluconate (MEDLINE KIT)  15 mL Mouth Rinse BID  . Chlorhexidine Gluconate Cloth  6 each Topical Q0600    Continuous Infusions: . dexmedetomidine 1.2 mcg/kg/hr (May 04, 2018 0802)  . HYDROmorphone 8 mg/hr (05/04/18 0801)  . midazolam (VERSED) infusion 9 mg/hr (05/04/18 0713)    PRN Meds: acetaminophen, albuterol, glycopyrrolate, HYDROmorphone, ketorolac, LORazepam, midazolam, ondansetron (ZOFRAN) IV, sodium chloride flush  Physical Exam Vitals signs and nursing note reviewed.  Constitutional:      Comments: Patient actively dying No grimacing patient appears comfortable  HENT:     Head: Normocephalic and atraumatic.  Neck:     Comments: Trach Cardiovascular:     Comments: Heart rate in the low 100s Pulmonary:     Comments:  Respiratory rate has been in the 30s overnight Using accessory muscles to breathe Mild upper airway secretions Genitourinary:    Comments: [place] Skin:    Comments: Cool Faint mottling to knees and feet  Neurological:     Comments: Patient is unresponsive to voice and touch  Psychiatric:     Comments: No overt agitation otherwise unable to test             Vital Signs: BP 111/61   Pulse (!) 104   Temp 99.9 F (37.7 C) (Oral)   Resp (!) 29   Ht _0  (1.702 m)   Wt 81 kg   SpO2 Marland Kitchen)  46% Comment: comfort care  BMI 27.97 kg/m  SpO2: SpO2: (!) 46 %(comfort care) O2 Device: O2 Device: Tracheostomy Collar O2 Flow Rate: O2 Flow Rate (L/min): 5 L/min  Intake/output summary:   Intake/Output Summary (Last 24 hours) at May 03, 2018 0826 Last data filed at 03-May-2018 0700 Gross per 24 hour  Intake 1037.14 ml  Output 1690 ml  Net -652.86 ml   LBM: Last BM Date: 04/03/18 Baseline Weight: Weight: 89.8 kg Most recent weight: Weight: 81 kg       Palliative Assessment/Data:    Flowsheet Rows     Most Recent Value  Intake Tab  Referral Department  Critical care  Unit at Time of Referral  ICU  Palliative Care Primary Diagnosis  Sepsis/Infectious Disease  Date Notified  03/28/18  Palliative Care Type  Return patient Palliative Care  Date of Admission  03/03/2018  Date first seen by Palliative Care  03/29/18  # of days Palliative referral response time  1 Day(s)  # of days IP prior to Palliative referral  20  Clinical Assessment  Palliative Performance Scale Score  30%  Pain Max last 24 hours  Not able to report  Pain Min Last 24 hours  Not able to report  Dyspnea Max Last 24 Hours  Not able to report  Dyspnea Min Last 24 hours  Not able to report  Nausea Max Last 24 Hours  Not able to report  Nausea Min Last 24 Hours  Not able to report  Anxiety Max Last 24 Hours  Not able to report  Anxiety Min Last 24 Hours  Not able to report  Other Max Last 24 Hours  Not able to report    Psychosocial & Spiritual Assessment  Palliative Care Outcomes  Patient/Family meeting held?  No  Palliative Care follow-up planned  Yes, Facility      Patient Active Problem List   Diagnosis Date Noted  . Acute respiratory distress   . Ventilator dependence ([place])   . Agitation   . Pressure injury of skin 03/21/2018  . Status post tracheostomy ([place])   . Respiratory failure ([place]) 02/12/2018  . Precordial chest pain   . Neck pain on left side 02/24/2018  . Left arm pain   . Left shoulder pain 02/23/2018  . Prolonged Q-T interval on ECG 01/06/2018  . Pain in right knee 01/06/2018  . Atypical chest pain 01/05/2018  . Hypokalemia 01/05/2018  . Acute on chronic combined systolic and diastolic CHF (congestive heart failure) ([place]) 01/05/2018  . Alcohol abuse 01/05/2018  . Pulmonary contusion 01/01/2018  . COPD exacerbation ([place]) 12/15/2017  . Acute respiratory failure ([place]) 11/13/2017  . Goals of care, counseling/discussion   . Palliative care by specialist   . Acute on chronic systolic CHF (congestive heart failure), NYHA class 4 ([place]) 11/03/2017  . Hyperglycemia 11/03/2017  . Acute respiratory failure with hypoxia and hypercapnia ([place]) 10/26/2017  . Acute on chronic respiratory failure with hypoxia ([place]) 09/27/2017  . Tobacco dependence 09/27/2017  . Chronic systolic CHF (congestive heart failure) ([place]) 09/25/2017  . COPD with acute exacerbation ([place]) 09/30/2015  . Substance induced mood disorder ([place]) 02/04/2013  . Polysubstance (excluding opioids) dependence, daily use ([place]) 02/03/2013  . Alcohol dependence ([place]) 02/03/2013  . Gout attack 11/12/2012  . Closed fracture of 5th metacarpal 11/12/2012  . Chest pain 08/27/2012  . Acute exacerbation of chronic obstructive pulmonary disease (COPD) ([place])   . Homelessness   . Cocaine abuse [facility])     Palliative Care Assessment &  Plan   Patient Profile: 63 y.o.malewith past medical history of osteoarthritis, COPD, ischemic  cardiomyopathy with an ejection fraction of 25% (ejection fraction in July was 30 to 35%), history of V. tach, history of homelessness, polysubstance use disorderadmitted on11/29/2019with hypercapnic respiratory failure. He was intubated and [facility] for more intensive care. He was extubated on 03/12/2018 but required reintubation on 03/15/2018. Tracheostomy was performed on 03/21/2018. At this point, he is unable to use a [employer] valve. His hospitalization has been complicated by alcohol withdrawal, agitation. He remains on Precedex secondary to agitation as well as restraints.  Patient has been seen by palliative medicine team in July 2019 and at that time he had verbalized that he would want full scope of treatmentwerehis condition to decline. He is now a full code. Unfortunately, despite full aggressive support pt has been unable to wean off ventilator support to trach  and family feels that pt would not want to live on life support. They are now opting for comfort care, withdrawal of ventilator support, DC cortrak, non-comfort care related RX   Patient is actively dying when seen on 12/29.  Family has been at the bedside throughout the night   Recommendations/Plan:  Trial of stopping Precedex.  If patient can remain comfortable, this will enable him to transfer to 6 N. which might be more comfortable to family to be with him as he nears end of life.  We will continue to monitor before transfer order placed and discussed with family  Addendum 1000: No distress off precedex. Will DC  Continue with Dilaudid and Versed continuous infusions  Anticipate that patient will probably pass today    Goals of Care and Additional Recommendations:  Limitations on Scope of Treatment: Full Comfort Care  Code Status:    Code Status Orders  (From admission, onward)         Start     Ordered   04/06/18 1425  Do not attempt  resuscitation (DNR)  Continuous    Question Answer Comment  In the event of cardiac or respiratory ARREST Do not call a "code blue"   In the event of cardiac or respiratory ARREST Do not perform Intubation, CPR, defibrillation or ACLS   In the event of cardiac or respiratory ARREST Use medication by any route, position, wound care, and other measures to relive pain and suffering. May use oxygen, suction and manual treatment of airway obstruction as needed for comfort.      04/06/18 1424        Code Status History    Date Active Date Inactive Code Status Order ID Comments User Context   04/05/2018 1623 04/06/2018 1424 Partial Code [id number]  Dory Horn, NP Inpatient   03/01/2018 1044 04/05/2018 1623 Full Code [id number]  Marisa Sprinkles, MD Inpatient   02/24/2018 0208 02/26/2018 1351 Full Code [id number]  Ivor Costa, MD ED   01/05/2018 2247 01/07/2018 1802 Full Code [id number]  Reubin Milan, MD Inpatient   01/05/2018 2058 01/05/2018 2247 Full Code [id number]  Reubin Milan, MD ED   01/01/2018 0329 01/03/2018 1810 Full Code [id number]  Harrie Foreman, MD Inpatient   12/15/2017 1726 12/18/2017 1747 Full Code [id number]  Cristal Deer, MD ED   11/13/2017 0439 11/16/2017 1312 Full Code [id number]  Rise Patience, MD ED   11/03/2017 1553 11/06/2017 1712 Full Code [id number]  Lady Deutscher, MD ED   10/26/2017 2338 10/28/2017 2243 Full Code [id number]  [place], [place], [place] ED   10/14/2017 2207 10/16/2017 1644 Full Code [id number]  Vianne Bulls, MD ED   09/27/2017 1052 10/01/2017 1615 Full Code [id number]  Karmen Bongo, MD ED   09/25/2017 0504 09/26/2017 1633 Full Code [id number]  Vianne Bulls, MD ED   10/20/2015 0727 10/22/2015 1705 Full Code [id number]  Rise Patience, MD Inpatient   09/30/2015 2027 10/07/2015 1542 Full Code [id number]  Truett Mainland, DO Inpatient   02/03/2013 1543 02/08/2013 1137 Full Code [id number]  Waylan Boga, NP Inpatient   02/03/2013 0347 02/03/2013 1543  Full Code [id number]  Martie Lee, PA-C ED   11/10/2012 2026 11/12/2012 1654 Full Code [id number]  Jonetta Osgood, MD Inpatient   08/07/2012 0158 08/08/2012 1554 Full Code [id number]  Verl Dicker, PA-C ED   06/02/2012 2032 06/04/2012 0014 Full Code [id number]  Montine Circle, PA-C ED   04/20/2011 0506 04/22/2011 1428 Full Code [id number]  Mariel Craft, RN ED   03/22/2011 0600 03/22/2011 2315 Full Code [id number]  Chauncy Passy, MD ED       Prognosis:   Hours - Days  Discharge Planning:  Anticipated Hospital Death  Care plan was discussed with bedside RN, Lennox Grumbles  Thank you for allowing the Palliative Medicine Team to assist in the care of this patient.   Time In: 0800 Time Out: 0840 Total Time 40 min Prolonged Time Billed  no       Greater than 50%  of this time was spent counseling and coordinating care related to the above assessment and plan.  Dory Horn, NP  Please contact Palliative Medicine Team phone at [phone] for questions and concerns.

## 2018-04-10 NOTE — Progress Notes (Signed)
Patient transported to morgue Family aware and undecided on funeral home/arrangments.

## 2018-04-10 NOTE — Progress Notes (Signed)
Patient expired, family at bedside. MD notified, Bed placement and [facility] notified waiting for family to give [facility] information.   Chelsey Kimberley, Kae Heller, RN

## 2018-04-10 DEATH — deceased

## 2018-04-15 ENCOUNTER — Telehealth: Payer: Self-pay

## 2018-04-15 NOTE — Telephone Encounter (Signed)
On 04/15/2018 I received a dc from [employer] (original DC is for cremation. Patient is a patient of Doctor Ramaswamy.  DC will be taken to Pulmonary Unit for signature.

## 2018-04-17 ENCOUNTER — Telehealth: Payer: Self-pay | Admitting: *Deleted

## 2018-04-17 NOTE — Telephone Encounter (Signed)
Received D/C from provider-[facility]eral Home notified for pick along with a copy being faxed.

## 2018-05-11 NOTE — Discharge Summary (Signed)
DISCHARGE SUMMARY    Date of admit: 02/10/2018 10:24 AM Date of discharge: 2018/04/23  6:59 PM Length of Stay: 30 days  PCP is [name], PA-C   PROBLEM LIST Active Problems:   Goals of care, counseling/discussion   Acute respiratory failure (HCC) on chronic respiratory failure due to copd exacerbation and pnuemonia   Status post tracheostomy (HCC)   Ventilator dependence (HCC)   Pressure injury of skin  Agitation     SUMMARY Brian [name] was 63 y.o. patient with    has a past medical history of Alcohol abuse, Arthritis, Cardiomyopathy (HCC), CHF (congestive heart failure) (HCC), Cocaine abuse (HCC), COPD (chronic obstructive pulmonary disease) (HCC), Heart failure, Homelessness, Noncompliance, Paroxysmal VT (HCC), Suicide attempt (HCC), and Tobacco abuse.   has a past surgical history that includes Inner ear surgery and LEFT HEART CATH AND CORONARY ANGIOGRAPHY (N/A, 02/25/2018).   Admitted on 02/09/2018 with  63 year old man who was intubated and transferred from [facility] for hypercapneic respiratory failure in the setting of exacerbation of COPD, Rhinovirus, proteus mirabilis / H. Influenzae pneumonia. New DVT on anticoagulation. Extubated but required reintubation for hypoxia.  Completed appropriate antibiotics.  Course complicated by ETOH withdrawal and agitated delirium.  No evidence of shunt on TEE.  S/P trach, cortrak.   Social hx elicited 03/28/18 - per RN - homeless, drug abuser, alcoholic  Significant Events:  11/29  Admit with hypercarbic resp fx in setting of PNA + AECOPD.  DVT+ 12/05 Precedex   12/07 HFNC 15L, interactive on precedex 12/08 Re-intubated for hypoxia (? atx vs PNA) 12/09 High peep/fio2. Slowly weaning 12/10 CT chest and bubble study as FIO2 and PEEP requirements seem out of proportion to CXR findings.  12/11 Right to left shunt (small via TTE). CT chest w/ bibasilar PNA; TEE ordered. Still not weaning much. TEE was neg for sig  shunt. ABX stopped.  12/12 Trach placed. CXR stable. Still requiring high FIO2 and PEEP. Trach aspirate with proteus and H flu 12/13 Cortrack.  12/15 - Tmax 100.2.  I/O- 1L positive in last 24 hours, 10L positive for admit.  RN reports ongoing agitated delirium.  No acute events overnight.  12/16 - fent gtt titrated off. 60% fio3/peep 5+. Follows commands. Afebrile 12/17 - Febrile, vent settins weaned.  12/18 -On fent gtt, risperdan scheduled, nicotine patch, ativan prn and dialudid prn -> still Ongoing agitation itermittently. CPT held. On 70% fio2 and peep 8 12/19 - 60% fio2, peep 8. Now on dilaudid gtt and precedex gtt with ativan prn, risperdal scheduled and nicotine patch. Still very agitated despite now dilaudid infuison and precedex. Trach changed to XLT 12/21- sedated on dilaudid and precedex. Will attempt an awakening trial. 12/24 remains supremely agitated at times despite dilaudid and dex infusions. Riseperdal and Depakote.  12/25 agitation improved 1226 - Comfortable on vent.  Sedated, vitals WNL 12/27 - 12.27 - 60% fio2, peep 8 - unchanged .sedated on vent. Goals of care for 04/05/18 - with pall care -> terminal wean at some point next fe days  12/28 - 50% fio2, off restraints. On dilaudid gtt, precedex gtt 1.2, and scheduled klonopin, dilaudid tablet, zyprexa, valproic acid, and  risperdal -> able to tolerate bath yesterday without agitation. Terminal wean started   Apr 23, 2018 - expired    SIGNED Dr. Kalman Shan, M.D., [facility].C.P Pulmonary and Critical Care Medicine Staff Physician  System [place] [facility] Pager: [phone], If no answer or between  15:00h - 7:00h:  call [phone]  04/22/2018 10:35 AM

## 2020-03-07 IMAGING — DX DG ABDOMEN 1V
1 series · 2 of 2 positions shown · non-contrast
Comparison: No comparison abdominal films. Comparison chest x-ray
12/15/2017.

CLINICAL DATA: 61-year-old male with generalized abdominal pain.
Initial encounter.

EXAM:
ABDOMEN - 1 VIEW

[Series 1: abdomen · 0.14mm/px · 2 of 2 slices shown]
[im 1/2]
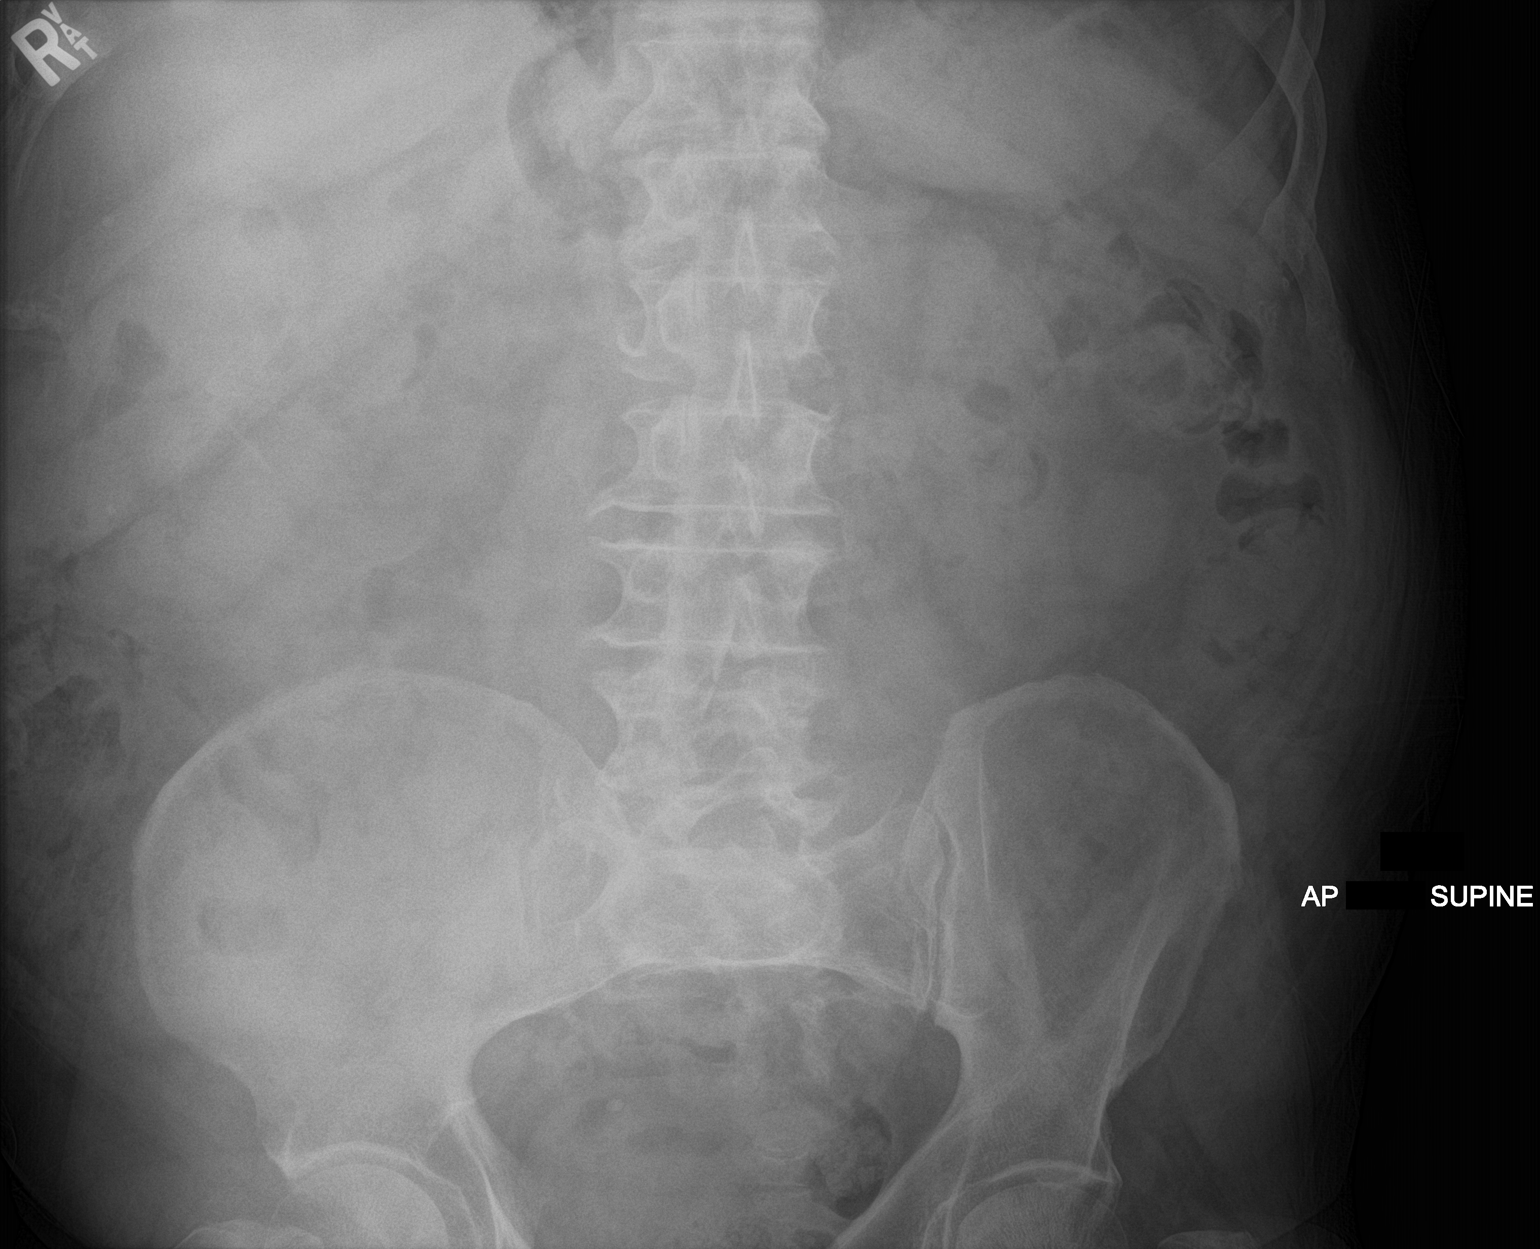
[im 2/2]
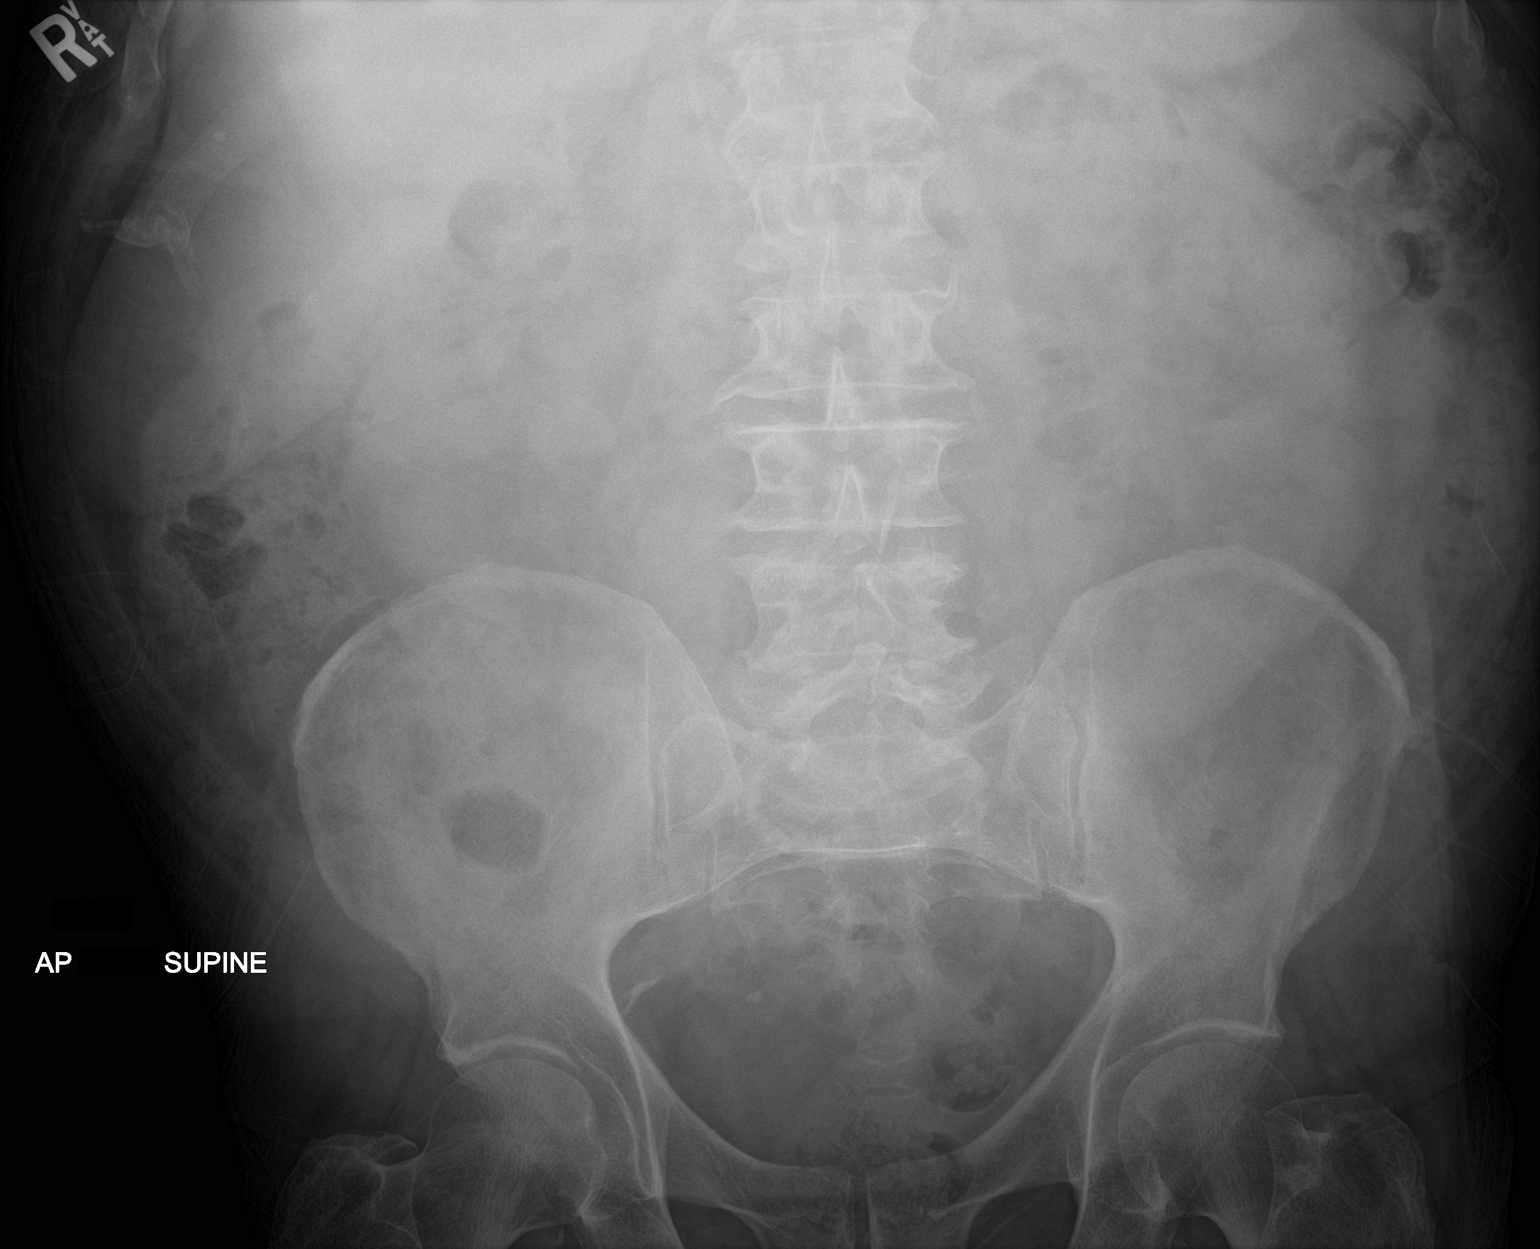

[2 of 2 positions shown; findings below may reference images not displayed]

FINDINGS: Moderate stool throughout the colon. No evidence of small-bowel
obstruction. The possibility of free intraperitoneal air cannot be
assessed on a supine view.
IMPRESSION: Moderate stool throughout the colon.

## 2020-05-14 IMAGING — MR MR CERVICAL SPINE W/O CM
5 series · 37 of 48 positions shown · non-contrast
Comparison: None.

CLINICAL DATA: Neck pain and left arm pain. Left finger tingling
for 2 weeks.

EXAM:
MRI CERVICAL SPINE WITHOUT CONTRAST
TECHNIQUE: Multiplanar, multisequence MR imaging of the cervical spine was
performed. No intravenous contrast was administered.

[Series 9: T2 · sagittal · 3.0mm · 0.69mm/px · 6 of 15 slices shown (1 of 2)]
[im 1/15]
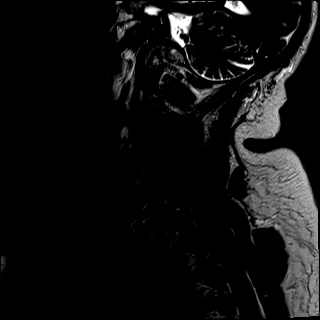
[im 3/15]
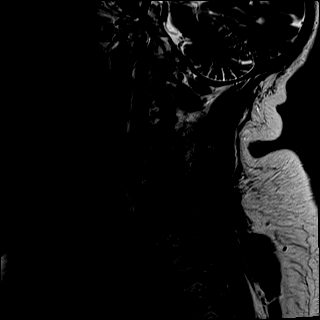
[im 6/15]
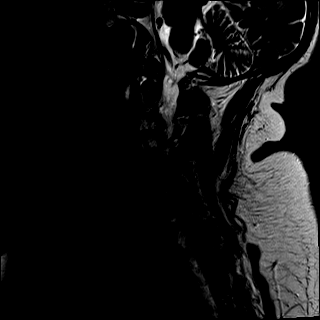
[im 9/15]
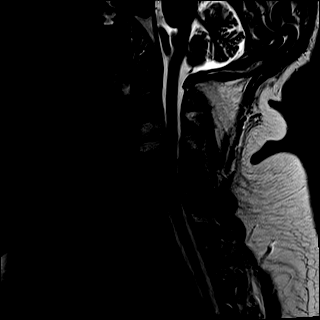
[im 12/15]
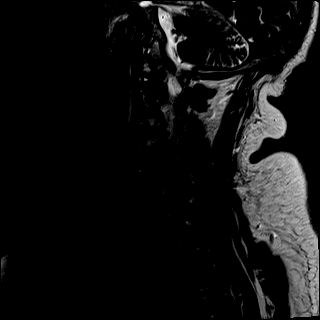
[im 15/15]
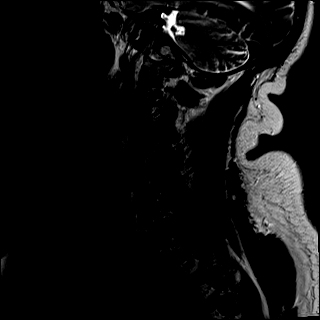

[Series 10: T1 · sagittal · 3.0mm · 0.69mm/px · 6 of 15 slices shown]
[im 1/15]
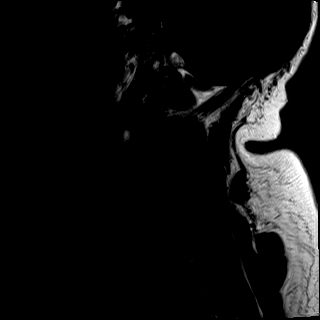
[im 3/15]
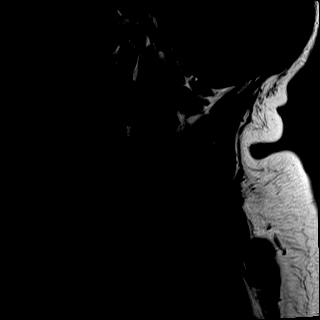
[im 6/15]
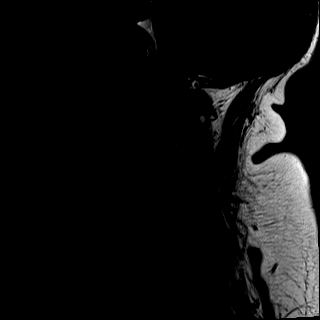
[im 9/15]
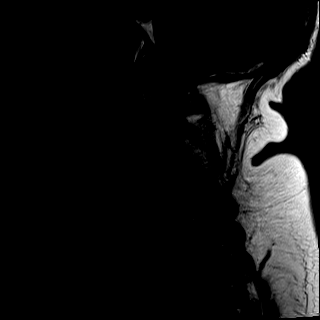
[im 12/15]
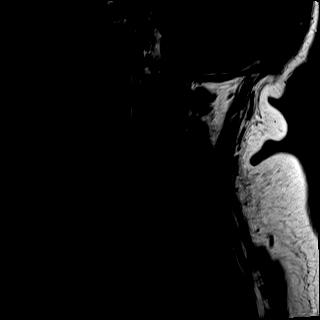
[im 15/15]
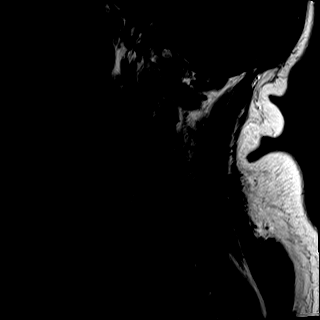

[Series 11: STIR · sagittal · 3.0mm · 0.86mm/px · 6 of 15 slices shown]
[im 1/15]
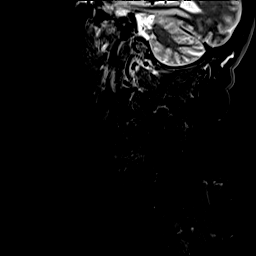
[im 3/15]
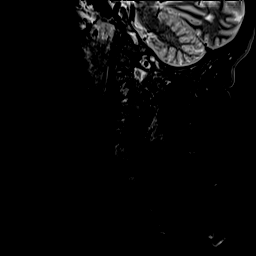
[im 6/15]
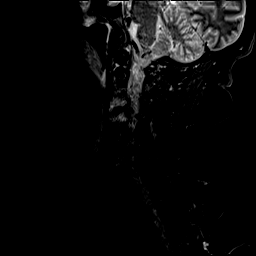
[im 9/15]
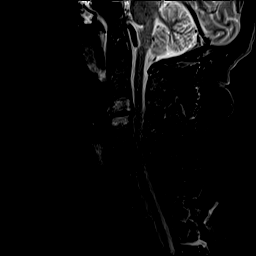
[im 12/15]
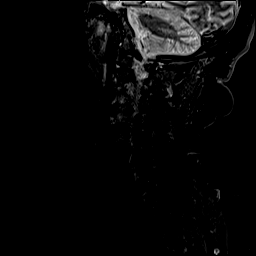
[im 15/15]
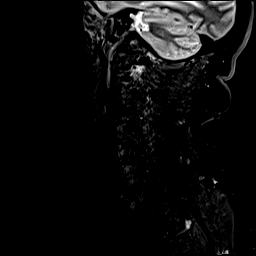

[Series 12: T2 · axial · 3.0mm · 0.66mm/px · z∈[-226,-107]mm · 11 of 38 slices shown (2 of 2)]
[im 1/38]
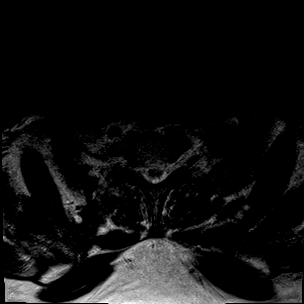
[im 3/38]
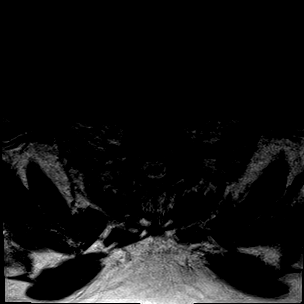
[im 6/38]
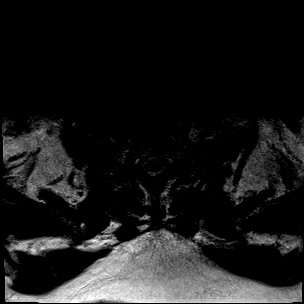
[im 8/38]
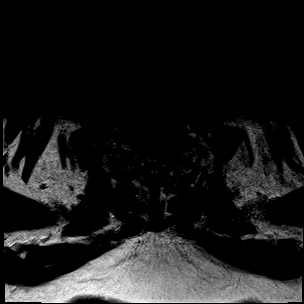
[im 11/38]
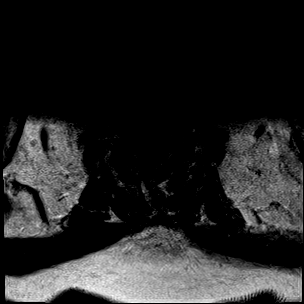
[im 16/38]
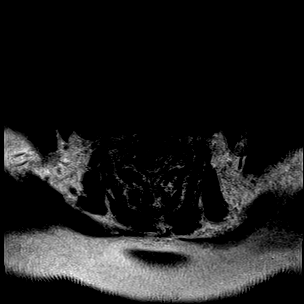
[im 19/38]
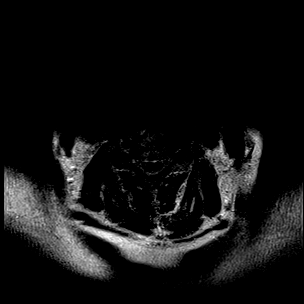
[im 22/38]
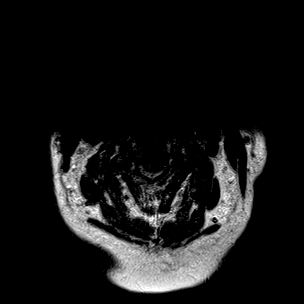
[im 27/38]
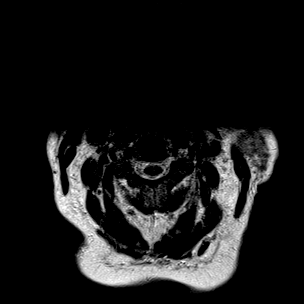
[im 32/38]
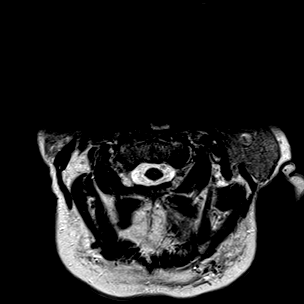
[im 38/38]
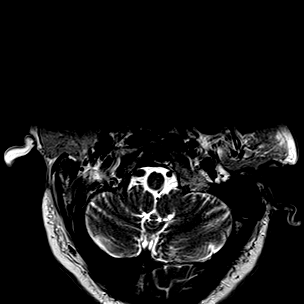

[Series 13: GRE · axial · 3.0mm · 0.39mm/px · z∈[-226,-107]mm · 8 of 38 slices shown]
[im 1/38]
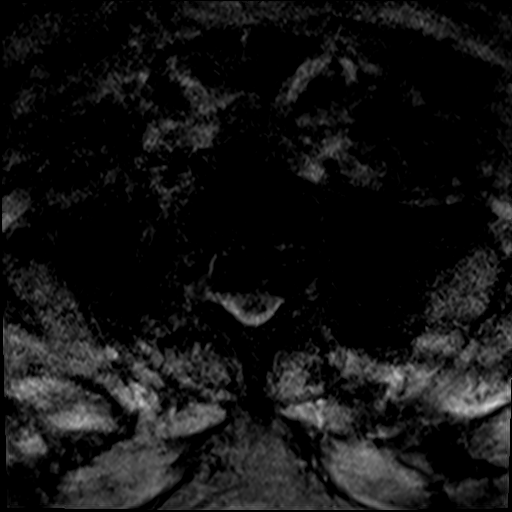
[im 6/38]
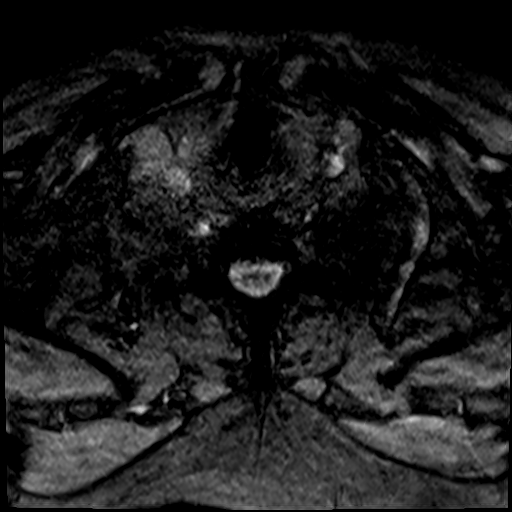
[im 11/38]
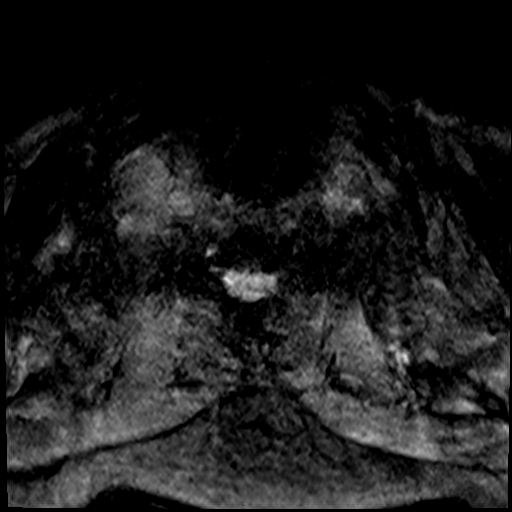
[im 16/38]
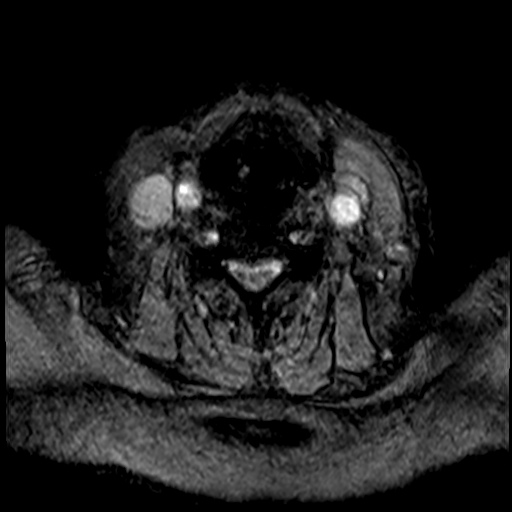
[im 22/38]
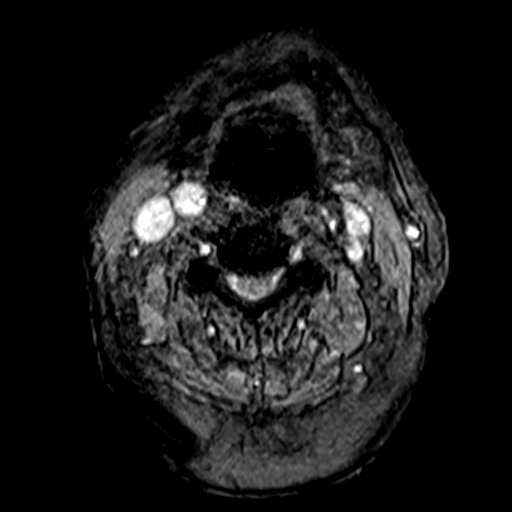
[im 27/38]
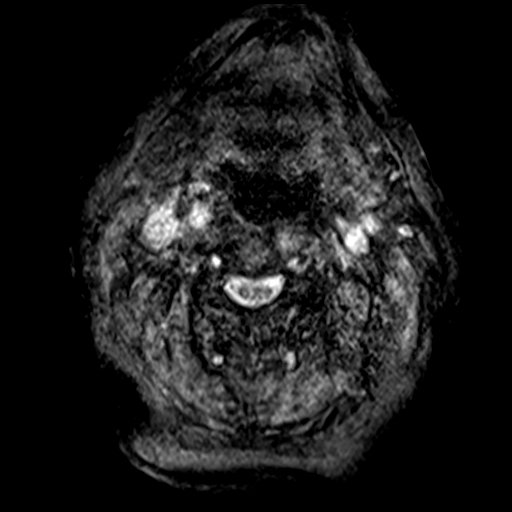
[im 32/38]
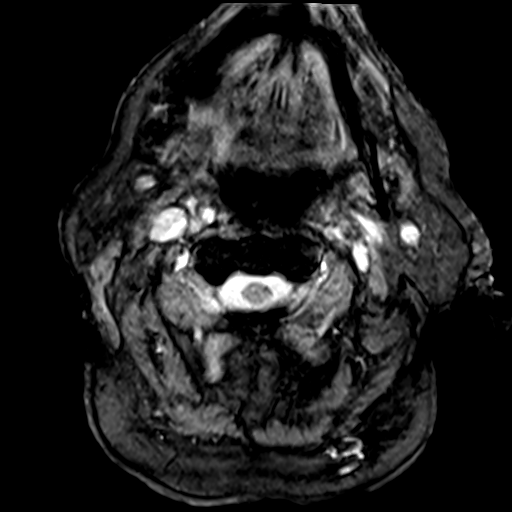
[im 38/38]
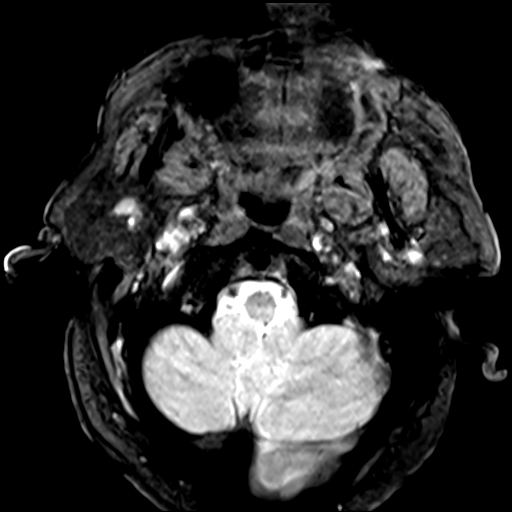

[37 of 48 positions shown; findings below may reference images not displayed]

FINDINGS: The examination is markedly motion degraded, particularly on the
axial images, which limits assessment of the spinal canal and neural
foramina.

Alignment: Normal.

Vertebrae: There are Modic type 1 degenerative endplate signal
changes at C3-4. No acute compression fracture or
discitis-osteomyelitis.

Cord: Normal caliber and signal.

Posterior Fossa, vertebral arteries, paraspinal tissues: Visualized
posterior fossa is normal. Vertebral artery flow voids are
preserved. No prevertebral effusion.

Disc levels: Sagittal imaging includes the atlantoaxial joint to the
level of the T3-4 disc space, with axial imaging of the disc spaces
from C2-3 to C7-T1.

C1-2: Normal.

C2-3: Normal.

C3-4: Small disc bulge with bilateral uncovertebral hypertrophy.
Mild spinal canal stenosis. Moderate bilateral neural foraminal
stenosis. Normal facets.

C4-5: Small disc bulge with normal facets. No central spinal canal
stenosis or neural foraminal stenosis.

C5-6: Markedly motion degraded. No high-grade spinal canal stenosis.

C6-7: Markedly motion degraded. No high-grade spinal canal stenosis.

C7-T1: Normal.

Sagittal imaging of the T1-T4 disc levels shows no spinal canal or
neural foraminal stenosis.
IMPRESSION: 1. Motion degraded examination, decreasing detailed assessment of
the spinal canal and neural foramina on the axial images.
2. C3-4 mild spinal canal and moderate bilateral neural foraminal
stenosis.
3. Otherwise, mild midcervical degenerative disc disease without
high-grade stenosis, degraded by motion.
